# Patient Record
Sex: Male | Born: 1950
Health system: Southern US, Community
[De-identification: ages and names within clinical notes are randomized; demographics above are authoritative.]

## PROBLEM LIST (undated history)

## (undated) DIAGNOSIS — K219 Gastro-esophageal reflux disease without esophagitis: Secondary | ICD-10-CM

## (undated) DIAGNOSIS — G629 Polyneuropathy, unspecified: Secondary | ICD-10-CM

## (undated) DIAGNOSIS — N189 Chronic kidney disease, unspecified: Secondary | ICD-10-CM

## (undated) DIAGNOSIS — C801 Malignant (primary) neoplasm, unspecified: Secondary | ICD-10-CM

## (undated) DIAGNOSIS — M1990 Unspecified osteoarthritis, unspecified site: Secondary | ICD-10-CM

## (undated) DIAGNOSIS — J449 Chronic obstructive pulmonary disease, unspecified: Secondary | ICD-10-CM

## (undated) DIAGNOSIS — M869 Osteomyelitis, unspecified: Secondary | ICD-10-CM

## (undated) DIAGNOSIS — S83511A Sprain of anterior cruciate ligament of right knee, initial encounter: Secondary | ICD-10-CM

## (undated) DIAGNOSIS — D649 Anemia, unspecified: Secondary | ICD-10-CM

## (undated) DIAGNOSIS — E7800 Pure hypercholesterolemia, unspecified: Secondary | ICD-10-CM

## (undated) DIAGNOSIS — C649 Malignant neoplasm of unspecified kidney, except renal pelvis: Secondary | ICD-10-CM

## (undated) DIAGNOSIS — M21372 Foot drop, left foot: Secondary | ICD-10-CM

## (undated) DIAGNOSIS — F419 Anxiety disorder, unspecified: Secondary | ICD-10-CM

## (undated) DIAGNOSIS — Z7189 Other specified counseling: Secondary | ICD-10-CM

## (undated) DIAGNOSIS — D4709 Other mast cell neoplasms of uncertain behavior: Secondary | ICD-10-CM

## (undated) DIAGNOSIS — C3491 Malignant neoplasm of unspecified part of right bronchus or lung: Secondary | ICD-10-CM

## (undated) DIAGNOSIS — R29898 Other symptoms and signs involving the musculoskeletal system: Secondary | ICD-10-CM

## (undated) DIAGNOSIS — I10 Essential (primary) hypertension: Secondary | ICD-10-CM

## (undated) DIAGNOSIS — C61 Malignant neoplasm of prostate: Secondary | ICD-10-CM

## (undated) DIAGNOSIS — J329 Chronic sinusitis, unspecified: Secondary | ICD-10-CM

## (undated) DIAGNOSIS — Z8719 Personal history of other diseases of the digestive system: Secondary | ICD-10-CM

## (undated) HISTORY — DX: Malignant neoplasm of prostate: C61

## (undated) HISTORY — DX: Malignant neoplasm of unspecified kidney, except renal pelvis: C64.9

## (undated) HISTORY — PX: OTHER SURGICAL HISTORY: SHX169

## (undated) HISTORY — PX: BACK SURGERY: SHX140

## (undated) HISTORY — DX: Other mast cell neoplasms of uncertain behavior: D47.09

## (undated) HISTORY — PX: EYE SURGERY: SHX253

## (undated) HISTORY — DX: Other specified counseling: Z71.89

## (undated) HISTORY — PX: PROSTATECTOMY: SHX69

## (undated) HISTORY — PX: VASECTOMY: SHX75

## (undated) HISTORY — PX: FRACTURE SURGERY: SHX138

## (undated) HISTORY — PX: UMBILICAL HERNIA REPAIR: SHX196

## (undated) HISTORY — PX: LEG SURGERY: SHX1003

## (undated) HISTORY — PX: NEPHRECTOMY RADICAL: SUR878

## (undated) HISTORY — DX: Malignant neoplasm of unspecified part of right bronchus or lung: C34.91

## (undated) HISTORY — PX: TYMPANOSTOMY TUBE PLACEMENT: SHX32

---

## 1993-07-30 HISTORY — PX: LUMBAR LAMINECTOMY: SHX95

## 1995-07-31 HISTORY — PX: OTHER SURGICAL HISTORY: SHX169

## 1998-01-05 ENCOUNTER — Encounter: Admission: RE | Admit: 1998-01-05 | Discharge: 1998-04-05 | Payer: Self-pay | Admitting: Internal Medicine

## 1998-04-20 ENCOUNTER — Encounter: Admission: RE | Admit: 1998-04-20 | Discharge: 1998-07-19 | Payer: Self-pay | Admitting: Internal Medicine

## 1998-08-02 ENCOUNTER — Encounter: Admission: RE | Admit: 1998-08-02 | Discharge: 1998-08-12 | Payer: Self-pay | Admitting: Internal Medicine

## 1998-08-12 ENCOUNTER — Encounter: Admission: RE | Admit: 1998-08-12 | Discharge: 1998-11-03 | Payer: Self-pay | Admitting: Internal Medicine

## 1998-11-22 ENCOUNTER — Encounter: Admission: RE | Admit: 1998-11-22 | Discharge: 1999-02-20 | Payer: Self-pay | Admitting: Internal Medicine

## 1999-03-01 ENCOUNTER — Encounter: Admission: RE | Admit: 1999-03-01 | Discharge: 1999-05-30 | Payer: Self-pay | Admitting: Internal Medicine

## 1999-06-07 ENCOUNTER — Encounter: Admission: RE | Admit: 1999-06-07 | Discharge: 1999-09-05 | Payer: Self-pay | Admitting: Internal Medicine

## 1999-06-25 ENCOUNTER — Emergency Department (HOSPITAL_COMMUNITY): Admission: EM | Admit: 1999-06-25 | Discharge: 1999-06-25 | Payer: Self-pay

## 1999-09-19 ENCOUNTER — Encounter: Admission: RE | Admit: 1999-09-19 | Discharge: 1999-12-18 | Payer: Self-pay | Admitting: Internal Medicine

## 2000-01-02 ENCOUNTER — Encounter: Admission: RE | Admit: 2000-01-02 | Discharge: 2000-04-01 | Payer: Self-pay | Admitting: Internal Medicine

## 2000-04-03 ENCOUNTER — Encounter: Admission: RE | Admit: 2000-04-03 | Discharge: 2000-07-02 | Payer: Self-pay | Admitting: Internal Medicine

## 2000-07-03 ENCOUNTER — Encounter: Admission: RE | Admit: 2000-07-03 | Discharge: 2000-08-07 | Payer: Self-pay | Admitting: Internal Medicine

## 2000-07-17 ENCOUNTER — Encounter (HOSPITAL_BASED_OUTPATIENT_CLINIC_OR_DEPARTMENT_OTHER): Payer: Self-pay | Admitting: Internal Medicine

## 2000-08-07 ENCOUNTER — Encounter: Admission: RE | Admit: 2000-08-07 | Discharge: 2000-11-04 | Payer: Self-pay | Admitting: *Deleted

## 2000-10-01 ENCOUNTER — Encounter (INDEPENDENT_AMBULATORY_CARE_PROVIDER_SITE_OTHER): Payer: Self-pay

## 2000-10-01 ENCOUNTER — Ambulatory Visit (HOSPITAL_COMMUNITY): Admission: RE | Admit: 2000-10-01 | Discharge: 2000-10-01 | Payer: Self-pay | Admitting: Gastroenterology

## 2000-11-27 ENCOUNTER — Encounter: Admission: RE | Admit: 2000-11-27 | Discharge: 2001-01-06 | Payer: Self-pay | Admitting: Internal Medicine

## 2000-12-31 ENCOUNTER — Encounter (HOSPITAL_BASED_OUTPATIENT_CLINIC_OR_DEPARTMENT_OTHER): Payer: Self-pay | Admitting: Internal Medicine

## 2001-01-07 ENCOUNTER — Ambulatory Visit (HOSPITAL_COMMUNITY): Admission: RE | Admit: 2001-01-07 | Discharge: 2001-01-07 | Payer: Self-pay | Admitting: Orthopedic Surgery

## 2001-01-07 ENCOUNTER — Encounter: Payer: Self-pay | Admitting: Orthopedic Surgery

## 2001-01-17 ENCOUNTER — Ambulatory Visit (HOSPITAL_COMMUNITY): Admission: RE | Admit: 2001-01-17 | Discharge: 2001-01-17 | Payer: Self-pay | Admitting: Orthopedic Surgery

## 2001-01-28 ENCOUNTER — Encounter: Payer: Self-pay | Admitting: Orthopedic Surgery

## 2001-01-29 ENCOUNTER — Encounter (INDEPENDENT_AMBULATORY_CARE_PROVIDER_SITE_OTHER): Payer: Self-pay | Admitting: Specialist

## 2001-01-29 ENCOUNTER — Observation Stay (HOSPITAL_COMMUNITY): Admission: RE | Admit: 2001-01-29 | Discharge: 2001-01-30 | Payer: Self-pay | Admitting: Orthopedic Surgery

## 2004-07-30 DIAGNOSIS — Z8719 Personal history of other diseases of the digestive system: Secondary | ICD-10-CM

## 2004-07-30 HISTORY — DX: Personal history of other diseases of the digestive system: Z87.19

## 2005-06-29 HISTORY — PX: APPENDECTOMY: SHX54

## 2005-07-01 ENCOUNTER — Inpatient Hospital Stay (HOSPITAL_COMMUNITY): Admission: EM | Admit: 2005-07-01 | Discharge: 2005-07-11 | Payer: Self-pay | Admitting: Emergency Medicine

## 2005-07-01 ENCOUNTER — Encounter (INDEPENDENT_AMBULATORY_CARE_PROVIDER_SITE_OTHER): Payer: Self-pay | Admitting: *Deleted

## 2006-06-10 ENCOUNTER — Ambulatory Visit (HOSPITAL_COMMUNITY): Admission: RE | Admit: 2006-06-10 | Discharge: 2006-06-10 | Payer: Self-pay | Admitting: Orthopedic Surgery

## 2006-07-10 ENCOUNTER — Encounter: Admission: RE | Admit: 2006-07-10 | Discharge: 2006-07-10 | Payer: Self-pay | Admitting: Orthopedic Surgery

## 2006-08-01 ENCOUNTER — Encounter: Admission: RE | Admit: 2006-08-01 | Discharge: 2006-08-01 | Payer: Self-pay | Admitting: Orthopedic Surgery

## 2006-11-28 LAB — HM COLONOSCOPY: HM Colonoscopy: NORMAL

## 2006-12-16 ENCOUNTER — Ambulatory Visit (HOSPITAL_COMMUNITY): Admission: RE | Admit: 2006-12-16 | Discharge: 2006-12-16 | Payer: Self-pay | Admitting: Gastroenterology

## 2007-06-30 ENCOUNTER — Encounter: Admission: RE | Admit: 2007-06-30 | Discharge: 2007-06-30 | Payer: Self-pay | Admitting: Orthopedic Surgery

## 2007-07-16 ENCOUNTER — Encounter: Admission: RE | Admit: 2007-07-16 | Discharge: 2007-07-16 | Payer: Self-pay | Admitting: Neurosurgery

## 2007-10-30 ENCOUNTER — Encounter: Admission: RE | Admit: 2007-10-30 | Discharge: 2007-10-30 | Payer: Self-pay | Admitting: Orthopedic Surgery

## 2008-04-19 ENCOUNTER — Encounter: Admission: RE | Admit: 2008-04-19 | Discharge: 2008-04-19 | Payer: Self-pay | Admitting: Orthopedic Surgery

## 2008-05-03 ENCOUNTER — Encounter: Admission: RE | Admit: 2008-05-03 | Discharge: 2008-05-03 | Payer: Self-pay | Admitting: Orthopedic Surgery

## 2008-07-27 ENCOUNTER — Encounter: Admission: RE | Admit: 2008-07-27 | Discharge: 2008-07-27 | Payer: Self-pay | Admitting: Orthopedic Surgery

## 2010-07-30 HISTORY — PX: MENISCUS REPAIR: SHX5179

## 2010-12-12 NOTE — Op Note (Signed)
NAMENYKEEM, CITRO                 ACCOUNT NO.:  [id number]   MEDICAL RECORD NO.:  [id number]          PATIENT TYPE:  AMB   LOCATION:  ENDO                         FACILITY:  [facility]   PHYSICIAN:  Petra Kuba, M.D.    DATE OF BIRTH:  [birth year]   DATE OF PROCEDURE:  12/16/2006  DATE OF DISCHARGE:                               OPERATIVE REPORT   PROCEDURE:  Colonoscopy   INDICATIONS:  Screening.  Consent was signed after risks, benefits,  methods, options thoroughly discussed multiple times in the past and  with my nurse recently.   MEDICINES USED:  Fentanyl 100 mcg, Versed 10 mg.   PROCEDURE:  Rectal inspection is pertinent for external hemorrhoids,  small.  Digital exam was negative.  The video pediatric colonoscope was  inserted and easily advanced around the colon to the cecum.  This did  not require any abdominal pressure or any positioning changes.  No  abnormality was seen on insertion.  Cecum was identified by the  appendiceal orifice and the ileocecal valve.  Scope was slowly  withdrawn.  The prep was adequate.  There was some liquid stool that  required washing and suctioning.  On slow withdrawal through the colon  no abnormalities were seen.  He did have some difficulty holding air, so  complete insufflation of the sigmoid was difficult but no abnormalities  were seen.  Once back in the rectum, anorectal pull-through and  retroflexion was normal except for some small hemorrhoids.  Scope was  straightened and readvanced a short ways up the left side of the colon.  Air was suctioned and scope removed.  The patient tolerated the  procedure well.  There was no obvious immediate complication.   ENDOSCOPIC DIAGNOSES:  1. Small internal and external hemorrhoids.  2. Otherwise within normal limits to the cecum.   PLAN:  Happy to see back p.r.n.  Repeat colon screening 5-10 years.  Will need to review his pathology from his first colon but I do not  think they were  adenomatous, but I do not have them available at the  time of this dictation to help [place] decide when he needs to be rescreened.           ______________________________  Petra Kuba, M.D.     MEM/MEDQ  D:  12/16/2006  T:  12/16/2006  Job:  [id number]

## 2010-12-15 NOTE — Procedures (Signed)
[facility]  Patient:    Taylor Elliott, Taylor Elliott                        MRN: [id number] Proc. Date: 10/01/00 Adm. Date:  [id number] Attending:  Nelda Marseille CC:         Lilyan Punt. Sydnee Levans, M.D.   Procedure Report  PROCEDURE:  Colonoscopy with biopsy.  INDICATIONS FOR PROCEDURE:  A patient with diarrhea, some bright red blood.  Consent was signed after risks, benefits, methods, and options were thoroughly discussed in the office.  MEDICINES USED:  Demerol 100, Versed 10.  DESCRIPTION OF PROCEDURE:  Rectal inspection is pertinent for very small external hemorrhoids. Digital exam was negative. The video colonoscope was inserted, easily advanced around the colon to the cecum. This did not require any abdominal pressure or any position changes. The cecum was identified by the appendiceal orifice and the ileocecal valve. The prep was adequate. No obvious abnormality was seen on insertion. The scope was inserted a short ways into the terminal ileum which was normal. Photo documentation was obtained. Scattered random biopsies of both the TI and the colon on withdrawal were obtained and put in separate containers. On slow withdrawal through the colon, no abnormalities were seen as we withdrew back to the distal sigmoid except for a tiny distal sigmoid probably hyperplastic appearing polyp and small rectal probably hyperplastic appearing polyp both of which were cold biopsied and put in a separate container. No other abnormalities were seen. There was some spasm on the left side, minimal liquid stool that required washing and suctioning but no other problems. Back in the rectum, we retroflexed pertinent for some small internal hemorrhoids. The scope was straightened and with suction the scope removed. The patient tolerated the procedure well and there was no obvious or immediate complication.  ENDOSCOPIC DIAGNOSIS: 1. Tiny to small internal/external  hemorrhoids. 2. Two rectal and distal sigmoid tiny questionable polyps status post cold    biopsy. 3. Otherwise within normal limits to the terminal ileum status post random    biopsies throughout.  PLAN:  Await pathology. Follow-up p.r.n. or in six weeks to recheck symptoms and decide any further workup plans like possibly an upper GI small bowel follow-through. Also await pathology on the polyps to determine future colonic screening. DD:  10/01/00 TD:  10/01/00 Job: [id number]

## 2010-12-15 NOTE — Op Note (Signed)
[facility]  Patient:    Taylor Elliott, Taylor Elliott                          MRN: [id number] Proc. Date: 01/29/01 Attending:  Nadara Mustard, M.D.                           Operative Report  PREOPERATIVE DIAGNOSES:  Osteomyelitis, left fifth metatarsal head with chronic Wagner grade 3 ulceration.  PROCEDURE:  Left fifth ray amputation.  SURGEON:  Dr. Lajoyce Corners.  ANESTHESIA:  General LMA.  ESTIMATED BLOOD LOSS:  Minimal.  ANTIBIOTICS:  1 gm of Kefzol.  TOURNIQUET TIME:  Esmarch at the ankle for approximately 20 minutes.  DISPOSITION:  To PACU in stable condition. Toe sent to pathology for identification.  INDICATIONS FOR PROCEDURE:  The patient is a 60 year old gentleman with a peripheral neuropathy secondary to spinal surgery as a child with a spinal angioma excision in 1958. The patient has had multiple foot procedures due to neuromuscular imbalance and has had a chronic ulceration over the left foot metatarsal head with infection of the bone. The patient has failed conservative care including p.o. antibiotics and wound debridement. Studies are positive for osteomyelitis in the left fifth metatarsal head. The patient presents at this time for left fifth ray amputation. The risks and benefits were discussed including infection, neurovascular injury, persistent infection, need for additional surgery, nonhealing of the wound. The patient states he understands and wishes to proceed at this time.  DESCRIPTION OF PROCEDURE:  The patient was brought to outpatient OR and underwent a general LMA anesthetic. After adequate levels of anesthesia obtained, the patients left lower extremity was prepped using duraprep and draped as a sterile field. A stockinette covered all exposed skin. A tourniquet was used with an Esmarch and the  Esmarch was wrapped around the ankle after elevation of the leg and the Esmarch was used for the tourniquet control. A racquet incision was made  laterally over the fifth metatarsal to include the Wagner grade 3 ulcer. The bone and ulcer and soft tissue were resected in one segment. The fifth metatarsal osteotomy was cut just distal to the insertion of the peroneal brevis and this was beveled plantarly to minimize risk of ulceration. The wound was irrigated with normal saline, there was no purulence. The tissue surrounding the bone was healthy. The Esmarch was released, hemostasis was obtained. The wound was closed using a vertical mattress in a far near, near far stitch. The wound was covered with Adaptic, orthopedic sponges, sterile Webril and a loosely wrapped Coban. The patient was extubated and taken to PACU in stable condition. Antibiotics and 1 gm of Kefzol preoperatively. Tourniquet time approximately 20 minutes with the Esmarch at the ankle. Discharged to PACU in stable condition and plan for 23 hour observation with IV antibiotics and then discharge to home, nonweightbearing, and follow-up in the office in one week. DD:  01/29/01 TD:  01/29/01 Job: [id number]

## 2010-12-15 NOTE — Op Note (Signed)
Taylor Elliott, Taylor Elliott                 ACCOUNT NO.:  [id number]   MEDICAL RECORD NO.:  [id number]          PATIENT TYPE:  INP   LOCATION:  0101                         FACILITY:  [facility]   PHYSICIAN:  Lebron Conners, M.D.   DATE OF BIRTH:  1950-11-25   DATE OF PROCEDURE:  07/01/2005  DATE OF DISCHARGE:                                 OPERATIVE REPORT   PREOPERATIVE DIAGNOSIS:  Acute appendicitis.   POSTOPERATIVE DIAGNOSIS:  Acute appendicitis with perforation.   OPERATION:  Laparoscopic appendectomy.   SURGEON:  Dr. Lebron Conners.   ANESTHESIA:  General and local.   SPECIMEN:  Appendix.   BLOOD LOSS:  Minimal.   COMPLICATIONS:  None.   CONDITION:  To PACU good.   DESCRIPTION OF PROCEDURE:  After the patient was monitored and anesthetized  and had a Foley catheter and routine preparation and draping of the abdomen,  I made a short vertical incision just below the umbilicus through a spot  which I thoroughly anesthetized with local anesthetic. I incised the fascia  in the midline for about 2 cm and then bluntly entered the peritoneal  cavity. I secured a Hassan cannula with a #0 Vicryl pursestring suture in  the fascia and inflated the abdomen with carbon dioxide. When I put in the  camera I could see evidence of inflammation in the right lower quadrant. I  then put in a 5 mm right upper quadrant port and a 11 mm lower midline port  under direct view assuring no visceral injury. I put these in through  anesthetized sites. I then retracted the colon medially and cleared adherent  omentum away from it and found inflamed appendix adherent to the lateral  pelvic wall. As I mobilized it, I was able to grasp it with a ratchet  grasper and elevate it and I could see that it was gangrenous and perforated  well away from the base. The inflammation was very well localized in that  area there being no detectable free fluid in the right gutter or pelvis. I  then dissected the mesentery,  dividing the fat with the cautery and I saw  the appendiceal artery and ligated it with three clips and cut between the  two closer to the appendix. I then stapled across the base of the appendix  where it appeared to be healthy utilizing the endoscopic cutting stapler and  it made a nice amputation of the appendix. I then irrigated the area,  thoroughly inspected the closure and was satisfied with it. I removed the  irrigant and was satisfied with hemostasis. I placed the appendix in a  plastic pouch and removed it  through the umbilical incision and tied the pursestring suture. I then  removed the right upper quadrant port under direct view and noted no  bleeding from the abdominal wall. I allowed the carbon dioxide to escape and  removed the lower midline port. I closed all skin incisions with  intracuticular 4-0 Vicryl and Steri-Strips.      Lebron Conners, M.D.  Electronically Signed     WB/MEDQ  D:  07/01/2005  T:  07/02/2005  Job:  [id number]

## 2010-12-15 NOTE — Discharge Summary (Signed)
Taylor Elliott, BUFANO                 ACCOUNT NO.:  [id number]   MEDICAL RECORD NO.:  [id number]          PATIENT TYPE:  INP   LOCATION:  1504                         FACILITY:  [facility]   PHYSICIAN:  Lebron Conners, M.D.   DATE OF BIRTH:  [birth year]   DATE OF ADMISSION:  07/01/2005  DATE OF DISCHARGE:  07/11/2005                                 DISCHARGE SUMMARY   HISTORY:  This is a 60 year old white male who presented at the emergency  department with abdominal pain which had first been central and then moved  to the right lower quadrant. He thought it was for about 2 days. He had also  had fever, nausea and vomiting and one episode of diarrhea.   PAST HISTORY:  Remarkable for an idiopathic neuropathy. 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  He is not diabetic. He has no other serious chronic problems. He smokes a  pack a day.   PHYSICAL EXAM:  Temperature was 101, pulse 117, respirations 25. Blood  pressure was normal. He was very tender in the right lower quadrant and was  slightly distended and belly was quiet. Except for a left fifth ray  amputation, the remainder of his exam was unremarkable. White count was  16,200, hemoglobin 14.9.   HOSPITAL COURSE:  The patient was felt to have acute appendicitis. I took  him to the operating room and found that he did have acute appendicitis with  perforation and localized rather severe inflammation but I was able to  perform a laparoscopic appendectomy. Postoperatively, he had slow return of  gastrointestinal function. White count was up for a short while and then  became normal. He had no obvious infectious complications. He did have  however vomiting which was quite remarkable and abdominal x-ray showing  ileus versus obstruction. His obstruction was never complete and he had some  associated diarrhea. His distension gradually resolved. A CT scan showed  partial obstruction in  picture. He was treated with some antibiotics and  those were stopped and his diarrhea improved. Clostridium difficile toxin  was negative. When the patient was tolerating a general diet without  vomiting and his abdomen was soft and bowels were under control, I sent home  with a prescription for Vicodin and arrangements to see me in 2 or 3 weeks  or as necessary.   DIAGNOSIS:  1.  Acute appendicitis with perforation and localized abscess.  2.  Postoperative small intestinal obstruction, improved.  3.  Postoperative diarrhea, improved.  4.  Idiopathic peripheral neuropathy.  5.  Tobacco abuse.   OPERATION:  Laparoscopic appendectomy.   DISCHARGE CONDITION:  Stable and improving.      Lebron Conners, M.D.  Electronically Signed     WB/MEDQ  D:  07/17/2005  T:  07/19/2005  Job:  [id number]

## 2011-04-17 ENCOUNTER — Ambulatory Visit
Admission: RE | Admit: 2011-04-17 | Discharge: 2011-04-17 | Disposition: A | Discharge status: Home / Self Care | Payer: Commercial Managed Care - PPO | Source: Ambulatory Visit | Attending: Orthopedic Surgery | Admitting: Orthopedic Surgery

## 2011-04-17 ENCOUNTER — Other Ambulatory Visit (HOSPITAL_COMMUNITY): Payer: Self-pay | Admitting: Orthopedic Surgery

## 2011-04-17 DIAGNOSIS — M25561 Pain in right knee: Secondary | ICD-10-CM

## 2011-04-18 ENCOUNTER — Other Ambulatory Visit: Payer: Self-pay | Admitting: Orthopedic Surgery

## 2011-04-18 DIAGNOSIS — M25561 Pain in right knee: Secondary | ICD-10-CM

## 2011-04-23 ENCOUNTER — Encounter (HOSPITAL_COMMUNITY)
Admission: RE | Admit: 2011-04-23 | Discharge: 2011-04-23 | Disposition: A | Discharge status: Home / Self Care | Payer: No Typology Code available for payment source | Source: Ambulatory Visit | Attending: Orthopedic Surgery | Admitting: Orthopedic Surgery

## 2011-04-23 ENCOUNTER — Other Ambulatory Visit (HOSPITAL_COMMUNITY): Payer: Self-pay | Admitting: Orthopedic Surgery

## 2011-04-23 DIAGNOSIS — S83209A Unspecified tear of unspecified meniscus, current injury, unspecified knee, initial encounter: Secondary | ICD-10-CM

## 2011-04-23 LAB — COMPREHENSIVE METABOLIC PANEL
ALT: 30 U/L (ref 0–53)
AST: 19 U/L (ref 0–37)
Albumin: 3.5 g/dL (ref 3.5–5.2)
Alkaline Phosphatase: 90 U/L (ref 39–117)
BUN: 16 mg/dL (ref 6–23)
CO2: 26 mEq/L (ref 19–32)
Calcium: 9.7 mg/dL (ref 8.4–10.5)
Chloride: 105 mEq/L (ref 96–112)
Creatinine, Ser: 0.81 mg/dL (ref 0.50–1.35)
GFR calc Af Amer: >60 mL/min (ref >60)
GFR calc non Af Amer: >60 mL/min (ref >60)
Glucose, Bld: 112 mg/dL — ABNORMAL HIGH (ref 70–99)
Potassium: 5.6 mEq/L — ABNORMAL HIGH (ref 3.5–5.1)
Sodium: 142 mEq/L (ref 135–145)
Total Bilirubin: 0.3 mg/dL (ref 0.3–1.2)
Total Protein: 6.4 g/dL (ref 6.0–8.3)

## 2011-04-23 LAB — PROTIME-INR
INR: 0.95 (ref 0.00–1.49)
Prothrombin Time: 12.9 seconds (ref 11.6–15.2)

## 2011-04-23 LAB — CBC
HCT: 41.7 % (ref 39.0–52.0)
Hemoglobin: 14.4 g/dL (ref 13.0–17.0)
MCH: 32.7 pg (ref 26.0–34.0)
MCHC: 34.5 g/dL (ref 30.0–36.0)
MCV: 94.6 fL (ref 78.0–100.0)
Platelets: 209 10*3/uL (ref 150–400)
RBC: 4.41 MIL/uL (ref 4.22–5.81)
RDW: 14.1 % (ref 11.5–15.5)
WBC: 8.1 10*3/uL (ref 4.0–10.5)

## 2011-04-23 LAB — APTT: aPTT: 30 seconds (ref 24–37)

## 2011-04-23 LAB — SURGICAL PCR SCREEN
MRSA, PCR: NEGATIVE
Staphylococcus aureus: NEGATIVE

## 2011-04-25 ENCOUNTER — Ambulatory Visit (HOSPITAL_COMMUNITY)
Admission: RE | Admit: 2011-04-25 | Discharge: 2011-04-25 | Disposition: A | Discharge status: Home / Self Care | Payer: No Typology Code available for payment source | Source: Ambulatory Visit | Attending: Orthopedic Surgery | Admitting: Orthopedic Surgery

## 2011-04-25 DIAGNOSIS — F17200 Nicotine dependence, unspecified, uncomplicated: Secondary | ICD-10-CM | POA: Insufficient documentation

## 2011-04-25 DIAGNOSIS — M545 Low back pain, unspecified: Secondary | ICD-10-CM | POA: Insufficient documentation

## 2011-04-25 DIAGNOSIS — S83509A Sprain of unspecified cruciate ligament of unspecified knee, initial encounter: Secondary | ICD-10-CM | POA: Insufficient documentation

## 2011-04-25 DIAGNOSIS — Z01818 Encounter for other preprocedural examination: Secondary | ICD-10-CM | POA: Insufficient documentation

## 2011-04-25 DIAGNOSIS — S83289A Other tear of lateral meniscus, current injury, unspecified knee, initial encounter: Secondary | ICD-10-CM | POA: Insufficient documentation

## 2011-04-25 DIAGNOSIS — K219 Gastro-esophageal reflux disease without esophagitis: Secondary | ICD-10-CM | POA: Insufficient documentation

## 2011-04-25 DIAGNOSIS — Y998 Other external cause status: Secondary | ICD-10-CM | POA: Insufficient documentation

## 2011-04-25 DIAGNOSIS — I10 Essential (primary) hypertension: Secondary | ICD-10-CM | POA: Insufficient documentation

## 2011-04-25 DIAGNOSIS — Z01812 Encounter for preprocedural laboratory examination: Secondary | ICD-10-CM | POA: Insufficient documentation

## 2011-04-25 LAB — BASIC METABOLIC PANEL
BUN: 13 mg/dL (ref 6–23)
CO2: 30 mEq/L (ref 19–32)
Calcium: 10.3 mg/dL (ref 8.4–10.5)
Chloride: 100 mEq/L (ref 96–112)
Creatinine, Ser: 0.79 mg/dL (ref 0.50–1.35)
GFR calc Af Amer: >60 mL/min (ref >60)
GFR calc non Af Amer: >60 mL/min (ref >60)
Glucose, Bld: 125 mg/dL — ABNORMAL HIGH (ref 70–99)
Potassium: 5.7 mEq/L — ABNORMAL HIGH (ref 3.5–5.1)
Sodium: 140 mEq/L (ref 135–145)

## 2011-05-15 ENCOUNTER — Ambulatory Visit: Payer: No Typology Code available for payment source | Attending: Orthopedic Surgery | Admitting: Physical Therapy

## 2011-05-15 DIAGNOSIS — IMO0001 Reserved for inherently not codable concepts without codable children: Secondary | ICD-10-CM | POA: Insufficient documentation

## 2011-05-15 DIAGNOSIS — M25569 Pain in unspecified knee: Secondary | ICD-10-CM | POA: Insufficient documentation

## 2011-05-15 DIAGNOSIS — M25669 Stiffness of unspecified knee, not elsewhere classified: Secondary | ICD-10-CM | POA: Insufficient documentation

## 2011-05-15 DIAGNOSIS — R262 Difficulty in walking, not elsewhere classified: Secondary | ICD-10-CM | POA: Insufficient documentation
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
## 2011-05-24 ENCOUNTER — Ambulatory Visit: Payer: No Typology Code available for payment source | Admitting: Physical Therapy

## 2011-06-01 ENCOUNTER — Ambulatory Visit: Payer: No Typology Code available for payment source | Attending: Orthopedic Surgery | Admitting: Physical Therapy

## 2011-06-01 DIAGNOSIS — M25569 Pain in unspecified knee: Secondary | ICD-10-CM | POA: Insufficient documentation

## 2011-06-01 DIAGNOSIS — IMO0001 Reserved for inherently not codable concepts without codable children: Secondary | ICD-10-CM | POA: Insufficient documentation

## 2011-06-01 DIAGNOSIS — R262 Difficulty in walking, not elsewhere classified: Secondary | ICD-10-CM | POA: Insufficient documentation

## 2011-06-01 DIAGNOSIS — M25669 Stiffness of unspecified knee, not elsewhere classified: Secondary | ICD-10-CM | POA: Insufficient documentation

## 2011-10-17 HISTORY — PX: NM MYOCAR PERF EJECTION FRACTION: HXRAD630

## 2012-07-24 DIAGNOSIS — M109 Gout, unspecified: Secondary | ICD-10-CM | POA: Insufficient documentation

## 2013-01-08 ENCOUNTER — Other Ambulatory Visit: Payer: Self-pay | Admitting: *Deleted

## 2013-01-08 MED ORDER — HYDROCHLOROTHIAZIDE 25 MG PO TABS: 25.0000 mg | ORAL_TABLET | Freq: Every day | ORAL | Status: DC

## 2013-06-12 DIAGNOSIS — G609 Hereditary and idiopathic neuropathy, unspecified: Secondary | ICD-10-CM | POA: Insufficient documentation

## 2013-06-12 DIAGNOSIS — I10 Essential (primary) hypertension: Secondary | ICD-10-CM | POA: Insufficient documentation

## 2013-06-12 DIAGNOSIS — N529 Male erectile dysfunction, unspecified: Secondary | ICD-10-CM | POA: Insufficient documentation

## 2013-06-12 DIAGNOSIS — E7800 Pure hypercholesterolemia, unspecified: Secondary | ICD-10-CM | POA: Insufficient documentation

## 2013-07-14 ENCOUNTER — Other Ambulatory Visit (HOSPITAL_COMMUNITY): Payer: Self-pay | Admitting: Urology

## 2013-07-14 DIAGNOSIS — C61 Malignant neoplasm of prostate: Secondary | ICD-10-CM

## 2013-07-15 ENCOUNTER — Other Ambulatory Visit: Payer: Self-pay | Admitting: Urology

## 2013-08-17 ENCOUNTER — Encounter (INDEPENDENT_AMBULATORY_CARE_PROVIDER_SITE_OTHER): Payer: Self-pay

## 2013-08-17 ENCOUNTER — Encounter (HOSPITAL_COMMUNITY): Payer: Self-pay

## 2013-08-17 ENCOUNTER — Encounter (HOSPITAL_COMMUNITY)
Admission: RE | Admit: 2013-08-17 | Discharge: 2013-08-17 | Disposition: A | Discharge status: Home / Self Care | Payer: 59 | Source: Ambulatory Visit | Attending: Urology | Admitting: Urology

## 2013-08-17 ENCOUNTER — Encounter (HOSPITAL_COMMUNITY): Payer: Self-pay | Admitting: Pharmacy Technician

## 2013-08-17 HISTORY — DX: Polyneuropathy, unspecified: G62.9

## 2013-08-17 HISTORY — DX: Essential (primary) hypertension: I10

## 2013-08-17 HISTORY — DX: Unspecified osteoarthritis, unspecified site: M19.90

## 2013-08-17 HISTORY — DX: Malignant (primary) neoplasm, unspecified: C80.1

## 2013-08-17 HISTORY — DX: Other symptoms and signs involving the musculoskeletal system: R29.898

## 2013-08-17 HISTORY — DX: Pure hypercholesterolemia, unspecified: E78.00

## 2013-08-17 HISTORY — DX: Gastro-esophageal reflux disease without esophagitis: K21.9

## 2013-08-17 HISTORY — DX: Personal history of other diseases of the digestive system: Z87.19

## 2013-08-17 HISTORY — DX: Sprain of anterior cruciate ligament of right knee, initial encounter: S83.511A

## 2013-08-17 HISTORY — DX: Foot drop, left foot: M21.372

## 2013-08-17 LAB — BASIC METABOLIC PANEL
BUN: 11 mg/dL (ref 6–23)
CO2: 27 mEq/L (ref 19–32)
Calcium: 9.9 mg/dL (ref 8.4–10.5)
Chloride: 98 mEq/L (ref 96–112)
Creatinine, Ser: 0.9 mg/dL (ref 0.50–1.35)
GFR calc Af Amer: >90 mL/min (ref >90)
GFR calc non Af Amer: >90 mL/min (ref >90)
Glucose, Bld: 110 mg/dL — ABNORMAL HIGH (ref 70–99)
Potassium: 5.1 mEq/L (ref 3.7–5.3)
Sodium: 138 mEq/L (ref 137–147)

## 2013-08-17 LAB — CBC
HCT: 46 % (ref 39.0–52.0)
Hemoglobin: 15.8 g/dL (ref 13.0–17.0)
MCH: 31.9 pg (ref 26.0–34.0)
MCHC: 34.3 g/dL (ref 30.0–36.0)
MCV: 92.9 fL (ref 78.0–100.0)
Platelets: 224 10*3/uL (ref 150–400)
RBC: 4.95 MIL/uL (ref 4.22–5.81)
RDW: 13.2 % (ref 11.5–15.5)
WBC: 8.6 10*3/uL (ref 4.0–10.5)

## 2013-08-17 NOTE — Patient Instructions (Addendum)
[place]  08/17/2013   Your procedure is scheduled on: 08/27/13  Report to [facility] at 5:15 AM.  Call this number if you have problems the morning of surgery [phone]: [phone]   Remember: please follow bowel prep instructions   Do not eat food or drink liquids After Midnight.     Take these medicines the morning of surgery with A SIP OF WATER: crestor, allopurinol   Do not wear jewelry, make-up or nail polish.  Do not wear lotions, powders, or perfumes. You may wear deodorant.  Do not shave 48 hours prior to surgery. Men may shave face and neck.  Do not bring valuables to the hospital.  Contacts, dentures or bridgework may not be worn into surgery.  Leave suitcase in the car. After surgery it may be brought to your room.  For patients admitted to the hospital, checkout time is 11:00 AM the day of discharge.    Please read over the following fact sheets that you were given: blood fact sheet, incentive spirometry fact sheet Paulette Blanch, RN  pre op nurse call if needed [phone]    FAILURE TO [place]   Patient Signature: ___________________________________________

## 2013-08-17 NOTE — Progress Notes (Signed)
08/17/13 0852  OBSTRUCTIVE SLEEP APNEA  Have you ever been diagnosed with sleep apnea through a sleep study? No  Do you snore loudly (loud enough to be heard through closed doors)?  1  Do you often feel tired, fatigued, or sleepy during the daytime? 0  Has anyone observed you stop breathing during your sleep? 0  Do you have, or are you being treated for high blood pressure? 1  BMI more than 35 kg/m2? 0  Age over 62 years old? 1  Neck circumference greater than 40 cm/18 inches? 0  Gender: 1  Obstructive Sleep Apnea Score 4  Score 4 or greater  Results sent to PCP

## 2013-08-17 NOTE — Progress Notes (Signed)
LOV note Dr. Ellyn Hack 11/05/12 on chart, EKG 11/04/12 on chart, stress test 2013 on chart, LOV note 06/12/13 Dr. Coletta Memos on chart, Chest x-ray 06/16/13 on chart

## 2013-08-19 ENCOUNTER — Ambulatory Visit (HOSPITAL_COMMUNITY)
Admission: RE | Admit: 2013-08-19 | Discharge: 2013-08-19 | Disposition: A | Discharge status: Home / Self Care | Payer: 59 | Source: Ambulatory Visit | Attending: Urology | Admitting: Urology

## 2013-08-19 DIAGNOSIS — R972 Elevated prostate specific antigen [PSA]: Secondary | ICD-10-CM | POA: Insufficient documentation

## 2013-08-19 DIAGNOSIS — N402 Nodular prostate without lower urinary tract symptoms: Secondary | ICD-10-CM | POA: Insufficient documentation

## 2013-08-19 DIAGNOSIS — C61 Malignant neoplasm of prostate: Secondary | ICD-10-CM | POA: Insufficient documentation

## 2013-08-19 LAB — POCT I-STAT CREATININE: Creatinine, Ser: 1 mg/dL (ref 0.50–1.35)

## 2013-08-19 MED ORDER — GADOBENATE DIMEGLUMINE 529 MG/ML IV SOLN
19.0000 mL | Freq: Once | INTRAVENOUS | Status: AC | PRN
  Administered 2013-08-19: 19 mL via INTRAVENOUS

## 2013-08-24 ENCOUNTER — Other Ambulatory Visit (HOSPITAL_COMMUNITY): Payer: 59

## 2013-08-26 NOTE — H&P (Signed)
Chief Complaint Prostate Cancer    History of Present Illness Taylor Elliott is a 63 year old who was noted to have an elevated PSA of 4.12 and a right mid prostate nodule. He underwent a prostate needle biopsy on 07/02/13 which confirmed Gleason 3+4=7 adenocarcinoma of the prostate with 10 out of 12 biopsy cores positive for malignancy. He has no family history of prostate cancer. He is well informed about his treatment options through his prior discussions with Dr. Diona Fanti. He is most interested in proceeding with surgical treatment.  His past surgical history is significant for an appendectomy.  TNM stage: cT2a Nx Mx (R mid nodule) PSA: 4.12 Gleason score: 3+4=7 Prostate biopsy (07/02/13): 10/12 cores positive    Left: L lateral apex (60%, 3+4=7), L lateral mid (70%, 3+4=7), L mid (40%, 3+3=6), L lateral base (40%, 3+3=6)    Right: R apex (< 5%, 3+3=6), R lateral apex (50%, 3+4=7), R mid (70%, 3+4=7), R lateral mid (80%, 3+3=6), R base (50%, 3+4=7), R lateral base (70%, 3+4=7)  Prostate volume: 15 cc   Nomogram OC disease: 74% EPE: 29% SVI: 1% LNI: 2.2% PFS (surgery): 90% at 5 years, 86% at 10 years  Urinary function: He has moderate voiding symptoms including frequency, intermittency, and urgency. IPSS is 8. Erectile function: He has very mild erectile dysfunction which has not required treatment. SHIM score is 21. He does have issues related to a low libido which has been somewhat long-standing.   Past Medical History Problems  1. History of Anxiety (300.00) 2. History of esophageal reflux (V12.79) 3. History of gout (V12.29) 4. History of hypertension (V12.59)  Surgical History Problems  1. History of Appendectomy 2. History of Back Surgery 3. Encounter for contraceptive planning (V25.09) 4. History of Foot Surgery 5. History of Knee Arthroscopy With Medial Meniscus Repair 6. History of Leg Repair 7. History of Surgery Of Male Genitalia Vasectomy  Current Meds 1.  Allopurinol 100 MG Oral Tablet;  Therapy: (Recorded:21Nov2014) to Recorded 2. Colcrys 0.6 MG Oral Tablet;  Therapy: (Recorded:21Nov2014) to Recorded 3. Crestor 10 MG Oral Tablet;  Therapy: (Recorded:21Nov2014) to Recorded 4. Imodium CAPS;  Therapy: (Recorded:21Nov2014) to Recorded 5. Levofloxacin 500 MG Oral Tablet; take one tab by mouth the day before procedure, take  one tab by mouth the day of procedure, take one tab by mouth the after the procedure;  Therapy: [id number] to (Last Rx:21Nov2014)  Requested for: 21Nov2014 Ordered 6. Lisinopril 20 MG Oral Tablet;  Therapy: (Recorded:21Nov2014) to Recorded 7. L-Methylfolate-B6-B12 TABS;  Therapy: (Recorded:21Nov2014) to Recorded 8. Lovaza 1 GM Oral Capsule;  Therapy: (Recorded:21Nov2014) to Recorded 9. Multi-Vitamin TABS;  Therapy: (Recorded:20May2009) to Recorded 10. Tums CHEW;   Therapy: (Recorded:21Nov2014) to Recorded 11. Vicodin TABS; prn;   Therapy: (Recorded:21Nov2014) to Recorded  Allergies Medication  1. No Known Drug Allergies Non-Medication  2. Bee sting  Family History Problems  1. Family history of Death In The Family Mother : Mother   died age 96-lung cancer 2. Family history of Family Health Status Number Of Children   1 boy and 1 girl 3. Family history of Nephrolithiasis : Father  Social History Problems    Alcohol Use   3 beers/day   Current every day smoker (305.1)   smoke 1 ppd   Marital History - Currently Married   Occupation:   self employed-sales  Review of Systems Constitutional, skin, eye, otolaryngeal, hematologic/lymphatic, cardiovascular, pulmonary, endocrine, musculoskeletal, gastrointestinal, neurological and psychiatric system(s) were reviewed and pertinent findings if present are noted.  Vitals  Weight: 190 lb  BMI Calculated: 28.89 BSA Calculated: 2   Physical Exam Constitutional: Well nourished and well developed . No acute distress.  ENT:. The ears and nose are  normal in appearance.  Neck: The appearance of the neck is normal and no neck mass is present.  Pulmonary: No respiratory distress, normal respiratory rhythm and effort and clear bilateral breath sounds.  Cardiovascular: Heart rate and rhythm are normal . No peripheral edema.  Abdomen: right lower quadrant incision site(s) well healed. The abdomen is soft and nontender. No masses are palpated. No CVA tenderness. No hernias are palpable. No hepatosplenomegaly noted.      Assessment Assessed  1. Adenocarcinoma of prostate (185)   Discussion/Summary 1. Prostate cancer:    He has elected to proceed with surgical therapy and will undergo a non nerve sparing robotic-assisted laparoscopic radical prostatectomy and bilateral pelvic lymphadenectomy.

## 2013-08-27 ENCOUNTER — Inpatient Hospital Stay (HOSPITAL_COMMUNITY): Payer: 59 | Admitting: Anesthesiology

## 2013-08-27 ENCOUNTER — Encounter (HOSPITAL_COMMUNITY): Payer: 59 | Admitting: Anesthesiology

## 2013-08-27 ENCOUNTER — Inpatient Hospital Stay (HOSPITAL_COMMUNITY)
Admission: RE | Admit: 2013-08-27 | Discharge: 2013-08-28 | DRG: 708 | Disposition: A | Discharge status: Home / Self Care | Payer: 59 | Source: Ambulatory Visit | Attending: Urology | Admitting: Urology

## 2013-08-27 ENCOUNTER — Encounter (HOSPITAL_COMMUNITY): Payer: Self-pay | Admitting: *Deleted

## 2013-08-27 ENCOUNTER — Encounter (HOSPITAL_COMMUNITY)
Admission: RE | Disposition: A | Discharge status: Home / Self Care | Payer: Self-pay | Source: Ambulatory Visit | Attending: Urology

## 2013-08-27 DIAGNOSIS — R29898 Other symptoms and signs involving the musculoskeletal system: Secondary | ICD-10-CM | POA: Diagnosis not present

## 2013-08-27 DIAGNOSIS — I10 Essential (primary) hypertension: Secondary | ICD-10-CM | POA: Diagnosis present

## 2013-08-27 DIAGNOSIS — F17200 Nicotine dependence, unspecified, uncomplicated: Secondary | ICD-10-CM | POA: Diagnosis present

## 2013-08-27 DIAGNOSIS — K219 Gastro-esophageal reflux disease without esophagitis: Secondary | ICD-10-CM | POA: Diagnosis present

## 2013-08-27 DIAGNOSIS — Z9089 Acquired absence of other organs: Secondary | ICD-10-CM

## 2013-08-27 DIAGNOSIS — R262 Difficulty in walking, not elsewhere classified: Secondary | ICD-10-CM | POA: Diagnosis not present

## 2013-08-27 DIAGNOSIS — Z801 Family history of malignant neoplasm of trachea, bronchus and lung: Secondary | ICD-10-CM

## 2013-08-27 DIAGNOSIS — Z01812 Encounter for preprocedural laboratory examination: Secondary | ICD-10-CM

## 2013-08-27 DIAGNOSIS — G5780 Other specified mononeuropathies of unspecified lower limb: Secondary | ICD-10-CM | POA: Diagnosis not present

## 2013-08-27 DIAGNOSIS — M109 Gout, unspecified: Secondary | ICD-10-CM | POA: Diagnosis present

## 2013-08-27 DIAGNOSIS — R6882 Decreased libido: Secondary | ICD-10-CM | POA: Diagnosis present

## 2013-08-27 DIAGNOSIS — N529 Male erectile dysfunction, unspecified: Secondary | ICD-10-CM | POA: Diagnosis present

## 2013-08-27 DIAGNOSIS — F411 Generalized anxiety disorder: Secondary | ICD-10-CM | POA: Diagnosis present

## 2013-08-27 DIAGNOSIS — C61 Malignant neoplasm of prostate: Principal | ICD-10-CM | POA: Diagnosis present

## 2013-08-27 HISTORY — PX: ROBOT ASSISTED LAPAROSCOPIC RADICAL PROSTATECTOMY: SHX5141

## 2013-08-27 HISTORY — PX: LYMPHADENECTOMY: SHX5960

## 2013-08-27 LAB — TYPE AND SCREEN
ABO/RH(D): A POS
Antibody Screen: NEGATIVE

## 2013-08-27 LAB — HEMOGLOBIN AND HEMATOCRIT, BLOOD
HCT: 41.1 % (ref 39.0–52.0)
Hemoglobin: 13.8 g/dL (ref 13.0–17.0)

## 2013-08-27 LAB — ABO/RH: ABO/RH(D): A POS

## 2013-08-27 SURGERY — ROBOTIC ASSISTED LAPAROSCOPIC RADICAL PROSTATECTOMY LEVEL 2
Anesthesia: General

## 2013-08-27 MED ORDER — ALBUTEROL SULFATE HFA 108 (90 BASE) MCG/ACT IN AERS
INHALATION_SPRAY | RESPIRATORY_TRACT | Status: DC | PRN
  Administered 2013-08-27: 2 via RESPIRATORY_TRACT
  Administered 2013-08-27: 3 via RESPIRATORY_TRACT

## 2013-08-27 MED ORDER — KCL IN DEXTROSE-NACL 20-5-0.45 MEQ/L-%-% IV SOLN
INTRAVENOUS | Status: DC
Start: 2013-08-27 — End: 2013-08-28
  Administered 2013-08-27 – 2013-08-28 (×3): via INTRAVENOUS
  Filled 2013-08-27 (×5): qty 1000

## 2013-08-27 MED ORDER — HYDROMORPHONE HCL PF 1 MG/ML IJ SOLN
INTRAMUSCULAR | Status: AC
  Filled 2013-08-27: qty 1

## 2013-08-27 MED ORDER — HYDROMORPHONE HCL PF 1 MG/ML IJ SOLN
INTRAMUSCULAR | Status: DC | PRN
  Administered 2013-08-27 (×2): 1 mg via INTRAVENOUS

## 2013-08-27 MED ORDER — HYDROCODONE-ACETAMINOPHEN 5-325 MG PO TABS: 1.0000 | ORAL_TABLET | Freq: Four times a day (QID) | ORAL | Status: DC | PRN

## 2013-08-27 MED ORDER — GLYCOPYRROLATE 0.2 MG/ML IJ SOLN
INTRAMUSCULAR | Status: AC
  Filled 2013-08-27: qty 1

## 2013-08-27 MED ORDER — SUCCINYLCHOLINE CHLORIDE 20 MG/ML IJ SOLN
INTRAMUSCULAR | Status: DC | PRN
  Administered 2013-08-27: 100 mg via INTRAVENOUS

## 2013-08-27 MED ORDER — SUFENTANIL CITRATE 50 MCG/ML IV SOLN
INTRAVENOUS | Status: AC
  Filled 2013-08-27: qty 1

## 2013-08-27 MED ORDER — DEXAMETHASONE SODIUM PHOSPHATE 10 MG/ML IJ SOLN
INTRAMUSCULAR | Status: AC
  Filled 2013-08-27: qty 1

## 2013-08-27 MED ORDER — SUFENTANIL CITRATE 50 MCG/ML IV SOLN
INTRAVENOUS | Status: DC | PRN
  Administered 2013-08-27: 5 ug via INTRAVENOUS
  Administered 2013-08-27: 15 ug via INTRAVENOUS
  Administered 2013-08-27: 5 ug via INTRAVENOUS
  Administered 2013-08-27: 10 ug via INTRAVENOUS
  Administered 2013-08-27 (×2): 5 ug via INTRAVENOUS
  Administered 2013-08-27 (×2): 10 ug via INTRAVENOUS
  Administered 2013-08-27: 5 ug via INTRAVENOUS
  Administered 2013-08-27: 10 ug via INTRAVENOUS

## 2013-08-27 MED ORDER — ONDANSETRON HCL 4 MG/2ML IJ SOLN
INTRAMUSCULAR | Status: AC
  Filled 2013-08-27: qty 2

## 2013-08-27 MED ORDER — ONDANSETRON HCL 4 MG/2ML IJ SOLN
4.0000 mg | INTRAMUSCULAR | Status: DC | PRN
  Administered 2013-08-27 – 2013-08-28 (×2): 4 mg via INTRAVENOUS
  Filled 2013-08-27: qty 2

## 2013-08-27 MED ORDER — MIDAZOLAM HCL 2 MG/2ML IJ SOLN
INTRAMUSCULAR | Status: AC
  Filled 2013-08-27: qty 2

## 2013-08-27 MED ORDER — LACTATED RINGERS IV SOLN
INTRAVENOUS | Status: DC | PRN
  Administered 2013-08-27: 08:00:00

## 2013-08-27 MED ORDER — GLYCOPYRROLATE 0.2 MG/ML IJ SOLN
INTRAMUSCULAR | Status: DC | PRN
  Administered 2013-08-27: 0.6 mg via INTRAVENOUS
  Administered 2013-08-27: 0.2 mg via INTRAVENOUS

## 2013-08-27 MED ORDER — ROCURONIUM BROMIDE 100 MG/10ML IV SOLN
INTRAVENOUS | Status: DC | PRN
  Administered 2013-08-27: 10 mg via INTRAVENOUS
  Administered 2013-08-27: 60 mg via INTRAVENOUS
  Administered 2013-08-27: 10 mg via INTRAVENOUS

## 2013-08-27 MED ORDER — HYDROMORPHONE HCL PF 2 MG/ML IJ SOLN
INTRAMUSCULAR | Status: AC
  Filled 2013-08-27: qty 1

## 2013-08-27 MED ORDER — DOCUSATE SODIUM 100 MG PO CAPS
100.0000 mg | ORAL_CAPSULE | Freq: Two times a day (BID) | ORAL | Status: DC
  Administered 2013-08-27 – 2013-08-28 (×3): 100 mg via ORAL
  Filled 2013-08-27 (×4): qty 1

## 2013-08-27 MED ORDER — DIPHENHYDRAMINE HCL 12.5 MG/5ML PO ELIX: 12.5000 mg | ORAL_SOLUTION | Freq: Four times a day (QID) | ORAL | Status: DC | PRN

## 2013-08-27 MED ORDER — CEFAZOLIN SODIUM-DEXTROSE 2-3 GM-% IV SOLR
2.0000 g | INTRAVENOUS | Status: AC
  Administered 2013-08-27: 2 g via INTRAVENOUS

## 2013-08-27 MED ORDER — ACETAMINOPHEN 325 MG PO TABS
650.0000 mg | ORAL_TABLET | ORAL | Status: DC | PRN
  Administered 2013-08-27: 650 mg via ORAL

## 2013-08-27 MED ORDER — LIDOCAINE HCL (CARDIAC) 20 MG/ML IV SOLN
INTRAVENOUS | Status: AC
  Filled 2013-08-27: qty 5

## 2013-08-27 MED ORDER — OXYCODONE-ACETAMINOPHEN 5-325 MG PO TABS: 1.0000 | ORAL_TABLET | Freq: Four times a day (QID) | ORAL | Status: DC | PRN

## 2013-08-27 MED ORDER — SODIUM CHLORIDE 0.9 % IJ SOLN
INTRAMUSCULAR | Status: AC
  Filled 2013-08-27: qty 10

## 2013-08-27 MED ORDER — NEOSTIGMINE METHYLSULFATE 1 MG/ML IJ SOLN
INTRAMUSCULAR | Status: AC
  Filled 2013-08-27: qty 10

## 2013-08-27 MED ORDER — PROPOFOL 10 MG/ML IV BOLUS
INTRAVENOUS | Status: DC | PRN
  Administered 2013-08-27: 200 mg via INTRAVENOUS
  Administered 2013-08-27: 40 mg via INTRAVENOUS
  Administered 2013-08-27 (×2): 30 mg via INTRAVENOUS

## 2013-08-27 MED ORDER — MIDAZOLAM HCL 5 MG/5ML IJ SOLN
INTRAMUSCULAR | Status: DC | PRN
  Administered 2013-08-27: 1 mg via INTRAVENOUS
  Administered 2013-08-27: 2 mg via INTRAVENOUS

## 2013-08-27 MED ORDER — MORPHINE SULFATE 2 MG/ML IJ SOLN
2.0000 mg | INTRAMUSCULAR | Status: DC | PRN
  Administered 2013-08-27 – 2013-08-28 (×6): 2 mg via INTRAVENOUS
  Filled 2013-08-27 (×6): qty 1

## 2013-08-27 MED ORDER — PROPOFOL 10 MG/ML IV BOLUS
INTRAVENOUS | Status: AC
  Filled 2013-08-27: qty 20

## 2013-08-27 MED ORDER — BUPIVACAINE-EPINEPHRINE PF 0.25-1:200000 % IJ SOLN
INTRAMUSCULAR | Status: AC
  Filled 2013-08-27: qty 30

## 2013-08-27 MED ORDER — GLYCOPYRROLATE 0.2 MG/ML IJ SOLN
INTRAMUSCULAR | Status: AC
Start: 2013-08-27 — End: 2013-08-27
  Filled 2013-08-27: qty 2

## 2013-08-27 MED ORDER — CEFAZOLIN SODIUM 1-5 GM-% IV SOLN
1.0000 g | Freq: Three times a day (TID) | INTRAVENOUS | Status: AC
  Administered 2013-08-27 – 2013-08-28 (×2): 1 g via INTRAVENOUS
  Filled 2013-08-27 (×3): qty 50

## 2013-08-27 MED ORDER — LABETALOL HCL 5 MG/ML IV SOLN
INTRAVENOUS | Status: DC | PRN
  Administered 2013-08-27: 5 mg via INTRAVENOUS

## 2013-08-27 MED ORDER — BUPIVACAINE-EPINEPHRINE 0.25% -1:200000 IJ SOLN
INTRAMUSCULAR | Status: DC | PRN
  Administered 2013-08-27: 30 mL

## 2013-08-27 MED ORDER — ROCURONIUM BROMIDE 100 MG/10ML IV SOLN
INTRAVENOUS | Status: AC
  Filled 2013-08-27: qty 1

## 2013-08-27 MED ORDER — DIPHENHYDRAMINE HCL 50 MG/ML IJ SOLN: 12.5000 mg | Freq: Four times a day (QID) | INTRAMUSCULAR | Status: DC | PRN

## 2013-08-27 MED ORDER — HYDROMORPHONE HCL PF 1 MG/ML IJ SOLN
0.2500 mg | INTRAMUSCULAR | Status: DC | PRN
  Administered 2013-08-27 (×4): 0.5 mg via INTRAVENOUS

## 2013-08-27 MED ORDER — STERILE WATER FOR IRRIGATION IR SOLN
Status: DC | PRN
  Administered 2013-08-27: 3000 mL

## 2013-08-27 MED ORDER — PROMETHAZINE HCL 25 MG/ML IJ SOLN: 6.2500 mg | INTRAMUSCULAR | Status: DC | PRN

## 2013-08-27 MED ORDER — LACTATED RINGERS IV SOLN
INTRAVENOUS | Status: DC | PRN
  Administered 2013-08-27 (×3): via INTRAVENOUS

## 2013-08-27 MED ORDER — ALLOPURINOL 100 MG PO TABS
200.0000 mg | ORAL_TABLET | Freq: Every morning | ORAL | Status: DC
  Administered 2013-08-28: 200 mg via ORAL
  Filled 2013-08-27: qty 2

## 2013-08-27 MED ORDER — SODIUM CHLORIDE 0.9 % IR SOLN
Status: DC | PRN
  Administered 2013-08-27: 1000 mL

## 2013-08-27 MED ORDER — SUCCINYLCHOLINE CHLORIDE 20 MG/ML IJ SOLN
INTRAMUSCULAR | Status: AC
  Filled 2013-08-27: qty 1

## 2013-08-27 MED ORDER — ALBUTEROL SULFATE HFA 108 (90 BASE) MCG/ACT IN AERS
INHALATION_SPRAY | RESPIRATORY_TRACT | Status: AC
  Filled 2013-08-27: qty 6.7

## 2013-08-27 MED ORDER — PHENYLEPHRINE 40 MCG/ML (10ML) SYRINGE FOR IV PUSH (FOR BLOOD PRESSURE SUPPORT)
PREFILLED_SYRINGE | INTRAVENOUS | Status: AC
  Filled 2013-08-27: qty 10

## 2013-08-27 MED ORDER — KETOROLAC TROMETHAMINE 15 MG/ML IJ SOLN
INTRAMUSCULAR | Status: AC
  Filled 2013-08-27: qty 1

## 2013-08-27 MED ORDER — LIDOCAINE HCL (CARDIAC) 20 MG/ML IV SOLN
INTRAVENOUS | Status: DC | PRN
  Administered 2013-08-27: 50 mg via INTRAVENOUS

## 2013-08-27 MED ORDER — KETOROLAC TROMETHAMINE 15 MG/ML IJ SOLN
15.0000 mg | Freq: Four times a day (QID) | INTRAMUSCULAR | Status: DC
Start: 2013-08-27 — End: 2013-08-28
  Administered 2013-08-27 – 2013-08-28 (×4): 15 mg via INTRAVENOUS
  Filled 2013-08-27 (×5): qty 1

## 2013-08-27 MED ORDER — NEOSTIGMINE METHYLSULFATE 1 MG/ML IJ SOLN
INTRAMUSCULAR | Status: DC | PRN
  Administered 2013-08-27: 4 mg via INTRAVENOUS

## 2013-08-27 MED ORDER — DEXAMETHASONE SODIUM PHOSPHATE 10 MG/ML IJ SOLN
INTRAMUSCULAR | Status: DC | PRN
  Administered 2013-08-27: 10 mg via INTRAVENOUS

## 2013-08-27 MED ORDER — LISINOPRIL 20 MG PO TABS
20.0000 mg | ORAL_TABLET | Freq: Every morning | ORAL | Status: DC
  Administered 2013-08-28: 20 mg via ORAL
  Filled 2013-08-27: qty 1

## 2013-08-27 MED ORDER — CEFAZOLIN SODIUM-DEXTROSE 2-3 GM-% IV SOLR
INTRAVENOUS | Status: AC
  Filled 2013-08-27: qty 50

## 2013-08-27 MED ORDER — CIPROFLOXACIN HCL 500 MG PO TABS: 500.0000 mg | ORAL_TABLET | Freq: Two times a day (BID) | ORAL | Status: DC

## 2013-08-27 MED ORDER — KCL IN DEXTROSE-NACL 20-5-0.45 MEQ/L-%-% IV SOLN
INTRAVENOUS | Status: AC
  Filled 2013-08-27: qty 1000

## 2013-08-27 MED ORDER — ONDANSETRON HCL 4 MG/2ML IJ SOLN
INTRAMUSCULAR | Status: DC | PRN
  Administered 2013-08-27: 4 mg via INTRAVENOUS

## 2013-08-27 MED ORDER — HEPARIN SODIUM (PORCINE) 1000 UNIT/ML IJ SOLN
INTRAMUSCULAR | Status: AC
  Filled 2013-08-27: qty 1

## 2013-08-27 MED ORDER — ACETAMINOPHEN 325 MG PO TABS
ORAL_TABLET | ORAL | Status: AC
  Filled 2013-08-27: qty 2

## 2013-08-27 MED ORDER — ATORVASTATIN CALCIUM 20 MG PO TABS
20.0000 mg | ORAL_TABLET | Freq: Every day | ORAL | Status: DC
  Filled 2013-08-27 (×2): qty 1

## 2013-08-27 MED ORDER — PHENYLEPHRINE HCL 10 MG/ML IJ SOLN
INTRAMUSCULAR | Status: DC | PRN
  Administered 2013-08-27 (×2): 80 ug via INTRAVENOUS
  Administered 2013-08-27: 40 ug via INTRAVENOUS
  Administered 2013-08-27: 80 ug via INTRAVENOUS

## 2013-08-27 MED ORDER — SODIUM CHLORIDE 0.9 % IV BOLUS (SEPSIS)
1000.0000 mL | Freq: Once | INTRAVENOUS | Status: AC
  Administered 2013-08-27: 1000 mL via INTRAVENOUS

## 2013-08-27 SURGICAL SUPPLY — 46 items
CABLE HIGH FREQUENCY MONO STRZ (ELECTRODE) ×4 IMPLANT
CANISTER SUCTION 2500CC (MISCELLANEOUS) ×4 IMPLANT
CATH FOLEY 2WAY SLVR 18FR 30CC (CATHETERS) ×4 IMPLANT
CATH ROBINSON RED A/P 16FR (CATHETERS) ×4 IMPLANT
CATH ROBINSON RED A/P 8FR (CATHETERS) ×4 IMPLANT
CATH TIEMANN FOLEY 18FR 5CC (CATHETERS) ×4 IMPLANT
CHLORAPREP W/TINT 26ML (MISCELLANEOUS) ×4 IMPLANT
CLIP LIGATING HEM O LOK PURPLE (MISCELLANEOUS) ×8 IMPLANT
CLOTH BEACON ORANGE TIMEOUT ST (SAFETY) ×4 IMPLANT
COVER SURGICAL LIGHT HANDLE (MISCELLANEOUS) ×4 IMPLANT
COVER TIP SHEARS 8 DVNC (MISCELLANEOUS) ×2 IMPLANT
COVER TIP SHEARS 8MM DA VINCI (MISCELLANEOUS) ×2
CUTTER ECHEON FLEX ENDO 45 340 (ENDOMECHANICALS) ×4 IMPLANT
DECANTER SPIKE VIAL GLASS SM (MISCELLANEOUS) IMPLANT
DERMABOND ADVANCED (GAUZE/BANDAGES/DRESSINGS) ×2
DERMABOND ADVANCED .7 DNX12 (GAUZE/BANDAGES/DRESSINGS) ×2 IMPLANT
DRAPE SURG IRRIG POUCH 19X23 (DRAPES) ×4 IMPLANT
DRSG TEGADERM 4X4.75 (GAUZE/BANDAGES/DRESSINGS) ×4 IMPLANT
DRSG TEGADERM 6X8 (GAUZE/BANDAGES/DRESSINGS) ×8 IMPLANT
ELECT REM PT RETURN 9FT ADLT (ELECTROSURGICAL) ×4
ELECTRODE REM PT RTRN 9FT ADLT (ELECTROSURGICAL) ×2 IMPLANT
GLOVE BIO SURGEON STRL SZ 6.5 (GLOVE) ×3 IMPLANT
GLOVE BIO SURGEONS STRL SZ 6.5 (GLOVE) ×1
GLOVE BIOGEL M STRL SZ7.5 (GLOVE) ×8 IMPLANT
GOWN STRL REUS W/TWL LRG LVL3 (GOWN DISPOSABLE) ×12 IMPLANT
GOWN STRL REUS W/TWL XL LVL3 (GOWN DISPOSABLE) ×8 IMPLANT
HOLDER FOLEY CATH W/STRAP (MISCELLANEOUS) ×4 IMPLANT
IV LACTATED RINGERS 1000ML (IV SOLUTION) IMPLANT
KIT ACCESSORY DA VINCI DISP (KITS) ×2
KIT ACCESSORY DVNC DISP (KITS) ×2 IMPLANT
MANIFOLD NEPTUNE II (INSTRUMENTS) ×4 IMPLANT
NDL SAFETY ECLIPSE 18X1.5 (NEEDLE) ×2 IMPLANT
NEEDLE HYPO 18GX1.5 SHARP (NEEDLE) ×2
PACK ROBOT UROLOGY CUSTOM (CUSTOM PROCEDURE TRAY) ×4 IMPLANT
RELOAD GREEN ECHELON 45 (STAPLE) ×4 IMPLANT
SET TUBE IRRIG SUCTION NO TIP (IRRIGATION / IRRIGATOR) ×4 IMPLANT
SOLUTION ELECTROLUBE (MISCELLANEOUS) ×4 IMPLANT
SUT ETHILON 3 0 PS 1 (SUTURE) ×4 IMPLANT
SUT MNCRL 3 0 RB1 (SUTURE) ×2 IMPLANT
SUT MNCRL AB 4-0 PS2 18 (SUTURE) ×8 IMPLANT
SUT MONOCRYL 3 0 RB1 (SUTURE) ×2
SUT VICRYL 0 UR6 27IN ABS (SUTURE) ×12 IMPLANT
SYR 27GX1/2 1ML LL SAFETY (SYRINGE) ×4 IMPLANT
TOWEL OR 17X26 10 PK STRL BLUE (TOWEL DISPOSABLE) ×4 IMPLANT
TOWEL OR NON WOVEN STRL DISP B (DISPOSABLE) ×8 IMPLANT
WATER STERILE IRR 1500ML POUR (IV SOLUTION) IMPLANT

## 2013-08-27 NOTE — Transfer of Care (Signed)
Immediate Anesthesia Transfer of Care Note  Patient: Taylor Elliott  Procedure(s) Performed: Procedure(s): ROBOTIC ASSISTED LAPAROSCOPIC RADICAL PROSTATECTOMY LEVEL 2 (N/A) LYMPHADENECTOMY (Bilateral)  Patient Location: PACU  Anesthesia Type:General  Level of Consciousness: awake, alert , oriented and patient cooperative  Airway & Oxygen Therapy: Patient Spontanous Breathing and Patient connected to face mask oxygen  Post-op Assessment: Report given to PACU RN, Post -op Vital signs reviewed and stable and Patient moving all extremities X 4  Post vital signs: stable  Complications: No apparent anesthesia complications

## 2013-08-27 NOTE — Anesthesia Postprocedure Evaluation (Signed)
Anesthesia Post Note  Patient: Taylor Elliott  Procedure(s) Performed: Procedure(s) (LRB): ROBOTIC ASSISTED LAPAROSCOPIC RADICAL PROSTATECTOMY LEVEL 2 (N/A) LYMPHADENECTOMY (Bilateral)  Anesthesia type: General  Patient location: PACU  Post pain: Pain level controlled  Post assessment: Post-op Vital signs reviewed  Last Vitals: BP 137/75  Pulse 80  Temp(Src) 36.8 C (Oral)  Resp 12  SpO2 99%  Post vital signs: Reviewed  Level of consciousness: sedated  Complications: No apparent anesthesia complications

## 2013-08-27 NOTE — Op Note (Signed)
Preoperative diagnosis: Clinically localized adenocarcinoma of the prostate (clinical stage T2c N0 Mx)  Postoperative diagnosis: Clinically localized adenocarcinoma of the prostate (clinical stage T2c N0 Mx)  Procedure:  1. Robotic assisted laparoscopic radical prostatectomy (non nerve sparing) 2. Bilateral robotic assisted laparoscopic pelvic lymphadenectomy  Surgeon: Pryor Curia. M.D.  Assistant(s): Leta Baptist, PA-C  Anesthesia: General  Complications: None  EBL: 200 mL  IVF:  1500 mL crystalloid  Specimens: 1. Prostate and seminal vesicles 2. Right pelvic lymph nodes 3. Left pelvic lymph nodes  Disposition of specimens: Pathology  Drains: 1. 20 Fr coude catheter 2. # 19 Blake pelvic drain  Indication: Taylor Elliott is a 63 y.o. year old patient with clinically localized prostate cancer.  After a thorough review of the management options for treatment of prostate cancer, he elected to proceed with surgical therapy and the above procedure(s).  We have discussed the potential benefits and risks of the procedure, side effects of the proposed treatment, the likelihood of the patient achieving the goals of the procedure, and any potential problems that might occur during the procedure or recuperation. Informed consent has been obtained.  Description of procedure:  The patient was taken to the operating room and a general anesthetic was administered. He was given preoperative antibiotics, placed in the dorsal lithotomy position, and prepped and draped in the usual sterile fashion. Next a preoperative timeout was performed. A urethral catheter was placed into the bladder and a site was selected near the umbilicus for placement of the camera port. This was placed using a standard open Hassan technique which allowed entry into the peritoneal cavity under direct vision and without difficulty. A 12 mm port was placed and a pneumoperitoneum established. The camera was then  used to inspect the abdomen and there was no evidence of any intra-abdominal injuries or other abnormalities. The remaining abdominal ports were then placed. 8 mm robotic ports were placed in the right lower quadrant, left lower quadrant, and far left lateral abdominal wall. A 5 mm port was placed in the right upper quadrant and a 12 mm port was placed in the right lateral abdominal wall for laparoscopic assistance. All ports were placed under direct vision without difficulty. The surgical cart was then docked.    Utilizing the cautery scissors, the bladder was reflected posteriorly allowing entry into the space of Retzius and identification of the endopelvic fascia and prostate. The periprostatic fat was then removed from the prostate allowing full exposure of the endopelvic fascia. The endopelvic fascia was then incised from the apex back to the base of the prostate bilaterally and the underlying levator muscle fibers were swept laterally off the prostate thereby isolating the dorsal venous complex. The dorsal vein was then stapled and divided with a 45 mm Flex Echelon stapler. Attention then turned to the bladder neck which was divided anteriorly thereby allowing entry into the bladder and exposure of the urethral catheter. The catheter balloon was deflated and the catheter was brought into the operative field and used to retract the prostate anteriorly. The posterior bladder neck was then examined and was divided allowing further dissection between the bladder and prostate posteriorly until the vasa deferentia and seminal vessels were identified. The vasa deferentia were isolated, divided, and lifted anteriorly. The seminal vesicles were dissected down to their tips with care to control the seminal vascular arterial blood supply. These structures were then lifted anteriorly and the space between Denonvillier's fascia and the anterior rectum was developed with a  combination of sharp and blunt dissection. This  isolated the vascular pedicles of the prostate.  A wide non nerve sparing dissection was performed with Weck clips used to ligate the vascular pedicles of the prostate bilaterally. The vascular pedicles of the prostate were then divided.  The urethra was then sharply transected allowing the prostate specimen to be disarticulated. The pelvis was copiously irrigated and hemostasis was ensured. There was no evidence for rectal injury.  Attention then turned to the right pelvic sidewall. The fibrofatty tissue between the external iliac vein, confluence of the iliac vessels, hypogastric artery, and Cooper's ligament was dissected free from the pelvic sidewall with care to preserve the obturator nerve. Weck clips were used for lymphostasis and hemostasis. An identical procedure was performed on the contralateral side and the lymphatic packets were removed for permanent pathologic analysis.  Attention then turned to the urethral anastomosis. A 2-0 Vicryl slip knot was placed between Denonvillier's fascia, the posterior bladder neck, and the posterior urethra to reapproximate these structures. A double-armed 3-0 Monocryl suture was then used to perform a 360 running tension-free anastomosis between the bladder neck and urethra. A new urethral catheter was then placed into the bladder and irrigated. There were no blood clots within the bladder and the anastomosis appeared to be watertight. A #19 Blake drain was then brought through the left lateral 8 mm port site and positioned appropriately within the pelvis. It was secured to the skin with a nylon suture. The surgical cart was then undocked. The right lateral 12 mm port site was closed at the fascial level with a 0 Vicryl suture placed laparoscopically. All remaining ports were then removed under direct vision. The prostate specimen was removed intact within the Endopouch retrieval bag via the periumbilical camera port site. This fascial opening was closed with  two running 0 Vicryl sutures. 0.25% Marcaine was then injected into all port sites and all incisions were reapproximated at the skin level with 4-0 monocryl subcuticular sutures. Sterile dressings were applied. The patient appeared to tolerate the procedure well and without complications. The patient was able to be extubated and transferred to the recovery unit in satisfactory condition.  Pryor Curia MD

## 2013-08-27 NOTE — Progress Notes (Signed)
Patient ID: Taylor Elliott, male   DOB: 29-Dec-1950, 63 y.o.   MRN: [id number]  I evaluated [name] this evening.  He does have weakness in adduction of his right lower extremity.   This is consistent with a right obturator neuropathy.  I discussed this finding with [name].  He will ambulate with a walker this evening if he feels unsteady on his feet.  He will work on strengthening adduction of his right leg.  He did not suffer any obvious obturator nerve injury during his surgery and I told him I have expectations that he will recover dysfunction although he understands that it may take some time.  He will be further evaluated in the morning.  He will begin ambulating.  Otherwise, he appears to be doing quite well and recovering appropriately.

## 2013-08-27 NOTE — Progress Notes (Signed)
Moving self within limits of stretcher for comfort while waiting for bed assignment. Hx chronic back pain. PO tylenol given as ordered pain scale 6. Tolerating po without n/v.

## 2013-08-27 NOTE — Discharge Instructions (Signed)
1. Activity:  You are encouraged to ambulate frequently (about every hour during waking hours) to help prevent blood clots from forming in your legs or lungs.  However, you should not engage in any heavy lifting (> 10-15 lbs), strenuous activity, or straining. 2. Diet: You should continue a clear liquid diet until passing gas from below.  Once this occurs, you may advance your diet to a soft diet that would be easy to digest (i.e soups, scrambled eggs, mashed potatoes, etc.) for 24 hours just as you would if getting over a bad stomach flu.  If tolerating this diet well for 24 hours, you may then begin eating regular food.  It will be normal to have some amount of bloating, nausea, and abdominal discomfort intermittently. 3. Prescriptions:  You will be provided a prescription for pain medication to take as needed.  If your pain is not severe enough to require the prescription pain medication, you may take extra strength Tylenol instead.  You should also take an over the counter stool softener (Colace 100 mg twice daily) to avoid straining with bowel movements as the pain medication may constipate you. Finally, you will also be provided a prescription for an antibiotic to begin the day prior to your return visit in the office for catheter removal. 4. Catheter care: You will be taught how to take care of the catheter by the nursing staff prior to discharge from the hospital.  You may use both a leg bag and the larger bedside bag but it is recommended to at least use the bigger bedside bag at nighttime as the leg bag is small and will fill up overnight and also does not drain as well when lying flat. You may periodically feel a strong urge to void with the catheter in place.  This is a bladder spasm and most often can occur when having a bowel movement or when you are moving around. It is typically self-limited and usually will stop after a few minutes.  You may use some Vaseline or Neosporin around the tip of the  catheter to reduce friction at the tip of the penis. 5. Incisions: You may remove your dressing bandages the 2nd day after surgery.  You most likely will have a few small staples in each of the incisions and once the bandages are removed, the incisions may stay open to air.  You may start showering (not soaking or bathing in water) 48 hours after surgery and the incisions simply need to be patted dry after the shower.  No additional care is needed. 6. What to call us about: You should call the office [phone]) if you develop fever > 101, persistent vomiting, or the catheter stops draining. Also, feel free to call with any other questions you may have and remember the handout that was provided to you as a reference preoperatively which answers many of the common questions that arise after surgery.  You may resume aspirin, vitamins, and supplements 7 days after surgery.

## 2013-08-27 NOTE — Anesthesia Preprocedure Evaluation (Signed)
Anesthesia Evaluation  Patient identified by MRN, date of birth, ID band Patient awake    Reviewed: Allergy & Precautions, H&P , NPO status , Patient's Chart, lab work & pertinent test results  Airway Mallampati: II TM Distance: >3 FB Neck ROM: Full    Dental no notable dental hx.    Pulmonary Current Smoker,  breath sounds clear to auscultation  Pulmonary exam normal       Cardiovascular Exercise Tolerance: Good hypertension, Pt. on medications negative cardio ROS  Rhythm:Regular Rate:Normal     Neuro/Psych negative neurological ROS  negative psych ROS   GI/Hepatic Neg liver ROS, GERD-  Medicated,  Endo/Other  negative endocrine ROS  Renal/GU negative Renal ROS  negative genitourinary   Musculoskeletal negative musculoskeletal ROS (+)   Abdominal   Peds negative pediatric ROS (+)  Hematology negative hematology ROS (+)   Anesthesia Other Findings   Reproductive/Obstetrics negative OB ROS                           Anesthesia Physical Anesthesia Plan  ASA: II  Anesthesia Plan: General   Post-op Pain Management:    Induction: Intravenous  Airway Management Planned: Oral ETT  Additional Equipment:   Intra-op Plan:   Post-operative Plan: Extubation in OR  Informed Consent: I have reviewed the patients History and Physical, chart, labs and discussed the procedure including the risks, benefits and alternatives for the proposed anesthesia with the patient or authorized representative who has indicated his/her understanding and acceptance.   Dental advisory given  Plan Discussed with: CRNA  Anesthesia Plan Comments:         Anesthesia Quick Evaluation

## 2013-08-27 NOTE — Progress Notes (Signed)
Patient ID: Taylor Elliott, male   DOB: Nov 19, 1950, 63 y.o.   MRN: [id number] Post-op note  Subjective: The patient is doing well.  No complaints.  Objective: Vital signs in last 24 hours: Temp:  [97.4 F (36.3 C)-98.2 F (36.8 C)] 97.4 F (36.3 C) (01/29 1423) Pulse Rate:  [80-107] 90 (01/29 1423) Resp:  [10-20] 20 (01/29 1423) BP: (114-161)/(53-92) 135/81 mmHg (01/29 1423) SpO2:  [94 %-100 %] 94 % (01/29 1423)  Intake/Output from previous day:   Intake/Output this shift: Total I/O In: 4000 [I.V.:3000; IV Piggyback:1000] Out: 700 [Urine:100; Drains:50; Blood:550]  Physical Exam:  General: Alert and oriented. Abdomen: Soft, Nondistended. Incisions: Clean and dry. Urine: pink  Lab Results:  Recent Labs  08/27/13 1050  HGB 13.8  HCT 41.1    Assessment/Plan: POD#0   1) Continue to monitor  2) DVT prophy, clears, IS, amb, pain control    LOS: 0 days   Marcie Bal. 08/27/2013, 2:44 PM

## 2013-08-28 ENCOUNTER — Encounter (HOSPITAL_COMMUNITY): Payer: Self-pay | Admitting: Urology

## 2013-08-28 LAB — HEMOGLOBIN AND HEMATOCRIT, BLOOD
HCT: 33.9 % — ABNORMAL LOW (ref 39.0–52.0)
Hemoglobin: 11.6 g/dL — ABNORMAL LOW (ref 13.0–17.0)

## 2013-08-28 MED ORDER — HYDROCODONE-ACETAMINOPHEN 5-325 MG PO TABS
1.0000 | ORAL_TABLET | Freq: Four times a day (QID) | ORAL | Status: DC | PRN
  Administered 2013-08-28: 2 via ORAL
  Filled 2013-08-28: qty 2

## 2013-08-28 MED ORDER — BISACODYL 10 MG RE SUPP
10.0000 mg | Freq: Once | RECTAL | Status: AC
  Administered 2013-08-28: 10 mg via RECTAL
  Filled 2013-08-28: qty 1

## 2013-08-28 NOTE — Progress Notes (Signed)
Patient ID: Taylor Elliott, male   DOB: 1951-02-24, 63 y.o.   MRN: [id number]  1 Day Post-Op Subjective: The patient is doing well.  No nausea or vomiting. Pain is adequately controlled. His right lower extremity obturator neuropathy is definitely improving but he still is lacking full ability to adduct right leg. Has been ambulating with walker over night.  Objective: Vital signs in last 24 hours: Temp:  [97.4 F (36.3 C)-98.3 F (36.8 C)] 97.5 F (36.4 C) (01/30 0612) Pulse Rate:  [80-117] 84 (01/30 0612) Resp:  [10-20] 18 (01/30 0612) BP: (113-148)/(50-92) 131/71 mmHg (01/30 0612) SpO2:  [94 %-100 %] 100 % (01/30 0612) Weight:  [88.451 kg (195 lb)] 88.451 kg (195 lb) (01/29 1445)  Intake/Output from previous day: 01/29 0701 - 01/30 0700 In: 6757.5 [P.O.:320; I.V.:5387.5; IV SJGGEZMOQ:9476] Out: 2485 [LYYTK:3546; Drains:305; Blood:550] Intake/Output this shift:    Physical Exam:  General: Alert and oriented. CV: RRR Lungs: Clear bilaterally. GI: Soft, Nondistended. Incisions: Dressings intact. Urine: Clear Extremities: Nontender, no erythema, no edema.  Lab Results:  Recent Labs  08/27/13 1050 08/28/13 0430  HGB 13.8 11.6*  HCT 41.1 33.9*      Assessment/Plan: POD# 1 s/p robotic prostatectomy.  1) SL IVF 2) Ambulate, Incentive spirometry 3) Transition to oral pain medication 4) Dulcolax suppository 5) D/C pelvic drain 6) Will have physical therapy evaluate and make recommendations for ambulation.  Considering that he has baseline weakness in his left leg, he is having difficulty ambulating without assistance right now.  I expect his obturator neuropathy to continue to improve considering rapid improvement already. 7) Discharge today will be pending PT evaluation and recommendations   Pryor Curia. MD   LOS: 1 day   Areatha Kalata,LES 08/28/2013, 7:23 AM

## 2013-08-28 NOTE — Discharge Summary (Signed)
  Date of admission: 08/27/2013  Date of discharge: 08/28/2013  Admission diagnosis: Prostate Cancer  Discharge diagnosis: Prostate Cancer  History and Physical: For full details, please see admission history and physical. Briefly, Taylor Elliott is a 63 y.o. gentleman with localized prostate cancer.  After discussing management/treatment options, he elected to proceed with surgical treatment.  Hospital Course: Taylor Elliott was taken to the operating room on 08/27/2013 and underwent a robotic assisted laparoscopic radical prostatectomy. He tolerated this procedure well and without complications. Postoperatively, he was able to be transferred to a regular hospital room following recovery from anesthesia.  He was able to begin ambulating the night of surgery although was noted to have weak adduction of his right lower extremity.  This improved but remained week in the morning after surgery.  He underwent a physical therapy consultation and was recommended to perform rehab exercises and it was felt that he would continue to improve considering his rapid improvement even in the hospital.  He remained hemodynamically stable overnight.  He had excellent urine output with appropriately minimal output from his pelvic drain and his pelvic drain was removed on POD #1.  He was transitioned to oral pain medication, tolerated a clear liquid diet, and had met all discharge criteria and was able to be discharged home later on POD#1.  Laboratory values:  Recent Labs  08/27/13 1050 08/28/13 0430  HGB 13.8 11.6*  HCT 41.1 33.9*    Disposition: Home  Discharge instruction: He was instructed to be ambulatory but to refrain from heavy lifting, strenuous activity, or driving. He was instructed on urethral catheter care.  Discharge medications:     Medication List    STOP taking these medications       calcium carbonate 500 MG chewable tablet  Commonly known as:  TUMS - dosed in mg elemental calcium     l-methylfolate-B6-B12 3-35-2 MG Tabs  Commonly known as:  METANX     multivitamin with minerals Tabs tablet      TAKE these medications       allopurinol 100 MG tablet  Commonly known as:  ZYLOPRIM  Take 200 mg by mouth every morning.     ciprofloxacin 500 MG tablet  Commonly known as:  CIPRO  Take 1 tablet (500 mg total) by mouth 2 (two) times daily. Start day prior to office visit for foley removal     lisinopril 20 MG tablet  Commonly known as:  PRINIVIL,ZESTRIL  Take 20 mg by mouth every morning.     omega-3 acid ethyl esters 1 G capsule  Commonly known as:  LOVAZA  Take 2 g by mouth daily.     oxyCODONE-acetaminophen 5-325 MG per tablet  Commonly known as:  ROXICET  Take 1-2 tablets by mouth every 6 (six) hours as needed for moderate pain or severe pain.     rosuvastatin 10 MG tablet  Commonly known as:  CRESTOR  Take 10 mg by mouth every morning.        Followup: He will followup in 1 week for catheter removal and to discuss his surgical pathology results.

## 2013-08-28 NOTE — Evaluation (Signed)
Physical Therapy One Time Evaluation Patient Details Name: CAELAN BRANDEN MRN: [id number] DOB: 1951-03-30 Today's Date: 08/28/2013 Time: 2025-4270 PT Time Calculation (min): 15 min  PT Assessment / Plan / Recommendation History of Present Illness  Pt is a 63 year old male s/p Robotic assisted laparoscopic radical prostatectomy (non nerve sparing). Per urologist note: "He does have weakness in adduction of his right lower extremity.   This is consistent with a right obturator neuropathy.  I discussed this finding with Mr. Hoe.  He will ambulate with a walker this evening if he feels unsteady on his feet.  He will work on strengthening adduction of his right leg.  He did not suffer any obvious obturator nerve injury during his surgery and I told him I have expectations that he will recover dysfunction although he understands that it may take some time."  Clinical Impression  Patient evaluated by Physical Therapy with no further acute PT needs identified. All education has been completed and the patient has no further questions.  Pt very familiar with orthopaedics, states he sees Dr. Sharol Given and explained his orthopaedic hx.  Pt reports difficulty with hip adduction and does present with decreased strength so pt educated on 3 exercises with progression for increasing hip adduction strength (pillow squeezes, active supine adduction, and sidelying adduction).  Pt also educated in safe use of RW and encouraged to wear his L AFO until R LE functioning better (states he has L foot brace however does not wear anymore) for increasing his safety and decreasing risk of falls.  Pt appreciative of PT consult and feels he can manage without further PT at this time. PT is signing off. Thank you for this referral.     PT Assessment  Patent does not need any further PT services    Follow Up Recommendations  No PT follow up    Does the patient have the potential to tolerate intense rehabilitation      Barriers to  Discharge        Equipment Recommendations  None recommended by PT (pt states he is able to obtain RW)    Recommendations for Other Services     Frequency      Precautions / Restrictions Precautions Precautions: Fall   Pertinent Vitals/Pain Reports chronic back pain, states ambulation and changing positions eases his pain      Mobility  Bed Mobility Overal bed mobility: Modified Independent Transfers Overall transfer level: Modified independent Ambulation/Gait Ambulation/Gait assistance: Supervision Ambulation Distance (Feet): 200 Feet Assistive device: Rolling walker (2 wheeled) Gait Pattern/deviations: Trunk flexed General Gait Details: verbal cues for use of RW including distance, pt had been using SW in room however likes RW much better and states able to obtain RW upon d/c, occasional usnteadiness with turns however pt able to self correct    Exercises     PT Diagnosis:    PT Problem List:   PT Treatment Interventions:       PT Goals(Current goals can be found in the care plan section) Acute Rehab PT Goals PT Goal Formulation: No goals set, d/c therapy  Visit Information  Last PT Received On: 08/28/13 Assistance Needed: +1 History of Present Illness: Pt is a 63 year old male s/p Robotic assisted laparoscopic radical prostatectomy (non nerve sparing). Per urologist note: "He does have weakness in adduction of his right lower extremity.   This is consistent with a right obturator neuropathy.  I discussed this finding with Mr. Bianchini.  He will ambulate with  a walker this evening if he feels unsteady on his feet.  He will work on strengthening adduction of his right leg.  He did not suffer any obvious obturator nerve injury during his surgery and I told him I have expectations that he will recover dysfunction although he understands that it may take some time."       Prior [place] expects to be discharged to:: Private residence Living  Arrangements: Spouse/significant other Home Equipment: None Prior Function Level of Independence: Independent Communication Communication: No difficulties    Cognition  Cognition Arousal/Alertness: Awake/alert Behavior During Therapy: WFL for tasks assessed/performed Overall Cognitive Status: Within Functional Limits for tasks assessed    Extremity/Trunk Assessment Lower Extremity Assessment Lower Extremity Assessment: RLE deficits/detail;LLE deficits/detail RLE Deficits / Details: 3/5 hip adductor strength, states able to move against gravity today however yesterday unable to perform movement in bed LLE Deficits / Details: hx of L LE weakness and no sensation however able to feel 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

## 2013-09-06 DIAGNOSIS — C61 Malignant neoplasm of prostate: Secondary | ICD-10-CM | POA: Insufficient documentation

## 2014-10-28 ENCOUNTER — Other Ambulatory Visit (HOSPITAL_COMMUNITY): Payer: Self-pay | Admitting: Orthopedic Surgery

## 2014-10-28 DIAGNOSIS — M545 Low back pain: Secondary | ICD-10-CM

## 2014-11-03 ENCOUNTER — Ambulatory Visit (HOSPITAL_COMMUNITY)
Admission: RE | Admit: 2014-11-03 | Discharge: 2014-11-03 | Disposition: A | Discharge status: Home / Self Care | Payer: 59 | Source: Ambulatory Visit | Attending: Orthopedic Surgery | Admitting: Orthopedic Surgery

## 2014-11-03 DIAGNOSIS — N2889 Other specified disorders of kidney and ureter: Secondary | ICD-10-CM | POA: Insufficient documentation

## 2014-11-03 DIAGNOSIS — M1288 Other specific arthropathies, not elsewhere classified, other specified site: Secondary | ICD-10-CM | POA: Insufficient documentation

## 2014-11-03 DIAGNOSIS — M4806 Spinal stenosis, lumbar region: Secondary | ICD-10-CM | POA: Diagnosis not present

## 2014-11-03 DIAGNOSIS — M545 Low back pain: Secondary | ICD-10-CM | POA: Diagnosis present

## 2014-11-03 DIAGNOSIS — M5441 Lumbago with sciatica, right side: Secondary | ICD-10-CM | POA: Diagnosis not present

## 2014-11-09 ENCOUNTER — Institutional Professional Consult (permissible substitution): Payer: Self-pay | Admitting: Neurology

## 2014-11-11 ENCOUNTER — Other Ambulatory Visit: Payer: Self-pay | Admitting: Urology

## 2014-11-12 NOTE — Patient Instructions (Addendum)
Taylor Elliott  11/12/2014   Your procedure is scheduled on:    11/18/2014    Report to [facility] Main  Entrance and follow signs to               [place] at      [place] [facility].  Call this number if you have problems the morning of surgery [phone]   Remember:  Do not eat food or drink liquids :After Midnight.     Take these medicines the morning of surgery with A SIP OF WATER:  Allopurinol, amlodipine ( Norvasc), Zantac if needed, Vesicare                                You may not have any metal on your body including hair pins and              piercings  Do not wear jewelry, make-up, lotions, powders or perfumes.                       Men may shave face and neck.   Do not bring valuables to the hospital. [place].  Contacts, dentures or bridgework may not be worn into surgery.  Leave suitcase in the car. After surgery it may be brought to your room.       Special Instructions: coughing and deep breathing exercises, leg exercises               Please read over the following fact sheets you were given: _____________________________________________________________________             [facility] - Preparing for Surgery Before surgery, you can play an important role.  Because skin is not sterile, your skin needs to be as free of germs as possible.  You can reduce the number of germs on your skin by washing with CHG (chlorahexidine gluconate) soap before surgery.  CHG is an antiseptic cleaner which kills germs and bonds with the skin to continue killing germs even after washing. Please DO NOT use if you have an allergy to CHG or antibacterial soaps.  If your skin becomes reddened/irritated stop using the CHG and inform your nurse when you arrive at Short Stay. Do not shave (including legs and underarms) for at least 48 hours prior to the first CHG shower.  You may shave your face/neck. Please follow  these instructions carefully:  1.  Shower with CHG Soap the night before surgery and the  morning of Surgery.  2.  If you choose to wash your hair, wash your hair first as usual with your  normal  shampoo.  3.  After you shampoo, rinse your hair and body thoroughly to remove the  shampoo.                           4.  Use CHG as you would any other liquid soap.  You can apply chg directly  to the skin and wash                       Gently with a scrungie or clean washcloth.  5.  Apply the CHG Soap  to your body ONLY FROM THE NECK DOWN.   Do not use on face/ open                           Wound or open sores. Avoid contact with eyes, ears mouth and genitals (private parts).                       Wash face,  Genitals (private parts) with your normal soap.             6.  Wash thoroughly, paying special attention to the area where your surgery  will be performed.  7.  Thoroughly rinse your body with warm water from the neck down.  8.  DO NOT shower/wash with your normal soap after using and rinsing off  the CHG Soap.                9.  Pat yourself dry with a clean towel.            10.  Wear clean pajamas.            11.  Place clean sheets on your bed the night of your first shower and do not  sleep with pets. Day of Surgery : Do not apply any lotions/deodorants the morning of surgery.  Please wear clean clothes to the hospital/surgery center.  FAILURE TO FOLLOW THESE INSTRUCTIONS MAY RESULT IN THE CANCELLATION OF YOUR SURGERY PATIENT SIGNATURE_________________________________  NURSE SIGNATURE__________________________________  ________________________________________________________________________  WHAT IS A BLOOD TRANSFUSION? Blood Transfusion Information  A transfusion is the replacement of blood or some of its parts. Blood is made up of multiple cells which provide different functions.  Red blood cells carry oxygen and are used for blood loss replacement.  White blood cells fight  against infection.  Platelets control bleeding.  Plasma helps clot blood.  Other blood products are available for specialized needs, such as hemophilia or other clotting disorders. BEFORE THE TRANSFUSION  Who gives blood for transfusions?   Healthy volunteers who are fully evaluated to make sure their blood is safe. This is blood bank blood. Transfusion therapy is the safest it has ever been in the practice of medicine. Before blood is taken from a donor, a complete history is taken to make sure that person has no history of diseases nor engages in risky social behavior (examples are intravenous drug use or sexual activity with multiple partners). The donor's travel history is screened to minimize risk of transmitting infections, such as malaria. The donated blood is tested for signs of infectious diseases, such as HIV and hepatitis. The blood is then tested to be sure it is compatible with you in order to minimize the chance of a transfusion reaction. If you or a relative donates blood, this is often done in anticipation of surgery and is not appropriate for emergency situations. It takes many days to process the donated blood. RISKS AND COMPLICATIONS Although transfusion therapy is very safe and saves many lives, the main dangers of transfusion include:  1. Getting an infectious disease. 2. Developing a transfusion reaction. This is an allergic reaction to something in the blood you were given. Every precaution is taken to prevent this. The decision to have a blood transfusion has been considered carefully by your caregiver before blood is given. Blood is not given unless the benefits outweigh the risks. AFTER THE TRANSFUSION  Right after receiving a blood transfusion, you will  usually feel much better and more energetic. This is especially true if your red blood cells have gotten low (anemic). The transfusion raises the level of the red blood cells which carry oxygen, and this usually causes an  energy increase.  The nurse administering the transfusion will monitor you carefully for complications. HOME CARE INSTRUCTIONS  No special instructions are needed after a transfusion. You may find your energy is better. Speak with your caregiver about any limitations on activity for underlying diseases you may have. SEEK MEDICAL CARE IF:   Your condition is not improving after your transfusion.  You develop redness or irritation at the intravenous (IV) site. SEEK IMMEDIATE MEDICAL CARE IF:  Any of the following symptoms occur over the next 12 hours:  Shaking chills.  You have a temperature by mouth above 102 F (38.9 C), not controlled by medicine.  Chest, back, or muscle pain.  People around you feel you are not acting correctly or are confused.  Shortness of breath or difficulty breathing.  Dizziness and fainting.  You get a rash or develop hives.  You have a decrease in urine output.  Your urine turns a dark color or changes to pink, red, or brown. Any of the following symptoms occur over the next 10 days:  You have a temperature by mouth above 102 F (38.9 C), not controlled by medicine.  Shortness of breath.  Weakness after normal activity.  The white part of the eye turns yellow (jaundice).  You have a decrease in the amount of urine or are urinating less often.  Your urine turns a dark color or changes to pink, red, or brown. Document Released: 07/13/2000 Document Revised: 10/08/2011 Document Reviewed: 03/01/2008 Exit[employer] Patient Information 2014 [place].  _______________________________________________________________________  Incentive Spirometer  An incentive spirometer is a tool that can help keep your lungs clear and active. This tool measures how well you are filling your lungs with each breath. Taking long deep breaths may help reverse or decrease the chance of developing breathing (pulmonary) problems (especially infection) following:  A  long period of time when you are unable to move or be active. BEFORE THE PROCEDURE   If the spirometer includes an indicator to show your best effort, your nurse or respiratory therapist will set it to a desired goal.  If possible, sit up straight or lean slightly forward. Try not to slouch.  Hold the incentive spirometer in an upright position. INSTRUCTIONS FOR USE  3. Sit on the edge of your bed if possible, or sit up as far as you can in bed or on a chair. 4. Hold the incentive spirometer in an upright position. 5. Breathe out normally. 6. Place the mouthpiece in your mouth and seal your lips tightly around it. 7. Breathe in slowly and as deeply as possible, raising the piston or the ball toward the top of the column. 8. Hold your breath for 3-5 seconds or for as long as possible. Allow the piston or ball to fall to the bottom of the column. 9. Remove the mouthpiece from your mouth and breathe out normally. 10. Rest for a few seconds and repeat Steps 1 through 7 at least 10 times every 1-2 hours when you are awake. Take your time and take a few normal breaths between deep breaths. 11. The spirometer may include an indicator to show your best effort. Use the indicator as a goal to work toward during each repetition. 12. After each set of 10 deep breaths, practice coughing  to be sure your lungs are clear. If you have an incision (the cut made at the time of surgery), support your incision when coughing by placing a pillow or rolled up towels firmly against it. Once you are able to get out of bed, walk around indoors and cough well. You may stop using the incentive spirometer when instructed by your caregiver.  RISKS AND COMPLICATIONS  Take your time so you do not get dizzy or light-headed.  If you are in pain, you may need to take or ask for pain medication before doing incentive spirometry. It is harder to take a deep breath if you are having pain. AFTER USE  Rest and breathe slowly and  easily.  It can be helpful to keep track of a log of your progress. Your caregiver can provide you with a simple table to help with this. If you are using the spirometer at home, follow these instructions: [place] IF:   You are having difficultly using the spirometer.  You have trouble using the spirometer as often as instructed.  Your pain medication is not giving enough relief while using the spirometer.  You develop fever of 100.5 F (38.1 C) or higher. SEEK IMMEDIATE MEDICAL CARE IF:   You cough up bloody sputum that had not been present before.  You develop fever of 102 F (38.9 C) or greater.  You develop worsening pain at or near the incision site. MAKE SURE YOU:   Understand these instructions.  Will watch your condition.  Will get help right away if you are not doing well or get worse. Document Released: 11/26/2006 Document Revised: 10/08/2011 Document Reviewed: 01/27/2007 [facility] Patient Information 2014 [place], [place].   ________________________________________________________________________

## 2014-11-15 ENCOUNTER — Encounter (HOSPITAL_COMMUNITY): Payer: Self-pay

## 2014-11-15 ENCOUNTER — Encounter (HOSPITAL_COMMUNITY)
Admission: RE | Admit: 2014-11-15 | Discharge: 2014-11-15 | Disposition: A | Discharge status: Home / Self Care | Payer: 59 | Source: Ambulatory Visit | Attending: Urology | Admitting: Urology

## 2014-11-15 LAB — BASIC METABOLIC PANEL
Anion gap: 8 (ref 5–15)
BUN: 13 mg/dL (ref 6–23)
CO2: 27 mmol/L (ref 19–32)
Calcium: 9.7 mg/dL (ref 8.4–10.5)
Chloride: 103 mmol/L (ref 96–112)
Creatinine, Ser: 1.05 mg/dL (ref 0.50–1.35)
GFR calc Af Amer: 86 mL/min — ABNORMAL LOW (ref >90)
GFR calc non Af Amer: 74 mL/min — ABNORMAL LOW (ref >90)
Glucose, Bld: 113 mg/dL — ABNORMAL HIGH (ref 70–99)
Potassium: 4.7 mmol/L (ref 3.5–5.1)
Sodium: 138 mmol/L (ref 135–145)

## 2014-11-15 LAB — CBC
HCT: 45.9 % (ref 39.0–52.0)
Hemoglobin: 15.4 g/dL (ref 13.0–17.0)
MCH: 31.8 pg (ref 26.0–34.0)
MCHC: 33.6 g/dL (ref 30.0–36.0)
MCV: 94.8 fL (ref 78.0–100.0)
Platelets: 250 10*3/uL (ref 150–400)
RBC: 4.84 MIL/uL (ref 4.22–5.81)
RDW: 13.3 % (ref 11.5–15.5)
WBC: 9.2 10*3/uL (ref 4.0–10.5)

## 2014-11-15 NOTE — Progress Notes (Signed)
Called Selita Bradsher ( surgery Scheduler ) for  Dr Alinda Money and left message regarding" Patient would like to know if they can have their 4 allergy shots they receive monthly prior to surgery.  Instructed Noemi Chapel to call patient at [phone] regarding these instructions

## 2014-11-15 NOTE — Progress Notes (Signed)
Stress Test dated 10/17/11 on chart  2V CXR 2014 on chart  EKG 2010 on chart  Spirometry Report 06/12/13 on chart  LOV- 06/12/2013 with Dr Bernerd Limbo on chart

## 2014-11-16 NOTE — Progress Notes (Signed)
Final EKG done 11/15/14 in [facility].

## 2014-11-16 NOTE — Progress Notes (Signed)
Stress Test on chart done 10/17/11  2V CXR done 05/2013 on chart  EKG 2010 on chart  06/12/2013 LOV with Dr Coletta Memos on chart

## 2014-11-17 NOTE — Anesthesia Preprocedure Evaluation (Addendum)
Anesthesia Evaluation  Patient identified by MRN, date of birth, ID band Patient awake    Reviewed: Allergy & Precautions, NPO status , Patient's Chart, lab work & pertinent test results  History of Anesthesia Complications Negative for: history of anesthetic complications  Airway Mallampati: III  TM Distance: >3 FB Neck ROM: Full    Dental no notable dental hx. (+) Dental Advisory Given, Caps   Pulmonary Current Smoker,  breath sounds clear to auscultation  Pulmonary exam normal       Cardiovascular hypertension, Pt. on medications Rhythm:Regular Rate:Normal     Neuro/Psych negative neurological ROS  negative psych ROS   GI/Hepatic Neg liver ROS, GERD-  Medicated and Controlled,  Endo/Other  negative endocrine ROS  Renal/GU negative Renal ROS  negative genitourinary   Musculoskeletal negative musculoskeletal ROS (+)   Abdominal   Peds negative pediatric ROS (+)  Hematology negative hematology ROS (+)   Anesthesia Other Findings L foot drop, hx of spinal tumor as a child and resultant L sided weakness in lower ext and foot drop after surgery. Also reports R sided intermittent weakness and numbness from L2-3 compression. As well as back discomfort  Reproductive/Obstetrics negative OB ROS                            Anesthesia Physical Anesthesia Plan  ASA: II  Anesthesia Plan: General   Post-op Pain Management:    Induction: Intravenous  Airway Management Planned: Oral ETT  Additional Equipment:   Intra-op Plan:   Post-operative Plan: Extubation in OR  Informed Consent: I have reviewed the patients History and Physical, chart, labs and discussed the procedure including the risks, benefits and alternatives for the proposed anesthesia with the patient or authorized representative who has indicated his/her understanding and acceptance.   Dental advisory given  Plan Discussed  with: CRNA  Anesthesia Plan Comments:         Anesthesia Quick Evaluation

## 2014-11-17 NOTE — H&P (Signed)
History of Present Illness Taylor Elliott is a 64 year old with prostate cancer s/p a NNS RAL radical prostatectomy and BPLND on 08/27/13. His PSA has been undetectable since surgery.    Diagnosis: pT3a N0 Mx, Gleason 3+4=7 adenocarcinoma with negative surgical margins  Pretreatment PSA: 4.12  Pretreatment SHIM: 21    Interval history:    Taylor Elliott follows up today for a new complaint of a right renal mass. This was noted incidentally on a lumbar spine MRI that was performed due to persistent neurologic deficit in his right lower extremity. He has denied any hematuria, abdominal pain symptoms, and has no family history or personal history of kidney cancer. He follows up today after undergoing dedicated renal imaging with a CT scan of the abdomen with and without IV contrast.     Past Medical History Problems  1. History of Anxiety (F41.9) 2. History of esophageal reflux (Z87.19) 3. History of gout (Z87.39) 4. History of hypertension (Z86.79)  Surgical History Problems  1. History of Appendectomy 2. History of Back Surgery 3. History of Foot Surgery 4. History of Knee Arthroscopy With Medial Meniscus Repair 5. History of Laparoscopy With Bilateral Total Pelvic Lymphadenectomy 6. History of Leg Repair 7. History of Prostatect Retropubic Radical W/ Nerve Sparing Laparoscopic 8. History of Surgery Of Male Genitalia Vasectomy  Current Meds 1. Allopurinol 100 MG Oral Tablet; TAKE 1 TABLET TWICE DAILY;  Therapy: (Recorded:25Mar2015) to Recorded 2. Baby Aspirin 81 MG CHEW;  Therapy: (Recorded:24Feb2016) to Recorded 3. Colcrys 0.6 MG Oral Tablet; AS NEEDED;  Therapy: (Recorded:25Mar2015) to Recorded 4. Crestor 10 MG Oral Tablet;  Therapy: (Recorded:21Nov2014) to Recorded 5. Ibuprofen TABS; AS NEEDED;  Therapy: (Recorded:25Mar2015) to Recorded 6. Lisinopril 20 MG Oral Tablet;  Therapy: (Recorded:21Nov2014) to Recorded 7. Multi-Vitamin TABS;  Therapy: (Recorded:20May2009) to  Recorded 8. Tums CHEW;  Therapy: (Recorded:21Nov2014) to Recorded 9. VESIcare 10 MG Oral Tablet; Take 1 tablet daily;  Therapy: [id number] to (Evaluate:03Mar2017)  Requested for: [id number]; Last  Rx:08Mar2016 Ordered  Allergies Medication  1. No Known Drug Allergies Non-Medication  2. Bee sting  Family History Problems  1. Family history of Death In The Family Mother : Mother   died age 83-lung cancer 2. Family history of Nephrolithiasis : Father  Social History Problems  1. Alcohol Use   3 beers/day 2. Current every day smoker (F17.200)   smoke 1 ppd 3. Marital History - Currently Married 4. Occupation:   self employed-sales  Review of Systems  Genitourinary: no hematuria.  Constitutional: no night sweats and no recent weight loss.  Cardiovascular: no leg swelling.    Vitals Vital Signs [Data Includes: Last 1 Day]  Recorded: 13Apr2016 02:52PM  Blood Pressure: 136 / 84 Heart Rate: 94  Physical Exam Constitutional: Well nourished and well developed . No acute distress.  ENT:. The ears and nose are normal in appearance.  Neck: The appearance of the neck is normal and no neck mass is present.  Pulmonary: No respiratory distress, normal respiratory rhythm and effort and clear bilateral breath sounds.  Cardiovascular: Heart rate and rhythm are normal . No peripheral edema.  Abdomen: Incision site(s) well healed. The abdomen is soft and nontender. No masses are palpated. No CVA tenderness. No hernias are palpable. No hepatosplenomegaly noted.  Lymphatics: The supraclavicular, femoral and inguinal nodes are not enlarged or tender.  Skin: Normal skin turgor, no visible rash and no visible skin lesions.  Neuro/Psych:. Mood and affect are appropriate.    Results/Data Urine [Data Includes: Last 1 Day]  [id number]  COLOR STRAW   APPEARANCE CLEAR   SPECIFIC GRAVITY 1.010   pH 5.0   GLUCOSE NEG mg/dL  BILIRUBIN NEG   KETONE NEG mg/dL  BLOOD NEG   PROTEIN NEG mg/dL   UROBILINOGEN 0.2 mg/dL  NITRITE NEG   LEUKOCYTE ESTERASE NEG   Selected Results  CT-ABD/PELVIS W/W/O CONTRAST 12Apr2016 12:00AM Taylor Elliott   Test Name Result Flag Reference  CT-ABD/PELVIS W/W/O CONTRAST (Report)    ** RADIOLOGY REPORT BY [facility] **   CLINICAL DATA: Renal lesion seen on MR lumbar spine, initial encounter.  EXAM: CT ABDOMEN AND PELVIS WITHOUT AND WITH CONTRAST  TECHNIQUE: Multidetector CT imaging of the abdomen and pelvis was performed following the standard protocol before and following the bolus administration of intravenous contrast.  CONTRAST: 125 cc Isovue 300.  COMPARISON: MR lumbar spine 11/03/2014.  FINDINGS: Lower chest: Lung bases show 3 mm nodules in the right middle and right lower lobes (series 301, images 1 and 3), nonspecific. Mild dependent atelectasis bilaterally. Heart size normal. No pericardial or pleural effusion. 6 mm short axis lymph node at the diaphragmatic hiatus is not considered enlarged by CT size criteria.  Hepatobiliary: An intermediate density lesion in the right hepatic lobe is seen along the gallbladder fossa, measuring 1.8 cm and 42 Hounsfield units on precontrast imaging, 49 Hounsfield units on portal venous phase imaging and 67 Hounsfield units on nephrographic phase imaging. Millimetric enhancing foci in the hepatic dome are too small to definitively characterize but may represent flash fill hemangiomas. Liver and gallbladder are otherwise unremarkable. No biliary ductal dilatation.  Pancreas: Negative.  Spleen: Negative.  Adrenals/Urinary Tract: Adrenal glands are unremarkable. A heterogeneous enhancing mass in the interpolar right kidney measures 5.6 x 6.6 cm and extends slightly into the renal pelvis. No definitive renal vein invasion. 4 mm low-attenuation lesion in the upper pole left kidney is too small to characterize. No urinary stones. Visualized portions of the ureters are  decompressed. Linear high density is seen along the inferior wall of the bladder (series 4, image 153 and series 300, image 82 and series 302, image 90). Bladder is otherwise grossly unremarkable.  Stomach/Bowel: Stomach, small bowel and colon are unremarkable. Appendix appears to be surgically absent.  Vascular/Lymphatic: Atherosclerotic calcification of the arterial vasculature without abdominal aortic aneurysm. Low-attenuation lesions along the external iliac stations bilaterally measure up to 1.7 cm in short axis and 21 Hounsfield units on the left (series 4, image 143). No definite enhancement. Otherwise, no pathologically enlarged lymph nodes.  Reproductive: Prostatectomy.  Other: No free fluid. Small periumbilical hernia contains fat. Small bilateral inguinal hernias contain fat. Mesenteries and peritoneum are otherwise unremarkable.  Musculoskeletal: No worrisome lytic or sclerotic lesions. Metallic densities are seen in the spinal canal at the T12-L1 level.  IMPRESSION: 1. Heterogeneous enhancing right renal mass is most consistent with renal cell carcinoma. No definitive evidence of renal vein invasion or metastatic spread. 2. High density material along the inferior bladder wall may be related to prostatectomy. Difficult to definitively exclude transitional cell carcinoma, which can calcify. Please correlate clinically. 3. Tiny right middle and right lower lobe nodules, nonspecific. Attention on followup exams is warranted. 4. Intermediate density lesion in the right hepatic lobe shows sluggish enhancement and is indeterminate. While a nontypical hemangioma can have this appearance, a malignant lesion cannot be definitively excluded. If further evaluation is desired, MR abdomen without and with contrast is recommended. 5. Low-attenuation lesions along the external iliac stations bilaterally are nonspecific and  may be postoperative seromas. Attention on followup  exams is warranted. 6. Small periumbilical and small bilateral inguinal hernias contain fat.   Electronically Signed  By: Lorin Picket M.D.  On: 11/09/2014 09:50   CMP-CT [id number] 02:35PM Taylor Elliott  SPECIMEN TYPE: BLOOD   Test Name Result Flag Reference  GLUCOSE 99 mg/dL  70-99  SODIUM 137 mEq/L  135-145  POTASSIUM 4.5 mEq/L  3.5-5.3  CHLORIDE 103 mEq/L  96-112  CO2 25 mEq/L  19-32  CALCIUM 9.2 mg/dL  8.4-10.5  TOTAL PROTEIN 6.5 g/dL  6.0-8.3  ALBUMIN 4.2 g/dL  3.5-5.2  AST/SGOT 17 U/L  0-37  ALT/SGPT 23 U/L  0-53  ALKALINE PHOSPHATASE 92 U/L  39-117  BILIRUBIN, TOTAL 0.5 mg/dL  0.2-1.2   BUN & CREATININE 11Apr2016 02:35PM Taylor Elliott  SPECIMEN TYPE: BLOOD   Test Name Result Flag Reference  CREATININE 1.00 mg/dL  0.50-1.50  BUN 12 mg/dL  6-30  Est GFR, African American >89 mL/min    Est GFR, NonAfrican American 80 mL/min    PERFORMED AT:        [facility]                      [address]                      [place], [place] [address]   THE ESTIMATED GFR IS A CALCULATION VALID FOR ADULTS (>=54 YEARS OLD) THAT USES THE CKD-EPI ALGORITHM TO ADJUST FOR AGE AND SEX. IT IS   NOT TO BE USED FOR CHILDREN, PREGNANT WOMEN, HOSPITALIZED PATIENTS,    PATIENTS ON DIALYSIS, OR WITH RAPIDLY CHANGING KIDNEY FUNCTION. ACCORDING TO THE NKDEP, EGFR >89 IS NORMAL, 60-89 SHOWS MILD IMPAIRMENT, 30-59 SHOWS MODERATE IMPAIRMENT, 15-29 SHOWS SEVERE IMPAIRMENT AND <15 IS ESRD.    I have independently reviewed a CT scan of the abdomen and pelvis. He does have a large 6.6 cm centrally located right renal mass that is enhancing and highly suspicious for renal cell carcinoma. No regional lymphadenopathy, contralateral renal masses, adrenal masses, renal vein involvement, or other evidence of obvious metastatic disease is noted.    He does have a small 1.8 cm intermediate density lesion of the liver which is nonspecific and may represent an atypical hemangioma although  metastasis cannot be absolutely ruled out.    He has 2 subcentimeter pulmonary nodules.  Assessment Assessed  1. Renal mass (N28.89) 2. Pulmonary mass (R91.8)  Plan Health Maintenance  1. UA With REFLEX; [Do Not Release]; Status:Complete;   Done: [id number] 02:37PM Pulmonary mass, Renal mass  2. CT-CHEST W/O CONTRAST; Status:In Progress - Specimen/Data Collected;   Done:  13Apr2016 03:52PM Renal mass  3. Start: Zolpidem Tartrate 5 MG Oral Tablet (Ambien); TAKE 1 TABLET AT BEDTIME AS  NEEDED FOR SLEEP 4. Follow-up Office  Follow-up -will call to schedule surgery  Status: Hold For - Date of  Service  Requested for: 13Apr2016  Discussion/Summary 1. Enhancing right renal mass concerning for renal malignancy: He will complete his staging evaluation with a CT scan of the chest today. Considering the small pulmonary nodules noted on the lower cuts of the lung on his abdominal scan and considering his smoking history, I have recommended a full CT scan of the chest rather than a chest x-ray. This will serve as a baseline study going forward and he will need repeat pulmonary imaging in the future for surveillance considering the small pulmonary nodules noted in the setting  of a primary malignancy.   The patient was provided information regarding their renal mass including the relative risk of benign versus malignant pathology and the natural history of renal cell carcinoma and other possible malignancies of the kidney. The role of renal biopsy, laboratory testing, and imaging studies to further characterize renal masses and/or the presence of metastatic disease were explained. We discussed the role of active surveillance, surgical therapy with both radical nephrectomy and nephron-sparing surgery, and ablative therapy in the treatment of renal masses. In addition, we discussed our goals of providing an accurate diagnosis and oncologic control while maintaining optimal renal function as appropriate based  on the size, location, and complexity of their renal mass as well as their co-morbidities.    We have discussed the risks of treatment in detail including but not limited to bleeding, infection, heart attack, stroke, death, venothromoboembolism, cancer recurrence, injury/damage to surrounding organs and structures, urine leak, the possibility of open surgical conversion for patients undergoing minimally invasive surgery, the risk of developing chronic kidney disease and its associated implications, and the potential risk of end stage renal disease possibly necessitating dialysis.     I have discussed that his mass is centrally located and there is a small possibility it could represent a urothelial cancer although I certainly favor renal cell carcinoma based on its appearance. I have recommended that he proceed with cystoscopy and right retrograde pyelography to absolutely rule out a urothelial tumor followed by probable right laparoscopic radical nephrectomy. He understands that if this tumor is by chance a urothelial tumor that he would require a full nephroureterectomy. All questions were answered to his stated satisfaction and he will plan to proceed with surgical treatment next week. He did ask me if he should proceed with further neurosurgical evaluation and is scheduled considering the findings on his lumbar MR. I did recommend that he proceed with his consultation with Dr. Ellene Route understanding that he still should plan to proceed with his laparoscopic radical nephrectomy next week for treatment of his renal cell carcinoma.    2. Indeterminate hepatic lesion: This will require further evaluation and I will consider MR imaging following his upcoming renal surgery.    3. Prostate cancer: He is scheduled for ongoing PSA surveillance in August.    4. Incontinence: This is improved with Vesicare 10 mg.    5. Erectile dysfunction: We have previously discussed having him troubleshoot concerns  about his vacuum erection device with a device representative. We will certainly reevaluate this in the future.    Cc: Dr. Bernerd Limbo  Dr. Meridee Score  Dr. Kristeen Miss   A total of 65 minutes were spent in the overall care of the patient today with 55 minutes in direct face to face consultation.    Verified Results CT-CHEST W/O CONTRAST1 14Apr2016 12:00AM1 [employer]   Test Name Result Flag Reference  CT-CHEST W/O CONTRAST1 (Report)1    ** RADIOLOGY REPORT BY [facility] **   CLINICAL DATA: Right renal mass concerning for renal cell carcinoma with small pulmonary nodules noted on CT of the abdomen. Evaluate for metastatic disease. Prostate cancer with prostatectomy in 2015.  EXAM: CT CHEST WITHOUT CONTRAST  TECHNIQUE: Multidetector CT imaging of the chest was performed following the standard protocol without IV contrast..  COMPARISON: 11/08/2014 abdominal pelvic CT. Chest radiograph of 04/23/2011.  FINDINGS: Mediastinum/Nodes: Aortic and branch vessel atherosclerosis. Heart size upper normal, without pericardial effusion. No supraclavicular adenopathy. No mediastinal or definite hilar adenopathy, given limitations of  unenhanced CT.  Lungs/Pleura: No pleural fluid. Mild centrilobular emphysema.  Tiny pulmonary nodules, on the order of 1-2 mm. Examples in the right upper lobe on images 15 and 19, right middle lobe on image 29, right lower lobe on images 30 and 33, left upper lobe on image 15. Primarily subpleural in position.  Upper abdomen: No interval change since dedicated CT of 2 days prior.  Musculoskeletal: No acute osseous abnormality.  IMPRESSION: 1. Scattered pulmonary nodules, on the order of 2 mm. Primarily subpleural in distribution. Technically indeterminate. Felt unlikely to represent metastatic disease. Favored to represent subpleural lymph nodes. Consider followup with chest CT at approximately 6 months. 2. Mild centrilobular  emphysema.   Electronically Signed  By: Abigail Miyamoto M.D.  On: 11/11/2014 09:02     1. Amended By: Taylor Elliott; Nov 11 2014 9:37 AM EST  Signatures Electronically signed by : Taylor Elliott, M.D.; Nov 11 2014  9:37AM EST

## 2014-11-18 ENCOUNTER — Inpatient Hospital Stay (HOSPITAL_COMMUNITY): Payer: 59

## 2014-11-18 ENCOUNTER — Inpatient Hospital Stay (HOSPITAL_COMMUNITY): Payer: 59 | Admitting: Anesthesiology

## 2014-11-18 ENCOUNTER — Inpatient Hospital Stay (HOSPITAL_COMMUNITY)
Admission: RE | Admit: 2014-11-18 | Discharge: 2014-11-20 | DRG: 658 | Disposition: A | Discharge status: Home / Self Care | Payer: 59 | Source: Ambulatory Visit | Attending: Urology | Admitting: Urology

## 2014-11-18 ENCOUNTER — Encounter (HOSPITAL_COMMUNITY)
Admission: RE | Disposition: A | Discharge status: Home / Self Care | Payer: Self-pay | Source: Ambulatory Visit | Attending: Urology

## 2014-11-18 ENCOUNTER — Encounter (HOSPITAL_COMMUNITY): Payer: Self-pay | Admitting: *Deleted

## 2014-11-18 DIAGNOSIS — Z9049 Acquired absence of other specified parts of digestive tract: Secondary | ICD-10-CM | POA: Diagnosis present

## 2014-11-18 DIAGNOSIS — C641 Malignant neoplasm of right kidney, except renal pelvis: Secondary | ICD-10-CM | POA: Diagnosis present

## 2014-11-18 DIAGNOSIS — F17210 Nicotine dependence, cigarettes, uncomplicated: Secondary | ICD-10-CM | POA: Diagnosis present

## 2014-11-18 DIAGNOSIS — I10 Essential (primary) hypertension: Secondary | ICD-10-CM | POA: Diagnosis present

## 2014-11-18 DIAGNOSIS — Z8546 Personal history of malignant neoplasm of prostate: Secondary | ICD-10-CM

## 2014-11-18 DIAGNOSIS — Z7982 Long term (current) use of aspirin: Secondary | ICD-10-CM | POA: Diagnosis not present

## 2014-11-18 DIAGNOSIS — Z01812 Encounter for preprocedural laboratory examination: Secondary | ICD-10-CM | POA: Diagnosis not present

## 2014-11-18 DIAGNOSIS — N2889 Other specified disorders of kidney and ureter: Secondary | ICD-10-CM | POA: Diagnosis present

## 2014-11-18 DIAGNOSIS — K219 Gastro-esophageal reflux disease without esophagitis: Secondary | ICD-10-CM | POA: Diagnosis present

## 2014-11-18 DIAGNOSIS — Z801 Family history of malignant neoplasm of trachea, bronchus and lung: Secondary | ICD-10-CM

## 2014-11-18 DIAGNOSIS — K429 Umbilical hernia without obstruction or gangrene: Secondary | ICD-10-CM | POA: Diagnosis present

## 2014-11-18 DIAGNOSIS — D49519 Neoplasm of unspecified behavior of unspecified kidney: Secondary | ICD-10-CM | POA: Diagnosis present

## 2014-11-18 DIAGNOSIS — Z79899 Other long term (current) drug therapy: Secondary | ICD-10-CM | POA: Diagnosis not present

## 2014-11-18 HISTORY — PX: LAPAROSCOPIC NEPHRECTOMY: SHX1930

## 2014-11-18 HISTORY — PX: CYSTOSCOPY W/ RETROGRADES: SHX1426

## 2014-11-18 LAB — TYPE AND SCREEN
ABO/RH(D): A POS
Antibody Screen: NEGATIVE

## 2014-11-18 LAB — BASIC METABOLIC PANEL WITH GFR
Anion gap: 7 (ref 5–15)
BUN: 12 mg/dL (ref 6–23)
CO2: 27 mmol/L (ref 19–32)
Calcium: 8.6 mg/dL (ref 8.4–10.5)
Chloride: 103 mmol/L (ref 96–112)
Creatinine, Ser: 1.06 mg/dL (ref 0.50–1.35)
GFR calc Af Amer: 85 mL/min — ABNORMAL LOW
GFR calc non Af Amer: 73 mL/min — ABNORMAL LOW
Glucose, Bld: 170 mg/dL — ABNORMAL HIGH (ref 70–99)
Potassium: 4.6 mmol/L (ref 3.5–5.1)
Sodium: 137 mmol/L (ref 135–145)

## 2014-11-18 LAB — HEMOGLOBIN AND HEMATOCRIT, BLOOD
HCT: 45.2 % (ref 39.0–52.0)
Hemoglobin: 15.2 g/dL (ref 13.0–17.0)

## 2014-11-18 SURGERY — NEPHRECTOMY, RADICAL, LAPAROSCOPIC, ADULT
Anesthesia: General | Laterality: Right

## 2014-11-18 MED ORDER — SODIUM CHLORIDE 0.9 % IJ SOLN
INTRAMUSCULAR | Status: DC | PRN
  Administered 2014-11-18: 20 mL

## 2014-11-18 MED ORDER — AMLODIPINE BESYLATE 10 MG PO TABS
10.0000 mg | ORAL_TABLET | Freq: Every morning | ORAL | Status: DC
  Administered 2014-11-19 – 2014-11-20 (×2): 10 mg via ORAL
  Filled 2014-11-18 (×2): qty 1

## 2014-11-18 MED ORDER — NEOSTIGMINE METHYLSULFATE 10 MG/10ML IV SOLN
INTRAVENOUS | Status: DC | PRN
  Administered 2014-11-18: 4 mg via INTRAVENOUS

## 2014-11-18 MED ORDER — MIDAZOLAM HCL 5 MG/5ML IJ SOLN
INTRAMUSCULAR | Status: DC | PRN
  Administered 2014-11-18: 2 mg via INTRAVENOUS

## 2014-11-18 MED ORDER — PROPOFOL 10 MG/ML IV BOLUS
INTRAVENOUS | Status: AC
  Filled 2014-11-18: qty 20

## 2014-11-18 MED ORDER — ONDANSETRON HCL 4 MG/2ML IJ SOLN
4.0000 mg | INTRAMUSCULAR | Status: DC | PRN
Start: 2014-11-18 — End: 2014-11-20

## 2014-11-18 MED ORDER — LACTATED RINGERS IR SOLN
Status: DC | PRN
  Administered 2014-11-18: 1000 mL

## 2014-11-18 MED ORDER — ACETAMINOPHEN 10 MG/ML IV SOLN
1000.0000 mg | Freq: Four times a day (QID) | INTRAVENOUS | Status: DC
  Administered 2014-11-18 – 2014-11-19 (×3): 1000 mg via INTRAVENOUS
  Filled 2014-11-18 (×6): qty 100

## 2014-11-18 MED ORDER — MIDAZOLAM HCL 2 MG/2ML IJ SOLN
INTRAMUSCULAR | Status: AC
  Filled 2014-11-18: qty 2

## 2014-11-18 MED ORDER — ROSUVASTATIN CALCIUM 10 MG PO TABS
10.0000 mg | ORAL_TABLET | Freq: Every morning | ORAL | Status: DC
  Administered 2014-11-19 – 2014-11-20 (×2): 10 mg via ORAL
  Filled 2014-11-18 (×2): qty 1

## 2014-11-18 MED ORDER — CISATRACURIUM BESYLATE 20 MG/10ML IV SOLN
INTRAVENOUS | Status: AC
  Filled 2014-11-18: qty 10

## 2014-11-18 MED ORDER — DIPHENHYDRAMINE HCL 50 MG/ML IJ SOLN: 12.5000 mg | Freq: Four times a day (QID) | INTRAMUSCULAR | Status: DC | PRN

## 2014-11-18 MED ORDER — SUCCINYLCHOLINE CHLORIDE 20 MG/ML IJ SOLN
INTRAMUSCULAR | Status: DC | PRN
  Administered 2014-11-18: 100 mg via INTRAVENOUS

## 2014-11-18 MED ORDER — NEOSTIGMINE METHYLSULFATE 10 MG/10ML IV SOLN
INTRAVENOUS | Status: AC
  Filled 2014-11-18: qty 1

## 2014-11-18 MED ORDER — HYDROMORPHONE HCL 1 MG/ML IJ SOLN
0.5000 mg | INTRAMUSCULAR | Status: DC | PRN
  Administered 2014-11-18 – 2014-11-19 (×7): 1 mg via INTRAVENOUS
  Filled 2014-11-18 (×7): qty 1

## 2014-11-18 MED ORDER — EPHEDRINE SULFATE 50 MG/ML IJ SOLN
INTRAMUSCULAR | Status: DC | PRN
  Administered 2014-11-18: 15 mg via INTRAVENOUS

## 2014-11-18 MED ORDER — HYDROMORPHONE HCL 1 MG/ML IJ SOLN
INTRAMUSCULAR | Status: AC
  Filled 2014-11-18: qty 1

## 2014-11-18 MED ORDER — CEFAZOLIN SODIUM 1-5 GM-% IV SOLN
1.0000 g | Freq: Three times a day (TID) | INTRAVENOUS | Status: AC
  Administered 2014-11-18 (×2): 1 g via INTRAVENOUS
  Filled 2014-11-18 (×2): qty 50

## 2014-11-18 MED ORDER — FENTANYL CITRATE (PF) 250 MCG/5ML IJ SOLN
INTRAMUSCULAR | Status: AC
  Filled 2014-11-18: qty 5

## 2014-11-18 MED ORDER — BUPIVACAINE LIPOSOME 1.3 % IJ SUSP
INTRAMUSCULAR | Status: DC | PRN
  Administered 2014-11-18: 40 mL

## 2014-11-18 MED ORDER — CEFAZOLIN SODIUM-DEXTROSE 2-3 GM-% IV SOLR
INTRAVENOUS | Status: AC
  Filled 2014-11-18: qty 50

## 2014-11-18 MED ORDER — BUPIVACAINE LIPOSOME 1.3 % IJ SUSP
20.0000 mL | Freq: Once | INTRAMUSCULAR | Status: DC
  Filled 2014-11-18: qty 20

## 2014-11-18 MED ORDER — LACTATED RINGERS IV SOLN
INTRAVENOUS | Status: DC | PRN
  Administered 2014-11-18 (×3): via INTRAVENOUS

## 2014-11-18 MED ORDER — FENTANYL CITRATE (PF) 250 MCG/5ML IJ SOLN
INTRAMUSCULAR | Status: DC | PRN
  Administered 2014-11-18: 100 ug via INTRAVENOUS
  Administered 2014-11-18 (×3): 50 ug via INTRAVENOUS

## 2014-11-18 MED ORDER — GLYCOPYRROLATE 0.2 MG/ML IJ SOLN
INTRAMUSCULAR | Status: DC | PRN
  Administered 2014-11-18: 0.6 mg via INTRAVENOUS

## 2014-11-18 MED ORDER — DEXAMETHASONE SODIUM PHOSPHATE 10 MG/ML IJ SOLN
INTRAMUSCULAR | Status: AC
  Filled 2014-11-18: qty 1

## 2014-11-18 MED ORDER — LACTATED RINGERS IV SOLN: INTRAVENOUS | Status: DC

## 2014-11-18 MED ORDER — ALLOPURINOL 100 MG PO TABS
200.0000 mg | ORAL_TABLET | Freq: Every morning | ORAL | Status: DC
  Administered 2014-11-19 – 2014-11-20 (×2): 200 mg via ORAL
  Filled 2014-11-18 (×2): qty 2

## 2014-11-18 MED ORDER — GLYCOPYRROLATE 0.2 MG/ML IJ SOLN
INTRAMUSCULAR | Status: AC
  Filled 2014-11-18: qty 3

## 2014-11-18 MED ORDER — CISATRACURIUM BESYLATE (PF) 10 MG/5ML IV SOLN
INTRAVENOUS | Status: DC | PRN
  Administered 2014-11-18: 4 mg via INTRAVENOUS
  Administered 2014-11-18: 6 mg via INTRAVENOUS
  Administered 2014-11-18 (×2): 4 mg via INTRAVENOUS

## 2014-11-18 MED ORDER — DOCUSATE SODIUM 100 MG PO CAPS
100.0000 mg | ORAL_CAPSULE | Freq: Two times a day (BID) | ORAL | Status: DC
  Administered 2014-11-18 – 2014-11-20 (×4): 100 mg via ORAL
  Filled 2014-11-18 (×4): qty 1

## 2014-11-18 MED ORDER — EPHEDRINE SULFATE 50 MG/ML IJ SOLN
INTRAMUSCULAR | Status: AC
  Filled 2014-11-18: qty 1

## 2014-11-18 MED ORDER — DEXTROSE-NACL 5-0.45 % IV SOLN
INTRAVENOUS | Status: DC
  Administered 2014-11-18 – 2014-11-19 (×3): via INTRAVENOUS

## 2014-11-18 MED ORDER — DOCUSATE SODIUM 100 MG PO CAPS: 100.0000 mg | ORAL_CAPSULE | Freq: Two times a day (BID) | ORAL | Status: DC

## 2014-11-18 MED ORDER — FAMOTIDINE 20 MG PO TABS
20.0000 mg | ORAL_TABLET | Freq: Every day | ORAL | Status: DC
  Administered 2014-11-19: 20 mg via ORAL
  Filled 2014-11-18 (×2): qty 1

## 2014-11-18 MED ORDER — PROPOFOL 10 MG/ML IV BOLUS
INTRAVENOUS | Status: DC | PRN
  Administered 2014-11-18: 200 mg via INTRAVENOUS

## 2014-11-18 MED ORDER — DEXAMETHASONE SODIUM PHOSPHATE 10 MG/ML IJ SOLN
INTRAMUSCULAR | Status: DC | PRN
  Administered 2014-11-18: 10 mg via INTRAVENOUS

## 2014-11-18 MED ORDER — ONDANSETRON HCL 4 MG/2ML IJ SOLN: 4.0000 mg | Freq: Once | INTRAMUSCULAR | Status: DC | PRN

## 2014-11-18 MED ORDER — HYDROMORPHONE HCL 1 MG/ML IJ SOLN
0.2500 mg | INTRAMUSCULAR | Status: DC | PRN
  Administered 2014-11-18 (×4): 0.25 mg via INTRAVENOUS

## 2014-11-18 MED ORDER — HYDROMORPHONE HCL 2 MG/ML IJ SOLN
INTRAMUSCULAR | Status: AC
  Filled 2014-11-18: qty 1

## 2014-11-18 MED ORDER — DIPHENHYDRAMINE HCL 12.5 MG/5ML PO ELIX: 12.5000 mg | ORAL_SOLUTION | Freq: Four times a day (QID) | ORAL | Status: DC | PRN

## 2014-11-18 MED ORDER — SODIUM CHLORIDE 0.9 % IR SOLN
Status: DC | PRN
  Administered 2014-11-18: 3000 mL

## 2014-11-18 MED ORDER — SODIUM CHLORIDE 0.9 % IJ SOLN
INTRAMUSCULAR | Status: AC
  Filled 2014-11-18: qty 50

## 2014-11-18 MED ORDER — ONDANSETRON HCL 4 MG/2ML IJ SOLN
INTRAMUSCULAR | Status: AC
  Filled 2014-11-18: qty 2

## 2014-11-18 MED ORDER — HYDROMORPHONE HCL 1 MG/ML IJ SOLN
INTRAMUSCULAR | Status: DC | PRN
  Administered 2014-11-18 (×4): 0.5 mg via INTRAVENOUS

## 2014-11-18 MED ORDER — OXYCODONE-ACETAMINOPHEN 5-325 MG PO TABS: 1.0000 | ORAL_TABLET | ORAL | Status: DC | PRN

## 2014-11-18 MED ORDER — CEFAZOLIN SODIUM-DEXTROSE 2-3 GM-% IV SOLR
2.0000 g | INTRAVENOUS | Status: AC
  Administered 2014-11-18: 2 g via INTRAVENOUS

## 2014-11-18 MED ORDER — ONDANSETRON HCL 4 MG/2ML IJ SOLN
INTRAMUSCULAR | Status: DC | PRN
  Administered 2014-11-18: 4 mg via INTRAVENOUS

## 2014-11-18 MED ORDER — DARIFENACIN HYDROBROMIDE ER 15 MG PO TB24
15.0000 mg | ORAL_TABLET | Freq: Every day | ORAL | Status: DC
Start: 2014-11-19 — End: 2014-11-19
  Filled 2014-11-18: qty 1

## 2014-11-18 MED ORDER — IOHEXOL 300 MG/ML  SOLN
INTRAMUSCULAR | Status: DC | PRN
  Administered 2014-11-18: 10 mL via URETHRAL

## 2014-11-18 SURGICAL SUPPLY — 61 items
BAG URO CATCHER STRL LF (DRAPE) ×3 IMPLANT
BAG ZIPLOCK 12X15 (MISCELLANEOUS) IMPLANT
BLADE EXTENDED COATED 6.5IN (ELECTRODE) IMPLANT
BLADE SURG SZ10 CARB STEEL (BLADE) ×3 IMPLANT
CATH INTERMIT  6FR 70CM (CATHETERS) ×3 IMPLANT
CHLORAPREP W/TINT 26ML (MISCELLANEOUS) ×3 IMPLANT
CLIP LIGATING HEM O LOK PURPLE (MISCELLANEOUS) ×3 IMPLANT
CLIP LIGATING HEMO LOK XL GOLD (MISCELLANEOUS) ×3 IMPLANT
CLIP LIGATING HEMO O LOK GREEN (MISCELLANEOUS) ×3 IMPLANT
COVER SURGICAL LIGHT HANDLE (MISCELLANEOUS) ×3 IMPLANT
CUTTER FLEX LINEAR 45M (STAPLE) ×3 IMPLANT
DRAIN CHANNEL 10F 3/8 F FF (DRAIN) IMPLANT
DRAPE INCISE IOBAN 66X45 STRL (DRAPES) ×3 IMPLANT
DRAPE LAPAROSCOPIC ABDOMINAL (DRAPES) ×3 IMPLANT
DRAPE WARM FLUID 44X44 (DRAPE) ×3 IMPLANT
DRSG TEGADERM 4X4.75 (GAUZE/BANDAGES/DRESSINGS) ×3 IMPLANT
ELECT REM PT RETURN 9FT ADLT (ELECTROSURGICAL) ×3
ELECTRODE REM PT RTRN 9FT ADLT (ELECTROSURGICAL) ×1 IMPLANT
EVACUATOR SILICONE 100CC (DRAIN) IMPLANT
GLOVE BIO SURGEON STRL SZ 6.5 (GLOVE) ×2 IMPLANT
GLOVE BIO SURGEONS STRL SZ 6.5 (GLOVE) ×1
GLOVE BIOGEL M STRL SZ7.5 (GLOVE) ×36 IMPLANT
GOWN STRL REUS W/TWL LRG LVL3 (GOWN DISPOSABLE) ×24 IMPLANT
GUIDEWIRE ANG ZIPWIRE 038X150 (WIRE) IMPLANT
GUIDEWIRE STR DUAL SENSOR (WIRE) ×3 IMPLANT
HEMOSTAT SURGICEL 4X8 (HEMOSTASIS) IMPLANT
KIT BASIN OR (CUSTOM PROCEDURE TRAY) ×3 IMPLANT
LIQUID BAND (GAUZE/BANDAGES/DRESSINGS) ×3 IMPLANT
MANIFOLD NEPTUNE II (INSTRUMENTS) ×3 IMPLANT
PACK CYSTO (CUSTOM PROCEDURE TRAY) ×3 IMPLANT
PENCIL BUTTON HOLSTER BLD 10FT (ELECTRODE) ×3 IMPLANT
POSITIONER SURGICAL ARM (MISCELLANEOUS) ×3 IMPLANT
POUCH ENDO CATCH II 15MM (MISCELLANEOUS) ×3 IMPLANT
RELOAD 45 VASCULAR/THIN (ENDOMECHANICALS) ×3 IMPLANT
RETRACTOR LAPSCP 12X46 CVD (ENDOMECHANICALS) IMPLANT
RTRCTR LAPSCP 12X46 CVD (ENDOMECHANICALS)
SCISSORS LAP 5X35 DISP (ENDOMECHANICALS) IMPLANT
SET IRRIG TUBING LAPAROSCOPIC (IRRIGATION / IRRIGATOR) ×3 IMPLANT
SHEARS HARMONIC ACE PLUS 36CM (ENDOMECHANICALS) ×3 IMPLANT
SPONGE LAP 18X18 X RAY DECT (DISPOSABLE) IMPLANT
SPONGE SURGIFOAM ABS GEL 100 (HEMOSTASIS) IMPLANT
SURGIFLO W/THROMBIN 8M KIT (HEMOSTASIS) IMPLANT
SUT ETHILON 3 0 PS 1 (SUTURE) IMPLANT
SUT MNCRL AB 4-0 PS2 18 (SUTURE) ×6 IMPLANT
SUT NOVA 0 T19/GS 22DT (SUTURE) ×3 IMPLANT
SUT NOVA NAB GS-21 1 T12 (SUTURE) ×6 IMPLANT
SUT PDS AB 1 CTX 36 (SUTURE) ×3 IMPLANT
SUT VIC AB 2-0 SH 27 (SUTURE)
SUT VIC AB 2-0 SH 27X BRD (SUTURE) IMPLANT
SUT VICRYL 0 UR6 27IN ABS (SUTURE) ×3 IMPLANT
TOWEL OR 17X26 10 PK STRL BLUE (TOWEL DISPOSABLE) ×6 IMPLANT
TRAY FOLEY W/METER SILVER 14FR (SET/KITS/TRAYS/PACK) ×3 IMPLANT
TRAY LAPAROSCOPIC (CUSTOM PROCEDURE TRAY) ×3 IMPLANT
TROCAR BLADELESS OPT 5 100 (ENDOMECHANICALS) ×3 IMPLANT
TROCAR BLADELESS OPT 5 75 (ENDOMECHANICALS) ×3 IMPLANT
TROCAR XCEL 12X100 BLDLESS (ENDOMECHANICALS) ×3 IMPLANT
TROCAR XCEL BLUNT TIP 100MML (ENDOMECHANICALS) ×3 IMPLANT
TUBING CONNECTING 10 (TUBING) ×2 IMPLANT
TUBING CONNECTING 10' (TUBING) ×1
TUBING INSUFFLATION 10FT LAP (TUBING) ×3 IMPLANT
YANKAUER SUCT BULB TIP 10FT TU (MISCELLANEOUS) ×3 IMPLANT

## 2014-11-18 NOTE — Anesthesia Postprocedure Evaluation (Signed)
  Anesthesia Post-op Note  Patient: Taylor Elliott  Procedure(s) Performed: Procedure(s) (LRB): LAPAROSCOPIC RADICAL NEPHRECTOMY (Right) CYSTOSCOPY WITH RETROGRADE PYELOGRAM (Right)  Patient Location: PACU  Anesthesia Type: General  Level of Consciousness: awake and alert   Airway and Oxygen Therapy: Patient Spontanous Breathing  Post-op Pain: mild  Post-op Assessment: Post-op Vital signs reviewed, Patient's Cardiovascular Status Stable, Respiratory Function Stable, Patent Airway and No signs of Nausea or vomiting  Last Vitals:  Filed Vitals:   11/18/14 1146  BP: 136/60  Pulse: 83  Temp: 36.4 C  Resp: 16    Post-op Vital Signs: stable   Complications: No apparent anesthesia complications]

## 2014-11-18 NOTE — Anesthesia Procedure Notes (Signed)
Procedure Name: Intubation Date/Time: 11/18/2014 7:30 AM Performed by: Dione Booze Pre-anesthesia Checklist: Patient identified, Emergency Drugs available, Patient being monitored and Suction available Patient Re-evaluated:Patient Re-evaluated prior to inductionOxygen Delivery Method: Circle system utilized Preoxygenation: Pre-oxygenation with 100% oxygen Intubation Type: IV induction Laryngoscope Size: Mac and 4 Grade View: Grade II Tube type: Oral Tube size: 7.5 mm Number of attempts: 1 Airway Equipment and Method: Stylet Placement Confirmation: ETT inserted through vocal cords under direct vision,  positive ETCO2 and breath sounds checked- equal and bilateral Secured at: 22 cm Tube secured with: Tape Dental Injury: Teeth and Oropharynx as per pre-operative assessment  Comments: Small cut to lt upper lip.

## 2014-11-18 NOTE — Op Note (Signed)
Preoperative diagnosis: Right renal mass, umbilical hernia  Postoperative diagnosis: Right renal mass, umbilical hernia  Procedure: 1.  Cystoscopy 2. Right retrograde pyelography with interpretation 3. Right laparoscopic radical nephrectomy 4. Repair of umbilical hernia  Surgeon: Pryor Curia. M.D.  Assistant(s): None  Resident: Dr. Amaryllis Dyke  Anesthesia: General  Complications: None  EBL: 100 mL  IVF:  2000 mL crystalloid  Specimens: 1. Right kidney  Disposition of specimens: Pathology  Indication: Taylor Elliott is a 64 y.o. patient with a right renal tumor suspicious for malignancy.  After a thorough review of the management options for their renal mass, they elected to proceed with surgical treatment and the above procedure.  We have discussed the potential benefits and risks of the procedure, side effects of the proposed treatment, the likelihood of the patient achieving the goals of the procedure, and any potential problems that might occur during the procedure or recuperation. Informed consent has been obtained.  Description of procedure:  The patient was taken to the operating room and a general anesthetic was administered. The patient was given preoperative antibiotics, placed in the dorsal lithotomy position and prepped and draped a preoperative time out was performed.  Due to the central location of the renal tumor, it was decided to perform retrograde pyelography to ensure there was no concern about a urothelial malignancy.  Cystourethrosocpy was performed.  The urethra was unremarkable.  The prostate was surgically absent.  The bladder demonstrated the ureteral orifices to be in their expected anatomic location and effluxing clear urine.  No bladder tumors, stones or other abnormal mucosal abnormalities were noted. A 6 Fr ureteral catheter was then used to inject omnipaque contrast into the right ureter.  This demonstrated a normal caliber ureter without  filling defects.  The renal collecting system was splayed due to extrinsic mass effect but no intracollecting system filling defects were noted. A Foley catheter was then placed and the patient was repositioned and placed in the right modified flank position, and prepped and draped in the usual sterile fashion. Next another preoperative timeout was performed.  A site was selected near the umbilicus for placement of the camera port. This was placed using a standard open Hassan technique which allowed entry into the peritoneal cavity under direct vision and without difficulty. A 12 mm Hassan cannula was placed and a pneumoperitoneum established. The camera was then used to inspect the abdomen and there was no evidence of any intra-abdominal injuries or other abnormalities. The remaining abdominal ports were then placed. A 12 mm port was placed in the right lower quadrant and a 5 mm port was placed in the right upper quadrant.  All ports were placed under direct vision without difficulty.  Utilizing the harmonic scalpel, the white line of Toldt was incised allowing the colon to be mobilized medially and the plane between the mesocolon and the anterior layer of Gerota's fascia to be developed and the kidney exposed.  The ureter and gonadal vein were identified inferiorly and the ureter was lifted anteriorly off the psoas muscle.  Dissection proceeded superiorly along the gonadal vein until the renal vein was identified.  The renal hilum was then carefully isolated with a combination of blunt and sharp dissectiong allowing the renal arterial and venous structures to be separated and isolated.   The renal artery was isolated and ligated with multiple Weck clips and subsequently divided.  The renal vein was then isolated and also ligated and divided with a 45 mm Flex  ETS stapler.  Gerota's fascia was intentionally entered superiorly and the space between the adrenal gland and the kidney was developed allowing the  adrenal gland to be spared.  The hepatorenal ligaments were divided with the harmonic scalpel.  The lateral and posterior attachements to the kidney were then divided.  The ureter was ligated with Weck clips and divided allowing the specimen to be freed from all surrounding structures.  The kidney specimen was then placed into a 15 mm Endocatch II retrieval bag.  The renal hilum, liver, adrenal bed and gonadal vein areas were each inspected and hemostasis was ensured with the pneomperitoneal pressures lowered.  The 12 mm lower quadrant port was then closed with a 0-vicryl suture placed laparoscopically to close the fascia of this incision. All remaining ports were removed under direct vision.  The kidney specimen was removed intact within the retrieval bag via the camera port site after this incision was extended inferiorly.  The patient was noted to have an umbilical hernia and the fascial opening was carried around the umbilicus and the hernia was opened in continuity with the fascial opening from the surgical incision. The entire fascial opening was then closed with two #1 Novofil sutures resulting in repair of the umbilical hernia defect.  All incisions were injected with local anesthetic and reapproximated at the skin with 4-0 monocryl sutures.  Dermabond was applied to the skin. The patient tolerated the procedure well and without complications and was transferred to the recovery unit in satisfactory condition.   Pryor Curia MD

## 2014-11-18 NOTE — Discharge Instructions (Signed)
1.  Activity:  You are encouraged to ambulate frequently (about every hour during waking hours) to help prevent blood clots from forming in your legs or lungs.  However, you should not engage in any heavy lifting (> 10-15 lbs), strenuous activity, or straining. °2. Diet: You should advance your diet as instructed by your physician.  It will be normal to have some bloating, nausea, and abdominal discomfort intermittently. °3. Prescriptions:  You will be provided a prescription for pain medication to take as needed.  If your pain is not severe enough to require the prescription pain medication, you may take extra strength Tylenol instead which will have less side effects.  You should also take a prescribed stool softener to avoid straining with bowel movements as the prescription pain medication may constipate you. °4. Incisions: You may remove your dressing bandages 48 hours after surgery if not removed in the hospital.  You will either have some small staples or special tissue glue at each of the incision sites. Once the bandages are removed (if present), the incisions may stay open to air.  You may start showering (but not soaking or bathing in water) the 2nd day after surgery and the incisions simply need to be patted dry after the shower.  No additional care is needed. °5. What to call us about: You should call the office ([phone]) if you develop fever > 101 or develop persistent vomiting. °

## 2014-11-18 NOTE — Progress Notes (Signed)
Patient ID: Taylor Elliott, male   DOB: 1950/08/21, 64 y.o.   MRN: [id number]  Post-op note  Subjective: The patient is doing well.  No complaints.  Objective: Vital signs in last 24 hours: Temp:  [97.4 F (36.3 C)-98.5 F (36.9 C)] 97.5 F (36.4 C) (04/21 1146) Pulse Rate:  [76-89] 83 (04/21 1146) Resp:  [11-18] 16 (04/21 1146) BP: (113-150)/(55-74) 136/60 mmHg (04/21 1146) SpO2:  [91 %-100 %] 97 % (04/21 1146) Weight:  [87.544 kg (193 lb)] 87.544 kg (193 lb) (04/21 0524)  Intake/Output from previous day:   Intake/Output this shift: Total I/O In: 2600 [I.V.:2500; IV Piggyback:100] Out: 325 [Urine:225; Blood:100]  Physical Exam:  General: Alert and oriented. Abdomen: Soft, Nondistended. Incisions: Clean and dry.  Lab Results:  Recent Labs  11/18/14 1055  HGB 15.2  HCT 45.2    Assessment/Plan: POD#0   1) Continue to monitor   Pryor Curia. MD   LOS: 0 days   Randy Castrejon,LES 11/18/2014, 6:35 PM

## 2014-11-18 NOTE — Transfer of Care (Signed)
Immediate Anesthesia Transfer of Care Note  Patient: Taylor Elliott  Procedure(s) Performed: Procedure(s): LAPAROSCOPIC RADICAL NEPHRECTOMY (Right) CYSTOSCOPY WITH RETROGRADE PYELOGRAM (Right)  Patient Location: PACU  Anesthesia Type:General  Level of Consciousness: awake, alert , oriented and patient cooperative  Airway & Oxygen Therapy: Patient Spontanous Breathing and Patient connected to face mask oxygen  Post-op Assessment: Report given to RN and Post -op Vital signs reviewed and stable  Post vital signs: Reviewed and stable  Last Vitals:  Filed Vitals:   11/18/14 0510  BP: 128/56  Pulse: 89  Temp: 36.9 C  Resp: 18    Complications: No apparent anesthesia complications

## 2014-11-18 NOTE — Interval H&P Note (Signed)
History and Physical Interval Note:  11/18/2014 7:12 AM  Taylor Elliott  has presented today for surgery, with the diagnosis of RIGHT RENAL MASS  The various methods of treatment have been discussed with the patient and family. After consideration of risks, benefits and other options for treatment, the patient has consented to  Procedure(s): LAPAROSCOPIC RADICAL NEPHRECTOMY (Right) CYSTOSCOPY WITH RETROGRADE PYELOGRAM (Right) as a surgical intervention .  The patient's history has been reviewed, patient examined, no change in status, stable for surgery.  I have reviewed the patient's chart and labs.  Questions were answered to the patient's satisfaction.     [name],LES

## 2014-11-19 ENCOUNTER — Encounter (HOSPITAL_COMMUNITY): Payer: Self-pay | Admitting: Urology

## 2014-11-19 LAB — BASIC METABOLIC PANEL
Anion gap: 5 (ref 5–15)
BUN: 11 mg/dL (ref 6–23)
CO2: 28 mmol/L (ref 19–32)
Calcium: 8.7 mg/dL (ref 8.4–10.5)
Chloride: 102 mmol/L (ref 96–112)
Creatinine, Ser: 1.26 mg/dL (ref 0.50–1.35)
GFR calc Af Amer: 69 mL/min — ABNORMAL LOW (ref >90)
GFR calc non Af Amer: 59 mL/min — ABNORMAL LOW (ref >90)
Glucose, Bld: 166 mg/dL — ABNORMAL HIGH (ref 70–99)
Potassium: 4.6 mmol/L (ref 3.5–5.1)
Sodium: 135 mmol/L (ref 135–145)

## 2014-11-19 LAB — HEMOGLOBIN AND HEMATOCRIT, BLOOD
HCT: 38.9 % — ABNORMAL LOW (ref 39.0–52.0)
Hemoglobin: 13 g/dL (ref 13.0–17.0)

## 2014-11-19 MED ORDER — BISACODYL 10 MG RE SUPP
10.0000 mg | Freq: Once | RECTAL | Status: AC
  Administered 2014-11-19: 10 mg via RECTAL
  Filled 2014-11-19: qty 1

## 2014-11-19 MED ORDER — OXYCODONE-ACETAMINOPHEN 5-325 MG PO TABS
1.0000 | ORAL_TABLET | ORAL | Status: DC | PRN
  Administered 2014-11-19 – 2014-11-20 (×5): 2 via ORAL
  Filled 2014-11-19 (×5): qty 2

## 2014-11-19 MED ORDER — HYDROCODONE-ACETAMINOPHEN 5-325 MG PO TABS
1.0000 | ORAL_TABLET | Freq: Four times a day (QID) | ORAL | Status: DC | PRN
  Administered 2014-11-19: 2 via ORAL
  Filled 2014-11-19: qty 2

## 2014-11-19 NOTE — Progress Notes (Signed)
Patient ID: Taylor Elliott, male   DOB: [birth year], 64 y.o.   MRN: [id number]   Pt doing well today.  Path: pT1b Fuhrman grade III clear cell RCC with negative margins  I reviewed results with him and his wife.   SL IVF Plan for discharge tomorrow am.

## 2014-11-19 NOTE — Progress Notes (Signed)
Patient ID: RAYON MCCHRISTIAN, male   DOB: 1951-04-11, 64 y.o.   MRN: [id number]  1 Day Post-Op Subjective: No complaints.  Denies nausea or vomiting.  Tolerating regular diet.  Ambulating well.  Pain controlled. Voiding well.  Objective: Vital signs in last 24 hours: Temp:  [97.6 F (36.4 C)-97.9 F (36.6 C)] 97.9 F (36.6 C) (04/22 1436) Pulse Rate:  [74-88] 78 (04/22 1436) Resp:  [18-20] 18 (04/22 1436) BP: (116-150)/(65-80) 127/73 mmHg (04/22 1436) SpO2:  [93 %-99 %] 94 % (04/22 1436)  Intake/Output from previous day: 04/21 0701 - 04/22 0700 In: 5642.5 [P.O.:240; I.V.:5052.5; IV Piggyback:350] Out: 1499 [Urine:3525; Blood:100] Intake/Output this shift: Total I/O In: 240 [P.O.:240] Out: 625 [Urine:625]  Physical Exam:  General: Alert and oriented CV: RRR Lungs: Clear Abdomen: Soft, ND, Positive BS Incisions: C/D/I Ext: NT, No erythema  Lab Results:  Recent Labs  11/18/14 1055 11/19/14 0552  HGB 15.2 13.0  HCT 45.2 38.9*   BMET  Recent Labs  11/18/14 1055 11/19/14 0552  NA 137 135  K 4.6 4.6  CL 103 102  CO2 27 28  GLUCOSE 170* 166*  BUN 12 11  CREATININE 1.06 1.26  CALCIUM 8.6 8.7     Studies/Results:  Assessment/Plan: POD #1 s/p right laparoscopic radical nephrectomy  - Ambulate, IS - Regular diet - DVT prophylaxis - Po pain medication - Medlock    LOS: 1 day   Kameran Lallier C 11/19/2014, 4:36 PM

## 2014-11-19 NOTE — Progress Notes (Signed)
Patient ID: Taylor Elliott, male   DOB: [birth year], 64 y.o.   MRN: [id number]  1 Day Post-Op Subjective: No complaints.  Denies nausea or vomiting.  Tolerating clears.  Ambulating well.  Pain controlled.  Objective: Vital signs in last 24 hours: Temp:  [97.4 F (36.3 C)-97.8 F (36.6 C)] 97.6 F (36.4 C) (04/22 0615) Pulse Rate:  [74-88] 77 (04/22 0615) Resp:  [11-20] 18 (04/22 0615) BP: (113-150)/(55-80) 116/65 mmHg (04/22 0615) SpO2:  [91 %-100 %] 94 % (04/22 0615)  Intake/Output from previous day: 04/21 0701 - 04/22 0700 In: 5642.5 [P.O.:240; I.V.:5052.5; IV Piggyback:350] Out: 3625 [Urine:3525; Blood:100] Intake/Output this shift:    Physical Exam:  General: Alert and oriented CV: RRR Lungs: Clear Abdomen: Soft, ND, Positive BS Incisions: C/D/I Ext: NT, No erythema  Lab Results:  Recent Labs  11/18/14 1055 11/19/14 0552  HGB 15.2 13.0  HCT 45.2 38.9*   BMET  Recent Labs  11/18/14 1055 11/19/14 0552  NA 137 135  K 4.6 4.6  CL 103 102  CO2 27 28  GLUCOSE 170* 166*  BUN 12 11  CREATININE 1.06 1.26  CALCIUM 8.6 8.7     Studies/Results:  Assessment/Plan: POD #1 s/p right laparoscopic radical nephrectomy  - Ambulate, IS - D/C catheter - Advance diet - DVT prophylaxis - Po pain medication - Decrease IVF   LOS: 1 day   Taylor Elliott,LES 11/19/2014, 7:49 AM

## 2014-11-20 LAB — BASIC METABOLIC PANEL
Anion gap: 6 (ref 5–15)
BUN: 14 mg/dL (ref 6–23)
CO2: 30 mmol/L (ref 19–32)
Calcium: 9.2 mg/dL (ref 8.4–10.5)
Chloride: 104 mmol/L (ref 96–112)
Creatinine, Ser: 1.45 mg/dL — ABNORMAL HIGH (ref 0.50–1.35)
GFR calc Af Amer: 58 mL/min — ABNORMAL LOW (ref >90)
GFR calc non Af Amer: 50 mL/min — ABNORMAL LOW (ref >90)
Glucose, Bld: 107 mg/dL — ABNORMAL HIGH (ref 70–99)
Potassium: 5 mmol/L (ref 3.5–5.1)
Sodium: 140 mmol/L (ref 135–145)

## 2014-11-20 MED ORDER — CEPHALEXIN 500 MG PO CAPS: 500.0000 mg | ORAL_CAPSULE | Freq: Four times a day (QID) | ORAL | Status: DC

## 2014-11-20 MED ORDER — HYDROCODONE-ACETAMINOPHEN 5-325 MG PO TABS
1.0000 | ORAL_TABLET | ORAL | Status: DC | PRN
  Administered 2014-11-20: 2 via ORAL
  Filled 2014-11-20: qty 2

## 2014-11-20 MED ORDER — HYDROCODONE-ACETAMINOPHEN 5-325 MG PO TABS: 1.0000 | ORAL_TABLET | ORAL | Status: DC | PRN

## 2014-11-20 NOTE — Discharge Summary (Signed)
Physician Discharge Summary   Patient ID: Taylor Elliott [id number] 64 y.o. [birth year]  Admit date: 11/18/2014  Discharge date and time: 11/20/2014  Admitting Physician: Raynelle Bring, MD   Discharge Physician: Dr. Franchot Gallo  Admission Diagnoses: RIGHT RENAL MASS  Discharge Diagnoses: Right renal cell carcinoma  Admission Condition: good  Discharged Condition: good  Indication for Admission: Right laparoscopic nephrectomy  Hospital Course: The patient underwent laparoscopic right radical nephrectomy on 11/18/2014. They tolerated the procedure well and were transferred to the floor after routine recovery in PACU. Their diet was slowly advanced until they were able to tolerate regular food. They were able to ambulate and void after foley removal without difficulty. Pain was controlled with PO pain pills. They were suitable for discharge on POD#2. He had some redness around his midline extraction incision so he was sent home with Keflex for 10 days.  Consults: None  Discharge Exam: normal WOB. Incisions C/D/I with mild redness around midline incision. Abdomen soft, NT/ND. Extremities WWP, no edema   Disposition: 01-Home or Self Care  Patient Instructions:    Medication List    STOP taking these medications        aspirin EC 81 MG tablet     ciprofloxacin 500 MG tablet  Commonly known as:  CIPRO     multivitamin with minerals Tabs tablet     naproxen sodium 220 MG tablet  Commonly known as:  ANAPROX      TAKE these medications        allopurinol 100 MG tablet  Commonly known as:  ZYLOPRIM  Take 200 mg by mouth every morning.     amLODipine 10 MG tablet  Commonly known as:  NORVASC  Take 10 mg by mouth every morning.     calcium carbonate 500 MG chewable tablet  Commonly known as:  TUMS - dosed in mg elemental calcium  Chew 1 tablet by mouth daily. As needed     cephALEXin 500 MG capsule  Commonly known as:  KEFLEX  Take 1 capsule (500 mg total) by  mouth 4 (four) times daily.     colchicine 0.6 MG tablet  Take 0.6 mg by mouth daily. As needed     docusate sodium 100 MG capsule  Commonly known as:  COLACE  Take 1 capsule (100 mg total) by mouth 2 (two) times daily.     EPIPEN IJ  Inject as directed.     HYDROcodone-acetaminophen 5-325 MG per tablet  Commonly known as:  NORCO/VICODIN  Take 1 tablet by mouth every 6 (six) hours as needed for moderate pain.     HYDROcodone-acetaminophen 5-325 MG per tablet  Commonly known as:  NORCO  Take 1-2 tablets by mouth every 4 (four) hours as needed for moderate pain.     lisinopril 20 MG tablet  Commonly known as:  PRINIVIL,ZESTRIL  Take 20 mg by mouth every morning.     ranitidine 150 MG tablet  Commonly known as:  ZANTAC  Take 150 mg by mouth daily. As needed     rosuvastatin 10 MG tablet  Commonly known as:  CRESTOR  Take 10 mg by mouth every morning.     solifenacin 10 MG tablet  Commonly known as:  VESICARE  Take 10 mg by mouth daily.       Activity: no heavy lifting for 6 weeks Diet: regular diet Wound Care: keep wound clean and dry  Follow-up with Dr. Alinda Money as scheduled. Call if redness worsens.   Signed:  Wynetta Emery, DAVID C 11/20/2014 8:54 AM

## 2014-12-14 ENCOUNTER — Other Ambulatory Visit (HOSPITAL_COMMUNITY): Payer: Self-pay | Admitting: Urology

## 2014-12-14 DIAGNOSIS — K6389 Other specified diseases of intestine: Secondary | ICD-10-CM

## 2014-12-22 ENCOUNTER — Ambulatory Visit (HOSPITAL_COMMUNITY)
Admission: RE | Admit: 2014-12-22 | Discharge: 2014-12-22 | Disposition: A | Discharge status: Home / Self Care | Payer: 59 | Source: Ambulatory Visit | Attending: Urology | Admitting: Urology

## 2014-12-22 DIAGNOSIS — Z905 Acquired absence of kidney: Secondary | ICD-10-CM | POA: Insufficient documentation

## 2014-12-22 DIAGNOSIS — C61 Malignant neoplasm of prostate: Secondary | ICD-10-CM | POA: Diagnosis present

## 2014-12-22 DIAGNOSIS — K760 Fatty (change of) liver, not elsewhere classified: Secondary | ICD-10-CM | POA: Diagnosis not present

## 2014-12-22 DIAGNOSIS — K7689 Other specified diseases of liver: Secondary | ICD-10-CM | POA: Insufficient documentation

## 2014-12-22 DIAGNOSIS — K6389 Other specified diseases of intestine: Secondary | ICD-10-CM

## 2014-12-22 DIAGNOSIS — C649 Malignant neoplasm of unspecified kidney, except renal pelvis: Secondary | ICD-10-CM | POA: Diagnosis present

## 2014-12-22 MED ORDER — GADOBENATE DIMEGLUMINE 529 MG/ML IV SOLN
20.0000 mL | Freq: Once | INTRAVENOUS | Status: AC | PRN
  Administered 2014-12-22: 17 mL via INTRAVENOUS

## 2015-01-20 ENCOUNTER — Other Ambulatory Visit (HOSPITAL_COMMUNITY): Payer: Self-pay | Admitting: Neurological Surgery

## 2015-01-20 DIAGNOSIS — R2689 Other abnormalities of gait and mobility: Secondary | ICD-10-CM

## 2015-01-27 ENCOUNTER — Ambulatory Visit (HOSPITAL_COMMUNITY): Payer: 59

## 2015-02-01 ENCOUNTER — Encounter: Payer: Self-pay | Admitting: *Deleted

## 2015-02-01 ENCOUNTER — Encounter: Payer: Self-pay | Admitting: Cardiology

## 2015-02-02 ENCOUNTER — Encounter: Payer: Self-pay | Admitting: Cardiology

## 2015-02-07 ENCOUNTER — Ambulatory Visit (HOSPITAL_COMMUNITY)
Admission: RE | Admit: 2015-02-07 | Discharge: 2015-02-07 | Disposition: A | Discharge status: Home / Self Care | Payer: 59 | Source: Ambulatory Visit | Attending: Neurological Surgery | Admitting: Neurological Surgery

## 2015-02-07 DIAGNOSIS — C61 Malignant neoplasm of prostate: Secondary | ICD-10-CM | POA: Diagnosis not present

## 2015-02-07 DIAGNOSIS — R2689 Other abnormalities of gait and mobility: Secondary | ICD-10-CM | POA: Insufficient documentation

## 2015-02-07 DIAGNOSIS — C649 Malignant neoplasm of unspecified kidney, except renal pelvis: Secondary | ICD-10-CM | POA: Diagnosis not present

## 2015-02-07 LAB — POCT I-STAT CREATININE: Creatinine, Ser: 1.4 mg/dL — ABNORMAL HIGH (ref 0.61–1.24)

## 2015-02-07 MED ORDER — GADOBENATE DIMEGLUMINE 529 MG/ML IV SOLN
20.0000 mL | Freq: Once | INTRAVENOUS | Status: AC | PRN
  Administered 2015-02-07: 17 mL via INTRAVENOUS

## 2015-03-22 ENCOUNTER — Other Ambulatory Visit (HOSPITAL_COMMUNITY): Payer: Self-pay | Admitting: Urology

## 2015-03-22 ENCOUNTER — Ambulatory Visit (HOSPITAL_COMMUNITY)
Admission: RE | Admit: 2015-03-22 | Discharge: 2015-03-22 | Disposition: A | Discharge status: Home / Self Care | Payer: 59 | Source: Ambulatory Visit | Attending: Urology | Admitting: Urology

## 2015-03-22 DIAGNOSIS — J439 Emphysema, unspecified: Secondary | ICD-10-CM | POA: Insufficient documentation

## 2015-03-22 DIAGNOSIS — I10 Essential (primary) hypertension: Secondary | ICD-10-CM | POA: Insufficient documentation

## 2015-03-22 DIAGNOSIS — R918 Other nonspecific abnormal finding of lung field: Secondary | ICD-10-CM | POA: Insufficient documentation

## 2015-03-22 DIAGNOSIS — C641 Malignant neoplasm of right kidney, except renal pelvis: Secondary | ICD-10-CM

## 2015-03-22 DIAGNOSIS — Z8546 Personal history of malignant neoplasm of prostate: Secondary | ICD-10-CM | POA: Diagnosis not present

## 2015-04-25 ENCOUNTER — Telehealth: Payer: Self-pay | Admitting: Hematology & Oncology

## 2015-04-25 NOTE — Telephone Encounter (Signed)
I called and spoke with patient and gave New patient apt with date and time along with instructions to bring insurance card and medications

## 2015-05-12 ENCOUNTER — Other Ambulatory Visit: Payer: 59

## 2015-05-12 ENCOUNTER — Ambulatory Visit: Payer: 59

## 2015-05-12 ENCOUNTER — Ambulatory Visit: Payer: 59 | Admitting: Hematology & Oncology

## 2015-05-31 ENCOUNTER — Ambulatory Visit: Payer: 59

## 2015-05-31 ENCOUNTER — Other Ambulatory Visit: Payer: 59

## 2015-05-31 ENCOUNTER — Ambulatory Visit (HOSPITAL_BASED_OUTPATIENT_CLINIC_OR_DEPARTMENT_OTHER): Payer: 59 | Admitting: Hematology & Oncology

## 2015-05-31 ENCOUNTER — Encounter: Payer: Self-pay | Admitting: Hematology & Oncology

## 2015-05-31 VITALS — BP 168/77 | HR 94 | Temp 97.9°F | Resp 18 | Ht 68.0 in | Wt 197.0 lb

## 2015-05-31 DIAGNOSIS — C9629 Other malignant mast cell neoplasm: Secondary | ICD-10-CM

## 2015-05-31 DIAGNOSIS — C962 Malignant mast cell neoplasm, unspecified: Secondary | ICD-10-CM

## 2015-05-31 DIAGNOSIS — D4709 Other mast cell neoplasms of uncertain behavior: Secondary | ICD-10-CM

## 2015-05-31 HISTORY — DX: Other mast cell neoplasms of uncertain behavior: D47.09

## 2015-05-31 LAB — IRON AND TIBC CHCC
%SAT: 25 % (ref 20–55)
Iron: 88 ug/dL (ref 42–163)
TIBC: 349 ug/dL (ref 202–409)
UIBC: 261 ug/dL (ref 117–376)

## 2015-05-31 LAB — CBC WITH DIFFERENTIAL (CANCER CENTER ONLY)
BASO#: 0 10*3/uL (ref 0.0–0.2)
BASO%: 0.5 % (ref 0.0–2.0)
EOS%: 1.4 % (ref 0.0–7.0)
Eosinophils Absolute: 0.1 10*3/uL (ref 0.0–0.5)
HCT: 39.7 % (ref 38.7–49.9)
HGB: 13.3 g/dL (ref 13.0–17.1)
LYMPH#: 0.7 10*3/uL — ABNORMAL LOW (ref 0.9–3.3)
LYMPH%: 12.8 % — ABNORMAL LOW (ref 14.0–48.0)
MCH: 30.6 pg (ref 28.0–33.4)
MCHC: 33.5 g/dL (ref 32.0–35.9)
MCV: 92 fL (ref 82–98)
MONO#: 0.5 10*3/uL (ref 0.1–0.9)
MONO%: 8.8 % (ref 0.0–13.0)
NEUT#: 4.3 10*3/uL (ref 1.5–6.5)
NEUT%: 76.5 % (ref 40.0–80.0)
Platelets: 189 10*3/uL (ref 145–400)
RBC: 4.34 10*6/uL (ref 4.20–5.70)
RDW: 13.1 % (ref 11.1–15.7)
WBC: 5.6 10*3/uL (ref 4.0–10.0)

## 2015-05-31 LAB — COMPREHENSIVE METABOLIC PANEL (CC13)
ALT: 27 U/L (ref 0–55)
AST: 19 U/L (ref 5–34)
Albumin: 4.2 g/dL (ref 3.5–5.0)
Alkaline Phosphatase: 116 U/L (ref 40–150)
Anion Gap: 9 mEq/L (ref 3–11)
BUN: 18.2 mg/dL (ref 7.0–26.0)
CO2: 26 mEq/L (ref 22–29)
Calcium: 10.2 mg/dL (ref 8.4–10.4)
Chloride: 104 mEq/L (ref 98–109)
Creatinine: 1.4 mg/dL — ABNORMAL HIGH (ref 0.7–1.3)
EGFR: 53 mL/min/{1.73_m2} — ABNORMAL LOW (ref >90)
Glucose: 111 mg/dl (ref 70–140)
Potassium: 4.8 mEq/L (ref 3.5–5.1)
Sodium: 139 mEq/L (ref 136–145)
Total Bilirubin: 0.4 mg/dL (ref 0.20–1.20)
Total Protein: 7.4 g/dL (ref 6.4–8.3)

## 2015-05-31 LAB — FERRITIN CHCC: Ferritin: 77 ng/ml (ref 22–316)

## 2015-05-31 LAB — CHCC SATELLITE - SMEAR
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
## 2015-06-02 LAB — LACTATE DEHYDROGENASE: LDH: 130 U/L (ref 94–250)

## 2015-06-02 LAB — RETICULOCYTES (CHCC)
ABS Retic: 52.9 10*3/uL (ref 19.0–186.0)
RBC.: 4.41 MIL/uL (ref 4.22–5.81)
Retic Ct Pct: 1.2 % (ref 0.4–2.3)

## 2015-06-02 LAB — TRYPTASE: Tryptase: 34.5 ug/L — ABNORMAL HIGH (ref <11)

## 2015-06-03 LAB — ERYTHROPOIETIN: Erythropoietin: 11.6 m[IU]/mL (ref 2.6–18.5)

## 2015-06-10 ENCOUNTER — Other Ambulatory Visit (HOSPITAL_COMMUNITY): Payer: Self-pay | Admitting: Hematology & Oncology

## 2015-06-13 ENCOUNTER — Other Ambulatory Visit: Payer: Self-pay | Admitting: Hematology & Oncology

## 2015-06-13 DIAGNOSIS — D4709 Other mast cell neoplasms of uncertain behavior: Secondary | ICD-10-CM

## 2015-06-14 ENCOUNTER — Ambulatory Visit (HOSPITAL_COMMUNITY)
Admission: RE | Admit: 2015-06-14 | Discharge: 2015-06-14 | Disposition: A | Discharge status: Home / Self Care | Payer: 59 | Source: Ambulatory Visit | Attending: Hematology & Oncology | Admitting: Hematology & Oncology

## 2015-06-14 ENCOUNTER — Encounter (HOSPITAL_COMMUNITY): Payer: Self-pay

## 2015-06-14 VITALS — BP 108/63 | HR 74 | Temp 97.5°F | Resp 18 | Ht 68.0 in | Wt 190.0 lb

## 2015-06-14 DIAGNOSIS — D7581 Myelofibrosis: Secondary | ICD-10-CM | POA: Insufficient documentation

## 2015-06-14 DIAGNOSIS — C962 Malignant mast cell tumor: Secondary | ICD-10-CM

## 2015-06-14 DIAGNOSIS — Q822 Mastocytosis: Secondary | ICD-10-CM | POA: Diagnosis present

## 2015-06-14 DIAGNOSIS — D4709 Other mast cell neoplasms of uncertain behavior: Secondary | ICD-10-CM

## 2015-06-14 LAB — CBC WITH DIFFERENTIAL/PLATELET
Basophils Absolute: 0 10*3/uL (ref 0.0–0.1)
Basophils Relative: 1 %
Eosinophils Absolute: 0.2 10*3/uL (ref 0.0–0.7)
Eosinophils Relative: 3 %
HCT: 37.8 % — ABNORMAL LOW (ref 39.0–52.0)
Hemoglobin: 12.7 g/dL — ABNORMAL LOW (ref 13.0–17.0)
Lymphocytes Relative: 16 %
Lymphs Abs: 0.7 10*3/uL (ref 0.7–4.0)
MCH: 30.8 pg (ref 26.0–34.0)
MCHC: 33.6 g/dL (ref 30.0–36.0)
MCV: 91.7 fL (ref 78.0–100.0)
Monocytes Absolute: 0.4 10*3/uL (ref 0.1–1.0)
Monocytes Relative: 9 %
Neutro Abs: 3.2 10*3/uL (ref 1.7–7.7)
Neutrophils Relative %: 71 %
Platelets: 201 10*3/uL (ref 150–400)
RBC: 4.12 MIL/uL — ABNORMAL LOW (ref 4.22–5.81)
RDW: 13.7 % (ref 11.5–15.5)
WBC: 4.4 10*3/uL (ref 4.0–10.5)

## 2015-06-14 LAB — BONE MARROW EXAM

## 2015-06-14 MED ORDER — MIDAZOLAM HCL 5 MG/5ML IJ SOLN
INTRAMUSCULAR | Status: AC | PRN
  Administered 2015-06-14: 5 mg via INTRAVENOUS

## 2015-06-14 MED ORDER — MIDAZOLAM HCL 10 MG/2ML IJ SOLN: 10.0000 mg | Freq: Once | INTRAMUSCULAR | Status: DC

## 2015-06-14 MED ORDER — MIDAZOLAM HCL 5 MG/ML IJ SOLN
INTRAMUSCULAR | Status: AC
  Filled 2015-06-14: qty 2

## 2015-06-14 MED ORDER — SODIUM CHLORIDE 0.9 % IV SOLN
Freq: Once | INTRAVENOUS | Status: AC
  Administered 2015-06-14: 08:00:00 via INTRAVENOUS

## 2015-06-14 MED ORDER — MEPERIDINE HCL 50 MG/ML IJ SOLN
50.0000 mg | Freq: Once | INTRAMUSCULAR | Status: DC
  Filled 2015-06-14: qty 1

## 2015-06-14 MED ORDER — MEPERIDINE HCL 25 MG/ML IJ SOLN
INTRAMUSCULAR | Status: AC | PRN
  Administered 2015-06-14 (×2): 25 mg via INTRAVENOUS

## 2015-06-14 NOTE — Discharge Instructions (Signed)
Bone Marrow Aspiration and Bone Marrow Biopsy, Care After Refer to this sheet in the next few weeks. These instructions provide you with information about caring for yourself after your procedure. Your health care provider may also give you more specific instructions. Your treatment has been planned according to current medical practices, but problems sometimes occur. Call your health care provider if you have any problems or questions after your procedure. WHAT TO EXPECT AFTER THE PROCEDURE After your procedure, it is common to have:  Soreness or tenderness around the puncture site.  Bruising. HOME CARE INSTRUCTIONS  Take medicines only as directed by your health care provider.  Follow your health care provider's instructions about:  Puncture site care.  Bandage (dressing) changes and removal.  Bathe and shower as directed by your health care provider.  Check your puncture site every day for signs of infection. Watch for:  Redness, swelling, or pain.  Fluid, blood, or pus.  Return to your normal activities as directed by your health care provider.  Keep all follow-up visits as directed by your health care provider. This is important. SEEK MEDICAL CARE IF:  You have a fever.  You have uncontrollable bleeding.  You have redness, swelling, or pain at the site of your puncture.  You have fluid, blood, or pus coming from your puncture site.   This information is not intended to replace advice given to you by your health care provider. Make sure you discuss any questions you have with your health care provider.   Document Released: 02/02/2005 Document Revised: 11/30/2014 Document Reviewed: 07/07/2014 Elsevier Interactive Patient Education 2016 Else[employer]. Moderate Conscious Sedation, Adult, Care After Refer to this sheet in the next few weeks. These instructions provide you with information on caring for yourself after your procedure. Your health care provider may also give  you more specific instructions. Your treatment has been planned according to current medical practices, but problems sometimes occur. Call your health care provider if you have any problems or questions after your procedure. WHAT TO EXPECT AFTER THE PROCEDURE  After your procedure:  You may feel sleepy, clumsy, and have poor balance for several hours.  Vomiting may occur if you eat too soon after the procedure. HOME CARE INSTRUCTIONS  Do not participate in any activities where you could become injured for at least 24 hours. Do not:  Drive.  Swim.  Ride a bicycle.  Operate heavy machinery.  Cook.  Use power tools.  Climb ladders.  Work from a high place.  Do not make important decisions or sign legal documents until you are improved.  If you vomit, drink water, juice, or soup when you can drink without vomiting. Make sure you have little or no nausea before eating solid foods.  Only take over-the-counter or prescription medicines for pain, discomfort, or fever as directed by your health care provider.  Make sure you and your family fully understand everything about the medicines given to you, including what side effects may occur.  You should not drink alcohol, take sleeping pills, or take medicines that cause drowsiness for at least 24 hours.  If you smoke, do not smoke without supervision.  If you are feeling better, you may resume normal activities 24 hours after you were sedated.  Keep all appointments with your health care provider. SEEK MEDICAL CARE IF:  Your skin is pale or bluish in color.  You continue to feel nauseous or vomit.  Your pain is getting worse and is not helped by medicine.  You have bleeding or swelling.  You are still sleepy or feeling clumsy after 24 hours. SEEK IMMEDIATE MEDICAL CARE IF:  You develop a rash.  You have difficulty breathing.  You develop any type of allergic problem.  You have a fever. MAKE SURE YOU:  Understand  these instructions.  Will watch your condition.  Will get help right away if you are not doing well or get worse.   This information is not intended to replace advice given to you by your health care provider. Make sure you discuss any questions you have with your health care provider.   Document Released: 05/06/2013 Document Revised: 08/06/2014 Document Reviewed: 05/06/2013 Elsevier Interactive Patient Education [employer].

## 2015-06-14 NOTE — Procedures (Signed)
Mr. Metzgar was brought to the short stay unit at [facility] for a bone marrow biopsy and aspirate. This was done to try to help in the evaluation for possible mastocytosis.  He had an IV placed peripherally without difficulty.  We did the appropriate timeout procedure at 7:40 AM area did  His Mallimpati score is 1. His ASA class is 1.  He was then placed onto his right side. He received a total of 5 mg of Versed and 50 mg of Demerol for IV sedation.  The left posterior iliac crest region was prepped date in sterile fashion. 5 mL of 1% lidocaine was admitted under the skin down to the periosteum.  We used a scalpel to make an incision into the skin.  With a dedicated bone marrow aspirate needle, we obtained to bone marrow aspirates.  With the biopsy needle, we got an excellent bone marrow biopsy core.  He tolerated the procedure well. There were no complications.  We cleaned and dressed the procedure site sterilely.  I got his wife and talk to her after the procedure.  Lum Keas

## 2015-06-15 DIAGNOSIS — C649 Malignant neoplasm of unspecified kidney, except renal pelvis: Secondary | ICD-10-CM | POA: Insufficient documentation

## 2015-06-15 DIAGNOSIS — Z9889 Other specified postprocedural states: Secondary | ICD-10-CM | POA: Insufficient documentation

## 2015-06-22 LAB — CHROMOSOME ANALYSIS, BONE MARROW

## 2015-06-22 LAB — TISSUE HYBRIDIZATION (BONE MARROW)-NCBH

## 2015-06-27 DIAGNOSIS — N289 Disorder of kidney and ureter, unspecified: Secondary | ICD-10-CM | POA: Insufficient documentation

## 2015-06-27 NOTE — Addendum Note (Signed)
Addended by: Burney Gauze R on: 06/27/2015 01:00 PM   Modules accepted: Orders

## 2015-07-01 ENCOUNTER — Encounter (HOSPITAL_COMMUNITY): Payer: Self-pay

## 2015-07-05 ENCOUNTER — Ambulatory Visit (HOSPITAL_BASED_OUTPATIENT_CLINIC_OR_DEPARTMENT_OTHER)
Admission: RE | Admit: 2015-07-05 | Discharge: 2015-07-05 | Disposition: A | Discharge status: Home / Self Care | Payer: 59 | Source: Ambulatory Visit | Attending: Hematology & Oncology | Admitting: Hematology & Oncology

## 2015-07-05 DIAGNOSIS — M8580 Other specified disorders of bone density and structure, unspecified site: Secondary | ICD-10-CM | POA: Diagnosis not present

## 2015-07-05 DIAGNOSIS — D4709 Other mast cell neoplasms of uncertain behavior: Secondary | ICD-10-CM

## 2015-07-05 DIAGNOSIS — C962 Malignant mast cell neoplasm, unspecified: Secondary | ICD-10-CM

## 2015-07-05 DIAGNOSIS — Q822 Mastocytosis: Secondary | ICD-10-CM | POA: Insufficient documentation

## 2015-07-05 DIAGNOSIS — Z87891 Personal history of nicotine dependence: Secondary | ICD-10-CM | POA: Diagnosis not present

## 2015-07-05 DIAGNOSIS — C9629 Other malignant mast cell neoplasm: Secondary | ICD-10-CM

## 2015-07-06 ENCOUNTER — Other Ambulatory Visit: Payer: Self-pay | Admitting: Neurological Surgery

## 2015-07-07 ENCOUNTER — Encounter: Payer: Self-pay | Admitting: *Deleted

## 2015-07-12 ENCOUNTER — Ambulatory Visit (HOSPITAL_BASED_OUTPATIENT_CLINIC_OR_DEPARTMENT_OTHER): Payer: 59 | Admitting: Family

## 2015-07-12 ENCOUNTER — Encounter: Payer: Self-pay | Admitting: Family

## 2015-07-12 ENCOUNTER — Other Ambulatory Visit (HOSPITAL_BASED_OUTPATIENT_CLINIC_OR_DEPARTMENT_OTHER): Payer: 59

## 2015-07-12 VITALS — BP 160/78 | HR 90 | Temp 98.2°F | Resp 18 | Ht 68.0 in | Wt 199.0 lb

## 2015-07-12 DIAGNOSIS — Q822 Mastocytosis: Secondary | ICD-10-CM | POA: Diagnosis not present

## 2015-07-12 DIAGNOSIS — D4709 Other mast cell neoplasms of uncertain behavior: Secondary | ICD-10-CM

## 2015-07-12 DIAGNOSIS — C962 Malignant mast cell neoplasm, unspecified: Secondary | ICD-10-CM

## 2015-07-12 DIAGNOSIS — C9629 Other malignant mast cell neoplasm: Secondary | ICD-10-CM

## 2015-07-12 LAB — COMPREHENSIVE METABOLIC PANEL
ALT: 24 U/L (ref 0–55)
AST: 22 U/L (ref 5–34)
Albumin: 4 g/dL (ref 3.5–5.0)
Alkaline Phosphatase: 115 U/L (ref 40–150)
Anion Gap: 10 mEq/L (ref 3–11)
BUN: 15.7 mg/dL (ref 7.0–26.0)
CO2: 24 mEq/L (ref 22–29)
Calcium: 10.1 mg/dL (ref 8.4–10.4)
Chloride: 101 mEq/L (ref 98–109)
Creatinine: 1.6 mg/dL — ABNORMAL HIGH (ref 0.7–1.3)
EGFR: 45 mL/min/{1.73_m2} — ABNORMAL LOW (ref >90)
Glucose: 114 mg/dl (ref 70–140)
Potassium: 4.5 mEq/L (ref 3.5–5.1)
Sodium: 135 mEq/L — ABNORMAL LOW (ref 136–145)
Total Bilirubin: 0.46 mg/dL (ref 0.20–1.20)
Total Protein: 7.6 g/dL (ref 6.4–8.3)

## 2015-07-12 LAB — CBC WITH DIFFERENTIAL (CANCER CENTER ONLY)
BASO#: 0 10*3/uL (ref 0.0–0.2)
BASO%: 0.8 % (ref 0.0–2.0)
EOS%: 3.5 % (ref 0.0–7.0)
Eosinophils Absolute: 0.2 10*3/uL (ref 0.0–0.5)
HCT: 38.6 % — ABNORMAL LOW (ref 38.7–49.9)
HGB: 12.9 g/dL — ABNORMAL LOW (ref 13.0–17.1)
LYMPH#: 0.8 10*3/uL — ABNORMAL LOW (ref 0.9–3.3)
LYMPH%: 15 % (ref 14.0–48.0)
MCH: 30.5 pg (ref 28.0–33.4)
MCHC: 33.4 g/dL (ref 32.0–35.9)
MCV: 91 fL (ref 82–98)
MONO#: 0.6 10*3/uL (ref 0.1–0.9)
MONO%: 10.7 % (ref 0.0–13.0)
NEUT#: 3.6 10*3/uL (ref 1.5–6.5)
NEUT%: 70 % (ref 40.0–80.0)
Platelets: 211 10*3/uL (ref 145–400)
RBC: 4.23 10*6/uL (ref 4.20–5.70)
RDW: 14 % (ref 11.1–15.7)
WBC: 5.1 10*3/uL (ref 4.0–10.0)

## 2015-07-12 LAB — LACTATE DEHYDROGENASE: LDH: 147 U/L (ref 125–245)

## 2015-07-12 NOTE — Progress Notes (Signed)
Hematology and Oncology Follow Up Visit  Taylor Elliott [id number] 1950-08-03 64 y.o. 07/12/2015   Principle Diagnosis:  Systemic mastocytosis  History of prostate cancer stage III ([id number]) History of renal cell carcinoma - right Kidney removed in April 2016  Current Therapy:   Observation    Interim History:  Taylor Elliott is here today for follow-up. His bone marrow biopsy in November was positive for systemic mastocytosis.   He continues to get venom injections once a month with his allergist. He has done well with this an now only has some mild itching afterwards.   He has cut back on his Dexilant and only takes as needed. He is very careful about medications that may have an adverse effect on kidney function. His recent bone scan showed osteopenia in the left femur neck. He states that he has researched the different bone strengthening agents and prefers to hold off for now.  No fever, chills, n/v, cough, rash, dizziness, SOB, chest pain, palpitations, abdominal pain or constipation. He has had several episodes of diarrhea that resolved with Imodium.  No changes in bladder function. He has a healthy appetite and is staying well hydrated. His weight is up 9 lbs since his last visit.  He will be having back surgery on January 3rd with implants placed to help decompress areas of the L spine. Hopefully this will help his back pain and right leg numbness.  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 This was removed when he was a child.     Medications:    Medication List       This list is accurate as of: 07/12/15 10:26 AM.  Always use your most recent med list.               acetaminophen 500 MG tablet  Commonly known as:  TYLENOL  Take 500 mg by mouth every 6 (six) hours as needed.     allopurinol 100 MG tablet  Commonly known as:  ZYLOPRIM  Take 200 mg by mouth every morning.     amLODipine 10 MG tablet  Commonly known as:  NORVASC  Take 10  mg by mouth every morning.     calcium carbonate 500 MG chewable tablet  Commonly known as:  TUMS - dosed in mg elemental calcium  Chew 1 tablet by mouth daily. As needed     colchicine 0.6 MG tablet  Take 0.6 mg by mouth daily. As needed     DEXILANT 60 MG capsule  Generic drug:  dexlansoprazole  Take 60 mg by mouth.     EPIPEN IJ  Inject as directed.     HYDROcodone-acetaminophen 5-325 MG tablet  Commonly known as:  NORCO  Take 1-2 tablets by mouth every 4 (four) hours as needed for moderate pain.     lisinopril 20 MG tablet  Commonly known as:  PRINIVIL,ZESTRIL  Take 20 mg by mouth every morning.     loperamide 2 MG capsule  Commonly known as:  IMODIUM  Take 2 mg by mouth as needed for diarrhea or loose stools.     rosuvastatin 10 MG tablet  Commonly known as:  CRESTOR  Take 10 mg by mouth every morning.     solifenacin 10 MG tablet  Commonly known as:  VESICARE  Take 10 mg by mouth daily.        Allergies:  Allergies  Allergen Reactions  . Bee Venom Anaphylaxis  Past Medical History, Surgical history, Social history, and Family History were reviewed and updated.  Review of Systems: All other 10 point review of systems is negative.   Physical Exam:  height is $RemoveB'5\' 8"'naYxDGDt$  (1.727 m) and weight is 199 lb (90.266 kg). His oral temperature is 98.2 F (36.8 C). His blood pressure is 160/78 and his pulse is 90. His respiration is 18.   Wt Readings from Last 3 Encounters:  07/12/15 199 lb (90.266 kg)  06/14/15 190 lb (86.183 kg)  05/31/15 197 lb (89.359 kg)    Ocular: Sclerae unicteric, pupils equal, round and reactive to light Ear-nose-throat: Oropharynx clear, dentition fair Lymphatic: No cervical supraclavicular or axillary adenopathy Lungs no rales or rhonchi, good excursion bilaterally Heart regular rate and rhythm, no murmur appreciated Abd soft, nontender, positive bowel sounds MSK no focal spinal tenderness, no joint edema Neuro: non-focal,  well-oriented, appropriate affect Breasts: Deferred  Lab Results  Component Value Date   WBC 5.1 07/12/2015   HGB 12.9* 07/12/2015   HCT 38.6* 07/12/2015   MCV 91 07/12/2015   PLT 211 07/12/2015   Lab Results  Component Value Date   FERRITIN 77 05/31/2015   IRON 88 05/31/2015   TIBC 349 05/31/2015   UIBC 261 05/31/2015   IRONPCTSAT 25 05/31/2015   Lab Results  Component Value Date   RETICCTPCT 1.2 05/31/2015   RBC 4.23 07/12/2015   RETICCTABS 52.9 05/31/2015   No results found for: KPAFRELGTCHN, LAMBDASER, KAPLAMBRATIO No results found for: IGGSERUM, IGA, IGMSERUM No results found for: Odetta Pink, SPEI   Chemistry      Component Value Date/Time   NA 139 05/31/2015 1040   NA 140 11/20/2014 0435   K 4.8 05/31/2015 1040   K 5.0 11/20/2014 0435   CL 104 11/20/2014 0435   CO2 26 05/31/2015 1040   CO2 30 11/20/2014 0435   BUN 18.2 05/31/2015 1040   BUN 14 11/20/2014 0435   CREATININE 1.4* 05/31/2015 1040   CREATININE 1.40* 02/07/2015 1733      Component Value Date/Time   CALCIUM 10.2 05/31/2015 1040   CALCIUM 9.2 11/20/2014 0435   ALKPHOS 116 05/31/2015 1040   ALKPHOS 90 04/23/2011 0904   AST 19 05/31/2015 1040   AST 19 04/23/2011 0904   ALT 27 05/31/2015 1040   ALT 30 04/23/2011 0904   BILITOT 0.40 05/31/2015 1040   BILITOT 0.3 04/23/2011 0904     Impression and Plan: Taylor Elliott is a 64 yo gentleman with mastocytosis. He is currently getting bee venom injections monthly with his allergist. He now only has some mild itching and takes benadryl if needed. He carries an epi pen with him at all times. He has had mild diarrhea at times that resolves with imodium. CBC and CMP today look good. No anemia or abnormal LFT's. Tryptase level is pending.  He prefers to hold off on a bone strengthening again for his osteopenia at this time.  We will continue to follow along with him and plan to see him back in 2  months. He will contact us with any questions or concerns. We can certainly see him sooner if need be.   Eliezer Bottom, NP 12/13/201610:26 AM

## 2015-07-15 ENCOUNTER — Other Ambulatory Visit (HOSPITAL_COMMUNITY): Payer: Self-pay | Admitting: Otolaryngology

## 2015-07-15 DIAGNOSIS — J320 Chronic maxillary sinusitis: Secondary | ICD-10-CM

## 2015-07-21 ENCOUNTER — Ambulatory Visit (HOSPITAL_COMMUNITY)
Admission: RE | Admit: 2015-07-21 | Discharge: 2015-07-21 | Disposition: A | Discharge status: Home / Self Care | Payer: 59 | Source: Ambulatory Visit | Attending: Otolaryngology | Admitting: Otolaryngology

## 2015-07-21 DIAGNOSIS — J320 Chronic maxillary sinusitis: Secondary | ICD-10-CM | POA: Diagnosis not present

## 2015-07-21 DIAGNOSIS — J349 Unspecified disorder of nose and nasal sinuses: Secondary | ICD-10-CM | POA: Diagnosis not present

## 2015-07-22 ENCOUNTER — Encounter (HOSPITAL_COMMUNITY)
Admission: RE | Admit: 2015-07-22 | Discharge: 2015-07-22 | Disposition: A | Discharge status: Home / Self Care | Payer: 59 | Source: Ambulatory Visit | Attending: Neurological Surgery | Admitting: Neurological Surgery

## 2015-07-22 ENCOUNTER — Encounter (HOSPITAL_COMMUNITY): Payer: Self-pay

## 2015-07-22 DIAGNOSIS — M47816 Spondylosis without myelopathy or radiculopathy, lumbar region: Secondary | ICD-10-CM | POA: Diagnosis not present

## 2015-07-22 DIAGNOSIS — Z01812 Encounter for preprocedural laboratory examination: Secondary | ICD-10-CM | POA: Insufficient documentation

## 2015-07-22 HISTORY — DX: Chronic kidney disease, unspecified: N18.9

## 2015-07-22 HISTORY — DX: Chronic sinusitis, unspecified: J32.9

## 2015-07-22 LAB — BASIC METABOLIC PANEL
Anion gap: 7 (ref 5–15)
BUN: 12 mg/dL (ref 6–20)
CO2: 26 mmol/L (ref 22–32)
Calcium: 9.6 mg/dL (ref 8.9–10.3)
Chloride: 106 mmol/L (ref 101–111)
Creatinine, Ser: 1.44 mg/dL — ABNORMAL HIGH (ref 0.61–1.24)
GFR calc Af Amer: 58 mL/min — ABNORMAL LOW (ref >60)
GFR calc non Af Amer: 50 mL/min — ABNORMAL LOW (ref >60)
Glucose, Bld: 106 mg/dL — ABNORMAL HIGH (ref 65–99)
Potassium: 4.9 mmol/L (ref 3.5–5.1)
Sodium: 139 mmol/L (ref 135–145)

## 2015-07-22 LAB — CBC
HCT: 37.3 % — ABNORMAL LOW (ref 39.0–52.0)
Hemoglobin: 12 g/dL — ABNORMAL LOW (ref 13.0–17.0)
MCH: 29.9 pg (ref 26.0–34.0)
MCHC: 32.2 g/dL (ref 30.0–36.0)
MCV: 93 fL (ref 78.0–100.0)
Platelets: 175 10*3/uL (ref 150–400)
RBC: 4.01 MIL/uL — ABNORMAL LOW (ref 4.22–5.81)
RDW: 14.1 % (ref 11.5–15.5)
WBC: 4.9 10*3/uL (ref 4.0–10.5)

## 2015-07-22 LAB — SURGICAL PCR SCREEN
MRSA, PCR: NEGATIVE
Staphylococcus aureus: POSITIVE — AB

## 2015-07-22 NOTE — Progress Notes (Addendum)
Saw Dr. Ellyn Hack approx 5 yrs ago for a routine/baseline test - had chemical stress test which came out normal. Hasn't been back since. Denies any heart problems now. Had prostate & kidney cancers.  Prostate and right kidney removed. Had benign spinal tumor removed yrs ago, which left him with left sided weakness and foot drop. PCP is Dr. Coletta Memos  [place]  05/2015 Urologist is Dr. Alinda Money Oncologist is Dr.Ennever Ortho is Dr. Leilani Able Dr. Janace Hoard "alittle while ago"--Ct scan done to make sure he has no bone loss.  Started on antibiotics (he has to start them)

## 2015-07-22 NOTE — Pre-Procedure Instructions (Signed)
Taylor Elliott  07/22/2015      [facility] - [place], [place] - [address] [place] [address] Phone: [phone] Fax: [phone]    Your procedure is scheduled on Tuesday, January 3rd   Report to [facility] Admitting at [employer] this number if you have problems the morning of surgery:  [phone]   Remember:  Do not eat food or drink liquids after midnight Monday.   Take these medicines the morning of surgery with A SIP OF WATER : Norvasc, Dexilant, Pain Medication             STOP taking any herbal supplements, vitamins, and anti-inflammatories 4-5 days out   Do not wear jewelry - watches or rings  Do not wear lotions or colognes.  Do NOT wear deodorant the day of surgery.             Men may shave face and neck.   Do not bring valuables to the hospital.  [facility] is not responsible for any belongings or valuables.  Contacts, dentures or bridgework may not be worn into surgery.  Leave your suitcase in the car.  After surgery it may be brought to your room. For patients admitted to the hospital, discharge time will be determined by your treatment team.    Name and phone number of your driver:   [place]   KHVFMB   [phone]   Please read over the following fact sheets that you were given. Pain Booklet, Coughing and Deep Breathing, MRSA Information and Surgical Site Infection Prevention

## 2015-07-29 NOTE — Progress Notes (Signed)
Pt called to say that he had an allergic reaction to a medication and saw Dr. Precious Haws who put him on benadryl and zantac to help with the rash he has on his torso and arms.  He also received a shot of cortisone,  Instructed Pt to call Dr. Ellene Route to inform him.  Pt voiced understanding.  Meds added to medication reconcilliation list.

## 2015-07-30 ENCOUNTER — Encounter (HOSPITAL_BASED_OUTPATIENT_CLINIC_OR_DEPARTMENT_OTHER): Payer: Self-pay | Admitting: *Deleted

## 2015-07-30 ENCOUNTER — Emergency Department (HOSPITAL_BASED_OUTPATIENT_CLINIC_OR_DEPARTMENT_OTHER)
Admission: EM | Admit: 2015-07-30 | Discharge: 2015-07-30 | Disposition: A | Discharge status: Home / Self Care | Payer: 59 | Attending: Emergency Medicine | Admitting: Emergency Medicine

## 2015-07-30 DIAGNOSIS — N189 Chronic kidney disease, unspecified: Secondary | ICD-10-CM | POA: Insufficient documentation

## 2015-07-30 DIAGNOSIS — Z87891 Personal history of nicotine dependence: Secondary | ICD-10-CM | POA: Insufficient documentation

## 2015-07-30 DIAGNOSIS — Z85528 Personal history of other malignant neoplasm of kidney: Secondary | ICD-10-CM | POA: Diagnosis not present

## 2015-07-30 DIAGNOSIS — K219 Gastro-esophageal reflux disease without esophagitis: Secondary | ICD-10-CM | POA: Diagnosis not present

## 2015-07-30 DIAGNOSIS — Z8546 Personal history of malignant neoplasm of prostate: Secondary | ICD-10-CM | POA: Insufficient documentation

## 2015-07-30 DIAGNOSIS — R51 Headache: Secondary | ICD-10-CM | POA: Diagnosis present

## 2015-07-30 DIAGNOSIS — M1990 Unspecified osteoarthritis, unspecified site: Secondary | ICD-10-CM | POA: Diagnosis not present

## 2015-07-30 DIAGNOSIS — Z7982 Long term (current) use of aspirin: Secondary | ICD-10-CM | POA: Insufficient documentation

## 2015-07-30 DIAGNOSIS — Z8669 Personal history of other diseases of the nervous system and sense organs: Secondary | ICD-10-CM | POA: Diagnosis not present

## 2015-07-30 DIAGNOSIS — Z79899 Other long term (current) drug therapy: Secondary | ICD-10-CM | POA: Diagnosis not present

## 2015-07-30 DIAGNOSIS — Z87828 Personal history of other (healed) physical injury and trauma: Secondary | ICD-10-CM | POA: Insufficient documentation

## 2015-07-30 DIAGNOSIS — Z8709 Personal history of other diseases of the respiratory system: Secondary | ICD-10-CM | POA: Diagnosis not present

## 2015-07-30 DIAGNOSIS — L509 Urticaria, unspecified: Secondary | ICD-10-CM | POA: Diagnosis not present

## 2015-07-30 DIAGNOSIS — R197 Diarrhea, unspecified: Secondary | ICD-10-CM | POA: Insufficient documentation

## 2015-07-30 DIAGNOSIS — I129 Hypertensive chronic kidney disease with stage 1 through stage 4 chronic kidney disease, or unspecified chronic kidney disease: Secondary | ICD-10-CM | POA: Diagnosis not present

## 2015-07-30 DIAGNOSIS — R0602 Shortness of breath: Secondary | ICD-10-CM | POA: Insufficient documentation

## 2015-07-30 DIAGNOSIS — E7800 Pure hypercholesterolemia, unspecified: Secondary | ICD-10-CM | POA: Diagnosis not present

## 2015-07-30 MED ORDER — ACETAMINOPHEN 500 MG PO TABS
1000.0000 mg | ORAL_TABLET | Freq: Once | ORAL | Status: AC
  Administered 2015-07-30: 1000 mg via ORAL
  Filled 2015-07-30: qty 2

## 2015-07-30 MED ORDER — METHYLPREDNISOLONE SODIUM SUCC 125 MG IJ SOLR
125.0000 mg | Freq: Once | INTRAMUSCULAR | Status: AC
  Administered 2015-07-30: 125 mg via INTRAVENOUS
  Filled 2015-07-30: qty 2

## 2015-07-30 MED ORDER — FAMOTIDINE IN NACL 20-0.9 MG/50ML-% IV SOLN
20.0000 mg | Freq: Once | INTRAVENOUS | Status: AC
  Administered 2015-07-30: 20 mg via INTRAVENOUS
  Filled 2015-07-30: qty 50

## 2015-07-30 MED ORDER — PREDNISONE 50 MG PO TABS: ORAL_TABLET | ORAL | Status: DC

## 2015-07-30 MED ORDER — SODIUM CHLORIDE 0.9 % IV BOLUS (SEPSIS)
1000.0000 mL | Freq: Once | INTRAVENOUS | Status: AC
  Administered 2015-07-30: 1000 mL via INTRAVENOUS

## 2015-07-30 MED ORDER — DIPHENHYDRAMINE HCL 50 MG/ML IJ SOLN
25.0000 mg | Freq: Once | INTRAMUSCULAR | Status: AC
  Administered 2015-07-30: 25 mg via INTRAVENOUS
  Filled 2015-07-30: qty 1
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
## 2015-07-30 NOTE — ED Notes (Addendum)
Per pt report was seen by PCP on Friday for rash on entire trunk, was given steroid shot at office and started on zantac and benadryl. Additionally , has planned back surgery(1/3) and was started on Bactroban nasal, one week ago started on clindamycin for sinus infection. Today rash worsen, no sob,no fever or n/v, reports some diarrhea. Took no medications today. Only c/o of pain is dull headache and lower back pain. Cannot take ibuprofen due to having only one kidney.

## 2015-07-30 NOTE — ED Notes (Signed)
Allergic reaction to antibiotic.  Has not taken medication since Friday morning.  Pt went to PCP-has been taking benadryl and Zantac without relief.  Pt noted to have hives, redness, eyes swollen.  Denies respiratory distress.  Reports some SOB, speaking in complete sentences.

## 2015-07-30 NOTE — Discharge Instructions (Signed)
Continue taking your benadryl and zantac along with the prednisone.    Hives Hives are itchy, red, swollen areas of the skin. They can vary in size and location on your body. Hives can come and go for hours or several days (acute hives) or for several weeks (chronic hives). Hives do not spread from person to person (noncontagious). They may get worse with scratching, exercise, and emotional stress. CAUSES   Allergic reaction to food, additives, or drugs.  Infections, including the common cold.  Illness, such as vasculitis, lupus, or thyroid disease.  Exposure to sunlight, heat, or cold.  Exercise.  Stress.  Contact with chemicals. SYMPTOMS   Red or white swollen patches on the skin. The patches may change size, shape, and location quickly and repeatedly.  Itching.  Swelling of the hands, feet, and face. This may occur if hives develop deeper in the skin. DIAGNOSIS  Your caregiver can usually tell what is wrong by performing a physical exam. Skin or blood tests may also be done to determine the cause of your hives. In some cases, the cause cannot be determined. TREATMENT  Mild cases usually get better with medicines such as antihistamines. Severe cases may require an emergency epinephrine injection. If the cause of your hives is known, treatment includes avoiding that trigger.  HOME CARE INSTRUCTIONS   Avoid causes that trigger your hives.  Take antihistamines as directed by your caregiver to reduce the severity of your hives. Non-sedating or low-sedating antihistamines are usually recommended. Do not drive while taking an antihistamine.  Take any other medicines prescribed for itching as directed by your caregiver.  Wear loose-fitting clothing.  Keep all follow-up appointments as directed by your caregiver. SEEK MEDICAL CARE IF:   You have persistent or severe itching that is not relieved with medicine.  You have painful or swollen joints. SEEK IMMEDIATE MEDICAL CARE  IF:   You have a fever.  Your tongue or lips are swollen.  You have trouble breathing or swallowing.  You feel tightness in the throat or chest.  You have abdominal pain. These problems may be the first sign of a life-threatening allergic reaction. Call your local emergency services (911 in [place] MAKE SURE YOU:   Understand these instructions.  Will watch your condition.  Will get help right away if you are not doing well or get worse.   This information is not intended to replace advice given to you by your health care provider. Make sure you discuss any questions you have with your health care provider.   Document Released: 07/16/2005 Document Revised: 07/21/2013 Document Reviewed: 10/09/2011 Elsevier Interactive Patient Education [employer].

## 2015-08-01 MED ORDER — CEFAZOLIN SODIUM-DEXTROSE 2-3 GM-% IV SOLR: 2.0000 g | INTRAVENOUS | Status: DC

## 2015-08-01 NOTE — Progress Notes (Signed)
Pt called in and stated he has been having problems with a clindamycin allergic reaction. Was seen in the ED yesterday and has had various treatments including IV meds and home meds. (see ED note).   He has been unable to reach anyone in Dr Clarice Pole office and was concerned about tomorrows surgery since is ED visit. He is also concerned about using the CHG on his sensitive skin. I told him to hold off on that and we will assess him tomorrow and scrub him as able. He states he will just "continue on keeping on" and plans to come in tomorrow as scheduled.

## 2015-08-02 ENCOUNTER — Other Ambulatory Visit (HOSPITAL_COMMUNITY): Payer: Self-pay | Admitting: Neurological Surgery

## 2015-08-02 ENCOUNTER — Other Ambulatory Visit: Payer: Self-pay | Admitting: Neurological Surgery

## 2015-08-02 ENCOUNTER — Ambulatory Visit (HOSPITAL_COMMUNITY): Admission: RE | Admit: 2015-08-02 | Payer: 59 | Source: Ambulatory Visit | Admitting: Neurological Surgery

## 2015-08-02 ENCOUNTER — Encounter (HOSPITAL_COMMUNITY): Admission: RE | Payer: 59 | Source: Ambulatory Visit

## 2015-08-02 DIAGNOSIS — Z01812 Encounter for preprocedural laboratory examination: Secondary | ICD-10-CM | POA: Diagnosis not present

## 2015-08-02 DIAGNOSIS — M48061 Spinal stenosis, lumbar region without neurogenic claudication: Secondary | ICD-10-CM

## 2015-08-02 DIAGNOSIS — M47816 Spondylosis without myelopathy or radiculopathy, lumbar region: Secondary | ICD-10-CM | POA: Diagnosis not present

## 2015-08-02 SURGERY — LUMBAR LAMINECTOMY WITH COFLEX 2 LEVEL
Anesthesia: General | Site: Back

## 2015-08-02 NOTE — H&P (Signed)
CHIEF COMPLAINT:                                          L2-L3 stenosis.  HISTORY OF PRESENT ILLNESS:                     [name] is a 65 year old, right-handed individual who has been having problems with his back and particularly his right lower extremity since about early or mid-2015.  An MRI was performed at that time and it was noted that he had a significant kidney mass.  He underwent surgery for that process and he was also, near the same time, being treated for prostate cancer.  He has completed that therapy but he notes that he still is having problems in his right hip and right lower extremity with numbness and some dysesthetic sensations in that right lower extremity.  He had prostate surgery in 07/2013 and he notes that overall his strength is there but he finds it hard to walk up stairs and he notes that he gets severe numbness in that right leg, particularly when standing for any length of time.  He has gotten some relief with the use of naproxen and Vicodin.  He is seen now with a recent MRI performed in April of this year to further evaluate his lower lumbar spine.  Taylor Elliott tells me that as an infant he had surgery on his spine for an apparent cystic mass in his spine.  This was done in [place]. He has not had any problems referable to that surgery but he did note that some years ago he had a diskectomy performed by Dr. Juanetta Elliott at the level of L4-L5.  IMAGING STUDIES:                                          To further his workup today in the office I obtained a lateral flexion/extension film and a singular AP view.  The radiographs demonstrate that the alignment of his spine is good in the coronal and sagittal planes.  There is evidence of a significant laminectomy from T12 down to L1 with some surgical clips in the lateral aspect of his spinal canal from that surgery.  This is the surgery that was done as a youth.  There is no malalignment of his vertebrae at L2-3 or any other  level and no significant shift is noted between flexion and extension.  The alignment in the coronal plane is quite normal.     PAST MEDICAL HISTORY:                                Past medical history is notable for some hypertension, renal cell carcinoma in the right kidney and the prostate cancer.  Prior Operations:   [facility] Medications and Allergies:  He notes no allergies to any medications.  Current medications include aspirin, multivitamin, Crestor, lisinopril, Norvasc, allopurinol, Vesicare and he gets some bee sting allergy shots.  NEUROLOGICAL EXAMINATION:                       On physical examination, I note that he stands straight and erect.  He has complete foot drop  on that left side.  He has good strength proximally in the iliopsoas and the gluteus but he does note that he has had some chronic problem with balancing onto the left leg.  He has good strength on the right side but he notes that recently he has been having some problems balancing on that right leg.  His tibialis anterior strength and gastroc strength on the right side is intact.  His reflexes are absent symmetrically in the patellae and the Achilles both.  They are 1+ in both biceps and 1+ in both triceps and 1+ in the brachioradialis.  Sensation is diminished at the ankles to vibration but intact at the knees.    IMPRESSION:                                                   The patient has evidence of significant spondylosis with facet hypertrophy at the level of L2-L3 contributing to a moderately severe central canal stenosis.  He has residual from former surgery done years ago as an infant in his upper lumbar spine.  He has good alignment in both the coronal and sagittal planes with flexion and extension and his lower lumbar spine appears to be amply patent below the level of L2-L3.  He has had conservative management with epidural steroid injections and physical therapy with little overall improvement and perhaps worsening  symptoms. We discussed surgical decompression with laminotomies and coflex and he is now admitted for that procdure.

## 2015-08-11 ENCOUNTER — Ambulatory Visit (HOSPITAL_COMMUNITY)
Admission: RE | Admit: 2015-08-11 | Discharge: 2015-08-11 | Disposition: A | Discharge status: Home / Self Care | Payer: 59 | Source: Ambulatory Visit | Attending: Neurological Surgery | Admitting: Neurological Surgery

## 2015-08-11 DIAGNOSIS — Z85528 Personal history of other malignant neoplasm of kidney: Secondary | ICD-10-CM | POA: Diagnosis not present

## 2015-08-11 DIAGNOSIS — M48061 Spinal stenosis, lumbar region without neurogenic claudication: Secondary | ICD-10-CM

## 2015-08-11 DIAGNOSIS — M5124 Other intervertebral disc displacement, thoracic region: Secondary | ICD-10-CM | POA: Diagnosis not present

## 2015-08-11 DIAGNOSIS — M4806 Spinal stenosis, lumbar region: Secondary | ICD-10-CM | POA: Diagnosis not present

## 2015-08-11 DIAGNOSIS — M4316 Spondylolisthesis, lumbar region: Secondary | ICD-10-CM | POA: Insufficient documentation

## 2015-08-11 DIAGNOSIS — Z905 Acquired absence of kidney: Secondary | ICD-10-CM | POA: Insufficient documentation

## 2015-08-11 DIAGNOSIS — M5137 Other intervertebral disc degeneration, lumbosacral region: Secondary | ICD-10-CM | POA: Diagnosis not present

## 2015-08-11 MED ORDER — LIDOCAINE HCL (PF) 1 % IJ SOLN
INTRAMUSCULAR | Status: AC
  Administered 2015-08-11: 5 mL via INTRAMUSCULAR
  Filled 2015-08-11: qty 5

## 2015-08-11 MED ORDER — ONDANSETRON HCL 4 MG/2ML IJ SOLN: 4.0000 mg | Freq: Four times a day (QID) | INTRAMUSCULAR | Status: DC | PRN

## 2015-08-11 MED ORDER — HYDROCODONE-ACETAMINOPHEN 5-325 MG PO TABS
ORAL_TABLET | ORAL | Status: AC
  Filled 2015-08-11: qty 2

## 2015-08-11 MED ORDER — DEXAMETHASONE 4 MG PO TABS
4.0000 mg | ORAL_TABLET | Freq: Once | ORAL | Status: AC
  Administered 2015-08-11: 4 mg via ORAL
  Filled 2015-08-11: qty 1

## 2015-08-11 MED ORDER — HYDROCODONE-ACETAMINOPHEN 5-325 MG PO TABS
1.0000 | ORAL_TABLET | ORAL | Status: DC | PRN
Start: 2015-08-11 — End: 2015-08-12
  Administered 2015-08-11: 2 via ORAL

## 2015-08-11 MED ORDER — IOHEXOL 180 MG/ML  SOLN
20.0000 mL | Freq: Once | INTRAMUSCULAR | Status: AC | PRN
  Administered 2015-08-11: 10 mL via INTRATHECAL

## 2015-08-11 MED ORDER — DIAZEPAM 5 MG PO TABS
10.0000 mg | ORAL_TABLET | Freq: Once | ORAL | Status: AC
  Administered 2015-08-11: 10 mg via ORAL

## 2015-08-11 MED ORDER — DIAZEPAM 5 MG PO TABS
ORAL_TABLET | ORAL | Status: AC
  Filled 2015-08-11: qty 2

## 2015-08-11 NOTE — Procedures (Addendum)
Mr. Colbert Curenton a 65 year old individual is had progressive back pain and bilateral lower extremity pain and weakness when he walks any distance. He had an MRI performed last year in April and this demonstrated a significant stenosis at the levels of L2-3 and L3-4 and he was advised regarding surgical decompression with a Coflex device. On review of his films was noted however that he had previous surgery as a child for a cystic structure in spine. A CT scan Appeared that his spinous processes down at the levels concern were violated and because of this it was advised that we should hold off on surgery and consider further imaging studies as there is concern that he may have arachnoiditis or some other mass in the region of the lumbar spine contributing to the stenosis. A myelogram was suggested and he is now undergoing this procedure.   Pre op Dx: Lumbar stenosis with neurogenic claudication and radiculopathy Post op Dx: Lumbar stenosis with neurogenic claudication and radiculopathy Procedure: Lumbar myelogram Surgeon: Kailan Carmen Puncture level: L4-5 and L1 to Fluid color: Clear colorless Injection: Iohexol 180 10 mL Findings: Severe stenosis with what appears to be a complete myelographic block at the levels of L2-3 and L3-4 further evaluation with CT scanning

## 2015-08-11 NOTE — Discharge Instructions (Signed)

## 2015-08-12 ENCOUNTER — Other Ambulatory Visit: Payer: Self-pay | Admitting: Neurological Surgery

## 2015-08-15 ENCOUNTER — Telehealth: Payer: Self-pay | Admitting: Hematology & Oncology

## 2015-08-15 NOTE — Telephone Encounter (Signed)
Pt called to cancel 1/24. Will cb to reschedule at a later time.

## 2015-08-18 MED ORDER — CEFAZOLIN SODIUM-DEXTROSE 2-3 GM-% IV SOLR
2.0000 g | INTRAVENOUS | Status: AC
  Administered 2015-08-19: 2 g via INTRAVENOUS
  Filled 2015-08-18: qty 50

## 2015-08-18 NOTE — Progress Notes (Signed)
Taylor Elliott voiced concern regarding using the surgical scrub, "I had a bad reaction to Clindamcin since I was seen at [facility] and I just don't want to break out in hives.  I suggested that patient try  The CHG on a small area of body, arm, abdomen and see how he reacts, if any reaction do not use CHG tonight or in am, use Dial.

## 2015-08-19 ENCOUNTER — Ambulatory Visit (HOSPITAL_COMMUNITY): Payer: 59 | Admitting: Certified Registered Nurse Anesthetist

## 2015-08-19 ENCOUNTER — Ambulatory Visit (HOSPITAL_COMMUNITY): Payer: 59

## 2015-08-19 ENCOUNTER — Encounter (HOSPITAL_COMMUNITY)
Admission: AD | Disposition: A | Discharge status: Home / Self Care | Payer: Self-pay | Source: Ambulatory Visit | Attending: Neurological Surgery

## 2015-08-19 ENCOUNTER — Encounter (HOSPITAL_COMMUNITY): Payer: Self-pay | Admitting: General Practice

## 2015-08-19 ENCOUNTER — Inpatient Hospital Stay (HOSPITAL_COMMUNITY)
Admission: AD | Admit: 2015-08-19 | Discharge: 2015-08-20 | DRG: 520 | Disposition: A | Discharge status: Home / Self Care | Payer: 59 | Source: Ambulatory Visit | Attending: Neurological Surgery | Admitting: Neurological Surgery

## 2015-08-19 DIAGNOSIS — G629 Polyneuropathy, unspecified: Secondary | ICD-10-CM | POA: Diagnosis present

## 2015-08-19 DIAGNOSIS — Z419 Encounter for procedure for purposes other than remedying health state, unspecified: Secondary | ICD-10-CM

## 2015-08-19 DIAGNOSIS — Z79899 Other long term (current) drug therapy: Secondary | ICD-10-CM | POA: Diagnosis not present

## 2015-08-19 DIAGNOSIS — M5416 Radiculopathy, lumbar region: Secondary | ICD-10-CM | POA: Diagnosis present

## 2015-08-19 DIAGNOSIS — Z8546 Personal history of malignant neoplasm of prostate: Secondary | ICD-10-CM

## 2015-08-19 DIAGNOSIS — M545 Low back pain: Secondary | ICD-10-CM | POA: Diagnosis present

## 2015-08-19 DIAGNOSIS — Z7982 Long term (current) use of aspirin: Secondary | ICD-10-CM

## 2015-08-19 DIAGNOSIS — K219 Gastro-esophageal reflux disease without esophagitis: Secondary | ICD-10-CM | POA: Diagnosis not present

## 2015-08-19 DIAGNOSIS — M1990 Unspecified osteoarthritis, unspecified site: Secondary | ICD-10-CM | POA: Diagnosis not present

## 2015-08-19 DIAGNOSIS — M4806 Spinal stenosis, lumbar region: Secondary | ICD-10-CM | POA: Diagnosis not present

## 2015-08-19 DIAGNOSIS — I10 Essential (primary) hypertension: Secondary | ICD-10-CM | POA: Diagnosis present

## 2015-08-19 DIAGNOSIS — M48062 Spinal stenosis, lumbar region with neurogenic claudication: Secondary | ICD-10-CM | POA: Diagnosis present

## 2015-08-19 DIAGNOSIS — Z85528 Personal history of other malignant neoplasm of kidney: Secondary | ICD-10-CM | POA: Diagnosis not present

## 2015-08-19 DIAGNOSIS — N189 Chronic kidney disease, unspecified: Secondary | ICD-10-CM | POA: Diagnosis not present

## 2015-08-19 HISTORY — PX: LUMBAR LAMINECTOMY/DECOMPRESSION MICRODISCECTOMY: SHX5026

## 2015-08-19 LAB — BASIC METABOLIC PANEL
Anion gap: 10 (ref 5–15)
BUN: 21 mg/dL — ABNORMAL HIGH (ref 6–20)
CO2: 22 mmol/L (ref 22–32)
Calcium: 9.6 mg/dL (ref 8.9–10.3)
Chloride: 108 mmol/L (ref 101–111)
Creatinine, Ser: 1.4 mg/dL — ABNORMAL HIGH (ref 0.61–1.24)
GFR calc Af Amer: >60 mL/min (ref >60)
GFR calc non Af Amer: 52 mL/min — ABNORMAL LOW (ref >60)
Glucose, Bld: 103 mg/dL — ABNORMAL HIGH (ref 65–99)
Potassium: 4.7 mmol/L (ref 3.5–5.1)
Sodium: 140 mmol/L (ref 135–145)

## 2015-08-19 LAB — CBC
HCT: 38.7 % — ABNORMAL LOW (ref 39.0–52.0)
Hemoglobin: 13 g/dL (ref 13.0–17.0)
MCH: 31.3 pg (ref 26.0–34.0)
MCHC: 33.6 g/dL (ref 30.0–36.0)
MCV: 93 fL (ref 78.0–100.0)
Platelets: 170 10*3/uL (ref 150–400)
RBC: 4.16 MIL/uL — ABNORMAL LOW (ref 4.22–5.81)
RDW: 14.3 % (ref 11.5–15.5)
WBC: 4.3 10*3/uL (ref 4.0–10.5)

## 2015-08-19 SURGERY — LUMBAR LAMINECTOMY/DECOMPRESSION MICRODISCECTOMY 2 LEVELS
Anesthesia: General | Site: Back

## 2015-08-19 MED ORDER — HEMOSTATIC AGENTS (NO CHARGE) OPTIME
TOPICAL | Status: DC | PRN
  Administered 2015-08-19: 1 via TOPICAL

## 2015-08-19 MED ORDER — HYDROMORPHONE HCL 1 MG/ML IJ SOLN
INTRAMUSCULAR | Status: AC
  Filled 2015-08-19: qty 1

## 2015-08-19 MED ORDER — MUPIROCIN 2 % EX OINT
TOPICAL_OINTMENT | CUTANEOUS | Status: AC
  Filled 2015-08-19: qty 22

## 2015-08-19 MED ORDER — HYDROCODONE-ACETAMINOPHEN 5-325 MG PO TABS: 1.0000 | ORAL_TABLET | ORAL | Status: DC | PRN

## 2015-08-19 MED ORDER — MUPIROCIN 2 % EX OINT: 1.0000 "application " | TOPICAL_OINTMENT | Freq: Once | CUTANEOUS | Status: DC

## 2015-08-19 MED ORDER — PHENYLEPHRINE HCL 10 MG/ML IJ SOLN
INTRAMUSCULAR | Status: DC | PRN
  Administered 2015-08-19 (×4): 80 ug via INTRAVENOUS

## 2015-08-19 MED ORDER — SUCCINYLCHOLINE CHLORIDE 20 MG/ML IJ SOLN
INTRAMUSCULAR | Status: AC
  Filled 2015-08-19: qty 1

## 2015-08-19 MED ORDER — ROSUVASTATIN CALCIUM 20 MG PO TABS
10.0000 mg | ORAL_TABLET | Freq: Every morning | ORAL | Status: DC
  Administered 2015-08-19 – 2015-08-20 (×2): 10 mg via ORAL
  Filled 2015-08-19 (×2): qty 1

## 2015-08-19 MED ORDER — ONDANSETRON HCL 4 MG/2ML IJ SOLN: 4.0000 mg | INTRAMUSCULAR | Status: DC | PRN

## 2015-08-19 MED ORDER — FAMOTIDINE 20 MG PO TABS
20.0000 mg | ORAL_TABLET | Freq: Two times a day (BID) | ORAL | Status: DC
  Administered 2015-08-19 – 2015-08-20 (×2): 20 mg via ORAL
  Filled 2015-08-19 (×2): qty 1

## 2015-08-19 MED ORDER — LISINOPRIL 20 MG PO TABS
20.0000 mg | ORAL_TABLET | Freq: Every morning | ORAL | Status: DC
  Administered 2015-08-20: 20 mg via ORAL
  Filled 2015-08-19: qty 1

## 2015-08-19 MED ORDER — ONDANSETRON HCL 4 MG/2ML IJ SOLN
INTRAMUSCULAR | Status: AC
  Filled 2015-08-19: qty 2

## 2015-08-19 MED ORDER — LACTATED RINGERS IV SOLN
INTRAVENOUS | Status: DC
  Administered 2015-08-19 (×3): via INTRAVENOUS

## 2015-08-19 MED ORDER — SODIUM CHLORIDE 0.9 % IV SOLN: 250.0000 mL | INTRAVENOUS | Status: DC

## 2015-08-19 MED ORDER — METHOCARBAMOL 1000 MG/10ML IJ SOLN
500.0000 mg | Freq: Four times a day (QID) | INTRAMUSCULAR | Status: DC | PRN
  Filled 2015-08-19: qty 5

## 2015-08-19 MED ORDER — LIDOCAINE HCL (CARDIAC) 20 MG/ML IV SOLN
INTRAVENOUS | Status: DC | PRN
  Administered 2015-08-19: 50 mg via INTRAVENOUS

## 2015-08-19 MED ORDER — HYDROMORPHONE HCL 1 MG/ML IJ SOLN: 0.5000 mg | INTRAMUSCULAR | Status: DC | PRN

## 2015-08-19 MED ORDER — DEXAMETHASONE 4 MG PO TABS
2.0000 mg | ORAL_TABLET | Freq: Two times a day (BID) | ORAL | Status: DC
  Administered 2015-08-19 – 2015-08-20 (×2): 2 mg via ORAL
  Filled 2015-08-19 (×2): qty 1

## 2015-08-19 MED ORDER — ACETAMINOPHEN 650 MG RE SUPP: 650.0000 mg | RECTAL | Status: DC | PRN

## 2015-08-19 MED ORDER — PHENYLEPHRINE 40 MCG/ML (10ML) SYRINGE FOR IV PUSH (FOR BLOOD PRESSURE SUPPORT)
PREFILLED_SYRINGE | INTRAVENOUS | Status: AC
  Filled 2015-08-19: qty 10

## 2015-08-19 MED ORDER — ALLOPURINOL 100 MG PO TABS
200.0000 mg | ORAL_TABLET | Freq: Every morning | ORAL | Status: DC
  Administered 2015-08-20: 200 mg via ORAL
  Filled 2015-08-19: qty 2

## 2015-08-19 MED ORDER — METHOCARBAMOL 500 MG PO TABS
500.0000 mg | ORAL_TABLET | Freq: Four times a day (QID) | ORAL | Status: DC | PRN
  Administered 2015-08-19 – 2015-08-20 (×2): 500 mg via ORAL
  Filled 2015-08-19 (×2): qty 1

## 2015-08-19 MED ORDER — SODIUM CHLORIDE 0.9 % IR SOLN
Status: DC | PRN
  Administered 2015-08-19: 14:00:00

## 2015-08-19 MED ORDER — VANCOMYCIN HCL 1000 MG IV SOLR
INTRAVENOUS | Status: AC
  Filled 2015-08-19: qty 1000

## 2015-08-19 MED ORDER — VANCOMYCIN HCL 1000 MG IV SOLR
INTRAVENOUS | Status: DC | PRN
  Administered 2015-08-19: 1000 mg via TOPICAL

## 2015-08-19 MED ORDER — BUPIVACAINE HCL (PF) 0.5 % IJ SOLN
INTRAMUSCULAR | Status: DC | PRN
  Administered 2015-08-19: 20 mL
  Administered 2015-08-19: 5 mL

## 2015-08-19 MED ORDER — NEOSTIGMINE METHYLSULFATE 10 MG/10ML IV SOLN
INTRAVENOUS | Status: DC | PRN
  Administered 2015-08-19: 4 mg via INTRAVENOUS

## 2015-08-19 MED ORDER — ROCURONIUM BROMIDE 100 MG/10ML IV SOLN
INTRAVENOUS | Status: DC | PRN
  Administered 2015-08-19 (×2): 10 mg via INTRAVENOUS
  Administered 2015-08-19: 40 mg via INTRAVENOUS

## 2015-08-19 MED ORDER — ACETAMINOPHEN 325 MG PO TABS: 650.0000 mg | ORAL_TABLET | ORAL | Status: DC | PRN

## 2015-08-19 MED ORDER — ONDANSETRON HCL 4 MG/2ML IJ SOLN
INTRAMUSCULAR | Status: DC | PRN
  Administered 2015-08-19: 4 mg via INTRAVENOUS

## 2015-08-19 MED ORDER — THROMBIN 5000 UNITS EX SOLR
CUTANEOUS | Status: DC | PRN
  Administered 2015-08-19 (×2): 5000 [IU] via TOPICAL

## 2015-08-19 MED ORDER — EPHEDRINE SULFATE 50 MG/ML IJ SOLN
INTRAMUSCULAR | Status: AC
  Filled 2015-08-19: qty 1

## 2015-08-19 MED ORDER — LIDOCAINE-EPINEPHRINE 1 %-1:100000 IJ SOLN
INTRAMUSCULAR | Status: DC | PRN
  Administered 2015-08-19: 5 mL

## 2015-08-19 MED ORDER — DEXAMETHASONE SODIUM PHOSPHATE 10 MG/ML IJ SOLN
INTRAMUSCULAR | Status: DC | PRN
  Administered 2015-08-19: 10 mg via INTRAVENOUS

## 2015-08-19 MED ORDER — PANTOPRAZOLE SODIUM 40 MG PO TBEC
40.0000 mg | DELAYED_RELEASE_TABLET | Freq: Every day | ORAL | Status: DC
  Administered 2015-08-20: 40 mg via ORAL
  Filled 2015-08-19 (×2): qty 1

## 2015-08-19 MED ORDER — ROCURONIUM BROMIDE 50 MG/5ML IV SOLN
INTRAVENOUS | Status: AC
  Filled 2015-08-19: qty 1

## 2015-08-19 MED ORDER — PHENOL 1.4 % MT LIQD: 1.0000 | OROMUCOSAL | Status: DC | PRN

## 2015-08-19 MED ORDER — PROPOFOL 10 MG/ML IV BOLUS
INTRAVENOUS | Status: AC
  Filled 2015-08-19: qty 20

## 2015-08-19 MED ORDER — GLYCOPYRROLATE 0.2 MG/ML IJ SOLN
INTRAMUSCULAR | Status: DC | PRN
  Administered 2015-08-19: 0.6 mg via INTRAVENOUS

## 2015-08-19 MED ORDER — KETOROLAC TROMETHAMINE 15 MG/ML IJ SOLN
15.0000 mg | Freq: Four times a day (QID) | INTRAMUSCULAR | Status: DC
  Administered 2015-08-19 (×2): 15 mg via INTRAVENOUS
  Filled 2015-08-19 (×3): qty 1

## 2015-08-19 MED ORDER — EPHEDRINE SULFATE 50 MG/ML IJ SOLN
INTRAMUSCULAR | Status: DC | PRN
  Administered 2015-08-19 (×2): 10 mg via INTRAVENOUS

## 2015-08-19 MED ORDER — MIDAZOLAM HCL 5 MG/5ML IJ SOLN
INTRAMUSCULAR | Status: DC | PRN
  Administered 2015-08-19: 2 mg via INTRAVENOUS

## 2015-08-19 MED ORDER — ALUM & MAG HYDROXIDE-SIMETH 200-200-20 MG/5ML PO SUSP: 30.0000 mL | Freq: Four times a day (QID) | ORAL | Status: DC | PRN

## 2015-08-19 MED ORDER — HYDROCODONE-ACETAMINOPHEN 7.5-325 MG PO TABS: 1.0000 | ORAL_TABLET | Freq: Once | ORAL | Status: DC | PRN

## 2015-08-19 MED ORDER — FENTANYL CITRATE (PF) 250 MCG/5ML IJ SOLN
INTRAMUSCULAR | Status: AC
  Filled 2015-08-19: qty 5

## 2015-08-19 MED ORDER — MIDAZOLAM HCL 2 MG/2ML IJ SOLN
INTRAMUSCULAR | Status: AC
  Filled 2015-08-19: qty 2

## 2015-08-19 MED ORDER — FENTANYL CITRATE (PF) 100 MCG/2ML IJ SOLN
INTRAMUSCULAR | Status: DC | PRN
  Administered 2015-08-19: 100 ug via INTRAVENOUS
  Administered 2015-08-19: 50 ug via INTRAVENOUS

## 2015-08-19 MED ORDER — OXYCODONE-ACETAMINOPHEN 5-325 MG PO TABS
1.0000 | ORAL_TABLET | ORAL | Status: DC | PRN
  Administered 2015-08-19 – 2015-08-20 (×4): 2 via ORAL
  Filled 2015-08-19 (×4): qty 2

## 2015-08-19 MED ORDER — LOPERAMIDE HCL 2 MG PO CAPS
2.0000 mg | ORAL_CAPSULE | ORAL | Status: DC | PRN
  Filled 2015-08-19: qty 1

## 2015-08-19 MED ORDER — KETOROLAC TROMETHAMINE 15 MG/ML IJ SOLN
INTRAMUSCULAR | Status: AC
  Filled 2015-08-19: qty 1

## 2015-08-19 MED ORDER — MENTHOL 3 MG MT LOZG: 1.0000 | LOZENGE | OROMUCOSAL | Status: DC | PRN

## 2015-08-19 MED ORDER — PROPOFOL 10 MG/ML IV BOLUS
INTRAVENOUS | Status: DC | PRN
  Administered 2015-08-19: 200 mg via INTRAVENOUS

## 2015-08-19 MED ORDER — LIDOCAINE HCL (CARDIAC) 20 MG/ML IV SOLN
INTRAVENOUS | Status: AC
  Filled 2015-08-19: qty 5

## 2015-08-19 MED ORDER — HYDROMORPHONE HCL 1 MG/ML IJ SOLN
0.2500 mg | INTRAMUSCULAR | Status: DC | PRN
  Administered 2015-08-19 (×4): 0.5 mg via INTRAVENOUS

## 2015-08-19 MED ORDER — DARIFENACIN HYDROBROMIDE ER 7.5 MG PO TB24
7.5000 mg | ORAL_TABLET | Freq: Every day | ORAL | Status: DC
  Administered 2015-08-19 – 2015-08-20 (×2): 7.5 mg via ORAL
  Filled 2015-08-19 (×2): qty 1

## 2015-08-19 MED ORDER — 0.9 % SODIUM CHLORIDE (POUR BTL) OPTIME
TOPICAL | Status: DC | PRN
  Administered 2015-08-19: 1000 mL

## 2015-08-19 MED ORDER — SODIUM CHLORIDE 0.9 % IJ SOLN: 3.0000 mL | INTRAMUSCULAR | Status: DC | PRN

## 2015-08-19 MED ORDER — AMLODIPINE BESYLATE 10 MG PO TABS
10.0000 mg | ORAL_TABLET | Freq: Every morning | ORAL | Status: DC
  Administered 2015-08-20: 10 mg via ORAL
  Filled 2015-08-19: qty 1

## 2015-08-19 MED ORDER — PROMETHAZINE HCL 25 MG/ML IJ SOLN: 6.2500 mg | INTRAMUSCULAR | Status: DC | PRN

## 2015-08-19 MED ORDER — SODIUM CHLORIDE 0.9 % IJ SOLN: 3.0000 mL | Freq: Two times a day (BID) | INTRAMUSCULAR | Status: DC

## 2015-08-19 SURGICAL SUPPLY — 51 items
BAG DECANTER FOR FLEXI CONT (MISCELLANEOUS) ×3 IMPLANT
BLADE CLIPPER SURG (BLADE) IMPLANT
BUR ACORN 6.0 (BURR) IMPLANT
BUR ACORN 6.0MM (BURR)
BUR MATCHSTICK NEURO 3.0 LAGG (BURR) ×3 IMPLANT
CANISTER SUCT 3000ML PPV (MISCELLANEOUS) ×3 IMPLANT
DECANTER SPIKE VIAL GLASS SM (MISCELLANEOUS) ×3 IMPLANT
DERMABOND ADVANCED (GAUZE/BANDAGES/DRESSINGS) ×2
DERMABOND ADVANCED .7 DNX12 (GAUZE/BANDAGES/DRESSINGS) ×1 IMPLANT
DRAPE LAPAROTOMY 100X72X124 (DRAPES) ×3 IMPLANT
DRAPE MICROSCOPE LEICA (MISCELLANEOUS) IMPLANT
DRAPE POUCH INSTRU U-SHP 10X18 (DRAPES) ×3 IMPLANT
DRAPE PROXIMA HALF (DRAPES) IMPLANT
DRSG OPSITE POSTOP 4X6 (GAUZE/BANDAGES/DRESSINGS) ×6 IMPLANT
DURAPREP 26ML APPLICATOR (WOUND CARE) ×3 IMPLANT
ELECT REM PT RETURN 9FT ADLT (ELECTROSURGICAL) ×3
ELECTRODE REM PT RTRN 9FT ADLT (ELECTROSURGICAL) ×1 IMPLANT
GAUZE SPONGE 4X4 12PLY STRL (GAUZE/BANDAGES/DRESSINGS) ×3 IMPLANT
GAUZE SPONGE 4X4 16PLY XRAY LF (GAUZE/BANDAGES/DRESSINGS) IMPLANT
GLOVE BIO SURGEON STRL SZ 6.5 (GLOVE) ×2 IMPLANT
GLOVE BIO SURGEON STRL SZ8 (GLOVE) ×3 IMPLANT
GLOVE BIO SURGEONS STRL SZ 6.5 (GLOVE) ×1
GLOVE BIOGEL PI IND STRL 8.5 (GLOVE) ×1 IMPLANT
GLOVE BIOGEL PI INDICATOR 8.5 (GLOVE) ×2
GLOVE ECLIPSE 8.5 STRL (GLOVE) ×3 IMPLANT
GLOVE EXAM NITRILE LRG STRL (GLOVE) IMPLANT
GLOVE EXAM NITRILE MD LF STRL (GLOVE) IMPLANT
GLOVE EXAM NITRILE XL STR (GLOVE) IMPLANT
GLOVE EXAM NITRILE XS STR PU (GLOVE) IMPLANT
GOWN STRL REUS W/ TWL LRG LVL3 (GOWN DISPOSABLE) IMPLANT
GOWN STRL REUS W/ TWL XL LVL3 (GOWN DISPOSABLE) ×1 IMPLANT
GOWN STRL REUS W/TWL 2XL LVL3 (GOWN DISPOSABLE) ×3 IMPLANT
GOWN STRL REUS W/TWL LRG LVL3 (GOWN DISPOSABLE)
GOWN STRL REUS W/TWL XL LVL3 (GOWN DISPOSABLE) ×2
KIT BASIN OR (CUSTOM PROCEDURE TRAY) ×3 IMPLANT
KIT ROOM TURNOVER OR (KITS) ×3 IMPLANT
NEEDLE HYPO 22GX1.5 SAFETY (NEEDLE) ×3 IMPLANT
NEEDLE SPNL 20GX3.5 QUINCKE YW (NEEDLE) IMPLANT
NS IRRIG 1000ML POUR BTL (IV SOLUTION) ×3 IMPLANT
PACK LAMINECTOMY NEURO (CUSTOM PROCEDURE TRAY) ×3 IMPLANT
PAD ARMBOARD 7.5X6 YLW CONV (MISCELLANEOUS) ×9 IMPLANT
PATTIES SURGICAL .5 X1 (DISPOSABLE) ×3 IMPLANT
RUBBERBAND STERILE (MISCELLANEOUS) IMPLANT
SPONGE SURGIFOAM ABS GEL SZ50 (HEMOSTASIS) ×3 IMPLANT
SUT VIC AB 1 CT1 18XBRD ANBCTR (SUTURE) ×1 IMPLANT
SUT VIC AB 1 CT1 8-18 (SUTURE) ×2
SUT VIC AB 2-0 CP2 18 (SUTURE) ×3 IMPLANT
SUT VIC AB 3-0 SH 8-18 (SUTURE) ×3 IMPLANT
TOWEL OR 17X24 6PK STRL BLUE (TOWEL DISPOSABLE) ×3 IMPLANT
TOWEL OR 17X26 10 PK STRL BLUE (TOWEL DISPOSABLE) ×3 IMPLANT
WATER STERILE IRR 1000ML POUR (IV SOLUTION) ×3 IMPLANT

## 2015-08-19 NOTE — H&P (Signed)
CHIEF COMPLAINT: L2-L3 stenosis.  HISTORY OF PRESENT ILLNESS: Taylor Elliott is a 65 year old, right-handed individual who has been having problems with his back and particularly his right lower extremity since about early or mid-2015. An MRI was performed at that time and it was noted that he had a significant kidney mass. He underwent surgery for that process and he was also, near the same time, being treated for prostate cancer. He has completed that therapy but he notes that he still is having problems in his right hip and right lower extremity with numbness and some dysesthetic sensations in that right lower extremity. He had prostate surgery in 07/2013 and he notes that overall his strength is there but he finds it hard to walk up stairs and he notes that he gets severe numbness in that right leg, particularly when standing for any length of time. He has gotten some relief with the use of naproxen and Vicodin. He is seen now with a recent MRI performed in April of this year to further evaluate his lower lumbar spine. [name] tells me that as an infant he had surgery on his spine for an apparent cystic mass in his spine. This was done in [place]. He has not had any problems referable to that surgery but he did note that some years ago he had a diskectomy performed by Dr. Juanetta Snow at the level of L4-L5.  IMAGING STUDIES: To further his workup today in the office I obtained a lateral flexion/extension film and a singular AP view. The radiographs demonstrate that the alignment of his spine is good in the coronal and sagittal planes. There is evidence of a significant laminectomy from T12 down to L1 with some surgical clips in the lateral aspect of his spinal canal from that surgery. This is the surgery that was done as a youth. There is no malalignment of his vertebrae at L2-3 or any other level and no significant shift is noted between flexion and extension. The alignment in the coronal plane is quite  normal.  PAST MEDICAL HISTORY: Past medical history is notable for some hypertension, renal cell carcinoma in the right kidney and the prostate cancer.  Prior Operations:  Medications and Allergies: He notes no allergies to any medications. Current medications include aspirin, multivitamin, Crestor, lisinopril, Norvasc, allopurinol, Vesicare and he gets some bee sting allergy shots. NEUROLOGICAL EXAMINATION: On physical examination, I note that he stands straight and erect. He has complete foot drop on that left side. He has good strength proximally in the iliopsoas and the gluteus but he does note that he has had some chronic problem with balancing onto the left leg. He has good strength on the right side but he notes that recently he has been having some problems balancing on that right leg. His tibialis anterior strength and gastroc strength on the right side is intact. His reflexes are absent symmetrically in the patellae and the Achilles both. They are 1+ in both biceps and 1+ in both triceps and 1+ in the brachioradialis. Sensation is diminished at the ankles to vibration but intact at the knees.  IMPRESSION: The patient has evidence of significant spondylosis with facet hypertrophy at the level of L2-L3 contributing to a moderately severe central canal stenosis. He has residual from former surgery done years ago as an infant in his upper lumbar spine. He has good alignment in both the coronal and sagittal planes with flexion and extension and his lower lumbar spine appears to be amply patent  below the level of L2-L3. He has had conservative management with epidural steroid injections and physical therapy with little overall improvement and perhaps worsening symptoms. We discussed surgical decompression with laminotomies . He is not a candidate for coflex because his spinous processes have been removed. A laminectomy and decompression is being performed.

## 2015-08-19 NOTE — Transfer of Care (Signed)
Immediate Anesthesia Transfer of Care Note  Patient: Taylor Elliott  Procedure(s) Performed: Procedure(s) with comments: Lumbar One-Two/Two-Three Laminectomy (N/A) - Lumbar One-Two/Two-Three Laminectomy  Patient Location: PACU  Anesthesia Type:General  Level of Consciousness: awake  Airway & Oxygen Therapy: Patient Spontanous Breathing and Patient connected to nasal cannula oxygen  Post-op Assessment: Report given to RN, Post -op Vital signs reviewed and stable and Patient moving all extremities X 4  Post vital signs: stable  Last Vitals:  Filed Vitals:   08/19/15 1116 08/19/15 1621  BP: 145/68 146/78  Pulse: 85 109  Temp: 36.8 C 36.5 C  Resp: 20 34    Complications: No apparent anesthesia complications

## 2015-08-19 NOTE — Anesthesia Preprocedure Evaluation (Addendum)
Anesthesia Evaluation  Patient identified by MRN, date of birth, ID band Patient awake    Reviewed: Allergy & Precautions, NPO status , Patient's Chart, lab work & pertinent test results  Airway Mallampati: III  TM Distance: >3 FB Neck ROM: Full    Dental  (+) Dental Advisory Given   Pulmonary former smoker,    breath sounds clear to auscultation       Cardiovascular hypertension, Pt. on medications  Rhythm:Regular Rate:Normal     Neuro/Psych negative neurological ROS     GI/Hepatic Neg liver ROS, GERD  ,  Endo/Other  negative endocrine ROS  Renal/GU CRFRenal disease (s/p nephrectomy 2/2 RCC)     Musculoskeletal  (+) Arthritis ,   Abdominal   Peds  Hematology negative hematology ROS (+)   Anesthesia Other Findings   Reproductive/Obstetrics                            Lab Results  Component Value Date   WBC 4.3 08/19/2015   HGB 13.0 08/19/2015   HCT 38.7* 08/19/2015   MCV 93.0 08/19/2015   PLT 170 08/19/2015   Lab Results  Component Value Date   CREATININE 1.44* 07/22/2015   BUN 12 07/22/2015   NA 139 07/22/2015   K 4.9 07/22/2015   CL 106 07/22/2015   CO2 26 07/22/2015    Anesthesia Physical Anesthesia Plan  ASA: II  Anesthesia Plan: General   Post-op Pain Management:    Induction: Intravenous  Airway Management Planned: Oral ETT  Additional Equipment:   Intra-op Plan:   Post-operative Plan: Extubation in OR  Informed Consent: I have reviewed the patients History and Physical, chart, labs and discussed the procedure including the risks, benefits and alternatives for the proposed anesthesia with the patient or authorized representative who has indicated his/her understanding and acceptance.   Dental advisory given  Plan Discussed with: CRNA  Anesthesia Plan Comments:         Anesthesia Quick Evaluation

## 2015-08-19 NOTE — Op Note (Signed)
Date of surgery: 08/19/2015 Preoperative diagnosis: Lumbar stenosis L1 to L2-3, with neurogenic claudication Postoperative diagnosis: Lumbar stenosis L1-2 L2-3 with neurogenic claudication Procedure: Laminectomy L1-2 and L2-3 with decompression of central canal and lateral recesses including the L2 and L3 nerve roots Surgeon: Kristeen Miss First assistant: Sherley Bounds M.D. Anesthesia: Gen. endotracheal Indications: Mr. Albeiro Trompeter is a 65 year old individual who had child hood neurosurgery on his spine to remove some cysts. He's had some permanent lower extremity weakness from that and bladder incontinence issues however it was noted recently that he's had increasing pain in his back and his legs with worsening weakness in his legs or recent MRI demonstrated an suggested significant stenosis at what is labeled as L1-2 and L2-3 ORIF the transitional vertebrae is counted L2-3 and L3-4. In any event myelography confirmed the presence of the stenosis is advised regarding the need for surgical decompression.  Procedure: The patient was brought to the operating room supine on a stretcher area after the smooth induction of general endotracheal anesthesia he was carefully turned prone the bony prominences appropriately padded and protected. The back was cleansed with alcohol and DuraPrep and draped in a sterile fashion. A midline incision was created and carried down to the lumbar dorsal fascia which was opened on use out of the midline. Localizing radiographs identified the appropriate interspaces that is 3 from below identifying but would be considered L3-4 normally but because of the transitional anatomy was considered L2-3. The dissection was carried down from this area up to expose L1-2 also. An additional localizing radiograph was obtained the third radiograph was obtained after the bulk of laminectomy had been performed to assure that adequately long decompression had been performed. With this laminectomy was  undertaken removing a substantial overgrown facet and spinous process complex spinous process however was foreshortened as there've been previous surgery here care was taken to remove the laminar arch carefully and decompress the central canal then by widening the laminectomy the L ligament that was thickened and redundant and partially adherent to the dura was carefully elevated and removed. The laminectomy was widened and the decompression was taken out to the lateral recesses to decompress the path of the L3 nerve root inferiorly and the L2 nerve root superiorly the central laminectomy was then continued up beyond L1 to an area further previous surgery where there was no stenosis. Once the laminectomy was completed on one side the opposite side was widened. In the in the pads of the L1-L2 to L3 nerve roots cage be sounded easily out into the foramen. Common dural tube was amply decompressed. No spinal fluid leaks had occurred. Blood loss for this procedure was estimated at 75 mL once hemostasis was well established the lumbar dorsal fascia was closed with #1 Vicryls interrupted fashion 2 over was used in subcutaneous tissues. A gram of vancomycin powder was placed into the fascia also and 30 Vicryls used to close the subcutaneous take her skin. Patient was returned to recovery room in stable condition.

## 2015-08-19 NOTE — Anesthesia Procedure Notes (Signed)
Procedure Name: Intubation Date/Time: 08/19/2015 2:17 PM Performed by: Rejeana Brock L Pre-anesthesia Checklist: Patient identified, Timeout performed, Emergency Drugs available, Suction available and Patient being monitored Patient Re-evaluated:Patient Re-evaluated prior to inductionOxygen Delivery Method: Circle system utilized Preoxygenation: Pre-oxygenation with 100% oxygen Intubation Type: IV induction Ventilation: Mask ventilation without difficulty and Oral airway inserted - appropriate to patient size Laryngoscope Size: Mac and 4 Grade View: Grade II Tube type: Oral Tube size: 7.5 mm Number of attempts: 1 Airway Equipment and Method: Stylet Placement Confirmation: ETT inserted through vocal cords under direct vision,  breath sounds checked- equal and bilateral and positive ETCO2 Secured at: 23 cm Tube secured with: Tape Dental Injury: Teeth and Oropharynx as per pre-operative assessment

## 2015-08-20 MED ORDER — DIAZEPAM 5 MG PO TABS: 5.0000 mg | ORAL_TABLET | Freq: Four times a day (QID) | ORAL | Status: DC | PRN

## 2015-08-20 MED ORDER — CEPHALEXIN 500 MG PO CAPS: 500.0000 mg | ORAL_CAPSULE | Freq: Four times a day (QID) | ORAL | Status: DC

## 2015-08-20 MED ORDER — OXYCODONE-ACETAMINOPHEN 5-325 MG PO TABS: 1.0000 | ORAL_TABLET | ORAL | Status: DC | PRN

## 2015-08-20 MED ORDER — DEXAMETHASONE 1 MG PO TABS: ORAL_TABLET | ORAL | Status: DC

## 2015-08-20 NOTE — Evaluation (Signed)
Physical Therapy Evaluation Patient Details Name: Taylor Elliott MRN: [id number] DOB: June 28, 1951 Today's Date: 08/20/2015   History of Present Illness  patient admitted for elective Laminectomy L1-2 and L2-3 with decompression of central canal and lateral recesses including the L2 and L3 nerve roots.  PMH significant for recent removal of kidney mass, prostate cancer and lumbar surgery as child.  Per patient has had Left foot drop since child and recently has numbness/parathesias in RLE.  Patient also reports ulcers on bilateral feet due to parathesias.  Clinical Impression  Patient overall did well with mobility.  Patient is modified independent for all mobility.  Educated patient on back precautions and progression as well as limitations.  No further PT needs identified.  May benefit from [place] in future to return to active lifestyle.      Follow Up Recommendations No PT follow up    Equipment Recommendations  Kasandra Knudsen (family to pick up on way home from drug store)    Recommendations for Other Services       Precautions / Restrictions Precautions Precautions: Back Precaution Booklet Issued: Yes (comment)      Mobility  Bed Mobility Overal bed mobility: Independent                Transfers Overall transfer level: Independent Equipment used: Straight cane                Ambulation/Gait Ambulation/Gait assistance: Modified independent (Device/Increase time) Ambulation Distance (Feet): 100 Feet Assistive device: Straight cane Gait Pattern/deviations: Step-through pattern;Steppage;Antalgic Gait velocity: decreased   General Gait Details: pt did not bring AFO and thus steppage gait on left.  Patient did stagger slightly left and right, but I believe this is due to left foot drop versus balance.  Stairs Stairs: Yes Stairs assistance: Modified independent (Device/Increase time) Stair Management: One rail Left;Forwards;With cane Number of Stairs: 10 General stair  comments: wife present for stair training  Wheelchair Mobility    Modified Rankin (Stroke Patients Only)       Balance Overall balance assessment: No apparent balance deficits (not formally assessed)                                           Pertinent Vitals/Pain Pain Assessment: 0-10 Pain Score: 5  ("I have a high pain tolerance") Pain Location: back, hip Pain Descriptors / Indicators: Constant;Cramping Pain Intervention(s): Limited activity within patient's tolerance;Monitored during session    [place] expects to be discharged to:: Private residence Living Arrangements: Spouse/significant other Available Help at Discharge: Family;Available PRN/intermittently   Home Access: Stairs to enter   Entrance Stairs-Number of Steps: 3   Home Equipment: None      Prior Function Level of Independence: Independent         Comments: has had Left foot drop since child; developed numbness/parathesias in right leg prior to surgery.      Hand Dominance        Extremity/Trunk Assessment   Upper Extremity Assessment: Overall WFL for tasks assessed           Lower Extremity Assessment: RLE deficits/detail;LLE deficits/detail RLE Deficits / Details: no active ankle flexion; knee and hip at least 3/5 LLE Deficits / Details: no active ankle flex, limited knee extension, hip intact.  Cervical / Trunk Assessment: Normal  Communication   Communication: No difficulties  Cognition Arousal/Alertness: Awake/alert Behavior During Therapy: [facility]  for tasks assessed/performed Overall Cognitive Status: Within Functional Limits for tasks assessed                      General Comments      Exercises General Exercises - Lower Extremity Long Arc Quad: AROM;Both;10 reps;Seated      Assessment/Plan    PT Assessment [place] does not need any further PT services  PT Diagnosis Difficulty walking;Acute pain   PT Problem List    PT  Treatment Interventions     PT Goals (Current goals can be found in the Care Plan section) Acute Rehab PT Goals Patient Stated Goal: go home PT Goal Formulation: All assessment and education complete, DC therapy    Frequency     Barriers to discharge        Co-evaluation               End of Session   Activity Tolerance: Patient tolerated treatment well;No increased pain Patient left: in chair;with family/visitor present Nurse Communication: Mobility status         Time: 0906 [phone]) PT Time Calculation (min) (ACUTE ONLY): 13 min   Charges:   PT Evaluation $PT Eval Moderate Complexity: 1 Procedure     PT G CodesShanna Cisco 08/20/2015, 10:00 AM 08/20/2015 [place], PT [phone]

## 2015-08-20 NOTE — Discharge Summary (Signed)
Physician Discharge Summary  Patient ID: Taylor Elliott MRN: [id number] DOB/AGE: 1951-06-26 65 y.o.  Admit date: 08/19/2015 Discharge date: 08/20/2015  Admission Diagnoses: Lumbar stenosis L1 to L2-3 with neurogenic claudication and lumbar radiculopathy. History of spinal tumor resected as a child. Bilateral foot drops with chronic neuropathy.  Discharge Diagnoses: Lumbar stenosis L1 2, L2-3 with neurogenic claudication and lumbar radiculopathy. History of spinal tumor resected as a child. Bilateral foot drops with chronic neuropathy. Active Problems:   Lumbar stenosis with neurogenic claudication   Discharged Condition: good  Hospital Course: Patient was admitted to undergo surgical decompression at L1-2 and L2-3. He tolerated surgery well. Consults: None  Significant Diagnostic Studies: None  Treatments: surgery: Laminectomy L1-2 and L2-3 with decompression of central canal and lateral recesses  Discharge Exam: Blood pressure 140/67, pulse 68, temperature 98.3 F (36.8 C), temperature source Oral, resp. rate 18, height '5\' 7"'$  (1.702 m), weight 86.183 kg (190 lb), SpO2 100 %. Incision has demonstrated some bleedthrough. Dressing changes have been applied. His motor function remained stable postoperatively good proximal function with bilateral foot drops.  Disposition: 01-Home or Self Care  Discharge Instructions    Call MD for:  redness, tenderness, or signs of infection (pain, swelling, redness, odor or green/yellow discharge around incision site)    Complete by:  As directed      Call MD for:  severe uncontrolled pain    Complete by:  As directed      Call MD for:  temperature >100.4    Complete by:  As directed      Diet - low sodium heart healthy    Complete by:  As directed      Discharge instructions    Complete by:  As directed   Okay to shower. Do not apply salves or appointments to incision. No heavy lifting with the upper extremities greater than 15 pounds. May resume  driving when not requiring pain medication and patient feels comfortable with doing so.     Increase activity slowly    Complete by:  As directed             Medication List    TAKE these medications        acetaminophen 500 MG tablet  Commonly known as:  TYLENOL  Take 500 mg by mouth every 6 (six) hours as needed for mild pain.     allopurinol 100 MG tablet  Commonly known as:  ZYLOPRIM  Take 200 mg by mouth every morning.     amLODipine 10 MG tablet  Commonly known as:  NORVASC  Take 10 mg by mouth every morning.     aspirin EC 81 MG tablet  Take 81 mg by mouth daily.     calcium carbonate 500 MG chewable tablet  Commonly known as:  TUMS - dosed in mg elemental calcium  Chew 1 tablet by mouth daily. As needed     cephALEXin 500 MG capsule  Commonly known as:  KEFLEX  Take 1 capsule (500 mg total) by mouth 4 (four) times daily.     cholecalciferol 1000 units tablet  Commonly known as:  VITAMIN D  Take 2,000 Units by mouth daily.     dexamethasone 1 MG tablet  Commonly known as:  DECADRON  2 tablets twice daily for 2 days, one tablet twice daily for 2 days, one tablet daily for 2 days.     DEXILANT 60 MG capsule  Generic drug:  dexlansoprazole  Take 60 mg by  mouth daily as needed. For acid reflux     diazepam 5 MG tablet  Commonly known as:  VALIUM  Take 1 tablet (5 mg total) by mouth every 6 (six) hours as needed for muscle spasms.     diphenhydrAMINE 50 MG tablet  Commonly known as:  BENADRYL  Take 50 mg by mouth at bedtime as needed for sleep.     EPIPEN IJ  Inject as directed.     HYDROcodone-acetaminophen 5-325 MG tablet  Commonly known as:  NORCO  Take 1-2 tablets by mouth every 4 (four) hours as needed for moderate pain.     lisinopril 20 MG tablet  Commonly known as:  PRINIVIL,ZESTRIL  Take 20 mg by mouth every morning.     loperamide 2 MG capsule  Commonly known as:  IMODIUM  Take 2 mg by mouth as needed for diarrhea or loose stools.      multivitamin tablet  Take 1 tablet by mouth daily.     oxyCODONE-acetaminophen 5-325 MG tablet  Commonly known as:  PERCOCET/ROXICET  Take 1-2 tablets by mouth every 4 (four) hours as needed for moderate pain.     predniSONE 50 MG tablet  Commonly known as:  DELTASONE  Take 1 tablet once a day for 5 days     ranitidine 150 MG capsule  Commonly known as:  ZANTAC  Take 150 mg by mouth 2 (two) times daily as needed for heartburn.     rosuvastatin 10 MG tablet  Commonly known as:  CRESTOR  Take 10 mg by mouth every morning.     solifenacin 10 MG tablet  Commonly known as:  VESICARE  Take 10 mg by mouth daily.         SignedEarleen Newport 08/20/2015, 9:40 AM

## 2015-08-20 NOTE — Progress Notes (Signed)
Patient ID: Taylor Elliott, male   DOB: 1950/11/24, 65 y.o.   MRN: [id number] Vital signs are stable There is a moderate amount of bleedthrough on the dressing last night which required changing This morning he's had soaking of 1 sponge but it otherwise appears contained Incision was redressed after being cleansed with Betadine swab stick I instructed the wife on the dressing changes and use of a Betadine swab stick Explained that the sutures are underneath the skin and that there is glue on the surface I anticipate that this drainage. The next day or 2 I will send him home on Keflex Also explained Decadron taper Discharge this morning

## 2015-08-22 ENCOUNTER — Encounter (HOSPITAL_COMMUNITY): Payer: Self-pay | Admitting: Neurological Surgery

## 2015-08-22 MED FILL — VESIcare 10 MG TABS: 10 | 90 days supply | Qty: 90 | Fill #3

## 2015-08-23 ENCOUNTER — Ambulatory Visit: Payer: 59 | Admitting: Hematology & Oncology

## 2015-08-23 ENCOUNTER — Other Ambulatory Visit: Payer: 59

## 2015-08-23 NOTE — Anesthesia Postprocedure Evaluation (Signed)
Anesthesia Post Note  Patient: Taylor Elliott  Procedure(s) Performed: Procedure(s) (LRB): Lumbar One-Two/Two-Three Laminectomy (N/A)  Patient location during evaluation: PACU Anesthesia Type: General Level of consciousness: awake and alert Pain management: pain level controlled Vital Signs Assessment: post-procedure vital signs reviewed and stable Respiratory status: spontaneous breathing Cardiovascular status: blood pressure returned to baseline Anesthetic complications: no    Last Vitals:  Filed Vitals:   08/20/15 0438 08/20/15 0828  BP: 111/53 140/67  Pulse: 75 68  Temp: 36.4 C 36.8 C  Resp: 18 18    Last Pain:  Filed Vitals:   08/20/15 0847  PainSc: 3                  Tiajuana Amass

## 2015-08-26 NOTE — Progress Notes (Signed)
Utilization review completed.  

## 2015-08-31 DIAGNOSIS — T85618A Breakdown (mechanical) of other specified internal prosthetic devices, implants and grafts, initial encounter: Secondary | ICD-10-CM | POA: Diagnosis not present

## 2015-08-31 DIAGNOSIS — J320 Chronic maxillary sinusitis: Secondary | ICD-10-CM | POA: Diagnosis not present

## 2015-08-31 DIAGNOSIS — H9072 Mixed conductive and sensorineural hearing loss, unilateral, left ear, with unrestricted hearing on the contralateral side: Secondary | ICD-10-CM | POA: Diagnosis not present

## 2015-09-01 DIAGNOSIS — L97411 Non-pressure chronic ulcer of right heel and midfoot limited to breakdown of skin: Secondary | ICD-10-CM | POA: Diagnosis not present

## 2015-09-01 DIAGNOSIS — B351 Tinea unguium: Secondary | ICD-10-CM | POA: Diagnosis not present

## 2015-09-02 ENCOUNTER — Other Ambulatory Visit (HOSPITAL_COMMUNITY): Payer: Self-pay | Admitting: Urology

## 2015-09-02 DIAGNOSIS — C641 Malignant neoplasm of right kidney, except renal pelvis: Secondary | ICD-10-CM

## 2015-09-09 DIAGNOSIS — T63451D Toxic effect of venom of hornets, accidental (unintentional), subsequent encounter: Secondary | ICD-10-CM | POA: Diagnosis not present

## 2015-09-09 DIAGNOSIS — T63461D Toxic effect of venom of wasps, accidental (unintentional), subsequent encounter: Secondary | ICD-10-CM | POA: Diagnosis not present

## 2015-09-12 MED FILL — OXYCODONE/APAP 5-325: 5-325 | 15 days supply | Qty: 60 | Fill #0

## 2015-09-14 DIAGNOSIS — J324 Chronic pansinusitis: Secondary | ICD-10-CM | POA: Diagnosis not present

## 2015-09-14 DIAGNOSIS — H6983 Other specified disorders of Eustachian tube, bilateral: Secondary | ICD-10-CM | POA: Insufficient documentation

## 2015-09-15 ENCOUNTER — Other Ambulatory Visit: Payer: Self-pay | Admitting: Otolaryngology

## 2015-09-22 ENCOUNTER — Other Ambulatory Visit (HOSPITAL_COMMUNITY): Payer: Self-pay | Admitting: Otolaryngology

## 2015-09-22 DIAGNOSIS — J320 Chronic maxillary sinusitis: Secondary | ICD-10-CM

## 2015-09-26 ENCOUNTER — Ambulatory Visit (HOSPITAL_COMMUNITY)
Admission: RE | Admit: 2015-09-26 | Discharge: 2015-09-26 | Disposition: A | Discharge status: Home / Self Care | Payer: 59 | Source: Ambulatory Visit | Attending: Otolaryngology | Admitting: Otolaryngology

## 2015-09-26 ENCOUNTER — Encounter (HOSPITAL_BASED_OUTPATIENT_CLINIC_OR_DEPARTMENT_OTHER): Payer: Self-pay | Admitting: *Deleted

## 2015-09-26 DIAGNOSIS — J320 Chronic maxillary sinusitis: Secondary | ICD-10-CM | POA: Diagnosis not present

## 2015-09-26 DIAGNOSIS — J329 Chronic sinusitis, unspecified: Secondary | ICD-10-CM | POA: Diagnosis not present

## 2015-09-27 ENCOUNTER — Other Ambulatory Visit (HOSPITAL_COMMUNITY): Payer: Self-pay | Admitting: Otolaryngology

## 2015-09-28 ENCOUNTER — Ambulatory Visit (HOSPITAL_COMMUNITY)
Admission: RE | Admit: 2015-09-28 | Discharge: 2015-09-28 | Disposition: A | Discharge status: Home / Self Care | Payer: 59 | Source: Ambulatory Visit | Attending: Urology | Admitting: Urology

## 2015-09-28 DIAGNOSIS — K769 Liver disease, unspecified: Secondary | ICD-10-CM | POA: Diagnosis not present

## 2015-09-28 DIAGNOSIS — Z905 Acquired absence of kidney: Secondary | ICD-10-CM | POA: Insufficient documentation

## 2015-09-28 DIAGNOSIS — R160 Hepatomegaly, not elsewhere classified: Secondary | ICD-10-CM | POA: Diagnosis not present

## 2015-09-28 DIAGNOSIS — Z9889 Other specified postprocedural states: Secondary | ICD-10-CM | POA: Insufficient documentation

## 2015-09-28 DIAGNOSIS — C641 Malignant neoplasm of right kidney, except renal pelvis: Secondary | ICD-10-CM

## 2015-09-28 DIAGNOSIS — K429 Umbilical hernia without obstruction or gangrene: Secondary | ICD-10-CM | POA: Insufficient documentation

## 2015-09-28 DIAGNOSIS — K760 Fatty (change of) liver, not elsewhere classified: Secondary | ICD-10-CM | POA: Insufficient documentation

## 2015-09-28 DIAGNOSIS — R918 Other nonspecific abnormal finding of lung field: Secondary | ICD-10-CM | POA: Diagnosis not present

## 2015-09-28 MED ORDER — GADOBENATE DIMEGLUMINE 529 MG/ML IV SOLN
20.0000 mL | Freq: Once | INTRAVENOUS | Status: AC | PRN
  Administered 2015-09-28: 19 mL via INTRAVENOUS

## 2015-09-30 ENCOUNTER — Ambulatory Visit (HOSPITAL_BASED_OUTPATIENT_CLINIC_OR_DEPARTMENT_OTHER): Payer: 59 | Admitting: Anesthesiology

## 2015-09-30 ENCOUNTER — Encounter (HOSPITAL_BASED_OUTPATIENT_CLINIC_OR_DEPARTMENT_OTHER)
Admission: RE | Disposition: A | Discharge status: Home / Self Care | Payer: Self-pay | Source: Ambulatory Visit | Attending: Otolaryngology

## 2015-09-30 ENCOUNTER — Ambulatory Visit (HOSPITAL_BASED_OUTPATIENT_CLINIC_OR_DEPARTMENT_OTHER)
Admission: RE | Admit: 2015-09-30 | Discharge: 2015-09-30 | Disposition: A | Discharge status: Home / Self Care | Payer: 59 | Source: Ambulatory Visit | Attending: Otolaryngology | Admitting: Otolaryngology

## 2015-09-30 ENCOUNTER — Encounter (HOSPITAL_BASED_OUTPATIENT_CLINIC_OR_DEPARTMENT_OTHER): Payer: Self-pay | Admitting: Anesthesiology

## 2015-09-30 DIAGNOSIS — H6982 Other specified disorders of Eustachian tube, left ear: Secondary | ICD-10-CM | POA: Diagnosis not present

## 2015-09-30 DIAGNOSIS — J320 Chronic maxillary sinusitis: Secondary | ICD-10-CM | POA: Diagnosis not present

## 2015-09-30 DIAGNOSIS — N189 Chronic kidney disease, unspecified: Secondary | ICD-10-CM | POA: Diagnosis not present

## 2015-09-30 DIAGNOSIS — Z7982 Long term (current) use of aspirin: Secondary | ICD-10-CM | POA: Diagnosis not present

## 2015-09-30 DIAGNOSIS — M1990 Unspecified osteoarthritis, unspecified site: Secondary | ICD-10-CM | POA: Insufficient documentation

## 2015-09-30 DIAGNOSIS — H6690 Otitis media, unspecified, unspecified ear: Secondary | ICD-10-CM | POA: Diagnosis not present

## 2015-09-30 DIAGNOSIS — I129 Hypertensive chronic kidney disease with stage 1 through stage 4 chronic kidney disease, or unspecified chronic kidney disease: Secondary | ICD-10-CM | POA: Insufficient documentation

## 2015-09-30 DIAGNOSIS — K219 Gastro-esophageal reflux disease without esophagitis: Secondary | ICD-10-CM | POA: Diagnosis not present

## 2015-09-30 DIAGNOSIS — J329 Chronic sinusitis, unspecified: Secondary | ICD-10-CM | POA: Insufficient documentation

## 2015-09-30 DIAGNOSIS — J322 Chronic ethmoidal sinusitis: Secondary | ICD-10-CM | POA: Diagnosis not present

## 2015-09-30 DIAGNOSIS — Z85528 Personal history of other malignant neoplasm of kidney: Secondary | ICD-10-CM | POA: Diagnosis not present

## 2015-09-30 DIAGNOSIS — Z79899 Other long term (current) drug therapy: Secondary | ICD-10-CM | POA: Insufficient documentation

## 2015-09-30 DIAGNOSIS — Z87891 Personal history of nicotine dependence: Secondary | ICD-10-CM | POA: Diagnosis not present

## 2015-09-30 DIAGNOSIS — E7800 Pure hypercholesterolemia, unspecified: Secondary | ICD-10-CM | POA: Insufficient documentation

## 2015-09-30 DIAGNOSIS — H6980 Other specified disorders of Eustachian tube, unspecified ear: Secondary | ICD-10-CM | POA: Diagnosis not present

## 2015-09-30 HISTORY — PX: SINUS ENDO WITH FUSION: SHX5329

## 2015-09-30 HISTORY — PX: MYRINGOTOMY WITH TUBE PLACEMENT: SHX5663

## 2015-09-30 SURGERY — MYRINGOTOMY WITH TUBE PLACEMENT
Anesthesia: General | Site: Nose | Laterality: Left

## 2015-09-30 MED ORDER — CEPHALEXIN 500 MG PO CAPS
500.0000 mg | ORAL_CAPSULE | Freq: Three times a day (TID) | ORAL | Status: DC
Start: 2015-09-30 — End: 2015-09-30

## 2015-09-30 MED ORDER — PHENYLEPHRINE HCL 10 MG/ML IJ SOLN
INTRAMUSCULAR | Status: DC | PRN
  Administered 2015-09-30: 80 ug via INTRAVENOUS
  Administered 2015-09-30: 120 ug via INTRAVENOUS

## 2015-09-30 MED ORDER — MUPIROCIN 2 % EX OINT
TOPICAL_OINTMENT | CUTANEOUS | Status: AC
  Filled 2015-09-30: qty 22

## 2015-09-30 MED ORDER — PROPOFOL 10 MG/ML IV BOLUS
INTRAVENOUS | Status: DC | PRN
  Administered 2015-09-30: 200 mg via INTRAVENOUS

## 2015-09-30 MED ORDER — DEXAMETHASONE SODIUM PHOSPHATE 10 MG/ML IJ SOLN
INTRAMUSCULAR | Status: AC
  Filled 2015-09-30: qty 1

## 2015-09-30 MED ORDER — MEPERIDINE HCL 25 MG/ML IJ SOLN: 6.2500 mg | INTRAMUSCULAR | Status: DC | PRN

## 2015-09-30 MED ORDER — SUGAMMADEX SODIUM 200 MG/2ML IV SOLN
INTRAVENOUS | Status: DC | PRN
  Administered 2015-09-30: 200 mg via INTRAVENOUS

## 2015-09-30 MED ORDER — HYDROMORPHONE HCL 1 MG/ML IJ SOLN
INTRAMUSCULAR | Status: AC
  Filled 2015-09-30: qty 1

## 2015-09-30 MED ORDER — FENTANYL CITRATE (PF) 100 MCG/2ML IJ SOLN
INTRAMUSCULAR | Status: AC
  Filled 2015-09-30: qty 2

## 2015-09-30 MED ORDER — LACTATED RINGERS IV SOLN
INTRAVENOUS | Status: DC
  Administered 2015-09-30 (×3): via INTRAVENOUS

## 2015-09-30 MED ORDER — FENTANYL CITRATE (PF) 100 MCG/2ML IJ SOLN
50.0000 ug | INTRAMUSCULAR | Status: DC | PRN
  Administered 2015-09-30 (×2): 50 ug via INTRAVENOUS
  Administered 2015-09-30: 100 ug via INTRAVENOUS

## 2015-09-30 MED ORDER — OXYCODONE-ACETAMINOPHEN 5-325 MG PO TABS
1.0000 | ORAL_TABLET | Freq: Once | ORAL | Status: AC | PRN
  Administered 2015-09-30: 1 via ORAL

## 2015-09-30 MED ORDER — ROCURONIUM BROMIDE 50 MG/5ML IV SOLN
INTRAVENOUS | Status: AC
  Filled 2015-09-30: qty 1

## 2015-09-30 MED ORDER — PROPOFOL 10 MG/ML IV BOLUS
INTRAVENOUS | Status: AC
  Filled 2015-09-30: qty 20

## 2015-09-30 MED ORDER — ONDANSETRON HCL 4 MG/2ML IJ SOLN: 4.0000 mg | Freq: Once | INTRAMUSCULAR | Status: DC | PRN

## 2015-09-30 MED ORDER — CIPROFLOXACIN-DEXAMETHASONE 0.3-0.1 % OT SUSP
OTIC | Status: DC | PRN
  Administered 2015-09-30: 4 [drp] via OTIC

## 2015-09-30 MED ORDER — ONDANSETRON HCL 4 MG/2ML IJ SOLN
INTRAMUSCULAR | Status: AC
  Filled 2015-09-30: qty 2

## 2015-09-30 MED ORDER — SUCCINYLCHOLINE CHLORIDE 20 MG/ML IJ SOLN
INTRAMUSCULAR | Status: AC
  Filled 2015-09-30: qty 1

## 2015-09-30 MED ORDER — GLYCOPYRROLATE 0.2 MG/ML IJ SOLN: 0.2000 mg | Freq: Once | INTRAMUSCULAR | Status: DC | PRN

## 2015-09-30 MED ORDER — PROPOFOL 10 MG/ML IV BOLUS
INTRAVENOUS | Status: AC
Start: 2015-09-30 — End: 2015-09-30
  Filled 2015-09-30: qty 40

## 2015-09-30 MED ORDER — SUGAMMADEX SODIUM 200 MG/2ML IV SOLN
INTRAVENOUS | Status: AC
  Filled 2015-09-30: qty 2

## 2015-09-30 MED ORDER — LIDOCAINE-EPINEPHRINE 1 %-1:100000 IJ SOLN
INTRAMUSCULAR | Status: DC | PRN
  Administered 2015-09-30: 3 mL

## 2015-09-30 MED ORDER — EPHEDRINE SULFATE 50 MG/ML IJ SOLN
INTRAMUSCULAR | Status: DC | PRN
  Administered 2015-09-30: 15 mg via INTRAVENOUS
  Administered 2015-09-30: 10 mg via INTRAVENOUS

## 2015-09-30 MED ORDER — LIDOCAINE HCL (CARDIAC) 20 MG/ML IV SOLN
INTRAVENOUS | Status: AC
  Filled 2015-09-30: qty 5

## 2015-09-30 MED ORDER — OXYCODONE-ACETAMINOPHEN 5-325 MG PO TABS
ORAL_TABLET | ORAL | Status: AC
  Filled 2015-09-30: qty 1

## 2015-09-30 MED ORDER — OXYMETAZOLINE HCL 0.05 % NA SOLN
NASAL | Status: AC
  Filled 2015-09-30: qty 15

## 2015-09-30 MED ORDER — CIPROFLOXACIN-DEXAMETHASONE 0.3-0.1 % OT SUSP
OTIC | Status: AC
  Filled 2015-09-30: qty 7.5

## 2015-09-30 MED ORDER — MUPIROCIN 2 % EX OINT
TOPICAL_OINTMENT | CUTANEOUS | Status: DC | PRN
  Administered 2015-09-30: 1 via TOPICAL

## 2015-09-30 MED ORDER — BACITRACIN ZINC 500 UNIT/GM EX OINT
TOPICAL_OINTMENT | CUTANEOUS | Status: AC
  Filled 2015-09-30: qty 28.35

## 2015-09-30 MED ORDER — MIDAZOLAM HCL 2 MG/2ML IJ SOLN
INTRAMUSCULAR | Status: AC
  Filled 2015-09-30: qty 2

## 2015-09-30 MED ORDER — SODIUM CHLORIDE 0.9 % IV SOLN
INTRAVENOUS | Status: DC | PRN
  Administered 2015-09-30: 100 mL

## 2015-09-30 MED ORDER — LIDOCAINE HCL (CARDIAC) 20 MG/ML IV SOLN
INTRAVENOUS | Status: DC | PRN
  Administered 2015-09-30: 100 mg via INTRAVENOUS

## 2015-09-30 MED ORDER — ONDANSETRON HCL 4 MG/2ML IJ SOLN
INTRAMUSCULAR | Status: DC | PRN
  Administered 2015-09-30: 4 mg via INTRAVENOUS

## 2015-09-30 MED ORDER — PHENYLEPHRINE 40 MCG/ML (10ML) SYRINGE FOR IV PUSH (FOR BLOOD PRESSURE SUPPORT)
PREFILLED_SYRINGE | INTRAVENOUS | Status: AC
  Filled 2015-09-30: qty 20

## 2015-09-30 MED ORDER — HYDROMORPHONE HCL 1 MG/ML IJ SOLN
0.2500 mg | INTRAMUSCULAR | Status: DC | PRN
  Administered 2015-09-30 (×4): 0.5 mg via INTRAVENOUS

## 2015-09-30 MED ORDER — HYDROMORPHONE HCL 1 MG/ML IJ SOLN
0.5000 mg | INTRAMUSCULAR | Status: AC | PRN
  Administered 2015-09-30 (×2): 0.5 mg via INTRAVENOUS

## 2015-09-30 MED ORDER — MIDAZOLAM HCL 2 MG/2ML IJ SOLN: 1.0000 mg | INTRAMUSCULAR | Status: DC | PRN

## 2015-09-30 MED ORDER — OXYMETAZOLINE HCL 0.05 % NA SOLN
NASAL | Status: DC | PRN
  Administered 2015-09-30: 1 via TOPICAL

## 2015-09-30 MED ORDER — SCOPOLAMINE 1 MG/3DAYS TD PT72: 1.0000 | MEDICATED_PATCH | Freq: Once | TRANSDERMAL | Status: DC | PRN

## 2015-09-30 MED ORDER — ROCURONIUM BROMIDE 100 MG/10ML IV SOLN
INTRAVENOUS | Status: DC | PRN
  Administered 2015-09-30: 50 mg via INTRAVENOUS

## 2015-09-30 MED ORDER — LACTATED RINGERS IV SOLN
INTRAVENOUS | Status: DC
  Administered 2015-09-30: 14:00:00 via INTRAVENOUS

## 2015-09-30 MED ORDER — DEXAMETHASONE SODIUM PHOSPHATE 4 MG/ML IJ SOLN
INTRAMUSCULAR | Status: DC | PRN
  Administered 2015-09-30: 10 mg via INTRAVENOUS

## 2015-09-30 MED ORDER — LIDOCAINE-EPINEPHRINE 1 %-1:100000 IJ SOLN
INTRAMUSCULAR | Status: AC
  Filled 2015-09-30: qty 1

## 2015-09-30 MED FILL — CEPHALEXIN 500 MG CAPSULE: 500 | 5 days supply | Qty: 15 | Fill #0

## 2015-09-30 SURGICAL SUPPLY — 55 items
BLADE RAD40 ROTATE 4M 4 5PK (BLADE) IMPLANT
BLADE RAD60 ROTATE M4 4 5PK (BLADE) IMPLANT
BLADE ROTATE RAD 12 4 M4 (BLADE) IMPLANT
BLADE ROTATE RAD 40 4 M4 (BLADE) IMPLANT
BLADE ROTATE TRICUT 4X13 M4 (BLADE) ×3 IMPLANT
BLADE TRICUT ROTATE M4 4 5PK (BLADE) IMPLANT
BUR HS RAD FRONTAL 3 (BURR) IMPLANT
CANISTER SUC SOCK COL 7IN (MISCELLANEOUS) ×6 IMPLANT
CANISTER SUCT 1200ML W/VALVE (MISCELLANEOUS) ×3 IMPLANT
COAGULATOR SUCT SWTCH 10FR 6 (ELECTROSURGICAL) IMPLANT
COTTONBALL LRG STERILE PKG (GAUZE/BANDAGES/DRESSINGS) IMPLANT
DECANTER SPIKE VIAL GLASS SM (MISCELLANEOUS) ×3 IMPLANT
DRAPE SURG 17X23 STRL (DRAPES) IMPLANT
DROPPER MEDICINE STER 1.5ML LF (MISCELLANEOUS) IMPLANT
DRSG NASOPORE 8CM (GAUZE/BANDAGES/DRESSINGS) ×3 IMPLANT
DRSG TELFA 3X8 NADH (GAUZE/BANDAGES/DRESSINGS) IMPLANT
ELECT COATED BLADE 2.86 ST (ELECTRODE) IMPLANT
ELECT REM PT RETURN 9FT ADLT (ELECTROSURGICAL) ×3
ELECTRODE REM PT RTRN 9FT ADLT (ELECTROSURGICAL) ×2 IMPLANT
GLOVE BIOGEL PI IND STRL 7.5 (GLOVE) ×2 IMPLANT
GLOVE BIOGEL PI INDICATOR 7.5 (GLOVE) ×1
GLOVE EXAM NITRILE MD LF STRL (GLOVE) ×3 IMPLANT
GLOVE SS BIOGEL STRL SZ 7.5 (GLOVE) ×2 IMPLANT
GLOVE SUPERSENSE BIOGEL SZ 7.5 (GLOVE) ×1
GLOVE SURG SS PI 7.0 STRL IVOR (GLOVE) ×6 IMPLANT
GOWN STRL REUS W/ TWL LRG LVL3 (GOWN DISPOSABLE) ×4 IMPLANT
GOWN STRL REUS W/ TWL XL LVL3 (GOWN DISPOSABLE) ×2 IMPLANT
GOWN STRL REUS W/TWL LRG LVL3 (GOWN DISPOSABLE) ×2
GOWN STRL REUS W/TWL XL LVL3 (GOWN DISPOSABLE) ×1
IV NS 1000ML (IV SOLUTION)
IV NS 1000ML BAXH (IV SOLUTION) IMPLANT
IV NS 500ML (IV SOLUTION) ×1
IV NS 500ML BAXH (IV SOLUTION) ×2 IMPLANT
NEEDLE PRECISIONGLIDE 27X1.5 (NEEDLE) ×3 IMPLANT
NEEDLE SPNL 25GX3.5 QUINCKE BL (NEEDLE) IMPLANT
NS IRRIG 1000ML POUR BTL (IV SOLUTION) IMPLANT
PACK BASIN DAY SURGERY FS (CUSTOM PROCEDURE TRAY) ×3 IMPLANT
PACK ENT DAY SURGERY (CUSTOM PROCEDURE TRAY) ×3 IMPLANT
PACKING NASAL EPISTAXIS 4X2.4 (MISCELLANEOUS) ×3 IMPLANT
PATTIES SURGICAL .5 X3 (DISPOSABLE) ×3 IMPLANT
PENCIL FOOT CONTROL (ELECTRODE) IMPLANT
SOLUTION ANTI FOG 6CC (MISCELLANEOUS) ×3 IMPLANT
SPONGE GAUZE 2X2 8PLY STRL LF (GAUZE/BANDAGES/DRESSINGS) ×3 IMPLANT
SPONGE SURGIFOAM ABS GEL 12-7 (HEMOSTASIS) IMPLANT
SUT CHROMIC 3 0 PS 2 (SUTURE) IMPLANT
SUT ETHILON 3 0 PS 1 (SUTURE) IMPLANT
TOWEL OR 17X24 6PK STRL BLUE (TOWEL DISPOSABLE) ×6 IMPLANT
TRACKER ENT INSTRUMENT (MISCELLANEOUS) ×3 IMPLANT
TRACKER ENT PATIENT (MISCELLANEOUS) ×3 IMPLANT
TRAY DSU PREP LF (CUSTOM PROCEDURE TRAY) ×3 IMPLANT
TUBE CONNECTING 20X1/4 (TUBING) ×3 IMPLANT
TUBE EAR SHEEHY BUTTON 1.27 (OTOLOGIC RELATED) ×3 IMPLANT
TUBE EAR T MOD 1.32X4.8 BL (OTOLOGIC RELATED) IMPLANT
TUBING STRAIGHTSHOT EPS 5PK (TUBING) ×3 IMPLANT
YANKAUER SUCT BULB TIP NO VENT (SUCTIONS) ×3 IMPLANT

## 2015-09-30 NOTE — Anesthesia Procedure Notes (Signed)
Procedure Name: Intubation Date/Time: 09/30/2015 10:51 AM Performed by: Wanita Chamberlain Pre-anesthesia Checklist: Patient identified, Timeout performed, Emergency Drugs available, Suction available and Patient being monitored Patient Re-evaluated:Patient Re-evaluated prior to inductionOxygen Delivery Method: Circle system utilized Preoxygenation: Pre-oxygenation with 100% oxygen Intubation Type: IV induction Ventilation: Mask ventilation without difficulty Laryngoscope Size: Mac and 3 Grade View: Grade II Tube type: Oral Rae Tube size: 8.0 mm Number of attempts: 1 Airway Equipment and Method: Stylet Placement Confirmation: positive ETCO2,  ETT inserted through vocal cords under direct vision and breath sounds checked- equal and bilateral Secured at: 21 cm Tube secured with: Tape Dental Injury: Teeth and Oropharynx as per pre-operative assessment

## 2015-09-30 NOTE — Anesthesia Postprocedure Evaluation (Signed)
Anesthesia Post Note  Patient: Taylor Elliott  Procedure(s) Performed: Procedure(s) (LRB): MYRINGOTOMY WITH TUBE PLACEMENT LEFT (Left) ENDOSCOPIC SINUS SURGERY WITH FUSION  (Bilateral)  Patient location during evaluation: PACU Anesthesia Type: General Level of consciousness: awake and alert Pain management: pain level controlled Vital Signs Assessment: post-procedure vital signs reviewed and stable Respiratory status: spontaneous breathing, nonlabored ventilation, respiratory function stable and patient connected to nasal cannula oxygen Cardiovascular status: blood pressure returned to baseline and stable Postop Assessment: no signs of nausea or vomiting Anesthetic complications: no    Last Vitals:  Filed Vitals:   09/30/15 1230 09/30/15 1245  BP: 132/76 125/91  Pulse: 83 85  Temp:    Resp: 17 18    Last Pain:  Filed Vitals:   09/30/15 1247  PainSc: 5                  Emilio Baylock DAVID

## 2015-09-30 NOTE — Op Note (Signed)
Preop/postop diagnosis: Chronic sinusitis and eustachian tube dysfunction Procedure: Bilateral maxillary antrostomy, bilateral anterior ethmoidectomy, fusion computer guidance, and left tympanostomy tube Anesthesia: Gen. Estimated blood loss: Less than 25 mL Indications: 65 year old with a chronic history of sinusitis that's been refractory to medical therapy. He's also had persistent eustachian tube dysfunction and a tube that's occluded with middle ear effusion. Informed risks and benefits of the procedure and options were discussed all questions are answered and consent was obtained. Operation: Patient was taken to the operating room placed in the supine position after general endotracheal tube anesthesia was placed in the right gaze position. The left ear was examined and cerumen removed. The tympanostomy tube was removed from the anterior inferior quadrant. There was thick mucoid effusion suctioned. A Sheehy tube was placed without difficulty. Ciprodex was instilled. There was no evidence of cholesteatoma. The patient was then prepped and draped in the usual sterile manner. The oxymetazoline pledgets were placed into the nose bilaterally. The fusion computer guidance system was positioned calibrated with excellent accuracy. The nose with the inferior and middle turbinates was injected with 1% lidocaine with 1 100,000 epinephrine. The left side was begun using the microdebrider with fusion guidance the uncinate process was removed. The antrostomy was opened on the left side with extremely thick mucus suctioned out of the sinus. It required the largest suctioned to remove any came out in string-like fashion. The lining looked clean with no lesions or masses. The anterior ethmoid was then opened with the microdebrider moving the uncinate process up to its attachment and then opening the bulla and anterior ethmoid. There was some thickened tissue but no masses or lesions. Right side is repeated in a similar  fashion but the antrostomy was opened uncinate removed an anterior ethmoid opened all of with has slight thickened tissue. The maxillary sinus did not have any material within it. The pledgets were placed in the ethmoids bilaterally and then the nasal pore was placed into the nose bilaterally. The nasopharynx suctioned out of all blood and debris. The patient was then awakened brought to recovery room in stable condition counts correct.

## 2015-09-30 NOTE — Anesthesia Preprocedure Evaluation (Addendum)
Anesthesia Evaluation  Patient identified by MRN, date of birth, ID band Patient awake    Reviewed: Allergy & Precautions, NPO status , Patient's Chart, lab work & pertinent test results  Airway Mallampati: I  TM Distance: >3 FB Neck ROM: Full    Dental  (+) Teeth Intact, Dental Advisory Given, Chipped,    Pulmonary former smoker,    Pulmonary exam normal        Cardiovascular hypertension, Pt. on medications Normal cardiovascular exam Rhythm:Regular Rate:Normal     Neuro/Psych Left foot drop    GI/Hepatic GERD  Medicated and Controlled,  Endo/Other    Renal/GU Renal diseaseS/P nephrectomy for renal cell CA Cr 1.4     Musculoskeletal  (+) Arthritis , Osteoarthritis,    Abdominal   Peds  Hematology   Anesthesia Other Findings   Reproductive/Obstetrics                          Anesthesia Physical Anesthesia Plan  ASA: II  Anesthesia Plan: General   Post-op Pain Management:    Induction: Intravenous  Airway Management Planned: Oral ETT  Additional Equipment:   Intra-op Plan:   Post-operative Plan: Extubation in OR  Informed Consent: I have reviewed the patients History and Physical, chart, labs and discussed the procedure including the risks, benefits and alternatives for the proposed anesthesia with the patient or authorized representative who has indicated his/her understanding and acceptance.     Plan Discussed with: CRNA and Surgeon  Anesthesia Plan Comments:         Anesthesia Quick Evaluation
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
## 2015-09-30 NOTE — Transfer of Care (Signed)
Immediate Anesthesia Transfer of Care Note  Patient: Taylor Elliott  Procedure(s) Performed: Procedure(s): MYRINGOTOMY WITH TUBE PLACEMENT LEFT (Left) ENDOSCOPIC SINUS SURGERY WITH FUSION  (Bilateral)  Patient Location: PACU  Anesthesia Type:General  Level of Consciousness: awake, alert , oriented and patient cooperative  Airway & Oxygen Therapy: Patient Spontanous Breathing and Patient connected to face mask oxygen  Post-op Assessment: Report given to RN and Post -op Vital signs reviewed and stable  Post vital signs: Reviewed and stable  Last Vitals:  Filed Vitals:   09/30/15 0906  BP: 126/62  Pulse: 83  Temp: 36.7 C  Resp: 18    Complications: No apparent anesthesia complications

## 2015-09-30 NOTE — Discharge Instructions (Signed)

## 2015-10-03 ENCOUNTER — Encounter (HOSPITAL_BASED_OUTPATIENT_CLINIC_OR_DEPARTMENT_OTHER): Payer: Self-pay | Admitting: Otolaryngology

## 2015-10-05 DIAGNOSIS — C641 Malignant neoplasm of right kidney, except renal pelvis: Secondary | ICD-10-CM | POA: Diagnosis not present

## 2015-10-05 DIAGNOSIS — N5201 Erectile dysfunction due to arterial insufficiency: Secondary | ICD-10-CM | POA: Diagnosis not present

## 2015-10-05 DIAGNOSIS — N3946 Mixed incontinence: Secondary | ICD-10-CM | POA: Diagnosis not present

## 2015-10-05 DIAGNOSIS — C61 Malignant neoplasm of prostate: Secondary | ICD-10-CM | POA: Diagnosis not present

## 2015-10-05 DIAGNOSIS — Z0000 Encounter for general adult medical examination without abnormal findings: Secondary | ICD-10-CM | POA: Diagnosis not present

## 2015-10-05 DIAGNOSIS — R160 Hepatomegaly, not elsewhere classified: Secondary | ICD-10-CM | POA: Diagnosis not present

## 2015-10-05 MED FILL — ROSUVASTATIN CALCIUM 10 MG: 10 | 90 days supply | Qty: 90 | Fill #3

## 2015-10-05 MED FILL — AMLODIPINE BESYLATE 10 MG T: 10 | 90 days supply | Qty: 90 | Fill #0 | Status: TO

## 2015-10-05 MED FILL — LISINOPRIL 20 MG TABLET: 20 | 90 days supply | Qty: 90 | Fill #3

## 2015-10-12 DIAGNOSIS — Z6830 Body mass index (BMI) 30.0-30.9, adult: Secondary | ICD-10-CM | POA: Diagnosis not present

## 2015-10-12 DIAGNOSIS — M4806 Spinal stenosis, lumbar region: Secondary | ICD-10-CM | POA: Diagnosis not present

## 2015-10-12 DIAGNOSIS — I10 Essential (primary) hypertension: Secondary | ICD-10-CM | POA: Diagnosis not present

## 2015-10-13 DIAGNOSIS — L97411 Non-pressure chronic ulcer of right heel and midfoot limited to breakdown of skin: Secondary | ICD-10-CM | POA: Diagnosis not present

## 2015-10-13 DIAGNOSIS — T63461D Toxic effect of venom of wasps, accidental (unintentional), subsequent encounter: Secondary | ICD-10-CM | POA: Diagnosis not present

## 2015-10-13 DIAGNOSIS — B351 Tinea unguium: Secondary | ICD-10-CM | POA: Diagnosis not present

## 2015-10-13 DIAGNOSIS — T63441D Toxic effect of venom of bees, accidental (unintentional), subsequent encounter: Secondary | ICD-10-CM | POA: Diagnosis not present

## 2015-10-13 DIAGNOSIS — T63451D Toxic effect of venom of hornets, accidental (unintentional), subsequent encounter: Secondary | ICD-10-CM | POA: Diagnosis not present

## 2015-10-31 MED FILL — DEXILANT DR 60 MG CAPSULE: 60 | 90 days supply | Qty: 90 | Fill #1

## 2015-11-10 ENCOUNTER — Telehealth: Payer: Self-pay | Admitting: Family Medicine

## 2015-11-10 NOTE — Telephone Encounter (Signed)
Caller name: Elie Leppo  Relation to pt: spouse  Call back [phone]   Reason for call:  Taylor Elliott, Taylor Elliott [phone] patient of yours would like to refer her spouse to Dr. Etter Sjogren to establish care as a new patient please advise

## 2015-11-10 NOTE — Telephone Encounter (Signed)
Patient will schedule appointment when she comes into the office for her follow up appointment on 11/15/15

## 2015-11-10 NOTE — Telephone Encounter (Signed)
I told her that was fine

## 2015-11-18 ENCOUNTER — Encounter: Payer: Self-pay | Admitting: Hematology & Oncology

## 2015-11-21 MED FILL — VESIcare 10 MG TABS: 10 | 90 days supply | Qty: 90 | Fill #0

## 2015-11-24 ENCOUNTER — Ambulatory Visit: Payer: 59 | Attending: Neurological Surgery | Admitting: Physical Therapy

## 2015-11-24 ENCOUNTER — Encounter: Payer: Self-pay | Admitting: Physical Therapy

## 2015-11-24 DIAGNOSIS — R262 Difficulty in walking, not elsewhere classified: Secondary | ICD-10-CM | POA: Insufficient documentation

## 2015-11-24 DIAGNOSIS — B351 Tinea unguium: Secondary | ICD-10-CM | POA: Diagnosis not present

## 2015-11-24 DIAGNOSIS — L97411 Non-pressure chronic ulcer of right heel and midfoot limited to breakdown of skin: Secondary | ICD-10-CM | POA: Diagnosis not present
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
## 2015-11-29 ENCOUNTER — Encounter: Payer: Self-pay | Admitting: Physical Therapy

## 2015-11-29 ENCOUNTER — Ambulatory Visit: Payer: 59 | Attending: Neurological Surgery | Admitting: Physical Therapy

## 2015-11-29 DIAGNOSIS — R262 Difficulty in walking, not elsewhere classified: Secondary | ICD-10-CM | POA: Diagnosis not present
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
## 2015-11-30 ENCOUNTER — Ambulatory Visit: Payer: 59 | Admitting: Physical Therapy

## 2015-11-30 ENCOUNTER — Encounter: Payer: Self-pay | Admitting: Physical Therapy

## 2015-11-30 DIAGNOSIS — R262 Difficulty in walking, not elsewhere classified: Secondary | ICD-10-CM | POA: Diagnosis not present

## 2015-11-30 DIAGNOSIS — T63461D Toxic effect of venom of wasps, accidental (unintentional), subsequent encounter: Secondary | ICD-10-CM | POA: Diagnosis not present

## 2015-11-30 DIAGNOSIS — T63441D Toxic effect of venom of bees, accidental (unintentional), subsequent encounter: Secondary | ICD-10-CM | POA: Diagnosis not present

## 2015-11-30 DIAGNOSIS — T63451D Toxic effect of venom of hornets, accidental (unintentional), subsequent encounter: Secondary | ICD-10-CM | POA: Diagnosis not present

## 2015-11-30 MED FILL — EPINEPHRINE 0.3 MG AUTO-INJ: 0.3 | 31 days supply | Qty: 4 | Fill #0
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
## 2015-12-01 ENCOUNTER — Ambulatory Visit (INDEPENDENT_AMBULATORY_CARE_PROVIDER_SITE_OTHER): Payer: 59 | Admitting: Family Medicine

## 2015-12-01 ENCOUNTER — Encounter: Payer: Self-pay | Admitting: Family Medicine

## 2015-12-01 VITALS — BP 110/68 | HR 95 | Temp 98.8°F | Ht 68.0 in | Wt 201.0 lb

## 2015-12-01 DIAGNOSIS — I10 Essential (primary) hypertension: Secondary | ICD-10-CM | POA: Diagnosis not present

## 2015-12-01 DIAGNOSIS — E785 Hyperlipidemia, unspecified: Secondary | ICD-10-CM

## 2015-12-01 DIAGNOSIS — F411 Generalized anxiety disorder: Secondary | ICD-10-CM | POA: Diagnosis not present

## 2015-12-01 MED ORDER — SERTRALINE HCL 50 MG PO TABS: 50.0000 mg | ORAL_TABLET | Freq: Every day | ORAL | Status: DC

## 2015-12-01 MED FILL — SERTRALINE HCL 50 MG TABLET: 50 | 30 days supply | Qty: 30 | Fill #0

## 2015-12-01 NOTE — Progress Notes (Signed)
Pre visit review using our clinic review tool, if applicable. No additional management support is needed unless otherwise documented below in the visit note. 

## 2015-12-01 NOTE — Patient Instructions (Signed)
Generalized Anxiety Disorder Generalized anxiety disorder (GAD) is a mental disorder. It interferes with life functions, including relationships, work, and school. GAD is different from normal anxiety, which everyone experiences at some point in their lives in response to specific life events and activities. Normal anxiety actually helps us prepare for and get through these life events and activities. Normal anxiety goes away after the event or activity is over.  GAD causes anxiety that is not necessarily related to specific events or activities. It also causes excess anxiety in proportion to specific events or activities. The anxiety associated with GAD is also difficult to control. GAD can vary from mild to severe. People with severe GAD can have intense waves of anxiety with physical symptoms (panic attacks).  SYMPTOMS The anxiety and worry associated with GAD are difficult to control. This anxiety and worry are related to many life events and activities and also occur more days than not for 6 months or longer. People with GAD also have three or more of the following symptoms (one or more in children):  Restlessness.   Fatigue.  Difficulty concentrating.   Irritability.  Muscle tension.  Difficulty sleeping or unsatisfying sleep. DIAGNOSIS GAD is diagnosed through an assessment by your health care provider. Your health care provider will ask you questions aboutyour mood,physical symptoms, and events in your life. Your health care provider may ask you about your medical history and use of alcohol or drugs, including prescription medicines. Your health care provider may also do a physical exam and blood tests. Certain medical conditions and the use of certain substances can cause symptoms similar to those associated with GAD. Your health care provider may refer you to a mental health specialist for further evaluation. TREATMENT The following therapies are usually used to treat GAD:    Medication. Antidepressant medication usually is prescribed for long-term daily control. Antianxiety medicines may be added in severe cases, especially when panic attacks occur.   Talk therapy (psychotherapy). Certain types of talk therapy can be helpful in treating GAD by providing support, education, and guidance. A form of talk therapy called cognitive behavioral therapy can teach you healthy ways to think about and react to daily life events and activities.  Stress managementtechniques. These include yoga, meditation, and exercise and can be very helpful when they are practiced regularly. A mental health specialist can help determine which treatment is best for you. Some people see improvement with one therapy. However, other people require a combination of therapies.   This information is not intended to replace advice given to you by your health care provider. Make sure you discuss any questions you have with your health care provider.   Document Released: 11/10/2012 Document Revised: 08/06/2014 Document Reviewed: 11/10/2012 Elsevier Interactive Patient Education 2016 [employer] Inc[employer]  
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
## 2015-12-04 MED ORDER — LISINOPRIL 20 MG PO TABS: 20.0000 mg | ORAL_TABLET | Freq: Every morning | ORAL | Status: DC

## 2015-12-04 MED ORDER — AMLODIPINE BESYLATE 10 MG PO TABS: 10.0000 mg | ORAL_TABLET | Freq: Every morning | ORAL | Status: DC

## 2015-12-04 MED ORDER — ROSUVASTATIN CALCIUM 10 MG PO TABS: 10.0000 mg | ORAL_TABLET | Freq: Every morning | ORAL | Status: DC

## 2015-12-04 NOTE — Assessment & Plan Note (Signed)
Stable con't meds 

## 2015-12-06 ENCOUNTER — Ambulatory Visit: Payer: 59 | Admitting: Physical Therapy

## 2015-12-06 DIAGNOSIS — R262 Difficulty in walking, not elsewhere classified: Secondary | ICD-10-CM
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
## 2015-12-07 DIAGNOSIS — H47293 Other optic atrophy, bilateral: Secondary | ICD-10-CM | POA: Diagnosis not present

## 2015-12-07 DIAGNOSIS — H26492 Other secondary cataract, left eye: Secondary | ICD-10-CM | POA: Diagnosis not present

## 2015-12-07 DIAGNOSIS — H2511 Age-related nuclear cataract, right eye: Secondary | ICD-10-CM | POA: Diagnosis not present

## 2015-12-07 DIAGNOSIS — Z961 Presence of intraocular lens: Secondary | ICD-10-CM | POA: Diagnosis not present

## 2015-12-07 DIAGNOSIS — H31093 Other chorioretinal scars, bilateral: Secondary | ICD-10-CM | POA: Diagnosis not present

## 2015-12-08 ENCOUNTER — Encounter: Payer: Self-pay | Admitting: Physical Therapy

## 2015-12-08 ENCOUNTER — Ambulatory Visit: Payer: 59 | Admitting: Physical Therapy

## 2015-12-08 DIAGNOSIS — R262 Difficulty in walking, not elsewhere classified: Secondary | ICD-10-CM | POA: Diagnosis not present
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
## 2015-12-13 ENCOUNTER — Telehealth: Payer: Self-pay | Admitting: *Deleted

## 2015-12-13 ENCOUNTER — Encounter: Payer: Self-pay | Admitting: Physical Therapy

## 2015-12-13 ENCOUNTER — Ambulatory Visit: Payer: 59 | Admitting: Physical Therapy

## 2015-12-13 DIAGNOSIS — R262 Difficulty in walking, not elsewhere classified: Secondary | ICD-10-CM | POA: Diagnosis not present
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
## 2015-12-13 NOTE — Telephone Encounter (Signed)
Medical records received via mail from Dr. Coletta Memos. Forwarded to Dr Cydney Ok. JG//CMA

## 2015-12-15 ENCOUNTER — Ambulatory Visit: Payer: 59 | Admitting: Physical Therapy

## 2015-12-15 ENCOUNTER — Encounter: Payer: Self-pay | Admitting: Physical Therapy

## 2015-12-15 DIAGNOSIS — R262 Difficulty in walking, not elsewhere classified: Secondary | ICD-10-CM | POA: Diagnosis not present
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
## 2015-12-20 ENCOUNTER — Encounter: Payer: Self-pay | Admitting: Physical Therapy

## 2015-12-20 ENCOUNTER — Ambulatory Visit: Payer: 59 | Admitting: Physical Therapy

## 2015-12-20 DIAGNOSIS — R262 Difficulty in walking, not elsewhere classified: Secondary | ICD-10-CM | POA: Diagnosis not present
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
## 2015-12-22 ENCOUNTER — Ambulatory Visit: Payer: 59 | Admitting: Physical Therapy

## 2015-12-22 ENCOUNTER — Encounter: Payer: Self-pay | Admitting: Physical Therapy

## 2015-12-22 DIAGNOSIS — R262 Difficulty in walking, not elsewhere classified: Secondary | ICD-10-CM
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
## 2015-12-29 ENCOUNTER — Encounter: Payer: Self-pay | Admitting: Physical Therapy

## 2015-12-29 ENCOUNTER — Ambulatory Visit: Payer: 59 | Attending: Neurological Surgery | Admitting: Physical Therapy

## 2015-12-29 DIAGNOSIS — L97411 Non-pressure chronic ulcer of right heel and midfoot limited to breakdown of skin: Secondary | ICD-10-CM | POA: Diagnosis not present

## 2015-12-29 DIAGNOSIS — R262 Difficulty in walking, not elsewhere classified: Secondary | ICD-10-CM | POA: Diagnosis not present

## 2015-12-29 DIAGNOSIS — B351 Tinea unguium: Secondary | ICD-10-CM | POA: Diagnosis not present

## 2015-12-29 DIAGNOSIS — L851 Acquired keratosis [keratoderma] palmaris et plantaris: Secondary | ICD-10-CM | POA: Diagnosis not present
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
## 2016-01-03 ENCOUNTER — Ambulatory Visit (INDEPENDENT_AMBULATORY_CARE_PROVIDER_SITE_OTHER): Payer: 59 | Admitting: Family Medicine

## 2016-01-03 ENCOUNTER — Ambulatory Visit: Payer: 59 | Admitting: Physical Therapy

## 2016-01-03 ENCOUNTER — Encounter: Payer: Self-pay | Admitting: Family Medicine

## 2016-01-03 VITALS — BP 130/68 | HR 90 | Temp 98.4°F | Ht 68.0 in | Wt 205.8 lb

## 2016-01-03 DIAGNOSIS — F329 Major depressive disorder, single episode, unspecified: Secondary | ICD-10-CM

## 2016-01-03 DIAGNOSIS — F32A Depression, unspecified: Secondary | ICD-10-CM

## 2016-01-03 MED ORDER — SERTRALINE HCL 100 MG PO TABS: 100.0000 mg | ORAL_TABLET | Freq: Every day | ORAL | Status: DC

## 2016-01-03 MED FILL — HYDROCODON-APAP 5-325: 5-325 | 20 days supply | Qty: 60 | Fill #0

## 2016-01-03 MED FILL — SERTRALINE HCL 100 MG TAB: 100 | 30 days supply | Qty: 30 | Fill #0
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
## 2016-01-03 NOTE — Progress Notes (Signed)
Pre visit review using our clinic review tool, if applicable. No additional management support is needed unless otherwise documented below in the visit note. 

## 2016-01-03 NOTE — Patient Instructions (Signed)
Major Depressive Disorder Major depressive disorder is a mental illness. It also may be called clinical depression or unipolar depression. Major depressive disorder usually causes feelings of sadness, hopelessness, or helplessness. Some people with this disorder do not feel particularly sad but lose interest in doing things they used to enjoy (anhedonia). Major depressive disorder also can cause physical symptoms. It can interfere with work, school, relationships, and other normal everyday activities. The disorder varies in severity but is longer lasting and more serious than the sadness we all feel from time to time in our lives. Major depressive disorder often is triggered by stressful life events or major life changes. Examples of these triggers include divorce, loss of your job or home, a move, and the death of a family member or close friend. Sometimes this disorder occurs for no obvious reason at all. People who have family members with major depressive disorder or bipolar disorder are at higher risk for developing this disorder, with or without life stressors. Major depressive disorder can occur at any age. It may occur just once in your life (single episode major depressive disorder). It may occur multiple times (recurrent major depressive disorder). SYMPTOMS People with major depressive disorder have either anhedonia or depressed mood on nearly a daily basis for at least 2 weeks or longer. Symptoms of depressed mood include:  Feelings of sadness (blue or down in the dumps) or emptiness.  Feelings of hopelessness or helplessness.  Tearfulness or episodes of crying (may be observed by others).  Irritability (children and adolescents). In addition to depressed mood or anhedonia or both, people with this disorder have at least four of the following symptoms:  Difficulty sleeping or sleeping too much.   Significant change (increase or decrease) in appetite or weight.   Lack of energy or  motivation.  Feelings of guilt and worthlessness.   Difficulty concentrating, remembering, or making decisions.  Unusually slow movement (psychomotor retardation) or restlessness (as observed by others).   Recurrent wishes for death, recurrent thoughts of self-harm (suicide), or a suicide attempt. People with major depressive disorder commonly have persistent negative thoughts about themselves, other people, and the world. People with severe major depressive disorder may experiencedistorted beliefs or perceptions about the world (psychotic delusions). They also may see or hear things that are not real (psychotic hallucinations). DIAGNOSIS Major depressive disorder is diagnosed through an assessment by your health care provider. Your health care provider will ask aboutaspects of your daily life, such as mood,sleep, and appetite, to see if you have the diagnostic symptoms of major depressive disorder. Your health care provider may ask about your medical history and use of alcohol or drugs, including prescription medicines. Your health care provider also may do a physical exam and blood work. This is because certain medical conditions and the use of certain substances can cause major depressive disorder-like symptoms (secondary depression). Your health care provider also may refer you to a mental health specialist for further evaluation and treatment. TREATMENT It is important to recognize the symptoms of major depressive disorder and seek treatment. The following treatments can be prescribed for this disorder:   Medicine. Antidepressant medicines usually are prescribed. Antidepressant medicines are thought to correct chemical imbalances in the brain that are commonly associated with major depressive disorder. Other types of medicine may be added if the symptoms do not respond to antidepressant medicines alone or if psychotic delusions or hallucinations occur.  Talk therapy. Talk therapy can be  helpful in treating major depressive disorder by providing   support, education, and guidance. Certain types of talk therapy also can help with negative thinking (cognitive behavioral therapy) and with relationship issues that trigger this disorder (interpersonal therapy). A mental health specialist can help determine which treatment is best for you. Most people with major depressive disorder do well with a combination of medicine and talk therapy. Treatments involving electrical stimulation of the brain can be used in situations with extremely severe symptoms or when medicine and talk therapy do not work over time. These treatments include electroconvulsive therapy, transcranial magnetic stimulation, and vagal nerve stimulation.   This information is not intended to replace advice given to you by your health care provider. Make sure you discuss any questions you have with your health care provider.   Document Released: 11/10/2012 Document Revised: 08/06/2014 Document Reviewed: 11/10/2012 Elsevier Interactive Patient Education 2016 [employer] Inc[employer]  

## 2016-01-05 ENCOUNTER — Encounter: Payer: Self-pay | Admitting: Physical Therapy

## 2016-01-05 ENCOUNTER — Ambulatory Visit: Payer: 59 | Admitting: Physical Therapy

## 2016-01-05 DIAGNOSIS — R262 Difficulty in walking, not elsewhere classified: Secondary | ICD-10-CM | POA: Diagnosis not present
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
## 2016-01-10 ENCOUNTER — Encounter: Payer: Self-pay | Admitting: Physical Therapy

## 2016-01-10 ENCOUNTER — Ambulatory Visit: Payer: 59 | Admitting: Physical Therapy

## 2016-01-10 DIAGNOSIS — R262 Difficulty in walking, not elsewhere classified: Secondary | ICD-10-CM | POA: Diagnosis not present
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
## 2016-01-12 ENCOUNTER — Encounter: Payer: Self-pay | Admitting: Physical Therapy

## 2016-01-12 ENCOUNTER — Ambulatory Visit: Payer: 59 | Admitting: Physical Therapy

## 2016-01-12 DIAGNOSIS — R262 Difficulty in walking, not elsewhere classified: Secondary | ICD-10-CM | POA: Diagnosis not present
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
## 2016-01-13 DIAGNOSIS — T63461D Toxic effect of venom of wasps, accidental (unintentional), subsequent encounter: Secondary | ICD-10-CM | POA: Diagnosis not present

## 2016-01-13 DIAGNOSIS — T63451D Toxic effect of venom of hornets, accidental (unintentional), subsequent encounter: Secondary | ICD-10-CM | POA: Diagnosis not present

## 2016-01-17 ENCOUNTER — Other Ambulatory Visit: Payer: Self-pay | Admitting: Family Medicine

## 2016-01-17 DIAGNOSIS — I10 Essential (primary) hypertension: Secondary | ICD-10-CM

## 2016-01-17 MED ORDER — LISINOPRIL 20 MG PO TABS: 20.0000 mg | ORAL_TABLET | Freq: Every day | ORAL | Status: DC

## 2016-01-17 MED FILL — AMLODIPINE BESYLATE 10 MG T: 10 | 30 days supply | Qty: 30 | Fill #0

## 2016-01-17 MED FILL — ALLOPURINOL 100 MG TABLET: 100 | 90 days supply | Qty: 360 | Fill #0 | Status: TO

## 2016-01-17 MED FILL — LISINOPRIL 20 MG TABLET: 20 | 90 days supply | Qty: 90 | Fill #0 | Status: TO

## 2016-02-02 MED FILL — SERTRALINE HCL 100 MG TAB: 100 | 30 days supply | Qty: 30 | Fill #1

## 2016-02-13 ENCOUNTER — Ambulatory Visit (INDEPENDENT_AMBULATORY_CARE_PROVIDER_SITE_OTHER): Payer: 59 | Admitting: Family Medicine

## 2016-02-13 VITALS — BP 137/67 | HR 93

## 2016-02-13 DIAGNOSIS — S01502A Unspecified open wound of oral cavity, initial encounter: Secondary | ICD-10-CM

## 2016-02-13 NOTE — Patient Instructions (Signed)
You have a small wound on the roof of your mouth but I do not think it is anything more serious than that.  We used some silver nitrate to stop the bleeding. Try to stick to soft foods for the next 1-2 days, and if it bleeds again try applying pressure firmly for 5 minutes or so.  If you have any other problems with this please let me know!

## 2016-02-13 NOTE — Progress Notes (Signed)
Pre visit review using our clinic review tool, if applicable. No additional management support is needed unless otherwise documented below in the visit note. 
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
## 2016-02-14 DIAGNOSIS — T63451D Toxic effect of venom of hornets, accidental (unintentional), subsequent encounter: Secondary | ICD-10-CM | POA: Diagnosis not present

## 2016-02-14 DIAGNOSIS — T63441D Toxic effect of venom of bees, accidental (unintentional), subsequent encounter: Secondary | ICD-10-CM | POA: Diagnosis not present

## 2016-02-14 DIAGNOSIS — Z91030 Bee allergy status: Secondary | ICD-10-CM | POA: Diagnosis not present

## 2016-02-14 DIAGNOSIS — T63461D Toxic effect of venom of wasps, accidental (unintentional), subsequent encounter: Secondary | ICD-10-CM | POA: Diagnosis not present

## 2016-02-14 DIAGNOSIS — Q822 Mastocytosis: Secondary | ICD-10-CM | POA: Diagnosis not present

## 2016-02-16 ENCOUNTER — Ambulatory Visit (INDEPENDENT_AMBULATORY_CARE_PROVIDER_SITE_OTHER): Payer: 59 | Admitting: Family Medicine

## 2016-02-16 ENCOUNTER — Encounter: Payer: Self-pay | Admitting: Family Medicine

## 2016-02-16 VITALS — BP 122/60 | HR 102 | Temp 98.7°F | Ht 68.0 in | Wt 205.2 lb

## 2016-02-16 DIAGNOSIS — R7989 Other specified abnormal findings of blood chemistry: Secondary | ICD-10-CM

## 2016-02-16 DIAGNOSIS — E785 Hyperlipidemia, unspecified: Secondary | ICD-10-CM

## 2016-02-16 DIAGNOSIS — M10072 Idiopathic gout, left ankle and foot: Secondary | ICD-10-CM | POA: Diagnosis not present

## 2016-02-16 DIAGNOSIS — B351 Tinea unguium: Secondary | ICD-10-CM | POA: Diagnosis not present

## 2016-02-16 DIAGNOSIS — K219 Gastro-esophageal reflux disease without esophagitis: Secondary | ICD-10-CM

## 2016-02-16 DIAGNOSIS — I10 Essential (primary) hypertension: Secondary | ICD-10-CM

## 2016-02-16 DIAGNOSIS — R011 Cardiac murmur, unspecified: Secondary | ICD-10-CM

## 2016-02-16 DIAGNOSIS — L851 Acquired keratosis [keratoderma] palmaris et plantaris: Secondary | ICD-10-CM | POA: Diagnosis not present

## 2016-02-16 DIAGNOSIS — L97411 Non-pressure chronic ulcer of right heel and midfoot limited to breakdown of skin: Secondary | ICD-10-CM | POA: Diagnosis not present

## 2016-02-16 DIAGNOSIS — E7800 Pure hypercholesterolemia, unspecified: Secondary | ICD-10-CM

## 2016-02-16 MED ORDER — PANTOPRAZOLE SODIUM 40 MG PO TBEC: 40.0000 mg | DELAYED_RELEASE_TABLET | Freq: Every day | ORAL | Status: DC

## 2016-02-16 MED FILL — PANTOPRAZOLE SOD DR 40 MG T: 40 | 90 days supply | Qty: 90 | Fill #0

## 2016-02-16 NOTE — Progress Notes (Signed)
Pre visit review using our clinic review tool, if applicable. No additional management support is needed unless otherwise documented below in the visit note. 

## 2016-02-16 NOTE — Assessment & Plan Note (Signed)
Stable con't

## 2016-02-16 NOTE — Patient Instructions (Signed)
Heart Murmur A heart murmur is an extra sound heard by your health care provider when listening to your heart with a device called a stethoscope. The sound comes from turbulence when blood flows through the heart and may be a "hum" or "whoosh" sound heard when the heart beats. There are two types of heart murmurs:  Innocent murmurs. Most people with this type of heart murmur do not have a heart problem. Many children have innocent heart murmurs. Your health care provider may suggest some basic testing to know whether your murmur is an innocent murmur. If an innocent heart murmur is found, there is no need for further tests or treatment and no need to restrict activities or stop playing sports.  Abnormal murmurs. These types of murmurs can occur in children and adults. In children, abnormal heart murmurs are typically caused from heart defects that are present at birth (congenital). In adults, abnormal murmurs are usually from heart valve problems caused by disease, infection, or aging. CAUSES  Normally, these valves open to let blood flow through or out of your heart and then shut to keep it from flowing backward. If they do not work properly, you could have:  Regurgitation--When blood leaks back through the valve in the wrong direction.  Mitral valve prolapse--When the mitral valve of the heart has a loose flap and does not close tightly.  Stenosis--When the valve does not open enough and blocks blood flow. SIGNS AND SYMPTOMS  Innocent murmurs do not cause symptoms, and many people with abnormal murmurs may or may not have symptoms. If symptoms do develop, they may include:  Shortness of breath.  Blue coloring of the skin, especially on the fingertips.  Chest pain.  Palpitations, or feeling a fluttering or skipped heartbeat.  Fainting.  Persistent cough.  Getting tired much faster than expected. DIAGNOSIS  A heart murmur might be heard during a sports physical or during any type of  examination. When a murmur is heard, it may suggest a possible problem. When this happens, your health care provider may ask you to see a heart specialist (cardiologist). You may also be asked to have one or more heart tests. In these cases, testing may vary depending on what your health care provider heard. Tests for a heart murmur may include:  Electrocardiogram.  Echocardiogram.  MRI. For children and adults who have an abnormal heart murmur and want to play sports, it is important to complete testing, review test results, and receive recommendations from your health care provider. If heart disease is present, it may not be safe to play. TREATMENT  Innocent murmurs require no treatment or activity restriction. If an abnormal murmur represents a problem with the heart, treatment will depend on the exact nature of the problem. In these cases, medicine or surgery may be needed to treat the problem. HOME CARE INSTRUCTIONS If you want to participate in sports or other types of strenuous physical activity, it is important to discuss this first with your health care provider. If the murmur represents a problem with the heart and you choose to participate in sports, there is a small chance that a serious problem (including sudden death) could result.  SEEK MEDICAL CARE IF:   You feel that your symptoms are slowly worsening.  You develop any new symptoms that cause concern.  You feel that you are having side effects from any medicines prescribed. SEEK IMMEDIATE MEDICAL CARE IF:   You develop chest pain.  You have shortness of breath.  You notice that your heart beats irregularly often enough to cause you to worry.  You have fainting spells.  Your symptoms suddenly get worse.   This information is not intended to replace advice given to you by your health care provider. Make sure you discuss any questions you have with your health care provider.   Document Released: 08/23/2004 Document  Revised: 08/06/2014 Document Reviewed: 03/23/2013 Elsevier Interactive Patient Education [employer].
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
## 2016-02-16 NOTE — Assessment & Plan Note (Signed)
Stopped crestor a few months ago Check labs

## 2016-02-17 LAB — LIPID PANEL
Cholesterol: 216 mg/dL — ABNORMAL HIGH (ref 0–200)
HDL: 47.9 mg/dL (ref >39.00)
NonHDL: 167.77
Total CHOL/HDL Ratio: 5
Triglycerides: 343 mg/dL — ABNORMAL HIGH (ref 0.0–149.0)
VLDL: 68.6 mg/dL — ABNORMAL HIGH (ref 0.0–40.0)

## 2016-02-17 LAB — COMPREHENSIVE METABOLIC PANEL
ALT: 24 U/L (ref 0–53)
AST: 28 U/L (ref 0–37)
Albumin: 4.2 g/dL (ref 3.5–5.2)
Alkaline Phosphatase: 93 U/L (ref 39–117)
BUN: 21 mg/dL (ref 6–23)
CO2: 26 mEq/L (ref 19–32)
Calcium: 9.5 mg/dL (ref 8.4–10.5)
Chloride: 102 mEq/L (ref 96–112)
Creatinine, Ser: 1.55 mg/dL — ABNORMAL HIGH (ref 0.40–1.50)
GFR: 48.25 mL/min — ABNORMAL LOW (ref >60.00)
Glucose, Bld: 92 mg/dL (ref 70–99)
Potassium: 5.2 mEq/L — ABNORMAL HIGH (ref 3.5–5.1)
Sodium: 134 mEq/L — ABNORMAL LOW (ref 135–145)
Total Bilirubin: 0.3 mg/dL (ref 0.2–1.2)
Total Protein: 6.9 g/dL (ref 6.0–8.3)

## 2016-02-17 LAB — URIC ACID: Uric Acid, Serum: 6.7 mg/dL (ref 4.0–7.8)

## 2016-02-17 LAB — LDL CHOLESTEROL, DIRECT: Direct LDL: 152 mg/dL

## 2016-02-22 ENCOUNTER — Ambulatory Visit (HOSPITAL_BASED_OUTPATIENT_CLINIC_OR_DEPARTMENT_OTHER)
Admission: RE | Admit: 2016-02-22 | Discharge: 2016-02-22 | Disposition: A | Discharge status: Home / Self Care | Payer: 59 | Source: Ambulatory Visit | Attending: Family Medicine | Admitting: Family Medicine

## 2016-02-22 DIAGNOSIS — R011 Cardiac murmur, unspecified: Secondary | ICD-10-CM | POA: Diagnosis not present

## 2016-02-22 DIAGNOSIS — I119 Hypertensive heart disease without heart failure: Secondary | ICD-10-CM | POA: Insufficient documentation

## 2016-02-22 DIAGNOSIS — Z87891 Personal history of nicotine dependence: Secondary | ICD-10-CM | POA: Insufficient documentation

## 2016-02-22 NOTE — Progress Notes (Signed)
  Echocardiogram 2D Echocardiogram has been performed.  Jennette Dubin 02/22/2016, 10:07 AM

## 2016-03-02 MED FILL — SERTRALINE HCL 100 MG TAB: 100 | 30 days supply | Qty: 30 | Fill #2 | Status: TO

## 2016-03-07 ENCOUNTER — Ambulatory Visit (INDEPENDENT_AMBULATORY_CARE_PROVIDER_SITE_OTHER): Payer: 59 | Admitting: Family Medicine

## 2016-03-07 ENCOUNTER — Encounter: Payer: Self-pay | Admitting: Family Medicine

## 2016-03-07 VITALS — BP 118/63 | HR 86 | Temp 97.9°F | Ht 68.0 in | Wt 203.4 lb

## 2016-03-07 DIAGNOSIS — S01502S Unspecified open wound of oral cavity, sequela: Secondary | ICD-10-CM | POA: Diagnosis not present

## 2016-03-07 DIAGNOSIS — I10 Essential (primary) hypertension: Secondary | ICD-10-CM | POA: Diagnosis not present

## 2016-03-07 MED ORDER — AMLODIPINE BESYLATE 10 MG PO TABS: 10.0000 mg | ORAL_TABLET | Freq: Every morning | ORAL | 3 refills | Status: DC

## 2016-03-07 MED FILL — AMLODIPINE BESYLATE 10 MG T: 10 | 90 days supply | Qty: 90 | Fill #0 | Status: TO

## 2016-03-07 NOTE — Patient Instructions (Signed)
We will refer you to have a biopsy of the lesion on the roof of your mouth. Let me know if you do not hear about this appointment soon-  I also refilled your norvasc today

## 2016-03-07 NOTE — Progress Notes (Signed)
Pre visit review using our clinic review tool, if applicable. No additional management support is needed unless otherwise documented below in the visit note. 
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
## 2016-03-12 MED FILL — VESIcare 10 MG TABS: 10 | 90 days supply | Qty: 90 | Fill #1 | Status: TO

## 2016-03-13 DIAGNOSIS — E875 Hyperkalemia: Secondary | ICD-10-CM

## 2016-03-15 ENCOUNTER — Telehealth: Payer: 59 | Admitting: Physician Assistant

## 2016-03-15 DIAGNOSIS — R197 Diarrhea, unspecified: Secondary | ICD-10-CM | POA: Diagnosis not present

## 2016-03-15 NOTE — Progress Notes (Signed)
We are sorry that you are not feeling well.  Here is how we plan to help!  Based on what you have shared with me it looks like you have Acute Infectious Diarrhea.  Most cases of acute diarrhea are due to infections with virus and bacteria and are self-limited conditions lasting less than 14 days.  For your symptoms you may take Imodium 2 mg tablets that are over the counter at your local pharmacy. Take two tablet now and then one after each loose stool up to 6 a day.  Antibiotics are not needed for most people with diarrhea.  If you note continued mucous or pus in the stool, please schedule an appointment with your primary care provider, Dr. Carollee Herter. You are welcome to come see me if she is not available  as I work in the same office. At that point we would need and exam and stool testing to make sure there was no inflammatory cause of mucous in the stool.  HOME CARE  We recommend changing your diet to help with your symptoms for the next few days.  Drink plenty of fluids that contain water salt and sugar. Sports drinks such as Gatorade may help.   You may try broths, soups, bananas, applesauce, soft breads, mashed potatoes or crackers.   You are considered infectious for as long as the diarrhea continues. Hand washing or use of alcohol based hand sanitizers is recommend.  It is best to stay out of work or school until your symptoms stop.   GET HELP RIGHT AWAY  If you have dark yellow colored urine or do not pass urine frequently you should drink more fluids.    If your symptoms worsen   If you feel like you are going to pass out (faint)  You have a new problem  MAKE SURE YOU   Understand these instructions.  Will watch your condition.  Will get help right away if you are not doing well or get worse.  Your e-visit answers were reviewed by a board certified advanced clinical practitioner to complete your personal care plan.  Depending on the condition, your plan could have  included both over the counter or prescription medications.  If there is a problem please reply  once you have received a response from your provider.  Your safety is important to [place].  If you have drug allergies check your prescription carefully.    You can use MyChart to ask questions about today's visit, request a non-urgent call back, or ask for a work or school excuse for 24 hours related to this e-Visit. If it has been greater than 24 hours you will need to follow up with your provider, or enter a new e-Visit to address those concerns.   You will get an e-mail in the next two days asking about your experience.  I hope that your e-visit has been valuable and will speed your recovery. Thank you for using e-visits.

## 2016-03-19 ENCOUNTER — Telehealth: Payer: Self-pay | Admitting: Family Medicine

## 2016-03-19 ENCOUNTER — Encounter: Payer: Self-pay | Admitting: Family Medicine

## 2016-03-19 NOTE — Telephone Encounter (Signed)
Relation to MV:[facility] Call back number:[phone]:  Reason for call:  Patient states [facility] is network. Please advise

## 2016-03-19 NOTE — Telephone Encounter (Signed)
Referral was sent to [facility] 03/13/16. Did the pt say they are in network or out of network?

## 2016-03-19 NOTE — Telephone Encounter (Signed)
[facility] is not in-network, pt is going to [facility], will let me know if he needs anything further from [place]

## 2016-03-19 NOTE — Telephone Encounter (Signed)
error:[id number] ° °

## 2016-03-28 ENCOUNTER — Ambulatory Visit (HOSPITAL_BASED_OUTPATIENT_CLINIC_OR_DEPARTMENT_OTHER)
Admission: RE | Admit: 2016-03-28 | Discharge: 2016-03-28 | Disposition: A | Discharge status: Home / Self Care | Payer: 59 | Source: Ambulatory Visit | Attending: Medical | Admitting: Medical

## 2016-03-28 ENCOUNTER — Telehealth: Payer: Self-pay | Admitting: Medical

## 2016-03-28 ENCOUNTER — Ambulatory Visit (INDEPENDENT_AMBULATORY_CARE_PROVIDER_SITE_OTHER): Payer: 59 | Admitting: Medical

## 2016-03-28 ENCOUNTER — Encounter: Payer: Self-pay | Admitting: Medical

## 2016-03-28 VITALS — BP 133/76 | HR 77 | Temp 98.0°F | Wt 203.4 lb

## 2016-03-28 DIAGNOSIS — D649 Anemia, unspecified: Secondary | ICD-10-CM

## 2016-03-28 DIAGNOSIS — R197 Diarrhea, unspecified: Secondary | ICD-10-CM | POA: Insufficient documentation

## 2016-03-28 LAB — COMPREHENSIVE METABOLIC PANEL
ALT: 20 U/L (ref 0–53)
AST: 18 U/L (ref 0–37)
Albumin: 4.2 g/dL (ref 3.5–5.2)
Alkaline Phosphatase: 93 U/L (ref 39–117)
BUN: 16 mg/dL (ref 6–23)
CO2: 28 mEq/L (ref 19–32)
Calcium: 9.1 mg/dL (ref 8.4–10.5)
Chloride: 102 mEq/L (ref 96–112)
Creatinine, Ser: 1.36 mg/dL (ref 0.40–1.50)
GFR: 56.09 mL/min — ABNORMAL LOW (ref >60.00)
Glucose, Bld: 117 mg/dL — ABNORMAL HIGH (ref 70–99)
Potassium: 5.8 mEq/L — ABNORMAL HIGH (ref 3.5–5.1)
Sodium: 135 mEq/L (ref 135–145)
Total Bilirubin: 0.3 mg/dL (ref 0.2–1.2)
Total Protein: 7.1 g/dL (ref 6.0–8.3)

## 2016-03-28 LAB — CBC WITH DIFFERENTIAL/PLATELET
Basophils Absolute: 0 10*3/uL (ref 0.0–0.1)
Basophils Relative: 0.4 % (ref 0.0–3.0)
Eosinophils Absolute: 0.1 10*3/uL (ref 0.0–0.7)
Eosinophils Relative: 1.9 % (ref 0.0–5.0)
HCT: 36.3 % — ABNORMAL LOW (ref 39.0–52.0)
Hemoglobin: 12.1 g/dL — ABNORMAL LOW (ref 13.0–17.0)
Lymphocytes Relative: 8.2 % — ABNORMAL LOW (ref 12.0–46.0)
Lymphs Abs: 0.5 10*3/uL — ABNORMAL LOW (ref 0.7–4.0)
MCHC: 33.3 g/dL (ref 30.0–36.0)
MCV: 88.7 fl (ref 78.0–100.0)
Monocytes Absolute: 0.6 10*3/uL (ref 0.1–1.0)
Monocytes Relative: 8.9 % (ref 3.0–12.0)
Neutro Abs: 5 10*3/uL (ref 1.4–7.7)
Neutrophils Relative %: 80.6 % — ABNORMAL HIGH (ref 43.0–77.0)
Platelets: 234 10*3/uL (ref 150.0–400.0)
RBC: 4.1 Mil/uL — ABNORMAL LOW (ref 4.22–5.81)
RDW: 16.3 % — ABNORMAL HIGH (ref 11.5–15.5)
WBC: 6.2 10*3/uL (ref 4.0–10.5)

## 2016-03-28 MED ORDER — HYOSCYAMINE SULFATE ER 0.375 MG PO TB12: 0.3750 mg | ORAL_TABLET | Freq: Two times a day (BID) | ORAL | 0 refills | Status: DC

## 2016-03-28 MED ORDER — SODIUM POLYSTYRENE SULFONATE 15 GM/60ML PO SUSP: ORAL | 0 refills | Status: DC

## 2016-03-28 MED FILL — OSCIMIN SR 0.375 MG TABLET: 0.375 | 30 days supply | Qty: 60 | Fill #0

## 2016-03-28 NOTE — Telephone Encounter (Signed)
Spoke with pharmacist and she did not see any renal precaution on kayexalate. I wrote rx for med. Will advise use for 3 days. Then follow up Tuesday(out of town on [facility] repeat cmp to check k level. Asked pt to call our pharmacy tomorrow 11 am or so to see status of med. During interim asked pt to avoid any high k foods. If drinking any sports med stop.
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
## 2016-03-28 NOTE — Progress Notes (Signed)
Pre visit review using our clinic review tool, if applicable. No additional management support is needed unless otherwise documented below in the visit note. 

## 2016-03-28 NOTE — Patient Instructions (Addendum)
For your diarrha I want you to rest, hydrate, and  follow bland diet guidlines. You may find levbid more convenient for your loose stools. If does not work as well then revert back to immodium.(dc levbid if using imodium).  There is some chance that your have a bacterial infection so I do want you to get stool panel kit and turn that in as soon as possible. Turning stool panel kit earlier will provide [place] with quicker result of studies and more informed decision if antibiotics are needed.  Please get labs today and abd xray.  Work up being done to determine if infectious cause of illness vs ibs. We will follow you closely to see if exhibiting signs of early diverticulitis. If not improving and studies are coming back negative may get ct abd/pelvis and consider referral to GI.  Follow up 7 days or as needed. Please update me on how you are by Friday. Pending study results might call you in brief short course of antibiotics if not improved.

## 2016-03-29 ENCOUNTER — Other Ambulatory Visit: Payer: 59

## 2016-03-29 DIAGNOSIS — R197 Diarrhea, unspecified: Secondary | ICD-10-CM | POA: Diagnosis not present

## 2016-03-29 MED FILL — KIONEX 15 GM/60 ML SUS: 15 | 3 days supply | Qty: 540 | Fill #0

## 2016-03-30 LAB — OVA AND PARASITE EXAMINATION: OP: NONE SEEN

## 2016-03-30 LAB — CLOSTRIDIUM DIFFICILE BY PCR: Toxigenic C. Difficile by PCR: NOT DETECTED

## 2016-03-31 ENCOUNTER — Telehealth: Payer: Self-pay | Admitting: Medical

## 2016-03-31 NOTE — Telephone Encounter (Signed)
Pt stool studies are so far negative. How is he feeling. Does he have same symptoms? Need to know. If not improved will go ahead and make GI referral but that could take some time. So if not better I could refer but offer appointment with myself or his pcp if available sometime next week. He might need further imagine studies depending on his clinical presentation/how he is on exam. Remind also need to repeat his potassium level. I called him last week about k level.

## 2016-03-31 NOTE — Telephone Encounter (Signed)
Opened to review 

## 2016-04-01 ENCOUNTER — Encounter: Payer: Self-pay | Admitting: Medical

## 2016-04-02 LAB — STOOL CULTURE

## 2016-04-03 MED FILL — SERTRALINE HCL 100 MG TAB: 100 | 30 days supply | Qty: 30 | Fill #0

## 2016-04-03 NOTE — Telephone Encounter (Signed)
Called to follow up with patient. Pt states he has not had any issues in the past 2 days.  States he continues to take imodium.  He took two imodium yesterday and one today.  Denies nausea, vomiting or abdominal cramping.  Pt states he is not able to follow up this week or next week, but is willing to follow up around the 18th if need be.    Please advise.

## 2016-04-03 NOTE — Telephone Encounter (Signed)
Taylor Elliott.  Pt was supposed to start medicine to bring his potassium down. I sent that to his pharmacy. Will you call him directly and remind him. He was supposed to check k level/cmp today. Not sure he did. He can just come by lab and get that done. Does not need appointment with me.

## 2016-04-04 MED FILL — HYDROCODON-APAP 5-325: 5-325 | 2 days supply | Qty: 12 | Fill #0

## 2016-04-04 NOTE — Telephone Encounter (Signed)
Pt states he has the prescription but has not been taking medication. States he's due to have oral surgery in about an hour and needs to recover from that and then he's going out of town next week and so he will not be able to come back into the office period until the week of the 18th.  It was explained to patient that he's very important for him to have potassium rechecked.  He stated understanding and asked that we put the order in, but would not be able to come in until another week or so.

## 2016-04-11 DIAGNOSIS — K136 Irritative hyperplasia of oral mucosa: Secondary | ICD-10-CM | POA: Diagnosis not present

## 2016-04-17 DIAGNOSIS — T63461D Toxic effect of venom of wasps, accidental (unintentional), subsequent encounter: Secondary | ICD-10-CM | POA: Diagnosis not present

## 2016-04-17 DIAGNOSIS — T63451D Toxic effect of venom of hornets, accidental (unintentional), subsequent encounter: Secondary | ICD-10-CM | POA: Diagnosis not present

## 2016-04-19 ENCOUNTER — Encounter: Payer: Self-pay | Admitting: Family Medicine

## 2016-04-19 ENCOUNTER — Other Ambulatory Visit: Payer: Self-pay | Admitting: Family Medicine

## 2016-04-19 DIAGNOSIS — L851 Acquired keratosis [keratoderma] palmaris et plantaris: Secondary | ICD-10-CM | POA: Diagnosis not present

## 2016-04-19 DIAGNOSIS — E785 Hyperlipidemia, unspecified: Secondary | ICD-10-CM

## 2016-04-19 DIAGNOSIS — L97411 Non-pressure chronic ulcer of right heel and midfoot limited to breakdown of skin: Secondary | ICD-10-CM | POA: Diagnosis not present

## 2016-04-19 DIAGNOSIS — B351 Tinea unguium: Secondary | ICD-10-CM | POA: Diagnosis not present

## 2016-04-19 MED ORDER — ROSUVASTATIN CALCIUM 10 MG PO TABS: 10.0000 mg | ORAL_TABLET | Freq: Every day | ORAL | 3 refills | Status: DC

## 2016-04-19 MED FILL — LISINOPRIL 20 MG TABLET: 20 | 90 days supply | Qty: 90 | Fill #0

## 2016-04-19 MED FILL — ROSUVASTATIN CALCIUM 10 MG: 10 | 90 days supply | Qty: 90 | Fill #0

## 2016-04-19 MED FILL — HYDROCODON-APAP 5-325: 5-325 | 20 days supply | Qty: 60 | Fill #0

## 2016-04-19 NOTE — Telephone Encounter (Signed)
Crestor 10 mg is not on the medication list. Please advise    KP

## 2016-04-24 DIAGNOSIS — T63451D Toxic effect of venom of hornets, accidental (unintentional), subsequent encounter: Secondary | ICD-10-CM | POA: Diagnosis not present

## 2016-04-24 DIAGNOSIS — T63461D Toxic effect of venom of wasps, accidental (unintentional), subsequent encounter: Secondary | ICD-10-CM | POA: Diagnosis not present

## 2016-04-27 NOTE — Telephone Encounter (Signed)
I got old my chart message that was not delivered to me late due to being placed in wrong pool. But it did remind me that pt needs repeat cmp. He had high k level and on review looks like he never repeated the k level . Will you call him and ask him to get that rechecked. The order is still active.

## 2016-04-30 NOTE — Telephone Encounter (Signed)
Called patient and left a message for call back.  Also sent patient a message on mychart making him aware that provider would like for patient to return to clinic for repeat k level.

## 2016-05-01 DIAGNOSIS — T63461D Toxic effect of venom of wasps, accidental (unintentional), subsequent encounter: Secondary | ICD-10-CM | POA: Diagnosis not present

## 2016-05-01 DIAGNOSIS — T63451D Toxic effect of venom of hornets, accidental (unintentional), subsequent encounter: Secondary | ICD-10-CM | POA: Diagnosis not present

## 2016-05-02 DIAGNOSIS — C61 Malignant neoplasm of prostate: Secondary | ICD-10-CM | POA: Diagnosis not present

## 2016-05-02 DIAGNOSIS — Z85528 Personal history of other malignant neoplasm of kidney: Secondary | ICD-10-CM | POA: Diagnosis not present

## 2016-05-03 NOTE — Telephone Encounter (Signed)
Taylor Elliott, Please see MyChart message from patient.

## 2016-05-04 ENCOUNTER — Other Ambulatory Visit: Payer: Self-pay | Admitting: Family Medicine

## 2016-05-04 DIAGNOSIS — F32A Depression, unspecified: Secondary | ICD-10-CM

## 2016-05-04 DIAGNOSIS — F329 Major depressive disorder, single episode, unspecified: Secondary | ICD-10-CM

## 2016-05-04 MED FILL — SERTRALINE HCL 100 MG TAB: 100 | 30 days supply | Qty: 30 | Fill #0

## 2016-05-08 DIAGNOSIS — T63461D Toxic effect of venom of wasps, accidental (unintentional), subsequent encounter: Secondary | ICD-10-CM | POA: Diagnosis not present

## 2016-05-08 DIAGNOSIS — T63451D Toxic effect of venom of hornets, accidental (unintentional), subsequent encounter: Secondary | ICD-10-CM | POA: Diagnosis not present

## 2016-05-16 DIAGNOSIS — A09 Infectious gastroenteritis and colitis, unspecified: Secondary | ICD-10-CM | POA: Diagnosis not present

## 2016-06-04 MED FILL — VESIcare 10 MG TABS: 10 | 90 days supply | Qty: 90 | Fill #0

## 2016-06-04 MED FILL — SERTRALINE HCL 100 MG TAB: 100 | 30 days supply | Qty: 30 | Fill #1

## 2016-06-04 MED FILL — AMLODIPINE BESYLATE 10 MG T: 10 | 90 days supply | Qty: 90 | Fill #0

## 2016-06-19 MED FILL — GAVILYTE-N SOLUTION: 420 | 1 days supply | Qty: 4000 | Fill #0

## 2016-06-26 ENCOUNTER — Encounter: Payer: Self-pay | Admitting: Family Medicine

## 2016-06-26 DIAGNOSIS — D126 Benign neoplasm of colon, unspecified: Secondary | ICD-10-CM | POA: Diagnosis not present

## 2016-06-26 DIAGNOSIS — R197 Diarrhea, unspecified: Secondary | ICD-10-CM | POA: Diagnosis not present

## 2016-06-26 DIAGNOSIS — D123 Benign neoplasm of transverse colon: Secondary | ICD-10-CM | POA: Diagnosis not present

## 2016-06-26 LAB — HM COLONOSCOPY

## 2016-06-26 MED FILL — OSCIMIN SR 0.375 MG TABLET: 0.375 | 90 days supply | Qty: 180 | Fill #0

## 2016-06-27 DIAGNOSIS — T63461D Toxic effect of venom of wasps, accidental (unintentional), subsequent encounter: Secondary | ICD-10-CM | POA: Diagnosis not present

## 2016-06-27 DIAGNOSIS — T63451D Toxic effect of venom of hornets, accidental (unintentional), subsequent encounter: Secondary | ICD-10-CM | POA: Diagnosis not present

## 2016-06-28 ENCOUNTER — Ambulatory Visit (INDEPENDENT_AMBULATORY_CARE_PROVIDER_SITE_OTHER): Payer: Self-pay | Admitting: Family

## 2016-06-28 ENCOUNTER — Ambulatory Visit (INDEPENDENT_AMBULATORY_CARE_PROVIDER_SITE_OTHER): Payer: Self-pay | Admitting: Orthopedic Surgery

## 2016-06-29 ENCOUNTER — Other Ambulatory Visit: Payer: Self-pay | Admitting: Family Medicine

## 2016-06-29 DIAGNOSIS — F32A Depression, unspecified: Secondary | ICD-10-CM

## 2016-06-29 DIAGNOSIS — F329 Major depressive disorder, single episode, unspecified: Secondary | ICD-10-CM

## 2016-06-29 DIAGNOSIS — E785 Hyperlipidemia, unspecified: Secondary | ICD-10-CM

## 2016-06-29 DIAGNOSIS — I10 Essential (primary) hypertension: Secondary | ICD-10-CM

## 2016-06-29 MED ORDER — AMLODIPINE BESYLATE 10 MG PO TABS: 10.0000 mg | ORAL_TABLET | Freq: Every morning | ORAL | 3 refills | Status: DC

## 2016-06-29 MED ORDER — SERTRALINE HCL 100 MG PO TABS: 100.0000 mg | ORAL_TABLET | Freq: Every day | ORAL | 11 refills | Status: DC

## 2016-06-29 MED ORDER — LISINOPRIL 20 MG PO TABS: 20.0000 mg | ORAL_TABLET | Freq: Every day | ORAL | 3 refills | Status: DC

## 2016-06-29 MED ORDER — ROSUVASTATIN CALCIUM 10 MG PO TABS: 10.0000 mg | ORAL_TABLET | Freq: Every day | ORAL | 3 refills | Status: DC

## 2016-06-29 MED FILL — SERTRALINE HCL 100 MG TAB: 100 | 30 days supply | Qty: 30 | Fill #2

## 2016-06-29 MED FILL — ROSUVASTATIN CALCIUM 10 MG: 10 | 90 days supply | Qty: 90 | Fill #1

## 2016-06-29 MED FILL — LISINOPRIL 20 MG TABLET: 20 | 90 days supply | Qty: 90 | Fill #0

## 2016-06-29 NOTE — Telephone Encounter (Signed)
rx has been sent to pharmacy  Pc
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
## 2016-07-02 MED FILL — AMLODIPINE BESYLATE 10 MG T: 10 | 90 days supply | Qty: 90 | Fill #1

## 2016-07-02 MED FILL — VESIcare 10 MG TABS: 10 | 90 days supply | Qty: 90 | Fill #1

## 2016-07-04 ENCOUNTER — Telehealth (INDEPENDENT_AMBULATORY_CARE_PROVIDER_SITE_OTHER): Payer: Self-pay | Admitting: Radiology

## 2016-07-04 ENCOUNTER — Other Ambulatory Visit (INDEPENDENT_AMBULATORY_CARE_PROVIDER_SITE_OTHER): Payer: Self-pay

## 2016-07-04 MED ORDER — ALLOPURINOL 100 MG PO TABS: 200.0000 mg | ORAL_TABLET | Freq: Every morning | ORAL | 3 refills | Status: DC

## 2016-07-04 MED FILL — ALLOPURINOL 100 MG TABLET: 100 | 30 days supply | Qty: 60 | Fill #0

## 2016-07-04 NOTE — Telephone Encounter (Signed)
[facility] called needing rx for  Allopurinol 100 mg sent in.

## 2016-07-04 NOTE — Telephone Encounter (Signed)
o per Dr. Sharol Given for refill and this was sent to pharm.

## 2016-07-19 MED FILL — COLESTIPOL HCL 1 GM TABLET: 1 | 30 days supply | Qty: 60 | Fill #0

## 2016-07-25 DIAGNOSIS — T63451D Toxic effect of venom of hornets, accidental (unintentional), subsequent encounter: Secondary | ICD-10-CM | POA: Diagnosis not present

## 2016-07-25 DIAGNOSIS — T63441D Toxic effect of venom of bees, accidental (unintentional), subsequent encounter: Secondary | ICD-10-CM | POA: Diagnosis not present

## 2016-07-25 DIAGNOSIS — T63461D Toxic effect of venom of wasps, accidental (unintentional), subsequent encounter: Secondary | ICD-10-CM | POA: Diagnosis not present

## 2016-07-31 MED FILL — SERTRALINE HCL 100 MG TAB: 100 | 30 days supply | Qty: 30 | Fill #3

## 2016-07-31 MED FILL — ALLOPURINOL 100 MG TABLET: 100 | 30 days supply | Qty: 60 | Fill #1

## 2016-08-16 ENCOUNTER — Ambulatory Visit: Payer: 59 | Admitting: Family Medicine

## 2016-08-17 MED FILL — COLESTIPOL HCL 1 GM TABLET: 1 | 30 days supply | Qty: 60 | Fill #1

## 2016-08-23 ENCOUNTER — Ambulatory Visit (INDEPENDENT_AMBULATORY_CARE_PROVIDER_SITE_OTHER): Payer: 59 | Admitting: Family Medicine

## 2016-08-23 ENCOUNTER — Encounter: Payer: Self-pay | Admitting: Family Medicine

## 2016-08-23 VITALS — BP 122/60 | HR 83 | Temp 97.7°F | Resp 16 | Ht 69.0 in | Wt 191.2 lb

## 2016-08-23 DIAGNOSIS — F329 Major depressive disorder, single episode, unspecified: Secondary | ICD-10-CM | POA: Diagnosis not present

## 2016-08-23 DIAGNOSIS — F32A Depression, unspecified: Secondary | ICD-10-CM

## 2016-08-23 DIAGNOSIS — Z23 Encounter for immunization: Secondary | ICD-10-CM | POA: Diagnosis not present

## 2016-08-23 DIAGNOSIS — E785 Hyperlipidemia, unspecified: Secondary | ICD-10-CM

## 2016-08-23 LAB — COMPREHENSIVE METABOLIC PANEL
ALT: 18 U/L (ref 0–53)
AST: 16 U/L (ref 0–37)
Albumin: 4 g/dL (ref 3.5–5.2)
Alkaline Phosphatase: 106 U/L (ref 39–117)
BUN: 14 mg/dL (ref 6–23)
CO2: 26 mEq/L (ref 19–32)
Calcium: 9.4 mg/dL (ref 8.4–10.5)
Chloride: 109 mEq/L (ref 96–112)
Creatinine, Ser: 1.07 mg/dL (ref 0.40–1.50)
GFR: 73.87 mL/min (ref >60.00)
Glucose, Bld: 117 mg/dL — ABNORMAL HIGH (ref 70–99)
Potassium: 4.3 mEq/L (ref 3.5–5.1)
Sodium: 137 mEq/L (ref 135–145)
Total Bilirubin: 0.3 mg/dL (ref 0.2–1.2)
Total Protein: 7 g/dL (ref 6.0–8.3)

## 2016-08-23 LAB — LIPID PANEL
Cholesterol: 149 mg/dL (ref 0–200)
HDL: 50.3 mg/dL (ref >39.00)
LDL Cholesterol: 83 mg/dL (ref 0–99)
NonHDL: 98.27
Total CHOL/HDL Ratio: 3
Triglycerides: 78 mg/dL (ref 0.0–149.0)
VLDL: 15.6 mg/dL (ref 0.0–40.0)

## 2016-08-23 MED ORDER — SERTRALINE HCL 100 MG PO TABS: 100.0000 mg | ORAL_TABLET | Freq: Every day | ORAL | 3 refills | Status: DC
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
## 2016-08-23 NOTE — Patient Instructions (Signed)

## 2016-08-23 NOTE — Progress Notes (Signed)
Pre visit review using our clinic review tool, if applicable. No additional management support is needed unless otherwise documented below in the visit note. 

## 2016-08-25 DIAGNOSIS — F32A Depression, unspecified: Secondary | ICD-10-CM | POA: Insufficient documentation

## 2016-08-25 DIAGNOSIS — F329 Major depressive disorder, single episode, unspecified: Secondary | ICD-10-CM | POA: Insufficient documentation

## 2016-08-25 NOTE — Assessment & Plan Note (Signed)
Stable with zoloft con't meds

## 2016-08-28 MED FILL — SERTRALINE HCL 100 MG TAB: 100 | 90 days supply | Qty: 90 | Fill #0

## 2016-09-03 DIAGNOSIS — T63451D Toxic effect of venom of hornets, accidental (unintentional), subsequent encounter: Secondary | ICD-10-CM | POA: Diagnosis not present

## 2016-09-03 DIAGNOSIS — T63461D Toxic effect of venom of wasps, accidental (unintentional), subsequent encounter: Secondary | ICD-10-CM | POA: Diagnosis not present

## 2016-09-06 ENCOUNTER — Encounter (INDEPENDENT_AMBULATORY_CARE_PROVIDER_SITE_OTHER): Payer: Self-pay | Admitting: Orthopedic Surgery

## 2016-09-06 ENCOUNTER — Ambulatory Visit (INDEPENDENT_AMBULATORY_CARE_PROVIDER_SITE_OTHER): Payer: 59 | Admitting: Orthopedic Surgery

## 2016-09-06 VITALS — Ht 69.0 in | Wt 191.0 lb

## 2016-09-06 DIAGNOSIS — L98491 Non-pressure chronic ulcer of skin of other sites limited to breakdown of skin: Secondary | ICD-10-CM | POA: Insufficient documentation

## 2016-09-06 DIAGNOSIS — B351 Tinea unguium: Secondary | ICD-10-CM | POA: Diagnosis not present

## 2016-09-06 DIAGNOSIS — M21372 Foot drop, left foot: Secondary | ICD-10-CM | POA: Diagnosis not present

## 2016-09-06 MED ORDER — ALLOPURINOL 100 MG PO TABS: 200.0000 mg | ORAL_TABLET | Freq: Every morning | ORAL | 3 refills | Status: DC

## 2016-09-06 MED ORDER — HYDROCODONE-ACETAMINOPHEN 5-325 MG PO TABS: 1.0000 | ORAL_TABLET | Freq: Four times a day (QID) | ORAL | 0 refills | Status: DC | PRN

## 2016-09-06 MED FILL — HYDROCODON-APAP 5-325: 5-325 | 7 days supply | Qty: 30 | Fill #0

## 2016-09-06 MED FILL — ALLOPURINOL 100 MG TABLET: 100 | 90 days supply | Qty: 180 | Fill #0
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
## 2016-09-17 ENCOUNTER — Encounter (INDEPENDENT_AMBULATORY_CARE_PROVIDER_SITE_OTHER): Payer: Self-pay | Admitting: Orthopedic Surgery

## 2016-09-17 ENCOUNTER — Ambulatory Visit (INDEPENDENT_AMBULATORY_CARE_PROVIDER_SITE_OTHER): Payer: 59 | Admitting: Orthopedic Surgery

## 2016-09-17 VITALS — Ht 69.0 in | Wt 191.0 lb

## 2016-09-17 DIAGNOSIS — L98491 Non-pressure chronic ulcer of skin of other sites limited to breakdown of skin: Secondary | ICD-10-CM
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
## 2016-09-25 ENCOUNTER — Encounter: Payer: Self-pay | Admitting: Family Medicine

## 2016-09-26 ENCOUNTER — Encounter: Payer: Self-pay | Admitting: Family Medicine

## 2016-10-08 MED FILL — LISINOPRIL 20 MG TABLET: 20 | 90 days supply | Qty: 90 | Fill #1

## 2016-10-08 MED FILL — COLESTIPOL HCL 1 GM TABLET: 1 | 30 days supply | Qty: 60 | Fill #2

## 2016-10-08 MED FILL — AMLODIPINE BESYLATE 10 MG T: 10 | 90 days supply | Qty: 90 | Fill #2

## 2016-10-08 MED FILL — ROSUVASTATIN CALCIUM 10 MG: 10 | 90 days supply | Qty: 90 | Fill #2

## 2016-10-08 MED FILL — VESIcare 10 MG TABS: 10 | 30 days supply | Qty: 30 | Fill #0

## 2016-10-22 DIAGNOSIS — H2511 Age-related nuclear cataract, right eye: Secondary | ICD-10-CM | POA: Diagnosis not present

## 2016-10-22 DIAGNOSIS — H40013 Open angle with borderline findings, low risk, bilateral: Secondary | ICD-10-CM | POA: Diagnosis not present

## 2016-10-22 DIAGNOSIS — H25011 Cortical age-related cataract, right eye: Secondary | ICD-10-CM | POA: Diagnosis not present

## 2016-10-22 DIAGNOSIS — Z961 Presence of intraocular lens: Secondary | ICD-10-CM | POA: Diagnosis not present

## 2016-10-22 MED FILL — BRIMONIDINE 0.2% EYE DROP: 0.2 | 50 days supply | Qty: 5 | Fill #0

## 2016-10-22 MED FILL — CYCLOPENTOLATE 1% EYE DROPS: 1 | 12 days supply | Qty: 2 | Fill #0

## 2016-10-22 MED FILL — KETOROLAC 0.5% OPHTH SOLN: 0.5 | 15 days supply | Qty: 3 | Fill #0

## 2016-10-22 MED FILL — OFLOXACIN 0.3% EYE DROPS: 0.3 | 12 days supply | Qty: 5 | Fill #0

## 2016-10-22 MED FILL — PREDNISOLONE AC 1% EYE DROP: 1 | 25 days supply | Qty: 5 | Fill #0

## 2016-10-29 DIAGNOSIS — T63451D Toxic effect of venom of hornets, accidental (unintentional), subsequent encounter: Secondary | ICD-10-CM | POA: Diagnosis not present

## 2016-10-29 DIAGNOSIS — T63461D Toxic effect of venom of wasps, accidental (unintentional), subsequent encounter: Secondary | ICD-10-CM | POA: Diagnosis not present

## 2016-10-30 DIAGNOSIS — H21561 Pupillary abnormality, right eye: Secondary | ICD-10-CM | POA: Diagnosis not present

## 2016-10-30 DIAGNOSIS — H5703 Miosis: Secondary | ICD-10-CM | POA: Diagnosis not present

## 2016-10-30 DIAGNOSIS — H25811 Combined forms of age-related cataract, right eye: Secondary | ICD-10-CM | POA: Diagnosis not present

## 2016-10-30 DIAGNOSIS — H2511 Age-related nuclear cataract, right eye: Secondary | ICD-10-CM | POA: Diagnosis not present

## 2016-11-01 ENCOUNTER — Ambulatory Visit (INDEPENDENT_AMBULATORY_CARE_PROVIDER_SITE_OTHER): Payer: 59 | Admitting: Orthopedic Surgery

## 2016-11-01 ENCOUNTER — Encounter (INDEPENDENT_AMBULATORY_CARE_PROVIDER_SITE_OTHER): Payer: Self-pay | Admitting: Orthopedic Surgery

## 2016-11-01 VITALS — Ht 69.0 in | Wt 191.0 lb

## 2016-11-01 DIAGNOSIS — B351 Tinea unguium: Secondary | ICD-10-CM

## 2016-11-01 DIAGNOSIS — L98491 Non-pressure chronic ulcer of skin of other sites limited to breakdown of skin: Secondary | ICD-10-CM
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
## 2016-11-05 DIAGNOSIS — T63461D Toxic effect of venom of wasps, accidental (unintentional), subsequent encounter: Secondary | ICD-10-CM | POA: Diagnosis not present

## 2016-11-05 DIAGNOSIS — T63451D Toxic effect of venom of hornets, accidental (unintentional), subsequent encounter: Secondary | ICD-10-CM | POA: Diagnosis not present

## 2016-11-12 DIAGNOSIS — H0014 Chalazion left upper eyelid: Secondary | ICD-10-CM | POA: Diagnosis not present

## 2016-11-12 DIAGNOSIS — H01004 Unspecified blepharitis left upper eyelid: Secondary | ICD-10-CM | POA: Diagnosis not present

## 2016-11-12 MED FILL — VESIcare 10 MG TABS: 10 | 30 days supply | Qty: 30 | Fill #1

## 2016-11-12 MED FILL — NEO/POLY/DEXAMET EYE OINT: 3.5-10000-0 | 30 days supply | Qty: 4 | Fill #0

## 2016-11-15 ENCOUNTER — Ambulatory Visit (INDEPENDENT_AMBULATORY_CARE_PROVIDER_SITE_OTHER): Payer: 59 | Admitting: Orthopedic Surgery

## 2016-11-15 ENCOUNTER — Encounter (INDEPENDENT_AMBULATORY_CARE_PROVIDER_SITE_OTHER): Payer: Self-pay | Admitting: Orthopedic Surgery

## 2016-11-15 DIAGNOSIS — L84 Corns and callosities: Secondary | ICD-10-CM

## 2016-11-15 DIAGNOSIS — L98491 Non-pressure chronic ulcer of skin of other sites limited to breakdown of skin: Secondary | ICD-10-CM

## 2016-11-15 MED ORDER — HYDROCODONE-ACETAMINOPHEN 5-325 MG PO TABS: 1.0000 | ORAL_TABLET | Freq: Four times a day (QID) | ORAL | 0 refills | Status: DC | PRN

## 2016-11-15 MED FILL — HYDROCODON-APAP 5-325: 5-325 | 8 days supply | Qty: 30 | Fill #0
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
## 2016-11-20 ENCOUNTER — Encounter: Payer: Self-pay | Admitting: Family Medicine

## 2016-11-20 ENCOUNTER — Ambulatory Visit (INDEPENDENT_AMBULATORY_CARE_PROVIDER_SITE_OTHER): Payer: 59 | Admitting: Family Medicine

## 2016-11-20 VITALS — BP 100/50 | HR 92 | Temp 98.6°F | Resp 16 | Ht 69.0 in | Wt 202.2 lb

## 2016-11-20 DIAGNOSIS — F329 Major depressive disorder, single episode, unspecified: Secondary | ICD-10-CM | POA: Diagnosis not present

## 2016-11-20 DIAGNOSIS — Z23 Encounter for immunization: Secondary | ICD-10-CM

## 2016-11-20 DIAGNOSIS — F32A Depression, unspecified: Secondary | ICD-10-CM

## 2016-11-20 MED ORDER — SERTRALINE HCL 100 MG PO TABS: 150.0000 mg | ORAL_TABLET | Freq: Every day | ORAL | 1 refills | Status: DC

## 2016-11-20 MED FILL — SERTRALINE HCL 100 MG TAB: 100 | 90 days supply | Qty: 135 | Fill #0

## 2016-11-20 NOTE — Progress Notes (Signed)
Pre visit review using our clinic review tool, if applicable. No additional management support is needed unless otherwise documented below in the visit note. 
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
## 2016-11-20 NOTE — Patient Instructions (Addendum)
We are increasing your Zoloft to '150mg'$ .  You can take 1 and 1/2 tablet of what you have left.   Let us know in 2 weeks if this is working or not.       Living With Depression Everyone experiences occasional disappointment, sadness, and loss in their lives. When you are feeling down, blue, or sad for at least 2 weeks in a row, it may mean that you have depression. Depression can affect your thoughts and feelings, relationships, daily activities, and physical health. It is caused by changes in the way your brain functions. If you receive a diagnosis of depression, your health care provider will tell you which type of depression you have and what treatment options are available to you. If you are living with depression, there are ways to help you recover from it and also ways to prevent it from coming back. How to cope with lifestyle changes Coping with stress  Stress is your body's reaction to life changes and events, both good and bad. Stressful situations may include:  Getting married.  The death of a spouse.  Losing a job.  Retiring.  Having a baby. Stress can last just a few hours or it can be ongoing. Stress can play a major role in depression, so it is important to learn both how to cope with stress and how to think about it differently. Talk with your health care provider or a counselor if you would like to learn more about stress reduction. He or she may suggest some stress reduction techniques, such as:  Music therapy. This can include creating music or listening to music. Choose music that you enjoy and that inspires you.  Mindfulness-based meditation. This kind of meditation can be done while sitting or walking. It involves being aware of your normal breaths, rather than trying to control your breathing.  Centering prayer. This is a kind of meditation that involves focusing on a spiritual word or phrase. Choose a word, phrase, or sacred image that is meaningful to you and that  brings you peace.  Deep breathing. To do this, expand your stomach and inhale slowly through your nose. Hold your breath for 3-5 seconds, then exhale slowly, allowing your stomach muscles to relax.  Muscle relaxation. This involves intentionally tensing muscles then relaxing them. Choose a stress reduction technique that fits your lifestyle and personality. Stress reduction techniques take time and practice to develop. Set aside 5-15 minutes a day to do them. Therapists can offer training in these techniques. The training may be covered by some insurance plans. Other things you can do to manage stress include:  Keeping a stress diary. This can help you learn what triggers your stress and ways to control your response.  Understanding what your limits are and saying no to requests or events that lead to a schedule that is too full.  Thinking about how you respond to certain situations. You may not be able to control everything, but you can control how you react.  Adding humor to your life by watching funny films or TV shows.  Making time for activities that help you relax and not feeling guilty about spending your time this way. Medicines  Your health care provider may suggest certain medicines if he or she feels that they will help improve your condition. Avoid using alcohol and other substances that may prevent your medicines from working properly (may interact). It is also important to:  Talk with your pharmacist or health care provider about  all the medicines that you take, their possible side effects, and what medicines are safe to take together.  Make it your goal to take part in all treatment decisions (shared decision-making). This includes giving input on the side effects of medicines. It is best if shared decision-making with your health care provider is part of your total treatment plan. If your health care provider prescribes a medicine, you may not notice the full benefits of it for  4-8 weeks. Most people who are treated for depression need to be on medicine for at least 6-12 months after they feel better. If you are taking medicines as part of your treatment, do not stop taking medicines without first talking to your health care provider. You may need to have the medicine slowly decreased (tapered) over time to decrease the risk of harmful side effects. Relationships  Your health care provider may suggest family therapy along with individual therapy and drug therapy. While there may not be family problems that are causing you to feel depressed, it is still important to make sure your family learns as much as they can about your mental health. Having your family's support can help make your treatment successful. How to recognize changes in your condition Everyone has a different response to treatment for depression. Recovery from major depression happens when you have not had signs of major depression for two months. This may mean that you will start to:  Have more interest in doing activities.  Feel less hopeless than you did 2 months ago.  Have more energy.  Overeat less often, or have better or improving appetite.  Have better concentration. Your health care provider will work with you to decide the next steps in your recovery. It is also important to recognize when your condition is getting worse. Watch for these signs:  Having fatigue or low energy.  Eating too much or too little.  Sleeping too much or too little.  Feeling restless, agitated, or hopeless.  Having trouble concentrating or making decisions.  Having unexplained physical complaints.  Feeling irritable, angry, or aggressive. Get help as soon as you or your family members notice these symptoms coming back. How to get support and help from others How to talk with friends and family members about your condition  Talking to friends and family members about your condition can provide you with one way  to get support and guidance. Reach out to trusted friends or family members, explain your symptoms to them, and let them know that you are working with a health care provider to treat your depression. Financial resources  Not all insurance plans cover mental health care, so it is important to check with your insurance carrier. If paying for co-pays or counseling services is a problem, search for a local or county mental health care center. They may be able to offer public mental health care services at low or no cost when you are not able to see a private health care provider. If you are taking medicine for depression, you may be able to get the generic form, which may be less expensive. Some makers of prescription medicines also offer help to patients who cannot afford the medicines they need. Follow these instructions at home:  Get the right amount and quality of sleep.  Cut down on using caffeine, tobacco, alcohol, and other potentially harmful substances.  Try to exercise, such as walking or lifting small weights.  Take over-the-counter and prescription medicines only as told by your health  care provider.  Eat a healthy diet that includes plenty of vegetables, fruits, whole grains, low-fat dairy products, and lean protein. Do not eat a lot of foods that are high in solid fats, added sugars, or salt.  Keep all follow-up visits as told by your health care provider. This is important. Contact a health care provider if:  You stop taking your antidepressant medicines, and you have any of these symptoms:  Nausea.  Headache.  Feeling lightheaded.  Chills and body aches.  Not being able to sleep (insomnia).  You or your friends and family think your depression is getting worse. Get help right away if:  You have thoughts of hurting yourself or others. If you ever feel like you may hurt yourself or others, or have thoughts about taking your own life, get help right away. You can go to  your nearest emergency department or call:  Your local emergency services (911 in the [place]  A suicide crisis helpline, such as the [place] at 575-001-1560. This is open 24-hours a day. Summary  If you are living with depression, there are ways to help you recover from it and also ways to prevent it from coming back.  Work with your health care team to create a management plan that includes counseling, stress management techniques, and healthy lifestyle habits. This information is not intended to replace advice given to you by your health care provider. Make sure you discuss any questions you have with your health care provider. Document Released: 06/18/2016 Document Revised: 06/18/2016 Document Reviewed: 06/18/2016 Elsevier Interactive Patient Education  2017 [employer].

## 2016-11-20 NOTE — Assessment & Plan Note (Signed)
Inc zoloft to 150 mg If no improvement in 2 weeks will consider changing meds rto 3 month

## 2016-11-21 DIAGNOSIS — C61 Malignant neoplasm of prostate: Secondary | ICD-10-CM | POA: Diagnosis not present

## 2016-11-23 DIAGNOSIS — Z85528 Personal history of other malignant neoplasm of kidney: Secondary | ICD-10-CM | POA: Diagnosis not present

## 2016-11-23 DIAGNOSIS — C61 Malignant neoplasm of prostate: Secondary | ICD-10-CM | POA: Diagnosis not present

## 2016-11-23 DIAGNOSIS — C641 Malignant neoplasm of right kidney, except renal pelvis: Secondary | ICD-10-CM | POA: Diagnosis not present

## 2016-11-23 DIAGNOSIS — R911 Solitary pulmonary nodule: Secondary | ICD-10-CM | POA: Diagnosis not present

## 2016-11-28 DIAGNOSIS — C61 Malignant neoplasm of prostate: Secondary | ICD-10-CM | POA: Diagnosis not present

## 2016-11-28 DIAGNOSIS — Z85528 Personal history of other malignant neoplasm of kidney: Secondary | ICD-10-CM | POA: Diagnosis not present

## 2016-12-03 MED FILL — COLESTIPOL HCL 1 GM TABLET: 1 | 30 days supply | Qty: 60 | Fill #3

## 2016-12-05 ENCOUNTER — Ambulatory Visit (INDEPENDENT_AMBULATORY_CARE_PROVIDER_SITE_OTHER): Payer: 59 | Admitting: Medical

## 2016-12-05 VITALS — BP 125/62 | HR 78 | Temp 98.1°F | Resp 16 | Ht 69.0 in | Wt 197.6 lb

## 2016-12-05 DIAGNOSIS — L089 Local infection of the skin and subcutaneous tissue, unspecified: Secondary | ICD-10-CM

## 2016-12-05 DIAGNOSIS — S90425A Blister (nonthermal), left lesser toe(s), initial encounter: Secondary | ICD-10-CM | POA: Diagnosis not present

## 2016-12-05 NOTE — Patient Instructions (Addendum)
Based on your history of bone infection years ago, amputated toe in the past and current exam, I do think it is best to see your prior  orthopedist in event you need procedure done.   I apologize that I needed to send you.  Will send your pcp copy of this note and make her aware of your condition.  Note offered pt injection rocephin im and rx doxycycline as well as xray of foot to assess bone of great toe. Pt declined all stating he will just see Dr. Sharol Given and [place] orthopedist.  He was appeared frustrated at first but I did explained why I thought it was best to have ortho due procedure rather than myself. He seemed to express understanding and made comment he almost called Dr. Haroldine Laws orthopedist first before he came in to see me.

## 2016-12-05 NOTE — Progress Notes (Signed)
Pre visit review using our clinic review tool, if applicable. No additional management support is needed unless otherwise documented below in the visit note. 
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
## 2016-12-06 ENCOUNTER — Ambulatory Visit (INDEPENDENT_AMBULATORY_CARE_PROVIDER_SITE_OTHER): Payer: 59 | Admitting: Orthopedic Surgery

## 2016-12-06 ENCOUNTER — Encounter (INDEPENDENT_AMBULATORY_CARE_PROVIDER_SITE_OTHER): Payer: Self-pay | Admitting: Orthopedic Surgery

## 2016-12-06 VITALS — Ht 69.0 in | Wt 197.0 lb

## 2016-12-06 DIAGNOSIS — L97521 Non-pressure chronic ulcer of other part of left foot limited to breakdown of skin: Secondary | ICD-10-CM | POA: Insufficient documentation

## 2016-12-06 DIAGNOSIS — M21372 Foot drop, left foot: Secondary | ICD-10-CM

## 2016-12-06 MED ORDER — MUPIROCIN 2 % EX OINT: 1.0000 "application " | TOPICAL_OINTMENT | Freq: Two times a day (BID) | CUTANEOUS | 3 refills | Status: DC

## 2016-12-06 MED FILL — MUPIROCIN 2% OINTMENT: 2 | 10 days supply | Qty: 22 | Fill #0
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
## 2016-12-13 DIAGNOSIS — T63461D Toxic effect of venom of wasps, accidental (unintentional), subsequent encounter: Secondary | ICD-10-CM | POA: Diagnosis not present

## 2016-12-13 DIAGNOSIS — T63451D Toxic effect of venom of hornets, accidental (unintentional), subsequent encounter: Secondary | ICD-10-CM | POA: Diagnosis not present

## 2016-12-13 MED FILL — EPINEPHRINE 0.3 MG AUTO-INJ: 0.3 | 30 days supply | Qty: 2 | Fill #0

## 2016-12-16 MED FILL — ALLOPURINOL 100 MG TABLET: 100 | 90 days supply | Qty: 180 | Fill #1

## 2016-12-17 MED FILL — VESIcare 10 MG TABS: 10 | 30 days supply | Qty: 30 | Fill #2

## 2016-12-31 ENCOUNTER — Other Ambulatory Visit: Payer: Self-pay | Admitting: Family Medicine

## 2016-12-31 DIAGNOSIS — I10 Essential (primary) hypertension: Secondary | ICD-10-CM

## 2016-12-31 MED FILL — ROSUVASTATIN CALCIUM 10 MG: 10 | 90 days supply | Qty: 90 | Fill #3

## 2016-12-31 MED FILL — LISINOPRIL 20 MG TAB: 20 | 90 days supply | Qty: 90 | Fill #2

## 2017-01-02 MED FILL — AMLODIPINE BESYLATE 10 MG T: 10 | 90 days supply | Qty: 90 | Fill #0

## 2017-01-03 ENCOUNTER — Encounter (INDEPENDENT_AMBULATORY_CARE_PROVIDER_SITE_OTHER): Payer: Self-pay | Admitting: Orthopedic Surgery

## 2017-01-03 ENCOUNTER — Ambulatory Visit (INDEPENDENT_AMBULATORY_CARE_PROVIDER_SITE_OTHER): Payer: 59 | Admitting: Orthopedic Surgery

## 2017-01-03 VITALS — Ht 69.0 in | Wt 197.0 lb

## 2017-01-03 DIAGNOSIS — L97521 Non-pressure chronic ulcer of other part of left foot limited to breakdown of skin: Secondary | ICD-10-CM

## 2017-01-03 DIAGNOSIS — B351 Tinea unguium: Secondary | ICD-10-CM

## 2017-01-03 MED ORDER — HYDROCODONE-ACETAMINOPHEN 5-325 MG PO TABS: 1.0000 | ORAL_TABLET | Freq: Four times a day (QID) | ORAL | 0 refills | Status: DC | PRN

## 2017-01-03 MED FILL — HYDROCODON-APAP 5-325: 5-325 | 7 days supply | Qty: 30 | Fill #0
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
## 2017-01-07 DIAGNOSIS — H35033 Hypertensive retinopathy, bilateral: Secondary | ICD-10-CM | POA: Diagnosis not present

## 2017-01-07 DIAGNOSIS — H40013 Open angle with borderline findings, low risk, bilateral: Secondary | ICD-10-CM | POA: Diagnosis not present

## 2017-01-07 DIAGNOSIS — H01009 Unspecified blepharitis unspecified eye, unspecified eyelid: Secondary | ICD-10-CM | POA: Diagnosis not present

## 2017-01-07 DIAGNOSIS — H40051 Ocular hypertension, right eye: Secondary | ICD-10-CM | POA: Diagnosis not present

## 2017-01-17 DIAGNOSIS — T63451D Toxic effect of venom of hornets, accidental (unintentional), subsequent encounter: Secondary | ICD-10-CM | POA: Diagnosis not present

## 2017-01-17 DIAGNOSIS — T63461D Toxic effect of venom of wasps, accidental (unintentional), subsequent encounter: Secondary | ICD-10-CM | POA: Diagnosis not present

## 2017-01-24 ENCOUNTER — Encounter (INDEPENDENT_AMBULATORY_CARE_PROVIDER_SITE_OTHER): Payer: Self-pay | Admitting: Orthopedic Surgery

## 2017-01-24 ENCOUNTER — Ambulatory Visit (INDEPENDENT_AMBULATORY_CARE_PROVIDER_SITE_OTHER): Payer: 59 | Admitting: Orthopedic Surgery

## 2017-01-24 VITALS — Ht 69.0 in | Wt 197.0 lb

## 2017-01-24 DIAGNOSIS — L97521 Non-pressure chronic ulcer of other part of left foot limited to breakdown of skin: Secondary | ICD-10-CM

## 2017-01-24 DIAGNOSIS — L98491 Non-pressure chronic ulcer of skin of other sites limited to breakdown of skin: Secondary | ICD-10-CM | POA: Diagnosis not present

## 2017-01-24 DIAGNOSIS — M21372 Foot drop, left foot: Secondary | ICD-10-CM
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
## 2017-01-31 MED FILL — COLESTIPOL HCL 1 GM TABLET: 1 | 30 days supply | Qty: 60 | Fill #0

## 2017-02-04 MED FILL — VESIcare 10 MG TABS: 10 | 30 days supply | Qty: 30 | Fill #3

## 2017-02-07 ENCOUNTER — Ambulatory Visit (INDEPENDENT_AMBULATORY_CARE_PROVIDER_SITE_OTHER): Payer: 59 | Admitting: Orthopedic Surgery

## 2017-02-14 DIAGNOSIS — Z91030 Bee allergy status: Secondary | ICD-10-CM | POA: Diagnosis not present

## 2017-02-14 DIAGNOSIS — T63441D Toxic effect of venom of bees, accidental (unintentional), subsequent encounter: Secondary | ICD-10-CM | POA: Diagnosis not present

## 2017-02-14 DIAGNOSIS — T63451D Toxic effect of venom of hornets, accidental (unintentional), subsequent encounter: Secondary | ICD-10-CM | POA: Diagnosis not present

## 2017-02-14 DIAGNOSIS — Q822 Mastocytosis: Secondary | ICD-10-CM | POA: Diagnosis not present

## 2017-02-14 DIAGNOSIS — T63461D Toxic effect of venom of wasps, accidental (unintentional), subsequent encounter: Secondary | ICD-10-CM | POA: Diagnosis not present

## 2017-02-19 ENCOUNTER — Encounter: Payer: Self-pay | Admitting: Family Medicine

## 2017-02-21 ENCOUNTER — Ambulatory Visit (INDEPENDENT_AMBULATORY_CARE_PROVIDER_SITE_OTHER): Payer: 59 | Admitting: Orthopedic Surgery

## 2017-02-21 ENCOUNTER — Ambulatory Visit (INDEPENDENT_AMBULATORY_CARE_PROVIDER_SITE_OTHER): Payer: 59

## 2017-02-21 ENCOUNTER — Encounter (INDEPENDENT_AMBULATORY_CARE_PROVIDER_SITE_OTHER): Payer: Self-pay | Admitting: Orthopedic Surgery

## 2017-02-21 VITALS — Ht 69.0 in | Wt 197.0 lb

## 2017-02-21 DIAGNOSIS — L97521 Non-pressure chronic ulcer of other part of left foot limited to breakdown of skin: Secondary | ICD-10-CM

## 2017-02-21 DIAGNOSIS — M25562 Pain in left knee: Secondary | ICD-10-CM

## 2017-02-21 DIAGNOSIS — M7042 Prepatellar bursitis, left knee: Secondary | ICD-10-CM | POA: Diagnosis not present

## 2017-02-21 DIAGNOSIS — G8929 Other chronic pain: Secondary | ICD-10-CM

## 2017-02-21 MED ORDER — METHYLPREDNISOLONE ACETATE 40 MG/ML IJ SUSP
40.0000 mg | INTRAMUSCULAR | Status: AC | PRN
  Administered 2017-02-21: 40 mg via INTRA_ARTICULAR

## 2017-02-21 MED ORDER — LIDOCAINE HCL 1 % IJ SOLN
5.0000 mL | INTRAMUSCULAR | Status: AC | PRN
  Administered 2017-02-21: 5 mL

## 2017-02-21 MED ORDER — HYDROCODONE-ACETAMINOPHEN 5-325 MG PO TABS: 1.0000 | ORAL_TABLET | Freq: Four times a day (QID) | ORAL | 0 refills | Status: DC | PRN

## 2017-02-21 MED FILL — HYDROCODON-APAP 5-325: 5-325 | 5 days supply | Qty: 20 | Fill #0
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
## 2017-02-26 ENCOUNTER — Ambulatory Visit (INDEPENDENT_AMBULATORY_CARE_PROVIDER_SITE_OTHER): Payer: 59 | Admitting: Family Medicine

## 2017-02-26 ENCOUNTER — Encounter: Payer: Self-pay | Admitting: Family Medicine

## 2017-02-26 VITALS — BP 120/68 | HR 83 | Temp 98.6°F | Ht 68.0 in | Wt 202.0 lb

## 2017-02-26 DIAGNOSIS — I10 Essential (primary) hypertension: Secondary | ICD-10-CM | POA: Diagnosis not present

## 2017-02-26 DIAGNOSIS — F418 Other specified anxiety disorders: Secondary | ICD-10-CM | POA: Diagnosis not present

## 2017-02-26 DIAGNOSIS — E7800 Pure hypercholesterolemia, unspecified: Secondary | ICD-10-CM | POA: Diagnosis not present

## 2017-02-26 MED ORDER — SERTRALINE HCL 100 MG PO TABS: ORAL_TABLET | ORAL | 3 refills | Status: DC

## 2017-02-26 MED FILL — SERTRALINE HCL 100 MG TAB: 100 | 90 days supply | Qty: 180 | Fill #0 | Status: TO

## 2017-02-26 NOTE — Assessment & Plan Note (Signed)
Well controlled, no changes to meds. Encouraged heart healthy diet such as the DASH diet and exercise as tolerated.  °

## 2017-02-26 NOTE — Assessment & Plan Note (Signed)
Tolerating statin, encouraged heart healthy diet, avoid trans fats, minimize simple carbs and saturated fats. Increase exercise as tolerated 

## 2017-02-26 NOTE — Patient Instructions (Signed)
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
## 2017-03-05 MED FILL — VESIcare 10 MG TABS: 10 | 30 days supply | Qty: 30 | Fill #4

## 2017-03-20 MED FILL — ALLOPURINOL 100 MG TABS: 100 | 90 days supply | Qty: 180 | Fill #2

## 2017-03-20 MED FILL — COLESTIPOL HCL 1 GM TABLET: 1 | 30 days supply | Qty: 60 | Fill #1

## 2017-03-27 DIAGNOSIS — T63461D Toxic effect of venom of wasps, accidental (unintentional), subsequent encounter: Secondary | ICD-10-CM | POA: Diagnosis not present

## 2017-03-27 DIAGNOSIS — T63441D Toxic effect of venom of bees, accidental (unintentional), subsequent encounter: Secondary | ICD-10-CM | POA: Diagnosis not present

## 2017-03-27 DIAGNOSIS — T63451D Toxic effect of venom of hornets, accidental (unintentional), subsequent encounter: Secondary | ICD-10-CM | POA: Diagnosis not present

## 2017-03-29 ENCOUNTER — Telehealth: Payer: Self-pay

## 2017-03-29 NOTE — Telephone Encounter (Signed)
Pt scheduled  

## 2017-04-02 ENCOUNTER — Ambulatory Visit (INDEPENDENT_AMBULATORY_CARE_PROVIDER_SITE_OTHER): Payer: 59

## 2017-04-02 DIAGNOSIS — Z23 Encounter for immunization: Secondary | ICD-10-CM

## 2017-04-02 NOTE — Progress Notes (Signed)
Pre visit review using our clinic tool,if applicable. No additional management support is needed unless otherwise documented below in the visit note.   Patient in for 2nd dose of Shingrix vaccination per order from Dr. Roma Schanz.  Given 0.23ml IM left deltoid. No complaints voiced. Patient tolerated well.

## 2017-04-04 ENCOUNTER — Ambulatory Visit (INDEPENDENT_AMBULATORY_CARE_PROVIDER_SITE_OTHER): Payer: 59 | Admitting: Orthopedic Surgery

## 2017-04-04 ENCOUNTER — Encounter (INDEPENDENT_AMBULATORY_CARE_PROVIDER_SITE_OTHER): Payer: Self-pay | Admitting: Orthopedic Surgery

## 2017-04-04 DIAGNOSIS — L98491 Non-pressure chronic ulcer of skin of other sites limited to breakdown of skin: Secondary | ICD-10-CM | POA: Diagnosis not present

## 2017-04-04 DIAGNOSIS — M21372 Foot drop, left foot: Secondary | ICD-10-CM | POA: Diagnosis not present

## 2017-04-04 DIAGNOSIS — L97521 Non-pressure chronic ulcer of other part of left foot limited to breakdown of skin: Secondary | ICD-10-CM

## 2017-04-04 MED ORDER — HYDROCODONE-ACETAMINOPHEN 5-325 MG PO TABS: 1.0000 | ORAL_TABLET | Freq: Four times a day (QID) | ORAL | 0 refills | Status: DC | PRN
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
## 2017-04-09 MED FILL — HYDROCODON-APAP 5-325: 5-325 | 5 days supply | Qty: 20 | Fill #0

## 2017-04-20 MED FILL — LISINOPRIL 20 MG TAB: 20 | 90 days supply | Qty: 90 | Fill #3

## 2017-04-22 MED FILL — ROSUVASTATIN CALCIUM 10 MG: 10 | 90 days supply | Qty: 90 | Fill #0

## 2017-04-22 MED FILL — AMLODIPINE BESYLATE 10 MG T: 10 | 90 days supply | Qty: 90 | Fill #1

## 2017-04-22 MED FILL — VESIcare 10 MG TABS: 10 | 30 days supply | Qty: 30 | Fill #5

## 2017-04-25 DIAGNOSIS — T63451D Toxic effect of venom of hornets, accidental (unintentional), subsequent encounter: Secondary | ICD-10-CM | POA: Diagnosis not present

## 2017-04-25 DIAGNOSIS — T63441D Toxic effect of venom of bees, accidental (unintentional), subsequent encounter: Secondary | ICD-10-CM | POA: Diagnosis not present

## 2017-04-25 DIAGNOSIS — T63461D Toxic effect of venom of wasps, accidental (unintentional), subsequent encounter: Secondary | ICD-10-CM | POA: Diagnosis not present

## 2017-05-02 ENCOUNTER — Ambulatory Visit (INDEPENDENT_AMBULATORY_CARE_PROVIDER_SITE_OTHER): Payer: 59 | Admitting: Orthopedic Surgery

## 2017-05-02 ENCOUNTER — Encounter (INDEPENDENT_AMBULATORY_CARE_PROVIDER_SITE_OTHER): Payer: Self-pay | Admitting: Orthopedic Surgery

## 2017-05-02 DIAGNOSIS — M21372 Foot drop, left foot: Secondary | ICD-10-CM | POA: Diagnosis not present

## 2017-05-02 DIAGNOSIS — L97521 Non-pressure chronic ulcer of other part of left foot limited to breakdown of skin: Secondary | ICD-10-CM | POA: Diagnosis not present
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
## 2017-05-20 ENCOUNTER — Encounter (INDEPENDENT_AMBULATORY_CARE_PROVIDER_SITE_OTHER): Payer: Self-pay | Admitting: Orthopedic Surgery

## 2017-05-20 ENCOUNTER — Ambulatory Visit (INDEPENDENT_AMBULATORY_CARE_PROVIDER_SITE_OTHER): Payer: 59 | Admitting: Orthopedic Surgery

## 2017-05-20 DIAGNOSIS — M21372 Foot drop, left foot: Secondary | ICD-10-CM | POA: Diagnosis not present

## 2017-05-20 DIAGNOSIS — L97521 Non-pressure chronic ulcer of other part of left foot limited to breakdown of skin: Secondary | ICD-10-CM | POA: Diagnosis not present
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
## 2017-05-22 DIAGNOSIS — T63441D Toxic effect of venom of bees, accidental (unintentional), subsequent encounter: Secondary | ICD-10-CM | POA: Diagnosis not present

## 2017-05-22 DIAGNOSIS — T63451D Toxic effect of venom of hornets, accidental (unintentional), subsequent encounter: Secondary | ICD-10-CM | POA: Diagnosis not present

## 2017-05-22 DIAGNOSIS — T63461D Toxic effect of venom of wasps, accidental (unintentional), subsequent encounter: Secondary | ICD-10-CM | POA: Diagnosis not present

## 2017-05-23 DIAGNOSIS — H40013 Open angle with borderline findings, low risk, bilateral: Secondary | ICD-10-CM | POA: Diagnosis not present

## 2017-05-23 DIAGNOSIS — H26493 Other secondary cataract, bilateral: Secondary | ICD-10-CM | POA: Diagnosis not present

## 2017-05-23 DIAGNOSIS — Z961 Presence of intraocular lens: Secondary | ICD-10-CM | POA: Diagnosis not present

## 2017-05-23 DIAGNOSIS — H35033 Hypertensive retinopathy, bilateral: Secondary | ICD-10-CM | POA: Diagnosis not present

## 2017-05-24 MED FILL — SERTRALINE HCL 100 MG TAB: 100 | 90 days supply | Qty: 180 | Fill #0

## 2017-05-24 MED FILL — VESIcare 10 MG TABS: 10 | 30 days supply | Qty: 30 | Fill #0

## 2017-06-12 ENCOUNTER — Ambulatory Visit (INDEPENDENT_AMBULATORY_CARE_PROVIDER_SITE_OTHER): Payer: 59

## 2017-06-12 DIAGNOSIS — Z23 Encounter for immunization: Secondary | ICD-10-CM | POA: Diagnosis not present

## 2017-06-13 ENCOUNTER — Ambulatory Visit (INDEPENDENT_AMBULATORY_CARE_PROVIDER_SITE_OTHER): Payer: 59 | Admitting: Orthopedic Surgery

## 2017-06-13 ENCOUNTER — Encounter (INDEPENDENT_AMBULATORY_CARE_PROVIDER_SITE_OTHER): Payer: Self-pay | Admitting: Orthopedic Surgery

## 2017-06-13 VITALS — Ht 68.0 in | Wt 202.0 lb

## 2017-06-13 DIAGNOSIS — L97521 Non-pressure chronic ulcer of other part of left foot limited to breakdown of skin: Secondary | ICD-10-CM
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
## 2017-06-19 ENCOUNTER — Encounter: Payer: Self-pay | Admitting: Family Medicine

## 2017-06-19 NOTE — Progress Notes (Signed)
[facility] Ophthalmology Eye exam See scanned chart summary

## 2017-06-25 DIAGNOSIS — T63461D Toxic effect of venom of wasps, accidental (unintentional), subsequent encounter: Secondary | ICD-10-CM | POA: Diagnosis not present

## 2017-06-25 DIAGNOSIS — Z8546 Personal history of malignant neoplasm of prostate: Secondary | ICD-10-CM | POA: Diagnosis not present

## 2017-06-25 DIAGNOSIS — T63451D Toxic effect of venom of hornets, accidental (unintentional), subsequent encounter: Secondary | ICD-10-CM | POA: Diagnosis not present

## 2017-07-01 MED FILL — ALLOPURINOL 100 MG TABS: 100 | 90 days supply | Qty: 180 | Fill #3

## 2017-07-01 MED FILL — VESIcare 10 MG TABS: 10 | 30 days supply | Qty: 30 | Fill #1

## 2017-07-02 DIAGNOSIS — Z8546 Personal history of malignant neoplasm of prostate: Secondary | ICD-10-CM | POA: Diagnosis not present

## 2017-07-02 DIAGNOSIS — Z85528 Personal history of other malignant neoplasm of kidney: Secondary | ICD-10-CM | POA: Diagnosis not present

## 2017-07-02 DIAGNOSIS — N3946 Mixed incontinence: Secondary | ICD-10-CM | POA: Diagnosis not present

## 2017-07-05 ENCOUNTER — Encounter (INDEPENDENT_AMBULATORY_CARE_PROVIDER_SITE_OTHER): Payer: Self-pay | Admitting: Family

## 2017-07-05 ENCOUNTER — Ambulatory Visit (INDEPENDENT_AMBULATORY_CARE_PROVIDER_SITE_OTHER): Payer: 59 | Admitting: Family

## 2017-07-05 DIAGNOSIS — L98491 Non-pressure chronic ulcer of skin of other sites limited to breakdown of skin: Secondary | ICD-10-CM | POA: Diagnosis not present

## 2017-07-05 DIAGNOSIS — L97521 Non-pressure chronic ulcer of other part of left foot limited to breakdown of skin: Secondary | ICD-10-CM | POA: Diagnosis not present
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
## 2017-07-16 ENCOUNTER — Other Ambulatory Visit: Payer: Self-pay | Admitting: Family Medicine

## 2017-07-16 DIAGNOSIS — E785 Hyperlipidemia, unspecified: Secondary | ICD-10-CM

## 2017-07-16 DIAGNOSIS — I10 Essential (primary) hypertension: Secondary | ICD-10-CM

## 2017-07-16 MED FILL — AMLODIPINE BESYLATE 10 MG T: 10 | 90 days supply | Qty: 90 | Fill #2

## 2017-07-19 MED FILL — LISINOPRIL 20 MG TABLET: 20 | 90 days supply | Qty: 90 | Fill #0

## 2017-07-19 MED FILL — ROSUVASTATIN CALCIUM 10 MG: 10 | 90 days supply | Qty: 90 | Fill #0

## 2017-08-01 ENCOUNTER — Ambulatory Visit (INDEPENDENT_AMBULATORY_CARE_PROVIDER_SITE_OTHER): Payer: 59 | Admitting: Orthopedic Surgery

## 2017-08-01 ENCOUNTER — Encounter (INDEPENDENT_AMBULATORY_CARE_PROVIDER_SITE_OTHER): Payer: Self-pay | Admitting: Orthopedic Surgery

## 2017-08-01 DIAGNOSIS — L97524 Non-pressure chronic ulcer of other part of left foot with necrosis of bone: Secondary | ICD-10-CM | POA: Diagnosis not present

## 2017-08-01 DIAGNOSIS — M21372 Foot drop, left foot: Secondary | ICD-10-CM

## 2017-08-01 MED ORDER — HYDROCODONE-ACETAMINOPHEN 5-325 MG PO TABS: 1.0000 | ORAL_TABLET | Freq: Four times a day (QID) | ORAL | 0 refills | Status: DC | PRN
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
## 2017-08-05 ENCOUNTER — Ambulatory Visit (INDEPENDENT_AMBULATORY_CARE_PROVIDER_SITE_OTHER): Payer: 59 | Admitting: Orthopedic Surgery

## 2017-08-05 ENCOUNTER — Encounter (INDEPENDENT_AMBULATORY_CARE_PROVIDER_SITE_OTHER): Payer: Self-pay | Admitting: Orthopedic Surgery

## 2017-08-05 DIAGNOSIS — L97524 Non-pressure chronic ulcer of other part of left foot with necrosis of bone: Secondary | ICD-10-CM | POA: Diagnosis not present

## 2017-08-05 DIAGNOSIS — M21372 Foot drop, left foot: Secondary | ICD-10-CM | POA: Diagnosis not present
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
## 2017-08-06 MED FILL — VESIcare 10 MG TABS: 10 | 90 days supply | Qty: 90 | Fill #0

## 2017-08-07 DIAGNOSIS — T63461D Toxic effect of venom of wasps, accidental (unintentional), subsequent encounter: Secondary | ICD-10-CM | POA: Diagnosis not present

## 2017-08-07 DIAGNOSIS — T63451D Toxic effect of venom of hornets, accidental (unintentional), subsequent encounter: Secondary | ICD-10-CM | POA: Diagnosis not present

## 2017-08-12 ENCOUNTER — Encounter (INDEPENDENT_AMBULATORY_CARE_PROVIDER_SITE_OTHER): Payer: Self-pay | Admitting: Orthopedic Surgery

## 2017-08-12 ENCOUNTER — Ambulatory Visit (INDEPENDENT_AMBULATORY_CARE_PROVIDER_SITE_OTHER): Payer: 59 | Admitting: Orthopedic Surgery

## 2017-08-12 VITALS — Ht 68.0 in | Wt 202.0 lb

## 2017-08-12 DIAGNOSIS — L97524 Non-pressure chronic ulcer of other part of left foot with necrosis of bone: Secondary | ICD-10-CM
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
## 2017-08-21 MED FILL — MUPIROCIN 2% OINTMENT: 2 | 10 days supply | Qty: 22 | Fill #1

## 2017-08-26 ENCOUNTER — Ambulatory Visit (INDEPENDENT_AMBULATORY_CARE_PROVIDER_SITE_OTHER): Payer: 59 | Admitting: Orthopedic Surgery

## 2017-08-26 ENCOUNTER — Encounter (INDEPENDENT_AMBULATORY_CARE_PROVIDER_SITE_OTHER): Payer: Self-pay | Admitting: Orthopedic Surgery

## 2017-08-26 DIAGNOSIS — L97524 Non-pressure chronic ulcer of other part of left foot with necrosis of bone: Secondary | ICD-10-CM | POA: Diagnosis not present

## 2017-08-26 DIAGNOSIS — L97511 Non-pressure chronic ulcer of other part of right foot limited to breakdown of skin: Secondary | ICD-10-CM

## 2017-08-26 DIAGNOSIS — M21372 Foot drop, left foot: Secondary | ICD-10-CM | POA: Diagnosis not present

## 2017-08-26 MED ORDER — DOXYCYCLINE HYCLATE 100 MG PO TABS: 100.0000 mg | ORAL_TABLET | Freq: Two times a day (BID) | ORAL | 0 refills | Status: DC

## 2017-08-26 MED FILL — DOXYCYCLINE HYCLATE 100 MG: 100 | 30 days supply | Qty: 60 | Fill #0
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
## 2017-08-28 MED FILL — SERTRALINE HCL 100 MG TAB: 100 | 90 days supply | Qty: 180 | Fill #1

## 2017-09-03 ENCOUNTER — Encounter: Payer: 59 | Admitting: Family Medicine

## 2017-09-12 ENCOUNTER — Encounter (HOSPITAL_COMMUNITY): Payer: Self-pay | Admitting: *Deleted

## 2017-09-12 ENCOUNTER — Encounter (INDEPENDENT_AMBULATORY_CARE_PROVIDER_SITE_OTHER): Payer: Self-pay | Admitting: Orthopedic Surgery

## 2017-09-12 ENCOUNTER — Ambulatory Visit (INDEPENDENT_AMBULATORY_CARE_PROVIDER_SITE_OTHER): Payer: 59 | Admitting: Orthopedic Surgery

## 2017-09-12 ENCOUNTER — Other Ambulatory Visit: Payer: Self-pay

## 2017-09-12 ENCOUNTER — Other Ambulatory Visit (INDEPENDENT_AMBULATORY_CARE_PROVIDER_SITE_OTHER): Payer: Self-pay | Admitting: Family

## 2017-09-12 VITALS — Ht 68.0 in | Wt 202.0 lb

## 2017-09-12 DIAGNOSIS — T63441D Toxic effect of venom of bees, accidental (unintentional), subsequent encounter: Secondary | ICD-10-CM | POA: Diagnosis not present

## 2017-09-12 DIAGNOSIS — M7042 Prepatellar bursitis, left knee: Secondary | ICD-10-CM | POA: Diagnosis not present

## 2017-09-12 DIAGNOSIS — L97524 Non-pressure chronic ulcer of other part of left foot with necrosis of bone: Secondary | ICD-10-CM

## 2017-09-12 DIAGNOSIS — M86272 Subacute osteomyelitis, left ankle and foot: Secondary | ICD-10-CM

## 2017-09-12 DIAGNOSIS — T63461D Toxic effect of venom of wasps, accidental (unintentional), subsequent encounter: Secondary | ICD-10-CM | POA: Diagnosis not present

## 2017-09-12 DIAGNOSIS — T63451D Toxic effect of venom of hornets, accidental (unintentional), subsequent encounter: Secondary | ICD-10-CM | POA: Diagnosis not present

## 2017-09-12 MED ORDER — LIDOCAINE HCL 1 % IJ SOLN
5.0000 mL | INTRAMUSCULAR | Status: AC | PRN
  Administered 2017-09-12: 5 mL

## 2017-09-12 MED ORDER — METHYLPREDNISOLONE ACETATE 40 MG/ML IJ SUSP
40.0000 mg | INTRAMUSCULAR | Status: AC | PRN
  Administered 2017-09-12: 40 mg via INTRA_ARTICULAR

## 2017-09-12 NOTE — Progress Notes (Signed)
Spoke with pt for pre-op call. Pt denies cardiac history, chest pain, sob or diabetes. 
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
## 2017-09-13 ENCOUNTER — Encounter (HOSPITAL_COMMUNITY): Payer: Self-pay | Admitting: *Deleted

## 2017-09-13 ENCOUNTER — Ambulatory Visit (HOSPITAL_COMMUNITY): Payer: 59 | Admitting: Anesthesiology

## 2017-09-13 ENCOUNTER — Ambulatory Visit (HOSPITAL_COMMUNITY)
Admission: RE | Admit: 2017-09-13 | Discharge: 2017-09-13 | Disposition: A | Discharge status: Home / Self Care | Payer: 59 | Source: Ambulatory Visit | Attending: Orthopedic Surgery | Admitting: Orthopedic Surgery

## 2017-09-13 ENCOUNTER — Encounter (HOSPITAL_COMMUNITY)
Admission: RE | Disposition: A | Discharge status: Home / Self Care | Payer: Self-pay | Source: Ambulatory Visit | Attending: Orthopedic Surgery

## 2017-09-13 DIAGNOSIS — Z89422 Acquired absence of other left toe(s): Secondary | ICD-10-CM | POA: Diagnosis not present

## 2017-09-13 DIAGNOSIS — Z85528 Personal history of other malignant neoplasm of kidney: Secondary | ICD-10-CM | POA: Diagnosis not present

## 2017-09-13 DIAGNOSIS — L97524 Non-pressure chronic ulcer of other part of left foot with necrosis of bone: Secondary | ICD-10-CM | POA: Insufficient documentation

## 2017-09-13 DIAGNOSIS — Z832 Family history of diseases of the blood and blood-forming organs and certain disorders involving the immune mechanism: Secondary | ICD-10-CM | POA: Insufficient documentation

## 2017-09-13 DIAGNOSIS — Z87891 Personal history of nicotine dependence: Secondary | ICD-10-CM | POA: Insufficient documentation

## 2017-09-13 DIAGNOSIS — M48062 Spinal stenosis, lumbar region with neurogenic claudication: Secondary | ICD-10-CM | POA: Diagnosis not present

## 2017-09-13 DIAGNOSIS — Z79899 Other long term (current) drug therapy: Secondary | ICD-10-CM | POA: Insufficient documentation

## 2017-09-13 DIAGNOSIS — K219 Gastro-esophageal reflux disease without esophagitis: Secondary | ICD-10-CM | POA: Diagnosis not present

## 2017-09-13 DIAGNOSIS — Z7982 Long term (current) use of aspirin: Secondary | ICD-10-CM | POA: Diagnosis not present

## 2017-09-13 DIAGNOSIS — Z905 Acquired absence of kidney: Secondary | ICD-10-CM | POA: Diagnosis not present

## 2017-09-13 DIAGNOSIS — M86272 Subacute osteomyelitis, left ankle and foot: Secondary | ICD-10-CM

## 2017-09-13 DIAGNOSIS — Z8546 Personal history of malignant neoplasm of prostate: Secondary | ICD-10-CM | POA: Insufficient documentation

## 2017-09-13 DIAGNOSIS — M869 Osteomyelitis, unspecified: Secondary | ICD-10-CM | POA: Diagnosis not present

## 2017-09-13 DIAGNOSIS — Z881 Allergy status to other antibiotic agents status: Secondary | ICD-10-CM | POA: Insufficient documentation

## 2017-09-13 DIAGNOSIS — Z91030 Bee allergy status: Secondary | ICD-10-CM | POA: Diagnosis not present

## 2017-09-13 DIAGNOSIS — E7800 Pure hypercholesterolemia, unspecified: Secondary | ICD-10-CM | POA: Insufficient documentation

## 2017-09-13 DIAGNOSIS — M1990 Unspecified osteoarthritis, unspecified site: Secondary | ICD-10-CM | POA: Insufficient documentation

## 2017-09-13 DIAGNOSIS — I10 Essential (primary) hypertension: Secondary | ICD-10-CM | POA: Diagnosis not present

## 2017-09-13 DIAGNOSIS — C61 Malignant neoplasm of prostate: Secondary | ICD-10-CM | POA: Diagnosis not present

## 2017-09-13 DIAGNOSIS — Z85520 Personal history of malignant carcinoid tumor of kidney: Secondary | ICD-10-CM | POA: Insufficient documentation

## 2017-09-13 DIAGNOSIS — F419 Anxiety disorder, unspecified: Secondary | ICD-10-CM | POA: Insufficient documentation

## 2017-09-13 HISTORY — DX: Anxiety disorder, unspecified: F41.9

## 2017-09-13 HISTORY — PX: AMPUTATION: SHX166

## 2017-09-13 LAB — BASIC METABOLIC PANEL WITH GFR
Anion gap: 10 (ref 5–15)
BUN: 26 mg/dL — ABNORMAL HIGH (ref 6–20)
CO2: 21 mmol/L — ABNORMAL LOW (ref 22–32)
Calcium: 9.2 mg/dL (ref 8.9–10.3)
Chloride: 107 mmol/L (ref 101–111)
Creatinine, Ser: 1.22 mg/dL (ref 0.61–1.24)
GFR calc Af Amer: >60 mL/min
GFR calc non Af Amer: >60 mL/min
Glucose, Bld: 119 mg/dL — ABNORMAL HIGH (ref 65–99)
Potassium: 4.9 mmol/L (ref 3.5–5.1)
Sodium: 138 mmol/L (ref 135–145)

## 2017-09-13 LAB — CBC
HCT: 39.9 % (ref 39.0–52.0)
Hemoglobin: 13 g/dL (ref 13.0–17.0)
MCH: 29.4 pg (ref 26.0–34.0)
MCHC: 32.6 g/dL (ref 30.0–36.0)
MCV: 90.3 fL (ref 78.0–100.0)
Platelets: 216 10*3/uL (ref 150–400)
RBC: 4.42 MIL/uL (ref 4.22–5.81)
RDW: 14.5 % (ref 11.5–15.5)
WBC: 7 10*3/uL (ref 4.0–10.5)

## 2017-09-13 SURGERY — AMPUTATION, FOOT, RAY
Anesthesia: General | Laterality: Left

## 2017-09-13 MED ORDER — LACTATED RINGERS IV SOLN: INTRAVENOUS | Status: DC

## 2017-09-13 MED ORDER — BUPIVACAINE HCL (PF) 0.25 % IJ SOLN
INTRAMUSCULAR | Status: AC
Start: 2017-09-13 — End: 2017-09-13
  Filled 2017-09-13: qty 30

## 2017-09-13 MED ORDER — FENTANYL CITRATE (PF) 100 MCG/2ML IJ SOLN
25.0000 ug | INTRAMUSCULAR | Status: DC | PRN
  Administered 2017-09-13 (×2): 50 ug via INTRAVENOUS

## 2017-09-13 MED ORDER — CHLORHEXIDINE GLUCONATE 4 % EX LIQD: 60.0000 mL | Freq: Once | CUTANEOUS | Status: DC

## 2017-09-13 MED ORDER — MIDAZOLAM HCL 5 MG/5ML IJ SOLN
INTRAMUSCULAR | Status: DC | PRN
  Administered 2017-09-13: 2 mg via INTRAVENOUS

## 2017-09-13 MED ORDER — BUPIVACAINE HCL 0.25 % IJ SOLN
INTRAMUSCULAR | Status: DC | PRN
Start: 2017-09-13 — End: 2017-09-13
  Administered 2017-09-13: 20 mL

## 2017-09-13 MED ORDER — PROPOFOL 10 MG/ML IV BOLUS
INTRAVENOUS | Status: AC
  Filled 2017-09-13: qty 20

## 2017-09-13 MED ORDER — MIDAZOLAM HCL 2 MG/2ML IJ SOLN
INTRAMUSCULAR | Status: AC
  Filled 2017-09-13: qty 2

## 2017-09-13 MED ORDER — ONDANSETRON HCL 4 MG/2ML IJ SOLN
INTRAMUSCULAR | Status: DC | PRN
  Administered 2017-09-13: 4 mg via INTRAVENOUS

## 2017-09-13 MED ORDER — LACTATED RINGERS IV SOLN
INTRAVENOUS | Status: DC
  Administered 2017-09-13: 50 mL/h via INTRAVENOUS

## 2017-09-13 MED ORDER — FENTANYL CITRATE (PF) 100 MCG/2ML IJ SOLN
INTRAMUSCULAR | Status: DC | PRN
  Administered 2017-09-13: 50 ug via INTRAVENOUS

## 2017-09-13 MED ORDER — LIDOCAINE 2% (20 MG/ML) 5 ML SYRINGE
INTRAMUSCULAR | Status: AC
  Filled 2017-09-13: qty 5

## 2017-09-13 MED ORDER — OXYCODONE-ACETAMINOPHEN 5-325 MG PO TABS
1.0000 | ORAL_TABLET | ORAL | Status: AC | PRN
  Administered 2017-09-13: 1 via ORAL

## 2017-09-13 MED ORDER — OXYCODONE-ACETAMINOPHEN 5-325 MG PO TABS: 1.0000 | ORAL_TABLET | ORAL | 0 refills | Status: DC | PRN

## 2017-09-13 MED ORDER — ONDANSETRON HCL 4 MG/2ML IJ SOLN
INTRAMUSCULAR | Status: AC
  Filled 2017-09-13: qty 2

## 2017-09-13 MED ORDER — 0.9 % SODIUM CHLORIDE (POUR BTL) OPTIME
TOPICAL | Status: DC | PRN
  Administered 2017-09-13: 1000 mL

## 2017-09-13 MED ORDER — PHENYLEPHRINE HCL 10 MG/ML IJ SOLN
INTRAMUSCULAR | Status: DC | PRN
  Administered 2017-09-13: 120 ug via INTRAVENOUS

## 2017-09-13 MED ORDER — LIDOCAINE 2% (20 MG/ML) 5 ML SYRINGE
INTRAMUSCULAR | Status: DC | PRN
  Administered 2017-09-13: 100 mg via INTRAVENOUS

## 2017-09-13 MED ORDER — FENTANYL CITRATE (PF) 100 MCG/2ML IJ SOLN
INTRAMUSCULAR | Status: AC
  Administered 2017-09-13: 50 ug via INTRAVENOUS
  Filled 2017-09-13: qty 2

## 2017-09-13 MED ORDER — PHENYLEPHRINE 40 MCG/ML (10ML) SYRINGE FOR IV PUSH (FOR BLOOD PRESSURE SUPPORT)
PREFILLED_SYRINGE | INTRAVENOUS | Status: AC
  Filled 2017-09-13: qty 10

## 2017-09-13 MED ORDER — MEPERIDINE HCL 50 MG/ML IJ SOLN: 6.2500 mg | INTRAMUSCULAR | Status: DC | PRN

## 2017-09-13 MED ORDER — CEFAZOLIN SODIUM-DEXTROSE 2-4 GM/100ML-% IV SOLN
2.0000 g | INTRAVENOUS | Status: AC
  Administered 2017-09-13: 2 g via INTRAVENOUS

## 2017-09-13 MED ORDER — CEFAZOLIN SODIUM-DEXTROSE 2-4 GM/100ML-% IV SOLN
INTRAVENOUS | Status: AC
  Filled 2017-09-13: qty 100

## 2017-09-13 MED ORDER — OXYCODONE-ACETAMINOPHEN 5-325 MG PO TABS
ORAL_TABLET | ORAL | Status: AC
  Administered 2017-09-13: 1 via ORAL
  Filled 2017-09-13: qty 1

## 2017-09-13 MED ORDER — PROPOFOL 10 MG/ML IV BOLUS
INTRAVENOUS | Status: DC | PRN
  Administered 2017-09-13: 200 mg via INTRAVENOUS

## 2017-09-13 MED ORDER — METOCLOPRAMIDE HCL 5 MG/ML IJ SOLN: 10.0000 mg | Freq: Once | INTRAMUSCULAR | Status: DC | PRN

## 2017-09-13 MED ORDER — FENTANYL CITRATE (PF) 250 MCG/5ML IJ SOLN
INTRAMUSCULAR | Status: AC
  Filled 2017-09-13: qty 5

## 2017-09-13 SURGICAL SUPPLY — 32 items
BLADE SAW SGTL MED 73X18.5 STR (BLADE) IMPLANT
BLADE SURG 21 STRL SS (BLADE) ×3 IMPLANT
BNDG COHESIVE 4X5 TAN STRL (GAUZE/BANDAGES/DRESSINGS) ×3 IMPLANT
BNDG GAUZE ELAST 4 BULKY (GAUZE/BANDAGES/DRESSINGS) ×6 IMPLANT
COVER SURGICAL LIGHT HANDLE (MISCELLANEOUS) ×6 IMPLANT
DRAPE U-SHAPE 47X51 STRL (DRAPES) ×6 IMPLANT
DRSG ADAPTIC 3X8 NADH LF (GAUZE/BANDAGES/DRESSINGS) ×3 IMPLANT
DRSG PAD ABDOMINAL 8X10 ST (GAUZE/BANDAGES/DRESSINGS) ×6 IMPLANT
DURAPREP 26ML APPLICATOR (WOUND CARE) ×3 IMPLANT
ELECT REM PT RETURN 9FT ADLT (ELECTROSURGICAL) ×3
ELECTRODE REM PT RTRN 9FT ADLT (ELECTROSURGICAL) ×1 IMPLANT
GAUZE SPONGE 4X4 12PLY STRL (GAUZE/BANDAGES/DRESSINGS) ×3 IMPLANT
GLOVE BIOGEL PI IND STRL 9 (GLOVE) ×1 IMPLANT
GLOVE BIOGEL PI INDICATOR 9 (GLOVE) ×2
GLOVE INDICATOR 7.0 STRL GRN (GLOVE) ×9 IMPLANT
GLOVE SURG ORTHO 9.0 STRL STRW (GLOVE) ×3 IMPLANT
GLOVE SURG SS PI 7.0 STRL IVOR (GLOVE) ×9 IMPLANT
GOWN STRL REUS W/ TWL XL LVL3 (GOWN DISPOSABLE) ×2 IMPLANT
GOWN STRL REUS W/TWL XL LVL3 (GOWN DISPOSABLE) ×4
KIT BASIN OR (CUSTOM PROCEDURE TRAY) ×3 IMPLANT
KIT ROOM TURNOVER OR (KITS) ×3 IMPLANT
NS IRRIG 1000ML POUR BTL (IV SOLUTION) ×3 IMPLANT
PACK ORTHO EXTREMITY (CUSTOM PROCEDURE TRAY) ×3 IMPLANT
PAD ABD 8X10 STRL (GAUZE/BANDAGES/DRESSINGS) ×6 IMPLANT
PAD ARMBOARD 7.5X6 YLW CONV (MISCELLANEOUS) ×6 IMPLANT
STOCKINETTE IMPERVIOUS LG (DRAPES) IMPLANT
SUT ETHILON 2 0 PSLX (SUTURE) ×3 IMPLANT
SYR CONTROL 10ML LL (SYRINGE) ×3 IMPLANT
TOWEL OR 17X26 10 PK STRL BLUE (TOWEL DISPOSABLE) ×3 IMPLANT
TUBE CONNECTING 12'X1/4 (SUCTIONS) ×1
TUBE CONNECTING 12X1/4 (SUCTIONS) ×2 IMPLANT
YANKAUER SUCT BULB TIP NO VENT (SUCTIONS) IMPLANT

## 2017-09-13 NOTE — Anesthesia Preprocedure Evaluation (Signed)
Anesthesia Evaluation  Patient identified by MRN, date of birth, ID band Patient awake    Reviewed: Allergy & Precautions, NPO status , Patient's Chart, lab work & pertinent test results  Airway Mallampati: I  TM Distance: >3 FB Neck ROM: Full    Dental  (+) Teeth Intact, Dental Advisory Given, Chipped,    Pulmonary former smoker,    Pulmonary exam normal        Cardiovascular hypertension, Pt. on medications Normal cardiovascular exam Rhythm:Regular Rate:Normal     Neuro/Psych Left foot drop    GI/Hepatic GERD  Medicated and Controlled,  Endo/Other    Renal/GU Renal diseaseS/P nephrectomy for renal cell CA Cr 1.4     Musculoskeletal  (+) Arthritis , Osteoarthritis,    Abdominal   Peds  Hematology   Anesthesia Other Findings   Reproductive/Obstetrics                             Anesthesia Physical  Anesthesia Plan  ASA: II  Anesthesia Plan: General   Post-op Pain Management:    Induction: Intravenous  PONV Risk Score and Plan: 1 and Ondansetron  Airway Management Planned: LMA  Additional Equipment:   Intra-op Plan:   Post-operative Plan: Extubation in OR  Informed Consent: I have reviewed the patients History and Physical, chart, labs and discussed the procedure including the risks, benefits and alternatives for the proposed anesthesia with the patient or authorized representative who has indicated his/her understanding and acceptance.     Plan Discussed with: CRNA and Surgeon  Anesthesia Plan Comments:         Anesthesia Quick Evaluation

## 2017-09-13 NOTE — Progress Notes (Signed)
Orthopedic Tech Progress Note Patient Details:  Taylor Elliott 04-17-1951 [id number]  Ortho Devices Type of Ortho Device: CAM walker Ortho Device/Splint Interventions: Application   Post Interventions Instructions Provided: Care of device   [name] 09/13/2017, 10:42 AM

## 2017-09-13 NOTE — Transfer of Care (Signed)
Immediate Anesthesia Transfer of Care Note  Patient: Taylor Elliott  Procedure(s) Performed: LEFT FOOT 4TH RAY AMPUTATION (Left )  Patient Location: PACU  Anesthesia Type:General  Level of Consciousness: awake, alert  and oriented  Airway & Oxygen Therapy: Patient Spontanous Breathing and Patient connected to face mask oxygen  Post-op Assessment: Report given to RN and Post -op Vital signs reviewed and stable  Post vital signs: Reviewed and stable  Last Vitals:  Vitals:   09/13/17 0815 09/13/17 0940  BP: (!) 134/58 93/63  Pulse: 74 73  Resp: 20 10  Temp: 36.8 C (!) (P) 36.3 C  SpO2: 99% 99%    Last Pain:  Vitals:   09/13/17 0847  TempSrc:   PainSc: 4       Patients Stated Pain Goal: 3 (44/36/01 6580)  Complications: No apparent anesthesia complications

## 2017-09-13 NOTE — Anesthesia Postprocedure Evaluation (Signed)
Anesthesia Post Note  Patient: Taylor Elliott  Procedure(s) Performed: LEFT FOOT 4TH RAY AMPUTATION (Left )     Patient location during evaluation: PACU Anesthesia Type: General Level of consciousness: awake and alert Pain management: pain level controlled Vital Signs Assessment: post-procedure vital signs reviewed and stable Respiratory status: spontaneous breathing, nonlabored ventilation, respiratory function stable and patient connected to nasal cannula oxygen Cardiovascular status: blood pressure returned to baseline and stable Postop Assessment: no apparent nausea or vomiting Anesthetic complications: no    Last Vitals:  Vitals:   09/13/17 1040 09/13/17 1100  BP: 135/72   Pulse:    Resp: 18   Temp: (!) 36.3 C (!) 36.3 C  SpO2: 95%     Last Pain:  Vitals:   09/13/17 1040  TempSrc:   PainSc: 2                  Montez Hageman

## 2017-09-13 NOTE — Op Note (Signed)
09/13/2017  9:39 AM  PATIENT:  Taylor Elliott    PRE-OPERATIVE DIAGNOSIS:  Osteomyelitis 4th Metatarsal Head Left Foot  POST-OPERATIVE DIAGNOSIS:  Same  PROCEDURE:  LEFT FOOT 4TH RAY AMPUTATION Local tissue rearrangement for wound closure second by 3 cm.  SURGEON:  Newt Minion, MD  PHYSICIAN ASSISTANT:None ANESTHESIA:   General  PREOPERATIVE INDICATIONS:  Taylor Elliott is a  67 y.o. male with a diagnosis of Osteomyelitis 4th Metatarsal Head Left Foot who failed conservative measures and elected for surgical management.    The risks benefits and alternatives were discussed with the patient preoperatively including but not limited to the risks of infection, bleeding, nerve injury, cardiopulmonary complications, the need for revision surgery, among others, and the patient was willing to proceed.  OPERATIVE IMPLANTS: None  OPERATIVE FINDINGS: No abscess at the level of amputation  OPERATIVE PROCEDURE: Patient was brought the operating room underwent a general anesthetic.  After adequate levels of anesthesia were obtained patient's left lower extremity was prepped using DuraPrep draped in the sterile field a timeout was called.  A rotational flap incision was made over the lateral aspect of his foot to incorporate the ulcer and the infected bone and one block of tissue.  The metatarsal toe and ulcer were resected in one block of tissue.  Electrocautery was used for hemostasis.  The wound was irrigated with normal saline there is no signs of infection at the resection margins.  A local rotation flap was performed to close the wound 3 x 7 cm with  rotating the dorsal flap.  This was closed using 2-0 nylon.  A sterile compressive dressing was applied patient was extubated taken the PACU in stable condition.   DISCHARGE PLANNING:  Antibiotic duration: Perioperatively  Weightbearing: Nonweightbearing on the left  Pain medication: Percocet  Dressing care/ Wound VAC: Leave dressing in place  until follow-up in 1 week  Ambulatory devices: Crutches.  Discharge to:  home  Follow-up: In the office 1 week post operative.
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
## 2017-09-13 NOTE — Anesthesia Procedure Notes (Signed)
Procedure Name: LMA Insertion Date/Time: 09/13/2017 9:19 AM Performed by: Babs Bertin, CRNA Pre-anesthesia Checklist: Patient identified, Emergency Drugs available, Suction available and Patient being monitored Patient Re-evaluated:Patient Re-evaluated prior to induction Oxygen Delivery Method: Circle System Utilized Preoxygenation: Pre-oxygenation with 100% oxygen Induction Type: IV induction Ventilation: Mask ventilation without difficulty LMA: LMA inserted LMA Size: 4.0 Number of attempts: 1 Airway Equipment and Method: Bite block Placement Confirmation: positive ETCO2 Tube secured with: Tape Dental Injury: Teeth and Oropharynx as per pre-operative assessment

## 2017-09-14 ENCOUNTER — Encounter (HOSPITAL_COMMUNITY): Payer: Self-pay | Admitting: Orthopedic Surgery

## 2017-09-19 ENCOUNTER — Ambulatory Visit (INDEPENDENT_AMBULATORY_CARE_PROVIDER_SITE_OTHER): Payer: 59 | Admitting: Orthopedic Surgery

## 2017-09-19 ENCOUNTER — Encounter (INDEPENDENT_AMBULATORY_CARE_PROVIDER_SITE_OTHER): Payer: Self-pay | Admitting: Orthopedic Surgery

## 2017-09-19 VITALS — Ht 68.0 in | Wt 202.0 lb

## 2017-09-19 DIAGNOSIS — Z89422 Acquired absence of other left toe(s): Secondary | ICD-10-CM
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
## 2017-09-26 ENCOUNTER — Ambulatory Visit (INDEPENDENT_AMBULATORY_CARE_PROVIDER_SITE_OTHER): Payer: 59 | Admitting: Orthopedic Surgery

## 2017-09-30 ENCOUNTER — Ambulatory Visit (INDEPENDENT_AMBULATORY_CARE_PROVIDER_SITE_OTHER): Payer: 59 | Admitting: Orthopedic Surgery

## 2017-09-30 ENCOUNTER — Encounter (INDEPENDENT_AMBULATORY_CARE_PROVIDER_SITE_OTHER): Payer: Self-pay | Admitting: Orthopedic Surgery

## 2017-09-30 VITALS — Ht 68.0 in | Wt 202.0 lb

## 2017-09-30 DIAGNOSIS — Z89422 Acquired absence of other left toe(s): Secondary | ICD-10-CM

## 2017-09-30 MED ORDER — ALLOPURINOL 100 MG PO TABS: 100.0000 mg | ORAL_TABLET | Freq: Two times a day (BID) | ORAL | 3 refills | Status: DC

## 2017-09-30 MED FILL — ALLOPURINOL 100 MG TABS: 100 | 90 days supply | Qty: 180 | Fill #0

## 2017-09-30 NOTE — Addendum Note (Signed)
Addended by: Meridee Score on: 09/30/2017 09:18 AM   Modules accepted: Orders
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
## 2017-10-04 ENCOUNTER — Encounter (INDEPENDENT_AMBULATORY_CARE_PROVIDER_SITE_OTHER): Payer: Self-pay | Admitting: Orthopedic Surgery

## 2017-10-04 ENCOUNTER — Ambulatory Visit (INDEPENDENT_AMBULATORY_CARE_PROVIDER_SITE_OTHER): Payer: 59 | Admitting: Orthopedic Surgery

## 2017-10-04 VITALS — Ht 68.0 in | Wt 202.0 lb

## 2017-10-04 DIAGNOSIS — Z89422 Acquired absence of other left toe(s): Secondary | ICD-10-CM
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
## 2017-10-14 ENCOUNTER — Encounter (INDEPENDENT_AMBULATORY_CARE_PROVIDER_SITE_OTHER): Payer: Self-pay | Admitting: Orthopedic Surgery

## 2017-10-14 ENCOUNTER — Ambulatory Visit (INDEPENDENT_AMBULATORY_CARE_PROVIDER_SITE_OTHER): Payer: 59 | Admitting: Orthopedic Surgery

## 2017-10-14 VITALS — Ht 68.0 in | Wt 202.0 lb

## 2017-10-14 DIAGNOSIS — T63441D Toxic effect of venom of bees, accidental (unintentional), subsequent encounter: Secondary | ICD-10-CM | POA: Diagnosis not present

## 2017-10-14 DIAGNOSIS — Z89422 Acquired absence of other left toe(s): Secondary | ICD-10-CM

## 2017-10-14 DIAGNOSIS — T63451D Toxic effect of venom of hornets, accidental (unintentional), subsequent encounter: Secondary | ICD-10-CM | POA: Diagnosis not present

## 2017-10-14 DIAGNOSIS — T63461D Toxic effect of venom of wasps, accidental (unintentional), subsequent encounter: Secondary | ICD-10-CM | POA: Diagnosis not present
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
## 2017-10-23 ENCOUNTER — Telehealth (INDEPENDENT_AMBULATORY_CARE_PROVIDER_SITE_OTHER): Payer: Self-pay

## 2017-10-23 NOTE — Telephone Encounter (Signed)
Paper work received will have Dr. Sharol Given to sign tomorrow.

## 2017-10-23 NOTE — Telephone Encounter (Signed)
Patient called stating that [facility] in [facility] needs to have signed authorization papers for his brace and orthotic.  Cb# is [phone].  Please advise.  Thank you.

## 2017-10-28 ENCOUNTER — Telehealth (INDEPENDENT_AMBULATORY_CARE_PROVIDER_SITE_OTHER): Payer: Self-pay | Admitting: Orthopedic Surgery

## 2017-10-28 NOTE — Telephone Encounter (Signed)
Last 2 ov notes faxed to [facility] along with CMN [phone]

## 2017-10-30 ENCOUNTER — Ambulatory Visit (INDEPENDENT_AMBULATORY_CARE_PROVIDER_SITE_OTHER): Payer: 59 | Admitting: Orthopedic Surgery

## 2017-10-31 ENCOUNTER — Encounter: Payer: Self-pay | Admitting: Family Medicine

## 2017-10-31 ENCOUNTER — Ambulatory Visit (INDEPENDENT_AMBULATORY_CARE_PROVIDER_SITE_OTHER): Payer: 59 | Admitting: Orthopedic Surgery

## 2017-10-31 ENCOUNTER — Ambulatory Visit (INDEPENDENT_AMBULATORY_CARE_PROVIDER_SITE_OTHER): Payer: 59 | Admitting: Family Medicine

## 2017-10-31 ENCOUNTER — Encounter (INDEPENDENT_AMBULATORY_CARE_PROVIDER_SITE_OTHER): Payer: Self-pay | Admitting: Orthopedic Surgery

## 2017-10-31 VITALS — Ht 68.0 in | Wt 202.0 lb

## 2017-10-31 VITALS — BP 136/60 | HR 97 | Resp 16 | Ht 68.0 in | Wt 214.2 lb

## 2017-10-31 DIAGNOSIS — L97511 Non-pressure chronic ulcer of other part of right foot limited to breakdown of skin: Secondary | ICD-10-CM

## 2017-10-31 DIAGNOSIS — I10 Essential (primary) hypertension: Secondary | ICD-10-CM | POA: Diagnosis not present

## 2017-10-31 DIAGNOSIS — M1A0720 Idiopathic chronic gout, left ankle and foot, without tophus (tophi): Secondary | ICD-10-CM | POA: Diagnosis not present

## 2017-10-31 DIAGNOSIS — C61 Malignant neoplasm of prostate: Secondary | ICD-10-CM | POA: Diagnosis not present

## 2017-10-31 DIAGNOSIS — Z89422 Acquired absence of other left toe(s): Secondary | ICD-10-CM

## 2017-10-31 DIAGNOSIS — Z0000 Encounter for general adult medical examination without abnormal findings: Secondary | ICD-10-CM | POA: Diagnosis not present

## 2017-10-31 DIAGNOSIS — E785 Hyperlipidemia, unspecified: Secondary | ICD-10-CM

## 2017-10-31 DIAGNOSIS — Z1159 Encounter for screening for other viral diseases: Secondary | ICD-10-CM | POA: Diagnosis not present

## 2017-10-31 NOTE — Assessment & Plan Note (Signed)
Per u rology 

## 2017-10-31 NOTE — Patient Instructions (Signed)
Preventive Care 66 Years and Older, Male Preventive care refers to lifestyle choices and visits with your health care provider that can promote health and wellness. What does preventive care include?  A yearly physical exam. This is also called an annual well check.  Dental exams once or twice a year.  Routine eye exams. Ask your health care provider how often you should have your eyes checked.  Personal lifestyle choices, including: ? Daily care of your teeth and gums. ? Regular physical activity. ? Eating a healthy diet. ? Avoiding tobacco and drug use. ? Limiting alcohol use. ? Practicing safe sex. ? Taking low doses of aspirin every day. ? Taking vitamin and mineral supplements as recommended by your health care provider. What happens during an annual well check? The services and screenings done by your health care provider during your annual well check will depend on your age, overall health, lifestyle risk factors, and family history of disease. Counseling Your health care provider may ask you questions about your:  Alcohol use.  Tobacco use.  Drug use.  Emotional well-being.  Home and relationship well-being.  Sexual activity.  Eating habits.  History of falls.  Memory and ability to understand (cognition).  Work and work environment.  Screening You may have the following tests or measurements:  Height, weight, and BMI.  Blood pressure.  Lipid and cholesterol levels. These may be checked every 5 years, or more frequently if you are over 50 years old.  Skin check.  Lung cancer screening. You may have this screening every year starting at age 55 if you have a 30-pack-year history of smoking and currently smoke or have quit within the past 15 years.  Fecal occult blood test (FOBT) of the stool. You may have this test every year starting at age 50.  Flexible sigmoidoscopy or colonoscopy. You may have a sigmoidoscopy every 5 years or a colonoscopy every 10  years starting at age 50.  Prostate cancer screening. Recommendations will vary depending on your family history and other risks.  Hepatitis C blood test.  Hepatitis B blood test.  Sexually transmitted disease (STD) testing.  Diabetes screening. This is done by checking your blood sugar (glucose) after you have not eaten for a while (fasting). You may have this done every 1-3 years.  Abdominal aortic aneurysm (AAA) screening. You may need this if you are a current or former smoker.  Osteoporosis. You may be screened starting at age 70 if you are at high risk.  Talk with your health care provider about your test results, treatment options, and if necessary, the need for more tests. Vaccines Your health care provider may recommend certain vaccines, such as:  Influenza vaccine. This is recommended every year.  Tetanus, diphtheria, and acellular pertussis (Tdap, Td) vaccine. You may need a Td booster every 10 years.  Varicella vaccine. You may need this if you have not been vaccinated.  Zoster vaccine. You may need this after age 60.  Measles, mumps, and rubella (MMR) vaccine. You may need at least one dose of MMR if you were born in 1957 or later. You may also need a second dose.  Pneumococcal 13-valent conjugate (PCV13) vaccine. One dose is recommended after age 65.  Pneumococcal polysaccharide (PPSV23) vaccine. One dose is recommended after age 65.  Meningococcal vaccine. You may need this if you have certain conditions.  Hepatitis A vaccine. You may need this if you have certain conditions or if you travel or work in places where you   may be exposed to hepatitis A.  Hepatitis B vaccine. You may need this if you have certain conditions or if you travel or work in places where you may be exposed to hepatitis B.  Haemophilus influenzae type b (Hib) vaccine. You may need this if you have certain risk factors.  Talk to your health care provider about which screenings and vaccines  you need and how often you need them. This information is not intended to replace advice given to you by your health care provider. Make sure you discuss any questions you have with your health care provider. Document Released: 08/12/2015 Document Revised: 04/04/2016 Document Reviewed: 05/17/2015 Elsevier Interactive Patient Education  2018 Elsevier Inc.  
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
## 2017-10-31 NOTE — Assessment & Plan Note (Signed)
ghm utd Check labs See AVS 
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
## 2017-10-31 NOTE — Assessment & Plan Note (Signed)
Well controlled, no changes to meds. Encouraged heart healthy diet such as the DASH diet and exercise as tolerated.  °

## 2017-11-01 LAB — COMPREHENSIVE METABOLIC PANEL
ALT: 15 U/L (ref 0–53)
AST: 14 U/L (ref 0–37)
Albumin: 3.9 g/dL (ref 3.5–5.2)
Alkaline Phosphatase: 120 U/L — ABNORMAL HIGH (ref 39–117)
BUN: 19 mg/dL (ref 6–23)
CO2: 24 mEq/L (ref 19–32)
Calcium: 9 mg/dL (ref 8.4–10.5)
Chloride: 105 mEq/L (ref 96–112)
Creatinine, Ser: 1.15 mg/dL (ref 0.40–1.50)
GFR: 67.72 mL/min (ref >60.00)
Glucose, Bld: 86 mg/dL (ref 70–99)
Potassium: 4.5 mEq/L (ref 3.5–5.1)
Sodium: 137 mEq/L (ref 135–145)
Total Bilirubin: 0.2 mg/dL (ref 0.2–1.2)
Total Protein: 6.9 g/dL (ref 6.0–8.3)

## 2017-11-01 LAB — HEPATITIS C ANTIBODY
Hepatitis C Ab: NONREACTIVE
SIGNAL TO CUT-OFF: 0.01 (ref <1.00)

## 2017-11-01 LAB — CBC WITH DIFFERENTIAL/PLATELET
Basophils Absolute: 0.1 10*3/uL (ref 0.0–0.1)
Basophils Relative: 1.2 % (ref 0.0–3.0)
Eosinophils Absolute: 0.2 10*3/uL (ref 0.0–0.7)
Eosinophils Relative: 3 % (ref 0.0–5.0)
HCT: 38.6 % — ABNORMAL LOW (ref 39.0–52.0)
Hemoglobin: 13 g/dL (ref 13.0–17.0)
Lymphocytes Relative: 11.9 % — ABNORMAL LOW (ref 12.0–46.0)
Lymphs Abs: 0.7 10*3/uL (ref 0.7–4.0)
MCHC: 33.6 g/dL (ref 30.0–36.0)
MCV: 88.6 fl (ref 78.0–100.0)
Monocytes Absolute: 0.5 10*3/uL (ref 0.1–1.0)
Monocytes Relative: 8.1 % (ref 3.0–12.0)
Neutro Abs: 4.6 10*3/uL (ref 1.4–7.7)
Neutrophils Relative %: 75.8 % (ref 43.0–77.0)
Platelets: 250 10*3/uL (ref 150.0–400.0)
RBC: 4.36 Mil/uL (ref 4.22–5.81)
RDW: 16 % — ABNORMAL HIGH (ref 11.5–15.5)
WBC: 6.1 10*3/uL (ref 4.0–10.5)

## 2017-11-01 LAB — LIPID PANEL
Cholesterol: 143 mg/dL (ref 0–200)
HDL: 35.3 mg/dL — ABNORMAL LOW (ref >39.00)
LDL Cholesterol: 72 mg/dL (ref 0–99)
NonHDL: 107.84
Total CHOL/HDL Ratio: 4
Triglycerides: 177 mg/dL — ABNORMAL HIGH (ref 0.0–149.0)
VLDL: 35.4 mg/dL (ref 0.0–40.0)

## 2017-11-01 LAB — TSH: TSH: 3.09 u[IU]/mL (ref 0.35–4.50)

## 2017-11-01 LAB — URIC ACID: Uric Acid, Serum: 5.1 mg/dL (ref 4.0–7.8)

## 2017-11-04 ENCOUNTER — Other Ambulatory Visit: Payer: Self-pay | Admitting: Family Medicine

## 2017-11-04 DIAGNOSIS — I10 Essential (primary) hypertension: Secondary | ICD-10-CM

## 2017-11-04 MED FILL — LISINOPRIL 20 MG TABLET: 20 | 90 days supply | Qty: 90 | Fill #1

## 2017-11-04 MED FILL — ROSUVASTATIN CALCIUM 10 MG: 10 | 90 days supply | Qty: 90 | Fill #1

## 2017-11-04 MED FILL — VESIcare 10 MG TABS: 10 | 90 days supply | Qty: 90 | Fill #1

## 2017-11-05 MED FILL — AMLODIPINE BESYLATE 10 MG T: 10 | 90 days supply | Qty: 90 | Fill #0

## 2017-11-07 DIAGNOSIS — M21371 Foot drop, right foot: Secondary | ICD-10-CM | POA: Diagnosis not present

## 2017-11-07 DIAGNOSIS — S98212D Complete traumatic amputation of two or more left lesser toes, subsequent encounter: Secondary | ICD-10-CM | POA: Diagnosis not present

## 2017-11-07 DIAGNOSIS — M21372 Foot drop, left foot: Secondary | ICD-10-CM | POA: Diagnosis not present

## 2017-11-13 DIAGNOSIS — T63451D Toxic effect of venom of hornets, accidental (unintentional), subsequent encounter: Secondary | ICD-10-CM | POA: Diagnosis not present

## 2017-11-13 DIAGNOSIS — T63461D Toxic effect of venom of wasps, accidental (unintentional), subsequent encounter: Secondary | ICD-10-CM | POA: Diagnosis not present

## 2017-11-14 ENCOUNTER — Encounter: Payer: Self-pay | Admitting: Family Medicine

## 2017-11-14 NOTE — Telephone Encounter (Signed)
If you look at the 4/4 visit it says preventative visit

## 2017-11-19 ENCOUNTER — Ambulatory Visit (INDEPENDENT_AMBULATORY_CARE_PROVIDER_SITE_OTHER): Payer: 59 | Admitting: Orthopedic Surgery

## 2017-11-19 ENCOUNTER — Encounter (INDEPENDENT_AMBULATORY_CARE_PROVIDER_SITE_OTHER): Payer: Self-pay | Admitting: Orthopedic Surgery

## 2017-11-19 VITALS — Ht 68.0 in | Wt 214.0 lb

## 2017-11-19 DIAGNOSIS — L97521 Non-pressure chronic ulcer of other part of left foot limited to breakdown of skin: Secondary | ICD-10-CM

## 2017-11-19 DIAGNOSIS — Z89422 Acquired absence of other left toe(s): Secondary | ICD-10-CM
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
## 2017-11-21 ENCOUNTER — Ambulatory Visit (INDEPENDENT_AMBULATORY_CARE_PROVIDER_SITE_OTHER): Payer: 59 | Admitting: Orthopedic Surgery

## 2017-11-26 ENCOUNTER — Encounter (INDEPENDENT_AMBULATORY_CARE_PROVIDER_SITE_OTHER): Payer: Self-pay | Admitting: Orthopedic Surgery

## 2017-11-26 ENCOUNTER — Ambulatory Visit (INDEPENDENT_AMBULATORY_CARE_PROVIDER_SITE_OTHER): Payer: 59 | Admitting: Orthopedic Surgery

## 2017-11-26 VITALS — Ht 68.0 in | Wt 214.0 lb

## 2017-11-26 DIAGNOSIS — Z89422 Acquired absence of other left toe(s): Secondary | ICD-10-CM

## 2017-11-26 DIAGNOSIS — B351 Tinea unguium: Secondary | ICD-10-CM

## 2017-11-26 DIAGNOSIS — L97521 Non-pressure chronic ulcer of other part of left foot limited to breakdown of skin: Secondary | ICD-10-CM

## 2017-11-26 DIAGNOSIS — L97511 Non-pressure chronic ulcer of other part of right foot limited to breakdown of skin: Secondary | ICD-10-CM
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
## 2017-12-02 MED FILL — SERTRALINE HCL 100 MG TAB: 100 | 90 days supply | Qty: 180 | Fill #2

## 2017-12-12 ENCOUNTER — Ambulatory Visit (INDEPENDENT_AMBULATORY_CARE_PROVIDER_SITE_OTHER): Payer: 59 | Admitting: Orthopedic Surgery

## 2017-12-12 ENCOUNTER — Encounter (INDEPENDENT_AMBULATORY_CARE_PROVIDER_SITE_OTHER): Payer: Self-pay | Admitting: Orthopedic Surgery

## 2017-12-12 VITALS — Ht 68.0 in | Wt 214.0 lb

## 2017-12-12 DIAGNOSIS — L97511 Non-pressure chronic ulcer of other part of right foot limited to breakdown of skin: Secondary | ICD-10-CM | POA: Diagnosis not present

## 2017-12-12 DIAGNOSIS — Z89422 Acquired absence of other left toe(s): Secondary | ICD-10-CM
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
## 2017-12-19 DIAGNOSIS — T63441D Toxic effect of venom of bees, accidental (unintentional), subsequent encounter: Secondary | ICD-10-CM | POA: Diagnosis not present

## 2017-12-19 DIAGNOSIS — T63451D Toxic effect of venom of hornets, accidental (unintentional), subsequent encounter: Secondary | ICD-10-CM | POA: Diagnosis not present

## 2017-12-19 DIAGNOSIS — T63461D Toxic effect of venom of wasps, accidental (unintentional), subsequent encounter: Secondary | ICD-10-CM | POA: Diagnosis not present

## 2017-12-30 ENCOUNTER — Encounter (INDEPENDENT_AMBULATORY_CARE_PROVIDER_SITE_OTHER): Payer: Self-pay | Admitting: Orthopedic Surgery

## 2017-12-30 ENCOUNTER — Ambulatory Visit (INDEPENDENT_AMBULATORY_CARE_PROVIDER_SITE_OTHER): Payer: 59 | Admitting: Orthopedic Surgery

## 2017-12-30 DIAGNOSIS — M86272 Subacute osteomyelitis, left ankle and foot: Secondary | ICD-10-CM

## 2017-12-30 MED ORDER — HYDROCODONE-ACETAMINOPHEN 5-325 MG PO TABS: 1.0000 | ORAL_TABLET | ORAL | 0 refills | Status: DC | PRN

## 2017-12-30 MED FILL — HYDROCODON-APAP 5-325: 5-325 | 5 days supply | Qty: 30 | Fill #0
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
## 2018-01-01 ENCOUNTER — Encounter (HOSPITAL_COMMUNITY): Payer: Self-pay | Admitting: *Deleted

## 2018-01-01 ENCOUNTER — Other Ambulatory Visit: Payer: Self-pay

## 2018-01-01 MED FILL — ALLOPURINOL 100 MG TABLET: 100 | 90 days supply | Qty: 180 | Fill #1

## 2018-01-01 NOTE — Progress Notes (Signed)
Pt denies SOB, chest pain, and being under the care of a cardiologist. Pt denies having a cardiac cath. Pt stated that a chest x ray was done at [facility]; records requested. Pt made aware to stop taking vitamins, fish oil and herbal medications. Do not take any NSAIDs ie: Ibuprofen, Advil, Naproxen (Aleve), Motrin, BC and Goody Powder. Pt verbalized understanding of all pre-op instructions.

## 2018-01-02 ENCOUNTER — Ambulatory Visit (INDEPENDENT_AMBULATORY_CARE_PROVIDER_SITE_OTHER): Payer: 59 | Admitting: Orthopedic Surgery

## 2018-01-02 ENCOUNTER — Other Ambulatory Visit (INDEPENDENT_AMBULATORY_CARE_PROVIDER_SITE_OTHER): Payer: Self-pay | Admitting: Orthopedic Surgery

## 2018-01-02 DIAGNOSIS — M86272 Subacute osteomyelitis, left ankle and foot: Secondary | ICD-10-CM

## 2018-01-02 DIAGNOSIS — M6702 Short Achilles tendon (acquired), left ankle: Secondary | ICD-10-CM | POA: Diagnosis not present

## 2018-01-02 DIAGNOSIS — H5703 Miosis: Secondary | ICD-10-CM | POA: Diagnosis not present

## 2018-01-02 DIAGNOSIS — H40013 Open angle with borderline findings, low risk, bilateral: Secondary | ICD-10-CM | POA: Diagnosis not present

## 2018-01-02 DIAGNOSIS — H01009 Unspecified blepharitis unspecified eye, unspecified eyelid: Secondary | ICD-10-CM | POA: Diagnosis not present

## 2018-01-02 NOTE — Anesthesia Preprocedure Evaluation (Addendum)
Anesthesia Evaluation  Patient identified by MRN, date of birth, ID band Patient awake    Reviewed: Allergy & Precautions, NPO status , Patient's Chart, lab work & pertinent test results  Airway Mallampati: I  TM Distance: >3 FB Neck ROM: Full    Dental  (+) Teeth Intact, Dental Advisory Given, Chipped,    Pulmonary Current Smoker,    Pulmonary exam normal        Cardiovascular Exercise Tolerance: Good hypertension, Pt. on medications Normal cardiovascular exam Rhythm:Regular Rate:Normal  ECHO 02/02/16 - Left ventricle: The cavity size was normal. Wall thickness was   increased in a pattern of mild LVH. Systolic function was normal.   The estimated ejection fraction was in the range of 55% to 60%.   Wall motion was normal; there were no regional wall motion   abnormalities.    Neuro/Psych Left foot drop    GI/Hepatic GERD  Medicated and Controlled,  Endo/Other    Renal/GU Renal diseaseS/P nephrectomy for renal cell CA Cr 1.4     Musculoskeletal  (+) Arthritis , Osteoarthritis,    Abdominal   Peds  Hematology   Anesthesia Other Findings   Reproductive/Obstetrics                            Lab Results  Component Value Date   WBC 6.1 10/31/2017   HGB 13.0 10/31/2017   HCT 38.6 (L) 10/31/2017   MCV 88.6 10/31/2017   PLT 250.0 10/31/2017    Anesthesia Physical Anesthesia Plan  ASA: III  Anesthesia Plan: General and Regional   Post-op Pain Management: GA combined w/ Regional for post-op pain   Induction: Intravenous  PONV Risk Score and Plan: Treatment may vary due to age or medical condition  Airway Management Planned: LMA  Additional Equipment:   Intra-op Plan:   Post-operative Plan: Extubation in OR  Informed Consent: I have reviewed the patients History and Physical, chart, labs and discussed the procedure including the risks, benefits and alternatives for the proposed  anesthesia with the patient or authorized representative who has indicated his/her understanding and acceptance.     Plan Discussed with: CRNA, Anesthesiologist and Surgeon  Anesthesia Plan Comments:        Anesthesia Quick Evaluation

## 2018-01-03 ENCOUNTER — Encounter (HOSPITAL_COMMUNITY)
Admission: AD | Disposition: A | Discharge status: Home / Self Care | Payer: Self-pay | Source: Ambulatory Visit | Attending: Orthopedic Surgery

## 2018-01-03 ENCOUNTER — Inpatient Hospital Stay (HOSPITAL_COMMUNITY)
Admission: AD | Admit: 2018-01-03 | Discharge: 2018-01-04 | DRG: 476 | Disposition: A | Discharge status: Home / Self Care | Payer: 59 | Source: Ambulatory Visit | Attending: Orthopedic Surgery | Admitting: Orthopedic Surgery

## 2018-01-03 ENCOUNTER — Ambulatory Visit (HOSPITAL_COMMUNITY): Payer: 59 | Admitting: Anesthesiology

## 2018-01-03 DIAGNOSIS — Z7982 Long term (current) use of aspirin: Secondary | ICD-10-CM

## 2018-01-03 DIAGNOSIS — Z9079 Acquired absence of other genital organ(s): Secondary | ICD-10-CM | POA: Diagnosis not present

## 2018-01-03 DIAGNOSIS — G629 Polyneuropathy, unspecified: Secondary | ICD-10-CM | POA: Diagnosis present

## 2018-01-03 DIAGNOSIS — Z905 Acquired absence of kidney: Secondary | ICD-10-CM | POA: Diagnosis not present

## 2018-01-03 DIAGNOSIS — Z8546 Personal history of malignant neoplasm of prostate: Secondary | ICD-10-CM | POA: Diagnosis not present

## 2018-01-03 DIAGNOSIS — M21372 Foot drop, left foot: Secondary | ICD-10-CM | POA: Diagnosis present

## 2018-01-03 DIAGNOSIS — Z8249 Family history of ischemic heart disease and other diseases of the circulatory system: Secondary | ICD-10-CM | POA: Diagnosis not present

## 2018-01-03 DIAGNOSIS — M86272 Subacute osteomyelitis, left ankle and foot: Principal | ICD-10-CM | POA: Diagnosis present

## 2018-01-03 DIAGNOSIS — Z91030 Bee allergy status: Secondary | ICD-10-CM

## 2018-01-03 DIAGNOSIS — M6702 Short Achilles tendon (acquired), left ankle: Secondary | ICD-10-CM | POA: Diagnosis not present

## 2018-01-03 DIAGNOSIS — F419 Anxiety disorder, unspecified: Secondary | ICD-10-CM | POA: Diagnosis present

## 2018-01-03 DIAGNOSIS — Z881 Allergy status to other antibiotic agents status: Secondary | ICD-10-CM

## 2018-01-03 DIAGNOSIS — Z85528 Personal history of other malignant neoplasm of kidney: Secondary | ICD-10-CM | POA: Diagnosis not present

## 2018-01-03 DIAGNOSIS — K219 Gastro-esophageal reflux disease without esophagitis: Secondary | ICD-10-CM | POA: Diagnosis present

## 2018-01-03 DIAGNOSIS — Z79899 Other long term (current) drug therapy: Secondary | ICD-10-CM | POA: Diagnosis not present

## 2018-01-03 DIAGNOSIS — M869 Osteomyelitis, unspecified: Secondary | ICD-10-CM | POA: Diagnosis not present

## 2018-01-03 DIAGNOSIS — Z89422 Acquired absence of other left toe(s): Secondary | ICD-10-CM | POA: Diagnosis not present

## 2018-01-03 DIAGNOSIS — F17210 Nicotine dependence, cigarettes, uncomplicated: Secondary | ICD-10-CM | POA: Diagnosis present

## 2018-01-03 DIAGNOSIS — Z89432 Acquired absence of left foot: Secondary | ICD-10-CM

## 2018-01-03 DIAGNOSIS — G8918 Other acute postprocedural pain: Secondary | ICD-10-CM | POA: Diagnosis not present

## 2018-01-03 DIAGNOSIS — Z981 Arthrodesis status: Secondary | ICD-10-CM

## 2018-01-03 DIAGNOSIS — Z801 Family history of malignant neoplasm of trachea, bronchus and lung: Secondary | ICD-10-CM

## 2018-01-03 DIAGNOSIS — L97529 Non-pressure chronic ulcer of other part of left foot with unspecified severity: Secondary | ICD-10-CM | POA: Diagnosis not present

## 2018-01-03 HISTORY — PX: AMPUTATION: SHX166

## 2018-01-03 HISTORY — DX: Osteomyelitis, unspecified: M86.9

## 2018-01-03 LAB — BASIC METABOLIC PANEL
Anion gap: 8 (ref 5–15)
BUN: 21 mg/dL — ABNORMAL HIGH (ref 6–20)
CO2: 22 mmol/L (ref 22–32)
Calcium: 9.3 mg/dL (ref 8.9–10.3)
Chloride: 108 mmol/L (ref 101–111)
Creatinine, Ser: 1.19 mg/dL (ref 0.61–1.24)
GFR calc Af Amer: >60 mL/min (ref >60)
GFR calc non Af Amer: >60 mL/min (ref >60)
Glucose, Bld: 114 mg/dL — ABNORMAL HIGH (ref 65–99)
Potassium: 4.6 mmol/L (ref 3.5–5.1)
Sodium: 138 mmol/L (ref 135–145)

## 2018-01-03 LAB — CBC
HCT: 40.7 % (ref 39.0–52.0)
Hemoglobin: 12.9 g/dL — ABNORMAL LOW (ref 13.0–17.0)
MCH: 28.8 pg (ref 26.0–34.0)
MCHC: 31.7 g/dL (ref 30.0–36.0)
MCV: 90.8 fL (ref 78.0–100.0)
Platelets: 217 10*3/uL (ref 150–400)
RBC: 4.48 MIL/uL (ref 4.22–5.81)
RDW: 15.9 % — ABNORMAL HIGH (ref 11.5–15.5)
WBC: 5.7 10*3/uL (ref 4.0–10.5)

## 2018-01-03 SURGERY — AMPUTATION, FOOT, PARTIAL
Anesthesia: Regional | Laterality: Left

## 2018-01-03 MED ORDER — ONDANSETRON HCL 4 MG/2ML IJ SOLN: 4.0000 mg | Freq: Four times a day (QID) | INTRAMUSCULAR | Status: DC | PRN

## 2018-01-03 MED ORDER — ONDANSETRON HCL 4 MG PO TABS: 4.0000 mg | ORAL_TABLET | Freq: Four times a day (QID) | ORAL | Status: DC | PRN

## 2018-01-03 MED ORDER — GABAPENTIN 300 MG PO CAPS
ORAL_CAPSULE | ORAL | Status: AC
  Filled 2018-01-03: qty 1

## 2018-01-03 MED ORDER — ONDANSETRON HCL 4 MG/2ML IJ SOLN
INTRAMUSCULAR | Status: DC | PRN
  Administered 2018-01-03: 4 mg via INTRAVENOUS

## 2018-01-03 MED ORDER — NICOTINE 14 MG/24HR TD PT24
14.0000 mg | MEDICATED_PATCH | Freq: Every day | TRANSDERMAL | Status: DC
  Administered 2018-01-03 – 2018-01-04 (×2): 14 mg via TRANSDERMAL
  Filled 2018-01-03 (×2): qty 1

## 2018-01-03 MED ORDER — METHOCARBAMOL 1000 MG/10ML IJ SOLN
500.0000 mg | Freq: Four times a day (QID) | INTRAMUSCULAR | Status: DC | PRN
  Filled 2018-01-03: qty 5

## 2018-01-03 MED ORDER — MIDAZOLAM HCL 2 MG/2ML IJ SOLN
INTRAMUSCULAR | Status: AC
  Administered 2018-01-03: 1 mg via INTRAVENOUS
  Filled 2018-01-03: qty 2

## 2018-01-03 MED ORDER — HYDROCODONE-ACETAMINOPHEN 7.5-325 MG PO TABS
ORAL_TABLET | ORAL | Status: AC
  Filled 2018-01-03: qty 1

## 2018-01-03 MED ORDER — LACTATED RINGERS IV SOLN
INTRAVENOUS | Status: DC | PRN
  Administered 2018-01-03: 07:00:00 via INTRAVENOUS

## 2018-01-03 MED ORDER — POLYETHYLENE GLYCOL 3350 17 G PO PACK: 17.0000 g | PACK | Freq: Every day | ORAL | Status: DC | PRN

## 2018-01-03 MED ORDER — MEPERIDINE HCL 50 MG/ML IJ SOLN: 6.2500 mg | INTRAMUSCULAR | Status: DC | PRN

## 2018-01-03 MED ORDER — SERTRALINE HCL 100 MG PO TABS
200.0000 mg | ORAL_TABLET | Freq: Every day | ORAL | Status: DC
  Administered 2018-01-04: 200 mg via ORAL
  Filled 2018-01-03: qty 2

## 2018-01-03 MED ORDER — ASPIRIN EC 325 MG PO TBEC
325.0000 mg | DELAYED_RELEASE_TABLET | Freq: Every day | ORAL | Status: DC
  Administered 2018-01-04: 325 mg via ORAL
  Filled 2018-01-03: qty 1

## 2018-01-03 MED ORDER — FENTANYL CITRATE (PF) 100 MCG/2ML IJ SOLN
50.0000 ug | Freq: Once | INTRAMUSCULAR | Status: AC
  Administered 2018-01-03: 50 ug via INTRAVENOUS

## 2018-01-03 MED ORDER — HYDROMORPHONE HCL 2 MG/ML IJ SOLN
0.5000 mg | INTRAMUSCULAR | Status: DC | PRN
  Administered 2018-01-03: 1 mg via INTRAVENOUS
  Filled 2018-01-03: qty 1

## 2018-01-03 MED ORDER — HYDROCODONE-ACETAMINOPHEN 7.5-325 MG PO TABS
1.0000 | ORAL_TABLET | Freq: Once | ORAL | Status: AC | PRN
  Administered 2018-01-03: 1 via ORAL

## 2018-01-03 MED ORDER — DOCUSATE SODIUM 100 MG PO CAPS
100.0000 mg | ORAL_CAPSULE | Freq: Two times a day (BID) | ORAL | Status: DC
  Filled 2018-01-03 (×2): qty 1

## 2018-01-03 MED ORDER — LISINOPRIL 20 MG PO TABS
20.0000 mg | ORAL_TABLET | Freq: Every day | ORAL | Status: DC
  Administered 2018-01-04: 20 mg via ORAL
  Filled 2018-01-03: qty 1

## 2018-01-03 MED ORDER — SODIUM CHLORIDE 0.9 % IV SOLN
INTRAVENOUS | Status: DC
  Administered 2018-01-03: 12:00:00 via INTRAVENOUS

## 2018-01-03 MED ORDER — CEFAZOLIN SODIUM-DEXTROSE 1-4 GM/50ML-% IV SOLN
1.0000 g | Freq: Four times a day (QID) | INTRAVENOUS | Status: AC
  Administered 2018-01-03 – 2018-01-04 (×3): 1 g via INTRAVENOUS
  Filled 2018-01-03 (×3): qty 50

## 2018-01-03 MED ORDER — ACETAMINOPHEN 325 MG PO TABS: 325.0000 mg | ORAL_TABLET | Freq: Four times a day (QID) | ORAL | Status: DC | PRN

## 2018-01-03 MED ORDER — HYDROCODONE-ACETAMINOPHEN 5-325 MG PO TABS
ORAL_TABLET | ORAL | Status: DC
Start: 2018-01-03 — End: 2018-01-03
  Filled 2018-01-03: qty 1

## 2018-01-03 MED ORDER — PROMETHAZINE HCL 25 MG/ML IJ SOLN: 6.2500 mg | INTRAMUSCULAR | Status: DC | PRN

## 2018-01-03 MED ORDER — ONDANSETRON HCL 4 MG/2ML IJ SOLN
INTRAMUSCULAR | Status: AC
  Filled 2018-01-03: qty 2

## 2018-01-03 MED ORDER — CLONIDINE HCL 0.2 MG PO TABS
ORAL_TABLET | ORAL | Status: AC
  Filled 2018-01-03: qty 1

## 2018-01-03 MED ORDER — ASPIRIN EC 81 MG PO TBEC: 81.0000 mg | DELAYED_RELEASE_TABLET | Freq: Every day | ORAL | Status: DC

## 2018-01-03 MED ORDER — CEFAZOLIN SODIUM-DEXTROSE 2-4 GM/100ML-% IV SOLN
2.0000 g | INTRAVENOUS | Status: AC
  Administered 2018-01-03: 2 g via INTRAVENOUS
  Filled 2018-01-03: qty 100

## 2018-01-03 MED ORDER — ACETAMINOPHEN 10 MG/ML IV SOLN: 1000.0000 mg | Freq: Once | INTRAVENOUS | Status: DC | PRN

## 2018-01-03 MED ORDER — PHENYLEPHRINE HCL 10 MG/ML IJ SOLN
INTRAMUSCULAR | Status: DC | PRN
  Administered 2018-01-03: 120 ug via INTRAVENOUS
  Administered 2018-01-03: 160 ug via INTRAVENOUS
  Administered 2018-01-03: 120 ug via INTRAVENOUS

## 2018-01-03 MED ORDER — LIDOCAINE HCL (CARDIAC) PF 100 MG/5ML IV SOSY
PREFILLED_SYRINGE | INTRAVENOUS | Status: DC | PRN
  Administered 2018-01-03: 100 mg via INTRAVENOUS

## 2018-01-03 MED ORDER — MIDAZOLAM HCL 2 MG/2ML IJ SOLN
1.0000 mg | Freq: Once | INTRAMUSCULAR | Status: AC
  Administered 2018-01-03: 1 mg via INTRAVENOUS

## 2018-01-03 MED ORDER — OXYCODONE HCL 5 MG PO TABS
10.0000 mg | ORAL_TABLET | ORAL | Status: DC | PRN
  Administered 2018-01-04 (×2): 15 mg via ORAL
  Filled 2018-01-03 (×2): qty 3
  Filled 2018-01-03: qty 2

## 2018-01-03 MED ORDER — LACTATED RINGERS IV SOLN
INTRAVENOUS | Status: DC
  Administered 2018-01-03: 50 mL/h via INTRAVENOUS

## 2018-01-03 MED ORDER — PROPOFOL 10 MG/ML IV BOLUS
INTRAVENOUS | Status: DC | PRN
  Administered 2018-01-03: 150 mg via INTRAVENOUS

## 2018-01-03 MED ORDER — 0.9 % SODIUM CHLORIDE (POUR BTL) OPTIME
TOPICAL | Status: DC | PRN
  Administered 2018-01-03: 1000 mL

## 2018-01-03 MED ORDER — FENTANYL CITRATE (PF) 250 MCG/5ML IJ SOLN
INTRAMUSCULAR | Status: AC
  Filled 2018-01-03: qty 5

## 2018-01-03 MED ORDER — BISACODYL 10 MG RE SUPP: 10.0000 mg | Freq: Every day | RECTAL | Status: DC | PRN

## 2018-01-03 MED ORDER — CLONIDINE HCL 0.2 MG PO TABS
0.2000 mg | ORAL_TABLET | Freq: Once | ORAL | Status: AC
  Administered 2018-01-03: 0.2 mg via ORAL

## 2018-01-03 MED ORDER — MAGNESIUM CITRATE PO SOLN: 1.0000 | Freq: Once | ORAL | Status: DC | PRN

## 2018-01-03 MED ORDER — FENTANYL CITRATE (PF) 100 MCG/2ML IJ SOLN
INTRAMUSCULAR | Status: DC | PRN
  Administered 2018-01-03: 50 ug via INTRAVENOUS

## 2018-01-03 MED ORDER — ACETAMINOPHEN 325 MG PO TABS: 650.0000 mg | ORAL_TABLET | Freq: Once | ORAL | Status: DC

## 2018-01-03 MED ORDER — DARIFENACIN HYDROBROMIDE ER 7.5 MG PO TB24
7.5000 mg | ORAL_TABLET | Freq: Every day | ORAL | Status: DC
  Administered 2018-01-03 – 2018-01-04 (×2): 7.5 mg via ORAL
  Filled 2018-01-03 (×2): qty 1

## 2018-01-03 MED ORDER — ALLOPURINOL 100 MG PO TABS
200.0000 mg | ORAL_TABLET | Freq: Every morning | ORAL | Status: DC
  Administered 2018-01-04: 200 mg via ORAL
  Filled 2018-01-03: qty 2

## 2018-01-03 MED ORDER — OXYCODONE HCL 5 MG PO TABS
5.0000 mg | ORAL_TABLET | ORAL | Status: DC | PRN
  Administered 2018-01-03 (×3): 10 mg via ORAL
  Filled 2018-01-03 (×2): qty 2

## 2018-01-03 MED ORDER — GABAPENTIN 300 MG PO CAPS
300.0000 mg | ORAL_CAPSULE | Freq: Once | ORAL | Status: AC
  Administered 2018-01-03: 300 mg via ORAL

## 2018-01-03 MED ORDER — EPHEDRINE SULFATE 50 MG/ML IJ SOLN
INTRAMUSCULAR | Status: DC | PRN
  Administered 2018-01-03 (×2): 10 mg via INTRAVENOUS

## 2018-01-03 MED ORDER — ACETAMINOPHEN 325 MG PO TABS
ORAL_TABLET | ORAL | Status: AC
  Administered 2018-01-03: 650 mg
  Filled 2018-01-03: qty 2

## 2018-01-03 MED ORDER — FENTANYL CITRATE (PF) 100 MCG/2ML IJ SOLN
INTRAMUSCULAR | Status: AC
  Administered 2018-01-03: 50 ug via INTRAVENOUS
  Filled 2018-01-03: qty 2

## 2018-01-03 MED ORDER — METOCLOPRAMIDE HCL 5 MG PO TABS: 5.0000 mg | ORAL_TABLET | Freq: Three times a day (TID) | ORAL | Status: DC | PRN

## 2018-01-03 MED ORDER — METOCLOPRAMIDE HCL 5 MG/ML IJ SOLN: 5.0000 mg | Freq: Three times a day (TID) | INTRAMUSCULAR | Status: DC | PRN

## 2018-01-03 MED ORDER — METHOCARBAMOL 500 MG PO TABS
500.0000 mg | ORAL_TABLET | Freq: Four times a day (QID) | ORAL | Status: DC | PRN
  Administered 2018-01-03 – 2018-01-04 (×4): 500 mg via ORAL
  Filled 2018-01-03 (×4): qty 1

## 2018-01-03 MED ORDER — HYDROMORPHONE HCL 2 MG/ML IJ SOLN: 0.3000 mg | INTRAMUSCULAR | Status: DC | PRN

## 2018-01-03 MED ORDER — AMLODIPINE BESYLATE 10 MG PO TABS
10.0000 mg | ORAL_TABLET | Freq: Every morning | ORAL | Status: DC
  Administered 2018-01-04: 10 mg via ORAL
  Filled 2018-01-03: qty 1

## 2018-01-03 MED ORDER — HYDROMORPHONE HCL 2 MG/ML IJ SOLN: 0.5000 mg | INTRAMUSCULAR | Status: DC | PRN

## 2018-01-03 MED ORDER — CHLORHEXIDINE GLUCONATE 4 % EX LIQD: 60.0000 mL | Freq: Once | CUTANEOUS | Status: DC

## 2018-01-03 SURGICAL SUPPLY — 37 items
BENZOIN TINCTURE PRP APPL 2/3 (GAUZE/BANDAGES/DRESSINGS) ×3 IMPLANT
BLADE SAW SGTL HD 18.5X60.5X1. (BLADE) ×3 IMPLANT
BLADE SURG 21 STRL SS (BLADE) ×3 IMPLANT
BNDG COHESIVE 4X5 TAN STRL (GAUZE/BANDAGES/DRESSINGS) IMPLANT
BNDG GAUZE ELAST 4 BULKY (GAUZE/BANDAGES/DRESSINGS) IMPLANT
COVER SURGICAL LIGHT HANDLE (MISCELLANEOUS) ×3 IMPLANT
DRAPE INCISE IOBAN 66X45 STRL (DRAPES) ×3 IMPLANT
DRAPE U-SHAPE 47X51 STRL (DRAPES) ×3 IMPLANT
DRESSING PREVENA PLUS CUSTOM (GAUZE/BANDAGES/DRESSINGS) ×1 IMPLANT
DRSG ADAPTIC 3X8 NADH LF (GAUZE/BANDAGES/DRESSINGS) ×3 IMPLANT
DRSG PAD ABDOMINAL 8X10 ST (GAUZE/BANDAGES/DRESSINGS) IMPLANT
DRSG PREVENA PLUS CUSTOM (GAUZE/BANDAGES/DRESSINGS) ×3
DURAPREP 26ML APPLICATOR (WOUND CARE) ×3 IMPLANT
ELECT REM PT RETURN 9FT ADLT (ELECTROSURGICAL) ×3
ELECTRODE REM PT RTRN 9FT ADLT (ELECTROSURGICAL) ×1 IMPLANT
GAUZE SPONGE 4X4 12PLY STRL (GAUZE/BANDAGES/DRESSINGS) ×3 IMPLANT
GLOVE BIOGEL PI IND STRL 9 (GLOVE) ×1 IMPLANT
GLOVE BIOGEL PI INDICATOR 9 (GLOVE) ×2
GLOVE SURG ORTHO 9.0 STRL STRW (GLOVE) ×3 IMPLANT
GOWN STRL REUS W/ TWL XL LVL3 (GOWN DISPOSABLE) ×2 IMPLANT
GOWN STRL REUS W/TWL XL LVL3 (GOWN DISPOSABLE) ×4
KIT BASIN OR (CUSTOM PROCEDURE TRAY) ×3 IMPLANT
KIT DRSG PREVENA PLUS 7DAY 125 (MISCELLANEOUS) ×3 IMPLANT
KIT PREVENA INCISION MGT 13 (CANNISTER) ×3 IMPLANT
KIT TURNOVER KIT B (KITS) ×3 IMPLANT
NS IRRIG 1000ML POUR BTL (IV SOLUTION) ×3 IMPLANT
PACK ORTHO EXTREMITY (CUSTOM PROCEDURE TRAY) ×3 IMPLANT
PAD ARMBOARD 7.5X6 YLW CONV (MISCELLANEOUS) IMPLANT
SPONGE LAP 18X18 X RAY DECT (DISPOSABLE) IMPLANT
SUT ETHILON 2 0 PSLX (SUTURE) ×6 IMPLANT
SUT VIC AB 2-0 CTB1 (SUTURE) IMPLANT
TOWEL OR 17X24 6PK STRL BLUE (TOWEL DISPOSABLE) IMPLANT
TOWEL OR 17X26 10 PK STRL BLUE (TOWEL DISPOSABLE) ×3 IMPLANT
TUBE CONNECTING 12'X1/4 (SUCTIONS) ×1
TUBE CONNECTING 12X1/4 (SUCTIONS) ×2 IMPLANT
WATER STERILE IRR 1000ML POUR (IV SOLUTION) IMPLANT
YANKAUER SUCT BULB TIP NO VENT (SUCTIONS) ×3 IMPLANT

## 2018-01-03 NOTE — Evaluation (Signed)
Physical Therapy Evaluation Patient Details Name: Taylor Elliott MRN: [id number] DOB: Jun 12, 1951 Today's Date: 01/03/2018   History of Present Illness  Pt is a 67 y/o male s/p L transmet amputation. PMH including but not limited to cancer, CKD, HTN.  Clinical Impression  Pt presented supine in bed with HOB elevated, awake and willing to participate in therapy session. Pt's spouse present throughout session as well. Prior to admission, pt reported that he was ambulating with bilateral axillary crutches and independent with ADLs. Pt lives in a single level house with two steps to enter. Pt currently able to perform bed mobility with modified independence, transfers with min guard and ambulated within room with RW and min guard for safety. PT will continue to follow acutely to progress mobility as tolerated and to ensure a safe d/c home. Plan for stair training at next session if appropriate.    Follow Up Recommendations No PT follow up;Supervision for mobility/OOB    Equipment Recommendations  Rolling walker with 5" wheels    Recommendations for Other Services       Precautions / Restrictions Precautions Precautions: Fall Precaution Comments: wound VAC Restrictions Weight Bearing Restrictions: Yes LLE Weight Bearing: Non weight bearing      Mobility  Bed Mobility Overal bed mobility: Modified Independent                Transfers Overall transfer level: Needs assistance Equipment used: Rolling walker (2 wheeled) Transfers: Sit to/from Stand Sit to Stand: Min guard         General transfer comment: cueing for safety, min guard; pt performed x1 from EOB and x1 from toilet  Ambulation/Gait Ambulation/Gait assistance: Min guard Ambulation Distance (Feet): 20 Feet(20' x2) Assistive device: Rolling walker (2 wheeled) Gait Pattern/deviations: (hop-to on R LE) Gait velocity: decreased Gait velocity interpretation: <1.31 ft/sec, indicative of household ambulator General  Gait Details: mild instability but no overt LOB or need for physical assistance, min guard and cueing for safety  Stairs            Wheelchair Mobility    Modified Rankin (Stroke Patients Only)       Balance Overall balance assessment: Needs assistance Sitting-balance support: No upper extremity supported Sitting balance-Leahy Scale: Good     Standing balance support: During functional activity;Bilateral upper extremity supported Standing balance-Leahy Scale: Poor                               Pertinent Vitals/Pain Pain Assessment: Faces Faces Pain Scale: Hurts little more Pain Location: L LE Pain Descriptors / Indicators: Guarding Pain Intervention(s): Monitored during session;Repositioned    Home Living Family/patient expects to be discharged to:: Private residence Living Arrangements: Spouse/significant other Available Help at Discharge: Family;Available PRN/intermittently Type of Home: House Home Access: Stairs to enter Entrance Stairs-Rails: Right;Left;Can reach both Entrance Stairs-Number of Steps: 2 Home Layout: One level Home Equipment: Crutches(knee scooter)      Prior Function Level of Independence: Independent with assistive device(s)         Comments: pt was ambulating with bilateral axillary crutches     Hand Dominance        Extremity/Trunk Assessment   Upper Extremity Assessment Upper Extremity Assessment: Overall WFL for tasks assessed    Lower Extremity Assessment Lower Extremity Assessment: Overall WFL for tasks assessed;LLE deficits/detail LLE Deficits / Details: wound VAC on foot; pt able to maintain NWB independently throughout  Communication   Communication: No difficulties  Cognition Arousal/Alertness: Awake/alert Behavior During Therapy: WFL for tasks assessed/performed Overall Cognitive Status: Within Functional Limits for tasks assessed                                         General Comments      Exercises     Assessment/Plan    PT Assessment Patient needs continued PT services  PT Problem List Decreased balance;Decreased mobility;Decreased coordination;Decreased safety awareness;Decreased knowledge of use of DME;Decreased knowledge of precautions;Pain       PT Treatment Interventions DME instruction;Gait training;Stair training;Functional mobility training;Therapeutic activities;Therapeutic exercise;Balance training;Neuromuscular re-education;Patient/family education    PT Goals (Current goals can be found in the Care Plan section)  Acute Rehab PT Goals Patient Stated Goal: return home PT Goal Formulation: With patient/family Time For Goal Achievement: 01/17/18 Potential to Achieve Goals: Good    Frequency Min 5X/week   Barriers to discharge        Co-evaluation               AM-PAC PT "6 Clicks" Daily Activity  Outcome Measure Difficulty turning over in bed (including adjusting bedclothes, sheets and blankets)?: None Difficulty moving from lying on back to sitting on the side of the bed? : None Difficulty sitting down on and standing up from a chair with arms (e.g., wheelchair, bedside commode, etc,.)?: Unable Help needed moving to and from a bed to chair (including a wheelchair)?: A Little Help needed walking in hospital room?: A Little Help needed climbing 3-5 steps with a railing? : A Little 6 Click Score: 18    End of Session Equipment Utilized During Treatment: Gait belt Activity Tolerance: Patient tolerated treatment well Patient left: in bed;with call bell/phone within reach;with family/visitor present Nurse Communication: Mobility status;Patient requests pain meds PT Visit Diagnosis: Other abnormalities of gait and mobility (R26.89)    Time: 5643-3295 PT Time Calculation (min) (ACUTE ONLY): 44 min   Charges:   PT Evaluation $PT Eval Moderate Complexity: 1 Mod PT Treatments $Gait Training: 8-22 mins $Therapeutic  Activity: 8-22 mins   PT G Codes:        [place], PT, DPT [place] 01/03/2018, 5:25 PM

## 2018-01-03 NOTE — Anesthesia Procedure Notes (Signed)
Procedure Name: LMA Insertion Date/Time: 01/03/2018 8:47 AM Performed by: Carney Living, CRNA Pre-anesthesia Checklist: Patient identified, Emergency Drugs available, Suction available, Patient being monitored and Timeout performed Patient Re-evaluated:Patient Re-evaluated prior to induction Oxygen Delivery Method: Circle system utilized Preoxygenation: Pre-oxygenation with 100% oxygen Induction Type: IV induction LMA: LMA inserted LMA Size: 4.0 Number of attempts: 1 Placement Confirmation: positive ETCO2,  breath sounds checked- equal and bilateral and CO2 detector Tube secured with: Tape Dental Injury: Teeth and Oropharynx as per pre-operative assessment

## 2018-01-03 NOTE — Anesthesia Postprocedure Evaluation (Signed)
Anesthesia Post Note  Patient: Taylor Elliott  Procedure(s) Performed: LEFT TRANSMETATARSAL AMPUTATION AND ACHILLES LENGTHENING (Left )     Patient location during evaluation: PACU Anesthesia Type: Regional and General Level of consciousness: awake and alert Pain management: pain level controlled Vital Signs Assessment: post-procedure vital signs reviewed and stable Respiratory status: spontaneous breathing, nonlabored ventilation, respiratory function stable and patient connected to nasal cannula oxygen Cardiovascular status: blood pressure returned to baseline and stable Postop Assessment: no apparent nausea or vomiting Anesthetic complications: no    Last Vitals:  Vitals:   01/03/18 0955 01/03/18 1010  BP: (!) 96/57 (!) 98/56  Pulse: 69 66  Resp: 14 20  Temp:    SpO2: 96% 95%    Last Pain:  Vitals:   01/03/18 0955  TempSrc:   PainSc: 0-No pain                 Barnet Glasgow

## 2018-01-03 NOTE — Transfer of Care (Signed)
Immediate Anesthesia Transfer of Care Note  Patient: Taylor Elliott  Procedure(s) Performed: LEFT TRANSMETATARSAL AMPUTATION AND ACHILLES LENGTHENING (Left )  Patient Location: PACU  Anesthesia Type:General and GA combined with regional for post-op pain  Level of Consciousness: awake, alert , oriented and patient cooperative  Airway & Oxygen Therapy: Patient Spontanous Breathing and Patient connected to nasal cannula oxygen  Post-op Assessment: Report given to RN, Post -op Vital signs reviewed and stable and Patient moving all extremities X 4  Post vital signs: Reviewed and stable  Last Vitals:  Vitals Value Taken Time  BP 102/61 01/03/2018  9:22 AM  Temp    Pulse 86 01/03/2018  9:23 AM  Resp 15 01/03/2018  9:23 AM  SpO2 98 % 01/03/2018  9:23 AM  Vitals shown include unvalidated device data.  Last Pain:  Vitals:   01/03/18 0731  TempSrc:   PainSc: 5       Patients Stated Pain Goal: 3 (03/54/65 6812)  Complications: No apparent anesthesia complications

## 2018-01-03 NOTE — Anesthesia Procedure Notes (Signed)
Anesthesia Regional Block: Popliteal block   Pre-Anesthetic Checklist: ,, timeout performed, Correct Patient, Correct Site, Correct Laterality, Correct Procedure, Correct Position, site marked, Risks and benefits discussed, pre-op evaluation,  At surgeon's request and post-op pain management  Laterality: Left  Prep: Maximum Sterile Barrier Precautions used, chloraprep       Needles:  Injection technique: Single-shot  Needle Type: Echogenic Needle     Needle Length: 9cm  Needle Gauge: 21     Additional Needles:   Procedures:, nerve stimulator,,, ultrasound used (permanent image in chart),,,,   Nerve Stimulator or Paresthesia:  Response: Peroneal,  Response: Tibial,   Additional Responses:   Narrative:  Start time: 01/03/2018 8:05 AM End time: 01/03/2018 8:12 AM Injection made incrementally with aspirations every 5 mL. Anesthesiologist: Barnet Glasgow, MD

## 2018-01-03 NOTE — Op Note (Signed)
     Date of Surgery: 01/03/2018  INDICATIONS: Taylor Elliott is a 67 y.o.-year-old male who presents with osteomyelitis of the first metatarsal head left foot with Achilles contracture.  He is failed prolonged conservative wound care.Marland Kitchen  PREOPERATIVE DIAGNOSIS: Osteomyelitis ulceration left forefoot with heel cord contracture  POSTOPERATIVE DIAGNOSIS: Same.  PROCEDURE: Transmetatarsal amputation Application of Prevena wound VAC Achilles tendon lengthening  SURGEON: Sharol Given, M.D.  ANESTHESIA:  general  IV FLUIDS AND URINE: See anesthesia.  ESTIMATED BLOOD LOSS: Minimal mL.  COMPLICATIONS: None.  DESCRIPTION OF PROCEDURE: The patient was brought to the operating room and underwent a general anesthetic. After adequate levels of anesthesia were obtained patient's lower extremity was prepped using DuraPrep draped into a sterile field. A timeout was called.  Patient underwent Z-lengthening of the Achilles tendon 3 percutaneous incisions were made to posterior medially and one posterior laterally and the Achilles tendon was lengthened with dorsiflexion 20 degrees short of neutral to dorsiflexion 20 degrees past neutral.  A fishmouth incision was made just proximal to the ulcerative nonviable tissue. This was carried sharply down to bone. A oscillating saw was used to perform a transmetatarsal amputation with a gentle cascade of the metatarsals and beveled plantarly. Electrocautery was used for hemostasis. The wound was irrigated with normal saline. The incision was closed using 2-0 nylon. A Prevena wound VAC was applied. This had a good suction fit. Patient was taken to the PACU in stable condition.    DISCHARGE PLANNING:  Antibiotic duration: 24 hours  Weightbearing: Nonweightbearing on the left   Pain medication: ordered  Dressing care/ Wound VAC: Continue wound VAC for 1 week  Discharge to: Discharge to home in 24 hours.  Follow-up: In the office 1 week post operative.  Taylor Score,  MD [place] 9:15 AM
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
## 2018-01-03 NOTE — Progress Notes (Signed)
Orthopedic Tech Progress Note Patient Details:  Taylor Elliott May 14, 1951 [id number]  Ortho Devices Type of Ortho Device: CAM walker Ortho Device/Splint Interventions: Application   Post Interventions Patient Tolerated: Well Instructions Provided: Care of device   [name] 01/03/2018, 6:34 PM

## 2018-01-04 ENCOUNTER — Encounter (HOSPITAL_COMMUNITY): Payer: Self-pay | Admitting: Orthopedic Surgery

## 2018-01-04 MED ORDER — HYDROCODONE-ACETAMINOPHEN 5-325 MG PO TABS: 1.0000 | ORAL_TABLET | ORAL | 0 refills | Status: DC | PRN

## 2018-01-04 NOTE — Plan of Care (Signed)
?  Problem: Elimination: ?Goal: Will not experience complications related to bowel motility ?Outcome: Progressing ?  ?Problem: Pain Managment: ?Goal: General experience of comfort will improve ?Outcome: Progressing ?  ?Problem: Safety: ?Goal: Ability to remain free from injury will improve ?Outcome: Progressing ?  ?

## 2018-01-04 NOTE — Discharge Summary (Signed)
Discharge Diagnoses:  Active Problems:   Acquired contracture of Achilles tendon, left   History of transmetatarsal amputation of left foot (Taylor Elliott)   Surgeries: Procedure(s): LEFT TRANSMETATARSAL AMPUTATION AND ACHILLES LENGTHENING on 01/03/2018    Consultants:   Discharged Condition: Improved  Hospital Course: Taylor Elliott is an 67 y.o. male who was admitted 01/03/2018 with a chief complaint of osteomyelitis left foot, with a final diagnosis of Osteomyelitis Left 1st Metatarsal Head and Achilles Contracture.  Patient was brought to the operating room on 01/03/2018 and underwent Procedure(s): LEFT TRANSMETATARSAL AMPUTATION AND ACHILLES LENGTHENING.    Patient was given perioperative antibiotics:  Anti-infectives (From admission, onward)   Start     Dose/Rate Route Frequency Ordered Stop   01/03/18 1430  ceFAZolin (ANCEF) IVPB 1 g/50 mL premix     1 g 100 mL/hr over 30 Minutes Intravenous Every 6 hours 01/03/18 1158 01/04/18 0623   01/03/18 0715  ceFAZolin (ANCEF) IVPB 2g/100 mL premix     2 g 200 mL/hr over 30 Minutes Intravenous On call to O.R. 01/03/18 7902 01/03/18 4097    .  Patient was given sequential compression devices, early ambulation, and aspirin for DVT prophylaxis.  Recent vital signs:  Patient Vitals for the past 24 hrs:  BP Temp Temp src Pulse Resp SpO2  01/04/18 0530 115/63 98 F (36.7 C) Oral 79 - 97 %  01/04/18 0012 116/63 97.9 F (36.6 C) Oral 80 - 98 %  01/03/18 2027 109/69 97.9 F (36.6 C) Oral 74 - 97 %  01/03/18 1125 106/63 - - 65 18 96 %  01/03/18 1105 115/65 - - 62 15 98 %  01/03/18 1100 - - - 72 (!) 22 95 %  01/03/18 1040 (!) 95/55 - - 61 (!) 8 95 %  01/03/18 1010 (!) 98/56 - - 66 20 95 %  01/03/18 0955 (!) 96/57 - - 69 14 96 %  01/03/18 0940 (!) 102/55 - - 76 17 98 %  01/03/18 0925 102/61 97.8 F (36.6 C) - 89 15 96 %  .  Recent laboratory studies: No results found.  Discharge Medications:   Allergies as of 01/04/2018      Reactions   Bee  Venom Anaphylaxis   Clindamycin/lincomycin Hives      Medication List    TAKE these medications   acetaminophen 500 MG tablet Commonly known as:  TYLENOL Take 1,000 mg by mouth daily as needed for mild pain or headache.   allopurinol 100 MG tablet Commonly known as:  ZYLOPRIM Take 2 tablets (200 mg total) by mouth every morning.   amLODipine 10 MG tablet Commonly known as:  NORVASC TAKE 1 TABLET BY MOUTH EVERY MORNING   aspirin EC 81 MG tablet Take 81 mg by mouth daily.   calcium carbonate 500 MG chewable tablet Commonly known as:  TUMS - dosed in mg elemental calcium Chew 2 tablets by mouth daily as needed for indigestion or heartburn.   EPINEPHrine 0.3 mg/0.3 mL Soaj injection Commonly known as:  EPI-PEN Inject 0.3 mg into the muscle once.   HYDROcodone-acetaminophen 5-325 MG tablet Commonly known as:  NORCO/VICODIN Take 1-2 tablets by mouth every 4 (four) hours as needed for moderate pain. What changed:  how much to take   lisinopril 20 MG tablet Commonly known as:  PRINIVIL,ZESTRIL Take 1 tablet (20 mg total) by mouth daily.   loperamide 2 MG capsule Commonly known as:  IMODIUM Take 2 mg by mouth as needed for diarrhea or loose  stools.   OVER THE COUNTER MEDICATION Take 1-2 tablets by mouth See admin instructions. Burn X Supplement - Take 2 tablets by mouth in the morning and take 1 tablet by mouth in the afternoon   ranitidine 150 MG capsule Commonly known as:  ZANTAC Take 150 mg by mouth 2 (two) times daily as needed for heartburn.   rosuvastatin 10 MG tablet Commonly known as:  CRESTOR Take 1 tablet (10 mg total) by mouth daily.   sertraline 100 MG tablet Commonly known as:  ZOLOFT 2 po qd What changed:    how much to take  how to take this  when to take this  additional instructions   solifenacin 10 MG tablet Commonly known as:  VESICARE Take 10 mg by mouth daily.            Discharge Care Instructions  (From admission, onward)         Start     Ordered   01/04/18 0000  Non weight bearing    Question Answer Comment  Laterality left   Extremity Lower      01/04/18 0822      Diagnostic Studies: No results found.  Patient benefited maximally from their hospital stay and there were no complications.     Disposition:  Discharge Instructions    Elevate operative extremity   Complete by:  As directed    Negative Pressure Wound Therapy - Incisional   Complete by:  As directed    Non weight bearing   Complete by:  As directed    Laterality:  left   Extremity:  Lower     Follow-up Information    Newt Minion, MD Follow up in 1 week(s).   Specialty:  Orthopedic Surgery Contact information: [place] [address] [phone]            Signed: Newt Minion 01/04/2018, 8:23 AM

## 2018-01-04 NOTE — Progress Notes (Signed)
Pt stated they could not wait for the RW and would just get it through [place]. Case manager notified, Pt discharged home accompanied by wife.

## 2018-01-04 NOTE — Progress Notes (Signed)
Physical Therapy Treatment Patient Details Name: Taylor Elliott MRN: [id number] DOB: 1951-01-30 Today's Date: 01/04/2018    History of Present Illness Pt is a 67 y/o male s/p L transmet amputation. PMH including but not limited to cancer, CKD, HTN.    PT Comments    Pt participated in stair training this session with wife present. Pt required max A to ascend/descend one step with one rail and one crutch. Pt then performed in seated position with good success. PT discussed with pt and pt's spouse that this would be the safest option at this time. Both expressed understanding and agreeable. PT will continue to follow acutely to progress mobility as tolerated.    Follow Up Recommendations  No PT follow up;Supervision for mobility/OOB     Equipment Recommendations  Rolling walker with 5" wheels    Recommendations for Other Services       Precautions / Restrictions Precautions Precautions: Fall Precaution Comments: wound VAC Restrictions Weight Bearing Restrictions: Yes LLE Weight Bearing: Non weight bearing    Mobility  Bed Mobility Overal bed mobility: Modified Independent                Transfers Overall transfer level: Needs assistance Equipment used: Rolling walker (2 wheeled);Crutches Transfers: Sit to/from [facility] Sit to Stand: Min assist;Min guard;Mod assist Stand pivot transfers: Min assist       General transfer comment: increased time and effort, cueing for safety; pt performed multiple times throughout session from EOB and from recliner chair. Pt requiring increased physical assistance when attempting to use bilateral axillary crutches, less assistance needed with use of RW  Ambulation/Gait Ambulation/Gait assistance: Min guard Ambulation Distance (Feet): 10 Feet(10' x2) Assistive device: Rolling walker (2 wheeled);Crutches Gait Pattern/deviations: (hop-to on L LE) Gait velocity: decreased Gait velocity interpretation: <1.31 ft/sec,  indicative of household ambulator General Gait Details: mild instability with either AD but no overt LOB or need for physical assistance, min guard for safety   Stairs Stairs: Yes Stairs assistance: Max assist;Min guard Stair Management: One rail Right;With crutches;Step to pattern;Forwards;Seated/boosting Number of Stairs: 2 General stair comments: pt able to ascend one step with R handrail and one axillary crutch on L with MAX A; pt then performed stairs in seated position, requiring min A to get into and out of seated position   Wheelchair Mobility    Modified Rankin (Stroke Patients Only)       Balance Overall balance assessment: Needs assistance Sitting-balance support: No upper extremity supported Sitting balance-Leahy Scale: Good     Standing balance support: During functional activity;Bilateral upper extremity supported Standing balance-Leahy Scale: Poor                              Cognition Arousal/Alertness: Awake/alert Behavior During Therapy: WFL for tasks assessed/performed Overall Cognitive Status: Within Functional Limits for tasks assessed                                        Exercises      General Comments        Pertinent Vitals/Pain Pain Assessment: Faces Faces Pain Scale: Hurts a little bit Pain Location: L LE Pain Descriptors / Indicators: Guarding Pain Intervention(s): Monitored during session;Repositioned    Home Living  Prior Function            PT Goals (current goals can now be found in the care plan section) Acute Rehab PT Goals PT Goal Formulation: With patient/family Time For Goal Achievement: 01/17/18 Potential to Achieve Goals: Good Progress towards PT goals: Progressing toward goals    Frequency    Min 5X/week      PT Plan Current plan remains appropriate    Co-evaluation              AM-PAC PT "6 Clicks" Daily Activity  Outcome Measure   Difficulty turning over in bed (including adjusting bedclothes, sheets and blankets)?: None Difficulty moving from lying on back to sitting on the side of the bed? : None Difficulty sitting down on and standing up from a chair with arms (e.g., wheelchair, bedside commode, etc,.)?: Unable Help needed moving to and from a bed to chair (including a wheelchair)?: A Little Help needed walking in hospital room?: A Little Help needed climbing 3-5 steps with a railing? : A Lot 6 Click Score: 17    End of Session Equipment Utilized During Treatment: Gait belt Activity Tolerance: Patient limited by fatigue Patient left: in bed;with call bell/phone within reach;with family/visitor present Nurse Communication: Mobility status PT Visit Diagnosis: Other abnormalities of gait and mobility (R26.89)     Time: 1287-8676 PT Time Calculation (min) (ACUTE ONLY): 33 min  Charges:  $Gait Training: 8-22 mins $Therapeutic Activity: 8-22 mins                    G Codes:      [place], [place], [place] 01/04/2018, 10:36 AM

## 2018-01-04 NOTE — Progress Notes (Signed)
Provided discharge education/instructions, all questions and concerns addressed, Pt to discharge home with belongings accompanied by wife.

## 2018-01-06 ENCOUNTER — Ambulatory Visit (INDEPENDENT_AMBULATORY_CARE_PROVIDER_SITE_OTHER): Payer: 59 | Admitting: Orthopedic Surgery

## 2018-01-06 MED FILL — HYDROCODON-APAP 5-325: 5-325 | 3 days supply | Qty: 30 | Fill #0

## 2018-01-08 ENCOUNTER — Ambulatory Visit (INDEPENDENT_AMBULATORY_CARE_PROVIDER_SITE_OTHER): Payer: 59 | Admitting: Orthopedic Surgery

## 2018-01-08 ENCOUNTER — Encounter: Payer: Self-pay | Admitting: Family Medicine

## 2018-01-08 ENCOUNTER — Other Ambulatory Visit: Payer: Self-pay | Admitting: *Deleted

## 2018-01-08 ENCOUNTER — Encounter (INDEPENDENT_AMBULATORY_CARE_PROVIDER_SITE_OTHER): Payer: Self-pay | Admitting: Family

## 2018-01-08 ENCOUNTER — Ambulatory Visit (INDEPENDENT_AMBULATORY_CARE_PROVIDER_SITE_OTHER): Payer: 59 | Admitting: Family

## 2018-01-08 VITALS — Ht 68.0 in | Wt 214.0 lb

## 2018-01-08 DIAGNOSIS — Z89432 Acquired absence of left foot: Secondary | ICD-10-CM

## 2018-01-08 NOTE — Patient Outreach (Addendum)
[facility]) Care Management  01/08/2018  Taylor Elliott 03-27-1951 [id number]   Subjective: Telephone call to patient's home / mobile number, spoke with patient, and HIPAA verified.  Discussed [facility] Care Management [facility] Transition of care follow up, patient voiced understanding, and is in agreement to follow up.    Patient states he is doing pretty good, has a follow up appointment with surgeon today, is looking forward to full recovery, and getting back to his normal routine.    States he is also looking forward to getting the wound vac off, it is getting in his way,  and is in agreement to continue to follow MD treatment plan.  Patient states he is able to manage self care and has assistance as needed with activities of daily living / home management.  Patient voices understanding of medical diagnosis, surgery, and treatment plan.  States he is accessing the following [facility] benefits: outpatient pharmacy, hospital indemnity (not chosen benefit), and does not need family medical leave act [employer]) at this time, and wife who is [employer] Employee is able to work from home as needed.  Patient states he does not have any education material, transition of care, care coordination, disease management, disease monitoring, transportation, community resource, or pharmacy needs at this time.  States he is very appreciative of the follow up and is in agreement to receive [place] Management information.     Objective: Per KPN (Knowledge Performance Now, point of care tool) and chart review, patient hospitalized 01/03/18 - 01/04/18 fpr Acquired contracture of Achilles tendon, left, Osteomyelitis ulceration left forefoot, status post Transmetatarsal amputation, Application of Prevena wound VAC, and Achilles tendon lengthening.     Patient also has a history of hypertension, prostate cancer, kidney cancer, Chronic kidney disease, Hypercholesteremia, Mastocytosis, Neuropathy, gout, and history of transmetatarsal  amputation of left foot.        Assessment: Received [facility] Transition of care referral on 01/07/18.   Transition of care follow up completed, no care management needs, and will proceed with case closure.       Plan: RNCM will send patient successful outreach letter, [facility] pamphlet, and magnet. RNCM will complete case closure due to follow up completed / no care management needs.         Damere Brandenburg H. Annia Friendly, BSN, [place] Management [facility] Telephonic CM Phone: [phone] Fax: [phone]
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
## 2018-01-13 ENCOUNTER — Ambulatory Visit (INDEPENDENT_AMBULATORY_CARE_PROVIDER_SITE_OTHER): Payer: 59 | Admitting: Orthopedic Surgery

## 2018-01-22 DIAGNOSIS — Z85528 Personal history of other malignant neoplasm of kidney: Secondary | ICD-10-CM | POA: Diagnosis not present

## 2018-01-23 ENCOUNTER — Encounter (INDEPENDENT_AMBULATORY_CARE_PROVIDER_SITE_OTHER): Payer: Self-pay | Admitting: Orthopedic Surgery

## 2018-01-23 ENCOUNTER — Ambulatory Visit (INDEPENDENT_AMBULATORY_CARE_PROVIDER_SITE_OTHER): Payer: 59 | Admitting: Orthopedic Surgery

## 2018-01-23 VITALS — Ht 68.0 in | Wt 214.0 lb

## 2018-01-23 DIAGNOSIS — Z89432 Acquired absence of left foot: Secondary | ICD-10-CM
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
## 2018-01-24 DIAGNOSIS — K439 Ventral hernia without obstruction or gangrene: Secondary | ICD-10-CM | POA: Diagnosis not present

## 2018-01-24 DIAGNOSIS — T63461D Toxic effect of venom of wasps, accidental (unintentional), subsequent encounter: Secondary | ICD-10-CM | POA: Diagnosis not present

## 2018-01-24 DIAGNOSIS — T63451D Toxic effect of venom of hornets, accidental (unintentional), subsequent encounter: Secondary | ICD-10-CM | POA: Diagnosis not present

## 2018-01-24 DIAGNOSIS — R911 Solitary pulmonary nodule: Secondary | ICD-10-CM | POA: Diagnosis not present

## 2018-01-24 DIAGNOSIS — Z85828 Personal history of other malignant neoplasm of skin: Secondary | ICD-10-CM | POA: Diagnosis not present

## 2018-01-24 DIAGNOSIS — Z8546 Personal history of malignant neoplasm of prostate: Secondary | ICD-10-CM | POA: Diagnosis not present

## 2018-01-29 DIAGNOSIS — D381 Neoplasm of uncertain behavior of trachea, bronchus and lung: Secondary | ICD-10-CM | POA: Diagnosis not present

## 2018-01-29 DIAGNOSIS — Z85528 Personal history of other malignant neoplasm of kidney: Secondary | ICD-10-CM | POA: Diagnosis not present

## 2018-01-29 DIAGNOSIS — Z8546 Personal history of malignant neoplasm of prostate: Secondary | ICD-10-CM | POA: Diagnosis not present

## 2018-02-06 ENCOUNTER — Other Ambulatory Visit: Payer: Self-pay | Admitting: Family Medicine

## 2018-02-06 DIAGNOSIS — E785 Hyperlipidemia, unspecified: Secondary | ICD-10-CM

## 2018-02-06 DIAGNOSIS — I10 Essential (primary) hypertension: Secondary | ICD-10-CM

## 2018-02-06 MED FILL — AMLODIPINE BESYLATE 10 MG T: 10 | 90 days supply | Qty: 90 | Fill #1

## 2018-02-06 MED FILL — LISINOPRIL 20 MG TABLET: 20 | 90 days supply | Qty: 90 | Fill #0

## 2018-02-06 MED FILL — ROSUVASTATIN CALCIUM 10 MG: 10 | 90 days supply | Qty: 90 | Fill #0

## 2018-02-11 DIAGNOSIS — T63441D Toxic effect of venom of bees, accidental (unintentional), subsequent encounter: Secondary | ICD-10-CM | POA: Diagnosis not present

## 2018-02-11 DIAGNOSIS — D4702 Systemic mastocytosis: Secondary | ICD-10-CM | POA: Diagnosis not present

## 2018-02-11 DIAGNOSIS — Z91030 Bee allergy status: Secondary | ICD-10-CM | POA: Diagnosis not present

## 2018-02-17 ENCOUNTER — Ambulatory Visit (INDEPENDENT_AMBULATORY_CARE_PROVIDER_SITE_OTHER): Payer: 59 | Admitting: Orthopedic Surgery

## 2018-02-17 ENCOUNTER — Encounter (INDEPENDENT_AMBULATORY_CARE_PROVIDER_SITE_OTHER): Payer: Self-pay | Admitting: Orthopedic Surgery

## 2018-02-17 VITALS — Ht 68.0 in | Wt 214.0 lb

## 2018-02-17 DIAGNOSIS — Z89432 Acquired absence of left foot: Secondary | ICD-10-CM
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
## 2018-02-26 DIAGNOSIS — T63451D Toxic effect of venom of hornets, accidental (unintentional), subsequent encounter: Secondary | ICD-10-CM | POA: Diagnosis not present

## 2018-02-26 DIAGNOSIS — T63461D Toxic effect of venom of wasps, accidental (unintentional), subsequent encounter: Secondary | ICD-10-CM | POA: Diagnosis not present

## 2018-03-11 ENCOUNTER — Other Ambulatory Visit: Payer: Self-pay | Admitting: Family Medicine

## 2018-03-11 DIAGNOSIS — F418 Other specified anxiety disorders: Secondary | ICD-10-CM

## 2018-03-11 MED FILL — SERTRALINE HCL 100 MG TAB: 100 | 90 days supply | Qty: 180 | Fill #0

## 2018-03-13 MED FILL — SOLIFENACIN SUCCINATE 10 MG: 10 | 90 days supply | Qty: 90 | Fill #2

## 2018-03-20 ENCOUNTER — Encounter (INDEPENDENT_AMBULATORY_CARE_PROVIDER_SITE_OTHER): Payer: Self-pay | Admitting: Orthopedic Surgery

## 2018-03-20 ENCOUNTER — Ambulatory Visit (INDEPENDENT_AMBULATORY_CARE_PROVIDER_SITE_OTHER): Payer: 59 | Admitting: Orthopedic Surgery

## 2018-03-20 VITALS — Ht 68.0 in | Wt 214.0 lb

## 2018-03-20 DIAGNOSIS — B351 Tinea unguium: Secondary | ICD-10-CM

## 2018-03-20 DIAGNOSIS — L98491 Non-pressure chronic ulcer of skin of other sites limited to breakdown of skin: Secondary | ICD-10-CM

## 2018-03-20 DIAGNOSIS — Z89432 Acquired absence of left foot: Secondary | ICD-10-CM
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
## 2018-04-08 DIAGNOSIS — T63451D Toxic effect of venom of hornets, accidental (unintentional), subsequent encounter: Secondary | ICD-10-CM | POA: Diagnosis not present

## 2018-04-08 DIAGNOSIS — T63441D Toxic effect of venom of bees, accidental (unintentional), subsequent encounter: Secondary | ICD-10-CM | POA: Diagnosis not present

## 2018-04-08 DIAGNOSIS — T63461D Toxic effect of venom of wasps, accidental (unintentional), subsequent encounter: Secondary | ICD-10-CM | POA: Diagnosis not present

## 2018-04-11 MED FILL — ALLOPURINOL 100 MG TABLET: 100 | 90 days supply | Qty: 180 | Fill #2

## 2018-04-17 ENCOUNTER — Ambulatory Visit (INDEPENDENT_AMBULATORY_CARE_PROVIDER_SITE_OTHER): Payer: 59 | Admitting: Orthopedic Surgery

## 2018-04-17 ENCOUNTER — Encounter (INDEPENDENT_AMBULATORY_CARE_PROVIDER_SITE_OTHER): Payer: Self-pay | Admitting: Orthopedic Surgery

## 2018-04-17 VITALS — Ht 68.0 in | Wt 214.0 lb

## 2018-04-17 DIAGNOSIS — L98491 Non-pressure chronic ulcer of skin of other sites limited to breakdown of skin: Secondary | ICD-10-CM

## 2018-04-17 DIAGNOSIS — Z89432 Acquired absence of left foot: Secondary | ICD-10-CM
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
## 2018-04-21 ENCOUNTER — Encounter (INDEPENDENT_AMBULATORY_CARE_PROVIDER_SITE_OTHER): Payer: Self-pay | Admitting: Orthopedic Surgery

## 2018-05-01 ENCOUNTER — Ambulatory Visit: Payer: 59 | Admitting: Family Medicine

## 2018-05-05 ENCOUNTER — Ambulatory Visit: Payer: 59 | Admitting: Family Medicine

## 2018-05-06 ENCOUNTER — Encounter: Payer: Self-pay | Admitting: Family Medicine

## 2018-05-06 ENCOUNTER — Ambulatory Visit: Payer: 59 | Admitting: Family Medicine

## 2018-05-06 VITALS — BP 110/50 | HR 85 | Temp 98.2°F | Resp 16 | Ht 68.0 in | Wt 211.4 lb

## 2018-05-06 DIAGNOSIS — M25511 Pain in right shoulder: Secondary | ICD-10-CM

## 2018-05-06 DIAGNOSIS — I10 Essential (primary) hypertension: Secondary | ICD-10-CM | POA: Insufficient documentation

## 2018-05-06 DIAGNOSIS — F32A Depression, unspecified: Secondary | ICD-10-CM

## 2018-05-06 DIAGNOSIS — C61 Malignant neoplasm of prostate: Secondary | ICD-10-CM | POA: Diagnosis not present

## 2018-05-06 DIAGNOSIS — Z23 Encounter for immunization: Secondary | ICD-10-CM | POA: Diagnosis not present

## 2018-05-06 DIAGNOSIS — F419 Anxiety disorder, unspecified: Secondary | ICD-10-CM

## 2018-05-06 DIAGNOSIS — M109 Gout, unspecified: Secondary | ICD-10-CM | POA: Diagnosis not present

## 2018-05-06 DIAGNOSIS — F329 Major depressive disorder, single episode, unspecified: Secondary | ICD-10-CM | POA: Diagnosis not present

## 2018-05-06 DIAGNOSIS — E785 Hyperlipidemia, unspecified: Secondary | ICD-10-CM | POA: Diagnosis not present

## 2018-05-06 DIAGNOSIS — Z0000 Encounter for general adult medical examination without abnormal findings: Secondary | ICD-10-CM

## 2018-05-06 MED ORDER — METHOCARBAMOL 500 MG PO TABS: ORAL_TABLET | ORAL | 0 refills | Status: DC

## 2018-05-06 MED ORDER — PAROXETINE HCL 20 MG PO TABS: 20.0000 mg | ORAL_TABLET | Freq: Every day | ORAL | 2 refills | Status: DC

## 2018-05-06 MED FILL — METHOCARBAMOL 500 MG TABLET: 500 | 10 days supply | Qty: 60 | Fill #0

## 2018-05-06 MED FILL — PARoxetine HCL 20 MG TABS: 20 | 30 days supply | Qty: 30 | Fill #0

## 2018-05-06 NOTE — Patient Instructions (Signed)
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
## 2018-05-06 NOTE — Assessment & Plan Note (Signed)
Not controlled  D/c zoloft Start paxil

## 2018-05-06 NOTE — Assessment & Plan Note (Signed)
Well controlled, no changes to meds. Encouraged heart healthy diet such as the DASH diet and exercise as tolerated.  °

## 2018-05-06 NOTE — Assessment & Plan Note (Signed)
Per u rology 

## 2018-05-06 NOTE — Assessment & Plan Note (Signed)
Tolerating statin, encouraged heart healthy diet, avoid trans fats, minimize simple carbs and saturated fats. Increase exercise as tolerated 

## 2018-05-07 LAB — CBC WITH DIFFERENTIAL/PLATELET
Basophils Absolute: 0.1 10*3/uL (ref 0.0–0.1)
Basophils Relative: 1.2 % (ref 0.0–3.0)
Eosinophils Absolute: 0.2 10*3/uL (ref 0.0–0.7)
Eosinophils Relative: 3.7 % (ref 0.0–5.0)
HCT: 40.9 % (ref 39.0–52.0)
Hemoglobin: 13.9 g/dL (ref 13.0–17.0)
Lymphocytes Relative: 10.7 % — ABNORMAL LOW (ref 12.0–46.0)
Lymphs Abs: 0.7 10*3/uL (ref 0.7–4.0)
MCHC: 33.9 g/dL (ref 30.0–36.0)
MCV: 92.4 fl (ref 78.0–100.0)
Monocytes Absolute: 0.5 10*3/uL (ref 0.1–1.0)
Monocytes Relative: 7.6 % (ref 3.0–12.0)
Neutro Abs: 5 10*3/uL (ref 1.4–7.7)
Neutrophils Relative %: 76.8 % (ref 43.0–77.0)
Platelets: 211 10*3/uL (ref 150.0–400.0)
RBC: 4.42 Mil/uL (ref 4.22–5.81)
RDW: 15.9 % — ABNORMAL HIGH (ref 11.5–15.5)
WBC: 6.5 10*3/uL (ref 4.0–10.5)

## 2018-05-07 LAB — COMPREHENSIVE METABOLIC PANEL
ALT: 14 U/L (ref 0–53)
AST: 11 U/L (ref 0–37)
Albumin: 4.1 g/dL (ref 3.5–5.2)
Alkaline Phosphatase: 96 U/L (ref 39–117)
BUN: 29 mg/dL — ABNORMAL HIGH (ref 6–23)
CO2: 26 mEq/L (ref 19–32)
Calcium: 9.2 mg/dL (ref 8.4–10.5)
Chloride: 105 mEq/L (ref 96–112)
Creatinine, Ser: 1.26 mg/dL (ref 0.40–1.50)
GFR: 60.85 mL/min (ref >60.00)
Glucose, Bld: 117 mg/dL — ABNORMAL HIGH (ref 70–99)
Potassium: 4.6 mEq/L (ref 3.5–5.1)
Sodium: 137 mEq/L (ref 135–145)
Total Bilirubin: 0.3 mg/dL (ref 0.2–1.2)
Total Protein: 6.6 g/dL (ref 6.0–8.3)

## 2018-05-07 LAB — LIPID PANEL
Cholesterol: 159 mg/dL (ref 0–200)
HDL: 34.9 mg/dL — ABNORMAL LOW (ref >39.00)
NonHDL: 124.49
Total CHOL/HDL Ratio: 5
Triglycerides: 275 mg/dL — ABNORMAL HIGH (ref 0.0–149.0)
VLDL: 55 mg/dL — ABNORMAL HIGH (ref 0.0–40.0)

## 2018-05-07 LAB — URIC ACID: Uric Acid, Serum: 5.7 mg/dL (ref 4.0–7.8)

## 2018-05-07 LAB — TSH: TSH: 1.49 u[IU]/mL (ref 0.35–4.50)

## 2018-05-07 LAB — LDL CHOLESTEROL, DIRECT: Direct LDL: 101 mg/dL

## 2018-05-15 ENCOUNTER — Encounter (INDEPENDENT_AMBULATORY_CARE_PROVIDER_SITE_OTHER): Payer: Self-pay | Admitting: Orthopedic Surgery

## 2018-05-15 ENCOUNTER — Other Ambulatory Visit: Payer: Self-pay

## 2018-05-15 ENCOUNTER — Ambulatory Visit (INDEPENDENT_AMBULATORY_CARE_PROVIDER_SITE_OTHER): Payer: 59

## 2018-05-15 ENCOUNTER — Ambulatory Visit (INDEPENDENT_AMBULATORY_CARE_PROVIDER_SITE_OTHER): Payer: 59 | Admitting: Orthopedic Surgery

## 2018-05-15 VITALS — Ht 68.0 in | Wt 211.4 lb

## 2018-05-15 DIAGNOSIS — L98491 Non-pressure chronic ulcer of skin of other sites limited to breakdown of skin: Secondary | ICD-10-CM | POA: Diagnosis not present

## 2018-05-15 DIAGNOSIS — M25511 Pain in right shoulder: Secondary | ICD-10-CM

## 2018-05-15 DIAGNOSIS — E785 Hyperlipidemia, unspecified: Secondary | ICD-10-CM

## 2018-05-15 DIAGNOSIS — Z89432 Acquired absence of left foot: Secondary | ICD-10-CM

## 2018-05-15 DIAGNOSIS — M7541 Impingement syndrome of right shoulder: Secondary | ICD-10-CM

## 2018-05-15 MED ORDER — LIDOCAINE HCL 1 % IJ SOLN
5.0000 mL | INTRAMUSCULAR | Status: AC | PRN
  Administered 2018-05-15: 5 mL

## 2018-05-15 MED ORDER — METHYLPREDNISOLONE ACETATE 40 MG/ML IJ SUSP
40.0000 mg | INTRAMUSCULAR | Status: AC | PRN
  Administered 2018-05-15: 40 mg via INTRA_ARTICULAR

## 2018-05-15 MED ORDER — FENOFIBRATE 160 MG PO TABS: 160.0000 mg | ORAL_TABLET | Freq: Every day | ORAL | 2 refills | Status: DC

## 2018-05-15 MED FILL — FENOFIBRATE 160 MG TABLET: 160 | 30 days supply | Qty: 30 | Fill #0
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
## 2018-05-19 DIAGNOSIS — T63451D Toxic effect of venom of hornets, accidental (unintentional), subsequent encounter: Secondary | ICD-10-CM | POA: Diagnosis not present

## 2018-05-29 MED FILL — ROSUVASTATIN CALCIUM 10 MG: 10 | 90 days supply | Qty: 90 | Fill #1

## 2018-05-29 MED FILL — AMLODIPINE BESYLATE 10 MG T: 10 | 90 days supply | Qty: 90 | Fill #2

## 2018-05-29 MED FILL — LISINOPRIL 20 MG TABLET: 20 | 90 days supply | Qty: 90 | Fill #1

## 2018-05-30 MED FILL — PARoxetine HCL 20 MG TABS: 20 | 30 days supply | Qty: 30 | Fill #1

## 2018-06-12 ENCOUNTER — Ambulatory Visit (INDEPENDENT_AMBULATORY_CARE_PROVIDER_SITE_OTHER): Payer: 59 | Admitting: Orthopedic Surgery

## 2018-06-12 ENCOUNTER — Encounter (INDEPENDENT_AMBULATORY_CARE_PROVIDER_SITE_OTHER): Payer: Self-pay | Admitting: Orthopedic Surgery

## 2018-06-12 VITALS — Ht 68.0 in | Wt 211.4 lb

## 2018-06-12 DIAGNOSIS — M7541 Impingement syndrome of right shoulder: Secondary | ICD-10-CM | POA: Diagnosis not present

## 2018-06-12 DIAGNOSIS — L98491 Non-pressure chronic ulcer of skin of other sites limited to breakdown of skin: Secondary | ICD-10-CM

## 2018-06-12 DIAGNOSIS — M7501 Adhesive capsulitis of right shoulder: Secondary | ICD-10-CM | POA: Diagnosis not present

## 2018-06-12 MED ORDER — METHYLPREDNISOLONE ACETATE 40 MG/ML IJ SUSP
40.0000 mg | INTRAMUSCULAR | Status: AC | PRN
  Administered 2018-06-12: 40 mg via INTRA_ARTICULAR

## 2018-06-12 MED ORDER — LIDOCAINE HCL 1 % IJ SOLN
5.0000 mL | INTRAMUSCULAR | Status: AC | PRN
  Administered 2018-06-12: 5 mL

## 2018-06-12 MED ORDER — HYDROCODONE-ACETAMINOPHEN 5-325 MG PO TABS: 1.0000 | ORAL_TABLET | Freq: Four times a day (QID) | ORAL | 0 refills | Status: DC | PRN

## 2018-06-12 MED FILL — HYDROCODON-APAP 5-325: 5-325 | 7 days supply | Qty: 28 | Fill #0
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
## 2018-06-18 MED FILL — SOLIFENACIN SUCCINATE 10 MG: 10 | 90 days supply | Qty: 90 | Fill #3

## 2018-06-18 MED FILL — FENOFIBRATE 160 MG TABLET: 160 | 30 days supply | Qty: 30 | Fill #1

## 2018-06-19 DIAGNOSIS — H26493 Other secondary cataract, bilateral: Secondary | ICD-10-CM | POA: Diagnosis not present

## 2018-06-19 DIAGNOSIS — T63441D Toxic effect of venom of bees, accidental (unintentional), subsequent encounter: Secondary | ICD-10-CM | POA: Diagnosis not present

## 2018-06-19 DIAGNOSIS — H40013 Open angle with borderline findings, low risk, bilateral: Secondary | ICD-10-CM | POA: Diagnosis not present

## 2018-06-19 DIAGNOSIS — T63461D Toxic effect of venom of wasps, accidental (unintentional), subsequent encounter: Secondary | ICD-10-CM | POA: Diagnosis not present

## 2018-06-19 DIAGNOSIS — H35033 Hypertensive retinopathy, bilateral: Secondary | ICD-10-CM | POA: Diagnosis not present

## 2018-06-19 DIAGNOSIS — T63451D Toxic effect of venom of hornets, accidental (unintentional), subsequent encounter: Secondary | ICD-10-CM | POA: Diagnosis not present

## 2018-06-19 DIAGNOSIS — H35372 Puckering of macula, left eye: Secondary | ICD-10-CM | POA: Diagnosis not present

## 2018-07-01 ENCOUNTER — Encounter: Payer: Self-pay | Admitting: Family Medicine

## 2018-07-01 NOTE — Telephone Encounter (Signed)
All of the meds in the same category of paxil has dizziness as a possible side effect--- although rare Can try fetzima  40 mg #30  1 po qd ----  F/u 1 month Sounds like he may need ov Allopurinol normally does not cause dizziness

## 2018-07-04 ENCOUNTER — Other Ambulatory Visit: Payer: Self-pay

## 2018-07-04 ENCOUNTER — Encounter: Payer: Self-pay | Admitting: Medical

## 2018-07-04 ENCOUNTER — Encounter (HOSPITAL_BASED_OUTPATIENT_CLINIC_OR_DEPARTMENT_OTHER): Payer: Self-pay

## 2018-07-04 ENCOUNTER — Telehealth: Payer: Self-pay

## 2018-07-04 ENCOUNTER — Telehealth: Payer: Self-pay | Admitting: Emergency Medicine

## 2018-07-04 ENCOUNTER — Ambulatory Visit: Payer: 59 | Admitting: Medical

## 2018-07-04 ENCOUNTER — Emergency Department (HOSPITAL_BASED_OUTPATIENT_CLINIC_OR_DEPARTMENT_OTHER): Payer: 59

## 2018-07-04 ENCOUNTER — Inpatient Hospital Stay (HOSPITAL_BASED_OUTPATIENT_CLINIC_OR_DEPARTMENT_OTHER)
Admission: EM | Admit: 2018-07-04 | Discharge: 2018-07-16 | DRG: 643 | Disposition: A | Discharge status: Home Health | Payer: 59 | Attending: Internal Medicine | Admitting: Internal Medicine

## 2018-07-04 ENCOUNTER — Ambulatory Visit (HOSPITAL_BASED_OUTPATIENT_CLINIC_OR_DEPARTMENT_OTHER)
Admission: RE | Admit: 2018-07-04 | Discharge: 2018-07-04 | Disposition: A | Discharge status: Home / Self Care | Payer: 59 | Source: Ambulatory Visit | Attending: Medical | Admitting: Medical

## 2018-07-04 VITALS — BP 132/80 | HR 80 | Temp 97.7°F | Resp 16 | Ht 68.0 in

## 2018-07-04 DIAGNOSIS — H518 Other specified disorders of binocular movement: Secondary | ICD-10-CM | POA: Diagnosis present

## 2018-07-04 DIAGNOSIS — R197 Diarrhea, unspecified: Secondary | ICD-10-CM | POA: Insufficient documentation

## 2018-07-04 DIAGNOSIS — I129 Hypertensive chronic kidney disease with stage 1 through stage 4 chronic kidney disease, or unspecified chronic kidney disease: Secondary | ICD-10-CM | POA: Diagnosis present

## 2018-07-04 DIAGNOSIS — Z452 Encounter for adjustment and management of vascular access device: Secondary | ICD-10-CM | POA: Diagnosis not present

## 2018-07-04 DIAGNOSIS — Z905 Acquired absence of kidney: Secondary | ICD-10-CM

## 2018-07-04 DIAGNOSIS — F419 Anxiety disorder, unspecified: Secondary | ICD-10-CM | POA: Diagnosis not present

## 2018-07-04 DIAGNOSIS — R9431 Abnormal electrocardiogram [ECG] [EKG]: Secondary | ICD-10-CM | POA: Diagnosis not present

## 2018-07-04 DIAGNOSIS — R269 Unspecified abnormalities of gait and mobility: Secondary | ICD-10-CM | POA: Diagnosis present

## 2018-07-04 DIAGNOSIS — G9340 Encephalopathy, unspecified: Secondary | ICD-10-CM | POA: Diagnosis not present

## 2018-07-04 DIAGNOSIS — G930 Cerebral cysts: Secondary | ICD-10-CM | POA: Diagnosis present

## 2018-07-04 DIAGNOSIS — R05 Cough: Secondary | ICD-10-CM | POA: Insufficient documentation

## 2018-07-04 DIAGNOSIS — Z8719 Personal history of other diseases of the digestive system: Secondary | ICD-10-CM

## 2018-07-04 DIAGNOSIS — Z7982 Long term (current) use of aspirin: Secondary | ICD-10-CM

## 2018-07-04 DIAGNOSIS — L97511 Non-pressure chronic ulcer of other part of right foot limited to breakdown of skin: Secondary | ICD-10-CM | POA: Diagnosis present

## 2018-07-04 DIAGNOSIS — Z85528 Personal history of other malignant neoplasm of kidney: Secondary | ICD-10-CM

## 2018-07-04 DIAGNOSIS — R059 Cough, unspecified: Secondary | ICD-10-CM

## 2018-07-04 DIAGNOSIS — F064 Anxiety disorder due to known physiological condition: Secondary | ICD-10-CM | POA: Diagnosis present

## 2018-07-04 DIAGNOSIS — G92 Toxic encephalopathy: Secondary | ICD-10-CM | POA: Diagnosis present

## 2018-07-04 DIAGNOSIS — K219 Gastro-esophageal reflux disease without esophagitis: Secondary | ICD-10-CM | POA: Diagnosis present

## 2018-07-04 DIAGNOSIS — E669 Obesity, unspecified: Secondary | ICD-10-CM | POA: Diagnosis present

## 2018-07-04 DIAGNOSIS — R4182 Altered mental status, unspecified: Secondary | ICD-10-CM | POA: Diagnosis not present

## 2018-07-04 DIAGNOSIS — R569 Unspecified convulsions: Secondary | ICD-10-CM | POA: Diagnosis not present

## 2018-07-04 DIAGNOSIS — I10 Essential (primary) hypertension: Secondary | ICD-10-CM | POA: Diagnosis not present

## 2018-07-04 DIAGNOSIS — Z79899 Other long term (current) drug therapy: Secondary | ICD-10-CM

## 2018-07-04 DIAGNOSIS — L97519 Non-pressure chronic ulcer of other part of right foot with unspecified severity: Secondary | ICD-10-CM | POA: Diagnosis present

## 2018-07-04 DIAGNOSIS — R253 Fasciculation: Secondary | ICD-10-CM | POA: Diagnosis present

## 2018-07-04 DIAGNOSIS — Z881 Allergy status to other antibiotic agents status: Secondary | ICD-10-CM

## 2018-07-04 DIAGNOSIS — Z91030 Bee allergy status: Secondary | ICD-10-CM

## 2018-07-04 DIAGNOSIS — E871 Hypo-osmolality and hyponatremia: Secondary | ICD-10-CM | POA: Diagnosis not present

## 2018-07-04 DIAGNOSIS — E861 Hypovolemia: Secondary | ICD-10-CM | POA: Diagnosis present

## 2018-07-04 DIAGNOSIS — N181 Chronic kidney disease, stage 1: Secondary | ICD-10-CM | POA: Diagnosis present

## 2018-07-04 DIAGNOSIS — R4789 Other speech disturbances: Secondary | ICD-10-CM | POA: Diagnosis not present

## 2018-07-04 DIAGNOSIS — F329 Major depressive disorder, single episode, unspecified: Secondary | ICD-10-CM | POA: Diagnosis not present

## 2018-07-04 DIAGNOSIS — Z8249 Family history of ischemic heart disease and other diseases of the circulatory system: Secondary | ICD-10-CM

## 2018-07-04 DIAGNOSIS — G40209 Localization-related (focal) (partial) symptomatic epilepsy and epileptic syndromes with complex partial seizures, not intractable, without status epilepticus: Secondary | ICD-10-CM | POA: Diagnosis present

## 2018-07-04 DIAGNOSIS — Z89432 Acquired absence of left foot: Secondary | ICD-10-CM

## 2018-07-04 DIAGNOSIS — G629 Polyneuropathy, unspecified: Secondary | ICD-10-CM | POA: Diagnosis present

## 2018-07-04 DIAGNOSIS — E222 Syndrome of inappropriate secretion of antidiuretic hormone: Secondary | ICD-10-CM | POA: Diagnosis present

## 2018-07-04 DIAGNOSIS — T380X5A Adverse effect of glucocorticoids and synthetic analogues, initial encounter: Secondary | ICD-10-CM | POA: Diagnosis present

## 2018-07-04 DIAGNOSIS — R110 Nausea: Secondary | ICD-10-CM

## 2018-07-04 DIAGNOSIS — R739 Hyperglycemia, unspecified: Secondary | ICD-10-CM | POA: Diagnosis present

## 2018-07-04 DIAGNOSIS — N39498 Other specified urinary incontinence: Secondary | ICD-10-CM | POA: Diagnosis present

## 2018-07-04 DIAGNOSIS — Z716 Tobacco abuse counseling: Secondary | ICD-10-CM | POA: Diagnosis not present

## 2018-07-04 DIAGNOSIS — R195 Other fecal abnormalities: Secondary | ICD-10-CM | POA: Diagnosis not present

## 2018-07-04 DIAGNOSIS — C61 Malignant neoplasm of prostate: Secondary | ICD-10-CM | POA: Diagnosis present

## 2018-07-04 DIAGNOSIS — Z8546 Personal history of malignant neoplasm of prostate: Secondary | ICD-10-CM

## 2018-07-04 DIAGNOSIS — R918 Other nonspecific abnormal finding of lung field: Secondary | ICD-10-CM | POA: Diagnosis present

## 2018-07-04 DIAGNOSIS — F17210 Nicotine dependence, cigarettes, uncomplicated: Secondary | ICD-10-CM | POA: Diagnosis present

## 2018-07-04 DIAGNOSIS — R4781 Slurred speech: Secondary | ICD-10-CM | POA: Diagnosis not present

## 2018-07-04 DIAGNOSIS — R42 Dizziness and giddiness: Secondary | ICD-10-CM | POA: Diagnosis not present

## 2018-07-04 DIAGNOSIS — M7541 Impingement syndrome of right shoulder: Secondary | ICD-10-CM | POA: Diagnosis not present

## 2018-07-04 DIAGNOSIS — Z8379 Family history of other diseases of the digestive system: Secondary | ICD-10-CM

## 2018-07-04 DIAGNOSIS — Z720 Tobacco use: Secondary | ICD-10-CM | POA: Diagnosis not present

## 2018-07-04 DIAGNOSIS — R4701 Aphasia: Secondary | ICD-10-CM | POA: Diagnosis present

## 2018-07-04 DIAGNOSIS — Z0000 Encounter for general adult medical examination without abnormal findings: Secondary | ICD-10-CM | POA: Diagnosis not present

## 2018-07-04 DIAGNOSIS — R911 Solitary pulmonary nodule: Secondary | ICD-10-CM | POA: Diagnosis not present

## 2018-07-04 DIAGNOSIS — Z6830 Body mass index (BMI) 30.0-30.9, adult: Secondary | ICD-10-CM

## 2018-07-04 DIAGNOSIS — Z7951 Long term (current) use of inhaled steroids: Secondary | ICD-10-CM

## 2018-07-04 DIAGNOSIS — E785 Hyperlipidemia, unspecified: Secondary | ICD-10-CM | POA: Diagnosis present

## 2018-07-04 DIAGNOSIS — D49519 Neoplasm of unspecified behavior of unspecified kidney: Secondary | ICD-10-CM | POA: Diagnosis not present

## 2018-07-04 DIAGNOSIS — R0989 Other specified symptoms and signs involving the circulatory and respiratory systems: Secondary | ICD-10-CM | POA: Diagnosis not present

## 2018-07-04 DIAGNOSIS — Z801 Family history of malignant neoplasm of trachea, bronchus and lung: Secondary | ICD-10-CM

## 2018-07-04 DIAGNOSIS — G9389 Other specified disorders of brain: Secondary | ICD-10-CM | POA: Diagnosis not present

## 2018-07-04 DIAGNOSIS — Z9079 Acquired absence of other genital organ(s): Secondary | ICD-10-CM

## 2018-07-04 DIAGNOSIS — R471 Dysarthria and anarthria: Secondary | ICD-10-CM | POA: Diagnosis present

## 2018-07-04 DIAGNOSIS — G9341 Metabolic encephalopathy: Secondary | ICD-10-CM | POA: Diagnosis not present

## 2018-07-04 DIAGNOSIS — F411 Generalized anxiety disorder: Secondary | ICD-10-CM | POA: Diagnosis not present

## 2018-07-04 DIAGNOSIS — E7800 Pure hypercholesterolemia, unspecified: Secondary | ICD-10-CM | POA: Diagnosis present

## 2018-07-04 LAB — PROTIME-INR
INR: 0.92
Prothrombin Time: 12.3 seconds (ref 11.4–15.2)

## 2018-07-04 LAB — CBC WITH DIFFERENTIAL/PLATELET
Abs Immature Granulocytes: 0.02 10*3/uL (ref 0.00–0.07)
Basophils Absolute: 0 10*3/uL (ref 0.0–0.1)
Basophils Absolute: 0 10*3/uL (ref 0.0–0.1)
Basophils Relative: 0.5 % (ref 0.0–3.0)
Basophils Relative: 0 %
Eosinophils Absolute: 0 10*3/uL (ref 0.0–0.7)
Eosinophils Absolute: 0 10*3/uL (ref 0.0–0.5)
Eosinophils Relative: 0.8 % (ref 0.0–5.0)
Eosinophils Relative: 1 %
HCT: 38.1 % — ABNORMAL LOW (ref 39.0–52.0)
HCT: 36.1 % — ABNORMAL LOW (ref 39.0–52.0)
Hemoglobin: 13.5 g/dL (ref 13.0–17.0)
Hemoglobin: 12.9 g/dL — ABNORMAL LOW (ref 13.0–17.0)
Immature Granulocytes: 0 %
Lymphocytes Relative: 8.7 % — ABNORMAL LOW (ref 12.0–46.0)
Lymphocytes Relative: 8 %
Lymphs Abs: 0.5 10*3/uL — ABNORMAL LOW (ref 0.7–4.0)
Lymphs Abs: 0.4 10*3/uL — ABNORMAL LOW (ref 0.7–4.0)
MCH: 30.3 pg (ref 26.0–34.0)
MCHC: 35.5 g/dL (ref 30.0–36.0)
MCHC: 35.7 g/dL (ref 30.0–36.0)
MCV: 87.6 fl (ref 78.0–100.0)
MCV: 84.7 fL (ref 80.0–100.0)
Monocytes Absolute: 0.4 10*3/uL (ref 0.1–1.0)
Monocytes Absolute: 0.5 10*3/uL (ref 0.1–1.0)
Monocytes Relative: 7.9 % (ref 3.0–12.0)
Monocytes Relative: 10 %
Neutro Abs: 4.4 10*3/uL (ref 1.4–7.7)
Neutro Abs: 4.2 10*3/uL (ref 1.7–7.7)
Neutrophils Relative %: 82.1 % — ABNORMAL HIGH (ref 43.0–77.0)
Neutrophils Relative %: 81 %
Platelets: 180 10*3/uL (ref 150.0–400.0)
Platelets: 170 10*3/uL (ref 150–400)
RBC: 4.35 Mil/uL (ref 4.22–5.81)
RBC: 4.26 MIL/uL (ref 4.22–5.81)
RDW: 14.6 % (ref 11.5–15.5)
RDW: 13.1 % (ref 11.5–15.5)
WBC: 5.3 10*3/uL (ref 4.0–10.5)
WBC: 5.2 10*3/uL (ref 4.0–10.5)
nRBC: 0 % (ref 0.0–0.2)

## 2018-07-04 LAB — COMPREHENSIVE METABOLIC PANEL
ALT: 27 U/L (ref 0–53)
AST: 22 U/L (ref 0–37)
Albumin: 4.1 g/dL (ref 3.5–5.2)
Alkaline Phosphatase: 108 U/L (ref 39–117)
BUN: 8 mg/dL (ref 6–23)
CO2: 25 mEq/L (ref 19–32)
Calcium: 8.9 mg/dL (ref 8.4–10.5)
Chloride: 81 mEq/L — ABNORMAL LOW (ref 96–112)
Creatinine, Ser: 0.73 mg/dL (ref 0.40–1.50)
GFR: 114.18 mL/min (ref >60.00)
Glucose, Bld: 98 mg/dL (ref 70–99)
Potassium: 4.4 mEq/L (ref 3.5–5.1)
Sodium: 113 mEq/L — CL (ref 135–145)
Total Bilirubin: 0.5 mg/dL (ref 0.2–1.2)
Total Protein: 6.4 g/dL (ref 6.0–8.3)

## 2018-07-04 LAB — URINALYSIS, ROUTINE W REFLEX MICROSCOPIC
Glucose, UA: NEGATIVE mg/dL
Ketones, ur: 15 mg/dL — AB
Nitrite: NEGATIVE
Protein, ur: 100 mg/dL — AB
Specific Gravity, Urine: 1.02 (ref 1.005–1.030)
pH: 7 (ref 5.0–8.0)

## 2018-07-04 LAB — BASIC METABOLIC PANEL
Anion gap: 8 (ref 5–15)
BUN: 9 mg/dL (ref 8–23)
CO2: 21 mmol/L — ABNORMAL LOW (ref 22–32)
Calcium: 8.6 mg/dL — ABNORMAL LOW (ref 8.9–10.3)
Chloride: 83 mmol/L — ABNORMAL LOW (ref 98–111)
Creatinine, Ser: 0.73 mg/dL (ref 0.61–1.24)
GFR calc Af Amer: >60 mL/min (ref >60)
GFR calc non Af Amer: >60 mL/min (ref >60)
Glucose, Bld: 108 mg/dL — ABNORMAL HIGH (ref 70–99)
Potassium: 4.1 mmol/L (ref 3.5–5.1)
Sodium: 112 mmol/L — CL (ref 135–145)

## 2018-07-04 LAB — OSMOLALITY, URINE: Osmolality, Ur: 712 mOsm/kg (ref 300–900)

## 2018-07-04 LAB — URINALYSIS, MICROSCOPIC (REFLEX)

## 2018-07-04 LAB — PROTEIN, CSF: Total  Protein, CSF: 126 mg/dL — ABNORMAL HIGH (ref 15–45)

## 2018-07-04 LAB — CBG MONITORING, ED: Glucose-Capillary: 96 mg/dL (ref 70–99)

## 2018-07-04 LAB — MAGNESIUM: Magnesium: 1.7 mg/dL (ref 1.7–2.4)

## 2018-07-04 LAB — OSMOLALITY: Osmolality: 239 mOsm/kg — CL (ref 275–295)

## 2018-07-04 LAB — AMYLASE: Amylase: 49 U/L (ref 27–131)

## 2018-07-04 LAB — APTT: aPTT: 39 seconds — ABNORMAL HIGH (ref 24–36)

## 2018-07-04 LAB — GLUCOSE, CSF: Glucose, CSF: 55 mg/dL (ref 40–70)

## 2018-07-04 LAB — LIPASE: Lipase: 26 U/L (ref 11.0–59.0)

## 2018-07-04 MED ORDER — FLUTICASONE PROPIONATE 50 MCG/ACT NA SUSP: 2.0000 | Freq: Every day | NASAL | 1 refills | Status: DC

## 2018-07-04 MED ORDER — LIDOCAINE-EPINEPHRINE (PF) 2 %-1:200000 IJ SOLN
INTRAMUSCULAR | Status: AC
  Filled 2018-07-04: qty 10

## 2018-07-04 MED ORDER — CEPHALEXIN 500 MG PO CAPS: 500.0000 mg | ORAL_CAPSULE | Freq: Two times a day (BID) | ORAL | 0 refills | Status: DC

## 2018-07-04 MED ORDER — BENZONATATE 100 MG PO CAPS: 100.0000 mg | ORAL_CAPSULE | Freq: Three times a day (TID) | ORAL | 0 refills | Status: DC | PRN

## 2018-07-04 MED ORDER — LIDOCAINE-EPINEPHRINE 2 %-1:100000 IJ SOLN
20.0000 mL | Freq: Once | INTRAMUSCULAR | Status: DC
  Filled 2018-07-04: qty 20

## 2018-07-04 MED ORDER — ONDANSETRON 4 MG PO TBDP: 4.0000 mg | ORAL_TABLET | Freq: Three times a day (TID) | ORAL | 0 refills | Status: DC | PRN

## 2018-07-04 MED FILL — BENZONATATE 100 MG CAP: 100 | 10 days supply | Qty: 30 | Fill #0

## 2018-07-04 MED FILL — FLUTICASONE PROP 50 MCG SPR: 50 | 30 days supply | Qty: 16 | Fill #0

## 2018-07-04 NOTE — Telephone Encounter (Signed)
Thanks for contacting patient. Agree with Melissa.
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
## 2018-07-04 NOTE — Telephone Encounter (Signed)
Noted  

## 2018-07-04 NOTE — Telephone Encounter (Signed)
"  CRITICAL VALUE STICKER  CRITICAL VALUE:Sodium 113  RECEIVER (on-site recipient of call):Dontavis Tschantz  DATE & TIME NOTIFIED: 07-04-18 1443  MESSENGER (representative from lab):Saa  MD NOTIFIED: Saguier  TIME OF NOTIFICATION:Melissa  RESPONSE: See previous message from [facility]

## 2018-07-04 NOTE — ED Triage Notes (Addendum)
Pt sent from upstairs for sodium of 113, had multiple episodes of n/v/d last week that has since subsided, denies any complaints at this time other than generally not feeling well, but states he is feeling better than last week

## 2018-07-04 NOTE — ED Notes (Signed)
ED Provider at bedside. 

## 2018-07-04 NOTE — Telephone Encounter (Signed)
Pt was advised to go to ED per [facility]  for evaluation because sodium level is very low(113). Pt states he will do his best to get to ED.

## 2018-07-04 NOTE — Telephone Encounter (Signed)
Disregard this note. 

## 2018-07-04 NOTE — Telephone Encounter (Signed)
Patient called back wanting to know again how did his labs come back so fast, I explained to him that we have carriers come in through out the day to pick lab work up and it is then processed. Blood does not wait to be done at the end of the day.  He also stated that he felt fine, and what was the reason he should go to the ER. Patient was advised that he needed an IV and that hsi kidney functions could go possibly drop if this is not handled. He agreed and stated he was on his way to be seen.

## 2018-07-04 NOTE — Patient Instructions (Addendum)
For your recent described moderate to severe diarrhea at times, I do want you to collect stool sample today if possible as you reported some loose stools last night and almost had a loose stool while waiting to be seen today.  This would help [place] rule out any potential infectious causes including C. Difficile.   Also want you to get metabolic panel and CBC today.  For recent nausea over the last week decided to include amylase and lipase as well.  We will see if he has any electrolyte abnormalities that  might be associated with slightly off balance- lightheadedness.  If you do have recurrent watery loose stools/diarrhea then could restart Imodium.  But not recommend using that unless needed.  For recent nasal congestion, I prescribed Flonase. For recent cough, I prescribed benzonatate.  By exam today I do not see any overt sinus infection and on  auscultation of the lungs I do not hear any obvious sounds of pneumonia.  However will get chest x-ray today to make sure no areas seen.  You requested/ indicated you thought you needed antibiotic.  This might be the case but I do want to get results of abdomen x-ray, see how you do in regards to your loose stools, and follow chest x-ray results first.  If you start to have obvious sinus pressure/infectious symptoms or chest congestion then you could start Keflex antibiotic.  But I am providing this as a printed prescription to use only if needed.  Note also if pneumonia seen on x-ray then you might need another antibiotic.  We will be contacting you by my chart on study results or calling you by phone if needed.  Follow-up in 7 days or as needed.
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
## 2018-07-05 ENCOUNTER — Encounter (HOSPITAL_COMMUNITY): Payer: Self-pay

## 2018-07-05 DIAGNOSIS — D49519 Neoplasm of unspecified behavior of unspecified kidney: Secondary | ICD-10-CM

## 2018-07-05 DIAGNOSIS — Z716 Tobacco abuse counseling: Secondary | ICD-10-CM

## 2018-07-05 DIAGNOSIS — R911 Solitary pulmonary nodule: Secondary | ICD-10-CM

## 2018-07-05 DIAGNOSIS — I10 Essential (primary) hypertension: Secondary | ICD-10-CM

## 2018-07-05 DIAGNOSIS — Z720 Tobacco use: Secondary | ICD-10-CM

## 2018-07-05 DIAGNOSIS — E871 Hypo-osmolality and hyponatremia: Secondary | ICD-10-CM

## 2018-07-05 LAB — CBC WITH DIFFERENTIAL/PLATELET
Abs Immature Granulocytes: 0.02 10*3/uL (ref 0.00–0.07)
Basophils Absolute: 0 10*3/uL (ref 0.0–0.1)
Basophils Relative: 1 %
Eosinophils Absolute: 0.1 10*3/uL (ref 0.0–0.5)
Eosinophils Relative: 1 %
HCT: 36.1 % — ABNORMAL LOW (ref 39.0–52.0)
Hemoglobin: 12.5 g/dL — ABNORMAL LOW (ref 13.0–17.0)
Immature Granulocytes: 1 %
Lymphocytes Relative: 11 %
Lymphs Abs: 0.4 10*3/uL — ABNORMAL LOW (ref 0.7–4.0)
MCH: 29.5 pg (ref 26.0–34.0)
MCHC: 34.6 g/dL (ref 30.0–36.0)
MCV: 85.1 fL (ref 80.0–100.0)
Monocytes Absolute: 0.5 10*3/uL (ref 0.1–1.0)
Monocytes Relative: 11 %
Neutro Abs: 3.1 10*3/uL (ref 1.7–7.7)
Neutrophils Relative %: 75 %
Platelets: 168 10*3/uL (ref 150–400)
RBC: 4.24 MIL/uL (ref 4.22–5.81)
RDW: 12.9 % (ref 11.5–15.5)
WBC: 4.1 10*3/uL (ref 4.0–10.5)
nRBC: 0 % (ref 0.0–0.2)

## 2018-07-05 LAB — BASIC METABOLIC PANEL
Anion gap: 11 (ref 5–15)
Anion gap: 10 (ref 5–15)
Anion gap: 12 (ref 5–15)
Anion gap: 12 (ref 5–15)
Anion gap: 9 (ref 5–15)
Anion gap: 10 (ref 5–15)
BUN: 8 mg/dL (ref 8–23)
BUN: 8 mg/dL (ref 8–23)
BUN: 7 mg/dL — ABNORMAL LOW (ref 8–23)
BUN: 8 mg/dL (ref 8–23)
BUN: 15 mg/dL (ref 8–23)
BUN: 15 mg/dL (ref 8–23)
CO2: 23 mmol/L (ref 22–32)
CO2: 23 mmol/L (ref 22–32)
CO2: 23 mmol/L (ref 22–32)
CO2: 21 mmol/L — ABNORMAL LOW (ref 22–32)
CO2: 23 mmol/L (ref 22–32)
CO2: 23 mmol/L (ref 22–32)
Calcium: 8.6 mg/dL — ABNORMAL LOW (ref 8.9–10.3)
Calcium: 8.3 mg/dL — ABNORMAL LOW (ref 8.9–10.3)
Calcium: 8.4 mg/dL — ABNORMAL LOW (ref 8.9–10.3)
Calcium: 8.4 mg/dL — ABNORMAL LOW (ref 8.9–10.3)
Calcium: 8.6 mg/dL — ABNORMAL LOW (ref 8.9–10.3)
Calcium: 8.4 mg/dL — ABNORMAL LOW (ref 8.9–10.3)
Chloride: 82 mmol/L — ABNORMAL LOW (ref 98–111)
Chloride: 86 mmol/L — ABNORMAL LOW (ref 98–111)
Chloride: 84 mmol/L — ABNORMAL LOW (ref 98–111)
Chloride: 82 mmol/L — ABNORMAL LOW (ref 98–111)
Chloride: 86 mmol/L — ABNORMAL LOW (ref 98–111)
Chloride: 82 mmol/L — ABNORMAL LOW (ref 98–111)
Creatinine, Ser: 0.84 mg/dL (ref 0.61–1.24)
Creatinine, Ser: 0.79 mg/dL (ref 0.61–1.24)
Creatinine, Ser: 0.87 mg/dL (ref 0.61–1.24)
Creatinine, Ser: 0.69 mg/dL (ref 0.61–1.24)
Creatinine, Ser: 1 mg/dL (ref 0.61–1.24)
Creatinine, Ser: 0.88 mg/dL (ref 0.61–1.24)
GFR calc Af Amer: >60 mL/min (ref >60)
GFR calc Af Amer: >60 mL/min (ref >60)
GFR calc Af Amer: >60 mL/min (ref >60)
GFR calc Af Amer: >60 mL/min (ref >60)
GFR calc Af Amer: >60 mL/min (ref >60)
GFR calc Af Amer: >60 mL/min (ref >60)
GFR calc non Af Amer: >60 mL/min (ref >60)
GFR calc non Af Amer: >60 mL/min (ref >60)
GFR calc non Af Amer: >60 mL/min (ref >60)
GFR calc non Af Amer: >60 mL/min (ref >60)
GFR calc non Af Amer: >60 mL/min (ref >60)
GFR calc non Af Amer: >60 mL/min (ref >60)
Glucose, Bld: 112 mg/dL — ABNORMAL HIGH (ref 70–99)
Glucose, Bld: 132 mg/dL — ABNORMAL HIGH (ref 70–99)
Glucose, Bld: 162 mg/dL — ABNORMAL HIGH (ref 70–99)
Glucose, Bld: 79 mg/dL (ref 70–99)
Glucose, Bld: 112 mg/dL — ABNORMAL HIGH (ref 70–99)
Glucose, Bld: 138 mg/dL — ABNORMAL HIGH (ref 70–99)
Potassium: 3.8 mmol/L (ref 3.5–5.1)
Potassium: 4 mmol/L (ref 3.5–5.1)
Potassium: 3.7 mmol/L (ref 3.5–5.1)
Potassium: 3.7 mmol/L (ref 3.5–5.1)
Potassium: 4.1 mmol/L (ref 3.5–5.1)
Potassium: 4.2 mmol/L (ref 3.5–5.1)
Sodium: 116 mmol/L — CL (ref 135–145)
Sodium: 119 mmol/L — CL (ref 135–145)
Sodium: 119 mmol/L — CL (ref 135–145)
Sodium: 115 mmol/L — CL (ref 135–145)
Sodium: 118 mmol/L — CL (ref 135–145)
Sodium: 115 mmol/L — CL (ref 135–145)

## 2018-07-05 LAB — CORTISOL: Cortisol, Plasma: 6.2 ug/dL

## 2018-07-05 LAB — CSF CELL COUNT WITH DIFFERENTIAL
RBC Count, CSF: 1 /mm3 — ABNORMAL HIGH
Tube #: 1
WBC, CSF: 1 /mm3 (ref 0–5)

## 2018-07-05 LAB — NA AND K (SODIUM & POTASSIUM), RAND UR
Potassium Urine: 30 mmol/L
Sodium, Ur: 57 mmol/L

## 2018-07-05 LAB — TSH: TSH: 1.674 u[IU]/mL (ref 0.350–4.500)

## 2018-07-05 LAB — MRSA PCR SCREENING: MRSA by PCR: NEGATIVE

## 2018-07-05 LAB — HIV ANTIBODY (ROUTINE TESTING W REFLEX): HIV Screen 4th Generation wRfx: NONREACTIVE

## 2018-07-05 MED ORDER — OXYCODONE HCL 5 MG PO TABS
5.0000 mg | ORAL_TABLET | ORAL | Status: DC | PRN
  Administered 2018-07-05 – 2018-07-06 (×6): 5 mg via ORAL
  Filled 2018-07-05 (×6): qty 1

## 2018-07-05 MED ORDER — NICOTINE 21 MG/24HR TD PT24
21.0000 mg | MEDICATED_PATCH | Freq: Every day | TRANSDERMAL | Status: DC
  Administered 2018-07-05 – 2018-07-16 (×12): 21 mg via TRANSDERMAL
  Filled 2018-07-05 (×12): qty 1

## 2018-07-05 MED ORDER — ALLOPURINOL 100 MG PO TABS
200.0000 mg | ORAL_TABLET | Freq: Every morning | ORAL | Status: DC
  Administered 2018-07-05 – 2018-07-08 (×4): 200 mg via ORAL
  Administered 2018-07-09: 100 mg via ORAL
  Administered 2018-07-10 – 2018-07-16 (×7): 200 mg via ORAL
  Filled 2018-07-05 (×12): qty 2

## 2018-07-05 MED ORDER — LISINOPRIL 20 MG PO TABS
20.0000 mg | ORAL_TABLET | Freq: Every day | ORAL | Status: DC
  Administered 2018-07-05 – 2018-07-16 (×11): 20 mg via ORAL
  Filled 2018-07-05 (×12): qty 1

## 2018-07-05 MED ORDER — DARIFENACIN HYDROBROMIDE ER 15 MG PO TB24: 15.0000 mg | ORAL_TABLET | Freq: Every day | ORAL | Status: DC

## 2018-07-05 MED ORDER — ASPIRIN EC 81 MG PO TBEC
81.0000 mg | DELAYED_RELEASE_TABLET | Freq: Every day | ORAL | Status: DC
  Administered 2018-07-05 – 2018-07-16 (×12): 81 mg via ORAL
  Filled 2018-07-05 (×12): qty 1

## 2018-07-05 MED ORDER — ONDANSETRON HCL 4 MG/2ML IJ SOLN: 4.0000 mg | Freq: Four times a day (QID) | INTRAMUSCULAR | Status: DC | PRN

## 2018-07-05 MED ORDER — ACETAMINOPHEN 325 MG PO TABS
650.0000 mg | ORAL_TABLET | ORAL | Status: DC | PRN
  Administered 2018-07-10 – 2018-07-11 (×3): 650 mg via ORAL
  Filled 2018-07-05 (×4): qty 2

## 2018-07-05 MED ORDER — ROSUVASTATIN CALCIUM 5 MG PO TABS
10.0000 mg | ORAL_TABLET | Freq: Every day | ORAL | Status: DC
  Administered 2018-07-05 – 2018-07-15 (×10): 10 mg via ORAL
  Filled 2018-07-05 (×11): qty 2

## 2018-07-05 MED ORDER — ROSUVASTATIN CALCIUM 10 MG PO TABS
10.0000 mg | ORAL_TABLET | Freq: Every day | ORAL | Status: DC
  Filled 2018-07-05 (×2): qty 1

## 2018-07-05 MED ORDER — SODIUM CHLORIDE 0.9 % IV SOLN
INTRAVENOUS | Status: DC
  Administered 2018-07-05: 22:00:00 via INTRAVENOUS

## 2018-07-05 MED ORDER — FENOFIBRATE 160 MG PO TABS
160.0000 mg | ORAL_TABLET | Freq: Every day | ORAL | Status: DC
  Administered 2018-07-05 – 2018-07-16 (×12): 160 mg via ORAL
  Filled 2018-07-05 (×12): qty 1

## 2018-07-05 MED ORDER — AMLODIPINE BESYLATE 10 MG PO TABS
10.0000 mg | ORAL_TABLET | Freq: Every morning | ORAL | Status: DC
  Administered 2018-07-05 – 2018-07-16 (×10): 10 mg via ORAL
  Filled 2018-07-05 (×8): qty 1
  Filled 2018-07-05: qty 2
  Filled 2018-07-05: qty 1

## 2018-07-05 MED ORDER — ENOXAPARIN SODIUM 40 MG/0.4ML ~~LOC~~ SOLN
40.0000 mg | SUBCUTANEOUS | Status: DC
  Administered 2018-07-05 – 2018-07-16 (×12): 40 mg via SUBCUTANEOUS
  Filled 2018-07-05 (×12): qty 0.4

## 2018-07-05 MED ORDER — SODIUM CHLORIDE 0.9 % IV BOLUS
1000.0000 mL | Freq: Once | INTRAVENOUS | Status: AC
  Administered 2018-07-05: 1000 mL via INTRAVENOUS

## 2018-07-05 NOTE — Progress Notes (Signed)
eLink Physician-Brief Progress Note Patient Name: Taylor Elliott DOB: February 22, 1951 MRN: [id number]   Date of Service  07/05/2018  HPI/Events of Note  Hyponatremia of unclear etiology. Pt recently started on Paxcil which rarely causes hyponatremia but he also has a history of renal and prostate cancer, and recently had viral type symptoms including diarrhea.  eICU Interventions  CT head result and images reviewed. Meningitis panel orders expanded.        Kerry Kass Ogan 07/05/2018, 12:07 AM

## 2018-07-05 NOTE — Progress Notes (Signed)
CRITICAL VALUE ALERT  Critical Value:  Sodium 119  Date & Time Notied:  07/05/2018  0555  Provider Notified: Warren Lacy

## 2018-07-05 NOTE — Progress Notes (Signed)
Called elink, informed nurse Gretchin pt potassium level 115, pt oriented to person place and time but forgetful.
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
## 2018-07-05 NOTE — Progress Notes (Signed)
eLink Physician-Brief Progress Note Patient Name: [name] DOB: [birth year] MRN: [id number]   Date of Service  07/05/2018  HPI/Events of Note  Na+ = 119 --> 115 --> 118 --> 115. Recent Hx of N/V/D and poor PO intake.   eICU Interventions  Will order: 1. 0.0 NaCl to run IV at 40 mL/hour.  2. Continue to trend Na+.      Intervention Category Major Interventions: Electrolyte abnormality - evaluation and management  Sommer,Steven Eugene 07/05/2018, 9:50 PM

## 2018-07-05 NOTE — Progress Notes (Signed)
   Subjective: This is a 67 y.o., male admitted on 07/04/2018 for hyponatremia history of nausea vomiting diarrhea and poor p.o. Intake.  Currently the patient states that he is feeling much better otherwise completely asymptomatic.  Feels at his baseline.  Does feel more hungry and willing to attempt p.o. intake.  Currently sitting up in bed eating a [employer] bagel.  History is reviewed, labs reviewed.  Objective: BP 137/74   Pulse 69   Temp (!) 97.5 F (36.4 C) (Oral)   Resp 13   Ht 5\' 8"  (1.727 m)   Wt 91.7 kg   SpO2 99%   BMI 30.74 kg/m    Intake/Output Summary (Last 24 hours) at 07/05/2018 0830 Last data filed at 07/05/2018 0500 Gross per 24 hour  Intake 1249.52 ml  Output 350 ml  Net 899.52 ml      Physical Examination: General appearance: 67 y.o., male, NAD, conversant  Eyes: anicteric sclerae, moist conjunctivae HENT: NCAT; oropharynx, MMM Neck: Trachea midline Lungs: CTAB, no crackles, no wheeze CV: RRR, S1, S2, no MRGs  Abdomen: Soft, non-tender; non-distended, BS present  Extremities: No peripheral edema, radial and DP pulses present bilaterally, left forefoot resection    Recent Labs  Lab 07/04/18 1646 07/05/18 0036 07/05/18 0510  NA 112* 116* 119*  K 4.1 3.8 4.0  CL 83* 82* 86*  CO2 21* 23 23  BUN 9 8 8   CREATININE 0.73 0.84 0.79  GLUCOSE 108* 112* 132*   Recent Labs  Lab 07/04/18 1134 07/04/18 1646 07/05/18 0030  WBC 5.3 5.2 4.1  HGB 13.5 12.9* 12.5*  HCT 38.1* 36.1* 36.1*  PLT 180.0 170 168   Recent Labs  Lab 07/04/18 1646  APTT 39*  INR 0.92   Chest Imaging:   CT chest from March 2019: Bilateral peripheral subpleural 2 to 3 mm lung nodules  Assessment:  Asymptomatic severe hyponatremia history of nausea vomiting and diarrhea, poor p.o. intake. History of renal cell carcinoma CAT scan in March with peripheral small 2 to 3 mm pulmonary nodules Current smoker, since college, greater than 30-pack-year history  Plan:  Encouraged  p.o. intake. No need for additional IV saline. We will continue to follow BMPs. Patient hemodynamically stable. And improving. Will recommend transfer from the intensive care unit to the hospitalist service.  I have talked with Dr. Thereasa Solo who has agreed to accept the patient for 07/06/2018.  High risk lung nodules due to history of renal cell carcinoma as well as smoking history.  Has scheduled outpatient CT chest imaging follow-up with his urologist in January 2019.  Patient and wife was counseled on the fact that if the nodules were to increase in size would recommend follow-up with me in the pulmonary office.  Patient was counseled on smoking cessation greater than 10 minutes.  Patient to [facility] hospitalist service 07/06/2018.  Garner Nash, DO [place] [facility] Critical Care 07/05/2018 8:30 AM  Personal pager: [phone] If unanswered, please page CCM On-call: [phone]

## 2018-07-06 ENCOUNTER — Inpatient Hospital Stay (HOSPITAL_COMMUNITY): Payer: 59

## 2018-07-06 ENCOUNTER — Encounter (HOSPITAL_COMMUNITY): Payer: Self-pay

## 2018-07-06 ENCOUNTER — Telehealth: Payer: Self-pay | Admitting: Medical

## 2018-07-06 DIAGNOSIS — R4781 Slurred speech: Secondary | ICD-10-CM

## 2018-07-06 DIAGNOSIS — Z905 Acquired absence of kidney: Secondary | ICD-10-CM

## 2018-07-06 DIAGNOSIS — R569 Unspecified convulsions: Secondary | ICD-10-CM

## 2018-07-06 DIAGNOSIS — G9341 Metabolic encephalopathy: Secondary | ICD-10-CM

## 2018-07-06 DIAGNOSIS — E785 Hyperlipidemia, unspecified: Secondary | ICD-10-CM

## 2018-07-06 LAB — BASIC METABOLIC PANEL
Anion gap: 10 (ref 5–15)
Anion gap: 10 (ref 5–15)
Anion gap: 10 (ref 5–15)
Anion gap: 12 (ref 5–15)
Anion gap: 4 — ABNORMAL LOW (ref 5–15)
Anion gap: 4 — ABNORMAL LOW (ref 5–15)
Anion gap: 5 (ref 5–15)
Anion gap: 6 (ref 5–15)
Anion gap: 7 (ref 5–15)
Anion gap: 7 (ref 5–15)
BUN: 11 mg/dL (ref 8–23)
BUN: 11 mg/dL (ref 8–23)
BUN: 6 mg/dL — ABNORMAL LOW (ref 8–23)
BUN: 6 mg/dL — ABNORMAL LOW (ref 8–23)
BUN: 6 mg/dL — ABNORMAL LOW (ref 8–23)
BUN: 6 mg/dL — ABNORMAL LOW (ref 8–23)
BUN: 7 mg/dL — ABNORMAL LOW (ref 8–23)
BUN: 7 mg/dL — ABNORMAL LOW (ref 8–23)
BUN: 8 mg/dL (ref 8–23)
BUN: 8 mg/dL (ref 8–23)
CO2: 20 mmol/L — ABNORMAL LOW (ref 22–32)
CO2: 21 mmol/L — ABNORMAL LOW (ref 22–32)
CO2: 21 mmol/L — ABNORMAL LOW (ref 22–32)
CO2: 21 mmol/L — ABNORMAL LOW (ref 22–32)
CO2: 22 mmol/L (ref 22–32)
CO2: 22 mmol/L (ref 22–32)
CO2: 22 mmol/L (ref 22–32)
CO2: 22 mmol/L (ref 22–32)
CO2: 23 mmol/L (ref 22–32)
CO2: 23 mmol/L (ref 22–32)
Calcium: 8.1 mg/dL — ABNORMAL LOW (ref 8.9–10.3)
Calcium: 8.1 mg/dL — ABNORMAL LOW (ref 8.9–10.3)
Calcium: 8.1 mg/dL — ABNORMAL LOW (ref 8.9–10.3)
Calcium: 8.2 mg/dL — ABNORMAL LOW (ref 8.9–10.3)
Calcium: 8.2 mg/dL — ABNORMAL LOW (ref 8.9–10.3)
Calcium: 8.3 mg/dL — ABNORMAL LOW (ref 8.9–10.3)
Calcium: 8.4 mg/dL — ABNORMAL LOW (ref 8.9–10.3)
Calcium: 8.4 mg/dL — ABNORMAL LOW (ref 8.9–10.3)
Calcium: 8.5 mg/dL — ABNORMAL LOW (ref 8.9–10.3)
Calcium: 8.8 mg/dL — ABNORMAL LOW (ref 8.9–10.3)
Chloride: 84 mmol/L — ABNORMAL LOW (ref 98–111)
Chloride: 84 mmol/L — ABNORMAL LOW (ref 98–111)
Chloride: 86 mmol/L — ABNORMAL LOW (ref 98–111)
Chloride: 87 mmol/L — ABNORMAL LOW (ref 98–111)
Chloride: 89 mmol/L — ABNORMAL LOW (ref 98–111)
Chloride: 90 mmol/L — ABNORMAL LOW (ref 98–111)
Chloride: 90 mmol/L — ABNORMAL LOW (ref 98–111)
Chloride: 92 mmol/L — ABNORMAL LOW (ref 98–111)
Chloride: 92 mmol/L — ABNORMAL LOW (ref 98–111)
Chloride: 93 mmol/L — ABNORMAL LOW (ref 98–111)
Creatinine, Ser: 0.56 mg/dL — ABNORMAL LOW (ref 0.61–1.24)
Creatinine, Ser: 0.62 mg/dL (ref 0.61–1.24)
Creatinine, Ser: 0.63 mg/dL (ref 0.61–1.24)
Creatinine, Ser: 0.65 mg/dL (ref 0.61–1.24)
Creatinine, Ser: 0.66 mg/dL (ref 0.61–1.24)
Creatinine, Ser: 0.68 mg/dL (ref 0.61–1.24)
Creatinine, Ser: 0.68 mg/dL (ref 0.61–1.24)
Creatinine, Ser: 0.78 mg/dL (ref 0.61–1.24)
Creatinine, Ser: 0.78 mg/dL (ref 0.61–1.24)
Creatinine, Ser: 0.79 mg/dL (ref 0.61–1.24)
GFR calc Af Amer: >60 mL/min (ref >60)
GFR calc Af Amer: >60 mL/min (ref >60)
GFR calc Af Amer: >60 mL/min (ref >60)
GFR calc Af Amer: >60 mL/min (ref >60)
GFR calc Af Amer: >60 mL/min (ref >60)
GFR calc Af Amer: >60 mL/min (ref >60)
GFR calc Af Amer: >60 mL/min (ref >60)
GFR calc Af Amer: >60 mL/min (ref >60)
GFR calc Af Amer: >60 mL/min (ref >60)
GFR calc Af Amer: >60 mL/min (ref >60)
GFR calc non Af Amer: >60 mL/min (ref >60)
GFR calc non Af Amer: >60 mL/min (ref >60)
GFR calc non Af Amer: >60 mL/min (ref >60)
GFR calc non Af Amer: >60 mL/min (ref >60)
GFR calc non Af Amer: >60 mL/min (ref >60)
GFR calc non Af Amer: >60 mL/min (ref >60)
GFR calc non Af Amer: >60 mL/min (ref >60)
GFR calc non Af Amer: >60 mL/min (ref >60)
GFR calc non Af Amer: >60 mL/min (ref >60)
GFR calc non Af Amer: >60 mL/min (ref >60)
Glucose, Bld: 114 mg/dL — ABNORMAL HIGH (ref 70–99)
Glucose, Bld: 115 mg/dL — ABNORMAL HIGH (ref 70–99)
Glucose, Bld: 117 mg/dL — ABNORMAL HIGH (ref 70–99)
Glucose, Bld: 119 mg/dL — ABNORMAL HIGH (ref 70–99)
Glucose, Bld: 125 mg/dL — ABNORMAL HIGH (ref 70–99)
Glucose, Bld: 127 mg/dL — ABNORMAL HIGH (ref 70–99)
Glucose, Bld: 163 mg/dL — ABNORMAL HIGH (ref 70–99)
Glucose, Bld: 179 mg/dL — ABNORMAL HIGH (ref 70–99)
Glucose, Bld: 93 mg/dL (ref 70–99)
Glucose, Bld: 99 mg/dL (ref 70–99)
Potassium: 3.5 mmol/L (ref 3.5–5.1)
Potassium: 4 mmol/L (ref 3.5–5.1)
Potassium: 4 mmol/L (ref 3.5–5.1)
Potassium: 4 mmol/L (ref 3.5–5.1)
Potassium: 4 mmol/L (ref 3.5–5.1)
Potassium: 4 mmol/L (ref 3.5–5.1)
Potassium: 4.1 mmol/L (ref 3.5–5.1)
Potassium: 4.1 mmol/L (ref 3.5–5.1)
Potassium: 4.1 mmol/L (ref 3.5–5.1)
Potassium: 4.7 mmol/L (ref 3.5–5.1)
Sodium: 114 mmol/L — CL (ref 135–145)
Sodium: 115 mmol/L — CL (ref 135–145)
Sodium: 115 mmol/L — CL (ref 135–145)
Sodium: 116 mmol/L — CL (ref 135–145)
Sodium: 116 mmol/L — CL (ref 135–145)
Sodium: 117 mmol/L — CL (ref 135–145)
Sodium: 117 mmol/L — CL (ref 135–145)
Sodium: 122 mmol/L — ABNORMAL LOW (ref 135–145)
Sodium: 123 mmol/L — ABNORMAL LOW (ref 135–145)
Sodium: 124 mmol/L — ABNORMAL LOW (ref 135–145)

## 2018-07-06 LAB — CBC WITH DIFFERENTIAL/PLATELET
Abs Immature Granulocytes: 0.02 10*3/uL (ref 0.00–0.07)
Basophils Absolute: 0 10*3/uL (ref 0.0–0.1)
Basophils Relative: 1 %
Eosinophils Absolute: 0.1 10*3/uL (ref 0.0–0.5)
Eosinophils Relative: 2 %
HCT: 35.6 % — ABNORMAL LOW (ref 39.0–52.0)
Hemoglobin: 12.3 g/dL — ABNORMAL LOW (ref 13.0–17.0)
Immature Granulocytes: 1 %
Lymphocytes Relative: 8 %
Lymphs Abs: 0.4 10*3/uL — ABNORMAL LOW (ref 0.7–4.0)
MCH: 29.4 pg (ref 26.0–34.0)
MCHC: 34.6 g/dL (ref 30.0–36.0)
MCV: 85.2 fL (ref 80.0–100.0)
Monocytes Absolute: 0.4 10*3/uL (ref 0.1–1.0)
Monocytes Relative: 11 %
Neutro Abs: 3.2 10*3/uL (ref 1.7–7.7)
Neutrophils Relative %: 77 %
Platelets: 171 10*3/uL (ref 150–400)
RBC: 4.18 MIL/uL — ABNORMAL LOW (ref 4.22–5.81)
RDW: 13.1 % (ref 11.5–15.5)
WBC: 4.2 10*3/uL (ref 4.0–10.5)
nRBC: 0 % (ref 0.0–0.2)

## 2018-07-06 LAB — GLUCOSE, CAPILLARY: Glucose-Capillary: 121 mg/dL — ABNORMAL HIGH (ref 70–99)

## 2018-07-06 LAB — CORTISOL-AM, BLOOD: Cortisol - AM: 8.9 ug/dL (ref 6.7–22.6)

## 2018-07-06 MED ORDER — LORAZEPAM 2 MG/ML IJ SOLN
INTRAMUSCULAR | Status: AC
  Filled 2018-07-06: qty 1

## 2018-07-06 MED ORDER — GADOBUTROL 1 MMOL/ML IV SOLN
9.0000 mL | Freq: Once | INTRAVENOUS | Status: AC | PRN
  Administered 2018-07-06: 9 mL via INTRAVENOUS

## 2018-07-06 MED ORDER — SODIUM CHLORIDE 3 % IV SOLN
INTRAVENOUS | Status: DC
  Filled 2018-07-06 (×4): qty 500

## 2018-07-06 MED ORDER — SODIUM CHLORIDE 3 % IV SOLN
INTRAVENOUS | Status: DC
  Filled 2018-07-06: qty 500

## 2018-07-06 MED ORDER — LORAZEPAM 2 MG/ML IJ SOLN
INTRAMUSCULAR | Status: AC
  Administered 2018-07-06: 1 mg
  Filled 2018-07-06: qty 1

## 2018-07-06 MED ORDER — SODIUM CHLORIDE 3 % IV SOLN
INTRAVENOUS | Status: DC
  Administered 2018-07-07: 40 mL/h via INTRAVENOUS
  Filled 2018-07-06 (×2): qty 500

## 2018-07-06 MED ORDER — LEVETIRACETAM IN NACL 1000 MG/100ML IV SOLN
1000.0000 mg | Freq: Once | INTRAVENOUS | Status: AC
  Administered 2018-07-06: 1000 mg via INTRAVENOUS
  Filled 2018-07-06: qty 100

## 2018-07-06 MED ORDER — LEVETIRACETAM IN NACL 500 MG/100ML IV SOLN
500.0000 mg | Freq: Two times a day (BID) | INTRAVENOUS | Status: DC
  Administered 2018-07-06 – 2018-07-07 (×3): 500 mg via INTRAVENOUS
  Filled 2018-07-06 (×4): qty 100

## 2018-07-06 MED ORDER — HYDROCORTISONE 5 MG PO TABS
5.0000 mg | ORAL_TABLET | Freq: Every day | ORAL | Status: DC
  Administered 2018-07-06 – 2018-07-09 (×4): 5 mg via ORAL
  Filled 2018-07-06 (×4): qty 1

## 2018-07-06 MED ORDER — SODIUM CHLORIDE 3 % IV SOLN
INTRAVENOUS | Status: DC
  Administered 2018-07-06: 30 mL/h via INTRAVENOUS
  Filled 2018-07-06: qty 500

## 2018-07-06 NOTE — Progress Notes (Signed)
eLink Physician-Brief Progress Note Patient Name: Taylor Elliott DOB: 03/25/1951 MRN: [id number]   Date of Service  07/06/2018  HPI/Events of Note  Bedside nurse calls to report a new onset of garbled speech and confusion. Neuro exam from eICU is non focal. He has had Oxycodone X 2 today which is more than he takes at home. Wife is sure that this is a change for him.   eICU Interventions  Will order:  1. Head CT Scan without contrast STAT.  2. I have spoke to the Neurologist on call who will see the patient in consultation.         Sommer,Steven Cornelia Copa 07/06/2018, 2:43 AM

## 2018-07-06 NOTE — Progress Notes (Signed)
eLink Physician-Brief Progress Note Patient Name: Taylor Elliott DOB: May 09, 1951 MRN: [id number]   Date of Service  07/06/2018  HPI/Events of Note  Bedside nurse describes seizure like activity. Patient transiently bagged.   eICU Interventions  Will order: 1. Keppra loading and maintenance dose. 2. Will ask ground team to assess patient at bedside.         Ragena Fiola Cornelia Copa 07/06/2018, 5:40 AM

## 2018-07-06 NOTE — Progress Notes (Signed)
NAME:  Taylor Elliott, MRN:  [id number], DOB:  May 16, 1951, LOS: 2 ADMISSION DATE:  07/04/2018, CONSULTATION DATE:  07/05/2018 REFERRING MD:  CHIEF COMPLAINT:  Hyponatremia  Brief History / History of Present Illness   Mr. Prajapati is a 67 YO gentleman who has been transferred to ICU from [place] ED for higher level management of hyponatremia.  Patient states that he was in his USOH until about a week ago. He stated that he was in [place] for thanksgiving and that night when he got back home he developed acute onset vomiting and diarrhea. He states that he had multiple bouts of vomiting and diarrhea that lasted for about 2 days. He took imodium and his symptoms resolved. During the week he developed congestion and he obtained over the counter decongestants with some improvement. He denies fever or chills. He however states that he had had no appetite and therefore has not been eating. He states that he has been drinking gatorade and some water. He endorses occasional cough productive of clear sputum. He denies hemoptysis. He denies chest pain. He denies any sick contacts. He states that last night diarrhea recurred without vomiting. He had 2 bouts then took imodium. He thus decided to contact his PCP. He was seen by his PCP and had CXR and abdominal Xray that were reported as normal. He had lab works done that revealed hyponatremia with sodium of 113. He was advised to present emergently to the ER. He denies any seizures or dizziness. He states that he has had gait disturbances and imbalance for several years due to his peripheral neuropathy. He states that his imbalance has been progressively worsening over the past three months.   At the OSH ED he was found to have a sodium of 112., unremarkable CBC. He had a CT head done that showed no acute intracranial abnormality An LP was performed that showed elevated protein with normal cell count.    Past Medical History  -Right renal carcinoma status post  nephrectomy -Essential hypertension -Hyperlipidemia -Prostate cancer status post prostatectomy now with urinary incontinence -peripheral neuropathy of bilateral lower extremities -Status post Left trans metatarsal amputation  Significant Hospital Events   ICU admission 07/05/2018 12/8 - Seizure like episode, CT head negative, stat MRI/EEG - pending   Consults:  Critical care  Procedures:  Peripheral IVs  12/8 CT Head - Negative  12/8 MRI Brain - pending  12/8 EEG - pending   Significant Diagnostic Tests:  Serum sodium 112  Micro Data:  NGTD   Antimicrobials:  None   Subjective:  Overnight had concern for strokelike symptoms with difficulty speech.  Also had seizure-like activity greatest.  Was 30 seconds.  Was seen by neurology last night.  Additionally patient was placed on hypertonic saline as his sodium continued to fall.  Currently 116 this morning.  He was also given loading dose of Keppra.  Patient is currently going down for MRI of the brain which is pending.  As well as EEG which is pending.  Updated and discussed overnight events with wife at bedside.  Objective   Blood pressure 121/62, pulse 79, temperature 98 F (36.7 C), temperature source Oral, resp. rate 16, height 5\' 8"  (1.727 m), weight 94.9 kg, SpO2 100 %.        Intake/Output Summary (Last 24 hours) at 07/06/2018 0819 Last data filed at 07/06/2018 0500 Gross per 24 hour  Intake 1576.11 ml  Output 825 ml  Net 751.11 ml   [employer]  07/04/18 1610 07/04/18 2346 07/06/18 0355  Weight: 97.5 kg 91.7 kg 94.9 kg    Examination: General appearance: 67 y.o., male, NAD, conversant  Eyes: anicteric sclerae, PERRLA, tracking appropriately, intact extraocular motion, no nystagmus HENT: NCAT; oropharynx, MMM Neck: Trachea midline; FROM, supple, lymphadenopathy, no JVD Lungs: CTAB, no crackles, no wheeze, with normal respiratory effort and no intercostal retractions CV: RRR, S1, S2, no MRGs  Abdomen:  Soft, non-tender; non-distended, BS present  Extremities: No peripheral edema, radial and DP pulses present bilaterally, left forefoot resection Skin: Normal temperature, turgor and texture; no rash Psych: Appropriate affect Neuro: Alert and oriented to person and place, no focal deficit, normal 5 out of 5 upper extremity and lower extremity strength.  Normal bilateral hand grip strength    Assessment & Plan:   Hyponatremia, with seizure/strokelike symptoms, possible CVA (MRI pending)  Initially presented with asymptomatic hyponatremia.  Was given a liter of saline with response up from under 112 to 119.  Urine sodium 57. Elevated urine Osm suggest component of SIADH. Serum cortisol was slightly decreased below AM limits. Overall seems mixed issues.  07/06/2018 with seizure/strokelike symptoms.  Stat CT of the head was negative MRI brain stat, pending results EEG pending Neurology has seen and consulted.  We appreciate their recommendations. Patient was given Keppra. Started on hypertonic saline currently at 30 cc an hour. Will given oral hydrocortisone  Continue frequent neuro checks Every 2 hour BMP Stat repeat this AM   Hypertension -current home blood pressure regimen lisinopril amlodipine  Depression/Anxiety -Hold SSRI as possible etiologies of hyponatremia  Hyperlipidemia -Continue PTA statin  Gait disturbance Imbalance Peripheral neuropathy -We will likely need PT OT evaluation prior to discharge  Tobacco abuse Again on smoking cessation. Given nicotine patch   Best practice:  Diet: Regular diet Pain/Anxiety/Delirium protocol (if indicated): Not indicated VAP protocol (if indicated):Not indicated  DVT prophylaxis: Enoxaparin GI prophylaxis: Not indicated  Glucose control: Maintain euglycemia Mobility: PT and OT to evaluate and treat Code Status: Discussed code status with the patient and he is full code Family Communication: Discussed current clinical status  with patient and his wife at the bedside  Disposition: Admit to ICU  Labs   CBC: Recent Labs  Lab 07/04/18 1134 07/04/18 1646 07/05/18 0030 07/06/18 0431  WBC 5.3 5.2 4.1 4.2  NEUTROABS 4.4 4.2 3.1 3.2  HGB 13.5 12.9* 12.5* 12.3*  HCT 38.1* 36.1* 36.1* 35.6*  MCV 87.6 84.7 85.1 85.2  PLT 180.0 170 168 176    Basic Metabolic Panel: Recent Labs  Lab 07/04/18 1646  07/05/18 1158 07/05/18 1628 07/05/18 2003 07/06/18 0047 07/06/18 0431  NA 112*   < > 115* 118* 115* 116* 116*  K 4.1   < > 3.7 4.1 4.2 4.0 4.1  CL 83*   < > 82* 86* 82* 84* 84*  CO2 21*   < > 21* 23 23 22  20*  GLUCOSE 108*   < > 79 112* 138* 127* 114*  BUN 9   < > 8 15 15 11 11   CREATININE 0.73   < > 0.69 1.00 0.88 0.79 0.78  CALCIUM 8.6*   < > 8.4* 8.6* 8.4* 8.5* 8.8*  MG 1.7  --   --   --   --   --   --    < > = values in this interval not displayed.   GFR: Estimated Creatinine Clearance: 101.5 mL/min (by C-G formula based on SCr of 0.78 mg/dL). Recent Labs  Lab 07/04/18  1134 07/04/18 1646 07/05/18 0030 07/06/18 0431  WBC 5.3 5.2 4.1 4.2    Liver Function Tests: Recent Labs  Lab 07/04/18 1134  AST 22  ALT 27  ALKPHOS 108  BILITOT 0.5  PROT 6.4  ALBUMIN 4.1   Recent Labs  Lab 07/04/18 1134  LIPASE 26.0  AMYLASE 49   No results for input(s): AMMONIA in the last 168 hours.  ABG No results found for: PHART, PCO2ART, PO2ART, HCO3, TCO2, ACIDBASEDEF, O2SAT   Coagulation Profile: Recent Labs  Lab 07/04/18 1646  INR 0.92    Cardiac Enzymes: No results for input(s): CKTOTAL, CKMB, CKMBINDEX, TROPONINI in the last 168 hours.  HbA1C: No results found for: HGBA1C  CBG: Recent Labs  Lab 07/04/18 2051 07/06/18 0235  GLUCAP 96 121*    This patient is critically ill with multiple organ system failure; which, requires frequent high complexity decision making, assessment, support, evaluation, and titration of therapies. This was completed through the application of advanced  monitoring technologies and extensive interpretation of multiple databases. During this encounter critical care time was devoted to patient care services described in this note for 42 minutes.   Garner Nash, DO [facility] 07/06/2018 8:20 AM  Personal pager: [phone] If unanswered, please page CCM On-call: [phone]

## 2018-07-06 NOTE — Procedures (Signed)
Central Venous Catheter Insertion Procedure Note HUZAIFA VINEY [id number] 18-Nov-1950  Procedure: Insertion of Central Venous Catheter Indications: Assessment of intravascular volume, Drug and/or fluid administration and Frequent blood sampling  Procedure Details Consent: Risks of procedure as well as the alternatives and risks of each were explained to the (patient/caregiver).  Consent for procedure obtained. Time Out: Verified patient identification, verified procedure, site/side was marked, verified correct patient position, special equipment/implants available, medications/allergies/relevent history reviewed, required imaging and test results available.  Performed  Maximum sterile technique was used including antiseptics, cap, gloves, gown, hand hygiene, mask and sheet. Skin prep: Chlorhexidine; local anesthetic administered A antimicrobial bonded/coated triple lumen catheter was placed in the right external jugular vein using the Seldinger technique.  Evaluation Blood flow good Complications: No apparent complications Patient did tolerate procedure well. Chest X-ray ordered to verify placement.  CXR: pending.  Hayden Pedro, AGACNP-BC [facility]  Pgr: [phone]  PCCM Pgr: [phone]

## 2018-07-06 NOTE — Progress Notes (Signed)
Pt wife approached nurse station with urgency stating "he is having a seizure". This rn charge Koppel and pt wife immediately to bedside to witness facial jerking and twitching  and a compromised airway with progression to facial cyanosis. Simultaneously placed supine, 100% bagging initiated, elink notified and RT paged. Approximate duration of 2 minutes with pt immediately being alert and oriented x4 speaking following commands and asking "what happened". Pt placed on 2 liters [place]. Will continue to monitor pt and await CCM to bedside.

## 2018-07-06 NOTE — Progress Notes (Signed)
Brief same-day note  Patient seen and examined Awake alert oriented x3. Mild dysarthria. Mildly reduced attention concentration. Pupils equal round reactive light, extra ocular movements intact, visual fields full, mild left lower facial weakness. Symmetric strength all over with no drift Intact sensation  MRI brain completed.  No acute abnormality.  Left frontal gyriform abnormality- arachnoid cyst/congenital anomaly.  Impression Provoked seizure in the setting of hyponatremia in a patient who has a structural lesion in the brain, which otherwise would have been unremarkable but probably contributed to large seizure threshold from hyponatremia.  Recommendations Correction of hyponatremia-no more than 10 points per day. Currently receiving 3% sodium chloride at 30 cc an hour.  I would raise that to 70 cc an hour. If the sodium is not uptrending by 6 PM, I would recommend placing a central line and using 23.4% sodium chloride. Appreciate PCCM assistance. EEG being done at this time, results pending. Patient will continue to be on Keppra because of the seizure and structural abnormality in the brain MRI. I discussed with him in detail the [place] state law providing driving unless 6 months seizure-free.  He was not very happy to hear the restrictions but his family and wife have verbalized complete understanding and said that he will comply with it.  I spoke with the family in detail.  Answer all the questions to the best of my ability. We will continue to follow with you.  -- Amie Portland, MD [facility] Pager: [phone] If 7pm to 7am, please call on call as listed on AMION.

## 2018-07-06 NOTE — Progress Notes (Signed)
Ct scan complete and pt awaiting return to room. Pt t tearful, pt apologizing for current circumstances and events that have occurred. Encouragement, positive reinforcement and emotional support given to pt.

## 2018-07-06 NOTE — Plan of Care (Signed)
PCCM Plan of Care Note Evaluated [name] at bedside for reported seizure like activity  He is a 67 YO gentleman who was admitted on 12/7 for management of asymptomatic hyponatremia  Earlier this morning patient had an episode of difficulty with word finding and slurring of speech. Neurology was consulted and stat CT head was obtained that was unremarkable. Bedside nurse reports that patient had an episode where he had facial twitching with cyanosis requiring bag mask ventilation. He did not have generalized tonic clonic movements. No bowel or urine incontinence noted. No eye rolling or foaming from the mouth. No tongue biting. Episode spontaneously resolved after 15 secs. No post ictal period.  Since I have been at the bedside patient has had multiple brief episodes of facial twitching (approx 5 secs long). Longest episode that I witnessed was about 30secs with head jerking and left facial and upper extremity jerking. No post ictal period. Given 1mg  of ativan during this episode. Patient subsequently had left facial droop that appears new to me.  O/E Awake, oriented to time person and place, anxious Has left side facial droop No extremity weakness  Plan  -PRN ativan for seizure activity -Will start hypertonic saline 30cc/hour for 6 hours -Trend sodium Q4 -Will obtain stat brain MRI to evaluate for stroke -Called neurology and requested for EEG   Critical Care Time devoted to patient care services described in this note is  50 Minutes. This time reflects time of care of this signee Dr. Oswald Hillock. This critical care time does not reflect procedure time, or teaching time or supervisory time of PA/NP/Med student/Med Resident etc but could involve care discussion time.  Oswald Hillock, M.D. [facility]. Pager:  After hours pager: [phone].

## 2018-07-06 NOTE — Progress Notes (Signed)
Dr Aroor at bedside to re-eval pt. Verbal delay and deficit observed, pt and wife enure md of frequent verbal clarity since return from ct scan. Md explain will continue to re-eval to determine need for MRI.

## 2018-07-06 NOTE — Telephone Encounter (Signed)
I am reviewing pt recent hospitalization notes. I saw patient and then we advised ED evaluation. It would be best when he is discharged to be seen by his pcp if possible. Please make atempt for him to follow up with pcp.

## 2018-07-06 NOTE — Progress Notes (Signed)
Pt wife verbalized to this rn she noted three episodes of where pt was having a "seizure" due to his brief delayed responses and "squinting twitching eyes". Pt wife expressed initial alarm and comments she gave thought to call for help but during each episode, symptoms quickly resolved and pt would explain that he was "concentrating and in deep thought". Pt wife encouraged and instructed to notify nursing staff for any further events.
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
## 2018-07-06 NOTE — Progress Notes (Addendum)
Pt wife approached nurse station with urgency to inform this rn pt reports change in speech and "not feeling well". This rn and pt wife to bedside to find pt fully alert with speech deficit no facial droop and anxious. CBG 121 and vital signs stablePt denies acute pain nausea or dizziness. Elink notified and camera visual done by Dr Oletta Darter. Will transport pt on cardiac monitor for stat ct head. Pt commenting "I'm scared", emotional support given to pt.

## 2018-07-06 NOTE — Progress Notes (Signed)
EEG complete - results pending 

## 2018-07-06 NOTE — Progress Notes (Addendum)
EEG reviewed. Triphasics indicating diffuse encephlopathy. CSF with increased protein, likely reactive from seizure. No MRI evidence of stroke or temporal lobe enhancement to suggest HSV encephalitis. Await HSV PCR   Recs: C/W Keppra Correction of hyponatremia per PCCM. We will continue to follow with you.  -- Amie Portland, MD [facility] Pager: [phone] If 7pm to 7am, please call on call as listed on AMION.

## 2018-07-06 NOTE — Progress Notes (Signed)
ELECTROENCEPHALOGRAM REPORT Date of Study: 07/06/18 MRN: [id number]   Clinical History: 67 YO M with PMH significant for R RCC S/P nephrotomy, HTN who had been having couple episodes of voimiting and diarrhea and poor PO intake  before admission, on admission labs showed dehydration with NA 112, Pt had provoked seizure in the setting of hyponatremia. Pt was started on Keppra    Technical Summary:  A multichannel digital EEG recording measured by the international 10-20 system with electrodes applied with paste and impedances below 5000 ohms performed in our laboratory with EKG monitoring in an awake and asleep patient. The digital EEG was referentially recorded, reformatted, and digitally filtered in a variety of bipolar and referential montages for optimal display.  Description:  The patient is awake and asleep during the recording. During maximal wakefulness, there is a symmetric, medium voltage 10 Hz posterior dominant rhythm that attenuates with eye opening. The record is symmetric. During drowsiness and sleep, there is an increase in theta slowing of the background. Vertex waves and symmetric sleep spindles were seen. Throughout the recording, triphasic waves were noted , There were no electrographic seizures seen.  EKG lead was unremarkable.  Impression:  This awake and asleep EEG is abnormal due to triphasic waves,  There were no electrographic seizures seen.

## 2018-07-06 NOTE — Progress Notes (Signed)
eLink Physician-Brief Progress Note Patient Name: BENITO LEMMERMAN DOB: 03-05-1951 MRN: [id number]   Date of Service  07/06/2018  HPI/Events of Note  Na+ = 117 --> 122.   eICU Interventions  Will order: 1. Decrease 3% NaCl IV infusion from 75 mL/hour to 40 mL/hour.      Intervention Category Major Interventions: Electrolyte abnormality - evaluation and management  Sommer,Steven Eugene 07/06/2018, 9:09 PM

## 2018-07-06 NOTE — Progress Notes (Addendum)
CCM to bedside for eval to find pt alert and oriented and speaking with clarity. After md returned to nurse station, pt wife urgently verbalized for help. This rn immediately to bedside to witness 3 second seizure like activity with eyes closed and facial twitching/jerking. Pt alert with left side facial droop upon md arrival to bedside but soon developed recurrent approximate 2 minute seizure like activity with eyes closed head and eyes deviated to the left and facial twitching/jerking. Ativan 1mg  ivp given per md order

## 2018-07-07 ENCOUNTER — Telehealth (INDEPENDENT_AMBULATORY_CARE_PROVIDER_SITE_OTHER): Payer: Self-pay

## 2018-07-07 LAB — BASIC METABOLIC PANEL
Anion gap: 7 (ref 5–15)
Anion gap: 8 (ref 5–15)
Anion gap: 7 (ref 5–15)
Anion gap: 10 (ref 5–15)
Anion gap: 10 (ref 5–15)
BUN: 6 mg/dL — ABNORMAL LOW (ref 8–23)
BUN: 7 mg/dL — ABNORMAL LOW (ref 8–23)
BUN: 6 mg/dL — ABNORMAL LOW (ref 8–23)
BUN: 5 mg/dL — ABNORMAL LOW (ref 8–23)
BUN: 6 mg/dL — ABNORMAL LOW (ref 8–23)
CO2: 25 mmol/L (ref 22–32)
CO2: 23 mmol/L (ref 22–32)
CO2: 25 mmol/L (ref 22–32)
CO2: 21 mmol/L — ABNORMAL LOW (ref 22–32)
CO2: 22 mmol/L (ref 22–32)
Calcium: 8.3 mg/dL — ABNORMAL LOW (ref 8.9–10.3)
Calcium: 8.3 mg/dL — ABNORMAL LOW (ref 8.9–10.3)
Calcium: 8.4 mg/dL — ABNORMAL LOW (ref 8.9–10.3)
Calcium: 8.3 mg/dL — ABNORMAL LOW (ref 8.9–10.3)
Calcium: 8.6 mg/dL — ABNORMAL LOW (ref 8.9–10.3)
Chloride: 93 mmol/L — ABNORMAL LOW (ref 98–111)
Chloride: 95 mmol/L — ABNORMAL LOW (ref 98–111)
Chloride: 95 mmol/L — ABNORMAL LOW (ref 98–111)
Chloride: 95 mmol/L — ABNORMAL LOW (ref 98–111)
Chloride: 92 mmol/L — ABNORMAL LOW (ref 98–111)
Creatinine, Ser: 0.74 mg/dL (ref 0.61–1.24)
Creatinine, Ser: 0.7 mg/dL (ref 0.61–1.24)
Creatinine, Ser: 0.73 mg/dL (ref 0.61–1.24)
Creatinine, Ser: 0.84 mg/dL (ref 0.61–1.24)
Creatinine, Ser: 0.82 mg/dL (ref 0.61–1.24)
GFR calc Af Amer: >60 mL/min (ref >60)
GFR calc Af Amer: >60 mL/min (ref >60)
GFR calc Af Amer: >60 mL/min (ref >60)
GFR calc Af Amer: >60 mL/min (ref >60)
GFR calc Af Amer: >60 mL/min (ref >60)
GFR calc non Af Amer: >60 mL/min (ref >60)
GFR calc non Af Amer: >60 mL/min (ref >60)
GFR calc non Af Amer: >60 mL/min (ref >60)
GFR calc non Af Amer: >60 mL/min (ref >60)
GFR calc non Af Amer: >60 mL/min (ref >60)
Glucose, Bld: 155 mg/dL — ABNORMAL HIGH (ref 70–99)
Glucose, Bld: 125 mg/dL — ABNORMAL HIGH (ref 70–99)
Glucose, Bld: 104 mg/dL — ABNORMAL HIGH (ref 70–99)
Glucose, Bld: 169 mg/dL — ABNORMAL HIGH (ref 70–99)
Glucose, Bld: 116 mg/dL — ABNORMAL HIGH (ref 70–99)
Potassium: 3.5 mmol/L (ref 3.5–5.1)
Potassium: 3.7 mmol/L (ref 3.5–5.1)
Potassium: 4 mmol/L (ref 3.5–5.1)
Potassium: 3.8 mmol/L (ref 3.5–5.1)
Potassium: 4.1 mmol/L (ref 3.5–5.1)
Sodium: 125 mmol/L — ABNORMAL LOW (ref 135–145)
Sodium: 126 mmol/L — ABNORMAL LOW (ref 135–145)
Sodium: 127 mmol/L — ABNORMAL LOW (ref 135–145)
Sodium: 126 mmol/L — ABNORMAL LOW (ref 135–145)
Sodium: 124 mmol/L — ABNORMAL LOW (ref 135–145)

## 2018-07-07 LAB — CBC WITH DIFFERENTIAL/PLATELET
Abs Immature Granulocytes: 0.01 10*3/uL (ref 0.00–0.07)
Basophils Absolute: 0 10*3/uL (ref 0.0–0.1)
Basophils Relative: 1 %
Eosinophils Absolute: 0.1 10*3/uL (ref 0.0–0.5)
Eosinophils Relative: 2 %
HCT: 33.2 % — ABNORMAL LOW (ref 39.0–52.0)
Hemoglobin: 11 g/dL — ABNORMAL LOW (ref 13.0–17.0)
Immature Granulocytes: 0 %
Lymphocytes Relative: 9 %
Lymphs Abs: 0.3 10*3/uL — ABNORMAL LOW (ref 0.7–4.0)
MCH: 29.2 pg (ref 26.0–34.0)
MCHC: 33.1 g/dL (ref 30.0–36.0)
MCV: 88.1 fL (ref 80.0–100.0)
Monocytes Absolute: 0.4 10*3/uL (ref 0.1–1.0)
Monocytes Relative: 10 %
Neutro Abs: 2.8 10*3/uL (ref 1.7–7.7)
Neutrophils Relative %: 78 %
Platelets: 151 10*3/uL (ref 150–400)
RBC: 3.77 MIL/uL — ABNORMAL LOW (ref 4.22–5.81)
RDW: 13.2 % (ref 11.5–15.5)
WBC: 3.6 10*3/uL — ABNORMAL LOW (ref 4.0–10.5)
nRBC: 0 % (ref 0.0–0.2)

## 2018-07-07 LAB — SODIUM
Sodium: 124 mmol/L — ABNORMAL LOW (ref 135–145)
Sodium: 126 mmol/L — ABNORMAL LOW (ref 135–145)

## 2018-07-07 MED ORDER — DEXTROSE 5 % IV BOLUS
500.0000 mL | Freq: Once | INTRAVENOUS | Status: AC
  Administered 2018-07-07: 11:00:00 via INTRAVENOUS

## 2018-07-07 MED ORDER — SODIUM CHLORIDE 3 % IV SOLN
INTRAVENOUS | Status: AC
  Filled 2018-07-07: qty 500

## 2018-07-07 MED ORDER — SODIUM CHLORIDE 3 % IV SOLN
INTRAVENOUS | Status: DC
  Administered 2018-07-07: 30 mL/h via INTRAVENOUS
  Administered 2018-07-08: 20 mL/h via INTRAVENOUS
  Administered 2018-07-08 – 2018-07-09 (×2): 50 mL/h via INTRAVENOUS
  Administered 2018-07-10: 30 mL/h via INTRAVENOUS
  Filled 2018-07-07 (×8): qty 500

## 2018-07-07 NOTE — Progress Notes (Signed)
NAME:  Taylor Elliott, MRN:  [id number], DOB:  Sep 23, 1950, LOS: 3 ADMISSION DATE:  07/04/2018, CONSULTATION DATE:  07/05/2018 REFERRING MD:  CHIEF COMPLAINT:  Hyponatremia  Brief History / History of Present Illness   Taylor Elliott is a 67 YO gentleman who has been transferred to ICU from [place] ED for higher level management of hyponatremia.  Patient states that he was in his USOH until about a week ago. He stated that he was in [place] for thanksgiving and that night when he got back home he developed acute onset vomiting and diarrhea. He states that he had multiple bouts of vomiting and diarrhea that lasted for about 2 days. He took imodium and his symptoms resolved. During the week he developed congestion and he obtained over the counter decongestants with some improvement. He denies fever or chills. He however states that he had had no appetite and therefore has not been eating. He states that he has been drinking gatorade and some water. He endorses occasional cough productive of clear sputum. He denies hemoptysis. He denies chest pain. He denies any sick contacts. He states that last night diarrhea recurred without vomiting. He had 2 bouts then took imodium. He thus decided to contact his PCP. He was seen by his PCP and had CXR and abdominal Xray that were reported as normal. He had lab works done that revealed hyponatremia with sodium of 113. He was advised to present emergently to the ER. He denies any seizures or dizziness. He states that he has had gait disturbances and imbalance for several years due to his peripheral neuropathy. He states that his imbalance has been progressively worsening over the past three months.   At the OSH ED he was found to have a sodium of 112., unremarkable CBC. He had a CT head done that showed no acute intracranial abnormality An LP was performed that showed elevated protein with normal cell count.    Past Medical History  -Right renal carcinoma status post  nephrectomy -Essential hypertension -Hyperlipidemia -Prostate cancer status post prostatectomy now with urinary incontinence -peripheral neuropathy of bilateral lower extremities -Status post Left trans metatarsal amputation  Significant Hospital Events   ICU admission 07/05/2018 12/8 - Seizure like episode, CT head negative, stat MRI/EEG - pending   Consults:  Critical care  Procedures:  Peripheral IVs  12/8 CT Head - Negative  12/8 MRI Brain - pending  12/8 EEG - pending   Significant Diagnostic Tests:  Serum sodium 112  Micro Data:  NGTD   Antimicrobials:  None   Subjective:  No further seizure activity reported Note trend of his sodium to 127, his 3% saline was discontinued and he received a water bolus.   Objective   Blood pressure 139/70, pulse 90, temperature 98 F (36.7 C), temperature source Oral, resp. rate 17, height 5\' 8"  (1.727 m), weight 94.9 kg, SpO2 100 %.        Intake/Output Summary (Last 24 hours) at 07/07/2018 1429 Last data filed at 07/07/2018 1200 Gross per 24 hour  Intake 1692.98 ml  Output 2375 ml  Net -682.02 ml   Filed Weights   07/04/18 1610 07/04/18 2346 07/06/18 0355  Weight: 97.5 kg 91.7 kg 94.9 kg    Examination: General appearance: Elderly gentleman, no distress in bed Eyes: Anicteric, pupils equal, normal tracking HENT: Oropharynx moist, no lesions Neck: No JVD Lungs: Clear bilaterally, no wheeze, no crackles CV: Regular, no murmur Abdomen: Soft, nondistended, positive bowel sounds Extremities: No edema, history  left metatarsal amputation Skin: No rash Psych: Appropriate affect Neuro: Awake, alert, interacting appropriately, he may have some very subtle word finding difficulty.  He perceives that he is not thinking at baseline.  No other focal deficits noted    Assessment & Plan:   Hyponatremia, with seizure/strokelike symptoms, no evidence CVA by MRI 12/8.  No active seizure activity by EEG 12/8 Initially presented  with asymptomatic hyponatremia.  Was given a liter of saline with response up from under 112 to 119.  Urine sodium 57. Elevated urine Osm suggest component of SIADH. Serum cortisol was slightly decreased below AM limits. Overall seems mixed issues.   3% saline currently on hold, following frequent sodium for trend, change to every 6 hours if next sodium value stable Appreciate neurology assistance, currently on Keppra CSF studies are still pending (HSV, crypto antigen), but little evidence for meningitis Continue oral hydrocortisone for another day to ensure stable trend of sodium off 3% saline infusion  Hypertension Continue lisinopril, amlodipine  Depression/Anxiety SSRI has been held, favor not restarting given potential contribution to his hyponatremia  Hyperlipidemia Continue statin as ordered  Gait disturbance Imbalance Peripheral neuropathy Initiate PT/OT  Tobacco abuse Needs smoking cessation Nicotine patch in place   Best practice:  Diet: Regular diet Pain/Anxiety/Delirium protocol (if indicated): Not indicated VAP protocol (if indicated):Not indicated  DVT prophylaxis: Enoxaparin GI prophylaxis: Not indicated  Glucose control: Maintain euglycemia Mobility: PT and OT to evaluate and treat Code Status: Discussed code status with the patient and he is full code Family Communication: Discussed with the patient and his wife at bedside 12/9 Disposition: Admit to ICU  Labs   CBC: Recent Labs  Lab 07/04/18 1134 07/04/18 1646 07/05/18 0030 07/06/18 0431 07/07/18 0435  WBC 5.3 5.2 4.1 4.2 3.6*  NEUTROABS 4.4 4.2 3.1 3.2 2.8  HGB 13.5 12.9* 12.5* 12.3* 11.0*  HCT 38.1* 36.1* 36.1* 35.6* 33.2*  MCV 87.6 84.7 85.1 85.2 88.1  PLT 180.0 170 168 171 376    Basic Metabolic Panel: Recent Labs  Lab 07/04/18 1646  07/06/18 2300 07/07/18 0120 07/07/18 0310 07/07/18 0548 07/07/18 1120  NA 112*   < > 123* 125* 126* 127* 126*  K 4.1   < > 3.5 3.5 3.7 4.0 3.8  CL  83*   < > 92* 93* 95* 95* 95*  CO2 21*   < > 21* 25 23 25  21*  GLUCOSE 108*   < > 179* 155* 125* 104* 169*  BUN 9   < > 7* 6* 7* 6* 5*  CREATININE 0.73   < > 0.78 0.74 0.70 0.73 0.84  CALCIUM 8.6*   < > 8.4* 8.3* 8.3* 8.4* 8.3*  MG 1.7  --   --   --   --   --   --    < > = values in this interval not displayed.   GFR: Estimated Creatinine Clearance: 96.7 mL/min (by C-G formula based on SCr of 0.84 mg/dL). Recent Labs  Lab 07/04/18 1646 07/05/18 0030 07/06/18 0431 07/07/18 0435  WBC 5.2 4.1 4.2 3.6*    Liver Function Tests: Recent Labs  Lab 07/04/18 1134  AST 22  ALT 27  ALKPHOS 108  BILITOT 0.5  PROT 6.4  ALBUMIN 4.1   Recent Labs  Lab 07/04/18 1134  LIPASE 26.0  AMYLASE 49   No results for input(s): AMMONIA in the last 168 hours.  ABG No results found for: PHART, PCO2ART, PO2ART, HCO3, TCO2, ACIDBASEDEF, O2SAT   Coagulation Profile:  Recent Labs  Lab 07/04/18 1646  INR 0.92    Cardiac Enzymes: No results for input(s): CKTOTAL, CKMB, CKMBINDEX, TROPONINI in the last 168 hours.  HbA1C: No results found for: HGBA1C  CBG: Recent Labs  Lab 07/04/18 2051 07/06/18 0235  GLUCAP 96 121*    This patient is critically ill with multiple organ system failure; which, requires frequent high complexity decision making, assessment, support, evaluation, and titration of therapies. This was completed through the application of advanced monitoring technologies and extensive interpretation of multiple databases. During this encounter critical care time was devoted to patient care services described in this note for 35 minutes.   Baltazar Apo, MD, PhD 07/07/2018, 3:00 PM [facility] [phone] or if no answer [phone]

## 2018-07-07 NOTE — Telephone Encounter (Signed)
Pt is in the hospital ICU at [facility] and wants you to come and see him while he is there.

## 2018-07-07 NOTE — Progress Notes (Signed)
Louretta Parma and this rn aroused pt from sleep to reiterate and explain need for central line insertion. Pt currently fully alert and orientedx4 and states he recalls being informed earlier today of plan for insertion. Pt asking for wife, pt informed wife currently in waiting room aware procedure in progress and will return to bedside after insertion. Pt informed and prepared for each procedural event by [facility] during insertion. Reinforcements provided to pt. Sterile drape positioned to allow this rn to be visible to pt. Explained to pt some general discomfort may be expected, pt instructed to notify for any severe pain during insertion. Reassurance and continuous positive reinforcement given to pt during procedure. Pt tolerated procedure well.

## 2018-07-07 NOTE — Progress Notes (Signed)
eLink Physician-Brief Progress Note Patient Name: Taylor Elliott DOB: Jun 09, 1951 MRN: [id number]   Date of Service  07/07/2018  HPI/Events of Note  Na+ = 124 on 3% NaCl at 30 mL/hour.   eICU Interventions  Continue present management.      Intervention Category Major Interventions: Electrolyte abnormality - evaluation and management  Sommer,Steven Eugene 07/07/2018, 9:24 PM

## 2018-07-07 NOTE — Progress Notes (Signed)
Orthopedic Tech Progress Note Patient Details:  Taylor Elliott [birth year] [id number]  Patient ID: Taylor Elliott, male   DOB: 1951-04-22, 67 y.o.   MRN: [id number] I talked to RN. She says pt has sling.  Karolee Stamps 07/07/2018, 1:22 PM

## 2018-07-07 NOTE — Progress Notes (Signed)
   07/07/18 0900  Clinical Encounter Type  Visited With Patient and family together  Visit Type Initial  Spiritual Encounters  Spiritual Needs Prayer  Responded to [facility] consult for AD. Provided AD document and explained the process. Patient will fill out and let nurse know when he is ready to notarize. Patient was alert and e a a friend at bedside. Patient requested prayer and I prayed with him. Will follow-up as requested.

## 2018-07-07 NOTE — Progress Notes (Addendum)
eLink Physician-Brief Progress Note Patient Name: Taylor Elliott DOB: 31-Dec-1950 MRN: [id number]   Date of Service  07/07/2018  HPI/Events of Note  Na+ = 117 --> 122 --> 124 --> 123 --> 125 --> 126. Not called with Na+ since 122.   eICU Interventions  Will order: 1. Decrease 3% NaCl IV infusion to 10 mL/hour.  2. D5W 500 mL IV over 30 minutes now.      Intervention Category Major Interventions: Electrolyte abnormality - evaluation and management  Dianne Bady Eugene 07/07/2018, 6:40 AM

## 2018-07-07 NOTE — Progress Notes (Signed)
Ativan 1 mg solution wasted in sink with nurse Rod Holler.

## 2018-07-08 ENCOUNTER — Inpatient Hospital Stay (HOSPITAL_COMMUNITY): Payer: 59

## 2018-07-08 ENCOUNTER — Telehealth: Payer: Self-pay

## 2018-07-08 DIAGNOSIS — M7541 Impingement syndrome of right shoulder: Secondary | ICD-10-CM | POA: Diagnosis not present

## 2018-07-08 DIAGNOSIS — R4701 Aphasia: Secondary | ICD-10-CM

## 2018-07-08 DIAGNOSIS — L97511 Non-pressure chronic ulcer of other part of right foot limited to breakdown of skin: Secondary | ICD-10-CM

## 2018-07-08 LAB — CBC
HCT: 32.7 % — ABNORMAL LOW (ref 39.0–52.0)
Hemoglobin: 10.9 g/dL — ABNORMAL LOW (ref 13.0–17.0)
MCH: 29.4 pg (ref 26.0–34.0)
MCHC: 33.3 g/dL (ref 30.0–36.0)
MCV: 88.1 fL (ref 80.0–100.0)
Platelets: 167 10*3/uL (ref 150–400)
RBC: 3.71 MIL/uL — ABNORMAL LOW (ref 4.22–5.81)
RDW: 13.1 % (ref 11.5–15.5)
WBC: 4.2 10*3/uL (ref 4.0–10.5)
nRBC: 0 % (ref 0.0–0.2)

## 2018-07-08 LAB — SODIUM
Sodium: 127 mmol/L — ABNORMAL LOW (ref 135–145)
Sodium: 128 mmol/L — ABNORMAL LOW (ref 135–145)
Sodium: 126 mmol/L — ABNORMAL LOW (ref 135–145)
Sodium: 126 mmol/L — ABNORMAL LOW (ref 135–145)
Sodium: 127 mmol/L — ABNORMAL LOW (ref 135–145)
Sodium: 128 mmol/L — ABNORMAL LOW (ref 135–145)

## 2018-07-08 LAB — CSF CULTURE W GRAM STAIN
Culture: NO GROWTH
Gram Stain: NONE SEEN

## 2018-07-08 LAB — CSF CULTURE

## 2018-07-08 MED ORDER — DICLOFENAC SODIUM 1 % TD GEL
2.0000 g | Freq: Four times a day (QID) | TRANSDERMAL | Status: DC
  Administered 2018-07-08 – 2018-07-16 (×15): 2 g via TOPICAL
  Filled 2018-07-08: qty 100

## 2018-07-08 MED ORDER — LORAZEPAM 2 MG/ML IJ SOLN
1.0000 mg | Freq: Once | INTRAMUSCULAR | Status: AC
  Administered 2018-07-08: 1 mg via INTRAVENOUS
  Filled 2018-07-08: qty 1

## 2018-07-08 MED ORDER — LEVETIRACETAM IN NACL 1000 MG/100ML IV SOLN
1000.0000 mg | Freq: Two times a day (BID) | INTRAVENOUS | Status: DC
  Administered 2018-07-09 – 2018-07-14 (×11): 1000 mg via INTRAVENOUS
  Filled 2018-07-08 (×12): qty 100

## 2018-07-08 MED ORDER — LEVETIRACETAM IN NACL 500 MG/100ML IV SOLN
500.0000 mg | Freq: Once | INTRAVENOUS | Status: AC
  Administered 2018-07-08: 500 mg via INTRAVENOUS
  Filled 2018-07-08: qty 100

## 2018-07-08 MED ORDER — SODIUM CHLORIDE 0.9 % IV SOLN
250.0000 mg | INTRAVENOUS | Status: AC
  Administered 2018-07-08: 250 mg via INTRAVENOUS
  Filled 2018-07-08: qty 2.5

## 2018-07-08 MED ORDER — SODIUM CHLORIDE 0.9 % IV SOLN
750.0000 mg | Freq: Two times a day (BID) | INTRAVENOUS | Status: DC
  Administered 2018-07-08: 750 mg via INTRAVENOUS
  Filled 2018-07-08 (×2): qty 7.5

## 2018-07-08 NOTE — Progress Notes (Signed)
[facility] Progress Note Patient Name: Taylor Elliott DOB: 1950-08-25 MRN: [id number]   Date of Service  07/08/2018  HPI/Events of Note  Na+ = 124 --> 126 --> 127  eICU Interventions  Will decrease 3% NaCl IV infusion to 20 mL/hour.      Intervention Category Major Interventions: Electrolyte abnormality - evaluation and management  Sommer,Steven Eugene 07/08/2018, 5:20 AM

## 2018-07-08 NOTE — Progress Notes (Signed)
vEEG LTM running. No skin breakdown with hookup. Educated Marine scientist and wife on event button. Notified Neuro

## 2018-07-08 NOTE — Progress Notes (Signed)
NAME:  Taylor Elliott, MRN:  [id number], DOB:  [birth year], LOS: 4 ADMISSION DATE:  07/04/2018, CONSULTATION DATE:  07/05/2018 REFERRING MD:  CHIEF COMPLAINT:  Hyponatremia  Brief History / History of Present Illness   Mr. Hamman is a 67 YO gentleman who has been transferred to ICU from [place] ED for higher level management of hyponatremia.  Patient states that he was in his USOH until about a week ago. He stated that he was in [place] for thanksgiving and that night when he got back home he developed acute onset vomiting and diarrhea. He states that he had multiple bouts of vomiting and diarrhea that lasted for about 2 days. He took imodium and his symptoms resolved. During the week he developed congestion and he obtained over the counter decongestants with some improvement. He denies fever or chills. He however states that he had had no appetite and therefore has not been eating. He states that he has been drinking gatorade and some water. He endorses occasional cough productive of clear sputum. He denies hemoptysis. He denies chest pain. He denies any sick contacts. He states that last night diarrhea recurred without vomiting. He had 2 bouts then took imodium. He thus decided to contact his PCP. He was seen by his PCP and had CXR and abdominal Xray that were reported as normal. He had lab works done that revealed hyponatremia with sodium of 113. He was advised to present emergently to the ER. He denies any seizures or dizziness. He states that he has had gait disturbances and imbalance for several years due to his peripheral neuropathy. He states that his imbalance has been progressively worsening over the past three months.   At the OSH ED he was found to have a sodium of 112., unremarkable CBC. He had a CT head done that showed no acute intracranial abnormality An LP was performed that showed elevated protein with normal cell count.    Past Medical History  -Right renal carcinoma status post  nephrectomy -Essential hypertension -Hyperlipidemia -Prostate cancer status post prostatectomy now with urinary incontinence -peripheral neuropathy of bilateral lower extremities -Status post Left trans metatarsal amputation  Significant Hospital Events   ICU admission 07/05/2018 12/8 - Seizure like episode, CT head negative, stat MRI/EEG - pending   Consults:  Critical care  Procedures:  Peripheral IVs  12/8 CT Head - Negative  12/8 MRI Brain - pending  12/8 EEG - pending   Significant Diagnostic Tests:  Serum sodium 112  Micro Data:  NGTD   Antimicrobials:  None   Subjective:  He is continued to have some word finding difficulty intermittently Remains on 3% NaCl, current rate 30 cc/h    Objective   Blood pressure (!) 149/84, pulse 72, temperature (!) 97.5 F (36.4 C), temperature source Axillary, resp. rate 15, height 5\' 8"  (1.727 m), weight 94.7 kg, SpO2 95 %.        Intake/Output Summary (Last 24 hours) at 07/08/2018 1027 Last data filed at 07/08/2018 1000 Gross per 24 hour  Intake 1343.15 ml  Output 800 ml  Net 543.15 ml   Filed Weights   07/04/18 2346 07/06/18 0355 07/08/18 0500  Weight: 91.7 kg 94.9 kg 94.7 kg    Examination: General appearance: Elderly man, up to chair Eyes: pupils equal HENT: OP moist, no lesions Neck: No JVD Lungs: Bilaterally, no crackles, no wheeze CV: Irr, no murmur Abdomen: Soft, nondistended, positive bowel sounds Extremities: No edema, history left metatarsal amputation Skin: No rash Psych:  A bit frustrated with being ill but overall appropriate Neuro: Wake, alert, interacting appropriately.  I cannot detect any word finding difficulty today which is an improvement compared with 10/9 (he did have the problem overnight last night).    Assessment & Plan:   Hyponatremia, with seizure/strokelike symptoms, no evidence CVA by MRI 12/8.  No active seizure activity by EEG 12/8 Initially presented with asymptomatic  hyponatremia.  Was given a liter of saline with response up from under 112 to 119.  Urine sodium 57. Elevated urine Osm suggest component of SIADH. Serum cortisol was slightly decreased below AM limits. Overall seems mixed issues.   3% saline reinitiated 12/9 when serum sodium began to trend down.  Remains on 30 cc/h. Note word finding difficulty 12/9 and also some residual today.  Repeat EEG this morning reassuring without any evidence of focal seizure activity.  An MRI has been ordered. Continue Keppra dosing as per neurology recommendations.  Could increase this if the word finding difficulty persists. CSF HSV and crypto antigen are still pending but little evidence here for meningitis I would continue his oral hydrocortisone until we see a stable trend of sodium, optimally once he is off the 3% saline infusion  Hypertension On lisinopril, amlodipine  Depression/Anxiety Continue to hold his SSRI (Paxil).  If he needs antidepressant medication going forward would avoid this class of medications  Hyperlipidemia Statin as ordered  Gait disturbance Imbalance Peripheral neuropathy Initiate PT/OT  Right rotator cuff injury Appreciate Dr. Jess Barters evaluation in the hospital Voltaren gel was recommended and we will set up follow-up in the outpatient setting  Tobacco abuse Needs smoking cessation Nicotine patch in place   Best practice:  Diet: Regular diet Pain/Anxiety/Delirium protocol (if indicated): Not indicated VAP protocol (if indicated):Not indicated  DVT prophylaxis: Enoxaparin GI prophylaxis: Not indicated  Glucose control: Maintain euglycemia Mobility: PT and OT to evaluate and treat Code Status: Discussed code status with the patient and he is full code Family Communication: Issues discussed with the patient and his wife on 12/10 Disposition: ICU until frequent labs in 3% saline are no longer required  Labs   CBC: Recent Labs  Lab 07/04/18 1134 07/04/18 1646  07/05/18 0030 07/06/18 0431 07/07/18 0435 07/08/18 0501  WBC 5.3 5.2 4.1 4.2 3.6* 4.2  NEUTROABS 4.4 4.2 3.1 3.2 2.8  --   HGB 13.5 12.9* 12.5* 12.3* 11.0* 10.9*  HCT 38.1* 36.1* 36.1* 35.6* 33.2* 32.7*  MCV 87.6 84.7 85.1 85.2 88.1 88.1  PLT 180.0 170 168 171 151 202    Basic Metabolic Panel: Recent Labs  Lab 07/04/18 1646  07/07/18 0120 07/07/18 0310 07/07/18 0548 07/07/18 1120 07/07/18 1459 07/07/18 1842 07/07/18 2258 07/08/18 0231 07/08/18 0501  NA 112*   < > 125* 126* 127* 126* 124* 124* 126* 127* 128*  K 4.1   < > 3.5 3.7 4.0 3.8 4.1  --   --   --   --   CL 83*   < > 93* 95* 95* 95* 92*  --   --   --   --   CO2 21*   < > 25 23 25  21* 22  --   --   --   --   GLUCOSE 108*   < > 155* 125* 104* 169* 116*  --   --   --   --   BUN 9   < > 6* 7* 6* 5* 6*  --   --   --   --  CREATININE 0.73   < > 0.74 0.70 0.73 0.84 0.82  --   --   --   --   CALCIUM 8.6*   < > 8.3* 8.3* 8.4* 8.3* 8.6*  --   --   --   --   MG 1.7  --   --   --   --   --   --   --   --   --   --    < > = values in this interval not displayed.   GFR: Estimated Creatinine Clearance: 98.9 mL/min (by C-G formula based on SCr of 0.82 mg/dL). Recent Labs  Lab 07/05/18 0030 07/06/18 0431 07/07/18 0435 07/08/18 0501  WBC 4.1 4.2 3.6* 4.2    Liver Function Tests: Recent Labs  Lab 07/04/18 1134  AST 22  ALT 27  ALKPHOS 108  BILITOT 0.5  PROT 6.4  ALBUMIN 4.1   Recent Labs  Lab 07/04/18 1134  LIPASE 26.0  AMYLASE 49   No results for input(s): AMMONIA in the last 168 hours.  ABG No results found for: PHART, PCO2ART, PO2ART, HCO3, TCO2, ACIDBASEDEF, O2SAT   Coagulation Profile: Recent Labs  Lab 07/04/18 1646  INR 0.92    Cardiac Enzymes: No results for input(s): CKTOTAL, CKMB, CKMBINDEX, TROPONINI in the last 168 hours.  HbA1C: No results found for: HGBA1C  CBG: Recent Labs  Lab 07/04/18 2051 07/06/18 0235  GLUCAP 96 121*    This patient is critically ill with multiple organ  system failure; which, requires frequent high complexity decision making, assessment, support, evaluation, and titration of therapies. This was completed through the application of advanced monitoring technologies and extensive interpretation of multiple databases. During this encounter critical care time was devoted to patient care services described in this note for 32 minutes.   Baltazar Apo, MD, PhD 07/08/2018, 10:27 AM [facility] [phone] or if no answer [phone]

## 2018-07-08 NOTE — Progress Notes (Signed)
The patient had a recurrent episode at about 3 PM of stereotyped eye blinking and mutism but was able to follow commands. Wife saw LUE twitching slightly. RUE was extended. Head was turned to the left during the spell as well. Total duration about 6 minutes. No postictal confusion or drowsiness, with normal speech noted immediately after the spell. HR changed slightly from 78 to 104. O2 sats were normal.   LTM EEG has been ordered.   Electronically signed: Dr. Kerney Elbe

## 2018-07-08 NOTE — Progress Notes (Signed)
2057 Call/paged neuro to updated on pt expressive aphasia.  Discussed with Dr. Leonel Ramsay.  Plan is to continue to monitor and reassess in AM.  2106 Elink called to notify of Na of 124.  No new orders.  0245 Pt becoming increasingly frustrated d/t expressive aphasia.  Pt at time punching the bed and pulling at equipment.  Pt and wife feel sx are getting worse and request MD intervention.  0300 eLink called.  Discussed situation with Elzie Rings, RN.  Elzie Rings advised she would pass current status to Dr. Beatrix Shipper.    3559 Pt/wife advised they would like Neuro called.  0310 Neuro page/called.  Dr, Leonel Ramsay updated.  [place] Dr. Leonel Ramsay to bedside.  New orders received.  0335 Reviewed with pt/wife plan as discussed by Dr. Leonel Ramsay and they are in agreement.  Will continue to monitor closely
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
## 2018-07-08 NOTE — Progress Notes (Signed)
EEG completed; results pending.    

## 2018-07-08 NOTE — Progress Notes (Signed)
Subjective: Called because patient was having increasing problems with word finding difficulty.  His wife describes approximately 10 episodes today where he becomes very frustrated with trying to get his words out.  Currently, he is having some difficulty with getting his words out, but is able to communicate.  He is able to relay to me that this comes and goes.  Exam: Vitals:   07/08/18 0103 07/08/18 0200  BP: (!) 141/67   Pulse:  96  Resp: (!) 22 20  Temp:    SpO2:  98%   Gen: In bed, NAD Resp: non-labored breathing, no acute distress Abd: soft, nt  Neuro: MS: Awake, alert, mild to moderate expressive aphasia CN: Pupils equal round and reactive, extraocular movements intact, visual fields are full, face is symmetric Motor: He moves all extremities with good strength Sensory: Intact light touch  Pertinent Labs: Sodium 127  Impression: 67 year old male with transient episodes of expressive aphasia in the setting of recent seizure and left frontal potential focus.  In this setting with episodic symptoms, do think some concern has to be had for possible nonconvulsive seizure activity.  I would favor next dose of Keppra and some Ativan to try and calm things down.  My suspicion for stroke is relatively low, and his symptoms currently would not merit intra-arterial intervention and therefore I would not proceed with emergent imaging.  Recommendations: 1) EEG 2) if still symptomatic in the morning, MRI brain repeat 3) Keppra 500 mg x 1, increased to 750 twice daily, Ativan 1 mg x 1  Roland Rack, MD [facility] [phone]  If 7pm- 7am, please page neurology on call as listed in [place].

## 2018-07-08 NOTE — Procedures (Signed)
ELECTROENCEPHALOGRAM REPORT   Patient: Taylor Elliott       Room #: 6V78I EEG No. ID: [id number] Age: 67 y.o.        Sex: male Referring Physician: Byrum Report Date:  07/08/2018        Interpreting Physician: Alexis Goodell  History: Taylor Elliott is an 67 y.o. male with transient episodes of expressive aphasia  Medications:  Zyloprim, Norvasc, ASA, Fenofibrate, Cortef, Keppra, Prinivil, Crestor  Conditions of Recording:  This is a 21 channel routine scalp EEG performed with bipolar and monopolar montages arranged in accordance to the international 10/20 system of electrode placement. One channel was dedicated to EKG recording.  The patient is in the awake, drowsy and asleep states.  Description:  The waking background activity consists of a low voltage, symmetrical, fairly well organized, 8 Hz alpha activity, seen from the parieto-occipital and posterior temporal regions.  Low voltage fast activity, poorly organized, is seen anteriorly and is at times superimposed on more posterior regions.  A mixture of theta and alpha rhythms are seen from the central and temporal regions. The patient drowses with slowing to irregular, low voltage theta and beta activity.   The patient goes in to a light sleep with symmetrical sleep spindles, vertex central sharp transients and irregular slow activity.  No epileptiform activity is noted.   Hyperventilation and intermittent photic stimulation were not performed.   IMPRESSION: Normal electroencephalogram, awake and asleep. There are no focal lateralizing or epileptiform features.   Alexis Goodell, MD Neurology [phone] 07/08/2018, 10:00 AM

## 2018-07-08 NOTE — Progress Notes (Addendum)
NEUROLOGY PROGRESS NOTE  Subjective: Now back to normal and speaking well. No further events since the episode of increasing word finding difficulty last night. Keppra was increased to 750 mg BID overnight.    Exam: Vitals:   07/08/18 0800 07/08/18 0900  BP: (!) 113/101 (!) 149/84  Pulse: 85 72  Resp: (!) 27 15  Temp:    SpO2: 100% 95%    Physical Exam   HEENT-  Normocephalic, no lesions, without obvious abnormality.  Normal external eye and conjunctiva.   Extremities- Warm, dry and intact Musculoskeletal-no joint tenderness, deformity or swelling Skin-warm and dry, no hyperpigmentation, vitiligo, or suspicious lesions    Neuro:  Mental Status: Alert, oriented, thought content appropriate.  Speech fluent without evidence of aphasia.  Able to follow 3 step commands without difficulty. Cranial Nerves: II:  Visual fields grossly normal,  III,IV, VI: ptosis not present, extra-ocular motions intact bilaterally pupils equal, round, reactive to light and accommodation V,VII: smile symmetric, facial light touch sensation normal bilaterally VIII: hearing normal bilaterally IX,X: uvula rises midline XI: bilateral shoulder shrug XII: midline tongue extension Motor: Right : Upper extremity   5/5    Left:     Upper extremity   5/5  Lower extremity   5/5     Lower extremity   5/5 Tone and bulk:normal tone throughout; no atrophy noted Sensory: Pinprick and light touch intact throughout, bilaterally Deep Tendon Reflexes: 2+ and symmetric throughout     Medications:  Scheduled: . allopurinol  200 mg Oral q morning - 10a  . amLODipine  10 mg Oral q morning - 10a  . aspirin EC  81 mg Oral Daily  . enoxaparin (LOVENOX) injection  40 mg Subcutaneous Q24H  . fenofibrate  160 mg Oral Daily  . hydrocortisone  5 mg Oral Daily  . lisinopril  20 mg Oral Daily  . nicotine  21 mg Transdermal Daily  . rosuvastatin  10 mg Oral q1800   Continuous: . levETIRAcetam 750 mg (07/08/18 0815)  .  sodium chloride (hypertonic) 20 mL/hr (07/08/18 0815)   GGY:IRSWNIOEVOJJK, ondansetron (ZOFRAN) IV, oxyCODONE  Pertinent Labs/Diagnostics: Sodium 128  EEG: Normal electroencephalogram, awake and asleep. There are no focal lateralizing or epileptiform features.  Dg Chest Port 1 View  Result Date: 07/06/2018 CLINICAL DATA:  Central line placement EXAM: PORTABLE CHEST 1 VIEW COMPARISON:  07/04/2018 FINDINGS: Lungs are essentially clear.  No pleural effusion or pneumothorax. The heart is normal in size. Right IJ venous catheter terminates in the lower SVC. Thoracic aortic atherosclerosis. IMPRESSION: Right IJ venous catheter terminates in the lower SVC. Electronically Signed   By: Julian Hy M.D.   On: 07/06/2018 21:34    Etta Quill PA-C [facility] [phone]   Assessment: 67 year old male with multiple brief episodes of expressive aphasia. Also with an episode of frank seizure activity following the initial Neurology consult on 12/8, consisting of head version to the left with left gaze deviation and left upper extremity tonic-clonic jerking lasting approximately 2 minutes which resolved with Ativan. He was started on Keppra at that time. Overnight (12/9-10) he had a recurrent spell of word finding difficulty after having approximately 10 episodes on Monday where he becomes very frustrated with trying to get his words out.   1. Overall presentation most consistent with an initial provoked seizure in the setting of hyponatremia, followed by recurrent partial complex seizures.  2. MRI brain shows probable left parietal arachnoid cyst. Arachnoid cysts are almost always asymptomatic and the  finding on this scan is felt unlikely to be exerting mass effect sufficient to result in a structural cortical abnormality or reduced seizure threshold in the adjacent cerebral cortex.   3. Initial EEG on 12/8 revealed triphasic waves with no electrographic seizures. Repeat EEG from today  shows no epileptiform activity after Keppra was increased to 750 mg BID.  4. No longer symptomatic since Keppra was increased overnight. Hold off on repeat MRI as stroke is felt to be unlikely.    Recommendations: 1. Continue Keppra 2. Continue to correct hyponatremia gradually, no more than 10 points per day. 3. Per [facility] statutes, patients with seizures are not allowed to drive until  they have been seizure-free for six months. Use caution when using heavy equipment or power tools. Avoid working on ladders or at heights. Take showers instead of baths. Ensure the water temperature is not too high on the home water heater. Do not go swimming alone. When caring for infants or small children, sit down when holding, feeding, or changing them to minimize risk of injury to the child in the event you have a seizure. Also, Maintain good sleep hygiene. Avoid alcohol.   Electronically signed: Dr. Kerney Elbe 07/08/2018, 10:18 AM

## 2018-07-08 NOTE — Telephone Encounter (Signed)
Called patient to schedule hospital follow up with Dr. Carollee Herter. Left message for return call.

## 2018-07-09 DIAGNOSIS — R4182 Altered mental status, unspecified: Secondary | ICD-10-CM

## 2018-07-09 LAB — CBC
HCT: 32.5 % — ABNORMAL LOW (ref 39.0–52.0)
Hemoglobin: 10.6 g/dL — ABNORMAL LOW (ref 13.0–17.0)
MCH: 29 pg (ref 26.0–34.0)
MCHC: 32.6 g/dL (ref 30.0–36.0)
MCV: 89 fL (ref 80.0–100.0)
Platelets: 157 10*3/uL (ref 150–400)
RBC: 3.65 MIL/uL — ABNORMAL LOW (ref 4.22–5.81)
RDW: 12.9 % (ref 11.5–15.5)
WBC: 3.4 10*3/uL — ABNORMAL LOW (ref 4.0–10.5)
nRBC: 0 % (ref 0.0–0.2)

## 2018-07-09 LAB — COMPREHENSIVE METABOLIC PANEL
ALT: 23 U/L (ref 0–44)
AST: 17 U/L (ref 15–41)
Albumin: 2.8 g/dL — ABNORMAL LOW (ref 3.5–5.0)
Alkaline Phosphatase: 91 U/L (ref 38–126)
Anion gap: 7 (ref 5–15)
BUN: 6 mg/dL — ABNORMAL LOW (ref 8–23)
CO2: 25 mmol/L (ref 22–32)
Calcium: 8.5 mg/dL — ABNORMAL LOW (ref 8.9–10.3)
Chloride: 98 mmol/L (ref 98–111)
Creatinine, Ser: 0.82 mg/dL (ref 0.61–1.24)
GFR calc Af Amer: >60 mL/min (ref >60)
GFR calc non Af Amer: >60 mL/min (ref >60)
Glucose, Bld: 181 mg/dL — ABNORMAL HIGH (ref 70–99)
Potassium: 3.3 mmol/L — ABNORMAL LOW (ref 3.5–5.1)
Sodium: 130 mmol/L — ABNORMAL LOW (ref 135–145)
Total Bilirubin: 0.4 mg/dL (ref 0.3–1.2)
Total Protein: 5.2 g/dL — ABNORMAL LOW (ref 6.5–8.1)

## 2018-07-09 LAB — SODIUM
Sodium: 133 mmol/L — ABNORMAL LOW (ref 135–145)
Sodium: 134 mmol/L — ABNORMAL LOW (ref 135–145)
Sodium: 135 mmol/L (ref 135–145)
Sodium: 136 mmol/L (ref 135–145)

## 2018-07-09 LAB — GLUCOSE, CAPILLARY: Glucose-Capillary: 103 mg/dL — ABNORMAL HIGH (ref 70–99)

## 2018-07-09 MED ORDER — LORAZEPAM 2 MG/ML IJ SOLN
INTRAMUSCULAR | Status: AC
  Filled 2018-07-09: qty 1

## 2018-07-09 MED ORDER — CLONAZEPAM 0.5 MG PO TABS
0.5000 mg | ORAL_TABLET | Freq: Two times a day (BID) | ORAL | Status: DC
  Administered 2018-07-09 – 2018-07-10 (×3): 0.5 mg via ORAL
  Filled 2018-07-09 (×3): qty 1

## 2018-07-09 MED ORDER — POTASSIUM CHLORIDE CRYS ER 20 MEQ PO TBCR
20.0000 meq | EXTENDED_RELEASE_TABLET | ORAL | Status: AC
  Administered 2018-07-09: 20 meq via ORAL
  Filled 2018-07-09: qty 1

## 2018-07-09 MED ORDER — TRAZODONE HCL 100 MG PO TABS
100.0000 mg | ORAL_TABLET | Freq: Every day | ORAL | Status: DC
  Administered 2018-07-09 – 2018-07-12 (×4): 100 mg via ORAL
  Filled 2018-07-09 (×5): qty 1

## 2018-07-09 MED ORDER — SODIUM CHLORIDE 0.9 % IV SOLN
1000.0000 mg | Freq: Every day | INTRAVENOUS | Status: AC
  Administered 2018-07-09 – 2018-07-13 (×5): 1000 mg via INTRAVENOUS
  Filled 2018-07-09 (×6): qty 8

## 2018-07-09 MED ORDER — LORAZEPAM 2 MG/ML IJ SOLN
2.0000 mg | Freq: Once | INTRAMUSCULAR | Status: AC
  Administered 2018-07-09: 2 mg via INTRAVENOUS

## 2018-07-09 NOTE — Progress Notes (Signed)
Physical Therapy Evaluation Patient Details Name: Taylor Elliott MRN: [id number] DOB: Dec 14, 1950 Today's Date: 07/09/2018   History of Present Illness  Pt transferred from [place] [facility] for higher level mgt of hyponatremia on 12/07 after week bout of worsening vomiting and diarrhea. Pt also with seizure/stroke like symptoms with further workup being done at time of PT evaluation 07/09/2018. PMH includes: prostate and kidney CA, peripheral neuropathy of BLE, s/p L trans met amputation, HLD, HTN.     Clinical Impression  Pt admitted with above diagnosis. Pt currently with functional limitations due to the deficits listed below (see PT Problem List). PTA, pt independent living at home with wife. Today patient presets with word finding difficulties, stuttering speech, very detail oriented and specific about completing tasks on his own despite prolonged time to accomplish task (15 minutes to put on shoes). Pt emotional when standing expressing "it means so much for me to stand again". Pt appreciative of therapy session and wishes to walk next visit. With good progress, may be able to progress home, if not may need to consider post-acute rehab. Pt not at his baseline with mobility, standing and side stepping along bed today for majority of visit.  Pt will benefit from skilled PT to increase their independence and safety with mobility to allow discharge to the venue listed below.       Follow Up Recommendations Home health PT;Supervision/Assistance - 24 hour(pending large progress)    Equipment Recommendations  (TBD)    Recommendations for Other Services OT consult     Precautions / Restrictions Precautions Precautions: Fall Required Braces or Orthoses: (L AFO) Restrictions Weight Bearing Restrictions: No      Mobility  Bed Mobility Overal bed mobility: Modified Independent                Transfers Overall transfer level: Needs assistance Equipment used: Rolling walker (2  wheeled) Transfers: Sit to/from Stand Sit to Stand: Min assist         General transfer comment: Min A to stand x5 today. patient very particular about how he wants to sequence transfers. becoming emotional crying at how happy he is to stand. requests to stand for a prolonged time.   Ambulation/Gait             General Gait Details: deferred due to patients instability and cogntitive status. side stepped along bed which took 15 minutes due to patients deamnor and wish to do things his own way.   Stairs            Wheelchair Mobility    Modified Rankin (Stroke Patients Only)       Balance Overall balance assessment: Needs assistance   Sitting balance-Leahy Scale: Fair       Standing balance-Leahy Scale: Poor                               Pertinent Vitals/Pain Pain Assessment: No/denies pain    Home Living Family/patient expects to be discharged to:: Private residence Living Arrangements: Spouse/significant other Available Help at Discharge: Family;Available PRN/intermittently Type of Home: House Home Access: Stairs to enter Entrance Stairs-Rails: Right;Left;Can reach both Entrance Stairs-Number of Steps: 2 Home Layout: One level Home Equipment: Crutches      Prior Function Level of Independence: Independent         Comments: ambulating without AD     Hand Dominance        Extremity/Trunk Assessment  Upper Extremity Assessment Upper Extremity Assessment: Overall WFL for tasks assessed    Lower Extremity Assessment Lower Extremity Assessment: Overall WFL for tasks assessed(R drop foot noted)       Communication   Communication: (stuttering and word finding difficulties)  Cognition Arousal/Alertness: Awake/alert Behavior During Therapy: WFL for tasks assessed/performed                                   General Comments: emotional during visit.       General Comments General comments (skin integrity,  edema, etc.): Son present, helpful in encourging father.     Exercises     Assessment/Plan    PT Assessment Patient needs continued PT services  PT Problem List Decreased activity tolerance       PT Treatment Interventions DME instruction;Gait training;Stair training;Functional mobility training;Therapeutic activities;Therapeutic exercise    PT Goals (Current goals can be found in the Care Plan section)  Acute Rehab PT Goals Patient Stated Goal: walk again, figure out what is going on with his speech and cognition  PT Goal Formulation: With patient/family Time For Goal Achievement: 07/23/18 Potential to Achieve Goals: Fair    Frequency Min 3X/week   Barriers to discharge        Co-evaluation               AM-PAC PT "6 Clicks" Mobility  Outcome Measure Help needed turning from your back to your side while in a flat bed without using bedrails?: A Little Help needed moving from lying on your back to sitting on the side of a flat bed without using bedrails?: A Little Help needed moving to and from a bed to a chair (including a wheelchair)?: A Little Help needed standing up from a chair using your arms (e.g., wheelchair or bedside chair)?: A Little Help needed to walk in hospital room?: A Lot Help needed climbing 3-5 steps with a railing? : A Lot 6 Click Score: 16    End of Session Equipment Utilized During Treatment: Gait belt Activity Tolerance: Patient tolerated treatment well Patient left: in bed;with call bell/phone within reach;with family/visitor present;with bed alarm set Nurse Communication: Mobility status PT Visit Diagnosis: Unsteadiness on feet (R26.81)    Time: 5183-4373 PT Time Calculation (min) (ACUTE ONLY): 57 min   Charges:   PT Evaluation $PT Eval High Complexity: 1 High PT Treatments $Therapeutic Activity: 38-52 mins        Reinaldo Berber, PT, DPT Acute Rehabilitation Services Pager: [phone] Office: [phone]    Reinaldo Berber 07/09/2018, 6:57 PM

## 2018-07-09 NOTE — Progress Notes (Addendum)
Subjective: LTM is running. The patient has had several spells of impaired speech output overnight.   Objective: Current vital signs: BP (!) 156/91 (BP Location: Right Arm)   Pulse 93   Temp 97.9 F (36.6 C) (Oral)   Resp (!) 25   Ht 5\' 8"  (1.727 m)   Wt 91.4 kg   SpO2 100%   BMI 30.64 kg/m  Vital signs in last 24 hours: Temp:  [97.4 F (36.3 C)-97.9 F (36.6 C)] 97.9 F (36.6 C) (12/11 0700) Pulse Rate:  [68-103] 93 (12/11 0800) Resp:  [12-25] 25 (12/11 0800) BP: (107-179)/(50-148) 156/91 (12/11 0800) SpO2:  [89 %-100 %] 100 % (12/11 0800) Weight:  [91.4 kg] 91.4 kg (12/11 0500)  Intake/Output from previous day: 12/10 0701 - 12/11 0700 In: 1843.8 [P.O.:450; I.V.:975.5; IV Piggyback:418.3] Out: 1750 [Urine:1750] Intake/Output this shift: Total I/O In: 50 [I.V.:50] Out: -  Nutritional status:  Diet Order            Diet regular Room service appropriate? Yes; Fluid consistency: Thin; Fluid restriction: Other (see comments)  Diet effective now              Neurologic Exam: Ment: On entering the room, patient is observed to have eyes closed and appears to be sleeping. On attempting to arouse patient, he keeps eyes closed and starts to murmur incoherently in a stuttering fashion. With repeated verbal attempts to arouse the patient, he gradually begins to exhibit more normal speech output, but remains with florid stuttering quality in addition to agitation. Makes several oppositional and somewhat interpersonally inappropriate statements during the exam - at these times the stuttering is at its least prominent and speech is quite clear and fluent. The speech pattern waxes and wanes during the exam without any correlate on EEG except for muscle artifact.  CN: PERRL. Moves eyes conjugately in an erratic pattern initially, which appears non-physiological. No nystagmus noted. When agitated and making oppositional comments about his care, the patient's eye movements are observed to  be smooth and conjugate without abnormality. Facial twitching occurs initially, worsens with agitation and resolves when distracted.  Motor: Will follow motor commands even during the initial part of the exam when eyes were closed and he was apparently less responsive. 5/5 strength in upper extremities, with 5/5 hip flexion and knee extension to lower extremities.  Reflexes: Normoactive.  Cerebellar: No ataxia with FNF bilaterally. LUE stops in mid-air for several seconds during testing which appears to be non-physiological. The patient at times exhibits a florid upper extremity tremor that waxes and wanes and does not appear physiological, resolving when distracted.    Lab Results: Results for orders placed or performed during the hospital encounter of 07/04/18 (from the past 48 hour(s))  Basic metabolic panel     Status: Abnormal   Collection Time: 07/07/18 11:20 AM  Result Value Ref Range   Sodium 126 (L) 135 - 145 mmol/L   Potassium 3.8 3.5 - 5.1 mmol/L   Chloride 95 (L) 98 - 111 mmol/L   CO2 21 (L) 22 - 32 mmol/L   Glucose, Bld 169 (H) 70 - 99 mg/dL   BUN 5 (L) 8 - 23 mg/dL   Creatinine, Ser 0.84 0.61 - 1.24 mg/dL   Calcium 8.3 (L) 8.9 - 10.3 mg/dL   GFR calc non Af Amer >60 >60 mL/min   GFR calc Af Amer >60 >60 mL/min   Anion gap 10 5 - 15    Comment: Performed at [facility],  [address] [place], [place] [address]  Basic metabolic panel     Status: Abnormal   Collection Time: 07/07/18  2:59 PM  Result Value Ref Range   Sodium 124 (L) 135 - 145 mmol/L   Potassium 4.1 3.5 - 5.1 mmol/L   Chloride 92 (L) 98 - 111 mmol/L   CO2 22 22 - 32 mmol/L   Glucose, Bld 116 (H) 70 - 99 mg/dL   BUN 6 (L) 8 - 23 mg/dL   Creatinine, Ser 0.82 0.61 - 1.24 mg/dL   Calcium 8.6 (L) 8.9 - 10.3 mg/dL   GFR calc non Af Amer >60 >60 mL/min   GFR calc Af Amer >60 >60 mL/min   Anion gap 10 5 - 15    Comment: Performed at [facility] [address], [place], [place] [address]  Sodium      Status: Abnormal   Collection Time: 07/07/18  6:42 PM  Result Value Ref Range   Sodium 124 (L) 135 - 145 mmol/L    Comment: Performed at [facility] [address], [place], [place] [address]  Sodium     Status: Abnormal   Collection Time: 07/07/18 10:58 PM  Result Value Ref Range   Sodium 126 (L) 135 - 145 mmol/L    Comment: Performed at [facility], [place] [address], [place], [place] [address]  Sodium     Status: Abnormal   Collection Time: 07/08/18  2:31 AM  Result Value Ref Range   Sodium 127 (L) 135 - 145 mmol/L    Comment: Performed at [facility] [address], [place], [place] [address]  Sodium     Status: Abnormal   Collection Time: 07/08/18  5:01 AM  Result Value Ref Range   Sodium 128 (L) 135 - 145 mmol/L    Comment: Performed at [facility], [place] [address], [place], [place] [address]  CBC     Status: Abnormal   Collection Time: 07/08/18  5:01 AM  Result Value Ref Range   WBC 4.2 4.0 - 10.5 K/uL   RBC 3.71 (L) 4.22 - 5.81 MIL/uL   Hemoglobin 10.9 (L) 13.0 - 17.0 g/dL   HCT 32.7 (L) 39.0 - 52.0 %   MCV 88.1 80.0 - 100.0 fL   MCH 29.4 26.0 - 34.0 pg   MCHC 33.3 30.0 - 36.0 g/dL   RDW 13.1 11.5 - 15.5 %   Platelets 167 150 - 400 K/uL   nRBC 0.0 0.0 - 0.2 %    Comment: Performed at [facility], [place] [address], [place], [place] [address]  Sodium     Status: Abnormal   Collection Time: 07/08/18 11:00 AM  Result Value Ref Range   Sodium 126 (L) 135 - 145 mmol/L    Comment: Performed at [facility] [address] [place], [place] [address]  Sodium     Status: Abnormal   Collection Time: 07/08/18  3:00 PM  Result Value Ref Range   Sodium 126 (L) 135 - 145 mmol/L    Comment: Performed at [facility], [place] [address] [place], [place] [address]  Sodium     Status: Abnormal   Collection Time: 07/08/18  6:25 PM  Result Value Ref Range   Sodium 127 (L) 135 - 145 mmol/L    Comment: Performed at [facility],  [place] [address] [place], [place] [address]  Sodium  Status: Abnormal   Collection Time: 07/08/18 11:00 PM  Result Value Ref Range   Sodium 128 (L) 135 - 145 mmol/L    Comment: Performed at [facility], [place] [address] [place], [place] [address]  Comprehensive metabolic panel     Status: Abnormal   Collection Time: 07/09/18  3:19 AM  Result Value Ref Range   Sodium 130 (L) 135 - 145 mmol/L   Potassium 3.3 (L) 3.5 - 5.1 mmol/L   Chloride 98 98 - 111 mmol/L   CO2 25 22 - 32 mmol/L   Glucose, Bld 181 (H) 70 - 99 mg/dL   BUN 6 (L) 8 - 23 mg/dL   Creatinine, Ser 0.82 0.61 - 1.24 mg/dL   Calcium 8.5 (L) 8.9 - 10.3 mg/dL   Total Protein 5.2 (L) 6.5 - 8.1 g/dL   Albumin 2.8 (L) 3.5 - 5.0 g/dL   AST 17 15 - 41 U/L   ALT 23 0 - 44 U/L   Alkaline Phosphatase 91 38 - 126 U/L   Total Bilirubin 0.4 0.3 - 1.2 mg/dL   GFR calc non Af Amer >60 >60 mL/min   GFR calc Af Amer >60 >60 mL/min   Anion gap 7 5 - 15    Comment: Performed at [facility], [place] [address], [place], [place] [address]  CBC     Status: Abnormal   Collection Time: 07/09/18  3:19 AM  Result Value Ref Range   WBC 3.4 (L) 4.0 - 10.5 K/uL   RBC 3.65 (L) 4.22 - 5.81 MIL/uL   Hemoglobin 10.6 (L) 13.0 - 17.0 g/dL   HCT 32.5 (L) 39.0 - 52.0 %   MCV 89.0 80.0 - 100.0 fL   MCH 29.0 26.0 - 34.0 pg   MCHC 32.6 30.0 - 36.0 g/dL   RDW 12.9 11.5 - 15.5 %   Platelets 157 150 - 400 K/uL   nRBC 0.0 0.0 - 0.2 %    Comment: Performed at [facility], [place] [address] [place], [place] [address]  Sodium     Status: Abnormal   Collection Time: 07/09/18  7:09 AM  Result Value Ref Range   Sodium 133 (L) 135 - 145 mmol/L    Comment: Performed at [facility] [address], [place], [place] [address]    Recent Results (from the past 240 hour(s))  CSF culture     Status: None   Collection Time: 07/04/18  9:49 PM  Result Value Ref Range Status   Specimen Description CSF  Final   Special Requests NONE  Final   Gram  Stain NO WBC SEEN NO ORGANISMS SEEN   Final   Culture   Final    NO GROWTH 3 DAYS Performed at [facility], [place] [address], [place], [place] [address]    Report Status 07/08/2018 FINAL  Final  MRSA PCR Screening     Status: None   Collection Time: 07/04/18 11:54 PM  Result Value Ref Range Status   MRSA by PCR NEGATIVE NEGATIVE Final    Comment:        The GeneXpert MRSA Assay (FDA approved for NASAL specimens only), is one component of a comprehensive MRSA colonization surveillance program. It is not intended to diagnose MRSA infection nor to guide or monitor treatment for MRSA infections. Performed at [facility], [place] [address], [place], [place] [address]   Culture, blood (routine x 2)     Status:  None (Preliminary result)   Collection Time: 07/05/18 12:30 AM  Result Value Ref Range Status   Specimen Description BLOOD RIGHT ANTECUBITAL  Final   Special Requests   Final    BOTTLES DRAWN AEROBIC ONLY Blood Culture adequate volume   Culture   Final    NO GROWTH 3 DAYS Performed at [facility], [address] [place], [place] [address]    Report Status PENDING  Incomplete  Culture, blood (routine x 2)     Status: None (Preliminary result)   Collection Time: 07/05/18 12:36 AM  Result Value Ref Range Status   Specimen Description BLOOD LEFT ANTECUBITAL  Final   Special Requests   Final    BOTTLES DRAWN AEROBIC ONLY Blood Culture adequate volume   Culture   Final    NO GROWTH 3 DAYS Performed at [facility], [place] [address], [place], [place] [address]    Report Status PENDING  Incomplete    Lipid Panel No results for input(s): CHOL, TRIG, HDL, CHOLHDL, VLDL, LDLCALC in the last 72 hours.  Studies/Results: No results found.  Medications:  Scheduled: . allopurinol  200 mg Oral q morning - 10a  . amLODipine  10 mg Oral q morning - 10a  . aspirin EC  81 mg Oral Daily  . diclofenac sodium  2 g Topical QID  . enoxaparin (LOVENOX) injection   40 mg Subcutaneous Q24H  . fenofibrate  160 mg Oral Daily  . hydrocortisone  5 mg Oral Daily  . lisinopril  20 mg Oral Daily  . nicotine  21 mg Transdermal Daily  . potassium chloride  20 mEq Oral Q4H  . rosuvastatin  10 mg Oral q1800   Continuous: . levETIRAcetam 1,000 mg (07/09/18 0402)  . sodium chloride (hypertonic) 50 mL/hr at 07/09/18 0800    Assessment: 67 year old male with multiple brief episodes of expressive aphasia. Also with an episode of frank seizure activity following the initial Neurology consult on 12/8, consisting of head version to the left with left gaze deviation and left upper extremity tonic-clonic jerking lasting approximately 2 minutes which resolved with Ativan. He was started on Keppra at that time. Overnight on 12/9-10 he had a recurrent spell of word finding difficulty after having approximately 10 episodes on Monday where he becomes very frustrated with trying to get his words out.  Overnight 12/10-11 he had additional spells that were captured on LTM EEG.  1. Overall presentation most consistent with an initial provoked seizure in the setting of hyponatremia, followed by either recurrent partial complex seizures or, more likely, pseudoseizures.  2. MRI brain shows probable left parietal arachnoid cyst. Arachnoid cysts are almost always asymptomatic and the finding on this scan is felt unlikely to be exerting mass effect sufficient to result in a structural cortical abnormality or reduced seizure threshold in the adjacent cerebral cortex.   3. Initial EEG on 12/8 revealed triphasic waves with no electrographic seizures. Repeat EEG from yesterday showed no epileptiform activity after Keppra was increased to 750 mg BID.  4. Has had florid recurrence of what appears most likely to be pseudoseizure activity and psychogenic speech deficit with odd stuttering pattern admixed with frequent pejorative statements.    Recommendations: 1. Continue Keppra 2. Continue to  correct hyponatremia gradually, no more than 10 points per day. 3. LTM EEG report for this morning is pending.  4. May need a psychology evaluation if EEG shows no electrographic correlate for his current speech deficit.  5. Would start the patient on Buspar for anxiety. He is receiving IV Ativan x 1 now.  6. Per [facility] statutes, patients with seizures are not allowed to drive until  they have been seizure-free for six months. Use caution when using heavy equipment or power tools. Avoid working on ladders or at heights. Take showers instead of baths. Ensure the water temperature is not too high on the home water heater. Do not go swimming alone. When caring for infants or small children, sit down when holding, feeding, or changing them to minimize risk of injury to the child in the event you have a seizure. Also, Maintain good sleep hygiene. Avoid alcohol.  Addendum: -- LTM EEG report resulted: This EEG is within normal limits. No epileptiform discharges or EEG seizures were recorded. Multiple patient events were recorded with no EEG change. -- Following IV Ativan, the patient is somnolent but with full resolution of stuttering speech quality and agitation when he is aroused with gentle sternal rub.  -- CSF had shown an abnormal elevated protein of 126. WBC in CSF of 1 and glucose 55. To further evaluate for possible autoimmune encephalopathy, an NMDA antibody titer has been ordered for the remaining CSF sample and also will be run on serum.  -- Serum thyroglobulin and microsomal antibody levels have also been ordered.  -- Serum LGI-1 antibody to assess for possible anti-LGI-1 autoimmune encephalitis with hyponatremia (can cause SIADH along with the encephalitis) has been ordered as a [facility] sendout. Serum anti-VGKC antibody level also has been ordered as this also can result in an autoimmune encephalitis with hyponatremia.  -- CSF culture shows no growth x 3 days -- Discontinuing LTM EEG.   -- Trazodone 100 mg po qhs for sleep -- Nephrology consult to further assess underlying etiologies for the patient's hyponatremia, including SIADH  A total of 40 minutes was spent in the evaluation and management of the patient. >50% of time was spent discussing the patient's condition, EEG findings, MRI findings and current diagnostic and treatment plan with the patient and his wife.    LOS: 5 days   @Electronically  signed: Dr. Kerney Elbe 07/09/2018  9:27 AM

## 2018-07-09 NOTE — Progress Notes (Signed)
RN noting a steady increase in neuro symptoms and anxiety. Family noticed around 1100 that pt was speaking slower and was progressively having more suttering. Approx 1400 pt stuttering much worse and  having difficulty speaking. Family at [facility].  Dr Lamonte Sakai notified. Vitals WNL. Family requesting Neuro to come to [facility].  Sodium is 135 now.

## 2018-07-09 NOTE — Progress Notes (Signed)
Unable to add-on labs to CSF. Lab does NOT have a saved CSF Sample.

## 2018-07-09 NOTE — Progress Notes (Addendum)
NAME:  Taylor Elliott, MRN:  [id number], DOB:  [birth year], LOS: 5 ADMISSION DATE:  07/04/2018, CONSULTATION DATE:  07/05/2018 REFERRING MD:  CHIEF COMPLAINT:  Hyponatremia  Brief History / History of Present Illness   Taylor Elliott is a 67 YO gentleman who has been transferred [place] ICU [place] for higher level management of hyponatremia.  Patient states that he was in his USOH until about a week ago. He stated that he was in [place] for thanksgiving and that night when he got back home he developed acute onset vomiting and diarrhea. He states that he had multiple bouts of vomiting and diarrhea that lasted for about 2 days. He took imodium and his symptoms resolved. During the week he developed congestion and he obtained over the counter decongestants with some improvement. He denies fever or chills. He however states that he had had no appetite and therefore has not been eating. He states that he has been drinking gatorade and some water. He endorses occasional cough productive of clear sputum. He denies hemoptysis. He denies chest pain. He denies any sick contacts. He states that last night diarrhea recurred without vomiting. He had 2 bouts then took imodium. He thus decided to contact his PCP. He was seen by his PCP and had CXR and abdominal Xray that were reported as normal. He had lab works done that revealed hyponatremia with sodium of 113. He was advised to present emergently to the ER. He denies any seizures or dizziness. He states that he has had gait disturbances and imbalance for several years due to his peripheral neuropathy. He states that his imbalance has been progressively worsening over the past three months.   At the OSH ED he was found to have a sodium of 112., unremarkable CBC. He had a CT head done that showed no acute intracranial abnormality An LP was performed that showed elevated protein with normal cell count.    Past Medical History  -Right renal carcinoma status post  nephrectomy -Essential hypertension -Hyperlipidemia -Prostate cancer status post prostatectomy now with urinary incontinence -peripheral neuropathy of bilateral lower extremities -Status post Left trans metatarsal amputation  Significant Hospital Events   ICU admission 07/05/2018 12/8 - Seizure like episode, CT head negative, stat MRI/EEG - pending   Consults:  Critical care  Procedures:  Peripheral IVs  12/8 CT Head - Negative  12/8 MRI Brain - pending  12/8 EEG - pending   Significant Diagnostic Tests:  Serum sodium 112  Micro Data:  NGTD   Antimicrobials:  None   Subjective:  [name] has had some recurrent episodes of dysarthria and apparent expressive aphasia with eye blinking.  This is happened again this morning 12/11. Continuous EEG results reviewed, no evidence of active seizures during the events. Sodium up to 133  Objective   Blood pressure 111/66, pulse 74, temperature 97.9 F (36.6 C), temperature source Oral, resp. rate (!) 25, height 5\' 8"  (1.727 m), weight 91.4 kg, SpO2 96 %.        Intake/Output Summary (Last 24 hours) at 07/09/2018 1051 Last data filed at 07/09/2018 1000 Gross per 24 hour  Intake 1576.84 ml  Output 1200 ml  Net 376.84 ml   Filed Weights   07/06/18 0355 07/08/18 0500 07/09/18 0500  Weight: 94.9 kg 94.7 kg 91.4 kg    Examination: General appearance: Sleeping comfortably Eyes: Pupils equal HENT: Oropharynx moist, no oral lesions Neck: No JVD Lungs: Clear bilaterally CV: Irregular, no murmur Abdomen: Soft, nondistended,  positive bowel sounds Extremities: Left transmetatarsal amputation, no edema Skin: No rash Psych: Somnolent currently, interacts appropriately Neuro: Sleeping, no evidence of anxiety or word finding difficulty currently.  Moves all extremities without difficulty    Assessment & Plan:   Hyponatremia, with seizure/strokelike symptoms, no evidence CVA by MRI 12/8.  No active seizure activity by EEG  12/8 Initially presented with asymptomatic hyponatremia.  Was given a liter of saline with response up from under 112 to 119.  Urine sodium 57. Elevated urine Osm suggest component of SIADH. Serum cortisol was slightly decreased below AM limits. Overall seems mixed causation.   Sodium is up to 133.  Continue current rate of 3% saline, follow next sodium value.  We may be able to start weaning with goal to off by this afternoon. Greatly appreciate Dr. Yvetta Coder assistance with neurology.  The EEG is reassuring, no evidence of seizure activity at the times of his episodes.  Possibly some residual irritation, superimposed anxiety component, possibly even a functional component as well. Continue current Keppra dosing per neurology recommendations.  I increase this to 1000 mg every 12 hours on 12/10. None of his LP results have been revealing. Continue empiric oral hydrocortisone now until we see a stable trend of his sodium, optimally after he is off to 3% saline.  Hypertension Continue lisinopril, amlodipine  Depression/Anxiety Off his Paxil.  Would continue to hold this, avoid SSRI in the future given potential contribution to hyponatremia  Hyperlipidemia Statin as ordered  Gait disturbance Imbalance Peripheral neuropathy PT OT to assist with ambulation  Right rotator cuff injury Appreciate Dr. Jess Barters evaluation in the hospital Voltaren gel was recommended and we will set up follow-up in the outpatient setting  Tobacco abuse Smoking cessation, nicotine patch in place   Best practice:  Diet: Regular diet Pain/Anxiety/Delirium protocol (if indicated): Not indicated VAP protocol (if indicated):Not indicated  DVT prophylaxis: Enoxaparin GI prophylaxis: Not indicated  Glucose control: Maintain euglycemia Mobility: PT and OT to evaluate and treat Code Status: Discussed code status with the patient and he is full code Family Communication: Issues discussed with the patient and his  wife on 12/11 Disposition: ICU until frequent labs in 3% saline are no longer required  Labs   CBC: Recent Labs  Lab 07/04/18 1134 07/04/18 1646 07/05/18 0030 07/06/18 0431 07/07/18 0435 07/08/18 0501 07/09/18 0319  WBC 5.3 5.2 4.1 4.2 3.6* 4.2 3.4*  NEUTROABS 4.4 4.2 3.1 3.2 2.8  --   --   HGB 13.5 12.9* 12.5* 12.3* 11.0* 10.9* 10.6*  HCT 38.1* 36.1* 36.1* 35.6* 33.2* 32.7* 32.5*  MCV 87.6 84.7 85.1 85.2 88.1 88.1 89.0  PLT 180.0 170 168 171 151 167 161    Basic Metabolic Panel: Recent Labs  Lab 07/04/18 1646  07/07/18 0310 07/07/18 0548 07/07/18 1120 07/07/18 1459  07/08/18 1500 07/08/18 1825 07/08/18 2300 07/09/18 0319 07/09/18 0709  NA 112*   < > 126* 127* 126* 124*   < > 126* 127* 128* 130* 133*  K 4.1   < > 3.7 4.0 3.8 4.1  --   --   --   --  3.3*  --   CL 83*   < > 95* 95* 95* 92*  --   --   --   --  98  --   CO2 21*   < > 23 25 21* 22  --   --   --   --  25  --   GLUCOSE 108*   < >  125* 104* 169* 116*  --   --   --   --  181*  --   BUN 9   < > 7* 6* 5* 6*  --   --   --   --  6*  --   CREATININE 0.73   < > 0.70 0.73 0.84 0.82  --   --   --   --  0.82  --   CALCIUM 8.6*   < > 8.3* 8.4* 8.3* 8.6*  --   --   --   --  8.5*  --   MG 1.7  --   --   --   --   --   --   --   --   --   --   --    < > = values in this interval not displayed.   GFR: Estimated Creatinine Clearance: 97.3 mL/min (by C-G formula based on SCr of 0.82 mg/dL). Recent Labs  Lab 07/06/18 0431 07/07/18 0435 07/08/18 0501 07/09/18 0319  WBC 4.2 3.6* 4.2 3.4*    Liver Function Tests: Recent Labs  Lab 07/04/18 1134 07/09/18 0319  AST 22 17  ALT 27 23  ALKPHOS 108 91  BILITOT 0.5 0.4  PROT 6.4 5.2*  ALBUMIN 4.1 2.8*   Recent Labs  Lab 07/04/18 1134  LIPASE 26.0  AMYLASE 49   No results for input(s): AMMONIA in the last 168 hours.  ABG No results found for: PHART, PCO2ART, PO2ART, HCO3, TCO2, ACIDBASEDEF, O2SAT   Coagulation Profile: Recent Labs  Lab 07/04/18 1646  INR  0.92    Cardiac Enzymes: No results for input(s): CKTOTAL, CKMB, CKMBINDEX, TROPONINI in the last 168 hours.  HbA1C: No results found for: HGBA1C  CBG: Recent Labs  Lab 07/04/18 2051 07/06/18 0235  GLUCAP 96 121*    This patient is critically ill with multiple organ system failure; which, requires frequent high complexity decision making, assessment, support, evaluation, and titration of therapies. This was completed through the application of advanced monitoring technologies and extensive interpretation of multiple databases. During this encounter critical care time was devoted to patient care services described in this note for 33 minutes.   Baltazar Apo, MD, PhD 07/09/2018, 10:51 AM [facility] [phone] or if no answer [phone]

## 2018-07-09 NOTE — Progress Notes (Signed)
Maint completed

## 2018-07-09 NOTE — Progress Notes (Signed)
.  [facility] ADULT ICU REPLACEMENT PROTOCOL FOR AM LAB REPLACEMENT ONLY  The patient does apply for the [facility] Adult ICU Electrolyte Replacment Protocol based on the criteria listed below:   1. Is GFR >/= 40 ml/min? Yes.    Patient's GFR today is >60 2. Is urine output >/= 0.5 ml/kg/hr for the last 6 hours? Yes.   Patient's UOP is .8 ml/kg/hr 3. Is BUN < 60 mg/dL? Yes.    Patient's BUN today is 6 4. Abnormal electrolyte(s): K-3.3 5. Ordered repletion with: per protocol 6. If a panic level lab has been reported, has the CCM MD in charge been notified? Yes.  .   Physician:  Dr. Welton Flakes, Philis Nettle 07/09/2018 6:16 AM

## 2018-07-09 NOTE — Progress Notes (Signed)
PCCM Interval Note  Afternoon eval of patient shows that he is now having more persistent symptoms.  Persistent word finding difficulty, some twitching and clearly a different neurological exam compared with this morning when I saw him.  His sodium is now 135.  There is no evidence to support an infectious encephalitis but certainly he is at risk for either an autoimmune process or paraneoplastic process given his history of malignancy.  I discussed the case with Dr. Cheral Marker with neurology.  We both agree that an MRI brain with contrast is indicated here to look for any evidence of encephalitis.  Also Dr. Cheral Marker has recommended that we go ahead and initiate therapy with high-dose steroids, methylprednisolone 1000 mg daily for 5 days.  I will start this now.  Independent CC time 35 minutes.  Baltazar Apo, MD, PhD 07/09/2018, 6:23 PM [facility] [phone] or if no answer [phone]

## 2018-07-09 NOTE — Progress Notes (Signed)
The patient has had many recurrent episodes of stereotyped eye blinking and mutism but being able to follow commands. All episodes that this RN is aware of were captured on the continuous EEG.

## 2018-07-09 NOTE — Procedures (Signed)
  Video EEG Monitoring Report     Dates of recording: 07/08/2018 @ 16:10 to 07/09/2018 @ 07:30   Recording day: 1    Interpreting physician: Izora Ribas, DO       CPT: [phone]              History: 67 year old patient with episodes of inability to speak. Continuous VEEG requested to evaluate for seizures.  EEG Details: Routine Video EEG was performed using standard setting per the guidelines of American Clinical Neurophysiology Society (ACNS). A minimum of 21 electrodes were placed on scalp according to the International 10-20 or 10-10 system. Supplemental electrodes were placed as needed. Single EKG electrode was also used to detect cardiac arrhythmia. Recording was performed at a sampling rate of at least 256 Hz. Patient's behavior was continuously recorded on video simultaneously with EEG. A minimum of 18 channels were used for data display. Each epoch of study was reviewed manually daily and as needed using standard digital review software allowing for montage reformatting, gain and filter changes on a display system of sufficient resolution to prevent aliasing. Computerized quantitative EEG analysis (such as compressed spectral array analysis, dipole analysis, trending, automated spike & seizure detection) was used as indicated.  Description of EEG features: State of patient: Awake and asleep  Dominant activity: A posterior dominant rhythm of 8 Hz is observed, which is symmetric and reactive to eye closure.   Reactivity to stimulation: Present  Sleep: Drowsiness and sleep were manifested by diffuse irregular slow waves, vertex waves, sleep spindles, and K-complexes.   Nonepileptiform abnormalities: None  Periodic or rhythmic abnormalities: None  Epileptiform discharges: None  Push button events: Yes the event button was pushed multiple times throughout the recording for episodes of being unable to speak. There was no EEG change prior, during, or after to suggest epileptic  seizure.  Impression: This EEG is within normal limits. No epileptiform discharges or EEG seizures were recorded. Multiple patient events were recorded with no EEG change.
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
## 2018-07-10 ENCOUNTER — Inpatient Hospital Stay (HOSPITAL_COMMUNITY): Payer: 59

## 2018-07-10 ENCOUNTER — Ambulatory Visit (INDEPENDENT_AMBULATORY_CARE_PROVIDER_SITE_OTHER): Payer: 59 | Admitting: Orthopedic Surgery

## 2018-07-10 ENCOUNTER — Encounter (HOSPITAL_COMMUNITY): Payer: Self-pay | Admitting: Radiology

## 2018-07-10 DIAGNOSIS — G9340 Encephalopathy, unspecified: Secondary | ICD-10-CM

## 2018-07-10 LAB — SODIUM
Sodium: 137 mmol/L (ref 135–145)
Sodium: 136 mmol/L (ref 135–145)
Sodium: 138 mmol/L (ref 135–145)

## 2018-07-10 LAB — URINALYSIS, ROUTINE W REFLEX MICROSCOPIC
Bilirubin Urine: NEGATIVE
Glucose, UA: 50 mg/dL — AB
Hgb urine dipstick: NEGATIVE
Ketones, ur: NEGATIVE mg/dL
Leukocytes, UA: NEGATIVE
Nitrite: NEGATIVE
Protein, ur: NEGATIVE mg/dL
Specific Gravity, Urine: 1.017 (ref 1.005–1.030)
pH: 6 (ref 5.0–8.0)

## 2018-07-10 LAB — BASIC METABOLIC PANEL
Anion gap: 8 (ref 5–15)
Anion gap: 12 (ref 5–15)
BUN: 7 mg/dL — ABNORMAL LOW (ref 8–23)
BUN: 8 mg/dL (ref 8–23)
CO2: 22 mmol/L (ref 22–32)
CO2: 23 mmol/L (ref 22–32)
Calcium: 8.9 mg/dL (ref 8.9–10.3)
Calcium: 9.4 mg/dL (ref 8.9–10.3)
Chloride: 105 mmol/L (ref 98–111)
Chloride: 101 mmol/L (ref 98–111)
Creatinine, Ser: 0.75 mg/dL (ref 0.61–1.24)
Creatinine, Ser: 0.86 mg/dL (ref 0.61–1.24)
GFR calc Af Amer: >60 mL/min (ref >60)
GFR calc Af Amer: >60 mL/min (ref >60)
GFR calc non Af Amer: >60 mL/min (ref >60)
GFR calc non Af Amer: >60 mL/min (ref >60)
Glucose, Bld: 162 mg/dL — ABNORMAL HIGH (ref 70–99)
Glucose, Bld: 201 mg/dL — ABNORMAL HIGH (ref 70–99)
Potassium: 3.9 mmol/L (ref 3.5–5.1)
Potassium: 3.5 mmol/L (ref 3.5–5.1)
Sodium: 135 mmol/L (ref 135–145)
Sodium: 136 mmol/L (ref 135–145)

## 2018-07-10 LAB — CULTURE, BLOOD (ROUTINE X 2)
Culture: NO GROWTH
Culture: NO GROWTH
Special Requests: ADEQUATE
Special Requests: ADEQUATE

## 2018-07-10 LAB — THYROGLOBULIN ANTIBODY: Thyroglobulin Antibody: <1 IU/mL (ref 0.0–0.9)

## 2018-07-10 LAB — CRYPTOCOCCAL ANTIGEN, CSF: Crypto Ag: NEGATIVE

## 2018-07-10 LAB — THYROID PEROXIDASE ANTIBODY: Thyroperoxidase Ab SerPl-aCnc: 9 IU/mL (ref 0–34)

## 2018-07-10 MED ORDER — GADOBUTROL 1 MMOL/ML IV SOLN
10.0000 mL | Freq: Once | INTRAVENOUS | Status: AC | PRN
  Administered 2018-07-10: 10 mL via INTRAVENOUS

## 2018-07-10 MED ORDER — ALPRAZOLAM 0.5 MG PO TABS
0.5000 mg | ORAL_TABLET | Freq: Once | ORAL | Status: AC
  Administered 2018-07-10: 0.5 mg via ORAL
  Filled 2018-07-10: qty 1

## 2018-07-10 NOTE — Progress Notes (Signed)
eLink Physician-Brief Progress Note Patient Name: Taylor Elliott DOB: [birth year] MRN: [id number]   Date of Service  07/10/2018  HPI/Events of Note  Notified of family's request for monitoring Na closely as well as check for Lyme.  eICU Interventions  Given recent neuro changes will continue Na monitoring tonight.  Will defer workup for Lyme disease to primary MD or neuro service.     Intervention Category Major Interventions: Electrolyte abnormality - evaluation and management  Judd Lien 07/10/2018, 9:22 PM

## 2018-07-10 NOTE — Progress Notes (Signed)
[facility] ROUNDING NOTE   Subjective:   Resting comfortably with no complaints this morning wife states he was a little better last night.  Blood pressure blood pressure 141/80 pulse 98 temperature 97.5 O2 sats 100% room air.  IV Keppra 1 g every 12 hours  IV Solu-Medrol 1 g daily started 07/09/2018  Repeat MRI brain ordered.  MRI 07/06/2018 showed chronic small vessel ischemic changes in the pons no focal cerebellar insult cerebral hemispheres with atrophy focally prominent left parietal gyri.  No mass lesions hemorrhages hydrocephalus subdural collections noted mesial temporal lobes appear symmetric and in the normal limits.  Chest x-ray shows right IJ catheter in SVC  Sodium 137 potassium 3.9 chloride 105 CO2 22 BUN 7 creatinine 0.75 glucose 162 WBC 3.4 hemoglobin 10.6 platelets 157.     Objective:  Vital signs in last 24 hours:  Temp:  [97.5 F (36.4 C)-97.7 F (36.5 C)] 97.7 F (36.5 C) (12/12 0809) Pulse Rate:  [74-108] 90 (12/12 0500) Resp:  [13-28] 16 (12/12 0700) BP: (111-176)/(22-123) 127/68 (12/12 0700) SpO2:  [96 %-100 %] 98 % (12/12 0800)  Weight change:  Filed Weights   07/06/18 0355 07/08/18 0500 07/09/18 0500  Weight: 94.9 kg 94.7 kg 91.4 kg    Intake/Output: I/O last 3 completed shifts: In: 1865.2 [I.V.:1497.2; Other:10; IV Piggyback:358] Out: 950 [Urine:950]   Intake/Output this shift:  Total I/O In: 40 [I.V.:30; Other:10] Out: -   CVS- RRR no murmurs rubs gallops RS- CTA no wheezes rales ABD- BS present soft non-distended EXT-right trans-met to tarsal amputation   Basic Metabolic Panel: Recent Labs  Lab 07/04/18 1646  07/07/18 0548 07/07/18 1120 07/07/18 1459  07/09/18 0319  07/09/18 1024 07/09/18 1243 07/09/18 2000 07/10/18 0241 07/10/18 0700  NA 112*   < > 127* 126* 124*   < > 130*   < > 134* 135 136 135 137  K 4.1   < > 4.0 3.8 4.1  --  3.3*  --   --   --   --  3.9  --   CL 83*   < > 95* 95* 92*  --  98  --    --   --   --  105  --   CO2 21*   < > 25 21* 22  --  25  --   --   --   --  22  --   GLUCOSE 108*   < > 104* 169* 116*  --  181*  --   --   --   --  162*  --   BUN 9   < > 6* 5* 6*  --  6*  --   --   --   --  7*  --   CREATININE 0.73   < > 0.73 0.84 0.82  --  0.82  --   --   --   --  0.75  --   CALCIUM 8.6*   < > 8.4* 8.3* 8.6*  --  8.5*  --   --   --   --  8.9  --   MG 1.7  --   --   --   --   --   --   --   --   --   --   --   --    < > = values in this interval not displayed.    Liver Function Tests: Recent Labs  Lab 07/04/18 1134 07/09/18 0319  AST 22 17  ALT 27 23  ALKPHOS 108 91  BILITOT 0.5 0.4  PROT 6.4 5.2*  ALBUMIN 4.1 2.8*   Recent Labs  Lab 07/04/18 1134  LIPASE 26.0  AMYLASE 49   No results for input(s): AMMONIA in the last 168 hours.  CBC: Recent Labs  Lab 07/04/18 1134 07/04/18 1646 07/05/18 0030 07/06/18 0431 07/07/18 0435 07/08/18 0501 07/09/18 0319  WBC 5.3 5.2 4.1 4.2 3.6* 4.2 3.4*  NEUTROABS 4.4 4.2 3.1 3.2 2.8  --   --   HGB 13.5 12.9* 12.5* 12.3* 11.0* 10.9* 10.6*  HCT 38.1* 36.1* 36.1* 35.6* 33.2* 32.7* 32.5*  MCV 87.6 84.7 85.1 85.2 88.1 88.1 89.0  PLT 180.0 170 168 171 151 167 157    Cardiac Enzymes: No results for input(s): CKTOTAL, CKMB, CKMBINDEX, TROPONINI in the last 168 hours.  BNP: Invalid input(s): POCBNP  CBG: Recent Labs  Lab 07/04/18 2051 07/06/18 0235 07/09/18 2015  GLUCAP 96 121* 103*    Microbiology: Results for orders placed or performed during the hospital encounter of 07/04/18  CSF culture     Status: None   Collection Time: 07/04/18  9:49 PM  Result Value Ref Range Status   Specimen Description CSF  Final   Special Requests NONE  Final   Gram Stain NO WBC SEEN NO ORGANISMS SEEN   Final   Culture   Final    NO GROWTH 3 DAYS Performed at [facility], [place] [address] [place], [place] [address]    Report Status 07/08/2018 FINAL  Final  MRSA PCR Screening     Status: None   Collection Time:  07/04/18 11:54 PM  Result Value Ref Range Status   MRSA by PCR NEGATIVE NEGATIVE Final    Comment:        The GeneXpert MRSA Assay (FDA approved for NASAL specimens only), is one component of a comprehensive MRSA colonization surveillance program. It is not intended to diagnose MRSA infection nor to guide or monitor treatment for MRSA infections. Performed at [facility], [place] [address], [place], [place] [address]   Culture, blood (routine x 2)     Status: None (Preliminary result)   Collection Time: 07/05/18 12:30 AM  Result Value Ref Range Status   Specimen Description BLOOD RIGHT ANTECUBITAL  Final   Special Requests   Final    BOTTLES DRAWN AEROBIC ONLY Blood Culture adequate volume   Culture   Final    NO GROWTH 4 DAYS Performed at [facility], [place] [address], [place], [place] [address]    Report Status PENDING  Incomplete  Culture, blood (routine x 2)     Status: None (Preliminary result)   Collection Time: 07/05/18 12:36 AM  Result Value Ref Range Status   Specimen Description BLOOD LEFT ANTECUBITAL  Final   Special Requests   Final    BOTTLES DRAWN AEROBIC ONLY Blood Culture adequate volume   Culture   Final    NO GROWTH 4 DAYS Performed at [facility], [place] [address] [place], [place] [address]    Report Status PENDING  Incomplete    Coagulation Studies: No results for input(s): LABPROT, INR in the last 72 hours.  Urinalysis: No results for input(s): COLORURINE, LABSPEC, PHURINE, GLUCOSEU, HGBUR, BILIRUBINUR, KETONESUR, PROTEINUR, UROBILINOGEN, NITRITE, LEUKOCYTESUR in the last 72 hours.  Invalid input(s): APPERANCEUR    Imaging: No results found.   Medications:   . levETIRAcetam Stopped (07/10/18 0459)  . methylPREDNISolone (  SOLU-MEDROL) injection Stopped (07/09/18 2129)  . sodium chloride (hypertonic) 30 mL/hr at 07/10/18 0500   . allopurinol  200 mg Oral q morning - 10a  . amLODipine  10 mg Oral q morning - 10a  . aspirin EC   81 mg Oral Daily  . clonazePAM  0.5 mg Oral BID  . diclofenac sodium  2 g Topical QID  . enoxaparin (LOVENOX) injection  40 mg Subcutaneous Q24H  . fenofibrate  160 mg Oral Daily  . lisinopril  20 mg Oral Daily  . nicotine  21 mg Transdermal Daily  . rosuvastatin  10 mg Oral q1800  . traZODone  100 mg Oral QHS   acetaminophen, ondansetron (ZOFRAN) IV, oxyCODONE  Assessment/ Plan:   Hyponatremia.  This could have been related to hypo-volemic hyponatremia on admission.  There was response to IV saline.  Transition to 3% normal saline would make the diagnosis difficult.  Could be an element of SIADH in the setting of encephalopathy.  The etiology of the encephalopathy appears elusive.  Certainly electrolyte abnormalities could be responsible however with return of serum sodium I think this is unlikely.  Now receiving high-dose steroids.  3% saline has been reduced to 30 cc/h.  Once patient is eating and drinking is reasonable to stop this.  Encephalopathy.  Appreciate neurology help Dr. Cheral Marker.  Patient is receiving 1 g of methylprednisolone daily.  Etiological factors could involve vasculitis, infections that could be spirochetal, protozoal, viral, or bacteria, paraneoplastic syndrome, idiopathic.  Will send ANCA, ANA.   LOS: [place] '@TODAY''@9'$ :17 AM

## 2018-07-10 NOTE — Progress Notes (Signed)
eLink Physician-Brief Progress Note Patient Name: Taylor Elliott DOB: May 16, 1951 MRN: [id number]   Date of Service  07/10/2018  HPI/Events of Note  Hyponatremia on hypertonic saline.  Serum Na 136-->135  eICU Interventions  Discussed with the pharmacist to keep at 30cc/hr.  Serum Na changed from 136 to 135 on lower rate of hypertonic saline.  Continue to follow.      Intervention Category Major Interventions: Electrolyte abnormality - evaluation and management  Elsie Lincoln 07/10/2018, 3:45 AM

## 2018-07-10 NOTE — Progress Notes (Signed)
This is the fourth attempt since 12/12 at 3:30am to complete MRI exam. First attempt at 3:30am patient was asleep, second attempt at 8am RN could not come down, third attempt at 9:45am family stated sedation was needed RN called MD to obtain an order, and fourth attempt at 10:45am RN not available to come down.

## 2018-07-10 NOTE — Progress Notes (Signed)
Physical Therapy Treatment Patient Details Name: Taylor Elliott MRN: [id number] DOB: Sep 02, 1950 Today's Date: 07/10/2018    History of Present Illness Pt transferred [place] [facility] for higher level mgt of hyponatremia on 12/07 after week bout of worsening vomiting and diarrhea. Pt also with seizure/stroke like symptoms with further workup being done at time of PT evaluation 07/09/2018. PMH includes: prostate and kidney CA, peripheral neuropathy of BLE, s/p L trans met amputation, HLD, HTN.     PT Comments    Patient progressing well with therapy this afternoon, ambulating out into hallway with min guard and use of RW, at times min A to provide stability. Continues to remain highly emotional in tears during most of session, "I need to get stronger, I am so fortunate to be walking today". Tremors and stuttering continue but as of 1500 today appear to be less severe than prior PT visit. Wife present and helpful in supporting husband. MRI transport arrived, unable to go through bed level therex, patient eager to learn some as he reports he feels himself getting weaker.   HR 110-125 during visit SpO2 WNL on RA   Follow Up Recommendations  Home health PT;Supervision/Assistance - 24 hour(pending progress)     Equipment Recommendations  (TBD)    Recommendations for Other Services OT consult     Precautions / Restrictions Precautions Precautions: Fall Restrictions Weight Bearing Restrictions: No LLE Weight Bearing: Weight bearing as tolerated    Mobility  Bed Mobility Overal bed mobility: Modified Independent                Transfers Overall transfer level: Needs assistance Equipment used: Rolling walker (2 wheeled) Transfers: Sit to/from Stand Sit to Stand: Min guard         General transfer comment: Min guard to stand   Ambulation/Gait Ambulation/Gait assistance: Min guard;Min assist Gait Distance (Feet): 80 Feet Assistive device: Rolling walker (2  wheeled) Gait Pattern/deviations: Step-to pattern;Step-through pattern Gait velocity: decreased   General Gait Details: Patient ambulating with noticble tremer in all 4 extremities, however no overt LOB, grasping RW extremly tight cues for release pressure. walking with AFO. HRmax 120, SpO2 WNL on RA   Stairs             Wheelchair Mobility    Modified Rankin (Stroke Patients Only)       Balance Overall balance assessment: Needs assistance   Sitting balance-Leahy Scale: Fair       Standing balance-Leahy Scale: Poor                              Cognition Arousal/Alertness: Awake/alert Behavior During Therapy: WFL for tasks assessed/performed                                   General Comments: emotional during visit.       Exercises      General Comments        Pertinent Vitals/Pain Pain Assessment: No/denies pain    Home Living                      Prior Function            PT Goals (current goals can now be found in the care plan section) Acute Rehab PT Goals Patient Stated Goal: walk again, figure out what is going on with his  speech and cognition  PT Goal Formulation: With patient/family Time For Goal Achievement: 07/23/18 Potential to Achieve Goals: Fair Progress towards PT goals: Progressing toward goals    Frequency    Min 3X/week      PT Plan Current plan remains appropriate    Co-evaluation              AM-PAC PT "6 Clicks" Mobility   Outcome Measure  Help needed turning from your back to your side while in a flat bed without using bedrails?: A Little Help needed moving from lying on your back to sitting on the side of a flat bed without using bedrails?: A Little Help needed moving to and from a bed to a chair (including a wheelchair)?: A Little Help needed standing up from a chair using your arms (e.g., wheelchair or bedside chair)?: A Little Help needed to walk in hospital room?: A  Lot Help needed climbing 3-5 steps with a railing? : A Lot 6 Click Score: 16    End of Session Equipment Utilized During Treatment: Gait belt Activity Tolerance: Patient tolerated treatment well Patient left: in bed;with call bell/phone within reach;with family/visitor present;with bed alarm set Nurse Communication: Mobility status PT Visit Diagnosis: Unsteadiness on feet (R26.81)     Time: 6734-1937 PT Time Calculation (min) (ACUTE ONLY): 50 min  Charges:  $Gait Training: 23-37 mins $Therapeutic Activity: 8-22 mins                     Reinaldo Berber, PT, DPT Acute Rehabilitation Services Pager: [phone] Office: [place] 07/10/2018, 5:12 PM

## 2018-07-10 NOTE — Progress Notes (Signed)
Family concerned about increased expressive aphasia and has concerns about the next sodium lab to be drawn 12/13 am. Dr. Leonel Ramsay notified of increased expressive aphasia and requested sodium level. Verbal order from Dr. Leonel Ramsay placed for [facility] [place] and level drawn. Will continue to monitor closely.

## 2018-07-10 NOTE — Progress Notes (Signed)
Pt wife reports to this rn pt voiced an episode of "hallucinating". Pt states being in a wife and this rn were present but that he was in a different room and a different bed. Pt unable to clarify whether he felt vs visualized being in a different bed/room.

## 2018-07-10 NOTE — Progress Notes (Signed)
Pt transported to MRI 

## 2018-07-10 NOTE — Progress Notes (Signed)
Family concerned about sodium level  decreased from 138 at 1500 to 136 at 1930. Additionally family would like to check for lyme disease. [facility], RN at e link notified and Dr. Genevive Bi camera'd in the room to speak to patient and family. Per MD, defer lyme disease test until day shift and orders for sodium level at 0000 placed. Will continue to monitor closely.

## 2018-07-10 NOTE — Progress Notes (Addendum)
NEUROLOGY PROGRESS NOTE  Subjective: At present time patient is extremely upset that he does not know the medications he is on.  His wife clearly is able to repeat the medications he is on however he is not listening to her during this time.  He is more focused on the point that no one is repeating every medication he is taking on a regular basis.  He states that he was given clonazepam but did not know this.  His wife states that she did tell him.  Multiple times it was explained to him what clonazepam does and how it works.  At that point he stated "do not you think that I should have a say in what I am taking"- wife, nurse, including myself explained to the patient that if he wants to he can refuse the medication.  His wife takes detailed notes.  Although, she is trying very hard to explain things to him he is very oppositional and will not pay attention to her.  It is clearly noted that patient is waxing and waning with his speech, in a distractable fashion.  At times he can clearly state what he is feeling, what he wants, and his frustrations with medications.  He does not seem concerned about what tests have been taken, or even the results.  His wife clearly knows what tests have been obtained, how long each test will take to get back, and why each test has been taken.  Again she tries to explain this to him but he does not appear to be interested.  Wife states that there were multiple episodes of facial twitching and word finding difficulties overnight.  Exam: Vitals:   07/10/18 0800 07/10/18 0809  BP: (!) 141/80   Pulse:    Resp:    Temp:  97.7 F (36.5 C)  SpO2: 98%     Physical Exam  HEENT-  Normocephalic, no lesions, without obvious abnormality.  Normal external eye and conjunctiva.   Extremities- Warm, dry and intact Musculoskeletal-no joint tenderness, deformity or swelling Skin-warm and dry, no hyperpigmentation, vitiligo, or suspicious lesions   Neuro:  Mental Status: Alert,  oriented, thought content appropriate.  Speech fluent at times and waxing and waning.  He is apparently extremely angry that he was placed on clonazepam.  At times he will speak clearly however the more apparently angry and agitated he becomes, the more he will stutter his words or not speak with full words.  It should be noted that when he is more goal directed in behavior, looks at the examiner and talks specifically about a topic that he appears to want to get a specific answer to, the stuttering stops and his speech is completely fluent.  There is no evidence of a classic lesional receptive or expressive aphasia when stuttering, as there does not appear to be any word finding difficulty, with intact syntax and grammar. He is able to follow 3 step commands without difficulty. Cranial Nerves: II:  Visual fields grossly normal in the context of poor cooperation with exam  III,IV, VI: ptosis not present, extra-ocular motions intact bilaterally, PERRL V,VII: smile symmetric- I was in the room for approximately 15 minutes and did not notice any facial twitching.  Facial light touch sensation normal bilaterally VIII: hearing normal bilaterally IX,X: uvula midline XI: bilateral shoulder shrug XII: midline tongue extension Motor: Right : Upper extremity   5/5    Left:     Upper extremity   5/5  Lower extremity  5/5     Lower extremity   5/5 Tone and bulk:normal tone throughout; no atrophy noted Sensory: Pinprick and light touch intact throughout, bilaterally Deep Tendon Reflexes: 2+ and symmetric throughout- it was notable that a few times before the tendon was struck, he would jerk his leg. Plantars: Right: downgoing   Left: downgoing Cerebellar: Shows what appears most consistent with an exaggerated dysmetria which appears nonphysiological.  With heel-to-shin he does not show any abnormalities.     Medications:  Scheduled: . allopurinol  200 mg Oral q morning - 10a  . amLODipine  10 mg Oral q  morning - 10a  . aspirin EC  81 mg Oral Daily  . clonazePAM  0.5 mg Oral BID  . diclofenac sodium  2 g Topical QID  . enoxaparin (LOVENOX) injection  40 mg Subcutaneous Q24H  . fenofibrate  160 mg Oral Daily  . lisinopril  20 mg Oral Daily  . nicotine  21 mg Transdermal Daily  . rosuvastatin  10 mg Oral q1800  . traZODone  100 mg Oral QHS    Pertinent Labs/Diagnostics: Speech Clinical Impression  Pt's oropharyngeal swallow appeared to be grossly functional during PO trials until consumption of pills with water, after which he had a delayed cough on 2 out of 2 trials (taking multiple pills at once).   -Sodium is 137 -BUN 7 -Glucose 162 -Thyroglobulin antibody within normal limits -Thyroid peroxidase within normal limits   Etta Quill PA-C [facility] [phone]  EEG 12/11: This EEG is within normal limits. No epileptiform discharges or EEG seizures were recorded. Multiple patient events were recorded with no EEG change.   Impression - No significant change in impression from yesterday:  67 year old male with multiplebriefepisodes of expressive aphasia. Also with an episode of frank seizure activity following the initial Neurology consult on 12/8, consisting ofhead version to the left with left gaze deviation and left upper extremity tonic-clonic jerking lasting approximately 2 minutes which resolved with Ativan. He was started on Keppra at that time. Overnight on 12/9-10 he had a recurrent spell of word finding difficulty after havingapproximately [place] he becomes very frustrated with trying to get his words out.Overnight 12/10-11 he had additional spells that were captured with no electrographic correlate on LTM EEG.  1. Overall presentation most consistent with an initial provoked seizure in the setting of hyponatremia, followed by either recurrent partial complex seizures or, more likely, pseudoseizures.  2. MRI brain shows probable left  parietal arachnoid cyst. Arachnoid cysts are almost always asymptomatic and the finding on this scan is felt unlikely to be exerting mass effect sufficient to result in a structural cortical abnormality or reduced seizure threshold in the adjacent cerebral cortex.  3. Initial EEG on 12/8 revealed triphasic waves with no electrographic seizures. RepeatEEGfrom 12/10/2019showed no epileptiform activity after Kepprawasincreased to 750 mgBID. 4. Has had florid recurrence of what appears most likely to be pseudoseizure activity and psychogenic speech deficit with odd stuttering pattern admixed with frequent pejorative statements.   . Recommendations: 1.Continue Keppra 2. Continue to keep sodium within normal limits 3.May need a psychology evaluation as EEG showed no electrographic correlate for his recurrent speech deficits.  5.  Continue clonazepam for anxiety. He is receiving IV Ativan x 1 now.  6. Per [facility] statutes, patients with seizures are not allowed to drive until they have been seizure-free for six months. Use caution when using heavy equipment or power tools. Avoid working on ladders or at  heights. Take showers instead of baths. Ensure the water temperature is not too high on the home water heater. Do not go swimming alone. When caring for infants or small children, sit down when holding, feeding, or changing them to minimize risk of injury to the child in the event you have a seizure. Also, Maintain good sleep hygiene. Avoid alcohol. 7. Additional labs added to CSF sample sent from OSH to look for possible autoimmune encephalopathy. An NMDA antibody titer has been ordered for the remaining CSF sample and also will be run on serum. Serum thyroglobulin and microsomal antibody levels have also been ordered. Serum LGI-1 antibody to assess for possible anti-LGI-1 autoimmune encephalitis with hyponatremia (can cause SIADH along with the encephalitis) has been ordered as a [facility]  sendout. Serum anti-VGKC antibody level also has been ordered as this also can result in an autoimmune encephalitis with hyponatremia.   Of note:-- CSF culture shows no growth x 3 days 8- Nephrology consult to further assess underlying etiologies for the patient's hyponatremia, including SIADH  Electronically signed: Dr. Kerney Elbe 07/10/2018, 9:44 AM
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
## 2018-07-10 NOTE — Progress Notes (Addendum)
NAME:  Taylor Elliott, MRN:  [id number], DOB:  1951-06-30, LOS: 6 ADMISSION DATE:  07/04/2018, CONSULTATION DATE:  07/05/2018 REFERRING MD:  CHIEF COMPLAINT:  Hyponatremia  Brief History / History of Present Illness   Mr. Otterness is a 67 YO gentleman who has been transferred to ICU from [place] ED for higher level management of hyponatremia.  Patient states that he was in his USOH until about a week ago. He stated that he was in [place] for thanksgiving and that night when he got back home he developed acute onset vomiting and diarrhea. He states that he had multiple bouts of vomiting and diarrhea that lasted for about 2 days. He took imodium and his symptoms resolved. During the week he developed congestion and he obtained over the counter decongestants with some improvement. He denies fever or chills. He however states that he had had no appetite and therefore has not been eating. He states that he has been drinking gatorade and some water. He endorses occasional cough productive of clear sputum. He denies hemoptysis. He denies chest pain. He denies any sick contacts. He states that last night diarrhea recurred without vomiting. He had 2 bouts then took imodium. He thus decided to contact his PCP. He was seen by his PCP and had CXR and abdominal Xray that were reported as normal. He had lab works done that revealed hyponatremia with sodium of 113. He was advised to present emergently to the ER. He denies any seizures or dizziness. He states that he has had gait disturbances and imbalance for several years due to his peripheral neuropathy. He states that his imbalance has been progressively worsening over the past three months.   At the OSH ED he was found to have a sodium of 112., unremarkable CBC. He had a CT head done that showed no acute intracranial abnormality An LP was performed that showed elevated protein with normal cell count.    Past Medical History  -Right renal carcinoma status post  nephrectomy -Essential hypertension -Hyperlipidemia -Prostate cancer status post prostatectomy now with urinary incontinence -peripheral neuropathy of bilateral lower extremities -Status post Left trans metatarsal amputation  Significant Hospital Events   ICU admission 07/05/2018 12/8 - Seizure like episode, CT head negative, stat MRI/EEG - pending   Consults:  Critical care  Procedures:  Peripheral IVs  12/8 CT Head - Negative  12/8 MRI Brain - pending  12/8 EEG - pending   Significant Diagnostic Tests:  Serum sodium 112  Micro Data:  NGTD   Antimicrobials:  None   Subjective:  Sodium has continued to trend up on 3% saline, most recent value 137 Patient has continued to have waxing and waning episodic expressive aphasia, facial twitching and difficulty with word finding.   He has not had his MRI brain yet, I am told that he believes he will need some low-dose sedation in order to complete.  Objective   Blood pressure (!) 141/80, pulse 90, temperature 98.4 F (36.9 C), temperature source Oral, resp. rate 16, height 5\' 8"  (1.727 m), weight 91.4 kg, SpO2 98 %.        Intake/Output Summary (Last 24 hours) at 07/10/2018 1136 Last data filed at 07/10/2018 1108 Gross per 24 hour  Intake 1028.81 ml  Output 950 ml  Net 78.81 ml   Filed Weights   07/06/18 0355 07/08/18 0500 07/09/18 0500  Weight: 94.9 kg 94.7 kg 91.4 kg    Examination: General appearance: Awake, in no distress but clearly frustrated  Eyes: Pupils equal and react HENT: Oropharynx moist, no oral lesions Neck: No JVD, no stridor Lungs: Clear bilaterally, no wheezes, no crackles CV: Irregular, no murmurs Abdomen: Soft, nondistended with positive bowel sounds Extremities: No significant edema.  He does have Skin: No rash Psych: Pressured affect, clearly frustrated, repeats himself Neuro: Some facial twitching noted, difficulty with word finding, good strength everywhere, does redirect and follow  commands.    Assessment & Plan:   Hyponatremia with initial seizure/strokelike symptoms at presentation, no evidence CVA by MRI 12/8.  Initially presented with asymptomatic hyponatremia.  Etiology unclear, mixed picture but may been component of SIADH.  Again etiology of this uncertain but can happen in association with encephalitis.  Sodium now 137 Plan to stop 3% saline after the next sodium assuming it stable.  Follow for evidence of any dropping which would suggest SIADH.  Persistent, waxing/waning word finding difficulties and twitching, under evaluation by neurology. Findings and presentation certainly could have been related to hyponatremia.  The persistent symptoms are atypical and less easily explained.  Some question as to whether some of this represents pseudoseizure or psychogenic finding since no seizure activity seen during EEG monitoring during episodes. Keppra has been increased to 1000 mg every 12 hours Viral studies from his LP apparently were never done NMDA antibody was ordered on CSF but there was none remaining for the test.  Unclear as to whether getting this information would make a repeat LP or performing. Serum thyroglobulin, microsomal antibody levels, LG I-1 antibody, VG KC antibody are all ordered and pending to evaluate for possible autoimmune encephalitis.  In the meantime based on discussions with neurology empiric high-dose steroids have been ordered, currently day 2 Repeat MRI brain ordered and pending  Hypertension On amlodipine, lisinopril  Depression/Anxiety No plans to restart Paxil or SSRI given potential connection with hyponatremia  Hyperlipidemia Statin as ordered  Gait disturbance Imbalance Peripheral neuropathy PT/OT to assist with ambulation  Right rotator cuff injury Appreciate Dr. Jess Barters evaluation in the hospital Voltaren gel was recommended and we will set up follow-up in the outpatient setting  Tobacco abuse Nicotine patch, smoking  cessation   Best practice:  Diet: Regular diet Pain/Anxiety/Delirium protocol (if indicated): Not indicated VAP protocol (if indicated):Not indicated  DVT prophylaxis: Enoxaparin GI prophylaxis: Not indicated  Glucose control: Maintain euglycemia Mobility: PT and OT to evaluate and treat Code Status: Discussed code status with the patient and he is full code Family Communication: All issues and plans discussed with the patient and his wife 12/12  Labs   CBC: Recent Labs  Lab 07/04/18 1134 07/04/18 1646 07/05/18 0030 07/06/18 0431 07/07/18 0435 07/08/18 0501 07/09/18 0319  WBC 5.3 5.2 4.1 4.2 3.6* 4.2 3.4*  NEUTROABS 4.4 4.2 3.1 3.2 2.8  --   --   HGB 13.5 12.9* 12.5* 12.3* 11.0* 10.9* 10.6*  HCT 38.1* 36.1* 36.1* 35.6* 33.2* 32.7* 32.5*  MCV 87.6 84.7 85.1 85.2 88.1 88.1 89.0  PLT 180.0 170 168 171 151 167 664    Basic Metabolic Panel: Recent Labs  Lab 07/04/18 1646  07/07/18 0548 07/07/18 1120 07/07/18 1459  07/09/18 0319  07/09/18 1024 07/09/18 1243 07/09/18 2000 07/10/18 0241 07/10/18 0700  NA 112*   < > 127* 126* 124*   < > 130*   < > 134* 135 136 135 137  K 4.1   < > 4.0 3.8 4.1  --  3.3*  --   --   --   --  3.9  --  CL 83*   < > 95* 95* 92*  --  98  --   --   --   --  105  --   CO2 21*   < > 25 21* 22  --  25  --   --   --   --  22  --   GLUCOSE 108*   < > 104* 169* 116*  --  181*  --   --   --   --  162*  --   BUN 9   < > 6* 5* 6*  --  6*  --   --   --   --  7*  --   CREATININE 0.73   < > 0.73 0.84 0.82  --  0.82  --   --   --   --  0.75  --   CALCIUM 8.6*   < > 8.4* 8.3* 8.6*  --  8.5*  --   --   --   --  8.9  --   MG 1.7  --   --   --   --   --   --   --   --   --   --   --   --    < > = values in this interval not displayed.   GFR: Estimated Creatinine Clearance: 99.7 mL/min (by C-G formula based on SCr of 0.75 mg/dL). Recent Labs  Lab 07/06/18 0431 07/07/18 0435 07/08/18 0501 07/09/18 0319  WBC 4.2 3.6* 4.2 3.4*    Liver Function  Tests: Recent Labs  Lab 07/04/18 1134 07/09/18 0319  AST 22 17  ALT 27 23  ALKPHOS 108 91  BILITOT 0.5 0.4  PROT 6.4 5.2*  ALBUMIN 4.1 2.8*   Recent Labs  Lab 07/04/18 1134  LIPASE 26.0  AMYLASE 49   No results for input(s): AMMONIA in the last 168 hours.  ABG No results found for: PHART, PCO2ART, PO2ART, HCO3, TCO2, ACIDBASEDEF, O2SAT   Coagulation Profile: Recent Labs  Lab 07/04/18 1646  INR 0.92    Cardiac Enzymes: No results for input(s): CKTOTAL, CKMB, CKMBINDEX, TROPONINI in the last 168 hours.  HbA1C: No results found for: HGBA1C  CBG: Recent Labs  Lab 07/04/18 2051 07/06/18 0235 07/09/18 2015  GLUCAP 96 121* 103*    This patient is critically ill with multiple organ system failure; which, requires frequent high complexity decision making, assessment, support, evaluation, and titration of therapies. This was completed through the application of advanced monitoring technologies and extensive interpretation of multiple databases. During this encounter critical care time was devoted to patient care services described in this note for 32 minutes.   Baltazar Apo, MD, PhD 07/10/2018, 11:36 AM [facility] [phone] or if no answer [phone]

## 2018-07-10 NOTE — Progress Notes (Signed)
Physical Therapy Treatment Patient Details Name: Taylor Elliott MRN: [id number] DOB: [birth year] Today's Date: 07/10/2018    History of Present Illness Pt transferred from [place] for higher level mgt of hyponatremia on 12/07 after week bout of worsening vomiting and diarrhea. Pt also with seizure/stroke like symptoms with further workup being done at time of PT evaluation 07/09/2018. PMH includes: prostate and kidney CA, peripheral neuropathy of BLE, s/p L trans met amputation, HLD, HTN.     PT Comments    Re-visited patient this evening to go over therex. Patient performing all exercises with good technique, given slight sedation for MRI may benefit from reinforcement next session.   Follow Up Recommendations  Home health PT;Supervision/Assistance - 24 hour(pending progress)     Equipment Recommendations  (TBD)    Recommendations for Other Services OT consult     Precautions / Restrictions Precautions Precautions: Fall Restrictions Weight Bearing Restrictions: No LLE Weight Bearing: Weight bearing as tolerated    Mobility  Bed Mobility Overal bed mobility: Modified Independent                Transfers Overall transfer level: Needs assistance Equipment used: Rolling walker (2 wheeled) Transfers: Sit to/from Stand Sit to Stand: Min guard         General transfer comment: Min guard to stand   Ambulation/Gait Ambulation/Gait assistance: Min guard;Min assist Gait Distance (Feet): 80 Feet Assistive device: Rolling walker (2 wheeled) Gait Pattern/deviations: Step-to pattern;Step-through pattern Gait velocity: decreased   General Gait Details: Patient ambulating with noticble tremer in all 4 extremities, however no overt LOB, grasping RW extremly tight cues for release pressure. walking with AFO. HRmax 120, SpO2 WNL on RA   Stairs             Wheelchair Mobility    Modified Rankin (Stroke Patients Only)       Balance Overall balance  assessment: Needs assistance   Sitting balance-Leahy Scale: Fair       Standing balance-Leahy Scale: Poor                              Cognition Arousal/Alertness: Awake/alert Behavior During Therapy: WFL for tasks assessed/performed                                   General Comments: emotional during visit.       Exercises General Exercises - Lower Extremity Quad Sets: AROM;Both;10 reps Short Arc Quad: AROM;Both;10 reps Heel Slides: AROM;Both;10 reps Hip ABduction/ADduction: AROM;10 reps;Both Straight Leg Raises: AAROM;Both;10 reps    General Comments        Pertinent Vitals/Pain Pain Assessment: No/denies pain    Home Living                      Prior Function            PT Goals (current goals can now be found in the care plan section) Acute Rehab PT Goals Patient Stated Goal: walk again, figure out what is going on with his speech and cognition  PT Goal Formulation: With patient/family Time For Goal Achievement: 07/23/18 Potential to Achieve Goals: Fair Progress towards PT goals: Progressing toward goals    Frequency    Min 3X/week      PT Plan Current plan remains appropriate    Co-evaluation  AM-PAC PT "6 Clicks" Mobility   Outcome Measure  Help needed turning from your back to your side while in a flat bed without using bedrails?: A Little Help needed moving from lying on your back to sitting on the side of a flat bed without using bedrails?: A Little Help needed moving to and from a bed to a chair (including a wheelchair)?: A Little Help needed standing up from a chair using your arms (e.g., wheelchair or bedside chair)?: A Little Help needed to walk in hospital room?: A Lot Help needed climbing 3-5 steps with a railing? : A Lot 6 Click Score: 16    End of Session Equipment Utilized During Treatment: Gait belt Activity Tolerance: Patient tolerated treatment well Patient left: in  bed;with call bell/phone within reach;with family/visitor present;with bed alarm set Nurse Communication: Mobility status PT Visit Diagnosis: Unsteadiness on feet (R26.81)     Time: 8590-9311 PT Time Calculation (min) (ACUTE ONLY): 18 min  Charges:   $Therapeutic Exercise: 8-22 mins                     Reinaldo Berber, PT, DPT Acute Rehabilitation Services Pager: [phone] Office: [place] 07/10/2018, 6:11 PM

## 2018-07-11 LAB — BASIC METABOLIC PANEL
Anion gap: 11 (ref 5–15)
Anion gap: 10 (ref 5–15)
BUN: 11 mg/dL (ref 8–23)
BUN: 13 mg/dL (ref 8–23)
CO2: 25 mmol/L (ref 22–32)
CO2: 26 mmol/L (ref 22–32)
Calcium: 9.3 mg/dL (ref 8.9–10.3)
Calcium: 9.5 mg/dL (ref 8.9–10.3)
Chloride: 101 mmol/L (ref 98–111)
Chloride: 100 mmol/L (ref 98–111)
Creatinine, Ser: 0.98 mg/dL (ref 0.61–1.24)
Creatinine, Ser: 0.85 mg/dL (ref 0.61–1.24)
GFR calc Af Amer: >60 mL/min (ref >60)
GFR calc Af Amer: >60 mL/min (ref >60)
GFR calc non Af Amer: >60 mL/min (ref >60)
GFR calc non Af Amer: >60 mL/min (ref >60)
Glucose, Bld: 194 mg/dL — ABNORMAL HIGH (ref 70–99)
Glucose, Bld: 172 mg/dL — ABNORMAL HIGH (ref 70–99)
Potassium: 3.4 mmol/L — ABNORMAL LOW (ref 3.5–5.1)
Potassium: 3.4 mmol/L — ABNORMAL LOW (ref 3.5–5.1)
Sodium: 137 mmol/L (ref 135–145)
Sodium: 136 mmol/L (ref 135–145)

## 2018-07-11 LAB — VDRL, CSF: VDRL Quant, CSF: NONREACTIVE

## 2018-07-11 LAB — CBC
HCT: 32.4 % — ABNORMAL LOW (ref 39.0–52.0)
Hemoglobin: 10.5 g/dL — ABNORMAL LOW (ref 13.0–17.0)
MCH: 28.9 pg (ref 26.0–34.0)
MCHC: 32.4 g/dL (ref 30.0–36.0)
MCV: 89.3 fL (ref 80.0–100.0)
Platelets: 201 10*3/uL (ref 150–400)
RBC: 3.63 MIL/uL — ABNORMAL LOW (ref 4.22–5.81)
RDW: 13 % (ref 11.5–15.5)
WBC: 7.3 10*3/uL (ref 4.0–10.5)
nRBC: 0 % (ref 0.0–0.2)

## 2018-07-11 LAB — LUPUS ANTICOAGULANT PANEL
DRVVT: 31.4 s (ref 0.0–47.0)
PTT Lupus Anticoagulant: 40.4 s (ref 0.0–51.9)

## 2018-07-11 LAB — SODIUM: Sodium: 136 mmol/L (ref 135–145)

## 2018-07-11 LAB — ANTINUCLEAR ANTIBODIES, IFA: ANA Ab, IFA: NEGATIVE

## 2018-07-11 LAB — SODIUM, URINE, RANDOM: Sodium, Ur: 130 mmol/L

## 2018-07-11 LAB — THYROID PEROXIDASE ANTIBODY: Thyroperoxidase Ab SerPl-aCnc: 8 IU/mL (ref 0–34)

## 2018-07-11 LAB — THYROGLOBULIN ANTIBODY: Thyroglobulin Antibody: <1 IU/mL (ref 0.0–0.9)

## 2018-07-11 LAB — OSMOLALITY, URINE: Osmolality, Ur: 773 mOsm/kg (ref 300–900)

## 2018-07-11 MED ORDER — DOCUSATE SODIUM 100 MG PO CAPS
100.0000 mg | ORAL_CAPSULE | Freq: Every day | ORAL | Status: DC
  Administered 2018-07-11 – 2018-07-16 (×6): 100 mg via ORAL
  Filled 2018-07-11 (×7): qty 1

## 2018-07-11 MED ORDER — ALPRAZOLAM 0.5 MG PO TABS
0.5000 mg | ORAL_TABLET | Freq: Three times a day (TID) | ORAL | Status: DC | PRN
  Administered 2018-07-11 – 2018-07-16 (×11): 0.5 mg via ORAL
  Filled 2018-07-11 (×12): qty 1

## 2018-07-11 MED ORDER — POTASSIUM CHLORIDE CRYS ER 20 MEQ PO TBCR
20.0000 meq | EXTENDED_RELEASE_TABLET | ORAL | Status: AC
  Administered 2018-07-11 (×2): 20 meq via ORAL
  Filled 2018-07-11 (×2): qty 1

## 2018-07-11 NOTE — Progress Notes (Signed)
[facility] ADULT ICU REPLACEMENT PROTOCOL FOR AM LAB REPLACEMENT ONLY  The patient does apply for the [facility] Adult ICU Electrolyte Replacment Protocol based on the criteria listed below:   1. Is GFR >/= 40 ml/min? Yes.    Patient's GFR today is >60 2. Is urine output >/= 0.5 ml/kg/hr for the last 6 hours? Yes.   Patient's UOP is 1.2 ml/kg/hr 3. Is BUN < 60 mg/dL? Yes.    Patient's BUN today is 13 4. Abnormal electrolyte(s): K-3.4 5. Ordered repletion with: per protocol 6. If a panic level lab has been reported, has the CCM MD in charge been notified? Yes.  .   Physician:  Dr. Wilson Singer, [place]is [place] 07/11/2018 6:09 AM

## 2018-07-11 NOTE — Care Management Note (Addendum)
Case Management Note  Patient Details  Name: Taylor Elliott MRN: [id number] Date of Birth: 12/27/1950  Subjective/Objective:     Pt admitted hypokalemia                 Action/Plan:  PTA Independent from home with wife - lately needing to use walker for mobility assitance.  Pt has PCP and denied barriers with paying for medications.  Pt interested in [facility], CM provided medicare.gov HH and left it with pt and wife - CM will follow up at a later time.  CM also provided copy of [place] list on shadow chart   Expected Discharge Date:                  Expected Discharge Plan:  Home/Self Care(from home)  In-House Referral:     Discharge planning Services  CM Consult  Post Acute Care Choice:    Choice offered to:     DME Arranged:    DME Agency:     HH Arranged:    HH Agency:     Status of Service:     If discussed at [employer], dates discussed:    Additional Comments:  Taylor Labrador, RN 07/11/2018, 11:01 AM

## 2018-07-11 NOTE — Progress Notes (Signed)
NAME:  Taylor Elliott, MRN:  [id number], DOB:  April 12, 1951, LOS: 7 ADMISSION DATE:  07/04/2018, CONSULTATION DATE:  07/05/2018 REFERRING MD:  CHIEF COMPLAINT:  Hyponatremia  Brief History / History of Present Illness   Taylor Elliott is a 67 YO gentleman who has been transferred to ICU from [place] ED for higher level management of hyponatremia.  Patient states that he was in his USOH until about a week ago. He stated that he was in [place] for thanksgiving and that night when he got back home he developed acute onset vomiting and diarrhea. He states that he had multiple bouts of vomiting and diarrhea that lasted for about 2 days. He took imodium and his symptoms resolved. During the week he developed congestion and he obtained over the counter decongestants with some improvement. He denies fever or chills. He however states that he had had no appetite and therefore has not been eating. He states that he has been drinking gatorade and some water. He endorses occasional cough productive of clear sputum. He denies hemoptysis. He denies chest pain. He denies any sick contacts. He states that last night diarrhea recurred without vomiting. He had 2 bouts then took imodium. He thus decided to contact his PCP. He was seen by his PCP and had CXR and abdominal Xray that were reported as normal. He had lab works done that revealed hyponatremia with sodium of 113. He was advised to present emergently to the ER. He denies any seizures or dizziness. He states that he has had gait disturbances and imbalance for several years due to his peripheral neuropathy. He states that his imbalance has been progressively worsening over the past three months.   At the OSH ED he was found to have a sodium of 112., unremarkable CBC. He had a CT head done that showed no acute intracranial abnormality An LP was performed that showed elevated protein with normal cell count.   Moved to ICU for sodium stabilization which is been  accomplished with 3% saline.,  Question a multifactorial process, SIADH and volume depletion.  Question contribution of his Paxil.  He is continued to have episodic neurological symptoms that last for variable periods of time, characterized by twitching, word finding difficulty.  An extensive neurological evaluation has not found an etiology, no evidence of partial seizures during the episodes themselves by continuous EEG.  Some question as to a functional psychogenic component.  Other labs have been sent including thyroid peroxidase, thyroglobulin antibody (both negative), ANA negative, NMDA on CSF pending (confirmed by neurology)   Past Medical History  -Right renal carcinoma status post nephrectomy -Essential hypertension -Hyperlipidemia -Prostate cancer status post prostatectomy now with urinary incontinence -peripheral neuropathy of bilateral lower extremities -Status post Left trans metatarsal amputation  Significant Hospital Events   ICU admission 07/05/2018 12/8 - Seizure like episode, CT head negative, stat MRI/EEG - pending   Consults:  Critical care Neurology Nephrology   Procedures:  R IJ CVC   Significant Diagnostic Tests:  Brain MRI 12/12 >>  12/8 CT Head - Negative  12/8 MRI Brain - pending  12/8 EEG - pending   Micro Data:  NGTD   Antimicrobials:  None   Subjective:  I stopped his 3% saline 12/12, followed sodium through the evening, stable MRI brain performed 12/12 Family asking questions about possible Lyme titers.  He does spend time at [facility], no known tick bite   Objective   Blood pressure 140/79, pulse (!) 29, temperature 98  F (36.7 C), temperature source Oral, resp. rate 16, height 5\' 8"  (1.727 m), weight 86.6 kg, SpO2 91 %.        Intake/Output Summary (Last 24 hours) at 07/11/2018 1308 Last data filed at 07/11/2018 1000 Gross per 24 hour  Intake 1178.62 ml  Output 1275 ml  Net -96.38 ml   Filed Weights   07/08/18 0500  07/09/18 0500 07/11/18 0422  Weight: 94.7 kg 91.4 kg 86.6 kg    Examination: General appearance: Awake, interacting, sitting up brighter affect Eyes: Pupils equal and reactive HENT: Oropharynx moist, no lesions Neck: No stridor, no JVD, right IJ CVC in place Lungs: Clear bilaterally, no crackles, no wheezes CV: Regular, no murmur Abdomen: Soft, nondistended with positive bowel sounds Extremities: No significant edema.  Does have left transmetatarsal amputation, chronic Skin: No rash Psych: More calm today, less frustration Neuro: He still intermittently has some delayed responsiveness, apparent word finding difficulty.  I do not see any facial twitching today.  He has good strength throughout, does redirect and follow commands.  I have heard him talking completely normally for most of the morning     Assessment & Plan:   Hyponatremia with initial seizure/strokelike symptoms at presentation, no evidence CVA by MRI 12/8.  Suspec symptomatic hyponatremia at presentation.  Mixed picture with regard to cause.  His SSRI has been stopped, he has responded appropriately to 3% saline and no current evidence for SIADH or following sodium after discontinuation  Persistent, waxing/waning word finding difficulties and twitching, under evaluation by neurology. Findings at presentation certainly could have been related to hyponatremia.  The persistent symptoms are atypical and less easily explained.  Some question as to whether some of this represents pseudoseizure or psychogenic finding since no seizure activity seen during EEG monitoring during episodes. Continue Keppra as ordered Day 3 of 5 high-dose Solu-Medrol per neurology recommendations for possible cerebritis, autoimmune encephalitis (no evidence by MRI brain) Viral studies from his LP apparently were never done but NMDA on CSF was sent (this was confirmed by Taylor Elliott) Serum thyroglobulin, microsomal antibody levels, LG I-1 antibody, VG KC  antibody are all ordered to evaluate for possible autoimmune encephalitis, negative thus far.    Hypertension Amlodipine, lisinopril  Depression/Anxiety No plan to restart Paxil or SSRI given potential connection with his hyponatremia  Hyperlipidemia Statin as ordered  Gait disturbance Imbalance Peripheral neuropathy PT/OT to assist with ambulation  Right rotator cuff injury Appreciate Dr. Gavin Potters evaluation in the hospital Voltaren gel was recommended and we will set up follow-up in the outpatient setting  Tobacco abuse Nicotine patch, smoking cessation   Best practice:  Diet: Regular diet Pain/Anxiety/Delirium protocol (if indicated): Not indicated VAP protocol (if indicated):Not indicated  DVT prophylaxis: Enoxaparin GI prophylaxis: Not indicated  Glucose control: Maintain euglycemia Mobility: PT and OT to evaluate and treat Code Status: Discussed code status with the patient and he is full code Family Communication: All issues and plans discussed with the patient and his wife 12/13  Labs   CBC: Recent Labs  Lab 07/04/18 1646 07/05/18 0030 07/06/18 0431 07/07/18 0435 07/08/18 0501 07/09/18 0319 07/11/18 0339  WBC 5.2 4.1 4.2 3.6* 4.2 3.4* 7.3  NEUTROABS 4.2 3.1 3.2 2.8  --   --   --   HGB 12.9* 12.5* 12.3* 11.0* 10.9* 10.6* 10.5*  HCT 36.1* 36.1* 35.6* 33.2* 32.7* 32.5* 32.4*  MCV 84.7 85.1 85.2 88.1 88.1 89.0 89.3  PLT 170 168 171 151 167 157 201    Basic  Metabolic Panel: Recent Labs  Lab 07/04/18 1646  07/09/18 0319  07/10/18 0241  07/10/18 1500 07/10/18 1922 07/10/18 2328 07/11/18 0339 07/11/18 1051  NA 112*   < > 130*   < > 135   < > 138 136 137 136 136  K 4.1   < > 3.3*  --  3.9  --   --  3.5 3.4* 3.4*  --   CL 83*   < > 98  --  105  --   --  101 101 100  --   CO2 21*   < > 25  --  22  --   --  23 25 26   --   GLUCOSE 108*   < > 181*  --  162*  --   --  201* 194* 172*  --   BUN 9   < > 6*  --  7*  --   --  8 11 13   --   CREATININE 0.73   < >  0.82  --  0.75  --   --  0.86 0.98 0.85  --   CALCIUM 8.6*   < > 8.5*  --  8.9  --   --  9.4 9.3 9.5  --   MG 1.7  --   --   --   --   --   --   --   --   --   --    < > = values in this interval not displayed.   GFR: Estimated Creatinine Clearance: 91.5 mL/min (by C-G formula based on SCr of 0.85 mg/dL). Recent Labs  Lab 07/07/18 0435 07/08/18 0501 07/09/18 0319 07/11/18 0339  WBC 3.6* 4.2 3.4* 7.3    Liver Function Tests: Recent Labs  Lab 07/09/18 0319  AST 17  ALT 23  ALKPHOS 91  BILITOT 0.4  PROT 5.2*  ALBUMIN 2.8*   No results for input(s): LIPASE, AMYLASE in the last 168 hours. No results for input(s): AMMONIA in the last 168 hours.  ABG No results found for: PHART, PCO2ART, PO2ART, HCO3, TCO2, ACIDBASEDEF, O2SAT   Coagulation Profile: Recent Labs  Lab 07/04/18 1646  INR 0.92    Cardiac Enzymes: No results for input(s): CKTOTAL, CKMB, CKMBINDEX, TROPONINI in the last 168 hours.  HbA1C: No results found for: HGBA1C  CBG: Recent Labs  Lab 07/04/18 2051 07/06/18 0235 07/09/18 2015  GLUCAP 96 121* 103*    Baltazar Apo, MD, PhD 07/11/2018, 1:08 PM [facility] [phone] or if no answer [phone]

## 2018-07-11 NOTE — Progress Notes (Addendum)
NEUROLOGY PROGRESS NOTE  Subjective: patient awake, alert, in bed, NAD in good spirits. Laughing and joking. Stated that clonazepam made him feel groggy. They would like for xanax for his anxiety. Patient appears to have some stuttering speech when he gets worked up. He is currently concerned about what is wrong with him and wants answers. Wife at bedside. They both state that his speech is the best in the morning and it gets worse throughout the day.  Exam: Vitals:   07/11/18 0900 07/11/18 1000  BP: (!) 135/107 130/66  Pulse: 82 99  Resp: 18 17  Temp:    SpO2: 100% 100%    Physical Exam  HEENT-  Normocephalic, no lesions, without obvious abnormality.  Normal external eye and conjunctiva.   Extremities- Warm, dry and intact Musculoskeletal-no joint tenderness, deformity or swelling Skin-warm and dry, no hyperpigmentation, vitiligo, or suspicious lesions   Neuro:  Mental Status: Alert, oriented, thought content appropriate.  Speech fluent at times. Patient's speech waxes and wanes between stuttering and normal speech. His speech tends to get worse and he stutters more when he is agitated/upset.  Able to follow commands without difficulty. Cranial Nerves: II:  Visual fields grossly normal III,IV, VI: ptosis not present, extra-ocular motions intact bilaterally, PERRL V,VII: smile symmetric- no facial twitching noted.  Facial light touch sensation normal bilaterally VIII: hearing normal bilaterally IX,X: uvula midline XI: bilateral shoulder shrug XII: midline tongue extension Motor: Right : Upper extremity   5/5  Left:     Upper extremity   5/5  Lower extremity   5/5   Lower extremity   5/5 Tone and bulk:normal tone throughout; no atrophy noted Sensory:light touch intact throughout, bilaterally Deep Tendon Reflexes: 2+ and symmetric throughout  Plantars: Right: downgoing   Left: no toes on left foot Cerebellar: FNF intact.  With heel-to-shin he does not show any abnormalities.      Medications:  Scheduled: . allopurinol  200 mg Oral q morning - 10a  . amLODipine  10 mg Oral q morning - 10a  . aspirin EC  81 mg Oral Daily  . diclofenac sodium  2 g Topical QID  . enoxaparin (LOVENOX) injection  40 mg Subcutaneous Q24H  . fenofibrate  160 mg Oral Daily  . lisinopril  20 mg Oral Daily  . nicotine  21 mg Transdermal Daily  . rosuvastatin  10 mg Oral q1800  . traZODone  100 mg Oral QHS    Pertinent Labs/Diagnostics: Speech Clinical Impression  Pt's oropharyngeal swallow appeared to be grossly functional during PO trials until consumption of pills with water, after which he had a delayed cough on 2 out of 2 trials (taking multiple pills at once).   -Sodium is 136 -BUN 13 -Potassium: 3.4 -Glucose 172    Laurey Morale, MSN, NP-C [facility] [phone]  EEG 12/11: This EEG is within normal limits. No epileptiform discharges or EEG seizures were recorded. Multiple patient events were recorded with no EEG change.   Impression - No significant change in impression from yesterday:  67 year old male with multiplebriefepisodes of expressive aphasia. Also with an episode of frank seizure activity following the initial Neurology consult on 12/8, consisting ofhead version to the left with left gaze deviation and left upper extremity tonic-clonic jerking lasting approximately 2 minutes which resolved with Ativan. He was started on Keppra at that time. Overnight on 12/9-10 he had a recurrent spell of word finding difficulty after havingapproximately [place] he becomes very frustrated with  trying to get his words out.Overnight on 12/10-11 he had additional spells that were captured with no electrographic correlate on LTM EEG.  1. Initial presentation: Most consistent with a provoked seizure secondary to hyponatremia. 2. Speech arrest events during hospitalization: Initial epileptic seizure was followed by either recurrent partial  complex seizures or, more likely, pseudoseizures.  3. MRI brain shows a left parietal arachnoid cyst. Arachnoid cysts are almost always asymptomatic and the finding on this scan is felt mos likely to be incidental. 4. Initial EEG on 12/8 revealed triphasic waves with no electrographic seizures. RepeatEEGfrom 12/10/2019showed no epileptiform activity after Kepprawasincreased to 750 mgBID. 5. Has had florid recurrence of what appears most likely to be pseudoseizure activity and psychogenic speech deficit with odd stuttering pattern admixed with frequent pejorative statements. This waxes and wanes and seems to get worse with worsening mood, as it correlates roughly with his affective state.  6. Hyponatremia is now resolved at 136. Nephrology feels that his hyponatremia could have been related to hypovolemic hyponatremia on admission.  There was response to IV saline. Nephrology also feels that there could be an element of SIADH in the setting of encephalopathy, noting that the 3% saline stopped urine osmolality 773 urine sodium 130 this appears to be consistent with SIADH.   7. Possible autoimmune encephalopathy versus conversion disorder versus secondary gain. Given the elevated protein in his CSF (126), which increases the likelihood of an autoimmune/paraneoplastic etiology (also given history of prostate CA and renal cell carcinoma), benefits of empiric pulsed dose steroid regimen outweigh the risks.   . Recommendations: 1.Continue Keppra 2. May need a psychology evaluation as EEG showed no electrographic correlate for his recurrent speech deficits.  3. Discontinue clonazepam for anxiety. Start xanax for anxiety PRN. 4. Seizure precautions.  5. Additional labs added to CSF sample sent from OSH to look for possible autoimmune encephalopathy. An NMDA antibody titer has been ordered for the remaining CSF sample and also will be run on serum. Serum thyroglobulin and microsomal antibody levels have  also been ordered. Serum LGI-1 antibody to assess for possible anti-LGI-1 autoimmune encephalitis with hyponatremia (can cause SIADH along with the encephalitis) has been ordered as a [facility] sendout. Serum anti-VGKC antibody level also has been ordered as this also can result in an autoimmune encephalitis with hyponatremia.   Of note:-- CSF culture shows no growth x 3 days  Electronically signed: Dr. Kerney Elbe

## 2018-07-11 NOTE — Plan of Care (Signed)
  Problem: Education: Goal: Knowledge of General Education information will improve Description Including pain rating scale, medication(s)/side effects and non-pharmacologic comfort measures Outcome: Progressing   Problem: Health Behavior/Discharge Planning: Goal: Ability to manage health-related needs will improve Outcome: Progressing   Problem: Clinical Measurements: Goal: Ability to maintain clinical measurements within normal limits will improve Outcome: Progressing Note:  Sodium level 137 at last check.  Goal: Will remain free from infection Outcome: Progressing Goal: Diagnostic test results will improve Outcome: Progressing Goal: Respiratory complications will improve Outcome: Progressing Note:  On room air while awake, OSA but refusing CPAP, 2L O2 placed at night. Goal: Cardiovascular complication will be avoided Outcome: Progressing   Problem: Activity: Goal: Risk for activity intolerance will decrease Outcome: Progressing Note:  Walked in hall with PT.   Problem: Nutrition: Goal: Adequate nutrition will be maintained Outcome: Progressing Note:  Decreased appetite, able to eat some dinner brought by family.   Problem: Elimination: Goal: Will not experience complications related to urinary retention Outcome: Progressing   Problem: Pain Managment: Goal: General experience of comfort will improve Outcome: Progressing Note:  C/O headache this evening, improved with tylenol, chronic right shoulder pain, improved after voltaren cream applied.   Problem: Safety: Goal: Ability to remain free from injury will improve Outcome: Progressing Note:  Very unsteady, used walker to stand on side of bed, high fall risk   Problem: Skin Integrity: Goal: Risk for impaired skin integrity will decrease Outcome: Progressing   Problem: Coping: Goal: Level of anxiety will decrease Outcome: Not Progressing Note:  High anxiety, tearful at times, irritable at times.

## 2018-07-11 NOTE — Progress Notes (Signed)
Subjective: Reports bilateral feet are doing well. Still having a lot of shoulder pain, but working with Physical therapy and improving.Tearful and frustrated with slow improvement.    Objective: Vital signs in last 24 hours: Temp:  [97.6 F (36.4 C)-98.4 F (36.9 C)] 97.6 F (36.4 C) (12/13 0743) Pulse Rate:  [69-109] 91 (12/13 0805) Resp:  [14-26] 15 (12/13 0805) BP: (85-177)/(60-111) 159/81 (12/13 0805) SpO2:  [95 %-100 %] 100 % (12/13 0805) Weight:  [86.6 kg] 86.6 kg (12/13 0422)  Intake/Output from previous day: 12/12 0701 - 12/13 0700 In: 1376.6 [P.O.:740; I.V.:208.7; IV Piggyback:307.9] Out: 1425 [Urine:1425] Intake/Output this shift: Total I/O In: 240 [P.O.:240] Out: 300 [Urine:300]  Recent Labs    07/09/18 0319 07/11/18 0339  HGB 10.6* 10.5*   Recent Labs    07/09/18 0319 07/11/18 0339  WBC 3.4* 7.3  RBC 3.65* 3.63*  HCT 32.5* 32.4*  PLT 157 201   Recent Labs    07/10/18 2328 07/11/18 0339  NA 137 136  K 3.4* 3.4*  CL 101 100  CO2 25 26  BUN 11 13  CREATININE 0.98 0.85  GLUCOSE 194* 172*  CALCIUM 9.3 9.5   No results for input(s): LABPT, INR in the last 72 hours. Right foot without worsening ulcer, mainly callus.  Right shoulder painful with movement and at rest per patient.    Assessment/Plan: Right foot plantar ulcer-stable Left foot TMA- Stable Right shoulder rotator cuff strain- continue voltaren gel, therapies.  Follow up in the office once discharged.    [facility] orthopedic [phone]

## 2018-07-11 NOTE — Progress Notes (Signed)
[facility] ROUNDING NOTE   Subjective:   Resting comfortably speech better this morning  Blood pressure 135/107 pulse 82 temperature 97.6 O2 sats 100% 2 L nasal cannula  IV Keppra 1 g every 12 hours  IV Solu-Medrol 1 g daily started 07/09/2018  Repeat MRI brain ordered.  MRI 07/06/2018 showed chronic small vessel ischemic changes in the pons no focal cerebellar insult cerebral hemispheres with atrophy focally prominent left parietal gyri.  No mass lesions hemorrhages hydrocephalus subdural collections noted mesial temporal lobes appear symmetric and in the normal limits.  Chest x-ray shows right IJ catheter in SVC  Sodium 136 potassium 3.4 chloride 100 CO2 26 glucose 172 BUN 13 creatinine 0.85 calcium 9.5 WBC 7.3 hemoglobin 10.5 platelets 201 urine osmolality 773 urine sodium 130.      Objective:  Vital signs in last 24 hours:  Temp:  [97.6 F (36.4 C)-98.4 F (36.9 C)] 97.6 F (36.4 C) (12/13 0743) Pulse Rate:  [69-109] 82 (12/13 0900) Resp:  [14-26] 18 (12/13 0900) BP: (85-177)/(60-111) 135/107 (12/13 0900) SpO2:  [95 %-100 %] 100 % (12/13 0900) Weight:  [86.6 kg] 86.6 kg (12/13 0422)  Weight change:  Filed Weights   07/08/18 0500 07/09/18 0500 07/11/18 0422  Weight: 94.7 kg 91.4 kg 86.6 kg    Intake/Output: I/O last 3 completed shifts: In: 2157.5 [P.O.:740; I.V.:721.6; Other:130; IV Piggyback:566] Out: 1425 [Urine:1425]   Intake/Output this shift:  Total I/O In: 240 [P.O.:240] Out: 300 [Urine:300]  CVS- RRR no murmurs rubs gallops RS- CTA no wheezes rales ABD- BS present soft non-distended EXT-right trans-met to tarsal amputation   Basic Metabolic Panel: Recent Labs  Lab 07/04/18 1646  07/09/18 0319  07/10/18 0241  07/10/18 1200 07/10/18 1500 07/10/18 1922 07/10/18 2328 07/11/18 0339  NA 112*   < > 130*   < > 135   < > 136 138 136 137 136  K 4.1   < > 3.3*  --  3.9  --   --   --  3.5 3.4* 3.4*  CL 83*   < > 98  --  105  --   --    --  101 101 100  CO2 21*   < > 25  --  22  --   --   --  '23 25 26  '$ GLUCOSE 108*   < > 181*  --  162*  --   --   --  201* 194* 172*  BUN 9   < > 6*  --  7*  --   --   --  '8 11 13  '$ CREATININE 0.73   < > 0.82  --  0.75  --   --   --  0.86 0.98 0.85  CALCIUM 8.6*   < > 8.5*  --  8.9  --   --   --  9.4 9.3 9.5  MG 1.7  --   --   --   --   --   --   --   --   --   --    < > = values in this interval not displayed.    Liver Function Tests: Recent Labs  Lab 07/04/18 1134 07/09/18 0319  AST 22 17  ALT 27 23  ALKPHOS 108 91  BILITOT 0.5 0.4  PROT 6.4 5.2*  ALBUMIN 4.1 2.8*   Recent Labs  Lab 07/04/18 1134  LIPASE 26.0  AMYLASE 49   No results for input(s): AMMONIA in the last  168 hours.  CBC: Recent Labs  Lab 07/04/18 1134 07/04/18 1646 07/05/18 0030 07/06/18 0431 07/07/18 0435 07/08/18 0501 07/09/18 0319 07/11/18 0339  WBC 5.3 5.2 4.1 4.2 3.6* 4.2 3.4* 7.3  NEUTROABS 4.4 4.2 3.1 3.2 2.8  --   --   --   HGB 13.5 12.9* 12.5* 12.3* 11.0* 10.9* 10.6* 10.5*  HCT 38.1* 36.1* 36.1* 35.6* 33.2* 32.7* 32.5* 32.4*  MCV 87.6 84.7 85.1 85.2 88.1 88.1 89.0 89.3  PLT 180.0 170 168 171 151 167 157 201    Cardiac Enzymes: No results for input(s): CKTOTAL, CKMB, CKMBINDEX, TROPONINI in the last 168 hours.  BNP: Invalid input(s): POCBNP  CBG: Recent Labs  Lab 07/04/18 2051 07/06/18 0235 07/09/18 2015  GLUCAP 96 121* 103*    Microbiology: Results for orders placed or performed during the hospital encounter of 07/04/18  CSF culture     Status: None   Collection Time: 07/04/18  9:49 PM  Result Value Ref Range Status   Specimen Description CSF  Final   Special Requests NONE  Final   Gram Stain NO WBC SEEN NO ORGANISMS SEEN   Final   Culture   Final    NO GROWTH 3 DAYS Performed at [facility], [place] [address], [place], [place] [address]    Report Status 07/08/2018 FINAL  Final  MRSA PCR Screening     Status: None   Collection Time: 07/04/18 11:54 PM  Result  Value Ref Range Status   MRSA by PCR NEGATIVE NEGATIVE Final    Comment:        The GeneXpert MRSA Assay (FDA approved for NASAL specimens only), is one component of a comprehensive MRSA colonization surveillance program. It is not intended to diagnose MRSA infection nor to guide or monitor treatment for MRSA infections. Performed at [facility], [place] [address], [place], [place] [address]   Culture, blood (routine x 2)     Status: None   Collection Time: 07/05/18 12:30 AM  Result Value Ref Range Status   Specimen Description BLOOD RIGHT ANTECUBITAL  Final   Special Requests   Final    BOTTLES DRAWN AEROBIC ONLY Blood Culture adequate volume   Culture   Final    NO GROWTH 5 DAYS Performed at [facility], [place] [address], [place], [place] [address]    Report Status 07/10/2018 FINAL  Final  Culture, blood (routine x 2)     Status: None   Collection Time: 07/05/18 12:36 AM  Result Value Ref Range Status   Specimen Description BLOOD LEFT ANTECUBITAL  Final   Special Requests   Final    BOTTLES DRAWN AEROBIC ONLY Blood Culture adequate volume   Culture   Final    NO GROWTH 5 DAYS Performed at [facility], [place] [address], [place], [place] [address]    Report Status 07/10/2018 FINAL  Final    Coagulation Studies: No results for input(s): LABPROT, INR in the last 72 hours.  Urinalysis: Recent Labs    07/10/18 2312  COLORURINE YELLOW  LABSPEC 1.017  PHURINE 6.0  GLUCOSEU 50*  HGBUR NEGATIVE  BILIRUBINUR NEGATIVE  KETONESUR NEGATIVE  PROTEINUR NEGATIVE  NITRITE NEGATIVE  LEUKOCYTESUR NEGATIVE      Imaging: Mr Jeri Cos KG Contrast  Result Date: 07/10/2018 CLINICAL DATA:  67 year old male with altered mental status. Multiple episodes of expressive aphasia and seizure-like activity. EXAM: MRI HEAD WITHOUT AND WITH CONTRAST TECHNIQUE: Multiplanar, multiecho pulse sequences of the  brain and surrounding structures were obtained without and with  intravenous contrast. CONTRAST:  10 milliliters Gadavist COMPARISON:  Brain MRI 07/06/2018 and earlier. FINDINGS: Brain: No restricted diffusion to suggest acute infarction. No midline shift, mass effect, evidence of mass lesion, ventriculomegaly, extra-axial collection or acute intracranial hemorrhage. Cervicomedullary junction and pituitary are within normal limits. Pearline Cables and white matter signal remains within normal limits for age. Sulcal variation or disproportionate cerebral volume loss in the left parietal lobe redemonstrated with no cortical encephalomalacia. No heterotopia or migrational disorder. No chronic cerebral blood products. No abnormal enhancement identified. No dural thickening. Vascular: Major intracranial vascular flow voids are preserved and stable. The major dural venous sinuses are enhancing and appear patent. Skull and upper cervical spine: Negative visible cervical spine. Normal bone marrow signal. Sinuses/Orbits: Stable and negative. Other: Mild right mastoid effusion is stable. Negative visible nasopharynx. Visible internal auditory structures appear normal. Scalp and face soft tissues appear negative. IMPRESSION: Continued stable MRI appearance of the brain, largely negative for age. No abnormal enhancement or No acute intracranial abnormality. Electronically Signed   By: Genevie Ann M.D.   On: 07/10/2018 17:12     Medications:   . levETIRAcetam Stopped (07/11/18 0436)  . methylPREDNISolone (SOLU-MEDROL) injection 1,000 mg (07/11/18 0940)   . allopurinol  200 mg Oral q morning - 10a  . amLODipine  10 mg Oral q morning - 10a  . aspirin EC  81 mg Oral Daily  . diclofenac sodium  2 g Topical QID  . enoxaparin (LOVENOX) injection  40 mg Subcutaneous Q24H  . fenofibrate  160 mg Oral Daily  . lisinopril  20 mg Oral Daily  . nicotine  21 mg Transdermal Daily  . rosuvastatin  10 mg Oral q1800  . traZODone  100 mg Oral QHS   acetaminophen, ondansetron (ZOFRAN) IV,  oxyCODONE  Assessment/ Plan:   Hyponatremia.  This could have been related to hypo-volemic hyponatremia on admission.  There was response to IV saline.  Transition to 3% normal saline would make the diagnosis difficult.  Could be an element of SIADH in the setting of encephalopathy.  The etiology of the encephalopathy appears elusive.  Certainly electrolyte abnormalities could be responsible however with return of serum sodium I think this is unlikely. The 3% saline stopped urine osmolality 773 urine sodium 130 this appears to be consistent with SIADH.  We will continue to follow sodium.  May need to add Lasix.  Encephalopathy.  Appreciate neurology help Dr. Cheral Marker.  Patient is receiving 1 g of methylprednisolone daily.  Etiological factors could involve vasculitis, infections that could be spirochetal, protozoal, viral, or bacteria, paraneoplastic syndrome, idiopathic.     LOS: [place] '@TODAY''@10'$ :00 AM

## 2018-07-12 ENCOUNTER — Encounter (HOSPITAL_COMMUNITY): Payer: Self-pay

## 2018-07-12 DIAGNOSIS — F329 Major depressive disorder, single episode, unspecified: Secondary | ICD-10-CM

## 2018-07-12 DIAGNOSIS — F419 Anxiety disorder, unspecified: Secondary | ICD-10-CM

## 2018-07-12 DIAGNOSIS — R4789 Other speech disturbances: Secondary | ICD-10-CM

## 2018-07-12 LAB — N-METHYL-D-ASPARTATE RECPT.IGG: N-methyl-D-Aspartate Recpt.IgG: <1:10 {titer}

## 2018-07-12 LAB — HSV DNA BY PCR (REFERENCE LAB)
HSV 1 DNA: NEGATIVE
HSV 2 DNA: NEGATIVE

## 2018-07-12 LAB — BASIC METABOLIC PANEL
Anion gap: 12 (ref 5–15)
BUN: 17 mg/dL (ref 8–23)
CO2: 22 mmol/L (ref 22–32)
Calcium: 9.4 mg/dL (ref 8.9–10.3)
Chloride: 102 mmol/L (ref 98–111)
Creatinine, Ser: 1.01 mg/dL (ref 0.61–1.24)
GFR calc Af Amer: >60 mL/min (ref >60)
GFR calc non Af Amer: >60 mL/min (ref >60)
Glucose, Bld: 139 mg/dL — ABNORMAL HIGH (ref 70–99)
Potassium: 3.9 mmol/L (ref 3.5–5.1)
Sodium: 136 mmol/L (ref 135–145)

## 2018-07-12 LAB — MPO/PR-3 (ANCA) ANTIBODIES
ANCA Proteinase 3: <3.5 U/mL (ref 0.0–3.5)
Myeloperoxidase Abs: <9 U/mL (ref 0.0–9.0)

## 2018-07-12 LAB — MAGNESIUM: Magnesium: 1.8 mg/dL (ref 1.7–2.4)

## 2018-07-12 NOTE — Plan of Care (Signed)
  Problem: Education: Goal: Knowledge of General Education information will improve Description: Including pain rating scale, medication(s)/side effects and non-pharmacologic comfort measures Outcome: Progressing   Problem: Clinical Measurements: Goal: Ability to maintain clinical measurements within normal limits will improve Outcome: Progressing Goal: Will remain free from infection Outcome: Progressing   

## 2018-07-12 NOTE — Progress Notes (Addendum)
PROGRESS NOTE    Taylor Elliott  IRC:[id number]  DOB: 18-Apr-1951  DOA: 07/04/2018 PCP: Ann Held, DO  Brief Narrative:   67 year old male with history of renal cell carcinoma status post nephrectomy, prostate cancer status post prostatectomy, hypertension, hyperlipidemia, peripheral neuropathy, anxiety disorder presented to [facility] ED with severe symptomatic hyponatremia with sodium of 113.  He reported episodes of nausea vomiting and diarrhea a few days prior to presentation.He had a CT head done that showed no acute intracranial abnormality.An LP was performed that showed elevated protein with normal cell count.  He was transferred to [facility] and admitted to ICU.  He did have a witnessed seizure episode during neurology evaluation here.  His sodium normalized with hypertonic saline and he is now transferred out to medical floor.  Patient however has had episodes of confusion and agitation especially in the evenings.  There has been some concern for episodic word finding difficulties and EEG testing during these episodes did not show any abnormal activity.  Psychogenic component suspected per neurology.  Patient now on fluid restriction and some of his psych medications have been held in concern for SIADH induced hyponatremia.  CSF work-up has largely been negative, some autoimmune work-up like NMDA receptor antibody still pending.Neurology and nephrology following.  Subjective:  Patient introduced himself and greeted me appropriately.  His speech appeared fluent and he discussed various aspects of his hospitalization.However he started speaking slowly with pressured speech specifically while reporting word finding difficulty that he has experienced lately.  He acknowledges feeling anxious and emotional at times.  Denies any headaches or visual changes.    Objective: Vitals:   07/11/18 2053 07/12/18 0443 07/12/18 0936 07/12/18 1334  BP: 137/78 130/66 120/62 128/63  Pulse: 87 80  87 89  Resp:   18 18  Temp: 97.7 F (36.5 C) 97.9 F (36.6 C) 98.2 F (36.8 C) 98.1 F (36.7 C)  TempSrc: Oral Oral Oral Oral  SpO2: 97% 95% 97% 98%  Weight:      Height:        Intake/Output Summary (Last 24 hours) at 07/12/2018 1738 Last data filed at 07/11/2018 1845 Gross per 24 hour  Intake 50 ml  Output 450 ml  Net -400 ml   Filed Weights   07/08/18 0500 07/09/18 0500 07/11/18 0422  Weight: 94.7 kg 91.4 kg 86.6 kg    Physical Examination:  General exam: Appears calm and comfortable  Respiratory system: Clear to auscultation. Respiratory effort normal. Cardiovascular system: S1 & S2 heard, RRR. No JVD, murmurs, rubs, gallops or clicks. No pedal edema. Gastrointestinal system: Abdomen is nondistended, soft and nontender. No organomegaly or masses felt. Normal bowel sounds heard. Central nervous system: Alert and oriented. No focal neurological deficits. Extremities: Symmetric 5 x 5 power.  No edema. Skin: No rashes, lesions or ulcers Psychiatry: Judgement and insight appear subnormal. Mood & affect: Impulsive/anxious    Data Reviewed: I have personally reviewed following labs and imaging studies  CBC: Recent Labs  Lab 07/06/18 0431 07/07/18 0435 07/08/18 0501 07/09/18 0319 07/11/18 0339  WBC 4.2 3.6* 4.2 3.4* 7.3  NEUTROABS 3.2 2.8  --   --   --   HGB 12.3* 11.0* 10.9* 10.6* 10.5*  HCT 35.6* 33.2* 32.7* 32.5* 32.4*  MCV 85.2 88.1 88.1 89.0 89.3  PLT 171 151 167 157 510   Basic Metabolic Panel: Recent Labs  Lab 07/10/18 0241  07/10/18 1922 07/10/18 2328 07/11/18 0339 07/11/18 1051 07/12/18 0248 07/12/18 2585  NA 135   < > 136 137 136 136 136  --   K 3.9  --  3.5 3.4* 3.4*  --  3.9  --   CL 105  --  101 101 100  --  102  --   CO2 22  --  23 25 26   --  22  --   GLUCOSE 162*  --  201* 194* 172*  --  139*  --   BUN 7*  --  8 11 13   --  17  --   CREATININE 0.75  --  0.86 0.98 0.85  --  1.01  --   CALCIUM 8.9  --  9.4 9.3 9.5  --  9.4  --   MG  --    --   --   --   --   --   --  1.8   < > = values in this interval not displayed.   GFR: Estimated Creatinine Clearance: 77 mL/min (by C-G formula based on SCr of 1.01 mg/dL). Liver Function Tests: Recent Labs  Lab 07/09/18 0319  AST 17  ALT 23  ALKPHOS 91  BILITOT 0.4  PROT 5.2*  ALBUMIN 2.8*   No results for input(s): LIPASE, AMYLASE in the last 168 hours. No results for input(s): AMMONIA in the last 168 hours. Coagulation Profile: No results for input(s): INR, PROTIME in the last 168 hours. Cardiac Enzymes: No results for input(s): CKTOTAL, CKMB, CKMBINDEX, TROPONINI in the last 168 hours. BNP (last 3 results) No results for input(s): PROBNP in the last 8760 hours. HbA1C: No results for input(s): HGBA1C in the last 72 hours. CBG: Recent Labs  Lab 07/06/18 0235 07/09/18 2015  GLUCAP 121* 103*   Lipid Profile: No results for input(s): CHOL, HDL, LDLCALC, TRIG, CHOLHDL, LDLDIRECT in the last 72 hours. Thyroid Function Tests: No results for input(s): TSH, T4TOTAL, FREET4, T3FREE, THYROIDAB in the last 72 hours. Anemia Panel: No results for input(s): VITAMINB12, FOLATE, FERRITIN, TIBC, IRON, RETICCTPCT in the last 72 hours. Sepsis Labs: No results for input(s): PROCALCITON, LATICACIDVEN in the last 168 hours.  Recent Results (from the past 240 hour(s))  CSF culture     Status: None   Collection Time: 07/04/18  9:49 PM  Result Value Ref Range Status   Specimen Description CSF  Final   Special Requests NONE  Final   Gram Stain NO WBC SEEN NO ORGANISMS SEEN   Final   Culture   Final    NO GROWTH 3 DAYS Performed at [facility], [address], [place], [place] [address]    Report Status 07/08/2018 FINAL  Final  MRSA PCR Screening     Status: None   Collection Time: 07/04/18 11:54 PM  Result Value Ref Range Status   MRSA by PCR NEGATIVE NEGATIVE Final    Comment:        The GeneXpert MRSA Assay (FDA approved for NASAL specimens only), is one component of  a comprehensive MRSA colonization surveillance program. It is not intended to diagnose MRSA infection nor to guide or monitor treatment for MRSA infections. Performed at [facility], [place] [address] [place], [place] [address]   Culture, blood (routine x 2)     Status: None   Collection Time: 07/05/18 12:30 AM  Result Value Ref Range Status   Specimen Description BLOOD RIGHT ANTECUBITAL  Final   Special Requests   Final    BOTTLES DRAWN AEROBIC ONLY Blood Culture adequate volume  Culture   Final    NO GROWTH 5 DAYS Performed at [facility], [place] [address], [place], [place] [address]    Report Status 07/10/2018 FINAL  Final  Culture, blood (routine x 2)     Status: None   Collection Time: 07/05/18 12:36 AM  Result Value Ref Range Status   Specimen Description BLOOD LEFT ANTECUBITAL  Final   Special Requests   Final    BOTTLES DRAWN AEROBIC ONLY Blood Culture adequate volume   Culture   Final    NO GROWTH 5 DAYS Performed at [facility], [place] [address], [place], [place] [address]    Report Status 07/10/2018 FINAL  Final      Radiology Studies: No results found.      Scheduled Meds: . allopurinol  200 mg Oral q morning - 10a  . amLODipine  10 mg Oral q morning - 10a  . aspirin EC  81 mg Oral Daily  . diclofenac sodium  2 g Topical QID  . docusate sodium  100 mg Oral Daily  . enoxaparin (LOVENOX) injection  40 mg Subcutaneous Q24H  . fenofibrate  160 mg Oral Daily  . lisinopril  20 mg Oral Daily  . nicotine  21 mg Transdermal Daily  . rosuvastatin  10 mg Oral q1800  . traZODone  100 mg Oral QHS   Continuous Infusions: . levETIRAcetam 1,000 mg (07/12/18 0648)  . methylPREDNISolone (SOLU-MEDROL) injection 1,000 mg (07/12/18 1034)    Assessment & Plan:    1.  Symptomatic hyponatremia with seizure episode: Present on admission.   Psych medication/SSRI on hold and sodium level improved with 3% saline.  Patient also reports rotator cuff injury  for which he was following Dr. Sharol Given as outpatient since April of this year.  May have pain induced SIADH as well.  Appreciate nephrology assistance.  Monitor daily BMPs.  I's and O's.  2.  Word finding difficulty: CT head/MRI head negative for stroke.  Could be psychogenic as patient appears to have normal speech when distracted.  Will consult psychiatry given his psychiatric history and medications being held.  Can consider Depakote for mood stabilization and seizure.   3.  Metabolic encephalopathy: Secondary to problem #1 likely.  Not sure if Keppra contributing to some of the confusion.CT head/MRI head and CSF work-up unrevealing so far.  NMDA receptor antibody levels pending.?  Leptomeningitis given malignancy history.?  Autoimmune encephalitis.  Neurology following and currently on high-dose IV steroids.  4.  Hypertension: On amlodipine lisinopril  5.  Rotator cuff injury/peripheral neuropathy: Reports pain level at 2/10 now.  Continue Voltaren gel and PT/OT evaluation  6.  Hyperlipidemia: Resume statins and fenofibrate  DVT prophylaxis: Lovenox Code Status: Full code Family / Patient Communication: Discussed with patient and also discussed with wife in detail Disposition Plan: Likely home when medically cleared     LOS: 8 days    Time spent:     Guilford Shi, MD [facility] Pager [phone]  If 7PM-7AM, please contact night-coverage [email or web address] Password [facility] 07/12/2018, 5:38 PM

## 2018-07-12 NOTE — Progress Notes (Signed)
[facility] ROUNDING NOTE   Subjective:   No changes  Blood pressure 120/60 temperature 98.2 pulse 87 O2 sats 97% room air  IV Keppra 1 g every 12 hours  IV Solu-Medrol 1 g daily started 07/09/2018  Repeat MRI brain ordered.  MRI 07/06/2018 showed chronic small vessel ischemic changes in the pons no focal cerebellar insult cerebral hemispheres with atrophy focally prominent left parietal gyri.  No mass lesions hemorrhages hydrocephalus subdural collections noted mesial temporal lobes appear symmetric and in the normal limits.  Chest x-ray shows right IJ catheter in SVC   Sodium 136 potassium 3.9 chloride 102 CO2 22 BUN 17 creatinine 1.01 glucose 139 WBC 7.3 hemoglobin 10.5 platelets 201       Objective:  Vital signs in last 24 hours:  Temp:  [97.7 F (36.5 C)-98.3 F (36.8 C)] 98.2 F (36.8 C) (12/14 0936) Pulse Rate:  [80-88] 87 (12/14 0936) Resp:  [15-20] 18 (12/14 0936) BP: (120-158)/(62-89) 120/62 (12/14 0936) SpO2:  [95 %-100 %] 97 % (12/14 0936)  Weight change:  Filed Weights   07/08/18 0500 07/09/18 0500 07/11/18 0422  Weight: 94.7 kg 91.4 kg 86.6 kg    Intake/Output: I/O last 3 completed shifts: In: 882 [P.O.:682; IV Piggyback:200] Out: 1600 [Urine:1600]   Intake/Output this shift:  No intake/output data recorded.  CVS- RRR no murmurs rubs gallops RS- CTA no wheezes rales ABD- BS present soft non-distended EXT-right trans-met to tarsal amputation   Basic Metabolic Panel: Recent Labs  Lab 07/10/18 0241  07/10/18 1922 07/10/18 2328 07/11/18 0339 07/11/18 1051 07/12/18 0248 07/12/18 0816  NA 135   < > 136 137 136 136 136  --   K 3.9  --  3.5 3.4* 3.4*  --  3.9  --   CL 105  --  101 101 100  --  102  --   CO2 22  --  _0 --  22  --   GLUCOSE 162*  --  201* 194* 172*  --  139*  --   BUN 7*  --  _1 --  17  --   CREATININE 0.75  --  0.86 0.98 0.85  --  1.01  --   CALCIUM 8.9  --  9.4 9.3 9.5  --  9.4  --   MG  --    --   --   --   --   --   --  1.8   < > = values in this interval not displayed.    Liver Function Tests: Recent Labs  Lab 07/09/18 0319  AST 17  ALT 23  ALKPHOS 91  BILITOT 0.4  PROT 5.2*  ALBUMIN 2.8*   No results for input(s): LIPASE, AMYLASE in the last 168 hours. No results for input(s): AMMONIA in the last 168 hours.  CBC: Recent Labs  Lab 07/06/18 0431 07/07/18 0435 07/08/18 0501 07/09/18 0319 07/11/18 0339  WBC 4.2 3.6* 4.2 3.4* 7.3  NEUTROABS 3.2 2.8  --   --   --   HGB 12.3* 11.0* 10.9* 10.6* 10.5*  HCT 35.6* 33.2* 32.7* 32.5* 32.4*  MCV 85.2 88.1 88.1 89.0 89.3  PLT 171 151 167 157 201    Cardiac Enzymes: No results for input(s): CKTOTAL, CKMB, CKMBINDEX, TROPONINI in the last 168 hours.  BNP: Invalid input(s): POCBNP  CBG: Recent Labs  Lab 07/06/18 0235 07/09/18 2015  GLUCAP 121* 103*    Microbiology: Results for orders placed or performed  during the hospital encounter of 07/04/18  CSF culture     Status: None   Collection Time: 07/04/18  9:49 PM  Result Value Ref Range Status   Specimen Description CSF  Final   Special Requests NONE  Final   Gram Stain NO WBC SEEN NO ORGANISMS SEEN   Final   Culture   Final    NO GROWTH 3 DAYS Performed at [facility], [address] [place], [place] [address]    Report Status 07/08/2018 FINAL  Final  MRSA PCR Screening     Status: None   Collection Time: 07/04/18 11:54 PM  Result Value Ref Range Status   MRSA by PCR NEGATIVE NEGATIVE Final    Comment:        The GeneXpert MRSA Assay (FDA approved for NASAL specimens only), is one component of a comprehensive MRSA colonization surveillance program. It is not intended to diagnose MRSA infection nor to guide or monitor treatment for MRSA infections. Performed at [facility], [place] [address] [place], [place] [address]   Culture, blood (routine x 2)     Status: None   Collection Time: 07/05/18 12:30 AM  Result Value Ref Range  Status   Specimen Description BLOOD RIGHT ANTECUBITAL  Final   Special Requests   Final    BOTTLES DRAWN AEROBIC ONLY Blood Culture adequate volume   Culture   Final    NO GROWTH 5 DAYS Performed at [facility], [place] [address], [place], [place] [address]    Report Status 07/10/2018 FINAL  Final  Culture, blood (routine x 2)     Status: None   Collection Time: 07/05/18 12:36 AM  Result Value Ref Range Status   Specimen Description BLOOD LEFT ANTECUBITAL  Final   Special Requests   Final    BOTTLES DRAWN AEROBIC ONLY Blood Culture adequate volume   Culture   Final    NO GROWTH 5 DAYS Performed at [facility], [place] [address] [place], [place] [address]    Report Status 07/10/2018 FINAL  Final    Coagulation Studies: No results for input(s): LABPROT, INR in the last 72 hours.  Urinalysis: Recent Labs    07/10/18 2312  COLORURINE YELLOW  LABSPEC 1.017  PHURINE 6.0  GLUCOSEU 50*  HGBUR NEGATIVE  BILIRUBINUR NEGATIVE  KETONESUR NEGATIVE  PROTEINUR NEGATIVE  NITRITE NEGATIVE  LEUKOCYTESUR NEGATIVE      Imaging: Mr Jeri Cos BO Contrast  Result Date: 07/10/2018 CLINICAL DATA:  67 year old male with altered mental status. Multiple episodes of expressive aphasia and seizure-like activity. EXAM: MRI HEAD WITHOUT AND WITH CONTRAST TECHNIQUE: Multiplanar, multiecho pulse sequences of the brain and surrounding structures were obtained without and with intravenous contrast. CONTRAST:  10 milliliters Gadavist COMPARISON:  Brain MRI 07/06/2018 and earlier. FINDINGS: Brain: No restricted diffusion to suggest acute infarction. No midline shift, mass effect, evidence of mass lesion, ventriculomegaly, extra-axial collection or acute intracranial hemorrhage. Cervicomedullary junction and pituitary are within normal limits. Pearline Cables and white matter signal remains within normal limits for age. Sulcal variation or disproportionate cerebral volume loss in the left parietal lobe  redemonstrated with no cortical encephalomalacia. No heterotopia or migrational disorder. No chronic cerebral blood products. No abnormal enhancement identified. No dural thickening. Vascular: Major intracranial vascular flow voids are preserved and stable. The major dural venous sinuses are enhancing and appear patent. Skull and upper cervical spine: Negative visible cervical spine. Normal bone marrow signal. Sinuses/Orbits: Stable and negative. Other:  Mild right mastoid effusion is stable. Negative visible nasopharynx. Visible internal auditory structures appear normal. Scalp and face soft tissues appear negative. IMPRESSION: Continued stable MRI appearance of the brain, largely negative for age. No abnormal enhancement or No acute intracranial abnormality. Electronically Signed   By: Genevie Ann M.D.   On: 07/10/2018 17:12     Medications:   . levETIRAcetam 1,000 mg (07/12/18 9518)  . methylPREDNISolone (SOLU-MEDROL) injection 1,000 mg (07/12/18 1034)   . allopurinol  200 mg Oral q morning - 10a  . amLODipine  10 mg Oral q morning - 10a  . aspirin EC  81 mg Oral Daily  . diclofenac sodium  2 g Topical QID  . docusate sodium  100 mg Oral Daily  . enoxaparin (LOVENOX) injection  40 mg Subcutaneous Q24H  . fenofibrate  160 mg Oral Daily  . lisinopril  20 mg Oral Daily  . nicotine  21 mg Transdermal Daily  . rosuvastatin  10 mg Oral q1800  . traZODone  100 mg Oral QHS   acetaminophen, ALPRAZolam, ondansetron (ZOFRAN) IV, oxyCODONE  Assessment/ Plan:   Hyponatremia.  This could have been related to hypo-volemic hyponatremia on admission.  There was response to IV saline.  Transition to 3% normal saline would make the diagnosis difficult.  Could be an element of SIADH in the setting of encephalopathy.  The etiology of the encephalopathy appears elusive.  Certainly electrolyte abnormalities could be responsible however with return of serum sodium I think this is unlikely.  He is now off 3% saline we  will continue to follow his sodium  Encephalopathy.  Appreciate neurology help Dr. Cheral Marker.  Patient is receiving 1 g of methylprednisolone daily.  Etiological factors could involve vasculitis, infections that could be spirochetal, protozoal, viral, or bacteria, paraneoplastic syndrome, idiopathic.  ANA negative   LOS: [place] _0 _1 :15 PM

## 2018-07-12 NOTE — Progress Notes (Signed)
Patient arrived from 65M to 5N07. RN placed Posey Bed on patient's bed, safety mats on the floor on each side of the bed, and suction canister set up at bedside. Patient non-compliant insisting that he does not need the Posey Bed and calling medical staff "stupid". Patient the states that he wants to dress in a hoodie and pants. RN informed the patient that the pants were acceptable to wear however, she encouraged the patient to keep the hospital gown in order to have unrestricted access to IV sites and identification bands. RN places the bed alarm on the patient and the patient insists that he will purposely get out of bed in order to "work [place] all night" because he states that he does not need a bed alarm. RN informed patient that he cannot get up by himself and that she will keep the bed alarm on the middle setting in order to prevent a fall and fall related injuries. RN prepared the room and spoke with the patient and his wife, Quentin Shorey who is also a [employer], for 45 consecutive minutes.  Later NT goes into the patient's room as the bed alarm went off because the patient is at the edge of the foot of the bed. Patient has also urinated on the bed. NT goes to retrieve fresh linens. NT and RN return to the room.   RN and NT prepare patient's bed with new sheets and reposition the patient at the head of the bed. Patient's wife states that she does not know how the patient ended up at the edge of the foot of the bed.  RN explains to the patient that the bed alarm must remain on at all times and is to not be manipulated by anyone not providing direct care to the patient. RN and NT put the patient's bedside table by the bed and placed the call light within the patient's reach. Patient has been toileted and has all personal belongings within reach. Patient's wife is spending the night.  Charge nurse reported to the patient's room reconfirming the educational importance of remaining in bed to  prevent a fall and fall related injuries.    RN and NT will complete every two hour rounding simultaneously in order to promote team based nursing care.   Nursing will continue to monitor.

## 2018-07-13 ENCOUNTER — Encounter (HOSPITAL_COMMUNITY): Payer: Self-pay | Admitting: *Deleted

## 2018-07-13 DIAGNOSIS — F411 Generalized anxiety disorder: Secondary | ICD-10-CM | POA: Diagnosis present

## 2018-07-13 MED ORDER — TRAZODONE HCL 50 MG PO TABS
50.0000 mg | ORAL_TABLET | Freq: Every day | ORAL | Status: DC
  Administered 2018-07-13 – 2018-07-15 (×3): 50 mg via ORAL
  Filled 2018-07-13 (×3): qty 1
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
## 2018-07-13 NOTE — Progress Notes (Signed)
PROGRESS NOTE    Taylor Elliott  CXK:[id number]  DOB: 30-Dec-1950  DOA: 07/04/2018 PCP: Ann Held, DO  Brief Narrative:   67 year old male with history of renal cell carcinoma status post nephrectomy, prostate cancer status post prostatectomy, hypertension, hyperlipidemia, peripheral neuropathy, anxiety disorder presented to [facility] ED with severe symptomatic hyponatremia with sodium of 113.  He reported episodes of nausea vomiting and diarrhea a few days prior to presentation.He had a CT head done that showed no acute intracranial abnormality. An LP was performed that showed elevated protein with normal cell count.  He was transferred to [facility] and admitted to ICU.  He did have a witnessed seizure episode during neurology evaluation here.  His sodium normalized with hypertonic saline and he is now transferred out to medical floor.  Patient however has had episodes of confusion and agitation especially in the evenings.  There has been some concern for episodic word finding difficulties and EEG testing during these episodes did not show any abnormal activity.  Psychogenic component suspected per neurology.  Patient now on fluid restriction and psych medications have been held in concern for SIADH induced hyponatremia.  CSF work-up has largely been negative, some autoimmune work-up like NMDA receptor antibody still pending.Neurology and nephrology following.  Subjective:  His speech is fluent today. Denies any headaches or visual changes.    Objective: Vitals:   07/12/18 0936 07/12/18 1334 07/12/18 2102 07/13/18 0656  BP: 120/62 128/63 133/62 126/71  Pulse: 87 89 92 75  Resp: 18 18    Temp: 98.2 F (36.8 C) 98.1 F (36.7 C) 98.4 F (36.9 C) 97.9 F (36.6 C)  TempSrc: Oral Oral Oral Oral  SpO2: 97% 98% 98% 97%  Weight:      Height:       No intake or output data in the 24 hours ending 07/13/18 1352 Filed Weights   07/08/18 0500 07/09/18 0500 07/11/18 0422  Weight:  94.7 kg 91.4 kg 86.6 kg    Physical Examination:  General exam: Appears calm and comfortable  Respiratory system: Clear to auscultation. Respiratory effort normal. Cardiovascular system: S1 & S2 heard, RRR. No JVD, murmurs, rubs, gallops or clicks. No pedal edema. Gastrointestinal system: Abdomen is nondistended, soft and nontender. No organomegaly or masses felt. Normal bowel sounds heard. Central nervous system: Alert and oriented. No focal neurological deficits. Extremities: Symmetric 5 x 5 power.  No edema. Skin: No rashes, lesions or ulcers Psychiatry: Judgement and insight appear subnormal. Mood & affect: Impulsive/anxious    Data Reviewed: I have personally reviewed following labs and imaging studies  CBC: Recent Labs  Lab 07/07/18 0435 07/08/18 0501 07/09/18 0319 07/11/18 0339  WBC 3.6* 4.2 3.4* 7.3  NEUTROABS 2.8  --   --   --   HGB 11.0* 10.9* 10.6* 10.5*  HCT 33.2* 32.7* 32.5* 32.4*  MCV 88.1 88.1 89.0 89.3  PLT 151 167 157 970   Basic Metabolic Panel: Recent Labs  Lab 07/10/18 0241  07/10/18 1922 07/10/18 2328 07/11/18 0339 07/11/18 1051 07/12/18 0248 07/12/18 0816  NA 135   < > 136 137 136 136 136  --   K 3.9  --  3.5 3.4* 3.4*  --  3.9  --   CL 105  --  101 101 100  --  102  --   CO2 22  --  23 25 26   --  22  --   GLUCOSE 162*  --  201* 194* 172*  --  139*  --   BUN 7*  --  8 11 13   --  17  --   CREATININE 0.75  --  0.86 0.98 0.85  --  1.01  --   CALCIUM 8.9  --  9.4 9.3 9.5  --  9.4  --   MG  --   --   --   --   --   --   --  1.8   < > = values in this interval not displayed.   GFR: Estimated Creatinine Clearance: 77 mL/min (by C-G formula based on SCr of 1.01 mg/dL). Liver Function Tests: Recent Labs  Lab 07/09/18 0319  AST 17  ALT 23  ALKPHOS 91  BILITOT 0.4  PROT 5.2*  ALBUMIN 2.8*   No results for input(s): LIPASE, AMYLASE in the last 168 hours. No results for input(s): AMMONIA in the last 168 hours. Coagulation Profile: No  results for input(s): INR, PROTIME in the last 168 hours. Cardiac Enzymes: No results for input(s): CKTOTAL, CKMB, CKMBINDEX, TROPONINI in the last 168 hours. BNP (last 3 results) No results for input(s): PROBNP in the last 8760 hours. HbA1C: No results for input(s): HGBA1C in the last 72 hours. CBG: Recent Labs  Lab 07/09/18 2015  GLUCAP 103*   Lipid Profile: No results for input(s): CHOL, HDL, LDLCALC, TRIG, CHOLHDL, LDLDIRECT in the last 72 hours. Thyroid Function Tests: No results for input(s): TSH, T4TOTAL, FREET4, T3FREE, THYROIDAB in the last 72 hours. Anemia Panel: No results for input(s): VITAMINB12, FOLATE, FERRITIN, TIBC, IRON, RETICCTPCT in the last 72 hours. Sepsis Labs: No results for input(s): PROCALCITON, LATICACIDVEN in the last 168 hours.  Recent Results (from the past 240 hour(s))  CSF culture     Status: None   Collection Time: 07/04/18  9:49 PM  Result Value Ref Range Status   Specimen Description CSF  Final   Special Requests NONE  Final   Gram Stain NO WBC SEEN NO ORGANISMS SEEN   Final   Culture   Final    NO GROWTH 3 DAYS Performed at [facility], [address], [place], [place] [address]    Report Status 07/08/2018 FINAL  Final  MRSA PCR Screening     Status: None   Collection Time: 07/04/18 11:54 PM  Result Value Ref Range Status   MRSA by PCR NEGATIVE NEGATIVE Final    Comment:        The GeneXpert MRSA Assay (FDA approved for NASAL specimens only), is one component of a comprehensive MRSA colonization surveillance program. It is not intended to diagnose MRSA infection nor to guide or monitor treatment for MRSA infections. Performed at [facility], [place] [address] [place], [place] [address]   Culture, blood (routine x 2)     Status: None   Collection Time: 07/05/18 12:30 AM  Result Value Ref Range Status   Specimen Description BLOOD RIGHT ANTECUBITAL  Final   Special Requests   Final    BOTTLES DRAWN AEROBIC ONLY  Blood Culture adequate volume   Culture   Final    NO GROWTH 5 DAYS Performed at [facility], [place] [address], [place], [place] [address]    Report Status 07/10/2018 FINAL  Final  Culture, blood (routine x 2)     Status: None   Collection Time: 07/05/18 12:36 AM  Result Value Ref Range Status   Specimen Description BLOOD LEFT ANTECUBITAL  Final   Special Requests   Final  BOTTLES DRAWN AEROBIC ONLY Blood Culture adequate volume   Culture   Final    NO GROWTH 5 DAYS Performed at [facility], [place] [address], [place], [place] [address]    Report Status 07/10/2018 FINAL  Final      Radiology Studies: No results found.  Scheduled Meds: . allopurinol  200 mg Oral q morning - 10a  . amLODipine  10 mg Oral q morning - 10a  . aspirin EC  81 mg Oral Daily  . diclofenac sodium  2 g Topical QID  . docusate sodium  100 mg Oral Daily  . enoxaparin (LOVENOX) injection  40 mg Subcutaneous Q24H  . fenofibrate  160 mg Oral Daily  . lisinopril  20 mg Oral Daily  . nicotine  21 mg Transdermal Daily  . rosuvastatin  10 mg Oral q1800  . traZODone  100 mg Oral QHS   Continuous Infusions: . levETIRAcetam 1,000 mg (07/13/18 0546)  . methylPREDNISolone (SOLU-MEDROL) injection 1,000 mg (07/13/18 1326)    Assessment & Plan:    1.  Symptomatic hyponatremia with seizure episode: Present on admission.   Psych medication/SSRI on hold and sodium level improved with 3% saline.  Patient also reports rotator cuff injury for which he was following Dr. Sharol Given as outpatient since April of this year.  May have pain induced SIADH as well.  Appreciate nephrology assistance.  Monitor daily BMPs.  I's and O's. 07/13/18: -Sodium 136.  Patient's wife had a lot of questions.  She was wondering if too much of oxycodone can cause the hyponatremia.  She is also wanting the patient to get a PET scan to evaluate for malignancy as an etiology for the hyponatremia. -No further seizures.  2.  Word finding  difficulty: CT head/MRI head negative for stroke.  Could be psychogenic as patient appears to have normal speech when distracted.  Will consult psychiatry given his psychiatric history and medications being held.  Can consider Depakote for mood stabilization and seizure.  07/13/18: -Psychiatry consulted.  No history of mood disorder.  His speech is better today.  Patient and his wife deny any prior psychiatric problems although he is on Paxil but low-dose.  3.  Metabolic encephalopathy: Secondary to problem #1 likely.  Not sure if Keppra contributing to some of the confusion.CT head/MRI head and CSF work-up unrevealing so far.  NMDA receptor antibody levels pending.?  Leptomeningitis given malignancy history.?  Autoimmune encephalitis.  Neurology following and currently on high-dose IV steroids. 07/13/18: -His wife is also concerned about the high-dose IV steroids that he is receiving.  Today is the last dose of the IV steroid as far as I can tell.  They are also wanting him to be off the IV Keppra. -No further seizures at this time.  Mental status normal at this time.  Further management as per neurology.  4.  Hypertension: On amlodipine lisinopril 07/13/18: -Continue to monitor blood pressure closely and adjust medications as needed.  5.  Rotator cuff injury/peripheral neuropathy: Reports pain level at 2/10 now.  Continue Voltaren gel and PT/OT evaluation  6.  Hyperlipidemia: Resume statins and fenofibrate 07/13/18: - Continue statins.  DVT prophylaxis: Lovenox Code Status: Full code Family / Patient Communication: Discussed with patient and also discussed with wife in detail Disposition Plan: Likely home when medically cleared     LOS: 9 days    Yaakov Guthrie, MD [facility] Pager on amion  If 7PM-7AM, please contact night-coverage [email or web address] Password [email or web address] 07/13/2018, 1:52 PM

## 2018-07-13 NOTE — Progress Notes (Signed)
[facility] ROUNDING NOTE   Subjective:   No changes.  Emotionally labile  Blood pressure 126/71 pulse 75 temperature 97.5  IV Keppra 1 g every 12 hours  IV Solu-Medrol 1 g daily started 07/09/2018  Repeat MRI brain ordered.  MRI 07/06/2018 showed chronic small vessel ischemic changes in the pons no focal cerebellar insult cerebral hemispheres with atrophy focally prominent left parietal gyri.  No mass lesions hemorrhages hydrocephalus subdural collections noted mesial temporal lobes appear symmetric and in the normal limits.  Chest x-ray shows right IJ catheter in SVC   Sodium 136 potassium 3.9 chloride 102 CO2 22 glucose 139 BUN 17 creatinine 1.0 calcium 9.4 WBC 7.3 hemoglobin 10.5 platelets 201 ANA negative ANCA negative myeloperoxidase negative.       Objective:  Vital signs in last 24 hours:  Temp:  [97.9 F (36.6 C)-98.4 F (36.9 C)] 97.9 F (36.6 C) (12/15 0656) Pulse Rate:  [75-92] 75 (12/15 0656) Resp:  [18] 18 (12/14 1334) BP: (126-133)/(62-71) 126/71 (12/15 0656) SpO2:  [97 %-98 %] 97 % (12/15 0656)  Weight change:  Filed Weights   07/08/18 0500 07/09/18 0500 07/11/18 0422  Weight: 94.7 kg 91.4 kg 86.6 kg    Intake/Output: No intake/output data recorded.   Intake/Output this shift:  No intake/output data recorded.  CVS- RRR no murmurs rubs gallops RS- CTA no wheezes rales ABD- BS present soft non-distended EXT-right trans-met to tarsal amputation   Basic Metabolic Panel: Recent Labs  Lab 07/10/18 0241  07/10/18 1922 07/10/18 2328 07/11/18 0339 07/11/18 1051 07/12/18 0248 07/12/18 0816  NA 135   < > 136 137 136 136 136  --   K 3.9  --  3.5 3.4* 3.4*  --  3.9  --   CL 105  --  101 101 100  --  102  --   CO2 22  --  '23 25 26  '$ --  22  --   GLUCOSE 162*  --  201* 194* 172*  --  139*  --   BUN 7*  --  '8 11 13  '$ --  17  --   CREATININE 0.75  --  0.86 0.98 0.85  --  1.01  --   CALCIUM 8.9  --  9.4 9.3 9.5  --  9.4  --   MG  --    --   --   --   --   --   --  1.8   < > = values in this interval not displayed.    Liver Function Tests: Recent Labs  Lab 07/09/18 0319  AST 17  ALT 23  ALKPHOS 91  BILITOT 0.4  PROT 5.2*  ALBUMIN 2.8*   No results for input(s): LIPASE, AMYLASE in the last 168 hours. No results for input(s): AMMONIA in the last 168 hours.  CBC: Recent Labs  Lab 07/07/18 0435 07/08/18 0501 07/09/18 0319 07/11/18 0339  WBC 3.6* 4.2 3.4* 7.3  NEUTROABS 2.8  --   --   --   HGB 11.0* 10.9* 10.6* 10.5*  HCT 33.2* 32.7* 32.5* 32.4*  MCV 88.1 88.1 89.0 89.3  PLT 151 167 157 201    Cardiac Enzymes: No results for input(s): CKTOTAL, CKMB, CKMBINDEX, TROPONINI in the last 168 hours.  BNP: Invalid input(s): POCBNP  CBG: Recent Labs  Lab 07/09/18 2015  GLUCAP 103*    Microbiology: Results for orders placed or performed during the hospital encounter of 07/04/18  CSF culture     Status:  None   Collection Time: 07/04/18  9:49 PM  Result Value Ref Range Status   Specimen Description CSF  Final   Special Requests NONE  Final   Gram Stain NO WBC SEEN NO ORGANISMS SEEN   Final   Culture   Final    NO GROWTH 3 DAYS Performed at [facility], [address] [place], [place] [address]    Report Status 07/08/2018 FINAL  Final  MRSA PCR Screening     Status: None   Collection Time: 07/04/18 11:54 PM  Result Value Ref Range Status   MRSA by PCR NEGATIVE NEGATIVE Final    Comment:        The GeneXpert MRSA Assay (FDA approved for NASAL specimens only), is one component of a comprehensive MRSA colonization surveillance program. It is not intended to diagnose MRSA infection nor to guide or monitor treatment for MRSA infections. Performed at [facility], [place] [address], [place], [place] [address]   Culture, blood (routine x 2)     Status: None   Collection Time: 07/05/18 12:30 AM  Result Value Ref Range Status   Specimen Description BLOOD RIGHT ANTECUBITAL  Final    Special Requests   Final    BOTTLES DRAWN AEROBIC ONLY Blood Culture adequate volume   Culture   Final    NO GROWTH 5 DAYS Performed at [facility], [place] [address], [place], [place] [address]    Report Status 07/10/2018 FINAL  Final  Culture, blood (routine x 2)     Status: None   Collection Time: 07/05/18 12:36 AM  Result Value Ref Range Status   Specimen Description BLOOD LEFT ANTECUBITAL  Final   Special Requests   Final    BOTTLES DRAWN AEROBIC ONLY Blood Culture adequate volume   Culture   Final    NO GROWTH 5 DAYS Performed at [facility], [place] [address], [place], [place] [address]    Report Status 07/10/2018 FINAL  Final    Coagulation Studies: No results for input(s): LABPROT, INR in the last 72 hours.  Urinalysis: Recent Labs    07/10/18 2312  COLORURINE YELLOW  LABSPEC 1.017  PHURINE 6.0  GLUCOSEU 50*  HGBUR NEGATIVE  BILIRUBINUR NEGATIVE  KETONESUR NEGATIVE  PROTEINUR NEGATIVE  NITRITE NEGATIVE  LEUKOCYTESUR NEGATIVE      Imaging: No results found.   Medications:   . levETIRAcetam 1,000 mg (07/13/18 0546)  . methylPREDNISolone (SOLU-MEDROL) injection 1,000 mg (07/12/18 1034)   . allopurinol  200 mg Oral q morning - 10a  . amLODipine  10 mg Oral q morning - 10a  . aspirin EC  81 mg Oral Daily  . diclofenac sodium  2 g Topical QID  . docusate sodium  100 mg Oral Daily  . enoxaparin (LOVENOX) injection  40 mg Subcutaneous Q24H  . fenofibrate  160 mg Oral Daily  . lisinopril  20 mg Oral Daily  . nicotine  21 mg Transdermal Daily  . rosuvastatin  10 mg Oral q1800  . traZODone  100 mg Oral QHS   acetaminophen, ALPRAZolam, ondansetron (ZOFRAN) IV, oxyCODONE  Assessment/ Plan:   Hyponatremia.  This could have been related to hypo-volemic hyponatremia on admission.  There was response to IV saline.  Transition to 3% normal saline would make the diagnosis difficult.  Could be an element of SIADH in the setting of encephalopathy.  The  etiology of the encephalopathy appears elusive.  Certainly electrolyte abnormalities could be responsible  however with return of serum sodium I think this is unlikely.  He is now off 3% saline we will continue to follow his sodium.  This thankfully does not appear to be an issue anymore.  Continue with current management plan and follow-up with psychiatry and neurology  Encephalopathy.  Appreciate neurology help Dr. Cheral Marker.  Patient is receiving 1 g of methylprednisolone daily.  Etiological factors could involve vasculitis, infections that could be spirochetal, protozoal, viral, or bacteria, paraneoplastic syndrome, idiopathic.  ANA negative, ANCA negative anti-GBM negative.   LOS: [place] '@TODAY''@12'$ :04 PM

## 2018-07-13 NOTE — Plan of Care (Signed)
  Problem: Coping: Goal: Level of anxiety will decrease Outcome: Progressing   Problem: Pain Managment: Goal: General experience of comfort will improve Outcome: Progressing   Problem: Skin Integrity: Goal: Risk for impaired skin integrity will decrease Outcome: Progressing   

## 2018-07-14 ENCOUNTER — Telehealth (INDEPENDENT_AMBULATORY_CARE_PROVIDER_SITE_OTHER): Payer: Self-pay | Admitting: Orthopedic Surgery

## 2018-07-14 DIAGNOSIS — R569 Unspecified convulsions: Secondary | ICD-10-CM

## 2018-07-14 LAB — CBC
HCT: 32.8 % — ABNORMAL LOW (ref 39.0–52.0)
Hemoglobin: 10.7 g/dL — ABNORMAL LOW (ref 13.0–17.0)
MCH: 29.4 pg (ref 26.0–34.0)
MCHC: 32.6 g/dL (ref 30.0–36.0)
MCV: 90.1 fL (ref 80.0–100.0)
Platelets: 229 10*3/uL (ref 150–400)
RBC: 3.64 MIL/uL — ABNORMAL LOW (ref 4.22–5.81)
RDW: 12.9 % (ref 11.5–15.5)
WBC: 6.3 10*3/uL (ref 4.0–10.5)
nRBC: 0 % (ref 0.0–0.2)

## 2018-07-14 LAB — BASIC METABOLIC PANEL
Anion gap: 12 (ref 5–15)
BUN: 28 mg/dL — ABNORMAL HIGH (ref 8–23)
CO2: 29 mmol/L (ref 22–32)
Calcium: 9 mg/dL (ref 8.9–10.3)
Chloride: 99 mmol/L (ref 98–111)
Creatinine, Ser: 1.1 mg/dL (ref 0.61–1.24)
GFR calc Af Amer: >60 mL/min (ref >60)
GFR calc non Af Amer: >60 mL/min (ref >60)
Glucose, Bld: 228 mg/dL — ABNORMAL HIGH (ref 70–99)
Potassium: 3.9 mmol/L (ref 3.5–5.1)
Sodium: 140 mmol/L (ref 135–145)

## 2018-07-14 LAB — HEMOGLOBIN A1C
Hgb A1c MFr Bld: 5.5 % (ref 4.8–5.6)
Mean Plasma Glucose: 111.15 mg/dL

## 2018-07-14 LAB — BRAIN NATRIURETIC PEPTIDE: B Natriuretic Peptide: 186.9 pg/mL — ABNORMAL HIGH (ref 0.0–100.0)

## 2018-07-14 LAB — OLIGOCLONAL BANDS, CSF + SERM

## 2018-07-14 LAB — GLUCOSE, CAPILLARY: Glucose-Capillary: 216 mg/dL — ABNORMAL HIGH (ref 70–99)

## 2018-07-14 MED ORDER — INSULIN ASPART 100 UNIT/ML ~~LOC~~ SOLN: 0.0000 [IU] | Freq: Three times a day (TID) | SUBCUTANEOUS | Status: DC

## 2018-07-14 MED ORDER — LEVETIRACETAM 500 MG PO TABS: 1000.0000 mg | ORAL_TABLET | Freq: Two times a day (BID) | ORAL | Status: DC

## 2018-07-14 MED ORDER — HYDRALAZINE HCL 20 MG/ML IJ SOLN
10.0000 mg | Freq: Four times a day (QID) | INTRAMUSCULAR | Status: DC | PRN
  Administered 2018-07-14: 10 mg via INTRAVENOUS
  Filled 2018-07-14: qty 1

## 2018-07-14 NOTE — Telephone Encounter (Signed)
Patient called wanting to know who Dr. Sharol Given would recommend for [facility] PT.  CB#[phone].  Thank you.

## 2018-07-14 NOTE — Progress Notes (Signed)
Physical Therapy Treatment Patient Details Name: Taylor Elliott MRN: [id number] DOB: 07/05/1951 Today's Date: 07/14/2018    History of Present Illness Pt [place] for higher level mgt of hyponatremia on 12/07 after week bout of worsening vomiting and diarrhea. Pt also with seizure/stroke like symptoms with further workup being done at time of PT evaluation 07/09/2018. PMH includes: prostate and kidney CA, peripheral neuropathy of BLE, s/p L trans met amputation, HLD, HTN.     PT Comments    Continuing work on functional mobility and activity tolerance;  Able to significantly increase ambulation distance today; continued heavy reliance on RW for steadiness with amb   Follow Up Recommendations  Home health PT;Supervision/Assistance - 24 hour     Equipment Recommendations  Rolling walker with 5" wheels    Recommendations for Other Services OT consult     Precautions / Restrictions Precautions Required Braces or Orthoses: Other Brace Other Brace: L AFO Restrictions LLE Weight Bearing: Weight bearing as tolerated    Mobility  Bed Mobility Overal bed mobility: Modified Independent                Transfers Overall transfer level: Needs assistance Equipment used: Rolling walker (2 wheeled) Transfers: Sit to/from Stand Sit to Stand: Min guard         General transfer comment: Min guard to stand for safety  Ambulation/Gait Ambulation/Gait assistance: Min guard Gait Distance (Feet): 200 Feet(Greater than) Assistive device: Rolling walker (2 wheeled) Gait Pattern/deviations: Step-through pattern;Decreased step length - right;Decreased step length - left Gait velocity: decreased   General Gait Details: Noting slightly flexed hips with amb with RW, able to correct briefly with cues, but sinks back into that slight hip flexion within approx 1 minute; Noted slight incr in shakiness with fatigue as he walked greater distance; He was so happy to walk in  hallways   Stairs             Wheelchair Mobility    Modified Rankin (Stroke Patients Only)       Balance     Sitting balance-Leahy Scale: Fair       Standing balance-Leahy Scale: Poor Standing balance comment: Heavy reliance on RW                            Cognition Arousal/Alertness: Awake/alert Behavior During Therapy: WFL for tasks assessed/performed Overall Cognitive Status: Within Functional Limits for tasks assessed(for simple mobility)                                        Exercises      General Comments        Pertinent Vitals/Pain Pain Assessment: Faces Faces Pain Scale: Hurts a little bit Pain Location: Reports a little bit of back pain Pain Descriptors / Indicators: Dull Pain Intervention(s): Monitored during session    Home Living                      Prior Function            PT Goals (current goals can now be found in the care plan section) Acute Rehab PT Goals Patient Stated Goal: walk again, figure out what is going on with his speech and cognition  PT Goal Formulation: With patient/family Time For Goal Achievement: 07/23/18 Potential to Achieve Goals: Fair  Progress towards PT goals: Progressing toward goals(Will consider advancing goals next session)    Frequency    Min 3X/week      PT Plan Current plan remains appropriate    Co-evaluation              AM-PAC PT "6 Clicks" Mobility   Outcome Measure  Help needed turning from your back to your side while in a flat bed without using bedrails?: A Little Help needed moving from lying on your back to sitting on the side of a flat bed without using bedrails?: A Little Help needed moving to and from a bed to a chair (including a wheelchair)?: A Little Help needed standing up from a chair using your arms (e.g., wheelchair or bedside chair)?: A Little Help needed to walk in hospital room?: A Little Help needed climbing 3-5 steps  with a railing? : A Lot 6 Click Score: 17    End of Session Equipment Utilized During Treatment: Gait belt Activity Tolerance: Patient tolerated treatment well Patient left: in chair;with call bell/phone within reach;with family/visitor present;Other (comment)(Dr. [name] in room) Nurse Communication: Mobility status PT Visit Diagnosis: Unsteadiness on feet (R26.81)     Time: 5427-0623 PT Time Calculation (min) (ACUTE ONLY): 27 min  Charges:  $Gait Training: 23-37 mins                     Roney Marion, [place]  Acute Rehabilitation Services Pager [phone] Office [place] 07/14/2018, 3:53 PM

## 2018-07-14 NOTE — Care Management Note (Signed)
Case Management Note  Patient Details  Name: Taylor Elliott MRN: [id number] Date of Birth: [birth year]  Subjective/Objective:     Pt admitted hypokalemia                 Action/Plan:  PTA Independent from home with wife - lately needing to use walker for mobility assitance.  Pt has PCP and denied barriers with paying for medications.  Pt interested in [facility], CM provided medicare.gov HH and left it with pt and wife - CM will follow up at a later time.  CM also provided copy of [place] list on shadow chart   Expected Discharge Date:                  Expected Discharge Plan:  Home/Self Care(from home)  In-House Referral:     Discharge planning Services  CM Consult  Post Acute Care Choice:    Choice offered to:     DME Arranged:    DME Agency:     HH Arranged:    HH Agency:     Status of Service:     If discussed at [employer], dates discussed:    Additional Comments: 07/14/2018 CM followed up with pt and wife - they still haven't made a choice on HH.  Pt informed CM that he wants to reach out to Dr Sharol Given and get a recommendation on [facility] agency.  CM will follow up [name], RN 07/14/2018, 3:59 PM

## 2018-07-14 NOTE — Plan of Care (Signed)
  Problem: Activity: Goal: Risk for activity intolerance will decrease Outcome: Progressing   

## 2018-07-14 NOTE — Progress Notes (Signed)
PROGRESS NOTE  Taylor Elliott ZOX:[id number] DOB: August 22, 1950 DOA: 07/04/2018 PCP: Ann Held, DO  HPI/Recap of past 44 hours: 67 year old male with history of renal cell carcinoma status post nephrectomy, prostate cancer status post prostatectomy, hypertension, hyperlipidemia, peripheral neuropathy, anxiety disorder presented to [facility] ED with severe symptomatic hyponatremia with sodium of 113.  He reported episodes of nausea vomiting and diarrhea a few days prior to presentation.He had a CT head done that showed no acute intracranial abnormality. An LP was performed that showed elevated protein with normal cell count.  He was transferred to [facility] and admitted to ICU on 12/7.    He did have a witnessed seizure episode during neurology evaluation here.  His sodium normalized with hypertonic saline and he is now transferred out to medical floor.  Patient however has had episodes of confusion and agitation especially in the evenings.  There has been some concern for episodic word finding difficulties and EEG testing during these episodes did not show any abnormal activity.  Psychogenic component suspected per neurology.  Patient initially on fluid restriction and psych medications have been held in concern for SIADH induced hyponatremia.    Patient received pulse dose steroids.  CSF work-up has largely been negative, some autoimmune work-up like NMDA receptor antibody still pending.Neurology and nephrology were following.  Patient now much improved.  Off of fluid restriction.  On liberalized diet.  Off of steroids since 12/15.  Sodium today at 140.  Seen by psychiatry who recommends PRN Xanax only for anxiety.  Neurology feels initially presented with likely true seizures secondary to hyponatremia.  He subsequent issues with stuttering and word finding which get worse as the day progresses may be more conversion disorder/pseudoseizures.  Seen by PT who are recommending home health  PT.  Assessment/Plan: Principal Problem:   Generalized anxiety disorder/depression: As above, treating with as needed Xanax only.  He has had some odd presentation of word finding which may be more conversion disorder, because it appears to get worse as the day goes on.  Does not appear to be affected by medication as he tried to stay off of his Xanax today which might have made him more anxious and thus have the symptoms.  Provided reassurance.  Hyperglycemia: Checked A1c, normal.  Likely in the setting of recent pulse dose steroids.  Insulin sliding scale.  Active Problems:   Prostate cancer [facility]): Stable, followed by urology   Renal neoplasm    Ulcer of right foot limited to breakdown of skin ([place])   Essential hypertension: Blood pressures have since started to be more elevated, even with his home medications restarted.  Have added as needed hydralazine.  Checking BNP and if elevated, will start diuretic watching sodium closely and would check echocardiogram.  Echocardiogram from several years ago noted no evidence of diastolic or systolic dysfunction.    Hyperlipidemia LDL goal <100: Stable, continue statin    Acute hyponatremia: Unclear etiology.  Patient had some component of SIADH, although I suspect initially the triggering point was large amount of water intake on a daily basis and then developed what may have been a gastroenteritis leading to hypovolemic hyponatremia.  I have told him it is now okay to start drinking water again although not excessively.  Episodes of seizures: As per neurology, no evidence on EEG during initial episodes, but these were done after seizure-like episodes occurred.  Triggering factor may have been severe hyponatremia, which has since resolved and improved.  He may  now be having occasional pseudoseizures as above.  He initially was placed on Keppra.  Patient hoping to be off of Keppra and since we think that triggering event was hyponatremia which he is no  longer having, will watch him off of Keppra.  Keppra stopped after morning dose 12/16.    Impingement syndrome of right shoulder   Code Status: Full code  Family Communication: Wife at the bedside  Disposition Plan: Home once blood pressure stable (and we are sure he is not volume overloaded), sodium remains stable now that we have allowed for taking water again and no further true seizures that we have stopped his Keppra.  Discussed with the wife and potential discharge for Wednesday 12/18 with home health PT.   Consultants:  Neurology  Nephrology  Critical care  Psychiatry  Procedures:  EEG done 12/10: No evidence of seizures noted.  Antimicrobials:  None  DVT prophylaxis: Lovenox   Objective: Vitals:   07/14/18 0600 07/14/18 1246  BP: 131/64 (!) 172/100  Pulse: 83 89  Resp:  18  Temp: 97.6 F (36.4 C) 98.3 F (36.8 C)  SpO2: 96% 100%    Intake/Output Summary (Last 24 hours) at 07/14/2018 1805 Last data filed at 07/14/2018 0744 Gross per 24 hour  Intake 240 ml  Output -  Net 240 ml   Filed Weights   07/09/18 0500 07/11/18 0422 07/14/18 0500  Weight: 91.4 kg 86.6 kg 98.3 kg   Body mass index is 32.95 kg/m.  Exam:   General: Alert and oriented x3, no acute distress  HEENT: Cephalic atraumatic, mucous membranes are moist  Neck: Supple, no JVD  Cardiovascular: Regular rate and rhythm, S1-S2  Respiratory: Clear to auscultation bilaterally  Abdomen: Soft, nontender, nondistended, positive bowel sounds  Musculoskeletal: No clubbing or cyanosis, 1+ pitting edema from the knees down bilaterally  Skin: No skin breaks, tears or lesions  Psychiatry: Some stuttering and word finding difficulties, gets anxious at times  Neuro: As above, otherwise no focal deficits   Data Reviewed: CBC: Recent Labs  Lab 07/08/18 0501 07/09/18 0319 07/11/18 0339 07/14/18 0316  WBC 4.2 3.4* 7.3 6.3  HGB 10.9* 10.6* 10.5* 10.7*  HCT 32.7* 32.5* 32.4* 32.8*   MCV 88.1 89.0 89.3 90.1  PLT 167 157 201 128   Basic Metabolic Panel: Recent Labs  Lab 07/10/18 1922 07/10/18 2328 07/11/18 0339 07/11/18 1051 07/12/18 0248 07/12/18 0816 07/14/18 0316  NA 136 137 136 136 136  --  140  K 3.5 3.4* 3.4*  --  3.9  --  3.9  CL 101 101 100  --  102  --  99  CO2 23 25 26   --  22  --  29  GLUCOSE 201* 194* 172*  --  139*  --  228*  BUN 8 11 13   --  17  --  28*  CREATININE 0.86 0.98 0.85  --  1.01  --  1.10  CALCIUM 9.4 9.3 9.5  --  9.4  --  9.0  MG  --   --   --   --   --  1.8  --    GFR: Estimated Creatinine Clearance: 75.1 mL/min (by C-G formula based on SCr of 1.1 mg/dL). Liver Function Tests: Recent Labs  Lab 07/09/18 0319  AST 17  ALT 23  ALKPHOS 91  BILITOT 0.4  PROT 5.2*  ALBUMIN 2.8*   No results for input(s): LIPASE, AMYLASE in the last 168 hours. No results for input(s): AMMONIA in  the last 168 hours. Coagulation Profile: No results for input(s): INR, PROTIME in the last 168 hours. Cardiac Enzymes: No results for input(s): CKTOTAL, CKMB, CKMBINDEX, TROPONINI in the last 168 hours. BNP (last 3 results) No results for input(s): PROBNP in the last 8760 hours. HbA1C: Recent Labs    07/14/18 0308  HGBA1C 5.5   CBG: Recent Labs  Lab 07/09/18 2015  GLUCAP 103*   Lipid Profile: No results for input(s): CHOL, HDL, LDLCALC, TRIG, CHOLHDL, LDLDIRECT in the last 72 hours. Thyroid Function Tests: No results for input(s): TSH, T4TOTAL, FREET4, T3FREE, THYROIDAB in the last 72 hours. Anemia Panel: No results for input(s): VITAMINB12, FOLATE, FERRITIN, TIBC, IRON, RETICCTPCT in the last 72 hours. Urine analysis:    Component Value Date/Time   COLORURINE YELLOW 07/10/2018 2312   APPEARANCEUR CLEAR 07/10/2018 2312   LABSPEC 1.017 07/10/2018 2312   PHURINE 6.0 07/10/2018 2312   GLUCOSEU 50 (A) 07/10/2018 2312   HGBUR NEGATIVE 07/10/2018 2312   BILIRUBINUR NEGATIVE 07/10/2018 2312   KETONESUR NEGATIVE 07/10/2018 2312    PROTEINUR NEGATIVE 07/10/2018 2312   NITRITE NEGATIVE 07/10/2018 2312   LEUKOCYTESUR NEGATIVE 07/10/2018 2312   Sepsis Labs: @LABRCNTIP (procalcitonin:4,lacticidven:4)  ) Recent Results (from the past 240 hour(s))  CSF culture     Status: None   Collection Time: 07/04/18  9:49 PM  Result Value Ref Range Status   Specimen Description CSF  Final   Special Requests NONE  Final   Gram Stain NO WBC SEEN NO ORGANISMS SEEN   Final   Culture   Final    NO GROWTH 3 DAYS Performed at [facility], [place] [address] [place], [place] [address]    Report Status 07/08/2018 FINAL  Final  MRSA PCR Screening     Status: None   Collection Time: 07/04/18 11:54 PM  Result Value Ref Range Status   MRSA by PCR NEGATIVE NEGATIVE Final    Comment:        The GeneXpert MRSA Assay (FDA approved for NASAL specimens only), is one component of a comprehensive MRSA colonization surveillance program. It is not intended to diagnose MRSA infection nor to guide or monitor treatment for MRSA infections. Performed at [facility], [place] [address], [place], [place] [address]   Culture, blood (routine x 2)     Status: None   Collection Time: 07/05/18 12:30 AM  Result Value Ref Range Status   Specimen Description BLOOD RIGHT ANTECUBITAL  Final   Special Requests   Final    BOTTLES DRAWN AEROBIC ONLY Blood Culture adequate volume   Culture   Final    NO GROWTH 5 DAYS Performed at [facility], [place] [address] [place], [place] [address]    Report Status 07/10/2018 FINAL  Final  Culture, blood (routine x 2)     Status: None   Collection Time: 07/05/18 12:36 AM  Result Value Ref Range Status   Specimen Description BLOOD LEFT ANTECUBITAL  Final   Special Requests   Final    BOTTLES DRAWN AEROBIC ONLY Blood Culture adequate volume   Culture   Final    NO GROWTH 5 DAYS Performed at [facility], [place] [address], [place], [place] [address]    Report Status 07/10/2018 FINAL  Final       Studies: No results found.  Scheduled Meds: . allopurinol  200 mg Oral q morning - 10a  . amLODipine  10 mg Oral q morning - 10a  .  aspirin EC  81 mg Oral Daily  . diclofenac sodium  2 g Topical QID  . docusate sodium  100 mg Oral Daily  . enoxaparin (LOVENOX) injection  40 mg Subcutaneous Q24H  . fenofibrate  160 mg Oral Daily  . [START ON 07/15/2018] insulin aspart  0-9 Units Subcutaneous TID WC  . lisinopril  20 mg Oral Daily  . nicotine  21 mg Transdermal Daily  . rosuvastatin  10 mg Oral q1800  . traZODone  50 mg Oral QHS    Continuous Infusions:   LOS: 10 days     Annita Brod, MD [facility]  To reach me or the doctor on call, go to: [email or web address] Password [facility]  07/14/2018, 6:05 PM

## 2018-07-15 LAB — BASIC METABOLIC PANEL
Anion gap: 11 (ref 5–15)
BUN: 25 mg/dL — ABNORMAL HIGH (ref 8–23)
CO2: 29 mmol/L (ref 22–32)
Calcium: 9.2 mg/dL (ref 8.9–10.3)
Chloride: 98 mmol/L (ref 98–111)
Creatinine, Ser: 1.08 mg/dL (ref 0.61–1.24)
GFR calc Af Amer: >60 mL/min (ref >60)
GFR calc non Af Amer: >60 mL/min (ref >60)
Glucose, Bld: 74 mg/dL (ref 70–99)
Potassium: 3.7 mmol/L (ref 3.5–5.1)
Sodium: 138 mmol/L (ref 135–145)

## 2018-07-15 LAB — GLUCOSE, CAPILLARY
Glucose-Capillary: 80 mg/dL (ref 70–99)
Glucose-Capillary: 76 mg/dL (ref 70–99)
Glucose-Capillary: 119 mg/dL — ABNORMAL HIGH (ref 70–99)
Glucose-Capillary: 188 mg/dL — ABNORMAL HIGH (ref 70–99)

## 2018-07-15 LAB — IGG CSF INDEX
Albumin CSF-mCnc: 79 mg/dL — ABNORMAL HIGH (ref 11–48)
Albumin: 4.1 g/dL (ref 3.6–4.8)
CSF IgG Index: 0.5 (ref 0.0–0.7)
IgG (Immunoglobin G), Serum: 657 mg/dL — ABNORMAL LOW (ref 700–1600)
IgG, CSF: 5.9 mg/dL (ref 0.0–8.6)
IgG/Alb Ratio, CSF: 0.07 (ref 0.00–0.25)

## 2018-07-15 NOTE — Telephone Encounter (Signed)
I called and advised pt that we do refer pts to [facility] and he states that this was listed as a participating provider with his insurance. He will call if he has an any other questions.

## 2018-07-15 NOTE — Plan of Care (Signed)
  Problem: Activity: Goal: Risk for activity intolerance will decrease Outcome: Progressing   

## 2018-07-15 NOTE — Progress Notes (Signed)
PROGRESS NOTE    Taylor Elliott  [name] DOB: April 03, 1951 DOA: 07/04/2018 PCP: Ann Held, DO   Brief Narrative: Patient is a 67 year old male with past medical history of renal cell carcinoma status post nephrectomy,, prostate cancer status post prostatectomy, hypertension, hyperlipidemia, peripheral neuropathy, anxiety disorder presented to [facility] ED with severe symptomatic hyponatremia with sodium of 113. He reported episodes of nausea vomiting and diarrhea a few days prior to presentation.He had a CT head done that showed no acute intracranial abnormality. An LP was performed that showed elevated protein with normal cell count. He was transferred to [facility] and admitted to ICU on 12/7.  He did have a witnessed seizure episode during neurology evaluation here. His sodium normalized with hypertonic saline and he is now transferred out to medical floor. Patient however has had episodes of confusion and agitation especially in the evenings. There has been some concern for episodic word finding difficulties and EEG testing during these episodes did not show any abnormal activity. Psychogenic component suspected per neurology. Patient initially on fluid restriction and psych medications have been held in concern for SIADH induced hyponatremia. Patient received pulse dose steroids.  CSF work-up has largely been negative, some autoimmune work-up like NMDA receptor antibody still pending.Neurology and nephrology were following. Patient now much improved.  Off of fluid restriction.  On liberalized diet.  Off of steroids since 12/15.  Sodium today at 140.  Seen by psychiatry who recommends PRN Xanax only for anxiety.  Neurology feels initially presented with likely true seizures secondary to hyponatremia.  He subsequent issues with stuttering and word finding which get worse as the day progresses may be more conversion disorder/pseudoseizures.  Seen by PT who are recommending  .  12/17: Patient seen and examined the bedside this morning.  Remains very comfortable.  He was still slightly stuttering . I discussed extensively at the bedside with the patient and his wife that he is stable for discharge today.  Patient's wife adamantly denying the discharge and wants to check sodium level today and tomorrow.  Assessment & Plan:   Principal Problem:   Generalized anxiety disorder Active Problems:   Prostate cancer ([place])   Renal neoplasm   Ulcer of right foot limited to breakdown of skin ([facility])   Essential hypertension   Hyperlipidemia LDL goal <100   Acute hyponatremia   Impingement syndrome of right shoulder   Seizures ([facility])   Generalized anxiety disorder/depression: As above, treating with as needed Xanax only.  He has had some odd presentation of word finding which may be more conversion disorder, because it appears to get worse as the day goes on.  Does not appear to be affected by medication as he tried to stay off of his Xanax today which might have made him more anxious and thus have the symptoms.    Hyperglycemia: Checked A1c, normal.  Likely in the setting of recent pulse dose steroids.  Insulin sliding scale.    Essential hypertension: Blood pressures stable today.  Have added as needed hydralazine.    Echocardiogram from several years ago noted no evidence of diastolic or systolic dysfunction.    Hyperlipidemia LDL goal <100: Stable, continue statin    Acute hyponatremia: Unclear etiology.  Patient had some component of SIADH, although I suspect initially the triggering point was large amount of water intake on a daily basis and then developed what may have been a gastroenteritis leading to hypovolemic hyponatremia. Last sodium was 140.  Episodes of  seizures: As per neurology, no evidence on EEG during initial episodes, but these were done after seizure-like episodes occurred.  Triggering factor may have been severe hyponatremia, which has since  resolved and improved.  He may now be having occasional pseudoseizures as above.  He initially was placed on Keppra.  Patient hoping to be off of Keppra and since we think that triggering event was hyponatremia which he is no longer having, will watch him off of Keppra.  Keppra stopped after morning dose 12/16.          DVT prophylaxis: Lovenox Code Status: Full Family Communication: Discussed extensively with wife Disposition Plan: Home with home health tomorrow   Consultants: Neurology, nephrology, critical care, psychiatry  Procedures: EEG  Antimicrobials: None  Subjective:  Patient seen and examined at the bedside this morning.  Remains hemodynamically stable.  Comfortable.  Still has some slow speech and stuttering.  Objective: Vitals:   07/14/18 1246 07/14/18 1847 07/15/18 0500 07/15/18 1130  BP: (!) 172/100 (!) 166/87  127/81  Pulse: 89   93  Resp: 18   17  Temp: 98.3 F (36.8 C)   98.7 F (37.1 C)  TempSrc: Oral   Oral  SpO2: 100%   99%  Weight:   97.6 kg   Height:       No intake or output data in the 24 hours ending 07/15/18 1210 Filed Weights   07/11/18 0422 07/14/18 0500 07/15/18 0500  Weight: 86.6 kg 98.3 kg 97.6 kg    Examination:  General exam: Appears calm and comfortable ,Not in distress,average built HEENT:PERRL,Oral mucosa moist, Ear/Nose normal on gross exam Respiratory system: Bilateral equal air entry, normal vesicular breath sounds, no wheezes or crackles  Cardiovascular system: S1 & S2 heard, RRR. No JVD, murmurs, rubs, gallops or clicks. No pedal edema. Gastrointestinal system: Abdomen is nondistended, soft and nontender. No organomegaly or masses felt. Normal bowel sounds heard. Central nervous system: Alert and oriented. No focal neurological deficits.  Stuttering and word finding difficulties Extremities: No edema, no clubbing ,no cyanosis, distal peripheral pulses palpable. Skin: No rashes, lesions or ulcers,no icterus ,no  pallor MSK: Normal muscle bulk,tone ,power Psychiatry: Judgement and insight appear normal. Mood & affect appropriate.     Data Reviewed: I have personally reviewed following labs and imaging studies  CBC: Recent Labs  Lab 07/09/18 0319 07/11/18 0339 07/14/18 0316  WBC 3.4* 7.3 6.3  HGB 10.6* 10.5* 10.7*  HCT 32.5* 32.4* 32.8*  MCV 89.0 89.3 90.1  PLT 157 201 270   Basic Metabolic Panel: Recent Labs  Lab 07/10/18 1922 07/10/18 2328 07/11/18 0339 07/11/18 1051 07/12/18 0248 07/12/18 0816 07/14/18 0316  NA 136 137 136 136 136  --  140  K 3.5 3.4* 3.4*  --  3.9  --  3.9  CL 101 101 100  --  102  --  99  CO2 23 25 26   --  22  --  29  GLUCOSE 201* 194* 172*  --  139*  --  228*  BUN 8 11 13   --  17  --  28*  CREATININE 0.86 0.98 0.85  --  1.01  --  1.10  CALCIUM 9.4 9.3 9.5  --  9.4  --  9.0  MG  --   --   --   --   --  1.8  --    GFR: Estimated Creatinine Clearance: 74.8 mL/min (by C-G formula based on SCr of 1.1 mg/dL). Liver Function Tests: Recent  Labs  Lab 07/09/18 0319  AST 17  ALT 23  ALKPHOS 91  BILITOT 0.4  PROT 5.2*  ALBUMIN 2.8*   No results for input(s): LIPASE, AMYLASE in the last 168 hours. No results for input(s): AMMONIA in the last 168 hours. Coagulation Profile: No results for input(s): INR, PROTIME in the last 168 hours. Cardiac Enzymes: No results for input(s): CKTOTAL, CKMB, CKMBINDEX, TROPONINI in the last 168 hours. BNP (last 3 results) No results for input(s): PROBNP in the last 8760 hours. HbA1C: Recent Labs    07/14/18 0308  HGBA1C 5.5   CBG: Recent Labs  Lab 07/09/18 2015 07/14/18 2144 07/15/18 0610 07/15/18 1135  GLUCAP 103* 216* 80 76   Lipid Profile: No results for input(s): CHOL, HDL, LDLCALC, TRIG, CHOLHDL, LDLDIRECT in the last 72 hours. Thyroid Function Tests: No results for input(s): TSH, T4TOTAL, FREET4, T3FREE, THYROIDAB in the last 72 hours. Anemia Panel: No results for input(s): VITAMINB12, FOLATE,  FERRITIN, TIBC, IRON, RETICCTPCT in the last 72 hours. Sepsis Labs: No results for input(s): PROCALCITON, LATICACIDVEN in the last 168 hours.  No results found for this or any previous visit (from the past 240 hour(s)).       Radiology Studies: No results found.      Scheduled Meds: . allopurinol  200 mg Oral q morning - 10a  . amLODipine  10 mg Oral q morning - 10a  . aspirin EC  81 mg Oral Daily  . diclofenac sodium  2 g Topical QID  . docusate sodium  100 mg Oral Daily  . enoxaparin (LOVENOX) injection  40 mg Subcutaneous Q24H  . fenofibrate  160 mg Oral Daily  . insulin aspart  0-9 Units Subcutaneous TID WC  . lisinopril  20 mg Oral Daily  . nicotine  21 mg Transdermal Daily  . rosuvastatin  10 mg Oral q1800  . traZODone  50 mg Oral QHS   Continuous Infusions:   LOS: 11 days    Time spent: 35 mins.More than 50% of that time was spent in counseling and/or coordination of care.      Shelly Coss, MD [facility] Pager [phone]  If 7PM-7AM, please contact night-coverage [email or web address] Password [email or web address] 07/15/2018, 12:10 PM

## 2018-07-15 NOTE — Progress Notes (Signed)
  Speech Language Pathology Treatment: Dysphagia  Patient Details Name: LESEAN WOOLVERTON MRN: [id number] DOB: 02/02/1951 Today's Date: 07/15/2018 Time: 9987-2158 SLP Time Calculation (min) (ACUTE ONLY): 31 min  Assessment / Plan / Recommendation Clinical Impression  Pt consumed regular diet textures and thin liquids without overt concern for aspiration. He and his wife deny any difficulties with meals, he remains afebrile, and lung sounds are clear. No further need for dysphagia intervention identified. SLP to sign off acutely.   Of note, pt and his wife are very concerned about his communication difficulties that continue to fluctuate. Today, his speech was initially fluent, and then after SLP introduced herself, his speech became dysfluent, with a stuttering-type pattern. He said that xanax seems to help him the most - SLP reviewed additional relaxation techniques to attempt, but encouraged him to pursue SLP f/u post-discharge if his symptoms persist.   HPI HPI: Pt is a 67 yo male transferred from [place] ED for higher level mgt of hyponatremia on 12/07 after a week of worsening vomiting and diarrhea. Pt had a seizure-like episode 12/8; MRI negative for acute changes. He had additional acute, transient episodes of difficulty communicating starting 12/10; repeat MRI pending. PMH includes: prostate and kidney CA, peripheral neuropathy of BLE, s/p L trans met amputation, HLD, HTN.        SLP Plan  Continue with current plan of care       Recommendations  Diet recommendations: Regular;Thin liquid Liquids provided via: Cup;Straw Medication Administration: Whole meds with liquid Supervision: Patient able to self feed;Intermittent supervision to cue for compensatory strategies Compensations: Slow rate;Small sips/bites Postural Changes and/or Swallow Maneuvers: Seated upright 90 degrees                Oral Care Recommendations: Oral care BID Follow up Recommendations: Home health SLP;24  hour supervision/assistance SLP Visit Diagnosis: Dysphagia, unspecified (R13.10) Plan: Continue with current plan of care       GO                Germain Osgood 07/15/2018, 5:02 PM  Germain Osgood, M.A. [employer] education officer [phone] Office [phone]

## 2018-07-15 NOTE — Care Management Note (Signed)
Case Management Note  Patient Details  Name: Taylor Elliott MRN: [id number] Date of Birth: 08/01/1950  Subjective/Objective:  67 year old male with history of renal cell carcinoma status post nephrectomy, prostate cancer status post prostatectomy, hypertension, hyperlipidemia, peripheral neuropathy, anxiety disorder presented to [facility] ED with severe symptomatic hyponatremia with sodium of 113. He reported episodes of nausea vomiting and diarrhea a few days prior to presentation.He had a CT head done that showed no acute intracranial abnormality. An LP was performed that showed elevated protein with normal cell count. He was transferred to [facility] and admitted to ICU on 12/7. (From MD Notes)  He did have a witnessed seizure episode during neurology evaluation here                  Action/Plan:  Case manager spoke at length with patient and his wife concerning discharge plan. Patient's wife said that they want to have [facility] provided therapies. CM called referral to Joen Laura, [facility] with referral. Patient will have family support at discharge. Wife expressed her concerns about patient's speech and believes it will get better over time. She is grateful her husband is still alive.     Expected Discharge Date:    07/16/18              Expected Discharge Plan:  [facility](from home)  In-House Referral:  NA  Discharge planning Services  CM Consult  Post Acute Care Choice:  Home Health Choice offered to:  Patient, Spouse  DME Arranged:  N/A(has RW and cane at home) DME Agency:  NA  HH Arranged:  PT, Speech Therapy HH Agency:  [facility] (formerly [employer])  Status of Service:  Completed, signed off  If discussed at [employer], dates discussed:    Additional Comments:  Ninfa Meeker, RN 07/15/2018, 1:15 PM

## 2018-07-16 LAB — BASIC METABOLIC PANEL
Anion gap: 8 (ref 5–15)
BUN: 25 mg/dL — ABNORMAL HIGH (ref 8–23)
CO2: 29 mmol/L (ref 22–32)
Calcium: 8.6 mg/dL — ABNORMAL LOW (ref 8.9–10.3)
Chloride: 98 mmol/L (ref 98–111)
Creatinine, Ser: 1 mg/dL (ref 0.61–1.24)
GFR calc Af Amer: >60 mL/min (ref >60)
GFR calc non Af Amer: >60 mL/min (ref >60)
Glucose, Bld: 93 mg/dL (ref 70–99)
Potassium: 3.6 mmol/L (ref 3.5–5.1)
Sodium: 135 mmol/L (ref 135–145)

## 2018-07-16 LAB — GLUCOSE, CAPILLARY
Glucose-Capillary: 100 mg/dL — ABNORMAL HIGH (ref 70–99)
Glucose-Capillary: 112 mg/dL — ABNORMAL HIGH (ref 70–99)

## 2018-07-16 MED ORDER — ALPRAZOLAM 0.5 MG PO TABS: 0.5000 mg | ORAL_TABLET | Freq: Three times a day (TID) | ORAL | 0 refills | Status: DC | PRN

## 2018-07-16 MED ORDER — TRAZODONE HCL 50 MG PO TABS: 50.0000 mg | ORAL_TABLET | Freq: Every day | ORAL | 0 refills | Status: DC

## 2018-07-16 MED FILL — traZODone HCL 50 MG TABS: 50 | 30 days supply | Qty: 30 | Fill #0

## 2018-07-16 MED FILL — ALPRAZolam 0.5 MG TABS: 0.5 | 6 days supply | Qty: 20 | Fill #0

## 2018-07-16 NOTE — Progress Notes (Signed)
Nsg Discharge Note  Admit Date:  07/04/2018 Discharge date: 07/16/2018   RONNE STEFANSKI to be D/C'd Home per MD order.  AVS completed.  Copy for chart, and copy for patient signed, and dated. Patient/caregiver able to verbalize understanding.  Discharge Medication: Allergies as of 07/16/2018      Reactions   Bee Venom Anaphylaxis   Clindamycin/lincomycin Hives      Medication List    STOP taking these medications   cephALEXin 500 MG capsule Commonly known as:  KEFLEX   PARoxetine 20 MG tablet Commonly known as:  PAXIL     TAKE these medications   acetaminophen 500 MG tablet Commonly known as:  TYLENOL Take 1,000 mg by mouth daily as needed for mild pain or headache.   allopurinol 100 MG tablet Commonly known as:  ZYLOPRIM Take 2 tablets (200 mg total) by mouth every morning.   ALPRAZolam 0.5 MG tablet Commonly known as:  XANAX Take 1 tablet (0.5 mg total) by mouth 3 (three) times daily as needed for anxiety.   amLODipine 10 MG tablet Commonly known as:  NORVASC TAKE 1 TABLET BY MOUTH EVERY MORNING What changed:  when to take this   aspirin EC 81 MG tablet Take 81 mg by mouth daily.   benzonatate 100 MG capsule Commonly known as:  TESSALON Take 1 capsule (100 mg total) by mouth 3 (three) times daily as needed for cough.   calcium carbonate 500 MG chewable tablet Commonly known as:  TUMS - dosed in mg elemental calcium Chew 2 tablets by mouth daily as needed for indigestion or heartburn.   EPINEPHrine 0.3 mg/0.3 mL Soaj injection Commonly known as:  EPI-PEN Inject 0.3 mg into the muscle once.   fenofibrate 160 MG tablet Take 1 tablet (160 mg total) by mouth daily.   fluticasone 50 MCG/ACT nasal spray Commonly known as:  FLONASE Place 2 sprays into both nostrils daily.   lisinopril 20 MG tablet Commonly known as:  PRINIVIL,ZESTRIL TAKE 1 TABLET BY MOUTH ONCE DAILY   loperamide 2 MG capsule Commonly known as:  IMODIUM Take 2 mg by mouth as needed for  diarrhea or loose stools.   rosuvastatin 10 MG tablet Commonly known as:  CRESTOR TAKE 1 TABLET BY MOUTH ONCE DAILY   solifenacin 10 MG tablet Commonly known as:  VESICARE Take 10 mg by mouth daily.   traZODone 50 MG tablet Commonly known as:  DESYREL Take 1 tablet (50 mg total) by mouth at bedtime.       Discharge Assessment: Vitals:   07/15/18 2020 07/16/18 0403  BP: (!) 123/95 133/85  Pulse: 91 82  Resp: 16 15  Temp: 98.6 F (37 C) 98 F (36.7 C)  SpO2: 98% 99%   Skin clean, dry and intact without evidence of skin break down, no evidence of skin tears noted. IV catheter discontinued intact. Site without signs and symptoms of complications - no redness or edema noted at insertion site, patient denies c/o pain - only slight tenderness at site.  Dressing with slight pressure applied.  D/c Instructions-Education: Discharge instructions given to patient/family with verbalized understanding. D/c education completed with patient/family including follow up instructions, medication list, d/c activities limitations if indicated, with other d/c instructions as indicated by MD - patient able to verbalize understanding, all questions fully answered. Patient instructed to return to ED, call 911, or call MD for any changes in condition.  Patient escorted via [place], and D/C home via private auto.  Eda Keys,  RN 07/16/2018 11:53 AM

## 2018-07-16 NOTE — Discharge Summary (Signed)
Physician Discharge Summary  Taylor Elliott GYJ:[id number] DOB: 1951/03/03 DOA: 07/04/2018  PCP: Ann Held, DO  Admit date: 07/04/2018 Discharge date: 07/16/2018  Admitted From: Home Disposition:  Home  Discharge Condition:Stable CODE STATUS:FULL Diet recommendation: Heart Healthy   Brief/Interim Summary:  Patient is a 67 year old male with past medical history of renal cell carcinoma status post nephrectomy,, prostate cancer status post prostatectomy, hypertension, hyperlipidemia, peripheral neuropathy, anxiety disorder presented to [facility] ED with severe symptomatic hyponatremia with sodium of 113. He reported episodes of nausea vomiting and diarrhea a few days prior to presentation.He had a CT head done that showed no acute intracranial abnormality. An LP was performed that showed elevated protein with normal cell count. He was transferred to [facility] and admitted to [facility] 12/7.  He did have a witnessed seizure episode during neurology evaluation here. His sodium normalized with hypertonic saline and he is now transferred out to medical floor. Patient however has had episodes of confusion and agitation especially in the evenings. There has been some concern for episodic word finding difficulties and EEG testing during these episodes did not show any abnormal activity. Psychogenic component suspected per neurology. Patientinitiallyon fluid restriction and psych medications have been held in concern for SIADH induced hyponatremia. Patient received pulse dose steroids. CSF work-up has largely been negative, some autoimmune work-up like NMDA receptor antibody still pending.Neurology and nephrologywere following. Patient now much improved. Off of fluid restriction. On liberalizeddiet. Off of steroids since 12/15. Sodium level improved. Seen by psychiatry who recommends PRN Xanax only for anxiety. Neurology feels initially presented with likely true seizures  secondary to hyponatremia. He subsequent issues with stuttering and word finding which get worse as the day progresses may be more conversion disorder/pseudoseizures.Keppra discontinued.  No witnessed seizures since then. Seen by PT who, and home health PT. Patient is stable for discharge to home today.  He will follow-up with his PMD in a week to check his sodium level.  Following problems were addressed during his hospitalization:  Generalized anxiety disorder/depression: . He has had some odd presentation of word finding which may be more conversion disorder or anxiety because it appears to get worse as the day goes on.  Psychiatry had evaluated him here and recommended as needed Xanax and trazodone.  He will follow-up with psych as an outpatient.  Hyperglycemia: Checked A1c, normal. Likely in the setting of recent pulse dose steroids. Not on medications at home.  Essential hypertension: Blood pressures stable today.  Echocardiogram from several years ago noted no evidence of diastolic or systolic dysfunction.  Continue amlodipine and lisinopril at home.  Hyperlipidemia LDL goal <100: Stable, continue statin  Acute hyponatremia:Unclear etiology. Patient had some component of SIADH, although  suspect initially the triggering point was large amount of water intake on a daily basis and then developed what may have been a gastroenteritis leading to hypovolemic hyponatremia. Last sodium was 135.  He was also on Paxil at home which has been discontinued.  Episodes of seizures: As per neurology, no evidence on EEG during initial episodes, but these were done after seizure-like episodes occurred. Triggering factor may have been severe hyponatremia, which has since resolved and improved. He may now be having occasional pseudoseizures as above. He initially was placed on Keppra. Keppra stopped after morning dose 12/16.No new seizure episodes.  Discharge Diagnoses:  Principal  Problem:   Generalized anxiety disorder Active Problems:   Prostate cancer ([place])   Renal neoplasm   Ulcer of right foot  limited to breakdown of skin ([place])   Essential hypertension   Hyperlipidemia LDL goal <100   Acute hyponatremia   Impingement syndrome of right shoulder   Seizures ([place])    Discharge Instructions  Discharge Instructions    Diet - low sodium heart healthy   Complete by:  As directed    Discharge instructions   Complete by:  As directed    1) Follow up with your PCP in a week.  Do a BMP test during the follow-up to check your sodium level. 2) Follow up with psychiatry as an outpatient in 2 weeks.  Name and number of the partner has been attached. 3)Follow up with home health physical therapy and speech therapy.   Increase activity slowly   Complete by:  As directed      Allergies as of 07/16/2018      Reactions   Bee Venom Anaphylaxis   Clindamycin/lincomycin Hives      Medication List    STOP taking these medications   cephALEXin 500 MG capsule Commonly known as:  KEFLEX   PARoxetine 20 MG tablet Commonly known as:  PAXIL     TAKE these medications   acetaminophen 500 MG tablet Commonly known as:  TYLENOL Take 1,000 mg by mouth daily as needed for mild pain or headache.   allopurinol 100 MG tablet Commonly known as:  ZYLOPRIM Take 2 tablets (200 mg total) by mouth every morning.   ALPRAZolam 0.5 MG tablet Commonly known as:  XANAX Take 1 tablet (0.5 mg total) by mouth 3 (three) times daily as needed for anxiety.   amLODipine 10 MG tablet Commonly known as:  NORVASC TAKE 1 TABLET BY MOUTH EVERY MORNING What changed:  when to take this   aspirin EC 81 MG tablet Take 81 mg by mouth daily.   benzonatate 100 MG capsule Commonly known as:  TESSALON Take 1 capsule (100 mg total) by mouth 3 (three) times daily as needed for cough.   calcium carbonate 500 MG chewable tablet Commonly known as:  TUMS - dosed in mg elemental calcium Chew 2  tablets by mouth daily as needed for indigestion or heartburn.   EPINEPHrine 0.3 mg/0.3 mL Soaj injection Commonly known as:  EPI-PEN Inject 0.3 mg into the muscle once.   fenofibrate 160 MG tablet Take 1 tablet (160 mg total) by mouth daily.   fluticasone 50 MCG/ACT nasal spray Commonly known as:  FLONASE Place 2 sprays into both nostrils daily.   lisinopril 20 MG tablet Commonly known as:  PRINIVIL,ZESTRIL TAKE 1 TABLET BY MOUTH ONCE DAILY   loperamide 2 MG capsule Commonly known as:  IMODIUM Take 2 mg by mouth as needed for diarrhea or loose stools.   rosuvastatin 10 MG tablet Commonly known as:  CRESTOR TAKE 1 TABLET BY MOUTH ONCE DAILY   solifenacin 10 MG tablet Commonly known as:  VESICARE Take 10 mg by mouth daily.   traZODone 50 MG tablet Commonly known as:  DESYREL Take 1 tablet (50 mg total) by mouth at bedtime.      Follow-up Information    [facility].   Specialty:  [place] Why:  A representative from [facility] will contact you to arrange start date and time for your therapy. Contact information: [address] [place] [address] [phone]        Corena Pilgrim, MD. Schedule an appointment as soon as possible for a visit in 2 week(s).  Specialty:  Psychiatry Contact information: [place] [address] [phone]        Ann Held, DO. Schedule an appointment as soon as possible for a visit in 1 week(s).   Specialty:  Family Medicine Contact information: [address] [place] [address] [phone]          Allergies  Allergen Reactions  . Bee Venom Anaphylaxis  . Clindamycin/Lincomycin Hives    Consultations:  Neurology,psychiatry   Procedures/Studies: Dg Chest 2 View  Result Date: 07/04/2018 CLINICAL DATA:  Cough and chest congestion for the past week. Current smoker. History of prostate and renal malignancy as well as  hypertension EXAM: CHEST - 2 VIEW COMPARISON:  Report of a chest x-ray of June 16, 2013 and images from a chest CT scan of January 24, 2018. FINDINGS: The lungs are well-expanded. There is no focal infiltrate. There is no pleural effusion. No suspicious parenchymal nodules are observed in the lungs. The heart is top-normal in size. The pulmonary vascularity is not engorged. There is calcification in the wall of the aortic arch. The bony thorax exhibits no acute abnormality. IMPRESSION: Mild chronic bronchitic changes. No alveolar pneumonia, CHF, nor evidence of metastatic disease. Thoracic aortic atherosclerosis. Electronically Signed   By: David  Martinique M.D.   On: 07/04/2018 12:18   Dg Abd 1 View  Result Date: 07/04/2018 CLINICAL DATA:  One week history of diarrhea. No abdominal pain. History of prostate and renal malignancy. Previous appendectomy. EXAM: ABDOMEN - 1 VIEW COMPARISON:  Abdominal CT scan of January 24, 2018 FINDINGS: There are mildly distended gas-filled loops of small bowel in the mid abdomen and to the right. The stool and gas pattern within the colon and rectum is normal. No free extraluminal gas collections are observed. There are no abnormal soft tissue calcifications. There are surgical clips in the midline and right mid abdomen. IMPRESSION: Moderate colonic stool burden without evidence of small or large bowel obstruction. There is a moderate amount of gas within small bowel loops which could reflect an ileus or a gastroenteritis type process. Electronically Signed   By: David  Martinique M.D.   On: 07/04/2018 12:21   Ct Head Wo Contrast  Result Date: 07/06/2018 CLINICAL DATA:  Initial evaluation for acute altered mental status. EXAM: CT HEAD WITHOUT CONTRAST TECHNIQUE: Contiguous axial images were obtained from the base of the skull through the vertex without intravenous contrast. COMPARISON:  Prior CT from 07/04/2018. FINDINGS: Brain: Stable age-related cerebral atrophy. No acute  intracranial hemorrhage. No acute large vessel territory infarct. No mass lesion, midline shift or mass effect. No hydrocephalus. No extra-axial fluid collection. Vascular: No hyperdense vessel. Scattered vascular calcifications noted within the carotid siphons. Skull: Scalp soft tissues and calvarium within normal limits. Sinuses/Orbits: Globes and orbital soft tissues normal. Scattered chronic mucosal thickening within the ethmoidal air cells. Paranasal sinuses are otherwise clear. Trace bilateral mastoid effusions, of doubtful significance. Other: None. IMPRESSION: Stable appearance of the head. No acute intracranial abnormality identified. Electronically Signed   By: Jeannine Boga M.D.   On: 07/06/2018 03:22   Ct Head Wo Contrast  Result Date: 07/04/2018 CLINICAL DATA:  [place]alaise EXAM: CT HEAD WITHOUT CONTRAST TECHNIQUE: Contiguous axial images were obtained from the base of the skull through the vertex without intravenous contrast. COMPARISON:  09/26/2015 FINDINGS: Brain: Mild age related involutional changes of the brain. Scattered mild small vessel ischemic disease of periventricular white matter. No evidence of acute large vascular territory  infarction, hemorrhage, hydrocephalus, extra-axial collection or mass lesion/mass effect. Vascular: No hyperdense vessel or unexpected calcification. Skull: Normal. Negative for fracture or focal lesion. Sinuses/Orbits: No acute finding. Status post functional endoscopic surgery with interval clearing of left maxillary sinus mucosal opacification since prior. Other: None. IMPRESSION: Atrophy without acute intracranial abnormality. Electronically Signed   By: Ashley Royalty M.D.   On: 07/04/2018 20:46   Mr Jeri Cos JJ Contrast  Result Date: 07/10/2018 CLINICAL DATA:  67 year old male with altered mental status. Multiple episodes of expressive aphasia and seizure-like activity. EXAM: MRI HEAD WITHOUT AND WITH CONTRAST TECHNIQUE: Multiplanar, multiecho pulse  sequences of the brain and surrounding structures were obtained without and with intravenous contrast. CONTRAST:  10 milliliters Gadavist COMPARISON:  Brain MRI 07/06/2018 and earlier. FINDINGS: Brain: No restricted diffusion to suggest acute infarction. No midline shift, mass effect, evidence of mass lesion, ventriculomegaly, extra-axial collection or acute intracranial hemorrhage. Cervicomedullary junction and pituitary are within normal limits. Pearline Cables and white matter signal remains within normal limits for age. Sulcal variation or disproportionate cerebral volume loss in the left parietal lobe redemonstrated with no cortical encephalomalacia. No heterotopia or migrational disorder. No chronic cerebral blood products. No abnormal enhancement identified. No dural thickening. Vascular: Major intracranial vascular flow voids are preserved and stable. The major dural venous sinuses are enhancing and appear patent. Skull and upper cervical spine: Negative visible cervical spine. Normal bone marrow signal. Sinuses/Orbits: Stable and negative. Other: Mild right mastoid effusion is stable. Negative visible nasopharynx. Visible internal auditory structures appear normal. Scalp and face soft tissues appear negative. IMPRESSION: Continued stable MRI appearance of the brain, largely negative for age. No abnormal enhancement or No acute intracranial abnormality. Electronically Signed   By: Genevie Ann M.D.   On: 07/10/2018 17:12   Mr Jeri Cos KK Contrast  Result Date: 07/06/2018 CLINICAL DATA:  Altered mental status.  Seizure like activity. EXAM: MRI HEAD WITHOUT AND WITH CONTRAST TECHNIQUE: Multiplanar, multiecho pulse sequences of the brain and surrounding structures were obtained without and with intravenous contrast. CONTRAST:  9 cc Gadavist COMPARISON:  CT study earlier same day.  MRI 02/07/2015. FINDINGS: Brain: Diffusion imaging does not show any acute or subacute infarction. There chronic small-vessel ischemic changes of  the pons. No focal cerebellar insult. Cerebral hemispheres show atrophy, focally prominent affecting left parietal gyri. Some potential this could be developmental or related to an arachnoid cyst. Brain anomaly in this region could possibly be associated with seizures. There are mild chronic small-vessel ischemic changes of the hemispheric white matter, similar to the study of 2016. No mass lesion, hemorrhage, hydrocephalus or subdural collection. Mesial temporal lobes appear symmetric and within normal limits. No abnormal contrast enhancement occurs. Vascular: Major vessels at the base of the brain show flow. Skull and upper cervical spine: Negative Sinuses/Orbits: Clear/normal Other: None IMPRESSION: No change since 2016. Mild generalized atrophy. Mild chronic small-vessel change of the hemispheric white matter. Abnormal gyral pattern in the left parietal region which could be due to focal atrophy, congenital variation or possibly an arachnoid cyst. Brain anomaly in this region could possibly be associated with seizure history. Electronically Signed   By: Nelson Chimes M.D.   On: 07/06/2018 08:51   Dg Chest Port 1 View  Result Date: 07/06/2018 CLINICAL DATA:  Central line placement EXAM: PORTABLE CHEST 1 VIEW COMPARISON:  07/04/2018 FINDINGS: Lungs are essentially clear.  No pleural effusion or pneumothorax. The heart is normal in size. Right IJ venous catheter terminates in the lower SVC.  Thoracic aortic atherosclerosis. IMPRESSION: Right IJ venous catheter terminates in the lower SVC. Electronically Signed   By: Julian Hy M.D.   On: 07/06/2018 21:34      Subjective: Patient seen and examined the bedside this morning.  Stable for discharge to home today.  Discussed extensively about discharge planning with wife.  Discharge Exam: Vitals:   07/15/18 2020 07/16/18 0403  BP: (!) 123/95 133/85  Pulse: 91 82  Resp: 16 15  Temp: 98.6 F (37 C) 98 F (36.7 C)  SpO2: 98% 99%   Vitals:    07/15/18 1130 07/15/18 2020 07/16/18 0403 07/16/18 0500  BP: 127/81 (!) 123/95 133/85   Pulse: 93 91 82   Resp: 17 16 15    Temp: 98.7 F (37.1 C) 98.6 F (37 C) 98 F (36.7 C)   TempSrc: Oral Oral Oral   SpO2: 99% 98% 99%   Weight:    97.1 kg  Height:        General: Pt is alert, awake, not in acute distress Cardiovascular: RRR, S1/S2 +, no rubs, no gallops Respiratory: CTA bilaterally, no wheezing, no rhonchi Abdominal: Soft, NT, ND, bowel sounds + Extremities: no edema, no cyanosis    The results of significant diagnostics from this hospitalization (including imaging, microbiology, ancillary and laboratory) are listed below for reference.     Microbiology: No results found for this or any previous visit (from the past 240 hour(s)).   Labs: BNP (last 3 results) Recent Labs    07/14/18 1850  BNP 678.9*   Basic Metabolic Panel: Recent Labs  Lab 07/11/18 0339 07/11/18 1051 07/12/18 0248 07/12/18 0816 07/14/18 0316 07/15/18 1227 07/16/18 0210  NA 136 136 136  --  140 138 135  K 3.4*  --  3.9  --  3.9 3.7 3.6  CL 100  --  102  --  99 98 98  CO2 26  --  22  --  29 29 29   GLUCOSE 172*  --  139*  --  228* 74 93  BUN 13  --  17  --  28* 25* 25*  CREATININE 0.85  --  1.01  --  1.10 1.08 1.00  CALCIUM 9.5  --  9.4  --  9.0 9.2 8.6*  MG  --   --   --  1.8  --   --   --    Liver Function Tests: No results for input(s): AST, ALT, ALKPHOS, BILITOT, PROT, ALBUMIN in the last 168 hours. No results for input(s): LIPASE, AMYLASE in the last 168 hours. No results for input(s): AMMONIA in the last 168 hours. CBC: Recent Labs  Lab 07/11/18 0339 07/14/18 0316  WBC 7.3 6.3  HGB 10.5* 10.7*  HCT 32.4* 32.8*  MCV 89.3 90.1  PLT 201 229   Cardiac Enzymes: No results for input(s): CKTOTAL, CKMB, CKMBINDEX, TROPONINI in the last 168 hours. BNP: Invalid input(s): POCBNP CBG: Recent Labs  Lab 07/15/18 1135 07/15/18 1617 07/15/18 2105 07/16/18 0616 07/16/18 1055   GLUCAP 76 119* 188* 100* 112*   D-Dimer No results for input(s): DDIMER in the last 72 hours. Hgb A1c Recent Labs    07/14/18 0308  HGBA1C 5.5   Lipid Profile No results for input(s): CHOL, HDL, LDLCALC, TRIG, CHOLHDL, LDLDIRECT in the last 72 hours. Thyroid function studies No results for input(s): TSH, T4TOTAL, T3FREE, THYROIDAB in the last 72 hours.  Invalid input(s): FREET3 Anemia work up No results for input(s): VITAMINB12, FOLATE, FERRITIN, TIBC, IRON, RETICCTPCT  in the last 72 hours. Urinalysis    Component Value Date/Time   COLORURINE YELLOW 07/10/2018 2312   APPEARANCEUR CLEAR 07/10/2018 2312   LABSPEC 1.017 07/10/2018 2312   PHURINE 6.0 07/10/2018 2312   GLUCOSEU 50 (A) 07/10/2018 2312   HGBUR NEGATIVE 07/10/2018 2312   BILIRUBINUR NEGATIVE 07/10/2018 2312   KETONESUR NEGATIVE 07/10/2018 2312   PROTEINUR NEGATIVE 07/10/2018 2312   NITRITE NEGATIVE 07/10/2018 2312   LEUKOCYTESUR NEGATIVE 07/10/2018 2312   Sepsis Labs Invalid input(s): PROCALCITONIN,  WBC,  LACTICIDVEN Microbiology No results found for this or any previous visit (from the past 240 hour(s)).  Please note: You were cared for by a hospitalist during your hospital stay. Once you are discharged, your primary care physician will handle any further medical issues. Please note that NO REFILLS for any discharge medications will be authorized once you are discharged, as it is imperative that you return to your primary care physician (or establish a relationship with a primary care physician if you do not have one) for your post hospital discharge needs so that they can reassess your need for medications and monitor your lab values.    Time coordinating discharge: 40 minutes  SIGNED:   Shelly Coss, MD  [facility] 07/16/2018, 11:13 AM Pager [phone]  If 7PM-7AM, please contact night-coverage [email or web address] Password [email or web address]

## 2018-07-16 NOTE — Discharge Instructions (Signed)
Hyponatremia Hyponatremia is when the amount of salt (sodium) in your blood is too low. When sodium levels are low, your cells absorb extra water and they swell. The swelling happens throughout the body, but it mostly affects the brain. What are the causes? This condition may be caused by:  Heart, kidney, or liver problems.  Thyroid problems.  Adrenal gland problems.  Metabolic conditions, such as syndrome of inappropriate antidiuretic hormone (SIADH).  Severe vomiting and diarrhea.  Certain medicines or illegal drugs.  Dehydration.  Drinking too much water.  Eating a diet that is low in sodium.  Large burns on your body.  Sweating. What increases the risk? This condition is more likely to develop in people who:  Have long-term (chronic) kidney disease.  Have heart failure.  Have a medical condition that causes frequent or excessive diarrhea.  Have metabolic conditions, such as Addison disease or SIADH.  Take certain medicines that affect the sodium and fluid balance in the blood. Some of these medicine types include: ? Diuretics. ? NSAIDs. ? Some opioid pain medicines. ? Some antidepressants. ? Some seizure prevention medicines. What are the signs or symptoms? Symptoms of this condition include:  Nausea and vomiting.  Confusion.  Lethargy.  Agitation.  Headache.  Seizures.  Unconsciousness.  Appetite loss.  Muscle weakness and cramping.  Feeling weak or light-headed.  Having a rapid heart rate.  Fainting, in severe cases. How is this diagnosed? This condition is diagnosed with a medical history and physical exam. You will also have other tests, including:  Blood tests.  Urine tests. How is this treated? Treatment for this condition depends on the cause. Treatment may include:  Fluids given through an IV tube that is inserted into one of your veins.  Medicines to correct the sodium imbalance. If medicines are causing the condition, the  medicines will need to be adjusted.  Limiting water or fluid intake to get the correct sodium balance. Follow these instructions at home:  Take medicines only as directed by your health care provider. Many medicines can make this condition worse. Talk with your health care provider about any medicines that you are currently taking.  Carefully follow a recommended diet as directed by your health care provider.  Carefully follow instructions from your health care provider about fluid restrictions.  Keep all follow-up visits as directed by your health care provider. This is important.  Do not drink alcohol. Contact a health care provider if:  You develop worsening nausea, fatigue, headache, confusion, or weakness.  Your symptoms go away and then return.  You have problems following the recommended diet. Get help right away if:  You have a seizure.  You faint.  You have ongoing diarrhea or vomiting. This information is not intended to replace advice given to you by your health care provider. Make sure you discuss any questions you have with your health care provider. Document Released: 07/06/2002 Document Revised: 12/22/2015 Document Reviewed: 08/05/2014 Elsevier Interactive Patient Education  2019 [employer].

## 2018-07-17 ENCOUNTER — Other Ambulatory Visit: Payer: Self-pay | Admitting: *Deleted

## 2018-07-17 DIAGNOSIS — M7541 Impingement syndrome of right shoulder: Secondary | ICD-10-CM | POA: Diagnosis not present

## 2018-07-17 DIAGNOSIS — M48062 Spinal stenosis, lumbar region with neurogenic claudication: Secondary | ICD-10-CM | POA: Diagnosis not present

## 2018-07-17 DIAGNOSIS — R1310 Dysphagia, unspecified: Secondary | ICD-10-CM | POA: Diagnosis not present

## 2018-07-17 DIAGNOSIS — M7042 Prepatellar bursitis, left knee: Secondary | ICD-10-CM | POA: Diagnosis not present

## 2018-07-17 DIAGNOSIS — N189 Chronic kidney disease, unspecified: Secondary | ICD-10-CM | POA: Diagnosis not present

## 2018-07-17 DIAGNOSIS — F985 Adult onset fluency disorder: Secondary | ICD-10-CM | POA: Diagnosis not present

## 2018-07-17 DIAGNOSIS — I129 Hypertensive chronic kidney disease with stage 1 through stage 4 chronic kidney disease, or unspecified chronic kidney disease: Secondary | ICD-10-CM | POA: Diagnosis not present

## 2018-07-17 DIAGNOSIS — M109 Gout, unspecified: Secondary | ICD-10-CM | POA: Diagnosis not present

## 2018-07-17 DIAGNOSIS — M21372 Foot drop, left foot: Secondary | ICD-10-CM | POA: Diagnosis not present

## 2018-07-17 NOTE — Patient Outreach (Addendum)
[place] [facility]) Care Management  07/17/2018  BENTLEY FISSEL 10-11-1950 [id number]   Transition of care call  Objective:  Mr. Russey was hospitalized at [facility] from 12/6-12/18/19 after transfer from [facility] for severe hyponatremia with seizure activity and subsequent issues with word finding difficulty and stuttering speech. Significant comorbidities include: renal cell carcinoma s/p nephrectomy, prostate cancer s/p prostatectomy, HTN, hyperlipidemia, peripheral neuropathy, gout and anxiety disorder.  He was discharged to home on 12/18 with home health services of physical therapy and speech therapy provided by [facility].   Subjective: Initial successful telephone call to patient's mobile number, after verifying 2 HIPAA identifiers and explaining purpose of call, Mr.Vaile stated he was unable to talk because the speech therapist from [facility] was there to provide speech therapy. He requested a return call on 12/20 at 10:00 a.m.   Plan:  Telephone call to patient tomorrow at 10:00 a.m. to complete transition of care assessment.     Barrington Ellison RN,CCM,CDE [place] Management Coordinator Office Phone [phone] Office Fax [phone]

## 2018-07-18 ENCOUNTER — Encounter: Payer: Self-pay | Admitting: *Deleted

## 2018-07-18 ENCOUNTER — Ambulatory Visit: Payer: Self-pay | Admitting: *Deleted

## 2018-07-18 ENCOUNTER — Other Ambulatory Visit: Payer: Self-pay | Admitting: *Deleted

## 2018-07-18 ENCOUNTER — Telehealth: Payer: Self-pay | Admitting: Family Medicine

## 2018-07-18 NOTE — Telephone Encounter (Signed)
Copied from [place] [phone]. Topic: Quick Communication - Home Health Verbal Orders >> Jul 18, 2018  8:13 AM Margot Ables wrote: Caller/Agency: Clarene Essex with [place] Number: [phone], secure VM if no able to answer Requesting OT/PT/Skilled Nursing/Social Work: ST Frequency: requesting VO for ST 1x week for 3 weeks, pt was hospitalized for low sodium and had a seizure, pt has developed a bad stutter since that time

## 2018-07-18 NOTE — Patient Outreach (Addendum)
[place] [facility]) Care Management  07/18/2018  JIHAAD BRUSCHI [birth year] [id number]  Transition of care call  Objective:  Mr. Berg was hospitalized at [facility] from 12/6-12/18/19 after transfer from [facility] for severe hyponatremia with seizure activity and subsequent issues with word finding difficulty and stuttering speech . Significant comorbidities include: renal cell carcinoma s/p nephrectomy, prostate cancer s/p prostatectomy, HTN, hyperlipidemia, peripheral neuropathy, gout and anxiety disorder.  He was discharged to home on 12/18 with home health services of physical therapy and speech therapy provided by [facility].   Subjective: Per patient's request of yesterday, successful telephone call to patient's mobile number at 10:00 am today, 2 HIPAA identifiers obtained and transition of care assessment completed.See transition of care template for details. Mr. Gulino says he received his first home health speech therapy session yesterday but has not been contacted about his home physical therapy to improve his balance. He says he uses a brace on his left leg and is using a rolling walker to ambulate. Encouraged him to call [facility] and ensured he has the contact number, to determine the timing of his first physical therapy session.  Discussed psychiatric follow up as per discharge instructions and Mr. Cowger says he has never required a psychiatrist for his depression and he will not be making an appointment. Encouraged him to discuss this with Dr. Etter Sjogren, his primary care provider,  at his appointment on 07/21/18.    Plan:  No care management needs identified so will close case to [place] Management care management services and route successful outreach letter to [place] Management clinical pool to be mailed to patient's home address.      Barrington Ellison RN,CCM,CDE [place] Management Coordinator Office Phone [phone] Office Fax [phone]

## 2018-07-19 LAB — MISC LABCORP TEST (SEND OUT): Labcorp test code: 9985

## 2018-07-21 ENCOUNTER — Encounter: Payer: Self-pay | Admitting: Family Medicine

## 2018-07-21 ENCOUNTER — Ambulatory Visit: Payer: 59 | Admitting: Family Medicine

## 2018-07-21 VITALS — BP 130/78 | HR 78 | Temp 97.9°F | Resp 16 | Ht 68.0 in | Wt 201.8 lb

## 2018-07-21 DIAGNOSIS — F419 Anxiety disorder, unspecified: Secondary | ICD-10-CM | POA: Diagnosis not present

## 2018-07-21 DIAGNOSIS — E871 Hypo-osmolality and hyponatremia: Secondary | ICD-10-CM | POA: Insufficient documentation

## 2018-07-21 LAB — COMPREHENSIVE METABOLIC PANEL
ALT: 23 U/L (ref 0–53)
AST: 12 U/L (ref 0–37)
Albumin: 4 g/dL (ref 3.5–5.2)
Alkaline Phosphatase: 96 U/L (ref 39–117)
BUN: 17 mg/dL (ref 6–23)
CO2: 27 mEq/L (ref 19–32)
Calcium: 9.5 mg/dL (ref 8.4–10.5)
Chloride: 101 mEq/L (ref 96–112)
Creatinine, Ser: 1 mg/dL (ref 0.40–1.50)
GFR: 79.4 mL/min (ref >60.00)
Glucose, Bld: 111 mg/dL — ABNORMAL HIGH (ref 70–99)
Potassium: 5 mEq/L (ref 3.5–5.1)
Sodium: 136 mEq/L (ref 135–145)
Total Bilirubin: 0.4 mg/dL (ref 0.2–1.2)
Total Protein: 6.3 g/dL (ref 6.0–8.3)

## 2018-07-21 MED ORDER — ALPRAZOLAM 0.25 MG PO TABS: 0.2500 mg | ORAL_TABLET | Freq: Three times a day (TID) | ORAL | 0 refills | Status: DC | PRN

## 2018-07-21 MED FILL — ALPRAZolam 0.25 MG TABS: 0.25 | 15 days supply | Qty: 45 | Fill #0

## 2018-07-21 NOTE — Assessment & Plan Note (Signed)
Check cmp today ?etiology---
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
## 2018-07-21 NOTE — Patient Instructions (Signed)
Hyponatremia Hyponatremia is when the amount of salt (sodium) in your blood is too low. When sodium levels are low, your cells absorb extra water and they swell. The swelling happens throughout the body, but it mostly affects the brain. What are the causes? This condition may be caused by:  Heart, kidney, or liver problems.  Thyroid problems.  Adrenal gland problems.  Metabolic conditions, such as syndrome of inappropriate antidiuretic hormone (SIADH).  Severe vomiting and diarrhea.  Certain medicines or illegal drugs.  Dehydration.  Drinking too much water.  Eating a diet that is low in sodium.  Large burns on your body.  Sweating. What increases the risk? This condition is more likely to develop in people who:  Have long-term (chronic) kidney disease.  Have heart failure.  Have a medical condition that causes frequent or excessive diarrhea.  Have metabolic conditions, such as Addison disease or SIADH.  Take certain medicines that affect the sodium and fluid balance in the blood. Some of these medicine types include: ? Diuretics. ? NSAIDs. ? Some opioid pain medicines. ? Some antidepressants. ? Some seizure prevention medicines. What are the signs or symptoms? Symptoms of this condition include:  Nausea and vomiting.  Confusion.  Lethargy.  Agitation.  Headache.  Seizures.  Unconsciousness.  Appetite loss.  Muscle weakness and cramping.  Feeling weak or light-headed.  Having a rapid heart rate.  Fainting, in severe cases. How is this diagnosed? This condition is diagnosed with a medical history and physical exam. You will also have other tests, including:  Blood tests.  Urine tests. How is this treated? Treatment for this condition depends on the cause. Treatment may include:  Fluids given through an IV tube that is inserted into one of your veins.  Medicines to correct the sodium imbalance. If medicines are causing the condition, the  medicines will need to be adjusted.  Limiting water or fluid intake to get the correct sodium balance. Follow these instructions at home:  Take medicines only as directed by your health care provider. Many medicines can make this condition worse. Talk with your health care provider about any medicines that you are currently taking.  Carefully follow a recommended diet as directed by your health care provider.  Carefully follow instructions from your health care provider about fluid restrictions.  Keep all follow-up visits as directed by your health care provider. This is important.  Do not drink alcohol. Contact a health care provider if:  You develop worsening nausea, fatigue, headache, confusion, or weakness.  Your symptoms go away and then return.  You have problems following the recommended diet. Get help right away if:  You have a seizure.  You faint.  You have ongoing diarrhea or vomiting. This information is not intended to replace advice given to you by your health care provider. Make sure you discuss any questions you have with your health care provider. Document Released: 07/06/2002 Document Revised: 12/22/2015 Document Reviewed: 08/05/2014 Elsevier Interactive Patient Education  2019 [employer].

## 2018-07-22 DIAGNOSIS — F985 Adult onset fluency disorder: Secondary | ICD-10-CM | POA: Diagnosis not present

## 2018-07-22 DIAGNOSIS — M48062 Spinal stenosis, lumbar region with neurogenic claudication: Secondary | ICD-10-CM | POA: Diagnosis not present

## 2018-07-22 DIAGNOSIS — M7042 Prepatellar bursitis, left knee: Secondary | ICD-10-CM | POA: Diagnosis not present

## 2018-07-22 DIAGNOSIS — R1310 Dysphagia, unspecified: Secondary | ICD-10-CM | POA: Diagnosis not present

## 2018-07-22 DIAGNOSIS — N189 Chronic kidney disease, unspecified: Secondary | ICD-10-CM | POA: Diagnosis not present

## 2018-07-22 DIAGNOSIS — M109 Gout, unspecified: Secondary | ICD-10-CM | POA: Diagnosis not present

## 2018-07-22 DIAGNOSIS — M7541 Impingement syndrome of right shoulder: Secondary | ICD-10-CM | POA: Diagnosis not present

## 2018-07-22 DIAGNOSIS — M21372 Foot drop, left foot: Secondary | ICD-10-CM | POA: Diagnosis not present

## 2018-07-22 DIAGNOSIS — I129 Hypertensive chronic kidney disease with stage 1 through stage 4 chronic kidney disease, or unspecified chronic kidney disease: Secondary | ICD-10-CM | POA: Diagnosis not present

## 2018-07-22 NOTE — Telephone Encounter (Signed)
New order.  Since we saw him yesterday, ok to do?

## 2018-07-24 ENCOUNTER — Telehealth: Payer: Self-pay | Admitting: Family Medicine

## 2018-07-24 NOTE — Telephone Encounter (Signed)
yes

## 2018-07-24 NOTE — Telephone Encounter (Signed)
Verbal order given to Juliann Pulse, [place]

## 2018-07-24 NOTE — Telephone Encounter (Signed)
OK 

## 2018-07-24 NOTE — Telephone Encounter (Signed)
Copied from [place] [phone]. Topic: Quick Communication - Home Health Verbal Orders >> Jul 24, 2018  4:52 PM Alanda Slim E wrote: Caller/Agency: Lajoyce Lauber Number: [phone] ( secure VM) Requesting PT Frequency: 1x a week for 2 weeks

## 2018-07-24 NOTE — Telephone Encounter (Signed)
Copied from [place] [phone]. Topic: Quick Communication - Home Health Verbal Orders >> Jul 18, 2018  8:13 AM Margot Ables wrote: Caller/Agency: Clarene Essex with [place] Number: [phone], secure VM if no able to answer Requesting OT/PT/Skilled Nursing/Social Work: ST Frequency: requesting VO for ST 1x week for 3 weeks, pt was hospitalized for low sodium and had a seizure, pt has developed a bad stutter since that time >> Jul 24, 2018 11:36 AM Windy Kalata wrote: Juliann Pulse from [facility] called again in regards to orders for patient  Call back is [phone]

## 2018-07-25 ENCOUNTER — Telehealth: Payer: Self-pay

## 2018-07-25 DIAGNOSIS — M21372 Foot drop, left foot: Secondary | ICD-10-CM | POA: Diagnosis not present

## 2018-07-25 DIAGNOSIS — M7042 Prepatellar bursitis, left knee: Secondary | ICD-10-CM | POA: Diagnosis not present

## 2018-07-25 DIAGNOSIS — N189 Chronic kidney disease, unspecified: Secondary | ICD-10-CM | POA: Diagnosis not present

## 2018-07-25 DIAGNOSIS — M7541 Impingement syndrome of right shoulder: Secondary | ICD-10-CM | POA: Diagnosis not present

## 2018-07-25 DIAGNOSIS — F985 Adult onset fluency disorder: Secondary | ICD-10-CM | POA: Diagnosis not present

## 2018-07-25 DIAGNOSIS — M109 Gout, unspecified: Secondary | ICD-10-CM | POA: Diagnosis not present

## 2018-07-25 DIAGNOSIS — I129 Hypertensive chronic kidney disease with stage 1 through stage 4 chronic kidney disease, or unspecified chronic kidney disease: Secondary | ICD-10-CM | POA: Diagnosis not present

## 2018-07-25 DIAGNOSIS — R1310 Dysphagia, unspecified: Secondary | ICD-10-CM | POA: Diagnosis not present

## 2018-07-25 DIAGNOSIS — M48062 Spinal stenosis, lumbar region with neurogenic claudication: Secondary | ICD-10-CM | POA: Diagnosis not present

## 2018-07-25 NOTE — Telephone Encounter (Signed)
Call sent to me.  I went ahead and called Taylor Elliott and left order for requested ST on her VM Route back to PCP as an [place]

## 2018-07-25 NOTE — Telephone Encounter (Signed)
Verbal orders given for PT and speech therapy

## 2018-07-25 NOTE — Telephone Encounter (Signed)
Copied from [place] [phone]. Topic: Quick Communication - Home Health Verbal Orders >> Jul 18, 2018  8:13 AM Margot Ables wrote: Caller/Agency: Clarene Essex with [place] Number: [phone], secure VM if no able to answer Requesting OT/PT/Skilled Nursing/Social Work: ST Frequency: requesting VO for ST 1x week for 3 weeks, pt was hospitalized for low sodium and had a seizure, pt has developed a bad stutter since that time >> Jul 24, 2018 11:36 AM Windy Kalata wrote: Juliann Pulse from [facility] called again in regards to orders for patient  Call back is [phone] >> Jul 24, 2018  5:04 PM Sharene Skeans wrote: Cind[facility] Salt [facility] called and was asked to follow up on Pt ST orders/ please advise ST of orders

## 2018-07-28 DIAGNOSIS — T63441D Toxic effect of venom of bees, accidental (unintentional), subsequent encounter: Secondary | ICD-10-CM | POA: Diagnosis not present

## 2018-07-28 DIAGNOSIS — T63451D Toxic effect of venom of hornets, accidental (unintentional), subsequent encounter: Secondary | ICD-10-CM | POA: Diagnosis not present

## 2018-07-28 DIAGNOSIS — T63461D Toxic effect of venom of wasps, accidental (unintentional), subsequent encounter: Secondary | ICD-10-CM | POA: Diagnosis not present

## 2018-07-31 DIAGNOSIS — M7541 Impingement syndrome of right shoulder: Secondary | ICD-10-CM | POA: Diagnosis not present

## 2018-07-31 DIAGNOSIS — F985 Adult onset fluency disorder: Secondary | ICD-10-CM | POA: Diagnosis not present

## 2018-07-31 DIAGNOSIS — M48062 Spinal stenosis, lumbar region with neurogenic claudication: Secondary | ICD-10-CM | POA: Diagnosis not present

## 2018-07-31 DIAGNOSIS — I129 Hypertensive chronic kidney disease with stage 1 through stage 4 chronic kidney disease, or unspecified chronic kidney disease: Secondary | ICD-10-CM | POA: Diagnosis not present

## 2018-07-31 DIAGNOSIS — N189 Chronic kidney disease, unspecified: Secondary | ICD-10-CM | POA: Diagnosis not present

## 2018-07-31 DIAGNOSIS — R1310 Dysphagia, unspecified: Secondary | ICD-10-CM | POA: Diagnosis not present

## 2018-07-31 DIAGNOSIS — M21372 Foot drop, left foot: Secondary | ICD-10-CM | POA: Diagnosis not present

## 2018-07-31 DIAGNOSIS — M7042 Prepatellar bursitis, left knee: Secondary | ICD-10-CM | POA: Diagnosis not present

## 2018-07-31 DIAGNOSIS — M109 Gout, unspecified: Secondary | ICD-10-CM | POA: Diagnosis not present

## 2018-08-01 DIAGNOSIS — Z85528 Personal history of other malignant neoplasm of kidney: Secondary | ICD-10-CM | POA: Diagnosis not present

## 2018-08-01 DIAGNOSIS — Z8546 Personal history of malignant neoplasm of prostate: Secondary | ICD-10-CM | POA: Diagnosis not present

## 2018-08-06 DIAGNOSIS — C787 Secondary malignant neoplasm of liver and intrahepatic bile duct: Secondary | ICD-10-CM | POA: Diagnosis not present

## 2018-08-06 DIAGNOSIS — R911 Solitary pulmonary nodule: Secondary | ICD-10-CM | POA: Diagnosis not present

## 2018-08-06 DIAGNOSIS — C649 Malignant neoplasm of unspecified kidney, except renal pelvis: Secondary | ICD-10-CM | POA: Diagnosis not present

## 2018-08-06 DIAGNOSIS — C7951 Secondary malignant neoplasm of bone: Secondary | ICD-10-CM | POA: Diagnosis not present

## 2018-08-07 ENCOUNTER — Other Ambulatory Visit: Payer: Self-pay | Admitting: *Deleted

## 2018-08-07 NOTE — Patient Outreach (Signed)
[place] [facility]) Care Management  08/07/2018  Taylor Elliott 05/15/1951 [id number]   Monthly UMR Case Management Review Monthly telephonic UMR case management review. Update provided per details in transition of care call note on 07/18/18.  Barrington Ellison RN,CCM,CDE [place] Management Coordinator Office Phone [phone] Office Fax [phone]

## 2018-08-11 ENCOUNTER — Encounter (INDEPENDENT_AMBULATORY_CARE_PROVIDER_SITE_OTHER): Payer: Self-pay | Admitting: Orthopedic Surgery

## 2018-08-11 ENCOUNTER — Ambulatory Visit (INDEPENDENT_AMBULATORY_CARE_PROVIDER_SITE_OTHER): Payer: 59 | Admitting: Orthopedic Surgery

## 2018-08-11 DIAGNOSIS — M7061 Trochanteric bursitis, right hip: Secondary | ICD-10-CM

## 2018-08-11 DIAGNOSIS — L97511 Non-pressure chronic ulcer of other part of right foot limited to breakdown of skin: Secondary | ICD-10-CM

## 2018-08-11 DIAGNOSIS — B351 Tinea unguium: Secondary | ICD-10-CM

## 2018-08-11 DIAGNOSIS — Z89432 Acquired absence of left foot: Secondary | ICD-10-CM | POA: Diagnosis not present

## 2018-08-11 MED ORDER — LIDOCAINE HCL 1 % IJ SOLN
5.0000 mL | INTRAMUSCULAR | Status: AC | PRN
  Administered 2018-08-11: 5 mL

## 2018-08-11 MED ORDER — METHYLPREDNISOLONE ACETATE 40 MG/ML IJ SUSP
40.0000 mg | INTRAMUSCULAR | Status: AC | PRN
  Administered 2018-08-11: 40 mg via INTRA_ARTICULAR
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
## 2018-08-12 ENCOUNTER — Telehealth: Payer: Self-pay | Admitting: *Deleted

## 2018-08-12 MED FILL — FENOFIBRATE 160 MG TABLET: 160 | 30 days supply | Qty: 30 | Fill #2

## 2018-08-12 NOTE — Telephone Encounter (Signed)
Received Home Health Certification and Plan of Care; forwarded to provider/SLS 01/14

## 2018-08-13 DIAGNOSIS — C641 Malignant neoplasm of right kidney, except renal pelvis: Secondary | ICD-10-CM | POA: Diagnosis not present

## 2018-08-13 DIAGNOSIS — Z8546 Personal history of malignant neoplasm of prostate: Secondary | ICD-10-CM | POA: Diagnosis not present

## 2018-08-14 ENCOUNTER — Telehealth: Payer: Self-pay | Admitting: Hematology & Oncology

## 2018-08-14 NOTE — Telephone Encounter (Signed)
Spoke with patient regarding appointment date/time/location per 1/16 sch msg

## 2018-08-19 ENCOUNTER — Telehealth: Payer: Self-pay | Admitting: Emergency Medicine

## 2018-08-19 ENCOUNTER — Ambulatory Visit (INDEPENDENT_AMBULATORY_CARE_PROVIDER_SITE_OTHER): Payer: Self-pay | Admitting: Physician Assistant

## 2018-08-19 ENCOUNTER — Other Ambulatory Visit (INDEPENDENT_AMBULATORY_CARE_PROVIDER_SITE_OTHER): Payer: 59

## 2018-08-19 DIAGNOSIS — E785 Hyperlipidemia, unspecified: Secondary | ICD-10-CM | POA: Diagnosis not present

## 2018-08-19 LAB — COMPREHENSIVE METABOLIC PANEL
ALT: 87 U/L — ABNORMAL HIGH (ref 0–53)
AST: 65 U/L — ABNORMAL HIGH (ref 0–37)
Albumin: 4 g/dL (ref 3.5–5.2)
Alkaline Phosphatase: 187 U/L — ABNORMAL HIGH (ref 39–117)
BUN: 10 mg/dL (ref 6–23)
CO2: 27 mEq/L (ref 19–32)
Calcium: 9.2 mg/dL (ref 8.4–10.5)
Chloride: 88 mEq/L — ABNORMAL LOW (ref 96–112)
Creatinine, Ser: 0.8 mg/dL (ref 0.40–1.50)
GFR: 96.62 mL/min (ref >60.00)
Glucose, Bld: 127 mg/dL — ABNORMAL HIGH (ref 70–99)
Potassium: 4.7 mEq/L (ref 3.5–5.1)
Sodium: 121 mEq/L — CL (ref 135–145)
Total Bilirubin: 0.5 mg/dL (ref 0.2–1.2)
Total Protein: 5.9 g/dL — ABNORMAL LOW (ref 6.0–8.3)

## 2018-08-19 LAB — LIPID PANEL
Cholesterol: 122 mg/dL (ref 0–200)
HDL: 48.4 mg/dL (ref >39.00)
LDL Cholesterol: 63 mg/dL (ref 0–99)
NonHDL: 73.28
Total CHOL/HDL Ratio: 3
Triglycerides: 52 mg/dL (ref 0.0–149.0)
VLDL: 10.4 mg/dL (ref 0.0–40.0)

## 2018-08-19 NOTE — Telephone Encounter (Signed)
Spoke with pt  Pt has app with dr Marin Olp

## 2018-08-19 NOTE — Telephone Encounter (Signed)
"  CRITICAL VALUE STICKER  CRITICAL VALUE:Sodium 121  RECEIVER (on-site recipient of call):Finlay Godbee P.  DATE & TIME NOTIFIED: 08-19-18 1325  MESSENGER (representative from lab):Sei  MD NOTIFIED: Lowne/Sheketia  TIME OF NOTIFICATION:1335  RESPONSE:

## 2018-08-21 ENCOUNTER — Encounter: Payer: Self-pay | Admitting: *Deleted

## 2018-08-21 ENCOUNTER — Inpatient Hospital Stay (HOSPITAL_BASED_OUTPATIENT_CLINIC_OR_DEPARTMENT_OTHER): Payer: 59 | Admitting: Hematology & Oncology

## 2018-08-21 ENCOUNTER — Telehealth: Payer: Self-pay | Admitting: *Deleted

## 2018-08-21 ENCOUNTER — Other Ambulatory Visit: Payer: Self-pay

## 2018-08-21 ENCOUNTER — Inpatient Hospital Stay: Payer: 59 | Attending: Hematology & Oncology

## 2018-08-21 ENCOUNTER — Encounter: Payer: Self-pay | Admitting: Hematology & Oncology

## 2018-08-21 VITALS — BP 143/69 | HR 79 | Temp 98.1°F | Wt 196.4 lb

## 2018-08-21 DIAGNOSIS — C778 Secondary and unspecified malignant neoplasm of lymph nodes of multiple regions: Secondary | ICD-10-CM | POA: Diagnosis not present

## 2018-08-21 DIAGNOSIS — C641 Malignant neoplasm of right kidney, except renal pelvis: Secondary | ICD-10-CM | POA: Insufficient documentation

## 2018-08-21 DIAGNOSIS — K769 Liver disease, unspecified: Secondary | ICD-10-CM

## 2018-08-21 DIAGNOSIS — C7800 Secondary malignant neoplasm of unspecified lung: Secondary | ICD-10-CM

## 2018-08-21 DIAGNOSIS — C649 Malignant neoplasm of unspecified kidney, except renal pelvis: Secondary | ICD-10-CM

## 2018-08-21 DIAGNOSIS — E871 Hypo-osmolality and hyponatremia: Secondary | ICD-10-CM | POA: Insufficient documentation

## 2018-08-21 DIAGNOSIS — R740 Nonspecific elevation of levels of transaminase and lactic acid dehydrogenase [LDH]: Secondary | ICD-10-CM | POA: Diagnosis not present

## 2018-08-21 DIAGNOSIS — Z720 Tobacco use: Secondary | ICD-10-CM

## 2018-08-21 DIAGNOSIS — C7951 Secondary malignant neoplasm of bone: Secondary | ICD-10-CM | POA: Insufficient documentation

## 2018-08-21 DIAGNOSIS — Z7189 Other specified counseling: Secondary | ICD-10-CM

## 2018-08-21 DIAGNOSIS — C61 Malignant neoplasm of prostate: Secondary | ICD-10-CM | POA: Diagnosis not present

## 2018-08-21 DIAGNOSIS — C787 Secondary malignant neoplasm of liver and intrahepatic bile duct: Secondary | ICD-10-CM | POA: Diagnosis not present

## 2018-08-21 HISTORY — DX: Other specified counseling: Z71.89

## 2018-08-21 LAB — CBC WITH DIFFERENTIAL (CANCER CENTER ONLY)
Abs Immature Granulocytes: 0.03 10*3/uL (ref 0.00–0.07)
Basophils Absolute: 0 10*3/uL (ref 0.0–0.1)
Basophils Relative: 1 %
Eosinophils Absolute: 0.1 10*3/uL (ref 0.0–0.5)
Eosinophils Relative: 3 %
HCT: 34.6 % — ABNORMAL LOW (ref 39.0–52.0)
Hemoglobin: 12.1 g/dL — ABNORMAL LOW (ref 13.0–17.0)
Immature Granulocytes: 1 %
Lymphocytes Relative: 5 %
Lymphs Abs: 0.3 10*3/uL — ABNORMAL LOW (ref 0.7–4.0)
MCH: 30.3 pg (ref 26.0–34.0)
MCHC: 35 g/dL (ref 30.0–36.0)
MCV: 86.5 fL (ref 80.0–100.0)
Monocytes Absolute: 0.5 10*3/uL (ref 0.1–1.0)
Monocytes Relative: 8 %
Neutro Abs: 4.4 10*3/uL (ref 1.7–7.7)
Neutrophils Relative %: 82 %
Platelet Count: 161 10*3/uL (ref 150–400)
RBC: 4 MIL/uL — ABNORMAL LOW (ref 4.22–5.81)
RDW: 13.4 % (ref 11.5–15.5)
WBC Count: 5.4 10*3/uL (ref 4.0–10.5)
nRBC: 0 % (ref 0.0–0.2)

## 2018-08-21 LAB — CMP (CANCER CENTER ONLY)
ALT: 75 U/L — ABNORMAL HIGH (ref 0–44)
AST: 47 U/L — ABNORMAL HIGH (ref 15–41)
Albumin: 4.1 g/dL (ref 3.5–5.0)
Alkaline Phosphatase: 197 U/L — ABNORMAL HIGH (ref 38–126)
Anion gap: 7 (ref 5–15)
BUN: 6 mg/dL — ABNORMAL LOW (ref 8–23)
CO2: 26 mmol/L (ref 22–32)
Calcium: 9.6 mg/dL (ref 8.9–10.3)
Chloride: 85 mmol/L — ABNORMAL LOW (ref 98–111)
Creatinine: 0.79 mg/dL (ref 0.61–1.24)
GFR, Est AFR Am: >60 mL/min (ref >60)
GFR, Estimated: >60 mL/min (ref >60)
Glucose, Bld: 168 mg/dL — ABNORMAL HIGH (ref 70–99)
Potassium: 4.3 mmol/L (ref 3.5–5.1)
Sodium: 118 mmol/L — CL (ref 135–145)
Total Bilirubin: 0.6 mg/dL (ref 0.3–1.2)
Total Protein: 5.9 g/dL — ABNORMAL LOW (ref 6.5–8.1)

## 2018-08-21 LAB — LACTATE DEHYDROGENASE: LDH: 389 U/L — ABNORMAL HIGH (ref 98–192)

## 2018-08-21 MED ORDER — DEMECLOCYCLINE HCL 300 MG PO TABS: 300.0000 mg | ORAL_TABLET | Freq: Two times a day (BID) | ORAL | 2 refills | Status: DC

## 2018-08-21 MED FILL — DEMECLOCYCLINE 150 MG TAB: 150 | 30 days supply | Qty: 120 | Fill #0
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
## 2018-08-21 NOTE — Progress Notes (Signed)
..  Initial RN Navigator Patient Visit  Name: Taylor Elliott Date of Referral : 08/14/2018 Diagnosis: Hx of Renal and Prostate Ca with new lung nodules  Met with patient prior to their visit with MD. Hanley Seamen patient "Your Patient Navigator" handout which explains my role, areas in which I am able to help, and all the contact information for myself and the office. Also gave patient MD and Navigator business card. Reviewed with patient the general overview of expected course after initial diagnosis and time frame for all steps to be completed.  Patient completed visit with Dr. Marin Olp  Revisited with patient after MD visit. Patient will need   - PET Scan: Scheduled for 09/02/2018 at 0730AM - Liver Biopsy: IR will reach out to patient to schedule - [facility] placement: IR will reach out to schedule patient  Patient instructed to call me if they haven't been scheduled for either procedure by Tuesday morning.   Patient understands all follow up procedures and expectations. They have my number to reach out for any further clarification or additional needs. Will call patient in 5-7 days to see if any further needs have presented, or if patient has any further questions or needs.

## 2018-08-21 NOTE — Telephone Encounter (Signed)
Richardson Landry from lab informed me of a critical Sodium level of 118. This is a NEW patient appointment today. Results given to MD.

## 2018-08-22 ENCOUNTER — Telehealth: Payer: Self-pay | Admitting: Hematology & Oncology

## 2018-08-22 ENCOUNTER — Telehealth: Payer: Self-pay | Admitting: *Deleted

## 2018-08-22 LAB — PSA, TOTAL AND FREE
PSA, Free Pct: UNDETERMINED %
PSA, Free: <0.02 ng/mL
Prostate Specific Ag, Serum: <0.1 ng/mL (ref 0.0–4.0)

## 2018-08-22 NOTE — Telephone Encounter (Signed)
Spoke with patient regarding appointment for Lab being added  Per 1/23 los

## 2018-08-22 NOTE — Telephone Encounter (Signed)
-----   Message from Ann Held, DO sent at 08/20/2018 12:52 PM EST ----- I spoke with mr Frerking yesterday.   Is he drinking enough fluid---- may need to be adding salt.  Dr Marin Olp does not think the low sodium is from the cancer.  We really do need to recheck his labs---  order is in ----- Message ----- From: Volanda Napoleon, MD Sent: 08/20/2018   6:22 AM EST To: Ann Held, DO  Yvonne:  I doubt this is from mets.  With a Chloride of only 88, I suspect hypovolemia is the reason for low sodium. [name] ----- Message ----- From: Ann Held, DO Sent: 08/19/2018   3:03 PM EST To: Volanda Napoleon, MD  I received Jahn's labs today--- sodium has dropped to 121.  Pt states he saw Dr Alinda Money who is worried about mets to lung--- he has an appointment to see you on Thursday.  His liver function is also increasing.  He states he feels fine .  I was going to repeat the labs tomorrrow but he states he has an appointment with you Thursday.  I told him to go to the ER if he starts becoming symptomatic.   Is there something else I should do?   Could all this be mets?  He said Dr Alinda Money had talked to you.   It caught me off guard--- he was doing so well.   Kendrick Fries

## 2018-08-25 ENCOUNTER — Inpatient Hospital Stay: Payer: 59

## 2018-08-25 ENCOUNTER — Telehealth: Payer: Self-pay | Admitting: *Deleted

## 2018-08-25 DIAGNOSIS — C787 Secondary malignant neoplasm of liver and intrahepatic bile duct: Secondary | ICD-10-CM | POA: Diagnosis not present

## 2018-08-25 DIAGNOSIS — C61 Malignant neoplasm of prostate: Secondary | ICD-10-CM

## 2018-08-25 DIAGNOSIS — Z720 Tobacco use: Secondary | ICD-10-CM | POA: Diagnosis not present

## 2018-08-25 DIAGNOSIS — E871 Hypo-osmolality and hyponatremia: Secondary | ICD-10-CM | POA: Diagnosis not present

## 2018-08-25 DIAGNOSIS — C641 Malignant neoplasm of right kidney, except renal pelvis: Secondary | ICD-10-CM | POA: Diagnosis not present

## 2018-08-25 DIAGNOSIS — R740 Nonspecific elevation of levels of transaminase and lactic acid dehydrogenase [LDH]: Secondary | ICD-10-CM | POA: Diagnosis not present

## 2018-08-25 DIAGNOSIS — C7951 Secondary malignant neoplasm of bone: Secondary | ICD-10-CM | POA: Diagnosis not present

## 2018-08-25 LAB — BASIC METABOLIC PANEL - CANCER CENTER ONLY
Anion gap: 7 (ref 5–15)
BUN: 15 mg/dL (ref 8–23)
CO2: 27 mmol/L (ref 22–32)
Calcium: 10.2 mg/dL (ref 8.9–10.3)
Chloride: 96 mmol/L — ABNORMAL LOW (ref 98–111)
Creatinine: 0.9 mg/dL (ref 0.61–1.24)
GFR, Est AFR Am: >60 mL/min (ref >60)
GFR, Estimated: >60 mL/min (ref >60)
Glucose, Bld: 127 mg/dL — ABNORMAL HIGH (ref 70–99)
Potassium: 4.5 mmol/L (ref 3.5–5.1)
Sodium: 130 mmol/L — ABNORMAL LOW (ref 135–145)

## 2018-08-25 NOTE — Telephone Encounter (Signed)
Patient notified per order of Dr. Marin Olp that sodium is 130 and to continue Declomycin as ordered.  Patient verbalizes an understanding of MD instructions.  Pt is requesting prescription for Vicodin for shoulder pain from torn rotator cuff.  Dr. Marin Olp notified and order received for pt to call Dr. Sharol Given, for pain medicine.  Patient notified and verbalizes an understanding to contact Dr. Sharol Given for pain medicine.

## 2018-08-29 ENCOUNTER — Other Ambulatory Visit: Payer: Self-pay | Admitting: Radiology

## 2018-08-29 ENCOUNTER — Encounter: Payer: Self-pay | Admitting: *Deleted

## 2018-09-01 ENCOUNTER — Other Ambulatory Visit: Payer: Self-pay | Admitting: Hematology & Oncology

## 2018-09-01 ENCOUNTER — Ambulatory Visit (HOSPITAL_COMMUNITY)
Admission: RE | Admit: 2018-09-01 | Discharge: 2018-09-01 | Disposition: A | Discharge status: Home / Self Care | Payer: 59 | Source: Ambulatory Visit | Attending: Hematology & Oncology | Admitting: Hematology & Oncology

## 2018-09-01 ENCOUNTER — Other Ambulatory Visit: Payer: Self-pay

## 2018-09-01 ENCOUNTER — Encounter (HOSPITAL_COMMUNITY): Payer: Self-pay

## 2018-09-01 DIAGNOSIS — C227 Other specified carcinomas of liver: Secondary | ICD-10-CM | POA: Diagnosis not present

## 2018-09-01 DIAGNOSIS — C787 Secondary malignant neoplasm of liver and intrahepatic bile duct: Secondary | ICD-10-CM | POA: Insufficient documentation

## 2018-09-01 DIAGNOSIS — R160 Hepatomegaly, not elsewhere classified: Secondary | ICD-10-CM | POA: Diagnosis not present

## 2018-09-01 DIAGNOSIS — K219 Gastro-esophageal reflux disease without esophagitis: Secondary | ICD-10-CM | POA: Diagnosis not present

## 2018-09-01 DIAGNOSIS — Z8546 Personal history of malignant neoplasm of prostate: Secondary | ICD-10-CM | POA: Insufficient documentation

## 2018-09-01 DIAGNOSIS — I10 Essential (primary) hypertension: Secondary | ICD-10-CM | POA: Insufficient documentation

## 2018-09-01 DIAGNOSIS — C649 Malignant neoplasm of unspecified kidney, except renal pelvis: Secondary | ICD-10-CM

## 2018-09-01 DIAGNOSIS — F17210 Nicotine dependence, cigarettes, uncomplicated: Secondary | ICD-10-CM | POA: Insufficient documentation

## 2018-09-01 DIAGNOSIS — Z79899 Other long term (current) drug therapy: Secondary | ICD-10-CM | POA: Diagnosis not present

## 2018-09-01 DIAGNOSIS — Z85528 Personal history of other malignant neoplasm of kidney: Secondary | ICD-10-CM | POA: Insufficient documentation

## 2018-09-01 DIAGNOSIS — M21372 Foot drop, left foot: Secondary | ICD-10-CM | POA: Insufficient documentation

## 2018-09-01 DIAGNOSIS — K769 Liver disease, unspecified: Secondary | ICD-10-CM

## 2018-09-01 DIAGNOSIS — G629 Polyneuropathy, unspecified: Secondary | ICD-10-CM | POA: Diagnosis not present

## 2018-09-01 DIAGNOSIS — Z7982 Long term (current) use of aspirin: Secondary | ICD-10-CM | POA: Diagnosis not present

## 2018-09-01 DIAGNOSIS — E785 Hyperlipidemia, unspecified: Secondary | ICD-10-CM | POA: Insufficient documentation

## 2018-09-01 DIAGNOSIS — Z452 Encounter for adjustment and management of vascular access device: Secondary | ICD-10-CM | POA: Diagnosis not present

## 2018-09-01 DIAGNOSIS — C7800 Secondary malignant neoplasm of unspecified lung: Principal | ICD-10-CM

## 2018-09-01 DIAGNOSIS — F419 Anxiety disorder, unspecified: Secondary | ICD-10-CM | POA: Diagnosis not present

## 2018-09-01 HISTORY — PX: IR US GUIDE BX ASP/DRAIN: IMG2392

## 2018-09-01 HISTORY — PX: IR IMAGING GUIDED PORT INSERTION: IMG5740

## 2018-09-01 LAB — COMPREHENSIVE METABOLIC PANEL
ALT: 149 U/L — ABNORMAL HIGH (ref 0–44)
AST: 87 U/L — ABNORMAL HIGH (ref 15–41)
Albumin: 3.5 g/dL (ref 3.5–5.0)
Alkaline Phosphatase: 241 U/L — ABNORMAL HIGH (ref 38–126)
Anion gap: 9 (ref 5–15)
BUN: 37 mg/dL — ABNORMAL HIGH (ref 8–23)
CO2: 23 mmol/L (ref 22–32)
Calcium: 9.6 mg/dL (ref 8.9–10.3)
Chloride: 109 mmol/L (ref 98–111)
Creatinine, Ser: 1.54 mg/dL — ABNORMAL HIGH (ref 0.61–1.24)
GFR calc Af Amer: 54 mL/min — ABNORMAL LOW (ref >60)
GFR calc non Af Amer: 46 mL/min — ABNORMAL LOW (ref >60)
Glucose, Bld: 125 mg/dL — ABNORMAL HIGH (ref 70–99)
Potassium: 4.2 mmol/L (ref 3.5–5.1)
Sodium: 141 mmol/L (ref 135–145)
Total Bilirubin: 0.8 mg/dL (ref 0.3–1.2)
Total Protein: 6.4 g/dL — ABNORMAL LOW (ref 6.5–8.1)

## 2018-09-01 LAB — CBC WITH DIFFERENTIAL/PLATELET
Abs Immature Granulocytes: 0.03 10*3/uL (ref 0.00–0.07)
Basophils Absolute: 0 10*3/uL (ref 0.0–0.1)
Basophils Relative: 0 %
Eosinophils Absolute: 0 10*3/uL (ref 0.0–0.5)
Eosinophils Relative: 0 %
HCT: 39.6 % (ref 39.0–52.0)
Hemoglobin: 12.5 g/dL — ABNORMAL LOW (ref 13.0–17.0)
Immature Granulocytes: 0 %
Lymphocytes Relative: 6 %
Lymphs Abs: 0.5 10*3/uL — ABNORMAL LOW (ref 0.7–4.0)
MCH: 30.1 pg (ref 26.0–34.0)
MCHC: 31.6 g/dL (ref 30.0–36.0)
MCV: 95.4 fL (ref 80.0–100.0)
Monocytes Absolute: 0.5 10*3/uL (ref 0.1–1.0)
Monocytes Relative: 7 %
Neutro Abs: 6.1 10*3/uL (ref 1.7–7.7)
Neutrophils Relative %: 87 %
Platelets: 196 10*3/uL (ref 150–400)
RBC: 4.15 MIL/uL — ABNORMAL LOW (ref 4.22–5.81)
RDW: 14.4 % (ref 11.5–15.5)
WBC: 7.2 10*3/uL (ref 4.0–10.5)
nRBC: 0 % (ref 0.0–0.2)

## 2018-09-01 LAB — PROTIME-INR
INR: 1
Prothrombin Time: 13.1 seconds (ref 11.4–15.2)

## 2018-09-01 MED ORDER — CEFAZOLIN SODIUM-DEXTROSE 2-4 GM/100ML-% IV SOLN
2.0000 g | INTRAVENOUS | Status: AC
  Administered 2018-09-01: 2 g via INTRAVENOUS

## 2018-09-01 MED ORDER — LIDOCAINE HCL 1 % IJ SOLN
INTRAMUSCULAR | Status: AC
  Filled 2018-09-01: qty 40

## 2018-09-01 MED ORDER — FENTANYL CITRATE (PF) 100 MCG/2ML IJ SOLN
INTRAMUSCULAR | Status: AC | PRN
  Administered 2018-09-01 (×2): 50 ug via INTRAVENOUS

## 2018-09-01 MED ORDER — MIDAZOLAM HCL 2 MG/2ML IJ SOLN
INTRAMUSCULAR | Status: AC | PRN
  Administered 2018-09-01 (×4): 1 mg via INTRAVENOUS

## 2018-09-01 MED ORDER — HEPARIN SOD (PORK) LOCK FLUSH 100 UNIT/ML IV SOLN
INTRAVENOUS | Status: AC
  Filled 2018-09-01: qty 5

## 2018-09-01 MED ORDER — LORAZEPAM 2 MG/ML IJ SOLN
0.5000 mg | Freq: Once | INTRAMUSCULAR | Status: AC
  Administered 2018-09-01: 0.5 mg via INTRAVENOUS

## 2018-09-01 MED ORDER — GELATIN ABSORBABLE 12-7 MM EX MISC
CUTANEOUS | Status: AC | PRN
  Administered 2018-09-01: 1 via TOPICAL

## 2018-09-01 MED ORDER — HEPARIN SOD (PORK) LOCK FLUSH 100 UNIT/ML IV SOLN
INTRAVENOUS | Status: AC | PRN
  Administered 2018-09-01: 500 [IU] via INTRAVENOUS

## 2018-09-01 MED ORDER — MIDAZOLAM HCL 2 MG/2ML IJ SOLN
INTRAMUSCULAR | Status: AC
  Filled 2018-09-01: qty 4

## 2018-09-01 MED ORDER — SODIUM CHLORIDE 0.9 % IV SOLN
INTRAVENOUS | Status: DC
  Administered 2018-09-01: 10:00:00 via INTRAVENOUS

## 2018-09-01 MED ORDER — FENTANYL CITRATE (PF) 100 MCG/2ML IJ SOLN
INTRAMUSCULAR | Status: AC
  Filled 2018-09-01: qty 2

## 2018-09-01 MED ORDER — CEFAZOLIN SODIUM-DEXTROSE 2-4 GM/100ML-% IV SOLN
INTRAVENOUS | Status: AC
  Administered 2018-09-01: 2 g via INTRAVENOUS
  Filled 2018-09-01: qty 100

## 2018-09-01 MED ORDER — GELATIN ABSORBABLE 12-7 MM EX MISC
CUTANEOUS | Status: AC
  Filled 2018-09-01: qty 1

## 2018-09-01 MED ORDER — LIDOCAINE HCL 1 % IJ SOLN
INTRAMUSCULAR | Status: AC | PRN
  Administered 2018-09-01 (×2): 10 mL

## 2018-09-01 MED FILL — ALLOPURINOL 100 MG TABS: 100 | 90 days supply | Qty: 180 | Fill #3

## 2018-09-01 NOTE — Discharge Instructions (Signed)
Moderate Conscious Sedation, Adult, Care After These instructions provide you with information about caring for yourself after your procedure. Your health care provider may also give you more specific instructions. Your treatment has been planned according to current medical practices, but problems sometimes occur. Call your health care provider if you have any problems or questions after your procedure. What can I expect after the procedure? After your procedure, it is common:  To feel sleepy for several hours.  To feel clumsy and have poor balance for several hours.  To have poor judgment for several hours.  To vomit if you eat too soon. Follow these instructions at home: For at least 24 hours after the procedure:   Do not: ? Participate in activities where you could fall or become injured. ? Drive. ? Use heavy machinery. ? Drink alcohol. ? Take sleeping pills or medicines that cause drowsiness. ? Make important decisions or sign legal documents. ? Take care of children on your own.  Rest. Eating and drinking  Follow the diet recommended by your health care provider.  If you vomit: ? Drink water, juice, or soup when you can drink without vomiting. ? Make sure you have little or no nausea before eating solid foods. General instructions  Have a responsible adult stay with you until you are awake and alert.  Take over-the-counter and prescription medicines only as told by your health care provider.  If you smoke, do not smoke without supervision.  Keep all follow-up visits as told by your health care provider. This is important. Contact a health care provider if:  You keep feeling nauseous or you keep vomiting.  You feel light-headed.  You develop a rash.  You have a fever. Get help right away if:  You have trouble breathing. This information is not intended to replace advice given to you by your health care provider. Make sure you discuss any questions you have  with your health care provider. Document Released: 05/06/2013 Document Revised: 12/19/2015 Document Reviewed: 11/05/2015 Elsevier Interactive Patient Education  2019 [place] may remove your dressing and shower tomorrow.    DO NOT use EMLA cream for 2 weeks after port placement as this cream will remove surgical glue on your incision.  Implanted Port Insertion, Care After This sheet gives you information about how to care for yourself after your procedure. Your health care provider may also give you more specific instructions. If you have problems or questions, contact your health care provider. What can I expect after the procedure? After the procedure, it is common to have:  Discomfort at the port insertion site.  Bruising on the skin over the port. This should improve over 3-4 days. Follow these instructions at home: [facility] care  After your port is placed, you will get a manufacturer's information card. The card has information about your port. Keep this card with you at all times.  Take care of the port as told by your health care provider. Ask your health care provider if you or a family member can get training for taking care of the port at home. A home health care nurse may also take care of the port.  Make sure to remember what type of port you have. Incision care      Follow instructions from your health care provider about how to take care of your port insertion site. Make sure you: ? Wash your hands with soap and water before and after you change your bandage (  dressing). If soap and water are not available, use hand sanitizer. ? Change your dressing as told by your health care provider. ? Leave stitches (sutures), skin glue, or adhesive strips in place. These skin closures may need to stay in place for 2 weeks or longer. If adhesive strip edges start to loosen and curl up, you may trim the loose edges. Do not remove adhesive strips completely unless your health  care provider tells you to do that.  Check your port insertion site every day for signs of infection. Check for: ? Redness, swelling, or pain. ? Fluid or blood. ? Warmth. ? Pus or a bad smell. Activity  Return to your normal activities as told by your health care provider. Ask your health care provider what activities are safe for you.  Do not lift anything that is heavier than 10 lb (4.5 kg), or the limit that you are told, until your health care provider says that it is safe. General instructions  Take over-the-counter and prescription medicines only as told by your health care provider.  Do not take baths, swim, or use a hot tub until your health care provider approves. Ask your health care provider if you may take showers. You may only be allowed to take sponge baths.  Do not drive for 24 hours if you were given a sedative during your procedure.  Wear a medical alert bracelet in case of an emergency. This will tell any health care providers that you have a port.  Keep all follow-up visits as told by your health care provider. This is important. Contact a health care provider if:  You cannot flush your port with saline as directed, or you cannot draw blood from the port.  You have a fever or chills.  You have redness, swelling, or pain around your port insertion site.  You have fluid or blood coming from your port insertion site.  Your port insertion site feels warm to the touch.  You have pus or a bad smell coming from the port insertion site. Get help right away if:  You have chest pain or shortness of breath.  You have bleeding from your port that you cannot control. Summary  Take care of the port as told by your health care provider. Keep the manufacturer's information card with you at all times.  Change your dressing as told by your health care provider.  Contact a health care provider if you have a fever or chills or if you have redness, swelling, or pain  around your port insertion site.  Keep all follow-up visits as told by your health care provider. This information is not intended to replace advice given to you by your health care provider. Make sure you discuss any questions you have with your health care provider. Document Released: 05/06/2013 Document Revised: 02/11/2018 Document Reviewed: 02/11/2018 Elsevier Interactive Patient Education  2019 [place].   Liver Biopsy, Care After These instructions give you information on caring for yourself after your procedure. Your doctor may also give you more specific instructions. Call your doctor if you have any problems or questions after your procedure. What can I expect after the procedure? After the procedure, it is common to have:  Pain and soreness where the biopsy was done.  Bruising around the area where the biopsy was done.  Sleepiness and be tired for a few days. Follow these instructions at home: Medicines  Take over-the-counter and prescription medicines only as told by your doctor.  If you  were prescribed an antibiotic medicine, take it as told by your doctor. Do not stop taking the antibiotic even if you start to feel better.  Do not take medicines such as aspirin and ibuprofen. These medicines can thin your blood. Do not take these medicines unless your doctor tells you to take them.  If you are taking prescription pain medicine, take actions to prevent or treat constipation. Your doctor may recommend that you: ? Drink enough fluid to keep your pee (urine) clear or pale yellow. ? Take over-the-counter or prescription medicines. ? Eat foods that are high in fiber, such as fresh fruits and vegetables, whole grains, and beans. ? Limit foods that are high in fat and processed sugars, such as fried and sweet foods. Caring for your cut  Follow instructions from your doctor about how to take care of your cuts from surgery (incisions). Make sure you: ? Wash your hands with  soap and water before you change your bandage (dressing). If you cannot use soap and water, use hand sanitizer. ? Change your bandage as told by your doctor. ? Leave stitches (sutures), skin glue, or skin tape (adhesive) strips in place. They may need to stay in place for 2 weeks or longer. If tape strips get loose and curl up, you may trim the loose edges. Do not remove tape strips completely unless your doctor says it is okay.  Check your cuts every day for signs of infection. Check for: ? Redness, swelling, or more pain. ? Fluid or blood. ? Pus or a bad smell. ? Warmth.  Do not take baths, swim, or use a hot tub until your doctor says it is okay to do so. Activity   Rest at home for 1-2 days or as told by your doctor. ? Avoid sitting for a long time without moving. Get up to take short walks every 1-2 hours.  Return to your normal activities as told by your doctor. Ask what activities are safe for you.  Do not do these things in the first 24 hours: ? Drive. ? Use machinery. ? Take a bath or shower.  Do not lift more than 10 pounds (4.5 kg) or play contact sports for the first 2 weeks. General instructions   Do not drink alcohol in the first week after the procedure.  Have someone stay with you for at least 24 hours after the procedure.  Get your test results. Ask your doctor or the department that is doing the test: ? When will my results be ready? ? How will I get my results? ? What are my treatment options? ? What other tests do I need? ? What are my next steps?  Keep all follow-up visits as told by your doctor. This is important. Contact a doctor if:  A cut bleeds and leaves more than just a small spot of blood.  A cut is red, puffs up (swells), or hurts more than before.  Fluid or something else comes from a cut.  A cut smells bad.  You have a fever or chills. Get help right away if:  You have swelling, bloating, or pain in your belly (abdomen).  You get  dizzy or faint.  You have a rash.  You feel sick to your stomach (nauseous) or throw up (vomit).  You have trouble breathing, feel short of breath, or feel faint.  Your chest hurts.  You have problems talking or seeing.  You have trouble with your balance or moving your arms or  legs. Summary  After the procedure, it is common to have pain, soreness, bruising, and tiredness.  Your doctor will tell you how to take care of yourself at home. Change your bandage, take your medicines, and limit your activities as told by your doctor.  Call your doctor if you have symptoms of infection. Get help right away if your belly swells, your cut bleeds a lot, or you have trouble talking or breathing. This information is not intended to replace advice given to you by your health care provider. Make sure you discuss any questions you have with your health care provider. Document Released: 04/24/2008 Document Revised: 07/26/2017 Document Reviewed: 07/26/2017 Elsevier Interactive Patient Education  2019 [employer].
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
## 2018-09-01 NOTE — Progress Notes (Signed)
1.5 mg Ativan wasted from remainder of 2 mg vial as previously given (see MAR). This is witnessed by Lavon Paganini, RN

## 2018-09-01 NOTE — Procedures (Signed)
Interventional Radiology Procedure Note  Procedure: US guided liver mass biopsy.  Mx 18 g core biopsy.  Complications: None Recommendations:  - Ok to shower tomorrow - Do not submerge for 7 days - Routine care  - proceeding now with port placement  Signed,  Dulcy Fanny. Earleen Newport, DO

## 2018-09-01 NOTE — Procedures (Signed)
Interventional Radiology Procedure Note  Procedure: Placement of a right IJ approach single lumen PowerPort.  Tip is positioned at the superior cavoatrial junction and catheter is ready for immediate use.  Complications: None Recommendations:  - Ok to shower tomorrow - Do not submerge for 7 days - Routine line care   Signed,  Joniyah Mallinger S. Oseph Imburgia, DO   

## 2018-09-01 NOTE — Sedation Documentation (Signed)
Liver bx finished

## 2018-09-02 ENCOUNTER — Telehealth (INDEPENDENT_AMBULATORY_CARE_PROVIDER_SITE_OTHER): Payer: Self-pay | Admitting: Orthopedic Surgery

## 2018-09-02 ENCOUNTER — Encounter (HOSPITAL_COMMUNITY)
Admission: RE | Admit: 2018-09-02 | Discharge: 2018-09-02 | Disposition: A | Discharge status: Home / Self Care | Payer: 59 | Source: Ambulatory Visit | Attending: Hematology & Oncology | Admitting: Hematology & Oncology

## 2018-09-02 DIAGNOSIS — C3490 Malignant neoplasm of unspecified part of unspecified bronchus or lung: Secondary | ICD-10-CM | POA: Diagnosis not present

## 2018-09-02 DIAGNOSIS — C7800 Secondary malignant neoplasm of unspecified lung: Secondary | ICD-10-CM | POA: Diagnosis not present

## 2018-09-02 DIAGNOSIS — C649 Malignant neoplasm of unspecified kidney, except renal pelvis: Secondary | ICD-10-CM | POA: Insufficient documentation

## 2018-09-02 DIAGNOSIS — C787 Secondary malignant neoplasm of liver and intrahepatic bile duct: Secondary | ICD-10-CM | POA: Diagnosis not present

## 2018-09-02 DIAGNOSIS — C7951 Secondary malignant neoplasm of bone: Secondary | ICD-10-CM | POA: Diagnosis not present

## 2018-09-02 LAB — GLUCOSE, CAPILLARY: Glucose-Capillary: 117 mg/dL — ABNORMAL HIGH (ref 70–99)

## 2018-09-02 MED ORDER — FLUDEOXYGLUCOSE F - 18 (FDG) INJECTION
9.2700 | Freq: Once | INTRAVENOUS | Status: AC | PRN
  Administered 2018-09-02: 9.27 via INTRAVENOUS

## 2018-09-02 NOTE — Telephone Encounter (Signed)
Need adjustment on brace and wants DUDA to send message/order to [facility] in [facility] to get services on brace, Inter sole is wearing out. They will NOT repair with an order.  Please call patient to advise as he was scrambling with this message and not very clear.  [phone]

## 2018-09-03 ENCOUNTER — Encounter: Payer: Self-pay | Admitting: *Deleted

## 2018-09-04 ENCOUNTER — Encounter: Payer: Self-pay | Admitting: *Deleted

## 2018-09-04 NOTE — Progress Notes (Signed)
Per Dr. Marin Olp, I faxed paperwork to [facility]. Fax number is [phone]. Paperwork placed in brown filing cabinet behind nurses station.

## 2018-09-05 ENCOUNTER — Other Ambulatory Visit: Payer: Self-pay

## 2018-09-05 ENCOUNTER — Encounter: Payer: Self-pay | Admitting: *Deleted

## 2018-09-05 ENCOUNTER — Encounter: Payer: Self-pay | Admitting: Hematology & Oncology

## 2018-09-05 ENCOUNTER — Inpatient Hospital Stay (HOSPITAL_BASED_OUTPATIENT_CLINIC_OR_DEPARTMENT_OTHER): Payer: 59 | Admitting: Hematology & Oncology

## 2018-09-05 ENCOUNTER — Inpatient Hospital Stay: Payer: 59 | Attending: Hematology & Oncology

## 2018-09-05 VITALS — BP 146/83 | HR 91 | Temp 98.3°F | Resp 18 | Wt 192.0 lb

## 2018-09-05 DIAGNOSIS — C3491 Malignant neoplasm of unspecified part of right bronchus or lung: Secondary | ICD-10-CM

## 2018-09-05 DIAGNOSIS — Z8546 Personal history of malignant neoplasm of prostate: Secondary | ICD-10-CM | POA: Diagnosis not present

## 2018-09-05 DIAGNOSIS — L03115 Cellulitis of right lower limb: Secondary | ICD-10-CM | POA: Insufficient documentation

## 2018-09-05 DIAGNOSIS — R599 Enlarged lymph nodes, unspecified: Secondary | ICD-10-CM

## 2018-09-05 DIAGNOSIS — D696 Thrombocytopenia, unspecified: Secondary | ICD-10-CM | POA: Diagnosis not present

## 2018-09-05 DIAGNOSIS — Z5111 Encounter for antineoplastic chemotherapy: Secondary | ICD-10-CM | POA: Diagnosis not present

## 2018-09-05 DIAGNOSIS — C61 Malignant neoplasm of prostate: Secondary | ICD-10-CM

## 2018-09-05 DIAGNOSIS — Z5112 Encounter for antineoplastic immunotherapy: Secondary | ICD-10-CM | POA: Diagnosis not present

## 2018-09-05 DIAGNOSIS — Z85528 Personal history of other malignant neoplasm of kidney: Secondary | ICD-10-CM | POA: Insufficient documentation

## 2018-09-05 DIAGNOSIS — L03114 Cellulitis of left upper limb: Secondary | ICD-10-CM | POA: Diagnosis not present

## 2018-09-05 DIAGNOSIS — Z720 Tobacco use: Secondary | ICD-10-CM

## 2018-09-05 DIAGNOSIS — C7951 Secondary malignant neoplasm of bone: Secondary | ICD-10-CM | POA: Insufficient documentation

## 2018-09-05 DIAGNOSIS — C787 Secondary malignant neoplasm of liver and intrahepatic bile duct: Secondary | ICD-10-CM | POA: Diagnosis not present

## 2018-09-05 DIAGNOSIS — Z5189 Encounter for other specified aftercare: Secondary | ICD-10-CM | POA: Diagnosis not present

## 2018-09-05 HISTORY — DX: Malignant neoplasm of unspecified part of right bronchus or lung: C34.91

## 2018-09-05 LAB — CMP (CANCER CENTER ONLY)
ALT: 183 U/L — ABNORMAL HIGH (ref 0–44)
AST: 130 U/L — ABNORMAL HIGH (ref 15–41)
Albumin: 3.5 g/dL (ref 3.5–5.0)
Alkaline Phosphatase: 335 U/L — ABNORMAL HIGH (ref 38–126)
Anion gap: 8 (ref 5–15)
BUN: 41 mg/dL — ABNORMAL HIGH (ref 8–23)
CO2: 30 mmol/L (ref 22–32)
Calcium: 10.2 mg/dL (ref 8.9–10.3)
Chloride: 108 mmol/L (ref 98–111)
Creatinine: 1.71 mg/dL — ABNORMAL HIGH (ref 0.61–1.24)
GFR, Est AFR Am: 47 mL/min — ABNORMAL LOW (ref >60)
GFR, Estimated: 41 mL/min — ABNORMAL LOW (ref >60)
Glucose, Bld: 162 mg/dL — ABNORMAL HIGH (ref 70–99)
Potassium: 4 mmol/L (ref 3.5–5.1)
Sodium: 146 mmol/L — ABNORMAL HIGH (ref 135–145)
Total Bilirubin: 0.6 mg/dL (ref 0.3–1.2)
Total Protein: 5.5 g/dL — ABNORMAL LOW (ref 6.5–8.1)

## 2018-09-05 LAB — CBC WITH DIFFERENTIAL (CANCER CENTER ONLY)
Abs Immature Granulocytes: 0.05 10*3/uL (ref 0.00–0.07)
Basophils Absolute: 0 10*3/uL (ref 0.0–0.1)
Basophils Relative: 0 %
Eosinophils Absolute: 0 10*3/uL (ref 0.0–0.5)
Eosinophils Relative: 1 %
HCT: 39.3 % (ref 39.0–52.0)
Hemoglobin: 12.4 g/dL — ABNORMAL LOW (ref 13.0–17.0)
Immature Granulocytes: 1 %
Lymphocytes Relative: 6 %
Lymphs Abs: 0.4 10*3/uL — ABNORMAL LOW (ref 0.7–4.0)
MCH: 30.2 pg (ref 26.0–34.0)
MCHC: 31.6 g/dL (ref 30.0–36.0)
MCV: 95.9 fL (ref 80.0–100.0)
Monocytes Absolute: 0.7 10*3/uL (ref 0.1–1.0)
Monocytes Relative: 10 %
Neutro Abs: 5.6 10*3/uL (ref 1.7–7.7)
Neutrophils Relative %: 82 %
Platelet Count: 210 10*3/uL (ref 150–400)
RBC: 4.1 MIL/uL — ABNORMAL LOW (ref 4.22–5.81)
RDW: 14.4 % (ref 11.5–15.5)
WBC Count: 6.8 10*3/uL (ref 4.0–10.5)
nRBC: 0 % (ref 0.0–0.2)

## 2018-09-05 LAB — LACTATE DEHYDROGENASE: LDH: 682 U/L — ABNORMAL HIGH (ref 98–192)

## 2018-09-05 MED ORDER — OXYCODONE HCL 5 MG PO TABS: 5.0000 mg | ORAL_TABLET | Freq: Four times a day (QID) | ORAL | 0 refills | Status: DC | PRN

## 2018-09-05 MED FILL — oxyCODONE HCL 5 MG TABS: 5 | 15 days supply | Qty: 60 | Fill #0

## 2018-09-05 NOTE — Progress Notes (Signed)
Hematology and Oncology Follow Up Visit  Taylor Elliott [id number] 01/14/1951 68 y.o. 09/05/2018   Principle Diagnosis:   Extensive stage small cell lung cancer  SIADH secondary to small cell lung cancer  Current Therapy:    Carboplatinum/etoposide/Tecentriq-cycle #1 to start on 09/10/2018  Demeclocycline 150 mg po BID     Interim History:  Taylor Elliott is back for his second office visit.  We saw him about 10 days ago.  We have moved quickly on this situation that he has.  We did get a liver biopsy.  This was done on 09/01/2018.  The pathology report ([id number]) showed small cell lung cancer.  I am sending the tumor off for molecular profiling.  We did do a PET scan on him.  This was done on 09/02/2018.  The PET scan showed extensive liver metastasis.  The primary is in the right hilum.  He has some multiple bony metastasis.  He does have some mediastinal adenopathy.  He is doing better as the SIADH has resolved with the demeclocycline.  We will decrease the dose of demeclocycline down to 150 mg p.o. twice daily I did hopefully be able to get him off the demeclocycline as we start his treatment for the small cell lung cancer.  He is well aware that this cancer is not curable but definitely treatable.  We have much better success now with since we combine chemotherapy with immunotherapy.  I will utilize carboplatinum with etoposide as the chemotherapy and then add Tecentriq for the immunotherapy.  He already has his Port-A-Cath placed.  He has had some cough.  He has had no hemoptysis.  He has had no change in bowel or bladder habits.  He still has some right hip issues.  There is been no problems with fever.  He has had no headache.  Overall, his performance status is ECOG 1.  Medications:  Current Outpatient Medications:  Marland Kitchen  Multiple Vitamin (MULTIVITAMIN) tablet, Take 1 tablet by mouth daily., Disp: , Rfl:  .  acetaminophen (TYLENOL) 500 MG tablet, Take 1,000 mg by mouth daily  as needed for mild pain or headache. , Disp: , Rfl:  .  allopurinol (ZYLOPRIM) 100 MG tablet, Take 2 tablets (200 mg total) by mouth every morning. (Patient taking differently: Take 100 mg by mouth every morning. ), Disp: 180 tablet, Rfl: 3 .  ALPRAZolam (XANAX) 0.25 MG tablet, Take 1 tablet (0.25 mg total) by mouth 3 (three) times daily as needed for anxiety., Disp: 45 tablet, Rfl: 0 .  amLODipine (NORVASC) 10 MG tablet, TAKE 1 TABLET BY MOUTH EVERY MORNING (Patient taking differently: Take 10 mg by mouth daily. ), Disp: 90 tablet, Rfl: 2 .  aspirin EC 81 MG tablet, Take 81 mg by mouth daily., Disp: , Rfl:  .  calcium carbonate (TUMS - DOSED IN MG ELEMENTAL CALCIUM) 500 MG chewable tablet, Chew 2 tablets by mouth daily as needed for indigestion or heartburn. , Disp: , Rfl:  .  demeclocycline (DECLOMYCIN) 300 MG tablet, Take 1 tablet (300 mg total) by mouth 2 (two) times daily., Disp: 60 tablet, Rfl: 2 .  EPINEPHrine 0.3 mg/0.3 mL IJ SOAJ injection, Inject 0.3 mg into the muscle once., Disp: , Rfl:  .  fenofibrate 160 MG tablet, Take 1 tablet (160 mg total) by mouth daily., Disp: 30 tablet, Rfl: 2 .  lisinopril (PRINIVIL,ZESTRIL) 20 MG tablet, TAKE 1 TABLET BY MOUTH ONCE DAILY (Patient taking differently: Take 20 mg by mouth daily. ), Disp: 90 tablet,  Rfl: 1 .  oxyCODONE (OXY IR/ROXICODONE) 5 MG immediate release tablet, Take 1 tablet (5 mg total) by mouth every 6 (six) hours as needed for severe pain., Disp: 60 tablet, Rfl: 0 .  rosuvastatin (CRESTOR) 10 MG tablet, TAKE 1 TABLET BY MOUTH ONCE DAILY (Patient taking differently: Take 10 mg by mouth daily. ), Disp: 90 tablet, Rfl: 1 .  solifenacin (VESICARE) 10 MG tablet, Take 10 mg by mouth every evening., Disp: , Rfl:  .  traZODone (DESYREL) 50 MG tablet, Take 1 tablet (50 mg total) by mouth at bedtime., Disp: 30 tablet, Rfl: 0 .  UNABLE TO FIND, Med Name: Allergy shots., Disp: , Rfl:   Allergies:  Allergies  Allergen Reactions  . Bee Venom  Anaphylaxis  . Clindamycin/Lincomycin Hives    Past Medical History, Surgical history, Social history, and Family History were reviewed and updated.  Review of Systems: Review of Systems  Constitutional: Negative.   HENT:  Negative.   Eyes: Negative.   Respiratory: Positive for cough.   Cardiovascular: Negative.   Gastrointestinal: Negative.   Endocrine: Negative.   Genitourinary: Negative.    Musculoskeletal: Positive for back pain, myalgias and neck pain.  Skin: Positive for itching and rash.  Neurological: Negative.   Hematological: Negative.   Psychiatric/Behavioral: Negative.     Physical Exam:  weight is 192 lb (87.1 kg). His oral temperature is 98.3 F (36.8 C). His blood pressure is 146/83 (abnormal) and his pulse is 91. His respiration is 18 and oxygen saturation is 99%.   Wt Readings from Last 3 Encounters:  09/05/18 192 lb (87.1 kg)  08/21/18 196 lb 6.4 oz (89.1 kg)  07/22/18 201 lb 12.8 oz (91.5 kg)    Physical Exam Vitals signs reviewed.  HENT:     Head: Normocephalic and atraumatic.  Eyes:     Pupils: Pupils are equal, round, and reactive to light.  Neck:     Musculoskeletal: Normal range of motion.  Cardiovascular:     Rate and Rhythm: Normal rate and regular rhythm.     Heart sounds: Normal heart sounds.  Pulmonary:     Effort: Pulmonary effort is normal.     Breath sounds: Normal breath sounds.  Abdominal:     General: Bowel sounds are normal.     Palpations: Abdomen is soft.     Comments: Abdominal exam shows a slightly obese abdomen.  He is slightly distended.  He does have hepatomegaly.  His liver edge extends below the right costal margin by about 3 cm.  There is no fluid wave.  He has no splenomegaly.  Musculoskeletal: Normal range of motion.        General: No tenderness or deformity.  Lymphadenopathy:     Cervical: No cervical adenopathy.  Skin:    General: Skin is warm and dry.     Findings: No erythema or rash.  Neurological:      Mental Status: He is alert and oriented to person, place, and time.  Psychiatric:        Behavior: Behavior normal.        Thought Content: Thought content normal.        Judgment: Judgment normal.      Lab Results  Component Value Date   WBC 6.8 09/05/2018   HGB 12.4 (L) 09/05/2018   HCT 39.3 09/05/2018   MCV 95.9 09/05/2018   PLT 210 09/05/2018     Chemistry      Component Value Date/Time   NA 146 (  H) 09/05/2018 1032   NA 135 (L) 07/12/2015 0813   K 4.0 09/05/2018 1032   K 4.5 07/12/2015 0813   CL 108 09/05/2018 1032   CO2 30 09/05/2018 1032   CO2 24 07/12/2015 0813   BUN 41 (H) 09/05/2018 1032   BUN 15.7 07/12/2015 0813   CREATININE 1.71 (H) 09/05/2018 1032   CREATININE 1.6 (H) 07/12/2015 0813      Component Value Date/Time   CALCIUM 10.2 09/05/2018 1032   CALCIUM 10.1 07/12/2015 0813   ALKPHOS 335 (H) 09/05/2018 1032   ALKPHOS 115 07/12/2015 0813   AST 130 (H) 09/05/2018 1032   AST 22 07/12/2015 0813   ALT 183 (H) 09/05/2018 1032   ALT 24 07/12/2015 0813   BILITOT 0.6 09/05/2018 1032   BILITOT 0.46 07/12/2015 0813       Impression and Plan: Taylor Elliott is a 68 year old white male.  He has a past history of renal cell carcinoma and also prostate cancer.  Now, he has a third malignancy.  This is metastatic small cell lung cancer.  He does smoke.  He is still smoking.  He is trying to cut back on this.  He told me that when he was younger, he had a lot of exposure to argon and mercury.  I gave him information sheets about each of the chemotherapy medications and the immunotherapy drug.  We will clearly need to use Neulasta to help stimulate his white blood cells after chemotherapy.  I went over the side effects.  I told him about the diarrhea that can be associated with immunotherapy.  Again, I think the success rate with chemotherapy should be about 80%.  He is already on allopurinol as I would be worried about tumor lysis given his bulk of disease.  I  spent about 45 minutes with he and his wife.  I answered all of their questions.  We will start his chemotherapy on 09/10/2018.  Para bowel see him back on October 01, 2018 which would be the first day of his second cycle of treatment.  We will do a follow-up PET scan after his second cycle of treatment.   Volanda Napoleon, MD 2/7/20203:02 PM

## 2018-09-05 NOTE — Progress Notes (Signed)
START ON PATHWAY REGIMEN - Small Cell Lung     Cycles 1 through 4, every 21 days:     Atezolizumab      Carboplatin      Etoposide    Cycles 5 and beyond, every 21 days:     Atezolizumab   **Always confirm dose/schedule in your pharmacy ordering system**  Patient Characteristics: Newly Diagnosed, Preoperative or Nonsurgical Candidate (Clinical Staging), First Line, Extensive Stage Therapeutic Status: Newly Diagnosed, Preoperative or Nonsurgical Candidate (Clinical Staging) AJCC T Category: cT3 AJCC N Category: cN2 AJCC M Category: pM1c AJCC 8 Stage Grouping: IVB Stage Classification: Extensive Intent of Therapy: Non-Curative / Palliative Intent, Discussed with Patient

## 2018-09-09 ENCOUNTER — Encounter: Payer: Self-pay | Admitting: *Deleted

## 2018-09-09 ENCOUNTER — Telehealth: Payer: Self-pay | Admitting: Hematology & Oncology

## 2018-09-09 ENCOUNTER — Encounter: Payer: Self-pay | Admitting: Hematology & Oncology

## 2018-09-09 ENCOUNTER — Other Ambulatory Visit: Payer: Self-pay | Admitting: *Deleted

## 2018-09-09 ENCOUNTER — Inpatient Hospital Stay: Payer: 59

## 2018-09-09 DIAGNOSIS — C3491 Malignant neoplasm of unspecified part of right bronchus or lung: Secondary | ICD-10-CM

## 2018-09-09 DIAGNOSIS — T63461D Toxic effect of venom of wasps, accidental (unintentional), subsequent encounter: Secondary | ICD-10-CM | POA: Diagnosis not present

## 2018-09-09 DIAGNOSIS — T63451D Toxic effect of venom of hornets, accidental (unintentional), subsequent encounter: Secondary | ICD-10-CM | POA: Diagnosis not present

## 2018-09-09 MED ORDER — MAGIC MOUTHWASH W/LIDOCAINE: 5.0000 mL | Freq: Three times a day (TID) | ORAL | 6 refills | Status: DC | PRN

## 2018-09-09 MED ORDER — LIDOCAINE-PRILOCAINE 2.5-2.5 % EX CREA: TOPICAL_CREAM | CUTANEOUS | 3 refills | Status: DC

## 2018-09-09 MED ORDER — ONDANSETRON HCL 8 MG PO TABS: 8.0000 mg | ORAL_TABLET | Freq: Two times a day (BID) | ORAL | 1 refills | Status: DC | PRN

## 2018-09-09 MED ORDER — PROCHLORPERAZINE MALEATE 10 MG PO TABS: 10.0000 mg | ORAL_TABLET | Freq: Four times a day (QID) | ORAL | 1 refills | Status: DC | PRN

## 2018-09-09 MED ORDER — LORAZEPAM 0.5 MG PO TABS: 0.5000 mg | ORAL_TABLET | Freq: Four times a day (QID) | ORAL | 0 refills | Status: DC | PRN

## 2018-09-09 MED FILL — LIDOCAINE-PRILOCAINE CREAM: 2.5-2.5 | 14 days supply | Qty: 30 | Fill #0

## 2018-09-09 MED FILL — LORazepam 0.5 MG TABS: 0.5 | 8 days supply | Qty: 30 | Fill #0

## 2018-09-09 MED FILL — PROCHLORPERAZINE 10 MG TAB: 10 | 7 days supply | Qty: 30 | Fill #0

## 2018-09-09 MED FILL — ONDANSETRON HCL 8 MG TABLET: 8 | 15 days supply | Qty: 30 | Fill #0

## 2018-09-09 NOTE — Telephone Encounter (Signed)
Appointments scheduled (per 2/11 staff message-overbook Dr Marin Olp) patient to get updated schedule at next visit per 2/7 los

## 2018-09-10 ENCOUNTER — Encounter: Payer: Self-pay | Admitting: *Deleted

## 2018-09-10 ENCOUNTER — Inpatient Hospital Stay: Payer: 59

## 2018-09-10 VITALS — BP 167/65 | HR 87 | Temp 98.2°F | Resp 16

## 2018-09-10 DIAGNOSIS — Z5111 Encounter for antineoplastic chemotherapy: Secondary | ICD-10-CM | POA: Diagnosis not present

## 2018-09-10 DIAGNOSIS — Z5189 Encounter for other specified aftercare: Secondary | ICD-10-CM | POA: Diagnosis not present

## 2018-09-10 DIAGNOSIS — C3491 Malignant neoplasm of unspecified part of right bronchus or lung: Secondary | ICD-10-CM | POA: Diagnosis not present

## 2018-09-10 DIAGNOSIS — C7951 Secondary malignant neoplasm of bone: Secondary | ICD-10-CM | POA: Diagnosis not present

## 2018-09-10 DIAGNOSIS — L03115 Cellulitis of right lower limb: Secondary | ICD-10-CM | POA: Diagnosis not present

## 2018-09-10 DIAGNOSIS — L03114 Cellulitis of left upper limb: Secondary | ICD-10-CM | POA: Diagnosis not present

## 2018-09-10 DIAGNOSIS — C787 Secondary malignant neoplasm of liver and intrahepatic bile duct: Secondary | ICD-10-CM | POA: Diagnosis not present

## 2018-09-10 DIAGNOSIS — D696 Thrombocytopenia, unspecified: Secondary | ICD-10-CM | POA: Diagnosis not present

## 2018-09-10 DIAGNOSIS — Z5112 Encounter for antineoplastic immunotherapy: Secondary | ICD-10-CM | POA: Diagnosis not present

## 2018-09-10 MED ORDER — SODIUM CHLORIDE 0.9% FLUSH
10.0000 mL | INTRAVENOUS | Status: DC | PRN
  Administered 2018-09-10: 10 mL
  Filled 2018-09-10: qty 10

## 2018-09-10 MED ORDER — SODIUM CHLORIDE 0.9 % IV SOLN
Freq: Once | INTRAVENOUS | Status: AC
  Administered 2018-09-10: 09:00:00 via INTRAVENOUS
  Filled 2018-09-10: qty 5

## 2018-09-10 MED ORDER — SODIUM CHLORIDE 0.9 % IV SOLN
Freq: Once | INTRAVENOUS | Status: AC
  Administered 2018-09-10: 08:00:00 via INTRAVENOUS
  Filled 2018-09-10: qty 250

## 2018-09-10 MED ORDER — SODIUM CHLORIDE 0.9 % IV SOLN
1200.0000 mg | Freq: Once | INTRAVENOUS | Status: AC
  Administered 2018-09-10: 1200 mg via INTRAVENOUS
  Filled 2018-09-10: qty 20

## 2018-09-10 MED ORDER — SODIUM CHLORIDE 0.9 % IV SOLN
387.0000 mg | Freq: Once | INTRAVENOUS | Status: AC
  Administered 2018-09-10: 390 mg via INTRAVENOUS
  Filled 2018-09-10: qty 39

## 2018-09-10 MED ORDER — PALONOSETRON HCL INJECTION 0.25 MG/5ML
0.2500 mg | Freq: Once | INTRAVENOUS | Status: AC
  Administered 2018-09-10: 0.25 mg via INTRAVENOUS

## 2018-09-10 MED ORDER — PALONOSETRON HCL INJECTION 0.25 MG/5ML
INTRAVENOUS | Status: AC
  Filled 2018-09-10: qty 5

## 2018-09-10 MED ORDER — HEPARIN SOD (PORK) LOCK FLUSH 100 UNIT/ML IV SOLN
500.0000 [IU] | Freq: Once | INTRAVENOUS | Status: AC | PRN
  Administered 2018-09-10: 500 [IU]
  Filled 2018-09-10: qty 5

## 2018-09-10 MED ORDER — SODIUM CHLORIDE 0.9 % IV SOLN
60.0000 mg/m2 | Freq: Once | INTRAVENOUS | Status: AC
  Administered 2018-09-10: 120 mg via INTRAVENOUS
  Filled 2018-09-10: qty 6

## 2018-09-10 MED FILL — MAGIC MTHWASH W/LIDOCAINE: 16 days supply | Qty: 240 | Fill #0

## 2018-09-10 NOTE — Addendum Note (Signed)
Addended by: Margaret Pyle on: 09/10/2018 02:46 PM   Modules accepted: Orders

## 2018-09-10 NOTE — Patient Instructions (Signed)
Carboplatin injection What is this medicine? CARBOPLATIN (KAR boe pla tin) is a chemotherapy drug. It targets fast dividing cells, like cancer cells, and causes these cells to die. This medicine is used to treat ovarian cancer and many other cancers. This medicine may be used for other purposes; ask your health care provider or pharmacist if you have questions. COMMON BRAND NAME(S): Paraplatin What should I tell my health care provider before I take this medicine? They need to know if you have any of these conditions: -blood disorders -hearing problems -kidney disease -recent or ongoing radiation therapy -an unusual or allergic reaction to carboplatin, cisplatin, other chemotherapy, other medicines, foods, dyes, or preservatives -pregnant or trying to get pregnant -breast-feeding How should I use this medicine? This drug is usually given as an infusion into a vein. It is administered in a hospital or clinic by a specially trained health care professional. Talk to your pediatrician regarding the use of this medicine in children. Special care may be needed. Overdosage: If you think you have taken too much of this medicine contact a poison control center or emergency room at once. NOTE: This medicine is only for you. Do not share this medicine with others. What if I miss a dose? It is important not to miss a dose. Call your doctor or health care professional if you are unable to keep an appointment. What may interact with this medicine? -medicines for seizures -medicines to increase blood counts like filgrastim, pegfilgrastim, sargramostim -some antibiotics like amikacin, gentamicin, neomycin, streptomycin, tobramycin -vaccines Talk to your doctor or health care professional before taking any of these medicines: -acetaminophen -aspirin -ibuprofen -ketoprofen -naproxen This list may not describe all possible interactions. Give your health care provider a list of all the medicines, herbs,  non-prescription drugs, or dietary supplements you use. Also tell them if you smoke, drink alcohol, or use illegal drugs. Some items may interact with your medicine. What should I watch for while using this medicine? Your condition will be monitored carefully while you are receiving this medicine. You will need important blood work done while you are taking this medicine. This drug may make you feel generally unwell. This is not uncommon, as chemotherapy can affect healthy cells as well as cancer cells. Report any side effects. Continue your course of treatment even though you feel ill unless your doctor tells you to stop. In some cases, you may be given additional medicines to help with side effects. Follow all directions for their use. Call your doctor or health care professional for advice if you get a fever, chills or sore throat, or other symptoms of a cold or flu. Do not treat yourself. This drug decreases your body's ability to fight infections. Try to avoid being around people who are sick. This medicine may increase your risk to bruise or bleed. Call your doctor or health care professional if you notice any unusual bleeding. Be careful brushing and flossing your teeth or using a toothpick because you may get an infection or bleed more easily. If you have any dental work done, tell your dentist you are receiving this medicine. Avoid taking products that contain aspirin, acetaminophen, ibuprofen, naproxen, or ketoprofen unless instructed by your doctor. These medicines may hide a fever. Do not become pregnant while taking this medicine. Women should inform their doctor if they wish to become pregnant or think they might be pregnant. There is a potential for serious side effects to an unborn child. Talk to your health care professional or  pharmacist for more information. Do not breast-feed an infant while taking this medicine. What side effects may I notice from receiving this medicine? Side effects  that you should report to your doctor or health care professional as soon as possible: -allergic reactions like skin rash, itching or hives, swelling of the face, lips, or tongue -signs of infection - fever or chills, cough, sore throat, pain or difficulty passing urine -signs of decreased platelets or bleeding - bruising, pinpoint red spots on the skin, black, tarry stools, nosebleeds -signs of decreased red blood cells - unusually weak or tired, fainting spells, lightheadedness -breathing problems -changes in hearing -changes in vision -chest pain -high blood pressure -low blood counts - This drug may decrease the number of white blood cells, red blood cells and platelets. You may be at increased risk for infections and bleeding. -nausea and vomiting -pain, swelling, redness or irritation at the injection site -pain, tingling, numbness in the hands or feet -problems with balance, talking, walking -trouble passing urine or change in the amount of urine Side effects that usually do not require medical attention (report to your doctor or health care professional if they continue or are bothersome): -hair loss -loss of appetite -metallic taste in the mouth or changes in taste This list may not describe all possible side effects. Call your doctor for medical advice about side effects. You may report side effects to FDA at 1-800-FDA-1088. Where should I keep my medicine? This drug is given in a hospital or clinic and will not be stored at home. NOTE: This sheet is a summary. It may not cover all possible information. If you have questions about this medicine, talk to your doctor, pharmacist, or health care provider.  2019 Elsevier/Gold Standard (2007-10-21 14:38:05) Etoposide, VP-16 injection What is this medicine? ETOPOSIDE, VP-16 (e toe POE side) is a chemotherapy drug. It is used to treat testicular cancer, lung cancer, and other cancers. This medicine may be used for other purposes; ask  your health care provider or pharmacist if you have questions. COMMON BRAND NAME(S): Etopophos, Toposar, VePesid What should I tell my health care provider before I take this medicine? They need to know if you have any of these conditions: -infection -kidney disease -liver disease -low blood counts, like low white cell, platelet, or red cell counts -an unusual or allergic reaction to etoposide, other medicines, foods, dyes, or preservatives -pregnant or trying to get pregnant -breast-feeding How should I use this medicine? This medicine is for infusion into a vein. It is administered in a hospital or clinic by a specially trained health care professional. Talk to your pediatrician regarding the use of this medicine in children. Special care may be needed. Overdosage: If you think you have taken too much of this medicine contact a poison control center or emergency room at once. NOTE: This medicine is only for you. Do not share this medicine with others. What if I miss a dose? It is important not to miss your dose. Call your doctor or health care professional if you are unable to keep an appointment. What may interact with this medicine? -aspirin -certain medications for seizures like carbamazepine, phenobarbital, phenytoin, valproic acid -cyclosporine -levamisole -warfarin This list may not describe all possible interactions. Give your health care provider a list of all the medicines, herbs, non-prescription drugs, or dietary supplements you use. Also tell them if you smoke, drink alcohol, or use illegal drugs. Some items may interact with your medicine. What should I watch for while  using this medicine? Visit your doctor for checks on your progress. This drug may make you feel generally unwell. This is not uncommon, as chemotherapy can affect healthy cells as well as cancer cells. Report any side effects. Continue your course of treatment even though you feel ill unless your doctor tells  you to stop. In some cases, you may be given additional medicines to help with side effects. Follow all directions for their use. Call your doctor or health care professional for advice if you get a fever, chills or sore throat, or other symptoms of a cold or flu. Do not treat yourself. This drug decreases your body's ability to fight infections. Try to avoid being around people who are sick. This medicine may increase your risk to bruise or bleed. Call your doctor or health care professional if you notice any unusual bleeding. Talk to your doctor about your risk of cancer. You may be more at risk for certain types of cancers if you take this medicine. Do not become pregnant while taking this medicine or for at least 6 months after stopping it. Women should inform their doctor if they wish to become pregnant or think they might be pregnant. Women of child-bearing potential will need to have a negative pregnancy test before starting this medicine. There is a potential for serious side effects to an unborn child. Talk to your health care professional or pharmacist for more information. Do not breast-feed an infant while taking this medicine. Men must use a latex condom during sexual contact with a woman while taking this medicine and for at least 4 months after stopping it. A latex condom is needed even if you have had a vasectomy. Contact your doctor right away if your partner becomes pregnant. Do not donate sperm while taking this medicine and for at least 4 months after you stop taking this medicine. Men should inform their doctors if they wish to father a child. This medicine may lower sperm counts. What side effects may I notice from receiving this medicine? Side effects that you should report to your doctor or health care professional as soon as possible: -allergic reactions like skin rash, itching or hives, swelling of the face, lips, or tongue -low blood counts - this medicine may decrease the number  of white blood cells, red blood cells and platelets. You may be at increased risk for infections and bleeding. -signs of infection - fever or chills, cough, sore throat, pain or difficulty passing urine -signs of decreased platelets or bleeding - bruising, pinpoint red spots on the skin, black, tarry stools, blood in the urine -signs of decreased red blood cells - unusually weak or tired, fainting spells, lightheadedness -breathing problems -changes in vision -mouth or throat sores or ulcers -pain, redness, swelling or irritation at the injection site -pain, tingling, numbness in the hands or feet -redness, blistering, peeling or loosening of the skin, including inside the mouth -seizures -vomiting Side effects that usually do not require medical attention (report to your doctor or health care professional if they continue or are bothersome): -diarrhea -hair loss -loss of appetite -nausea -stomach pain This list may not describe all possible side effects. Call your doctor for medical advice about side effects. You may report side effects to FDA at 1-800-FDA-1088. Where should I keep my medicine? This drug is given in a hospital or clinic and will not be stored at home. NOTE: This sheet is a summary. It may not cover all possible information. If you have  questions about this medicine, talk to your doctor, pharmacist, or health care provider.  2019 [employer]/Gold Standard (2015-07-08 11:53:23) Atezolizumab injection What is this medicine? ATEZOLIZUMAB (a te zoe LIZ ue mab) is a monoclonal antibody. It is used to treat bladder cancer (urothelial cancer), non-small cell lung cancer, small cell lung cancer, and breast cancer. This medicine may be used for other purposes; ask your health care provider or pharmacist if you have questions. COMMON BRAND NAME(S): Tecentriq What should I tell my health care provider before I take this medicine? They need to know if you have any of these  conditions: -diabetes -immune system problems -infection -inflammatory bowel disease -liver disease -lung or breathing disease -lupus -nervous system problems like myasthenia gravis or Guillain-Barre syndrome -organ transplant -an unusual or allergic reaction to atezolizumab, other medicines, foods, dyes, or preservatives -pregnant or trying to get pregnant -breast-feeding How should I use this medicine? This medicine is for infusion into a vein. It is given by a health care professional in a hospital or clinic setting. A special MedGuide will be given to you before each treatment. Be sure to read this information carefully each time. Talk to your pediatrician regarding the use of this medicine in children. Special care may be needed. Overdosage: If you think you have taken too much of this medicine contact a poison control center or emergency room at once. NOTE: This medicine is only for you. Do not share this medicine with others. What if I miss a dose? It is important not to miss your dose. Call your doctor or health care professional if you are unable to keep an appointment. What may interact with this medicine? Interactions have not been studied. This list may not describe all possible interactions. Give your health care provider a list of all the medicines, herbs, non-prescription drugs, or dietary supplements you use. Also tell them if you smoke, drink alcohol, or use illegal drugs. Some items may interact with your medicine. What should I watch for while using this medicine? Your condition will be monitored carefully while you are receiving this medicine. You may need blood work done while you are taking this medicine. Do not become pregnant while taking this medicine or for at least 5 months after stopping it. Women should inform their doctor if they wish to become pregnant or think they might be pregnant. There is a potential for serious side effects to an unborn child. Talk to  your health care professional or pharmacist for more information. Do not breast-feed an infant while taking this medicine or for at least 5 months after the last dose. What side effects may I notice from receiving this medicine? Side effects that you should report to your doctor or health care professional as soon as possible: -allergic reactions like skin rash, itching or hives, swelling of the face, lips, or tongue -black, tarry stools -bloody or watery diarrhea -breathing problems -changes in vision -chest pain or chest tightness -chills -facial flushing -fever -headache -signs and symptoms of high blood sugar such as dizziness; dry mouth; dry skin; fruity breath; nausea; stomach pain; increased hunger or thirst; increased urination -signs and symptoms of liver injury like dark yellow or brown urine; general ill feeling or flu-like symptoms; light-colored stools; loss of appetite; nausea; right upper belly pain; unusually weak or tired; yellowing of the eyes or skin -stomach pain -trouble passing urine or change in the amount of urine Side effects that usually do not require medical attention (report to your doctor or  health care professional if they continue or are bothersome): -cough -diarrhea -joint pain -muscle pain -muscle weakness -tiredness -weight loss This list may not describe all possible side effects. Call your doctor for medical advice about side effects. You may report side effects to FDA at 1-800-FDA-1088. Where should I keep my medicine? This drug is given in a hospital or clinic and will not be stored at home. NOTE: This sheet is a summary. It may not cover all possible information. If you have questions about this medicine, talk to your doctor, pharmacist, or health care provider.  2019 Elsevier/Gold Standard (2017-10-18 09:33:38)

## 2018-09-11 ENCOUNTER — Other Ambulatory Visit: Payer: Self-pay

## 2018-09-11 ENCOUNTER — Inpatient Hospital Stay: Payer: 59

## 2018-09-11 VITALS — BP 138/73 | HR 88 | Temp 98.0°F | Resp 16

## 2018-09-11 DIAGNOSIS — C787 Secondary malignant neoplasm of liver and intrahepatic bile duct: Secondary | ICD-10-CM | POA: Diagnosis not present

## 2018-09-11 DIAGNOSIS — L03114 Cellulitis of left upper limb: Secondary | ICD-10-CM | POA: Diagnosis not present

## 2018-09-11 DIAGNOSIS — D696 Thrombocytopenia, unspecified: Secondary | ICD-10-CM | POA: Diagnosis not present

## 2018-09-11 DIAGNOSIS — C3491 Malignant neoplasm of unspecified part of right bronchus or lung: Secondary | ICD-10-CM | POA: Diagnosis not present

## 2018-09-11 DIAGNOSIS — Z5111 Encounter for antineoplastic chemotherapy: Secondary | ICD-10-CM | POA: Diagnosis not present

## 2018-09-11 DIAGNOSIS — Z5189 Encounter for other specified aftercare: Secondary | ICD-10-CM | POA: Diagnosis not present

## 2018-09-11 DIAGNOSIS — C7951 Secondary malignant neoplasm of bone: Secondary | ICD-10-CM | POA: Diagnosis not present

## 2018-09-11 DIAGNOSIS — L03115 Cellulitis of right lower limb: Secondary | ICD-10-CM | POA: Diagnosis not present

## 2018-09-11 DIAGNOSIS — Z5112 Encounter for antineoplastic immunotherapy: Secondary | ICD-10-CM | POA: Diagnosis not present

## 2018-09-11 MED ORDER — HYDROCODONE-HOMATROPINE 5-1.5 MG/5ML PO SYRP: 5.0000 mL | ORAL_SOLUTION | ORAL | 0 refills | Status: DC | PRN

## 2018-09-11 MED ORDER — DEXAMETHASONE SODIUM PHOSPHATE 10 MG/ML IJ SOLN
INTRAMUSCULAR | Status: AC
  Filled 2018-09-11: qty 1

## 2018-09-11 MED ORDER — SODIUM CHLORIDE 0.9 % IV SOLN
Freq: Once | INTRAVENOUS | Status: AC
  Administered 2018-09-11: 08:00:00 via INTRAVENOUS
  Filled 2018-09-11: qty 250

## 2018-09-11 MED ORDER — DEXAMETHASONE SODIUM PHOSPHATE 10 MG/ML IJ SOLN
10.0000 mg | Freq: Once | INTRAMUSCULAR | Status: AC
  Administered 2018-09-11: 10 mg via INTRAVENOUS

## 2018-09-11 MED ORDER — SODIUM CHLORIDE 0.9 % IV SOLN
60.0000 mg/m2 | Freq: Once | INTRAVENOUS | Status: AC
  Administered 2018-09-11: 120 mg via INTRAVENOUS
  Filled 2018-09-11: qty 6

## 2018-09-11 MED ORDER — SODIUM CHLORIDE 0.9% FLUSH
10.0000 mL | INTRAVENOUS | Status: DC | PRN
  Administered 2018-09-11: 10 mL
  Filled 2018-09-11: qty 10

## 2018-09-11 MED ORDER — SODIUM CHLORIDE 0.9 % IV SOLN
10.0000 mg | Freq: Once | INTRAVENOUS | Status: DC
  Filled 2018-09-11: qty 1

## 2018-09-11 MED ORDER — HEPARIN SOD (PORK) LOCK FLUSH 100 UNIT/ML IV SOLN
500.0000 [IU] | Freq: Once | INTRAVENOUS | Status: AC | PRN
  Administered 2018-09-11: 500 [IU]
  Filled 2018-09-11: qty 5

## 2018-09-11 MED FILL — HYDROCODONE-HOMATROPINE SYR: 5-1.5 | 15 days supply | Qty: 250 | Fill #0

## 2018-09-11 NOTE — Patient Instructions (Addendum)
Etoposide, VP-16 injection What is this medicine? ETOPOSIDE, VP-16 (e toe POE side) is a chemotherapy drug. It is used to treat testicular cancer, lung cancer, and other cancers. This medicine may be used for other purposes; ask your health care provider or pharmacist if you have questions. COMMON BRAND NAME(S): Etopophos, Toposar, VePesid What should I tell my health care provider before I take this medicine? They need to know if you have any of these conditions: -infection -kidney disease -liver disease -low blood counts, like low white cell, platelet, or red cell counts -an unusual or allergic reaction to etoposide, other medicines, foods, dyes, or preservatives -pregnant or trying to get pregnant -breast-feeding How should I use this medicine? This medicine is for infusion into a vein. It is administered in a hospital or clinic by a specially trained health care professional. Talk to your pediatrician regarding the use of this medicine in children. Special care may be needed. Overdosage: If you think you have taken too much of this medicine contact a poison control center or emergency room at once. NOTE: This medicine is only for you. Do not share this medicine with others. What if I miss a dose? It is important not to miss your dose. Call your doctor or health care professional if you are unable to keep an appointment. What may interact with this medicine? -aspirin -certain medications for seizures like carbamazepine, phenobarbital, phenytoin, valproic acid -cyclosporine -levamisole -warfarin This list may not describe all possible interactions. Give your health care provider a list of all the medicines, herbs, non-prescription drugs, or dietary supplements you use. Also tell them if you smoke, drink alcohol, or use illegal drugs. Some items may interact with your medicine. What should I watch for while using this medicine? Visit your doctor for checks on your progress. This drug  may make you feel generally unwell. This is not uncommon, as chemotherapy can affect healthy cells as well as cancer cells. Report any side effects. Continue your course of treatment even though you feel ill unless your doctor tells you to stop. In some cases, you may be given additional medicines to help with side effects. Follow all directions for their use. Call your doctor or health care professional for advice if you get a fever, chills or sore throat, or other symptoms of a cold or flu. Do not treat yourself. This drug decreases your body's ability to fight infections. Try to avoid being around people who are sick. This medicine may increase your risk to bruise or bleed. Call your doctor or health care professional if you notice any unusual bleeding. Talk to your doctor about your risk of cancer. You may be more at risk for certain types of cancers if you take this medicine. Do not become pregnant while taking this medicine or for at least 6 months after stopping it. Women should inform their doctor if they wish to become pregnant or think they might be pregnant. Women of child-bearing potential will need to have a negative pregnancy test before starting this medicine. There is a potential for serious side effects to an unborn child. Talk to your health care professional or pharmacist for more information. Do not breast-feed an infant while taking this medicine. Men must use a latex condom during sexual contact with a woman while taking this medicine and for at least 4 months after stopping it. A latex condom is needed even if you have had a vasectomy. Contact your doctor right away if your partner becomes pregnant. Do   not donate sperm while taking this medicine and for at least 4 months after you stop taking this medicine. Men should inform their doctors if they wish to father a child. This medicine may lower sperm counts. What side effects may I notice from receiving this medicine? Side effects that  you should report to your doctor or health care professional as soon as possible: -allergic reactions like skin rash, itching or hives, swelling of the face, lips, or tongue -low blood counts - this medicine may decrease the number of white blood cells, red blood cells and platelets. You may be at increased risk for infections and bleeding. -signs of infection - fever or chills, cough, sore throat, pain or difficulty passing urine -signs of decreased platelets or bleeding - bruising, pinpoint red spots on the skin, black, tarry stools, blood in the urine -signs of decreased red blood cells - unusually weak or tired, fainting spells, lightheadedness -breathing problems -changes in vision -mouth or throat sores or ulcers -pain, redness, swelling or irritation at the injection site -pain, tingling, numbness in the hands or feet -redness, blistering, peeling or loosening of the skin, including inside the mouth -seizures -vomiting Side effects that usually do not require medical attention (report to your doctor or health care professional if they continue or are bothersome): -diarrhea -hair loss -loss of appetite -nausea -stomach pain This list may not describe all possible side effects. Call your doctor for medical advice about side effects. You may report side effects to FDA at 1-800-FDA-1088. Where should I keep my medicine? This drug is given in a hospital or clinic and will not be stored at home. NOTE: This sheet is a summary. It may not cover all possible information. If you have questions about this medicine, talk to your doctor, pharmacist, or health care provider.  2019 [employer] Standard (2015-07-08 11:53:23)  

## 2018-09-12 ENCOUNTER — Other Ambulatory Visit: Payer: Self-pay

## 2018-09-12 ENCOUNTER — Inpatient Hospital Stay: Payer: 59

## 2018-09-12 VITALS — BP 141/82 | HR 80 | Temp 98.0°F | Resp 16

## 2018-09-12 DIAGNOSIS — C3491 Malignant neoplasm of unspecified part of right bronchus or lung: Secondary | ICD-10-CM | POA: Diagnosis not present

## 2018-09-12 DIAGNOSIS — L03115 Cellulitis of right lower limb: Secondary | ICD-10-CM | POA: Diagnosis not present

## 2018-09-12 DIAGNOSIS — C7951 Secondary malignant neoplasm of bone: Secondary | ICD-10-CM | POA: Diagnosis not present

## 2018-09-12 DIAGNOSIS — C787 Secondary malignant neoplasm of liver and intrahepatic bile duct: Secondary | ICD-10-CM | POA: Diagnosis not present

## 2018-09-12 DIAGNOSIS — Z5111 Encounter for antineoplastic chemotherapy: Secondary | ICD-10-CM | POA: Diagnosis not present

## 2018-09-12 DIAGNOSIS — Z5189 Encounter for other specified aftercare: Secondary | ICD-10-CM | POA: Diagnosis not present

## 2018-09-12 DIAGNOSIS — D696 Thrombocytopenia, unspecified: Secondary | ICD-10-CM | POA: Diagnosis not present

## 2018-09-12 DIAGNOSIS — Z5112 Encounter for antineoplastic immunotherapy: Secondary | ICD-10-CM | POA: Diagnosis not present

## 2018-09-12 DIAGNOSIS — L03114 Cellulitis of left upper limb: Secondary | ICD-10-CM | POA: Diagnosis not present

## 2018-09-12 MED ORDER — PEGFILGRASTIM 6 MG/0.6ML ~~LOC~~ PSKT
PREFILLED_SYRINGE | SUBCUTANEOUS | Status: AC
  Filled 2018-09-12: qty 0.6

## 2018-09-12 MED ORDER — HEPARIN SOD (PORK) LOCK FLUSH 100 UNIT/ML IV SOLN
500.0000 [IU] | Freq: Once | INTRAVENOUS | Status: AC | PRN
  Administered 2018-09-12: 500 [IU]
  Filled 2018-09-12: qty 5

## 2018-09-12 MED ORDER — SODIUM CHLORIDE 0.9 % IV SOLN
60.0000 mg/m2 | Freq: Once | INTRAVENOUS | Status: AC
  Administered 2018-09-12: 120 mg via INTRAVENOUS
  Filled 2018-09-12: qty 6

## 2018-09-12 MED ORDER — DEXAMETHASONE SODIUM PHOSPHATE 10 MG/ML IJ SOLN
INTRAMUSCULAR | Status: AC
  Filled 2018-09-12: qty 1

## 2018-09-12 MED ORDER — DEXAMETHASONE SODIUM PHOSPHATE 10 MG/ML IJ SOLN
10.0000 mg | Freq: Once | INTRAMUSCULAR | Status: AC
  Administered 2018-09-12: 10 mg via INTRAVENOUS

## 2018-09-12 MED ORDER — SODIUM CHLORIDE 0.9 % IV SOLN
Freq: Once | INTRAVENOUS | Status: AC
  Administered 2018-09-12: 08:00:00 via INTRAVENOUS
  Filled 2018-09-12: qty 250

## 2018-09-12 MED ORDER — PEGFILGRASTIM 6 MG/0.6ML ~~LOC~~ PSKT
6.0000 mg | PREFILLED_SYRINGE | Freq: Once | SUBCUTANEOUS | Status: AC
  Administered 2018-09-12: 6 mg via SUBCUTANEOUS

## 2018-09-12 MED ORDER — SODIUM CHLORIDE 0.9% FLUSH
10.0000 mL | INTRAVENOUS | Status: DC | PRN
  Administered 2018-09-12: 10 mL
  Filled 2018-09-12: qty 10

## 2018-09-12 NOTE — Patient Instructions (Addendum)
Pegfilgrastim injection  What is this medicine?  PEGFILGRASTIM (PEG fil gra stim) is a long-acting granulocyte colony-stimulating factor that stimulates the growth of neutrophils, a type of white blood cell important in the body's fight against infection. It is used to reduce the incidence of fever and infection in patients with certain types of cancer who are receiving chemotherapy that affects the bone marrow, and to increase survival after being exposed to high doses of radiation.  This medicine may be used for other purposes; ask your health care provider or pharmacist if you have questions.  COMMON BRAND NAME(S): Fulphila, Neulasta, UDENYCA  What should I tell my health care provider before I take this medicine?  They need to know if you have any of these conditions:  -kidney disease  -latex allergy  -ongoing radiation therapy  -sickle cell disease  -skin reactions to acrylic adhesives (On-Body Injector only)  -an unusual or allergic reaction to pegfilgrastim, filgrastim, other medicines, foods, dyes, or preservatives  -pregnant or trying to get pregnant  -breast-feeding  How should I use this medicine?  This medicine is for injection under the skin. If you get this medicine at home, you will be taught how to prepare and give the pre-filled syringe or how to use the On-body Injector. Refer to the patient Instructions for Use for detailed instructions. Use exactly as directed. Tell your healthcare provider immediately if you suspect that the On-body Injector may not have performed as intended or if you suspect the use of the On-body Injector resulted in a missed or partial dose.  It is important that you put your used needles and syringes in a special sharps container. Do not put them in a trash can. If you do not have a sharps container, call your pharmacist or healthcare provider to get one.  Talk to your pediatrician regarding the use of this medicine in children. While this drug may be prescribed for  selected conditions, precautions do apply.  Overdosage: If you think you have taken too much of this medicine contact a poison control center or emergency room at once.  NOTE: This medicine is only for you. Do not share this medicine with others.  What if I miss a dose?  It is important not to miss your dose. Call your doctor or health care professional if you miss your dose. If you miss a dose due to an On-body Injector failure or leakage, a new dose should be administered as soon as possible using a single prefilled syringe for manual use.  What may interact with this medicine?  Interactions have not been studied.  Give your health care provider a list of all the medicines, herbs, non-prescription drugs, or dietary supplements you use. Also tell them if you smoke, drink alcohol, or use illegal drugs. Some items may interact with your medicine.  This list may not describe all possible interactions. Give your health care provider a list of all the medicines, herbs, non-prescription drugs, or dietary supplements you use. Also tell them if you smoke, drink alcohol, or use illegal drugs. Some items may interact with your medicine.  What should I watch for while using this medicine?  You may need blood work done while you are taking this medicine.  If you are going to need a MRI, CT scan, or other procedure, tell your doctor that you are using this medicine (On-Body Injector only).  What side effects may I notice from receiving this medicine?  Side effects that you should report to   your doctor or health care professional as soon as possible:  -allergic reactions like skin rash, itching or hives, swelling of the face, lips, or tongue  -back pain  -dizziness  -fever  -pain, redness, or irritation at site where injected  -pinpoint red spots on the skin  -red or dark-brown urine  -shortness of breath or breathing problems  -stomach or side pain, or pain at the shoulder  -swelling  -tiredness  -trouble passing urine or  change in the amount of urine  Side effects that usually do not require medical attention (report to your doctor or health care professional if they continue or are bothersome):  -bone pain  -muscle pain  This list may not describe all possible side effects. Call your doctor for medical advice about side effects. You may report side effects to FDA at 1-800-FDA-1088.  Where should I keep my medicine?  Keep out of the reach of children.  If you are using this medicine at home, you will be instructed on how to store it. Throw away any unused medicine after the expiration date on the label.  NOTE: This sheet is a summary. It may not cover all possible information. If you have questions about this medicine, talk to your doctor, pharmacist, or health care provider.   2019 Elsevier/Gold Standard (2017-10-21 16:57:08)  Etoposide, VP-16 injection  What is this medicine?  ETOPOSIDE, VP-16 (e toe POE side) is a chemotherapy drug. It is used to treat testicular cancer, lung cancer, and other cancers.  This medicine may be used for other purposes; ask your health care provider or pharmacist if you have questions.  COMMON BRAND NAME(S): Etopophos, Toposar, VePesid  What should I tell my health care provider before I take this medicine?  They need to know if you have any of these conditions:  -infection  -kidney disease  -liver disease  -low blood counts, like low white cell, platelet, or red cell counts  -an unusual or allergic reaction to etoposide, other medicines, foods, dyes, or preservatives  -pregnant or trying to get pregnant  -breast-feeding  How should I use this medicine?  This medicine is for infusion into a vein. It is administered in a hospital or clinic by a specially trained health care professional.  Talk to your pediatrician regarding the use of this medicine in children. Special care may be needed.  Overdosage: If you think you have taken too much of this medicine contact a poison control center or emergency  room at once.  NOTE: This medicine is only for you. Do not share this medicine with others.  What if I miss a dose?  It is important not to miss your dose. Call your doctor or health care professional if you are unable to keep an appointment.  What may interact with this medicine?  -aspirin  -certain medications for seizures like carbamazepine, phenobarbital, phenytoin, valproic acid  -cyclosporine  -levamisole  -warfarin  This list may not describe all possible interactions. Give your health care provider a list of all the medicines, herbs, non-prescription drugs, or dietary supplements you use. Also tell them if you smoke, drink alcohol, or use illegal drugs. Some items may interact with your medicine.  What should I watch for while using this medicine?  Visit your doctor for checks on your progress. This drug may make you feel generally unwell. This is not uncommon, as chemotherapy can affect healthy cells as well as cancer cells. Report any side effects. Continue your course of treatment even   though you feel ill unless your doctor tells you to stop.  In some cases, you may be given additional medicines to help with side effects. Follow all directions for their use.  Call your doctor or health care professional for advice if you get a fever, chills or sore throat, or other symptoms of a cold or flu. Do not treat yourself. This drug decreases your body's ability to fight infections. Try to avoid being around people who are sick.  This medicine may increase your risk to bruise or bleed. Call your doctor or health care professional if you notice any unusual bleeding.  Talk to your doctor about your risk of cancer. You may be more at risk for certain types of cancers if you take this medicine.  Do not become pregnant while taking this medicine or for at least 6 months after stopping it. Women should inform their doctor if they wish to become pregnant or think they might be pregnant. Women of child-bearing potential  will need to have a negative pregnancy test before starting this medicine. There is a potential for serious side effects to an unborn child. Talk to your health care professional or pharmacist for more information. Do not breast-feed an infant while taking this medicine.  Men must use a latex condom during sexual contact with a woman while taking this medicine and for at least 4 months after stopping it. A latex condom is needed even if you have had a vasectomy. Contact your doctor right away if your partner becomes pregnant. Do not donate sperm while taking this medicine and for at least 4 months after you stop taking this medicine. Men should inform their doctors if they wish to father a child. This medicine may lower sperm counts.  What side effects may I notice from receiving this medicine?  Side effects that you should report to your doctor or health care professional as soon as possible:  -allergic reactions like skin rash, itching or hives, swelling of the face, lips, or tongue  -low blood counts - this medicine may decrease the number of white blood cells, red blood cells and platelets. You may be at increased risk for infections and bleeding.  -signs of infection - fever or chills, cough, sore throat, pain or difficulty passing urine  -signs of decreased platelets or bleeding - bruising, pinpoint red spots on the skin, black, tarry stools, blood in the urine  -signs of decreased red blood cells - unusually weak or tired, fainting spells, lightheadedness  -breathing problems  -changes in vision  -mouth or throat sores or ulcers  -pain, redness, swelling or irritation at the injection site  -pain, tingling, numbness in the hands or feet  -redness, blistering, peeling or loosening of the skin, including inside the mouth  -seizures  -vomiting  Side effects that usually do not require medical attention (report to your doctor or health care professional if they continue or are bothersome):  -diarrhea  -hair  loss  -loss of appetite  -nausea  -stomach pain  This list may not describe all possible side effects. Call your doctor for medical advice about side effects. You may report side effects to FDA at 1-800-FDA-1088.  Where should I keep my medicine?  This drug is given in a hospital or clinic and will not be stored at home.  NOTE: This sheet is a summary. It may not cover all possible information. If you have questions about this medicine, talk to your doctor, pharmacist, or health care provider.     2019 Elsevier/Gold Standard (2015-07-08 11:53:23)

## 2018-09-16 ENCOUNTER — Telehealth: Payer: Self-pay | Admitting: *Deleted

## 2018-09-16 NOTE — Telephone Encounter (Signed)
Chemotherapy f/u call complete.  Pt with no complaints other than dry mouth and slight swelling to legs.  Pt states no difficulties with nausea or vomiting, constipation or diarrhea, or trouble sleeping.  Pt appreciative of call and has no other concerns at this time.

## 2018-09-17 ENCOUNTER — Other Ambulatory Visit: Payer: Self-pay | Admitting: Hematology & Oncology

## 2018-09-17 ENCOUNTER — Telehealth: Payer: Self-pay | Admitting: *Deleted

## 2018-09-17 ENCOUNTER — Encounter: Payer: Self-pay | Admitting: Family

## 2018-09-17 ENCOUNTER — Inpatient Hospital Stay: Payer: 59

## 2018-09-17 ENCOUNTER — Encounter (HOSPITAL_COMMUNITY): Payer: Self-pay | Admitting: Hematology & Oncology

## 2018-09-17 ENCOUNTER — Encounter: Payer: Self-pay | Admitting: Hematology & Oncology

## 2018-09-17 ENCOUNTER — Ambulatory Visit (HOSPITAL_BASED_OUTPATIENT_CLINIC_OR_DEPARTMENT_OTHER)
Admission: RE | Admit: 2018-09-17 | Discharge: 2018-09-17 | Disposition: A | Discharge status: Home / Self Care | Payer: 59 | Source: Ambulatory Visit | Attending: Family | Admitting: Family

## 2018-09-17 ENCOUNTER — Other Ambulatory Visit: Payer: Self-pay | Admitting: *Deleted

## 2018-09-17 ENCOUNTER — Inpatient Hospital Stay (HOSPITAL_BASED_OUTPATIENT_CLINIC_OR_DEPARTMENT_OTHER): Payer: 59 | Admitting: Family

## 2018-09-17 ENCOUNTER — Other Ambulatory Visit: Payer: Self-pay

## 2018-09-17 ENCOUNTER — Encounter: Payer: Self-pay | Admitting: *Deleted

## 2018-09-17 VITALS — BP 144/77 | HR 103 | Temp 98.7°F

## 2018-09-17 VITALS — BP 161/94 | HR 90 | Temp 98.1°F | Resp 18

## 2018-09-17 DIAGNOSIS — Z5112 Encounter for antineoplastic immunotherapy: Secondary | ICD-10-CM | POA: Diagnosis not present

## 2018-09-17 DIAGNOSIS — L02519 Cutaneous abscess of unspecified hand: Secondary | ICD-10-CM

## 2018-09-17 DIAGNOSIS — Z8546 Personal history of malignant neoplasm of prostate: Secondary | ICD-10-CM | POA: Diagnosis not present

## 2018-09-17 DIAGNOSIS — R600 Localized edema: Secondary | ICD-10-CM

## 2018-09-17 DIAGNOSIS — Z87828 Personal history of other (healed) physical injury and trauma: Secondary | ICD-10-CM

## 2018-09-17 DIAGNOSIS — M7989 Other specified soft tissue disorders: Secondary | ICD-10-CM

## 2018-09-17 DIAGNOSIS — C7951 Secondary malignant neoplasm of bone: Secondary | ICD-10-CM | POA: Diagnosis not present

## 2018-09-17 DIAGNOSIS — L03119 Cellulitis of unspecified part of limb: Secondary | ICD-10-CM

## 2018-09-17 DIAGNOSIS — L03114 Cellulitis of left upper limb: Secondary | ICD-10-CM | POA: Diagnosis not present

## 2018-09-17 DIAGNOSIS — C3491 Malignant neoplasm of unspecified part of right bronchus or lung: Secondary | ICD-10-CM

## 2018-09-17 DIAGNOSIS — L03115 Cellulitis of right lower limb: Secondary | ICD-10-CM

## 2018-09-17 DIAGNOSIS — Z5189 Encounter for other specified aftercare: Secondary | ICD-10-CM | POA: Diagnosis not present

## 2018-09-17 DIAGNOSIS — C787 Secondary malignant neoplasm of liver and intrahepatic bile duct: Secondary | ICD-10-CM

## 2018-09-17 DIAGNOSIS — D696 Thrombocytopenia, unspecified: Secondary | ICD-10-CM

## 2018-09-17 DIAGNOSIS — Z85528 Personal history of other malignant neoplasm of kidney: Secondary | ICD-10-CM

## 2018-09-17 DIAGNOSIS — R32 Unspecified urinary incontinence: Secondary | ICD-10-CM

## 2018-09-17 DIAGNOSIS — L0390 Cellulitis, unspecified: Secondary | ICD-10-CM | POA: Insufficient documentation

## 2018-09-17 DIAGNOSIS — Z5111 Encounter for antineoplastic chemotherapy: Secondary | ICD-10-CM | POA: Diagnosis not present

## 2018-09-17 LAB — ABO/RH: ABO/RH(D): A POS

## 2018-09-17 LAB — CBC WITH DIFFERENTIAL (CANCER CENTER ONLY)
Abs Immature Granulocytes: 0.11 10*3/uL — ABNORMAL HIGH (ref 0.00–0.07)
Basophils Absolute: 0 10*3/uL (ref 0.0–0.1)
Basophils Relative: 1 %
Eosinophils Absolute: 0.1 10*3/uL (ref 0.0–0.5)
Eosinophils Relative: 3 %
HCT: 28.4 % — ABNORMAL LOW (ref 39.0–52.0)
Hemoglobin: 9.1 g/dL — ABNORMAL LOW (ref 13.0–17.0)
Immature Granulocytes: 5 %
Lymphocytes Relative: 17 %
Lymphs Abs: 0.4 10*3/uL — ABNORMAL LOW (ref 0.7–4.0)
MCH: 30 pg (ref 26.0–34.0)
MCHC: 32 g/dL (ref 30.0–36.0)
MCV: 93.7 fL (ref 80.0–100.0)
Monocytes Absolute: 0.2 10*3/uL (ref 0.1–1.0)
Monocytes Relative: 7 %
Neutro Abs: 1.6 10*3/uL — ABNORMAL LOW (ref 1.7–7.7)
Neutrophils Relative %: 67 %
Platelet Count: 11 10*3/uL — ABNORMAL LOW (ref 150–400)
RBC: 3.03 MIL/uL — ABNORMAL LOW (ref 4.22–5.81)
RDW: 15 % (ref 11.5–15.5)
WBC Count: 2.3 10*3/uL — ABNORMAL LOW (ref 4.0–10.5)
nRBC: 0 % (ref 0.0–0.2)

## 2018-09-17 LAB — SAMPLE TO BLOOD BANK

## 2018-09-17 LAB — CMP (CANCER CENTER ONLY)
ALT: 62 U/L — ABNORMAL HIGH (ref 0–44)
AST: 34 U/L (ref 15–41)
Albumin: 3.3 g/dL — ABNORMAL LOW (ref 3.5–5.0)
Alkaline Phosphatase: 293 U/L — ABNORMAL HIGH (ref 38–126)
Anion gap: 7 (ref 5–15)
BUN: 33 mg/dL — ABNORMAL HIGH (ref 8–23)
CO2: 32 mmol/L (ref 22–32)
Calcium: 9.3 mg/dL (ref 8.9–10.3)
Chloride: 106 mmol/L (ref 98–111)
Creatinine: 1.28 mg/dL — ABNORMAL HIGH (ref 0.61–1.24)
GFR, Est AFR Am: >60 mL/min (ref >60)
GFR, Estimated: 58 mL/min — ABNORMAL LOW (ref >60)
Glucose, Bld: 107 mg/dL — ABNORMAL HIGH (ref 70–99)
Potassium: 4.2 mmol/L (ref 3.5–5.1)
Sodium: 145 mmol/L (ref 135–145)
Total Bilirubin: 0.6 mg/dL (ref 0.3–1.2)
Total Protein: 5.2 g/dL — ABNORMAL LOW (ref 6.5–8.1)

## 2018-09-17 MED ORDER — DIPHENHYDRAMINE HCL 25 MG PO CAPS
ORAL_CAPSULE | ORAL | Status: AC
  Filled 2018-09-17: qty 1

## 2018-09-17 MED ORDER — SODIUM CHLORIDE 0.9 % IV SOLN
6.0000 mg/kg | Freq: Every day | INTRAVENOUS | Status: DC
  Filled 2018-09-17: qty 10.45

## 2018-09-17 MED ORDER — SODIUM CHLORIDE 0.9 % IV SOLN
500.0000 mg | Freq: Every day | INTRAVENOUS | Status: AC
  Filled 2018-09-17: qty 10

## 2018-09-17 MED ORDER — ACETAMINOPHEN 325 MG PO TABS
ORAL_TABLET | ORAL | Status: AC
  Filled 2018-09-17: qty 2

## 2018-09-17 MED ORDER — SODIUM CHLORIDE 0.9 % IV SOLN
500.0000 mg | Freq: Every day | INTRAVENOUS | Status: DC
  Administered 2018-09-17: 500 mg via INTRAVENOUS
  Filled 2018-09-17: qty 10

## 2018-09-17 MED ORDER — DIPHENHYDRAMINE HCL 25 MG PO CAPS: 25.0000 mg | ORAL_CAPSULE | Freq: Once | ORAL | Status: DC

## 2018-09-17 MED ORDER — ACETAMINOPHEN 325 MG PO TABS: 650.0000 mg | ORAL_TABLET | Freq: Once | ORAL | Status: DC

## 2018-09-17 NOTE — Progress Notes (Signed)
Hematology and Oncology Follow Up Visit  Taylor Elliott [id number] 1951/05/01 68 y.o. 09/17/2018   Principle Diagnosis:  Extensive stage small cell lung cancer SIADH secondary to small cell lung cancer  Current Therapy:   Carboplatinum/etoposide/Tecentriq-cycle 1 to started on 09/10/2018 Demeclocycline 150 mg po BID   Interim History:  Taylor Elliott is here today for an unscheduled visit due to blisters and open wounds on the tops of both his hands that have worsened over the last week. These are weeping. He has redness and swelling in both hands that has started to travel past his wrists. This appears to be cellulitis.  He has been using aloe vera and bacitracin on his hands.  I was able swab the wound on his left hand and send out for culture.  No numbness or tingling in his extremities at this time.  He has 3+ pitting edema in both lower extremities. Pedal pulses are 2+. [place] today showed no evidence of DVT in either leg.  No fever, chills, n/v, cough, dizziness, SOB, chest pain, palpitations, abdominal pain or changes in bowel or bladder habits.  Platelet count is down to 11. He denies any episodes of bleeding, no bruising or petechiae.  He is still having urinary incontinence and has an appointment with his urologist Dr. Windle Guard on 09/25/2018.  He is eating well. He is still very thirsty and staying well hydrated.  He is using biotene 4 times a day. No oral sores.  His sodium has resolved on Declomycin.  No lymphadenopathy noted on exam.   ECOG Performance Status: 1 - Symptomatic but completely ambulatory  Medications:  Allergies as of 09/17/2018      Reactions   Bee Venom Anaphylaxis   Clindamycin/lincomycin Hives      Medication List       Accurate as of September 17, 2018 11:21 AM. Always use your most recent med list.        allopurinol 100 MG tablet Commonly known as:  ZYLOPRIM Take 2 tablets (200 mg total) by mouth every morning.   amLODipine 10 MG tablet Commonly  known as:  NORVASC TAKE 1 TABLET BY MOUTH EVERY MORNING   aspirin EC 81 MG tablet Take 81 mg by mouth daily.   calcium carbonate 500 MG chewable tablet Commonly known as:  TUMS - dosed in mg elemental calcium Chew 2 tablets by mouth daily as needed for indigestion or heartburn.   demeclocycline 300 MG tablet Commonly known as:  DECLOMYCIN Take 1 tablet (300 mg total) by mouth 2 (two) times daily.   diphenhydrAMINE 25 MG tablet Commonly known as:  SOMINEX Take 25 mg by mouth at bedtime as needed for sleep.   EPINEPHrine 0.3 mg/0.3 mL Soaj injection Commonly known as:  EPI-PEN Inject 0.3 mg into the muscle once.   fenofibrate 160 MG tablet Take 1 tablet (160 mg total) by mouth daily.   HYDROcodone-homatropine 5-1.5 MG/5ML syrup Commonly known as:  HYCODAN Take 5 mLs by mouth every 4 (four) hours as needed for cough.   lidocaine 2 % solution Commonly known as:  XYLOCAINE   lidocaine-prilocaine cream Commonly known as:  EMLA Apply to affected area once   lisinopril 20 MG tablet Commonly known as:  PRINIVIL,ZESTRIL TAKE 1 TABLET BY MOUTH ONCE DAILY   LORazepam 0.5 MG tablet Commonly known as:  ATIVAN Take 1 tablet (0.5 mg total) by mouth every 6 (six) hours as needed (Nausea or vomiting).   magic mouthwash w/lidocaine Soln Take 5 mLs by  mouth 3 (three) times daily as needed for mouth pain. benadryl 525 mg, hydrocortisone 60 mg and nystatin 0.6 mg.   multivitamin tablet Take 1 tablet by mouth daily.   ondansetron 8 MG tablet Commonly known as:  ZOFRAN Take 1 tablet (8 mg total) by mouth 2 (two) times daily as needed for refractory nausea / vomiting. Start on day 3 after carboplatin chemo.   oxyCODONE 5 MG immediate release tablet Commonly known as:  Oxy IR/ROXICODONE Take 1 tablet (5 mg total) by mouth every 6 (six) hours as needed for severe pain.   prochlorperazine 10 MG tablet Commonly known as:  COMPAZINE Take 1 tablet (10 mg total) by mouth every 6 (six)  hours as needed (Nausea or vomiting).   rosuvastatin 10 MG tablet Commonly known as:  CRESTOR TAKE 1 TABLET BY MOUTH ONCE DAILY   solifenacin 10 MG tablet Commonly known as:  VESICARE Take 10 mg by mouth every evening.   UNABLE TO FIND Med Name: Allergy shots.       Allergies:  Allergies  Allergen Reactions  . Bee Venom Anaphylaxis  . Clindamycin/Lincomycin Hives    Past Medical History, Surgical history, Social history, and Family History were reviewed and updated.  Review of Systems: All other 10 point review of systems is negative.   Physical Exam:  oral temperature is 98.7 F (37.1 C). His blood pressure is 144/77 (abnormal) and his pulse is 103 (abnormal). His oxygen saturation is 100%.   Wt Readings from Last 3 Encounters:  09/05/18 192 lb (87.1 kg)  08/21/18 196 lb 6.4 oz (89.1 kg)  07/22/18 201 lb 12.8 oz (91.5 kg)    Ocular: Sclerae unicteric, pupils equal, round and reactive to light Ear-nose-throat: Oropharynx clear, dentition fair Lymphatic: No cervical, supraclavicular or axillary adenopathy Lungs no rales or rhonchi, good excursion bilaterally Heart regular rate and rhythm, no murmur appreciated Abd soft, nontender, positive bowel sounds, no liver or spleen tip palpated on exam, no fluid wave  MSK no focal spinal tenderness, no joint edema Neuro: non-focal, well-oriented, appropriate affect Breasts: Deferred   Lab Results  Component Value Date   WBC 2.3 (L) 09/17/2018   HGB 9.1 (L) 09/17/2018   HCT 28.4 (L) 09/17/2018   MCV 93.7 09/17/2018   PLT 11 (L) 09/17/2018   Lab Results  Component Value Date   FERRITIN 77 05/31/2015   IRON 88 05/31/2015   TIBC 349 05/31/2015   UIBC 261 05/31/2015   IRONPCTSAT 25 05/31/2015   Lab Results  Component Value Date   RETICCTPCT 1.2 05/31/2015   RBC 3.03 (L) 09/17/2018   RETICCTABS 52.9 05/31/2015   No results found for: KPAFRELGTCHN, LAMBDASER, KAPLAMBRATIO Lab Results  Component Value Date    IGGSERUM 657 (L) 07/04/2018   No results found for: Odetta Pink, SPEI   Chemistry      Component Value Date/Time   NA 145 09/17/2018 1010   NA 135 (L) 07/12/2015 0813   K 4.2 09/17/2018 1010   K 4.5 07/12/2015 0813   CL 106 09/17/2018 1010   CO2 32 09/17/2018 1010   CO2 24 07/12/2015 0813   BUN 33 (H) 09/17/2018 1010   BUN 15.7 07/12/2015 0813   CREATININE 1.28 (H) 09/17/2018 1010   CREATININE 1.6 (H) 07/12/2015 0813      Component Value Date/Time   CALCIUM 9.3 09/17/2018 1010   CALCIUM 10.1 07/12/2015 0813   ALKPHOS 293 (H) 09/17/2018 1010   ALKPHOS 115 07/12/2015  0813   AST 34 09/17/2018 1010   AST 22 07/12/2015 0813   ALT 62 (H) 09/17/2018 1010   ALT 24 07/12/2015 0813   BILITOT 0.6 09/17/2018 1010   BILITOT 0.46 07/12/2015 0813       Impression and Plan: Mr. Poynter is a very pleasant 68 yo caucasian gentleman with past history of renal cell carcinoma as well as prostate cancer. He has now been diagnosed with metastatic small cell lung cancer.  He now has cellulitis secondary to the open wounds on the tops of both hands.  A culture was obtained from the wound on his left hand. Result is pending.  He was given 1 unit of platelets today.  We will treat him with IV Cubicin daily for the next 3 days.  He will start treatment with Augmentin on Saturday for 7 days.  He can stop the Declomycin per Dr. Marin Olp now that his sodium as corrected.  [place] of both lower extremities today was negative for DVT.  We will plan to see him back next week for follow-up and repeat lab work.  He will contact our office with any questions or concerns. We can certainly see him sooner if need be.   Laverna Peace, NP 2/19/202011:21 AM

## 2018-09-17 NOTE — Telephone Encounter (Signed)
Taylor Elliott from lab informed me of a critical platelet count of 11. Results given to MD.

## 2018-09-17 NOTE — Patient Instructions (Signed)
Cellulitis, Adult  Cellulitis is a skin infection. The infected area is often warm, red, swollen, and sore. It occurs most often in the arms and lower legs. It is very important to get treated for this condition. What are the causes? This condition is caused by bacteria. The bacteria enter through a break in the skin, such as a cut, burn, insect bite, open sore, or crack. What increases the risk? This condition is more likely to occur in people who:  Have a weak body defense system (immune system).  Have open cuts, burns, bites, or scrapes on the skin.  Are older than 67 years of age.  Have a blood sugar problem (diabetes).  Have a long-lasting (chronic) liver disease (cirrhosis) or kidney disease.  Are very overweight (obese).  Have a skin problem, such as: ? Itchy rash (eczema). ? Slow movement of blood in the veins (venous stasis). ? Fluid buildup below the skin (edema).  Have been treated with high-energy rays (radiation).  Use IV drugs. What are the signs or symptoms? Symptoms of this condition include:  Skin that is: ? Red. ? Streaking. ? Spotting. ? Swollen. ? Sore or painful when you touch it. ? Warm.  A fever.  Chills.  Blisters. How is this diagnosed? This condition is diagnosed based on:  Medical history.  Physical exam.  Blood tests.  Imaging tests. How is this treated? Treatment for this condition may include:  Medicines to treat infections or allergies.  Home care, such as: ? Rest. ? Placing cold or warm cloths (compresses) on the skin.  Hospital care, if the condition is very bad. Follow these instructions at home: Medicines  Take over-the-counter and prescription medicines only as told by your doctor.  If you were prescribed an antibiotic medicine, take it as told by your doctor. Do not stop taking it even if you start to feel better. General instructions   Drink enough fluid to keep your pee (urine) pale yellow.  Do not touch  or rub the infected area.  Raise (elevate) the infected area above the level of your heart while you are sitting or lying down.  Place cold or warm cloths on the area as told by your doctor.  Keep all follow-up visits as told by your doctor. This is important. Contact a doctor if:  You have a fever.  You do not start to get better after 1-2 days of treatment.  Your bone or joint under the infected area starts to hurt after the skin has healed.  Your infection comes back. This can happen in the same area or another area.  You have a swollen bump in the area.  You have new symptoms.  You feel ill and have muscle aches and pains. Get help right away if:  Your symptoms get worse.  You feel very sleepy.  You throw up (vomit) or have watery poop (diarrhea) for a long time.  You see red streaks coming from the area.  Your red area gets larger.  Your red area turns dark in color. These symptoms may represent a serious problem that is an emergency. Do not wait to see if the symptoms will go away. Get medical help right away. Call your local emergency services (911 in the [place] Do not drive yourself to the hospital. Summary  Cellulitis is a skin infection. The area is often warm, red, swollen, and sore.  This condition is treated with medicines, rest, and cold and warm cloths.  Take all medicines only   as told by your doctor.  Tell your doctor if symptoms do not start to get better after 1-2 days of treatment. This information is not intended to replace advice given to you by your health care provider. Make sure you discuss any questions you have with your health care provider. Document Released: 01/02/2008 Document Revised: 12/05/2017 Document Reviewed: 12/05/2017 Elsevier Interactive Patient Education  2019 [employer]. Platelet Transfusion A platelet transfusion is a procedure in which you receive donated platelets through an IV. Platelets are tiny pieces of blood cells.  When you get an injury, platelets clump together in the area to form a blood clot. This helps stop bleeding and is the beginning of the healing process. If you have too few platelets, your blood may have trouble clotting. This may cause you to bleed and bruise very easily. You may need a platelet transfusion if you have a condition that causes a low number of platelets (thrombocytopenia). A platelet transfusion may be used to stop or prevent excessive bleeding. Tell a health care provider about:  Any reactions you have had during previous transfusions.  Any allergies you have.  All medicines you are taking, including vitamins, herbs, eye drops, creams, and over-the-counter medicines.  Any blood disorders you have.  Any surgeries you have had.  Any medical conditions you have.  Whether you are pregnant or may be pregnant. What are the risks? Generally, this is a safe procedure. However, problems may occur, including:  Fever.  Infection.  Allergic reaction to the donor platelets.  Your body's disease-fighting system (immune system) attacking the donor platelets (hemolytic reaction). This is rare.  A rare reaction that causes lung damage (transfusion-related acute lung injury). What happens before the procedure? Medicines  Ask your health care provider about: ? Changing or stopping your regular medicines. This is especially important if you are taking diabetes medicines or blood thinners. ? Taking medicines such as aspirin and ibuprofen. These medicines can thin your blood. Do not take these medicines unless your health care provider tells you to take them. ? Taking over-the-counter medicines, vitamins, herbs, and supplements. General instructions  You will have a blood test to determine your blood type. Your blood type determines what kind of platelets you will be given.  Follow instructions from your health care provider about eating or drinking restrictions.  If you have had  an allergic reaction to a transfusion in the past, you may be given medicine to help prevent a reaction.  Your temperature, blood pressure, pulse, and breathing will be monitored. What happens during the procedure?   An IV will be inserted into one of your veins.  For your safety, two health care providers will verify your identity along with the donor platelets about to be infused.  A bag of donor platelets will be connected to your IV. The platelets will flow into your bloodstream. This usually takes 30-60 minutes.  Your temperature, blood pressure, pulse, and breathing will be monitored during the transfusion. This helps detect early signs of any reaction.  You will also be monitored for other symptoms that may indicate a reaction, including chills, hives, or itching.  If you have signs of a reaction at any time, your transfusion will be stopped, and you may be given medicine to help manage the reaction.  When your transfusion is complete, your IV will be removed.  Pressure may be applied to the IV site for a few minutes to stop any bleeding.  The IV site will be covered  with a bandage (dressing). The procedure may vary among health care providers and hospitals. What happens after the procedure?  Your blood pressure, temperature, pulse, and breathing will be monitored until you leave the hospital or clinic.  You may have some bruising and soreness at your IV site. Follow these instructions at home: Medicines  Take over-the-counter and prescription medicines only as told by your health care provider.  Talk with your health care provider before you take any medicines that contain aspirin or NSAIDs. These medicines increase your risk for dangerous bleeding. General instructions  Change or remove your dressing as told by your health care provider.  Return to your normal activities as told by your health care provider. Ask your health care provider what activities are safe for  you.  Do not take baths, swim, or use a hot tub until your health care provider approves. Ask your health care provider if you may take showers.  Check your IV site every day for signs of infection. Check for: ? Redness, swelling, or pain. ? Fluid or blood. If fluid or blood drains from your IV site, use your hands to press down firmly on a bandage covering the area for a minute or two. Doing this should stop the bleeding. ? Warmth. ? Pus or a bad smell.  Keep all follow-up visits as told by your health care provider. This is important. Contact a health care provider if you have:  A headache that does not go away with medicine.  Hives, rash, or itchy skin.  Nausea or vomiting.  Unusual tiredness or weakness.  Signs of infection at your IV site. Get help right away if:  You have a fever or chills.  You urinate less often than usual.  Your urine is darker colored than normal.  You have any of the following: ? Trouble breathing. ? Pain in your back, abdomen, or chest. ? Cool, clammy skin. ? A fast heartbeat. Summary  Platelets are tiny pieces of blood cells that clump together to form a blood clot when you have an injury. If you have too few platelets, your blood may have trouble clotting.  A platelet transfusion is a procedure in which you receive donated platelets through an IV.  A platelet transfusion may be used to stop or prevent excessive bleeding.  After the procedure, check your IV site every day for signs of infection, including redness, swelling, pain, or warmth. This information is not intended to replace advice given to you by your health care provider. Make sure you discuss any questions you have with your health care provider. Document Released: 05/13/2007 Document Revised: 08/21/2017 Document Reviewed: 08/21/2017 Elsevier Interactive Patient Education  2019 [employer].

## 2018-09-18 ENCOUNTER — Encounter: Payer: Self-pay | Admitting: Hematology & Oncology

## 2018-09-18 ENCOUNTER — Inpatient Hospital Stay: Payer: 59

## 2018-09-18 ENCOUNTER — Telehealth: Payer: Self-pay | Admitting: Family

## 2018-09-18 ENCOUNTER — Telehealth: Payer: Self-pay | Admitting: Hematology & Oncology

## 2018-09-18 ENCOUNTER — Other Ambulatory Visit: Payer: Self-pay

## 2018-09-18 VITALS — BP 157/72 | HR 110 | Temp 98.0°F | Resp 20

## 2018-09-18 DIAGNOSIS — C787 Secondary malignant neoplasm of liver and intrahepatic bile duct: Secondary | ICD-10-CM | POA: Diagnosis not present

## 2018-09-18 DIAGNOSIS — L03119 Cellulitis of unspecified part of limb: Secondary | ICD-10-CM

## 2018-09-18 DIAGNOSIS — C7951 Secondary malignant neoplasm of bone: Secondary | ICD-10-CM | POA: Diagnosis not present

## 2018-09-18 DIAGNOSIS — Z5189 Encounter for other specified aftercare: Secondary | ICD-10-CM | POA: Diagnosis not present

## 2018-09-18 DIAGNOSIS — L03115 Cellulitis of right lower limb: Secondary | ICD-10-CM | POA: Diagnosis not present

## 2018-09-18 DIAGNOSIS — L03114 Cellulitis of left upper limb: Secondary | ICD-10-CM | POA: Diagnosis not present

## 2018-09-18 DIAGNOSIS — Z5112 Encounter for antineoplastic immunotherapy: Secondary | ICD-10-CM | POA: Diagnosis not present

## 2018-09-18 DIAGNOSIS — Z5111 Encounter for antineoplastic chemotherapy: Secondary | ICD-10-CM | POA: Diagnosis not present

## 2018-09-18 DIAGNOSIS — D696 Thrombocytopenia, unspecified: Secondary | ICD-10-CM | POA: Diagnosis not present

## 2018-09-18 DIAGNOSIS — C3491 Malignant neoplasm of unspecified part of right bronchus or lung: Secondary | ICD-10-CM | POA: Diagnosis not present

## 2018-09-18 LAB — BPAM PLATELET PHERESIS
Blood Product Expiration Date: 202002202359
ISSUE DATE / TIME: 202002191240
Unit Type and Rh: 7300

## 2018-09-18 LAB — TSH: TSH: 3.655 u[IU]/mL (ref 0.320–4.118)

## 2018-09-18 LAB — PREPARE PLATELET PHERESIS: Unit division: 0

## 2018-09-18 LAB — T4: T4, Total: 8 ug/dL (ref 4.5–12.0)

## 2018-09-18 LAB — LACTATE DEHYDROGENASE: LDH: 374 U/L — ABNORMAL HIGH (ref 98–192)

## 2018-09-18 MED ORDER — SODIUM CHLORIDE 0.9 % IV SOLN
500.0000 mg | Freq: Every day | INTRAVENOUS | Status: DC
  Administered 2018-09-18: 500 mg via INTRAVENOUS
  Filled 2018-09-18: qty 10

## 2018-09-18 MED ORDER — AMOXICILLIN-POT CLAVULANATE 875-125 MG PO TABS: 1.0000 | ORAL_TABLET | Freq: Two times a day (BID) | ORAL | 0 refills | Status: DC

## 2018-09-18 MED ORDER — SODIUM CHLORIDE 0.9 % IV SOLN
INTRAVENOUS | Status: DC
  Administered 2018-09-18: 11:00:00 via INTRAVENOUS
  Filled 2018-09-18: qty 250

## 2018-09-18 NOTE — Telephone Encounter (Signed)
No los 2/19

## 2018-09-18 NOTE — Patient Instructions (Signed)
Daptomycin injection What is this medicine? DAPTOMYCIN (DAP toe MYE sin) is a lipopeptide antibiotic. It is used to treat certain kinds of bacterial infections. It will not work for colds, flu, or other viral infections. This medicine may be used for other purposes; ask your health care provider or pharmacist if you have questions. COMMON BRAND NAME(S): Cubicin, Cubicin RF What should I tell my health care provider before I take this medicine? They need to know if you have any of these conditions: -kidney disease -an unusual or allergic reaction to daptomycin, other medicines, foods, dyes, or preservatives -pregnant or trying to get pregnant -breast-feeding How should I use this medicine? This medicine is for infusion into a vein. It is usually given by a health care professional in a hospital or clinic setting. If you get this medicine at home, you will be taught how to prepare and give this medicine. Use exactly as directed. Take your medicine at regular intervals. Do not take your medicine more often than directed. Take all of your medicine as directed even if you think you are better. Do not skip doses or stop your medicine early. It is important that you put your used needles and syringes in a special sharps container. Do not put them in a trash can. If you do not have a sharps container, call your pharmacist or healthcare provider to get one. Talk to your pediatrician regarding the use of this medicine in children. While this drug may be prescribed for children as young as 1 year for selected conditions, precautions do apply. Overdosage: If you think you have taken too much of this medicine contact a poison control center or emergency room at once. NOTE: This medicine is only for you. Do not share this medicine with others. What if I miss a dose? If you miss a dose, take it as soon as you can. If it is almost time for your next dose, take only that dose. Do not take double or extra  doses. What may interact with this medicine? -birth control pills -some antibiotics like tobramycin This list may not describe all possible interactions. Give your health care provider a list of all the medicines, herbs, non-prescription drugs, or dietary supplements you use. Also tell them if you smoke, drink alcohol, or use illegal drugs. Some items may interact with your medicine. What should I watch for while using this medicine? Your condition will be monitored carefully while you are receiving this medicine. Do not treat diarrhea with over the counter products. Contact your doctor if you have diarrhea that lasts more than 2 days or if it is severe and watery. What side effects may I notice from receiving this medicine? Side effects that you should report to your doctor or health care professional as soon as possible: -allergic reactions like skin rash, itching or hives, swelling of the face, lips, or tongue -breathing problems -fever, infection -high or low blood pressure -muscle pain -numb or tingling pain -trouble passing urine or change in the amount of urine -unusually tired or weak -vomiting Side effects that usually do not require medical attention (report to your doctor or health care professional if they continue or are bothersome): -constipation or diarrhea -trouble sleeping -headache -nausea -stomach upset This list may not describe all possible side effects. Call your doctor for medical advice about side effects. You may report side effects to FDA at 1-800-FDA-1088. Where should I keep my medicine? Keep out of the reach of children. If you are using  this medicine at home, you will be instructed on how to store this medicine. Throw away any unused medicine after the expiration date on the label. NOTE: This sheet is a summary. It may not cover all possible information. If you have questions about this medicine, talk to your doctor, pharmacist, or health care provider.   2019 Elsevier/Gold Standard (2015-10-28 11:21:16)

## 2018-09-19 ENCOUNTER — Inpatient Hospital Stay: Payer: 59

## 2018-09-19 ENCOUNTER — Other Ambulatory Visit: Payer: Self-pay | Admitting: Hematology & Oncology

## 2018-09-19 VITALS — BP 137/69 | HR 111 | Temp 98.0°F | Resp 19

## 2018-09-19 DIAGNOSIS — Z5189 Encounter for other specified aftercare: Secondary | ICD-10-CM | POA: Diagnosis not present

## 2018-09-19 DIAGNOSIS — C3491 Malignant neoplasm of unspecified part of right bronchus or lung: Secondary | ICD-10-CM | POA: Diagnosis not present

## 2018-09-19 DIAGNOSIS — Z5112 Encounter for antineoplastic immunotherapy: Secondary | ICD-10-CM | POA: Diagnosis not present

## 2018-09-19 DIAGNOSIS — L03119 Cellulitis of unspecified part of limb: Secondary | ICD-10-CM

## 2018-09-19 DIAGNOSIS — L03114 Cellulitis of left upper limb: Secondary | ICD-10-CM | POA: Diagnosis not present

## 2018-09-19 DIAGNOSIS — Z5111 Encounter for antineoplastic chemotherapy: Secondary | ICD-10-CM | POA: Diagnosis not present

## 2018-09-19 DIAGNOSIS — C787 Secondary malignant neoplasm of liver and intrahepatic bile duct: Secondary | ICD-10-CM | POA: Diagnosis not present

## 2018-09-19 DIAGNOSIS — D696 Thrombocytopenia, unspecified: Secondary | ICD-10-CM | POA: Diagnosis not present

## 2018-09-19 DIAGNOSIS — C7951 Secondary malignant neoplasm of bone: Secondary | ICD-10-CM | POA: Diagnosis not present

## 2018-09-19 DIAGNOSIS — L03115 Cellulitis of right lower limb: Secondary | ICD-10-CM | POA: Diagnosis not present

## 2018-09-19 MED ORDER — HEPARIN SOD (PORK) LOCK FLUSH 100 UNIT/ML IV SOLN
500.0000 [IU] | Freq: Once | INTRAVENOUS | Status: AC
  Administered 2018-09-19: 500 [IU] via INTRAVENOUS
  Filled 2018-09-19: qty 5

## 2018-09-19 MED ORDER — SODIUM CHLORIDE 0.9% FLUSH
10.0000 mL | INTRAVENOUS | Status: DC | PRN
  Administered 2018-09-19: 10 mL via INTRAVENOUS
  Filled 2018-09-19: qty 10

## 2018-09-19 MED ORDER — SODIUM CHLORIDE 0.9 % IV SOLN
500.0000 mg | Freq: Every day | INTRAVENOUS | Status: DC
  Administered 2018-09-19: 500 mg via INTRAVENOUS
  Filled 2018-09-19: qty 10

## 2018-09-19 MED FILL — AMOX-CLAV 875-125 MG TABLET: 875-125 | 7 days supply | Qty: 14 | Fill #0

## 2018-09-19 NOTE — Patient Instructions (Signed)

## 2018-09-20 LAB — AEROBIC CULTURE W GRAM STAIN (SUPERFICIAL SPECIMEN): Gram Stain: NONE SEEN

## 2018-09-22 ENCOUNTER — Ambulatory Visit (INDEPENDENT_AMBULATORY_CARE_PROVIDER_SITE_OTHER): Payer: 59 | Admitting: Orthopedic Surgery

## 2018-09-22 ENCOUNTER — Encounter (INDEPENDENT_AMBULATORY_CARE_PROVIDER_SITE_OTHER): Payer: Self-pay | Admitting: Orthopedic Surgery

## 2018-09-22 VITALS — Ht 68.0 in | Wt 192.0 lb

## 2018-09-22 DIAGNOSIS — Z89432 Acquired absence of left foot: Secondary | ICD-10-CM | POA: Diagnosis not present

## 2018-09-22 DIAGNOSIS — L97511 Non-pressure chronic ulcer of other part of right foot limited to breakdown of skin: Secondary | ICD-10-CM | POA: Diagnosis not present

## 2018-09-22 DIAGNOSIS — L98491 Non-pressure chronic ulcer of skin of other sites limited to breakdown of skin: Secondary | ICD-10-CM
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
## 2018-09-24 ENCOUNTER — Inpatient Hospital Stay: Payer: 59

## 2018-09-24 ENCOUNTER — Inpatient Hospital Stay (HOSPITAL_BASED_OUTPATIENT_CLINIC_OR_DEPARTMENT_OTHER): Payer: 59 | Admitting: Family

## 2018-09-24 ENCOUNTER — Other Ambulatory Visit: Payer: Self-pay

## 2018-09-24 ENCOUNTER — Ambulatory Visit (HOSPITAL_BASED_OUTPATIENT_CLINIC_OR_DEPARTMENT_OTHER)
Admission: RE | Admit: 2018-09-24 | Discharge: 2018-09-24 | Disposition: A | Discharge status: Home / Self Care | Payer: 59 | Source: Ambulatory Visit | Attending: Family | Admitting: Family

## 2018-09-24 VITALS — BP 138/67 | HR 99 | Temp 98.5°F | Resp 19 | Wt 201.0 lb

## 2018-09-24 DIAGNOSIS — C3491 Malignant neoplasm of unspecified part of right bronchus or lung: Secondary | ICD-10-CM | POA: Insufficient documentation

## 2018-09-24 DIAGNOSIS — C787 Secondary malignant neoplasm of liver and intrahepatic bile duct: Secondary | ICD-10-CM | POA: Diagnosis not present

## 2018-09-24 DIAGNOSIS — J439 Emphysema, unspecified: Secondary | ICD-10-CM | POA: Diagnosis not present

## 2018-09-24 DIAGNOSIS — E032 Hypothyroidism due to medicaments and other exogenous substances: Secondary | ICD-10-CM

## 2018-09-24 DIAGNOSIS — D696 Thrombocytopenia, unspecified: Secondary | ICD-10-CM | POA: Diagnosis not present

## 2018-09-24 DIAGNOSIS — Z85528 Personal history of other malignant neoplasm of kidney: Secondary | ICD-10-CM | POA: Diagnosis not present

## 2018-09-24 DIAGNOSIS — Z5112 Encounter for antineoplastic immunotherapy: Secondary | ICD-10-CM | POA: Diagnosis not present

## 2018-09-24 DIAGNOSIS — Z8546 Personal history of malignant neoplasm of prostate: Secondary | ICD-10-CM

## 2018-09-24 DIAGNOSIS — L03114 Cellulitis of left upper limb: Secondary | ICD-10-CM | POA: Diagnosis not present

## 2018-09-24 DIAGNOSIS — Z5189 Encounter for other specified aftercare: Secondary | ICD-10-CM | POA: Diagnosis not present

## 2018-09-24 DIAGNOSIS — Z95828 Presence of other vascular implants and grafts: Secondary | ICD-10-CM

## 2018-09-24 DIAGNOSIS — L03115 Cellulitis of right lower limb: Secondary | ICD-10-CM | POA: Diagnosis not present

## 2018-09-24 DIAGNOSIS — R600 Localized edema: Secondary | ICD-10-CM

## 2018-09-24 DIAGNOSIS — L03119 Cellulitis of unspecified part of limb: Secondary | ICD-10-CM

## 2018-09-24 DIAGNOSIS — C7951 Secondary malignant neoplasm of bone: Secondary | ICD-10-CM

## 2018-09-24 DIAGNOSIS — D509 Iron deficiency anemia, unspecified: Secondary | ICD-10-CM

## 2018-09-24 DIAGNOSIS — Z5111 Encounter for antineoplastic chemotherapy: Secondary | ICD-10-CM | POA: Diagnosis not present

## 2018-09-24 LAB — CBC WITH DIFFERENTIAL (CANCER CENTER ONLY)
Abs Immature Granulocytes: 0.58 10*3/uL — ABNORMAL HIGH (ref 0.00–0.07)
Basophils Absolute: 0 10*3/uL (ref 0.0–0.1)
Basophils Relative: 0 %
Eosinophils Absolute: 0 10*3/uL (ref 0.0–0.5)
Eosinophils Relative: 1 %
HCT: 23.9 % — ABNORMAL LOW (ref 39.0–52.0)
Hemoglobin: 7.5 g/dL — ABNORMAL LOW (ref 13.0–17.0)
Immature Granulocytes: 9 %
Lymphocytes Relative: 11 %
Lymphs Abs: 0.8 10*3/uL (ref 0.7–4.0)
MCH: 30.4 pg (ref 26.0–34.0)
MCHC: 31.4 g/dL (ref 30.0–36.0)
MCV: 96.8 fL (ref 80.0–100.0)
Monocytes Absolute: 0.4 10*3/uL (ref 0.1–1.0)
Monocytes Relative: 6 %
Neutro Abs: 5.1 10*3/uL (ref 1.7–7.7)
Neutrophils Relative %: 73 %
Platelet Count: 66 10*3/uL — ABNORMAL LOW (ref 150–400)
RBC: 2.47 MIL/uL — ABNORMAL LOW (ref 4.22–5.81)
RDW: 15.9 % — ABNORMAL HIGH (ref 11.5–15.5)
WBC Count: 6.9 10*3/uL (ref 4.0–10.5)
nRBC: 0.4 % — ABNORMAL HIGH (ref 0.0–0.2)

## 2018-09-24 LAB — CMP (CANCER CENTER ONLY)
ALT: 23 U/L (ref 0–44)
AST: 20 U/L (ref 15–41)
Albumin: 3.4 g/dL — ABNORMAL LOW (ref 3.5–5.0)
Alkaline Phosphatase: 207 U/L — ABNORMAL HIGH (ref 38–126)
Anion gap: 8 (ref 5–15)
BUN: 18 mg/dL (ref 8–23)
CO2: 28 mmol/L (ref 22–32)
Calcium: 8.5 mg/dL — ABNORMAL LOW (ref 8.9–10.3)
Chloride: 107 mmol/L (ref 98–111)
Creatinine: 1.25 mg/dL — ABNORMAL HIGH (ref 0.61–1.24)
GFR, Est AFR Am: >60 mL/min (ref >60)
GFR, Estimated: 60 mL/min — ABNORMAL LOW (ref >60)
Glucose, Bld: 132 mg/dL — ABNORMAL HIGH (ref 70–99)
Potassium: 3.9 mmol/L (ref 3.5–5.1)
Sodium: 143 mmol/L (ref 135–145)
Total Bilirubin: 0.3 mg/dL (ref 0.3–1.2)
Total Protein: 5.8 g/dL — ABNORMAL LOW (ref 6.5–8.1)

## 2018-09-24 LAB — SAMPLE TO BLOOD BANK

## 2018-09-24 MED ORDER — SODIUM CHLORIDE 0.9% FLUSH
10.0000 mL | INTRAVENOUS | Status: DC | PRN
  Administered 2018-09-24: 10 mL via INTRAVENOUS
  Filled 2018-09-24: qty 10

## 2018-09-24 MED ORDER — HEPARIN SOD (PORK) LOCK FLUSH 100 UNIT/ML IV SOLN
500.0000 [IU] | Freq: Once | INTRAVENOUS | Status: AC
  Administered 2018-09-24: 500 [IU] via INTRAVENOUS
  Filled 2018-09-24: qty 5

## 2018-09-24 NOTE — Patient Instructions (Signed)

## 2018-09-24 NOTE — Progress Notes (Addendum)
Hematology and Oncology Follow Up Visit  ZEPPELIN COMMISSO [id number] 1950/12/27 68 y.o. 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 is here today with his wife for follow-up. His hands are looking much better. He is using shea butter and hie sores have have scabbed over and are drying out nicely. No drainage. He still has some swelling but this along with the redness improved in both hands tremendously. Radial pulses are 3+.  His ALT and AST are now within normal limits. Alk/Phos is down to 207. Sodium stable at 143.  His platelet count is up to 66 after receiving 1 pack of platelets last week. He does bruise easily but denies any episodes of bleeding.  Edema in his lower extremities appears to be better. No weeping. Pedal pulses are 2+.  No fever, chills, n/v, cough, dizziness, chest pain, palpitations, abdominal pain or changes in bowel or bladder habit.  No numbness or tingling in his extremities.  No lymphadenopathy noted on exam.  He has maintained a good appetite and is staying well hydrated.   ECOG Performance Status: 1 - Symptomatic but completely ambulatory  Medications:  Allergies as of 09/24/2018      Reactions   Bee Venom Anaphylaxis   Clindamycin/lincomycin Hives      Medication List       Accurate as of September 24, 2018  9:47 AM. Always use your most recent med list.        allopurinol 100 MG tablet Commonly known as:  ZYLOPRIM Take 2 tablets (200 mg total) by mouth every morning.   amLODipine 10 MG tablet Commonly known as:  NORVASC TAKE 1 TABLET BY MOUTH EVERY MORNING   amoxicillin-clavulanate 875-125 MG tablet Commonly known as:  AUGMENTIN Take 1 tablet by mouth 2 (two) times daily.   aspirin EC 81  MG tablet Take 81 mg by mouth daily.   calcium carbonate 500 MG chewable tablet Commonly known as:  TUMS - dosed in mg elemental calcium Chew 2 tablets by mouth daily as needed for indigestion or heartburn.   diphenhydrAMINE 25 MG tablet Commonly known as:  SOMINEX Take 25 mg by mouth at bedtime as needed for sleep.   EPINEPHrine 0.3 mg/0.3 mL Soaj injection Commonly known as:  EPI-PEN Inject 0.3 mg into the muscle once.   fenofibrate 160 MG tablet Take 1 tablet (160 mg total) by mouth daily.   HYDROcodone-homatropine 5-1.5 MG/5ML syrup Commonly known as:  HYCODAN Take 5 mLs by mouth every 4 (four) hours as needed for cough.   lidocaine 2 % solution Commonly known as:  XYLOCAINE   lidocaine-prilocaine cream Commonly known as:  EMLA Apply to affected area once   lisinopril 20 MG tablet Commonly known as:  PRINIVIL,ZESTRIL TAKE 1 TABLET BY MOUTH ONCE DAILY   LORazepam 0.5 MG tablet Commonly known as:  ATIVAN Take 1 tablet (0.5 mg total) by mouth every 6 (six) hours as needed (Nausea or vomiting).   magic mouthwash w/lidocaine Soln Take 5 mLs by mouth 3 (three) times daily as needed for mouth pain. benadryl 525 mg, hydrocortisone 60 mg and nystatin 0.6 mg.   multivitamin tablet Take 1 tablet by mouth daily.   ondansetron 8 MG tablet Commonly known as:  ZOFRAN Take 1 tablet (8 mg  total) by mouth 2 (two) times daily as needed for refractory nausea / vomiting. Start on day 3 after carboplatin chemo.   oxyCODONE 5 MG immediate release tablet Commonly known as:  Oxy IR/ROXICODONE Take 1 tablet (5 mg total) by mouth every 6 (six) hours as needed for severe pain.   prochlorperazine 10 MG tablet Commonly known as:  COMPAZINE Take 1 tablet (10 mg total) by mouth every 6 (six) hours as needed (Nausea or vomiting).   rosuvastatin 10 MG tablet Commonly known as:  CRESTOR TAKE 1 TABLET BY MOUTH ONCE DAILY   solifenacin 10 MG tablet Commonly known as:  VESICARE Take 10 mg  by mouth every evening.   UNABLE TO FIND Med Name: Allergy shots.       Allergies:  Allergies  Allergen Reactions  . Bee Venom Anaphylaxis  . Clindamycin/Lincomycin Hives    Past Medical History, Surgical history, Social history, and Family History were reviewed and updated.  Review of Systems: All other 10 point review of systems is negative.   Physical Exam:  weight is 201 lb (91.2 kg). His oral temperature is 98.5 F (36.9 C). His blood pressure is 138/67 and his pulse is 99. His respiration is 19 and oxygen saturation is 100%.   Wt Readings from Last 3 Encounters:  09/24/18 201 lb (91.2 kg)  09/22/18 192 lb (87.1 kg)  09/05/18 192 lb (87.1 kg)    Ocular: Sclerae unicteric, pupils equal, round and reactive to light Ear-nose-throat: Oropharynx clear, dentition fair Lymphatic: No cervical, supraclavicular or axillary adenopathy Lungs no rales or rhonchi, good excursion bilaterally Heart regular rate and rhythm, no murmur appreciated Abd soft, nontender, positive bowel sounds, no liver or spleen tip palpated on exam, no fluid wave  MSK no focal spinal tenderness, no joint edema Neuro: non-focal, well-oriented, appropriate affect Breasts: Deferred   Lab Results  Component Value Date   WBC 6.9 09/24/2018   HGB 7.5 (L) 09/24/2018   HCT 23.9 (L) 09/24/2018   MCV 96.8 09/24/2018   PLT 66 (L) 09/24/2018   Lab Results  Component Value Date   FERRITIN 77 05/31/2015   IRON 88 05/31/2015   TIBC 349 05/31/2015   UIBC 261 05/31/2015   IRONPCTSAT 25 05/31/2015   Lab Results  Component Value Date   RETICCTPCT 1.2 05/31/2015   RBC 2.47 (L) 09/24/2018   RETICCTABS 52.9 05/31/2015   No results found for: KPAFRELGTCHN, LAMBDASER, KAPLAMBRATIO Lab Results  Component Value Date   IGGSERUM 657 (L) 07/04/2018   No results found for: Odetta Pink, SPEI   Chemistry      Component Value Date/Time   NA 145 09/17/2018  1010   NA 135 (L) 07/12/2015 0813   K 4.2 09/17/2018 1010   K 4.5 07/12/2015 0813   CL 106 09/17/2018 1010   CO2 32 09/17/2018 1010   CO2 24 07/12/2015 0813   BUN 33 (H) 09/17/2018 1010   BUN 15.7 07/12/2015 0813   CREATININE 1.28 (H) 09/17/2018 1010   CREATININE 1.6 (H) 07/12/2015 0813      Component Value Date/Time   CALCIUM 9.3 09/17/2018 1010   CALCIUM 10.1 07/12/2015 0813   ALKPHOS 293 (H) 09/17/2018 1010   ALKPHOS 115 07/12/2015 0813   AST 34 09/17/2018 1010   AST 22 07/12/2015 0813   ALT 62 (H) 09/17/2018 1010   ALT 24 07/12/2015 0813   BILITOT 0.6 09/17/2018 1010   BILITOT 0.46 07/12/2015 0813  Impression and Plan: Mr. Dirico is a very pleasant 68 yo caucasian gentleman with past history of renal cell carcinoma as well as prostate cancer. He has now been diagnosed with metastatic small cell lung cancer.  The cellulitis and open lesions on his hands have improved nicely since last week. The redness and swelling is resolving and the open sores are drying up.  His LFT's are back within normal limits which is very encouraging. We will repeat a PET scan 2 weeks after cycle 2.  Chest x-ray today showed no acute disease.  We will see him back next week for  Day 1 of cycle 2.  They will contact our office with any questions or concerns. We can certainly see him sooner if need be.   Laverna Peace, NP 2/26/20209:47 AM

## 2018-09-25 ENCOUNTER — Encounter: Payer: Self-pay | Admitting: Family

## 2018-09-25 DIAGNOSIS — N3946 Mixed incontinence: Secondary | ICD-10-CM | POA: Diagnosis not present

## 2018-09-26 ENCOUNTER — Other Ambulatory Visit: Payer: Self-pay | Admitting: Hematology & Oncology

## 2018-09-26 MED ORDER — HYDROCODONE-HOMATROPINE 5-1.5 MG/5ML PO SYRP: 5.0000 mL | ORAL_SOLUTION | ORAL | 0 refills | Status: DC | PRN

## 2018-09-26 MED FILL — HYDROCODONE-HOMATROPINE SYR: 5-1.5 | 15 days supply | Qty: 250 | Fill #0

## 2018-09-29 ENCOUNTER — Encounter: Payer: Self-pay | Admitting: Hematology & Oncology

## 2018-09-30 ENCOUNTER — Encounter: Payer: Self-pay | Admitting: Hematology & Oncology

## 2018-10-01 ENCOUNTER — Inpatient Hospital Stay: Payer: 59

## 2018-10-01 ENCOUNTER — Other Ambulatory Visit: Payer: Self-pay | Admitting: *Deleted

## 2018-10-01 ENCOUNTER — Inpatient Hospital Stay: Payer: 59 | Attending: Hematology & Oncology

## 2018-10-01 ENCOUNTER — Encounter: Payer: Self-pay | Admitting: *Deleted

## 2018-10-01 ENCOUNTER — Other Ambulatory Visit: Payer: Self-pay

## 2018-10-01 ENCOUNTER — Inpatient Hospital Stay (HOSPITAL_BASED_OUTPATIENT_CLINIC_OR_DEPARTMENT_OTHER): Payer: 59 | Admitting: Hematology & Oncology

## 2018-10-01 ENCOUNTER — Telehealth: Payer: Self-pay | Admitting: Hematology & Oncology

## 2018-10-01 ENCOUNTER — Other Ambulatory Visit: Payer: Self-pay | Admitting: Family Medicine

## 2018-10-01 ENCOUNTER — Encounter: Payer: Self-pay | Admitting: Hematology & Oncology

## 2018-10-01 VITALS — BP 155/82 | HR 76 | Temp 98.7°F | Resp 20 | Wt 200.1 lb

## 2018-10-01 DIAGNOSIS — Z8546 Personal history of malignant neoplasm of prostate: Secondary | ICD-10-CM | POA: Diagnosis not present

## 2018-10-01 DIAGNOSIS — I10 Essential (primary) hypertension: Secondary | ICD-10-CM

## 2018-10-01 DIAGNOSIS — R05 Cough: Secondary | ICD-10-CM

## 2018-10-01 DIAGNOSIS — Z5112 Encounter for antineoplastic immunotherapy: Secondary | ICD-10-CM | POA: Insufficient documentation

## 2018-10-01 DIAGNOSIS — C7951 Secondary malignant neoplasm of bone: Secondary | ICD-10-CM

## 2018-10-01 DIAGNOSIS — C3491 Malignant neoplasm of unspecified part of right bronchus or lung: Secondary | ICD-10-CM | POA: Diagnosis not present

## 2018-10-01 DIAGNOSIS — D6481 Anemia due to antineoplastic chemotherapy: Secondary | ICD-10-CM | POA: Insufficient documentation

## 2018-10-01 DIAGNOSIS — Z5111 Encounter for antineoplastic chemotherapy: Secondary | ICD-10-CM | POA: Diagnosis not present

## 2018-10-01 DIAGNOSIS — D509 Iron deficiency anemia, unspecified: Secondary | ICD-10-CM

## 2018-10-01 DIAGNOSIS — C787 Secondary malignant neoplasm of liver and intrahepatic bile duct: Secondary | ICD-10-CM

## 2018-10-01 DIAGNOSIS — Z85528 Personal history of other malignant neoplasm of kidney: Secondary | ICD-10-CM

## 2018-10-01 DIAGNOSIS — E032 Hypothyroidism due to medicaments and other exogenous substances: Secondary | ICD-10-CM

## 2018-10-01 LAB — CMP (CANCER CENTER ONLY)
ALT: 15 U/L (ref 0–44)
AST: 17 U/L (ref 15–41)
Albumin: 3.7 g/dL (ref 3.5–5.0)
Alkaline Phosphatase: 172 U/L — ABNORMAL HIGH (ref 38–126)
Anion gap: 7 (ref 5–15)
BUN: 16 mg/dL (ref 8–23)
CO2: 27 mmol/L (ref 22–32)
Calcium: 9.1 mg/dL (ref 8.9–10.3)
Chloride: 106 mmol/L (ref 98–111)
Creatinine: 1.15 mg/dL (ref 0.61–1.24)
GFR, Est AFR Am: >60 mL/min (ref >60)
GFR, Estimated: >60 mL/min (ref >60)
Glucose, Bld: 96 mg/dL (ref 70–99)
Potassium: 4.1 mmol/L (ref 3.5–5.1)
Sodium: 140 mmol/L (ref 135–145)
Total Bilirubin: 0.4 mg/dL (ref 0.3–1.2)
Total Protein: 6.3 g/dL — ABNORMAL LOW (ref 6.5–8.1)

## 2018-10-01 LAB — CBC WITH DIFFERENTIAL (CANCER CENTER ONLY)
Abs Immature Granulocytes: 0.11 10*3/uL — ABNORMAL HIGH (ref 0.00–0.07)
Basophils Absolute: 0.1 10*3/uL (ref 0.0–0.1)
Basophils Relative: 1 %
Eosinophils Absolute: 0 10*3/uL (ref 0.0–0.5)
Eosinophils Relative: 0 %
HCT: 25.9 % — ABNORMAL LOW (ref 39.0–52.0)
Hemoglobin: 8.1 g/dL — ABNORMAL LOW (ref 13.0–17.0)
Immature Granulocytes: 1 %
Lymphocytes Relative: 8 %
Lymphs Abs: 0.7 10*3/uL (ref 0.7–4.0)
MCH: 30.8 pg (ref 26.0–34.0)
MCHC: 31.3 g/dL (ref 30.0–36.0)
MCV: 98.5 fL (ref 80.0–100.0)
Monocytes Absolute: 0.8 10*3/uL (ref 0.1–1.0)
Monocytes Relative: 10 %
Neutro Abs: 7 10*3/uL (ref 1.7–7.7)
Neutrophils Relative %: 80 %
Platelet Count: 380 10*3/uL (ref 150–400)
RBC: 2.63 MIL/uL — ABNORMAL LOW (ref 4.22–5.81)
RDW: 19.2 % — ABNORMAL HIGH (ref 11.5–15.5)
WBC Count: 8.7 10*3/uL (ref 4.0–10.5)
nRBC: 0.7 % — ABNORMAL HIGH (ref 0.0–0.2)

## 2018-10-01 LAB — SAMPLE TO BLOOD BANK

## 2018-10-01 MED ORDER — IPRATROPIUM-ALBUTEROL 0.5-2.5 (3) MG/3ML IN SOLN
3.0000 mL | Freq: Four times a day (QID) | RESPIRATORY_TRACT | Status: DC
  Administered 2018-10-01: 3 mL via RESPIRATORY_TRACT

## 2018-10-01 MED ORDER — PALONOSETRON HCL INJECTION 0.25 MG/5ML
INTRAVENOUS | Status: AC
  Filled 2018-10-01: qty 5

## 2018-10-01 MED ORDER — PALONOSETRON HCL INJECTION 0.25 MG/5ML
0.2500 mg | Freq: Once | INTRAVENOUS | Status: AC
  Administered 2018-10-01: 0.25 mg via INTRAVENOUS

## 2018-10-01 MED ORDER — HEPARIN SOD (PORK) LOCK FLUSH 100 UNIT/ML IV SOLN
500.0000 [IU] | Freq: Once | INTRAVENOUS | Status: AC | PRN
  Administered 2018-10-01: 500 [IU]
  Filled 2018-10-01: qty 5

## 2018-10-01 MED ORDER — SODIUM CHLORIDE 0.9 % IV SOLN
Freq: Once | INTRAVENOUS | Status: AC
  Administered 2018-10-01: 11:00:00 via INTRAVENOUS
  Filled 2018-10-01: qty 250

## 2018-10-01 MED ORDER — SODIUM CHLORIDE 0.9% FLUSH
10.0000 mL | INTRAVENOUS | Status: DC | PRN
  Administered 2018-10-01: 10 mL
  Filled 2018-10-01: qty 10

## 2018-10-01 MED ORDER — SODIUM CHLORIDE 0.9 % IV SOLN
60.0000 mg/m2 | Freq: Once | INTRAVENOUS | Status: AC
  Administered 2018-10-01: 120 mg via INTRAVENOUS
  Filled 2018-10-01: qty 6

## 2018-10-01 MED ORDER — LORAZEPAM 0.5 MG PO TABS: 0.5000 mg | ORAL_TABLET | Freq: Four times a day (QID) | ORAL | 0 refills | Status: DC | PRN

## 2018-10-01 MED ORDER — SODIUM CHLORIDE 0.9 % IV SOLN
462.6000 mg | Freq: Once | INTRAVENOUS | Status: AC
  Administered 2018-10-01: 460 mg via INTRAVENOUS
  Filled 2018-10-01: qty 46

## 2018-10-01 MED ORDER — IPRATROPIUM-ALBUTEROL 0.5-2.5 (3) MG/3ML IN SOLN
RESPIRATORY_TRACT | Status: AC
  Filled 2018-10-01: qty 3

## 2018-10-01 MED ORDER — IPRATROPIUM-ALBUTEROL 20-100 MCG/ACT IN AERS: 1.0000 | INHALATION_SPRAY | Freq: Four times a day (QID) | RESPIRATORY_TRACT | 4 refills | Status: DC

## 2018-10-01 MED ORDER — SODIUM CHLORIDE 0.9 % IV SOLN
1200.0000 mg | Freq: Once | INTRAVENOUS | Status: AC
  Administered 2018-10-01: 1200 mg via INTRAVENOUS
  Filled 2018-10-01: qty 20

## 2018-10-01 MED ORDER — SODIUM CHLORIDE 0.9 % IV SOLN
Freq: Once | INTRAVENOUS | Status: AC
  Administered 2018-10-01: 12:00:00 via INTRAVENOUS
  Filled 2018-10-01: qty 5

## 2018-10-01 MED FILL — LORazepam 0.5 MG TABS: 0.5 | 7 days supply | Qty: 30 | Fill #0

## 2018-10-01 MED FILL — LISINOPRIL 20 MG TABLET: 20 | 90 days supply | Qty: 90 | Fill #0

## 2018-10-01 MED FILL — AMLODIPINE BESYLATE 10 MG T: 10 | 90 days supply | Qty: 90 | Fill #0

## 2018-10-01 MED FILL — COMBIVENT RESPIMAT INHAL SP: 20-100 | 30 days supply | Qty: 4 | Fill #0

## 2018-10-01 NOTE — Telephone Encounter (Signed)
Return in about 3 weeks (around 10/22/2018) for md labs, 54min appt, chemo.  Check out comments: Pet scan on 03/23  Blood transfusion on 10/03/2018   Appointment for Blood has been added to current treatment / PET will be scheduled once Prior Auth has been obtained if needed Per 3/4 los

## 2018-10-01 NOTE — Patient Instructions (Signed)
Implanted Port Insertion, Care After  This sheet gives you information about how to care for yourself after your procedure. Your health care provider may also give you more specific instructions. If you have problems or questions, contact your health care provider.  What can I expect after the procedure?  After the procedure, it is common to have:  · Discomfort at the port insertion site.  · Bruising on the skin over the port. This should improve over 3-4 days.  Follow these instructions at home:  Port care  · After your port is placed, you will get a manufacturer's information card. The card has information about your port. Keep this card with you at all times.  · Take care of the port as told by your health care provider. Ask your health care provider if you or a family member can get training for taking care of the port at home. A home health care nurse may also take care of the port.  · Make sure to remember what type of port you have.  Incision care         · Follow instructions from your health care provider about how to take care of your port insertion site. Make sure you:  ? Wash your hands with soap and water before and after you change your bandage (dressing). If soap and water are not available, use hand sanitizer.  ? Change your dressing as told by your health care provider.  ? Leave stitches (sutures), skin glue, or adhesive strips in place. These skin closures may need to stay in place for 2 weeks or longer. If adhesive strip edges start to loosen and curl up, you may trim the loose edges. Do not remove adhesive strips completely unless your health care provider tells you to do that.  · Check your port insertion site every day for signs of infection. Check for:  ? Redness, swelling, or pain.  ? Fluid or blood.  ? Warmth.  ? Pus or a bad smell.  Activity  · Return to your normal activities as told by your health care provider. Ask your health care provider what activities are safe for you.  · Do not  lift anything that is heavier than 10 lb (4.5 kg), or the limit that you are told, until your health care provider says that it is safe.  General instructions  · Take over-the-counter and prescription medicines only as told by your health care provider.  · Do not take baths, swim, or use a hot tub until your health care provider approves. Ask your health care provider if you may take showers. You may only be allowed to take sponge baths.  · Do not drive for 24 hours if you were given a sedative during your procedure.  · Wear a medical alert bracelet in case of an emergency. This will tell any health care providers that you have a port.  · Keep all follow-up visits as told by your health care provider. This is important.  Contact a health care provider if:  · You cannot flush your port with saline as directed, or you cannot draw blood from the port.  · You have a fever or chills.  · You have redness, swelling, or pain around your port insertion site.  · You have fluid or blood coming from your port insertion site.  · Your port insertion site feels warm to the touch.  · You have pus or a bad smell coming from the port   insertion site.  Get help right away if:  · You have chest pain or shortness of breath.  · You have bleeding from your port that you cannot control.  Summary  · Take care of the port as told by your health care provider. Keep the manufacturer's information card with you at all times.  · Change your dressing as told by your health care provider.  · Contact a health care provider if you have a fever or chills or if you have redness, swelling, or pain around your port insertion site.  · Keep all follow-up visits as told by your health care provider.  This information is not intended to replace advice given to you by your health care provider. Make sure you discuss any questions you have with your health care provider.  Document Released: 05/06/2013 Document Revised: 02/11/2018 Document Reviewed:  02/11/2018  Elsevier Interactive Patient Education © 2019 [employer]

## 2018-10-01 NOTE — Patient Instructions (Signed)
[facility] Discharge Instructions for Patients Receiving Chemotherapy  Today you received the following chemotherapy agents Tecentriq, Carboplatin, VP16  To help prevent nausea and vomiting after your treatment, we encourage you to take your nausea medication    If you develop nausea and vomiting that is not controlled by your nausea medication, call the clinic.   BELOW ARE SYMPTOMS THAT SHOULD BE REPORTED IMMEDIATELY:  *FEVER GREATER THAN 100.5 F  *CHILLS WITH OR WITHOUT FEVER  NAUSEA AND VOMITING THAT IS NOT CONTROLLED WITH YOUR NAUSEA MEDICATION  *UNUSUAL SHORTNESS OF BREATH  *UNUSUAL BRUISING OR BLEEDING  TENDERNESS IN MOUTH AND THROAT WITH OR WITHOUT PRESENCE OF ULCERS  *URINARY PROBLEMS  *BOWEL PROBLEMS  UNUSUAL RASH Items with * indicate a potential emergency and should be followed up as soon as possible.  Feel free to call the clinic should you have any questions or concerns. The clinic phone number is [phone].  Please show the [facility] at check-in to the Emergency Department and triage nurse.

## 2018-10-01 NOTE — Progress Notes (Signed)
Labs reviewed.  Hgb 8.1 blood to be given Friday.  Appt made.  Proceed with treatment

## 2018-10-01 NOTE — Progress Notes (Signed)
Hematology and Oncology Follow Up Visit  Taylor Elliott [id number] [birth year] 68 y.o. 10/01/2018   Principle Diagnosis:   Extensive stage small cell lung cancer  SIADH secondary to small cell lung cancer  Current Therapy:    Carboplatinum/etoposide/Tecentriq-cycle #1       Interim History:  Taylor Elliott is back for follow-up.  He had a tough time with the first cycle of chemotherapy.  He was profoundly thrombocytopenic.  I am sure this is from the etoposide.  I decreased the dose of etoposide.  He is still having some problems with cough.  He may have some underlying lung disease.  I am sure that he probably does as he was a smoker.  I will see about giving him a Combivent inhaler.  I will set him up with a DuoNeb in the office.  He has finally had very nice healing of his hands.  He no longer is on demeclocycline.  His SIADH has resolved.  He will need to be transfused.  His hemoglobin is only 8.1.  I think the transfusion will help with his leg swelling.  I talked to he and his wife about the transfusion.  We will do the transfusion on Friday.  He will get 2 units of blood for this.  I will set this up.  His liver function studies have pretty much normalized.  His hepatomegaly has resolved.  As such, I think that he has responded nicely to treatment.  He has had little bit of constipation.  He is not bothered by this.  He has had no bleeding.  Overall, his performance status is ECOG 1.   Medications:  Current Outpatient Medications:  .  allopurinol (ZYLOPRIM) 100 MG tablet, Take 2 tablets (200 mg total) by mouth every morning. (Patient taking differently: Take 100 mg by mouth every morning. Pt taking 1 tablet daily), Disp: 180 tablet, Rfl: 3 .  amLODipine (NORVASC) 10 MG tablet, TAKE 1 TABLET BY MOUTH EVERY MORNING, Disp: 90 tablet, Rfl: 1 .  aspirin EC 81 MG tablet, Take 81 mg by mouth daily., Disp: , Rfl:  .  calcium carbonate (TUMS - DOSED IN MG ELEMENTAL CALCIUM) 500 MG  chewable tablet, Chew 2 tablets by mouth daily as needed for indigestion or heartburn. , Disp: , Rfl:  .  diphenhydrAMINE (SOMINEX) 25 MG tablet, Take 25 mg by mouth at bedtime as needed for sleep., Disp: , Rfl:  .  HYDROcodone-homatropine (HYCODAN) 5-1.5 MG/5ML syrup, Take 5 mLs by mouth every 4 (four) hours as needed for cough., Disp: 250 mL, Rfl: 0 .  lidocaine-prilocaine (EMLA) cream, Apply to affected area once, Disp: 30 g, Rfl: 3 .  lisinopril (PRINIVIL,ZESTRIL) 20 MG tablet, TAKE 1 TABLET BY MOUTH ONCE DAILY, Disp: 90 tablet, Rfl: 1 .  LORazepam (ATIVAN) 0.5 MG tablet, Take 1 tablet (0.5 mg total) by mouth every 6 (six) hours as needed (Nausea or vomiting). (Patient taking differently: Take 0.5 mg by mouth every 6 (six) hours as needed (Nausea or vomiting). Taking 1 tablet at HS), Disp: 30 tablet, Rfl: 0 .  Multiple Vitamin (MULTIVITAMIN) tablet, Take 1 tablet by mouth daily., Disp: , Rfl:  .  oxyCODONE (OXY IR/ROXICODONE) 5 MG immediate release tablet, Take 1 tablet (5 mg total) by mouth every 6 (six) hours as needed for severe pain., Disp: 60 tablet, Rfl: 0 .  solifenacin (VESICARE) 10 MG tablet, Take 10 mg by mouth every evening., Disp: , Rfl:  .  UNABLE TO FIND, Med Name: Allergy  shots., Disp: , Rfl:  .  amoxicillin-clavulanate (AUGMENTIN) 875-125 MG tablet, Take 1 tablet by mouth 2 (two) times daily., Disp: 14 tablet, Rfl: 0 .  EPINEPHrine 0.3 mg/0.3 mL IJ SOAJ injection, Inject 0.3 mg into the muscle once., Disp: , Rfl:  .  fenofibrate 160 MG tablet, Take 1 tablet (160 mg total) by mouth daily. (Patient not taking: Reported on 10/01/2018), Disp: 30 tablet, Rfl: 2 .  lidocaine (XYLOCAINE) 2 % solution, , Disp: , Rfl:  .  magic mouthwash w/lidocaine SOLN, Take 5 mLs by mouth 3 (three) times daily as needed for mouth pain. benadryl 525 mg, hydrocortisone 60 mg and nystatin 0.6 mg. (Patient not taking: Reported on 10/01/2018), Disp: 240 mL, Rfl: 6 .  ondansetron (ZOFRAN) 8 MG tablet, Take 1  tablet (8 mg total) by mouth 2 (two) times daily as needed for refractory nausea / vomiting. Start on day 3 after carboplatin chemo. (Patient not taking: Reported on 10/01/2018), Disp: 30 tablet, Rfl: 1 .  prochlorperazine (COMPAZINE) 10 MG tablet, Take 1 tablet (10 mg total) by mouth every 6 (six) hours as needed (Nausea or vomiting). (Patient not taking: Reported on 10/01/2018), Disp: 30 tablet, Rfl: 1 .  rosuvastatin (CRESTOR) 10 MG tablet, TAKE 1 TABLET BY MOUTH ONCE DAILY (Patient not taking: No sig reported), Disp: 90 tablet, Rfl: 1  Allergies:  Allergies  Allergen Reactions  . Bee Venom Anaphylaxis  . Clindamycin/Lincomycin Hives    Past Medical History, Surgical history, Social history, and Family History were reviewed and updated.  Review of Systems: Review of Systems  Constitutional: Negative.   HENT:  Negative.   Eyes: Negative.   Respiratory: Positive for cough.   Cardiovascular: Negative.   Gastrointestinal: Negative.   Endocrine: Negative.   Genitourinary: Negative.    Musculoskeletal: Positive for back pain, myalgias and neck pain.  Skin: Positive for itching and rash.  Neurological: Negative.   Hematological: Negative.   Psychiatric/Behavioral: Negative.     Physical Exam:  weight is 200 lb 1.9 oz (90.8 kg). His oral temperature is 98.7 F (37.1 C). His blood pressure is 155/82 (abnormal) and his pulse is 76. His respiration is 20 and oxygen saturation is 100%.   Wt Readings from Last 3 Encounters:  10/01/18 200 lb 1.9 oz (90.8 kg)  09/24/18 201 lb (91.2 kg)  09/22/18 192 lb (87.1 kg)    Physical Exam Vitals signs reviewed.  HENT:     Head: Normocephalic and atraumatic.  Eyes:     Pupils: Pupils are equal, round, and reactive to light.  Neck:     Musculoskeletal: Normal range of motion.  Cardiovascular:     Rate and Rhythm: Normal rate and regular rhythm.     Heart sounds: Normal heart sounds.  Pulmonary:     Effort: Pulmonary effort is normal.      Breath sounds: Normal breath sounds.  Abdominal:     General: Bowel sounds are normal.     Palpations: Abdomen is soft.     Comments: Abdominal exam shows a slightly obese abdomen.  He does have an umbilical hernia.  I really cannot palpate his liver at this point.  There is no fluid wave.  There is no inguinal adenopathy.  There is no splenomegaly.    Musculoskeletal: Normal range of motion.        General: No tenderness or deformity.  Lymphadenopathy:     Cervical: No cervical adenopathy.  Skin:    General: Skin is warm and dry.  Findings: No erythema or rash.  Neurological:     Mental Status: He is alert and oriented to person, place, and time.  Psychiatric:        Behavior: Behavior normal.        Thought Content: Thought content normal.        Judgment: Judgment normal.      Lab Results  Component Value Date   WBC 8.7 10/01/2018   HGB 8.1 (L) 10/01/2018   HCT 25.9 (L) 10/01/2018   MCV 98.5 10/01/2018   PLT 380 10/01/2018     Chemistry      Component Value Date/Time   NA 143 09/24/2018 0900   NA 135 (L) 07/12/2015 0813   K 3.9 09/24/2018 0900   K 4.5 07/12/2015 0813   CL 107 09/24/2018 0900   CO2 28 09/24/2018 0900   CO2 24 07/12/2015 0813   BUN 18 09/24/2018 0900   BUN 15.7 07/12/2015 0813   CREATININE 1.25 (H) 09/24/2018 0900   CREATININE 1.6 (H) 07/12/2015 0813      Component Value Date/Time   CALCIUM 8.5 (L) 09/24/2018 0900   CALCIUM 10.1 07/12/2015 0813   ALKPHOS 207 (H) 09/24/2018 0900   ALKPHOS 115 07/12/2015 0813   AST 20 09/24/2018 0900   AST 22 07/12/2015 0813   ALT 23 09/24/2018 0900   ALT 24 07/12/2015 0813   BILITOT 0.3 09/24/2018 0900   BILITOT 0.46 07/12/2015 0813       Impression and Plan: Mr. Garrelts is a 68 year old white male.  He has a past history of renal cell carcinoma and also prostate cancer.  Now, he has a third malignancy.  This is metastatic small cell lung cancer.  Again, I had believe that he has responded well to  treatment.  We will set him up with a PET scan.  We will do this on 23 March.  Given all the issues that he has, I had to spend 30 minutes with he and his wife this morning.  I had to spend all the time face-to-face.  I reviewed all of his lab work with him.  Had a set up his blood transfusions.  I do set up his DuoNeb.  I arrange for his follow-up appointments with [place].  I made an adjustment with his chemotherapy dosing.  We will have him come back in 3 weeks.    Volanda Napoleon, MD 3/4/202010:20 AM

## 2018-10-02 ENCOUNTER — Inpatient Hospital Stay: Payer: 59

## 2018-10-02 ENCOUNTER — Encounter: Payer: Self-pay | Admitting: Family Medicine

## 2018-10-02 VITALS — BP 155/69 | HR 104 | Temp 99.1°F | Resp 20

## 2018-10-02 DIAGNOSIS — C3491 Malignant neoplasm of unspecified part of right bronchus or lung: Secondary | ICD-10-CM | POA: Diagnosis not present

## 2018-10-02 DIAGNOSIS — D6481 Anemia due to antineoplastic chemotherapy: Secondary | ICD-10-CM | POA: Diagnosis not present

## 2018-10-02 DIAGNOSIS — Z5111 Encounter for antineoplastic chemotherapy: Secondary | ICD-10-CM | POA: Diagnosis not present

## 2018-10-02 DIAGNOSIS — C7951 Secondary malignant neoplasm of bone: Secondary | ICD-10-CM | POA: Diagnosis not present

## 2018-10-02 DIAGNOSIS — C787 Secondary malignant neoplasm of liver and intrahepatic bile duct: Secondary | ICD-10-CM | POA: Diagnosis not present

## 2018-10-02 DIAGNOSIS — Z5112 Encounter for antineoplastic immunotherapy: Secondary | ICD-10-CM | POA: Diagnosis not present

## 2018-10-02 LAB — IRON AND TIBC
Iron: 107 ug/dL (ref 42–163)
Saturation Ratios: 27 % (ref 20–55)
TIBC: 388 ug/dL (ref 202–409)
UIBC: 281 ug/dL (ref 117–376)

## 2018-10-02 LAB — T4: T4, Total: 8 ug/dL (ref 4.5–12.0)

## 2018-10-02 LAB — FERRITIN: Ferritin: 110 ng/mL (ref 24–336)

## 2018-10-02 LAB — TSH: TSH: 4.388 u[IU]/mL — ABNORMAL HIGH (ref 0.320–4.118)

## 2018-10-02 MED ORDER — SODIUM CHLORIDE 0.9% FLUSH
10.0000 mL | INTRAVENOUS | Status: DC | PRN
  Administered 2018-10-02: 10 mL
  Filled 2018-10-02: qty 10

## 2018-10-02 MED ORDER — DEXAMETHASONE SODIUM PHOSPHATE 10 MG/ML IJ SOLN
10.0000 mg | Freq: Once | INTRAMUSCULAR | Status: AC
  Administered 2018-10-02: 10 mg via INTRAVENOUS

## 2018-10-02 MED ORDER — DEXAMETHASONE SODIUM PHOSPHATE 10 MG/ML IJ SOLN
INTRAMUSCULAR | Status: AC
  Filled 2018-10-02: qty 1

## 2018-10-02 MED ORDER — SODIUM CHLORIDE 0.9 % IV SOLN
60.0000 mg/m2 | Freq: Once | INTRAVENOUS | Status: AC
  Administered 2018-10-02: 120 mg via INTRAVENOUS
  Filled 2018-10-02: qty 6

## 2018-10-02 MED ORDER — IPRATROPIUM-ALBUTEROL 0.5-2.5 (3) MG/3ML IN SOLN
3.0000 mL | Freq: Four times a day (QID) | RESPIRATORY_TRACT | Status: DC
  Administered 2018-10-02: 3 mL via RESPIRATORY_TRACT

## 2018-10-02 MED ORDER — SODIUM CHLORIDE 0.9 % IV SOLN
Freq: Once | INTRAVENOUS | Status: AC
  Administered 2018-10-02: 08:00:00 via INTRAVENOUS
  Filled 2018-10-02: qty 250

## 2018-10-02 MED ORDER — HEPARIN SOD (PORK) LOCK FLUSH 100 UNIT/ML IV SOLN
500.0000 [IU] | Freq: Once | INTRAVENOUS | Status: AC | PRN
  Administered 2018-10-02: 500 [IU]
  Filled 2018-10-02: qty 5

## 2018-10-02 NOTE — Patient Instructions (Signed)
Etoposide, VP-16 injection What is this medicine? ETOPOSIDE, VP-16 (e toe POE side) is a chemotherapy drug. It is used to treat testicular cancer, lung cancer, and other cancers. This medicine may be used for other purposes; ask your health care provider or pharmacist if you have questions. COMMON BRAND NAME(S): Etopophos, Toposar, VePesid What should I tell my health care provider before I take this medicine? They need to know if you have any of these conditions: -infection -kidney disease -liver disease -low blood counts, like low white cell, platelet, or red cell counts -an unusual or allergic reaction to etoposide, other medicines, foods, dyes, or preservatives -pregnant or trying to get pregnant -breast-feeding How should I use this medicine? This medicine is for infusion into a vein. It is administered in a hospital or clinic by a specially trained health care professional. Talk to your pediatrician regarding the use of this medicine in children. Special care may be needed. Overdosage: If you think you have taken too much of this medicine contact a poison control center or emergency room at once. NOTE: This medicine is only for you. Do not share this medicine with others. What if I miss a dose? It is important not to miss your dose. Call your doctor or health care professional if you are unable to keep an appointment. What may interact with this medicine? -aspirin -certain medications for seizures like carbamazepine, phenobarbital, phenytoin, valproic acid -cyclosporine -levamisole -warfarin This list may not describe all possible interactions. Give your health care provider a list of all the medicines, herbs, non-prescription drugs, or dietary supplements you use. Also tell them if you smoke, drink alcohol, or use illegal drugs. Some items may interact with your medicine. What should I watch for while using this medicine? Visit your doctor for checks on your progress. This drug  may make you feel generally unwell. This is not uncommon, as chemotherapy can affect healthy cells as well as cancer cells. Report any side effects. Continue your course of treatment even though you feel ill unless your doctor tells you to stop. In some cases, you may be given additional medicines to help with side effects. Follow all directions for their use. Call your doctor or health care professional for advice if you get a fever, chills or sore throat, or other symptoms of a cold or flu. Do not treat yourself. This drug decreases your body's ability to fight infections. Try to avoid being around people who are sick. This medicine may increase your risk to bruise or bleed. Call your doctor or health care professional if you notice any unusual bleeding. Talk to your doctor about your risk of cancer. You may be more at risk for certain types of cancers if you take this medicine. Do not become pregnant while taking this medicine or for at least 6 months after stopping it. Women should inform their doctor if they wish to become pregnant or think they might be pregnant. Women of child-bearing potential will need to have a negative pregnancy test before starting this medicine. There is a potential for serious side effects to an unborn child. Talk to your health care professional or pharmacist for more information. Do not breast-feed an infant while taking this medicine. Men must use a latex condom during sexual contact with a woman while taking this medicine and for at least 4 months after stopping it. A latex condom is needed even if you have had a vasectomy. Contact your doctor right away if your partner becomes pregnant. Do   not donate sperm while taking this medicine and for at least 4 months after you stop taking this medicine. Men should inform their doctors if they wish to father a child. This medicine may lower sperm counts. What side effects may I notice from receiving this medicine? Side effects that  you should report to your doctor or health care professional as soon as possible: -allergic reactions like skin rash, itching or hives, swelling of the face, lips, or tongue -low blood counts - this medicine may decrease the number of white blood cells, red blood cells and platelets. You may be at increased risk for infections and bleeding. -signs of infection - fever or chills, cough, sore throat, pain or difficulty passing urine -signs of decreased platelets or bleeding - bruising, pinpoint red spots on the skin, black, tarry stools, blood in the urine -signs of decreased red blood cells - unusually weak or tired, fainting spells, lightheadedness -breathing problems -changes in vision -mouth or throat sores or ulcers -pain, redness, swelling or irritation at the injection site -pain, tingling, numbness in the hands or feet -redness, blistering, peeling or loosening of the skin, including inside the mouth -seizures -vomiting Side effects that usually do not require medical attention (report to your doctor or health care professional if they continue or are bothersome): -diarrhea -hair loss -loss of appetite -nausea -stomach pain This list may not describe all possible side effects. Call your doctor for medical advice about side effects. You may report side effects to FDA at 1-800-FDA-1088. Where should I keep my medicine? This drug is given in a hospital or clinic and will not be stored at home. NOTE: This sheet is a summary. It may not cover all possible information. If you have questions about this medicine, talk to your doctor, pharmacist, or health care provider.  2019 Elsevier/Gold Standard (2015-07-08 11:53:23)  

## 2018-10-03 ENCOUNTER — Ambulatory Visit: Admit: 2018-10-03 | Payer: Medicare Other | Admitting: Orthopedic Surgery

## 2018-10-03 ENCOUNTER — Inpatient Hospital Stay: Payer: 59

## 2018-10-03 ENCOUNTER — Other Ambulatory Visit: Payer: Self-pay | Admitting: Family Medicine

## 2018-10-03 VITALS — BP 150/93 | HR 91 | Temp 98.6°F | Resp 20

## 2018-10-03 DIAGNOSIS — Z5112 Encounter for antineoplastic immunotherapy: Secondary | ICD-10-CM | POA: Diagnosis not present

## 2018-10-03 DIAGNOSIS — C7951 Secondary malignant neoplasm of bone: Secondary | ICD-10-CM | POA: Diagnosis not present

## 2018-10-03 DIAGNOSIS — C3491 Malignant neoplasm of unspecified part of right bronchus or lung: Secondary | ICD-10-CM | POA: Diagnosis not present

## 2018-10-03 DIAGNOSIS — D6481 Anemia due to antineoplastic chemotherapy: Secondary | ICD-10-CM | POA: Diagnosis not present

## 2018-10-03 DIAGNOSIS — Z5111 Encounter for antineoplastic chemotherapy: Secondary | ICD-10-CM | POA: Diagnosis not present

## 2018-10-03 DIAGNOSIS — E785 Hyperlipidemia, unspecified: Secondary | ICD-10-CM

## 2018-10-03 DIAGNOSIS — C787 Secondary malignant neoplasm of liver and intrahepatic bile duct: Secondary | ICD-10-CM | POA: Diagnosis not present

## 2018-10-03 SURGERY — ARTHROSCOPY, SHOULDER
Anesthesia: General | Laterality: Right

## 2018-10-03 MED ORDER — SODIUM CHLORIDE 0.9% IV SOLUTION
250.0000 mL | Freq: Once | INTRAVENOUS | Status: AC
  Filled 2018-10-03: qty 250

## 2018-10-03 MED ORDER — SODIUM CHLORIDE 0.9% FLUSH
10.0000 mL | INTRAVENOUS | Status: AC | PRN
  Administered 2018-10-03: 10 mL
  Filled 2018-10-03: qty 10

## 2018-10-03 MED ORDER — PEGFILGRASTIM 6 MG/0.6ML ~~LOC~~ PSKT
6.0000 mg | PREFILLED_SYRINGE | Freq: Once | SUBCUTANEOUS | Status: AC
  Administered 2018-10-03: 6 mg via SUBCUTANEOUS

## 2018-10-03 MED ORDER — DEXAMETHASONE SODIUM PHOSPHATE 10 MG/ML IJ SOLN
10.0000 mg | Freq: Once | INTRAMUSCULAR | Status: AC
  Administered 2018-10-03: 10 mg via INTRAVENOUS

## 2018-10-03 MED ORDER — FENOFIBRATE 160 MG PO TABS: 160.0000 mg | ORAL_TABLET | Freq: Every day | ORAL | 1 refills | Status: DC

## 2018-10-03 MED ORDER — SODIUM CHLORIDE 0.9 % IV SOLN
60.0000 mg/m2 | Freq: Once | INTRAVENOUS | Status: AC
  Administered 2018-10-03: 120 mg via INTRAVENOUS
  Filled 2018-10-03: qty 6

## 2018-10-03 MED ORDER — FUROSEMIDE 10 MG/ML IJ SOLN: 20.0000 mg | Freq: Once | INTRAMUSCULAR | Status: DC

## 2018-10-03 MED ORDER — HEPARIN SOD (PORK) LOCK FLUSH 100 UNIT/ML IV SOLN
500.0000 [IU] | Freq: Every day | INTRAVENOUS | Status: AC | PRN
  Administered 2018-10-03: 500 [IU]
  Filled 2018-10-03: qty 5

## 2018-10-03 MED ORDER — SODIUM CHLORIDE 0.9 % IV SOLN
Freq: Once | INTRAVENOUS | Status: AC
  Administered 2018-10-03: 08:00:00 via INTRAVENOUS
  Filled 2018-10-03: qty 250

## 2018-10-03 MED ORDER — DEXAMETHASONE SODIUM PHOSPHATE 10 MG/ML IJ SOLN
INTRAMUSCULAR | Status: AC
  Filled 2018-10-03: qty 1

## 2018-10-03 MED ORDER — FENOFIBRATE 160 MG PO TABS: 160.0000 mg | ORAL_TABLET | Freq: Every day | ORAL | 2 refills | Status: DC

## 2018-10-03 MED FILL — FENOFIBRATE 160 MG TABLET: 160 | 90 days supply | Qty: 90 | Fill #0

## 2018-10-03 NOTE — Telephone Encounter (Signed)
Wife notified that med was sent in.

## 2018-10-03 NOTE — Telephone Encounter (Signed)
Do you want him to continue on fenofibrate.  It has on his med list that he is not taking.

## 2018-10-03 NOTE — Telephone Encounter (Signed)
sent 

## 2018-10-03 NOTE — Patient Instructions (Addendum)
[facility] Discharge Instructions for Patients Receiving Chemotherapy  Today you received the following chemotherapy agents:  Etoposide  To help prevent nausea and vomiting after your treatment, we encourage you to take your nausea medication as ordered per MD.    If you develop nausea and vomiting that is not controlled by your nausea medication, call the clinic.   BELOW ARE SYMPTOMS THAT SHOULD BE REPORTED IMMEDIATELY:  *FEVER GREATER THAN 100.5 F  *CHILLS WITH OR WITHOUT FEVER  NAUSEA AND VOMITING THAT IS NOT CONTROLLED WITH YOUR NAUSEA MEDICATION  *UNUSUAL SHORTNESS OF BREATH  *UNUSUAL BRUISING OR BLEEDING  TENDERNESS IN MOUTH AND THROAT WITH OR WITHOUT PRESENCE OF ULCERS  *URINARY PROBLEMS  *BOWEL PROBLEMS  UNUSUAL RASH Items with * indicate a potential emergency and should be followed up as soon as possible.  Feel free to call the clinic should you have any questions or concerns. The clinic phone number is [phone].  Please show the [facility] at check-in to the Emergency Department and triage nurse.   Blood Transfusion, Adult  A blood transfusion is a procedure in which you receive donated blood, including plasma, platelets, and red blood cells, through an IV tube. You may need a blood transfusion because of illness, surgery, or injury. The blood may come from a donor. You may also be able to donate blood for yourself (autologous blood donation) before a surgery if you know that you might require a blood transfusion. The blood given in a transfusion is made up of different types of cells. You may receive:  Red blood cells. These carry oxygen to the cells in the body.  White blood cells. These help you fight infections.  Platelets. These help your blood to clot.  Plasma. This is the liquid part of your blood and it helps with fluid imbalances. If you have hemophilia or another clotting disorder, you may also receive other types of blood  products. Tell a health care provider about:  Any allergies you have.  All medicines you are taking, including vitamins, herbs, eye drops, creams, and over-the-counter medicines.  Any problems you or family members have had with anesthetic medicines.  Any blood disorders you have.  Any surgeries you have had.  Any medical conditions you have, including any recent fever or cold symptoms.  Whether you are pregnant or may be pregnant.  Any previous reactions you have had during a blood transfusion. What are the risks? Generally, this is a safe procedure. However, problems may occur, including:  Having an allergic reaction to something in the donated blood. Hives and itching may be symptoms of this type of reaction.  Fever. This may be a reaction to the white blood cells in the transfused blood. Nausea or chest pain may accompany a fever.  Iron overload. This can happen from having many transfusions.  Transfusion-related acute lung injury (TRALI). This is a rare reaction that causes lung damage. The cause is not known.TRALI can occur within hours of a transfusion or several days later.  Sudden (acute) or delayed hemolytic reactions. This happens if your blood does not match the cells in your transfusion. Your body's defense system (immune system) may try to attack the new cells. This complication is rare. The symptoms include fever, chills, nausea, and low back pain or chest pain.  Infection or disease transmission. This is rare. What happens before the procedure?  You will have a blood test to determine your blood type. This is necessary to know  what kind of blood your body will accept and to match it to the donor blood.  If you are going to have a planned surgery, you may be able to do an autologous blood donation. This may be done in case you need to have a transfusion.  If you have had an allergic reaction to a transfusion in the past, you may be given medicine to help prevent  a reaction. This medicine may be given to you by mouth or through an IV tube.  You will have your temperature, blood pressure, and pulse monitored before the transfusion.  Follow instructions from your health care provider about eating and drinking restrictions.  Ask your health care provider about: ? Changing or stopping your regular medicines. This is especially important if you are taking diabetes medicines or blood thinners. ? Taking medicines such as aspirin and ibuprofen. These medicines can thin your blood. Do not take these medicines before your procedure if your health care provider instructs you not to. What happens during the procedure?  An IV tube will be inserted into one of your veins.  The bag of donated blood will be attached to your IV tube. The blood will then enter through your vein.  Your temperature, blood pressure, and pulse will be monitored regularly during the transfusion. This monitoring is done to detect early signs of a transfusion reaction.  If you have any signs or symptoms of a reaction, your transfusion will be stopped and you may be given medicine.  When the transfusion is complete, your IV tube will be removed.  Pressure may be applied to the IV site for a few minutes.  A bandage (dressing) will be applied. The procedure may vary among health care providers and hospitals. What happens after the procedure?  Your temperature, blood pressure, heart rate, breathing rate, and blood oxygen level will be monitored often.  Your blood may be tested to see how you are responding to the transfusion.  You may be warmed with fluids or blankets to maintain a normal body temperature. Summary  A blood transfusion is a procedure in which you receive donated blood, including plasma, platelets, and red blood cells, through an IV tube.  Your temperature, blood pressure, and pulse will be monitored before, during, and after the transfusion.  Your blood may be tested  after the transfusion to see how your body has responded. This information is not intended to replace advice given to you by your health care provider. Make sure you discuss any questions you have with your health care provider. Document Released: 07/13/2000 Document Revised: 04/12/2016 Document Reviewed: 04/12/2016 Elsevier Interactive Patient Education  [employer].

## 2018-10-03 NOTE — Telephone Encounter (Signed)
Taylor Elliott, wife, came down to check to make sure we got the mychart message about the refill of fenofirate 160 mg. Please call Velva Harman at [phone] when prescription has been sent to the pharmacy downstairs.

## 2018-10-03 NOTE — Addendum Note (Signed)
Addended by: Kem Boroughs D on: 10/03/2018 01:43 PM   Modules accepted: Orders

## 2018-10-04 LAB — TYPE AND SCREEN
ABO/RH(D): A POS
Antibody Screen: NEGATIVE
Unit division: 0
Unit division: 0

## 2018-10-04 LAB — BPAM RBC
Blood Product Expiration Date: 202004012359
Blood Product Expiration Date: 202004012359
ISSUE DATE / TIME: 202003060726
ISSUE DATE / TIME: 202003060726
Unit Type and Rh: 6200
Unit Type and Rh: 6200

## 2018-10-06 ENCOUNTER — Other Ambulatory Visit: Payer: Self-pay

## 2018-10-08 ENCOUNTER — Encounter: Payer: Self-pay | Admitting: Hematology & Oncology

## 2018-10-13 DIAGNOSIS — T63451D Toxic effect of venom of hornets, accidental (unintentional), subsequent encounter: Secondary | ICD-10-CM | POA: Diagnosis not present

## 2018-10-13 DIAGNOSIS — T63461D Toxic effect of venom of wasps, accidental (unintentional), subsequent encounter: Secondary | ICD-10-CM | POA: Diagnosis not present

## 2018-10-15 ENCOUNTER — Encounter (HOSPITAL_COMMUNITY)
Admission: RE | Admit: 2018-10-15 | Discharge: 2018-10-15 | Disposition: A | Discharge status: Home / Self Care | Payer: 59 | Source: Ambulatory Visit | Attending: Hematology & Oncology | Admitting: Hematology & Oncology

## 2018-10-15 ENCOUNTER — Other Ambulatory Visit: Payer: Self-pay

## 2018-10-15 DIAGNOSIS — C3491 Malignant neoplasm of unspecified part of right bronchus or lung: Secondary | ICD-10-CM | POA: Insufficient documentation

## 2018-10-15 DIAGNOSIS — C3490 Malignant neoplasm of unspecified part of unspecified bronchus or lung: Secondary | ICD-10-CM | POA: Diagnosis not present

## 2018-10-15 LAB — GLUCOSE, CAPILLARY: Glucose-Capillary: 100 mg/dL — ABNORMAL HIGH (ref 70–99)

## 2018-10-15 MED ORDER — FLUDEOXYGLUCOSE F - 18 (FDG) INJECTION
9.6000 | Freq: Once | INTRAVENOUS | Status: AC | PRN
  Administered 2018-10-15: 9.6 via INTRAVENOUS

## 2018-10-16 ENCOUNTER — Encounter: Payer: Self-pay | Admitting: *Deleted

## 2018-10-16 NOTE — Progress Notes (Signed)
Results shared with patient  Notes recorded by Volanda Napoleon, MD on 10/16/2018 at 10:42 AM EDT Call - the PET scan shows a MARKED improvement in the cancer!!! Probably, 70% of the cancer is now gone!!!!

## 2018-10-20 ENCOUNTER — Ambulatory Visit (INDEPENDENT_AMBULATORY_CARE_PROVIDER_SITE_OTHER): Payer: 59 | Admitting: Orthopedic Surgery

## 2018-10-22 ENCOUNTER — Inpatient Hospital Stay (HOSPITAL_BASED_OUTPATIENT_CLINIC_OR_DEPARTMENT_OTHER): Payer: 59 | Admitting: Hematology & Oncology

## 2018-10-22 ENCOUNTER — Inpatient Hospital Stay: Payer: 59

## 2018-10-22 ENCOUNTER — Other Ambulatory Visit: Payer: Self-pay

## 2018-10-22 ENCOUNTER — Encounter: Payer: Self-pay | Admitting: *Deleted

## 2018-10-22 ENCOUNTER — Encounter: Payer: Self-pay | Admitting: Hematology & Oncology

## 2018-10-22 DIAGNOSIS — C787 Secondary malignant neoplasm of liver and intrahepatic bile duct: Secondary | ICD-10-CM

## 2018-10-22 DIAGNOSIS — C3491 Malignant neoplasm of unspecified part of right bronchus or lung: Secondary | ICD-10-CM

## 2018-10-22 DIAGNOSIS — Z5112 Encounter for antineoplastic immunotherapy: Secondary | ICD-10-CM | POA: Diagnosis not present

## 2018-10-22 DIAGNOSIS — Z8546 Personal history of malignant neoplasm of prostate: Secondary | ICD-10-CM

## 2018-10-22 DIAGNOSIS — Z85528 Personal history of other malignant neoplasm of kidney: Secondary | ICD-10-CM | POA: Diagnosis not present

## 2018-10-22 DIAGNOSIS — D6481 Anemia due to antineoplastic chemotherapy: Secondary | ICD-10-CM | POA: Diagnosis not present

## 2018-10-22 DIAGNOSIS — Z5111 Encounter for antineoplastic chemotherapy: Secondary | ICD-10-CM | POA: Diagnosis not present

## 2018-10-22 DIAGNOSIS — C7951 Secondary malignant neoplasm of bone: Secondary | ICD-10-CM

## 2018-10-22 LAB — CMP (CANCER CENTER ONLY)
ALT: 19 U/L (ref 0–44)
AST: 15 U/L (ref 15–41)
Albumin: 3.7 g/dL (ref 3.5–5.0)
Alkaline Phosphatase: 126 U/L (ref 38–126)
Anion gap: 8 (ref 5–15)
BUN: 29 mg/dL — ABNORMAL HIGH (ref 8–23)
CO2: 26 mmol/L (ref 22–32)
Calcium: 8.8 mg/dL — ABNORMAL LOW (ref 8.9–10.3)
Chloride: 104 mmol/L (ref 98–111)
Creatinine: 1.49 mg/dL — ABNORMAL HIGH (ref 0.61–1.24)
GFR, Est AFR Am: 56 mL/min — ABNORMAL LOW (ref >60)
GFR, Estimated: 48 mL/min — ABNORMAL LOW (ref >60)
Glucose, Bld: 136 mg/dL — ABNORMAL HIGH (ref 70–99)
Potassium: 5 mmol/L (ref 3.5–5.1)
Sodium: 138 mmol/L (ref 135–145)
Total Bilirubin: 0.3 mg/dL (ref 0.3–1.2)
Total Protein: 5.9 g/dL — ABNORMAL LOW (ref 6.5–8.1)

## 2018-10-22 LAB — CBC WITH DIFFERENTIAL (CANCER CENTER ONLY)
Abs Immature Granulocytes: 0.09 10*3/uL — ABNORMAL HIGH (ref 0.00–0.07)
Basophils Absolute: 0.1 10*3/uL (ref 0.0–0.1)
Basophils Relative: 1 %
Eosinophils Absolute: 0.1 10*3/uL (ref 0.0–0.5)
Eosinophils Relative: 2 %
HCT: 34.6 % — ABNORMAL LOW (ref 39.0–52.0)
Hemoglobin: 11.1 g/dL — ABNORMAL LOW (ref 13.0–17.0)
Immature Granulocytes: 1 %
Lymphocytes Relative: 8 %
Lymphs Abs: 0.5 10*3/uL — ABNORMAL LOW (ref 0.7–4.0)
MCH: 30.4 pg (ref 26.0–34.0)
MCHC: 32.1 g/dL (ref 30.0–36.0)
MCV: 94.8 fL (ref 80.0–100.0)
Monocytes Absolute: 0.6 10*3/uL (ref 0.1–1.0)
Monocytes Relative: 9 %
Neutro Abs: 5.1 10*3/uL (ref 1.7–7.7)
Neutrophils Relative %: 79 %
Platelet Count: 297 10*3/uL (ref 150–400)
RBC: 3.65 MIL/uL — ABNORMAL LOW (ref 4.22–5.81)
RDW: 17.9 % — ABNORMAL HIGH (ref 11.5–15.5)
WBC Count: 6.5 10*3/uL (ref 4.0–10.5)
nRBC: 0 % (ref 0.0–0.2)

## 2018-10-22 LAB — LACTATE DEHYDROGENASE: LDH: 165 U/L (ref 98–192)

## 2018-10-22 LAB — TSH: TSH: 4.413 u[IU]/mL — ABNORMAL HIGH (ref 0.320–4.118)

## 2018-10-22 MED ORDER — SODIUM CHLORIDE 0.9 % IV SOLN
1200.0000 mg | Freq: Once | INTRAVENOUS | Status: AC
  Administered 2018-10-22: 1200 mg via INTRAVENOUS
  Filled 2018-10-22: qty 20

## 2018-10-22 MED ORDER — HEPARIN SOD (PORK) LOCK FLUSH 100 UNIT/ML IV SOLN
500.0000 [IU] | Freq: Once | INTRAVENOUS | Status: AC | PRN
  Administered 2018-10-22: 500 [IU]
  Filled 2018-10-22: qty 5

## 2018-10-22 MED ORDER — OXYCODONE HCL 5 MG PO TABS: 5.0000 mg | ORAL_TABLET | Freq: Four times a day (QID) | ORAL | 0 refills | Status: DC | PRN

## 2018-10-22 MED ORDER — SODIUM CHLORIDE 0.9 % IV SOLN
Freq: Once | INTRAVENOUS | Status: AC
  Administered 2018-10-22: 09:00:00 via INTRAVENOUS
  Filled 2018-10-22: qty 5

## 2018-10-22 MED ORDER — SODIUM CHLORIDE 0.9 % IV SOLN
382.9500 mg | Freq: Once | INTRAVENOUS | Status: AC
  Administered 2018-10-22: 380 mg via INTRAVENOUS
  Filled 2018-10-22: qty 38

## 2018-10-22 MED ORDER — VITAMIN B-6 500 MG PO TABS: 500.0000 mg | ORAL_TABLET | Freq: Every day | ORAL | 4 refills | Status: DC

## 2018-10-22 MED ORDER — PALONOSETRON HCL INJECTION 0.25 MG/5ML
INTRAVENOUS | Status: AC
  Filled 2018-10-22: qty 5

## 2018-10-22 MED ORDER — LORAZEPAM 0.5 MG PO TABS: 0.5000 mg | ORAL_TABLET | Freq: Four times a day (QID) | ORAL | 0 refills | Status: DC | PRN

## 2018-10-22 MED ORDER — SODIUM CHLORIDE 0.9% FLUSH
10.0000 mL | INTRAVENOUS | Status: DC | PRN
  Administered 2018-10-22 (×2): 10 mL
  Filled 2018-10-22: qty 10

## 2018-10-22 MED ORDER — HOT PACK MISC ONCOLOGY
1.0000 | Freq: Once | Status: DC | PRN
  Filled 2018-10-22: qty 1

## 2018-10-22 MED ORDER — SODIUM CHLORIDE 0.9 % IV SOLN
Freq: Once | INTRAVENOUS | Status: AC
  Administered 2018-10-22: 09:00:00 via INTRAVENOUS
  Filled 2018-10-22: qty 250

## 2018-10-22 MED ORDER — SODIUM CHLORIDE 0.9 % IV SOLN
60.0000 mg/m2 | Freq: Once | INTRAVENOUS | Status: AC
  Administered 2018-10-22: 120 mg via INTRAVENOUS
  Filled 2018-10-22: qty 6

## 2018-10-22 MED ORDER — PALONOSETRON HCL INJECTION 0.25 MG/5ML
0.2500 mg | Freq: Once | INTRAVENOUS | Status: AC
  Administered 2018-10-22: 0.25 mg via INTRAVENOUS

## 2018-10-22 MED FILL — oxyCODONE HCL 5 MG TABS: 5 | 15 days supply | Qty: 60 | Fill #0

## 2018-10-22 MED FILL — LORazepam 0.5 MG TABS: 0.5 | 7 days supply | Qty: 30 | Fill #0

## 2018-10-22 MED FILL — VIT B-6 200 MG TABLET: 200 MG | 48 days supply | Qty: 120 | Fill #0

## 2018-10-22 NOTE — Patient Instructions (Signed)
Carboplatin injection What is this medicine? CARBOPLATIN (KAR boe pla tin) is a chemotherapy drug. It targets fast dividing cells, like cancer cells, and causes these cells to die. This medicine is used to treat ovarian cancer and many other cancers. This medicine may be used for other purposes; ask your health care provider or pharmacist if you have questions. COMMON BRAND NAME(S): Paraplatin What should I tell my health care provider before I take this medicine? They need to know if you have any of these conditions: -blood disorders -hearing problems -kidney disease -recent or ongoing radiation therapy -an unusual or allergic reaction to carboplatin, cisplatin, other chemotherapy, other medicines, foods, dyes, or preservatives -pregnant or trying to get pregnant -breast-feeding How should I use this medicine? This drug is usually given as an infusion into a vein. It is administered in a hospital or clinic by a specially trained health care professional. Talk to your pediatrician regarding the use of this medicine in children. Special care may be needed. Overdosage: If you think you have taken too much of this medicine contact a poison control center or emergency room at once. NOTE: This medicine is only for you. Do not share this medicine with others. What if I miss a dose? It is important not to miss a dose. Call your doctor or health care professional if you are unable to keep an appointment. What may interact with this medicine? -medicines for seizures -medicines to increase blood counts like filgrastim, pegfilgrastim, sargramostim -some antibiotics like amikacin, gentamicin, neomycin, streptomycin, tobramycin -vaccines Talk to your doctor or health care professional before taking any of these medicines: -acetaminophen -aspirin -ibuprofen -ketoprofen -naproxen This list may not describe all possible interactions. Give your health care provider a list of all the medicines, herbs,  non-prescription drugs, or dietary supplements you use. Also tell them if you smoke, drink alcohol, or use illegal drugs. Some items may interact with your medicine. What should I watch for while using this medicine? Your condition will be monitored carefully while you are receiving this medicine. You will need important blood work done while you are taking this medicine. This drug may make you feel generally unwell. This is not uncommon, as chemotherapy can affect healthy cells as well as cancer cells. Report any side effects. Continue your course of treatment even though you feel ill unless your doctor tells you to stop. In some cases, you may be given additional medicines to help with side effects. Follow all directions for their use. Call your doctor or health care professional for advice if you get a fever, chills or sore throat, or other symptoms of a cold or flu. Do not treat yourself. This drug decreases your body's ability to fight infections. Try to avoid being around people who are sick. This medicine may increase your risk to bruise or bleed. Call your doctor or health care professional if you notice any unusual bleeding. Be careful brushing and flossing your teeth or using a toothpick because you may get an infection or bleed more easily. If you have any dental work done, tell your dentist you are receiving this medicine. Avoid taking products that contain aspirin, acetaminophen, ibuprofen, naproxen, or ketoprofen unless instructed by your doctor. These medicines may hide a fever. Do not become pregnant while taking this medicine. Women should inform their doctor if they wish to become pregnant or think they might be pregnant. There is a potential for serious side effects to an unborn child. Talk to your health care professional or  pharmacist for more information. Do not breast-feed an infant while taking this medicine. What side effects may I notice from receiving this medicine? Side effects  that you should report to your doctor or health care professional as soon as possible: -allergic reactions like skin rash, itching or hives, swelling of the face, lips, or tongue -signs of infection - fever or chills, cough, sore throat, pain or difficulty passing urine -signs of decreased platelets or bleeding - bruising, pinpoint red spots on the skin, black, tarry stools, nosebleeds -signs of decreased red blood cells - unusually weak or tired, fainting spells, lightheadedness -breathing problems -changes in hearing -changes in vision -chest pain -high blood pressure -low blood counts - This drug may decrease the number of white blood cells, red blood cells and platelets. You may be at increased risk for infections and bleeding. -nausea and vomiting -pain, swelling, redness or irritation at the injection site -pain, tingling, numbness in the hands or feet -problems with balance, talking, walking -trouble passing urine or change in the amount of urine Side effects that usually do not require medical attention (report to your doctor or health care professional if they continue or are bothersome): -hair loss -loss of appetite -metallic taste in the mouth or changes in taste This list may not describe all possible side effects. Call your doctor for medical advice about side effects. You may report side effects to FDA at 1-800-FDA-1088. Where should I keep my medicine? This drug is given in a hospital or clinic and will not be stored at home. NOTE: This sheet is a summary. It may not cover all possible information. If you have questions about this medicine, talk to your doctor, pharmacist, or health care provider.  2019 Elsevier/Gold Standard (2007-10-21 14:38:05) Etoposide, VP-16 injection What is this medicine? ETOPOSIDE, VP-16 (e toe POE side) is a chemotherapy drug. It is used to treat testicular cancer, lung cancer, and other cancers. This medicine may be used for other purposes; ask  your health care provider or pharmacist if you have questions. COMMON BRAND NAME(S): Etopophos, Toposar, VePesid What should I tell my health care provider before I take this medicine? They need to know if you have any of these conditions: -infection -kidney disease -liver disease -low blood counts, like low white cell, platelet, or red cell counts -an unusual or allergic reaction to etoposide, other medicines, foods, dyes, or preservatives -pregnant or trying to get pregnant -breast-feeding How should I use this medicine? This medicine is for infusion into a vein. It is administered in a hospital or clinic by a specially trained health care professional. Talk to your pediatrician regarding the use of this medicine in children. Special care may be needed. Overdosage: If you think you have taken too much of this medicine contact a poison control center or emergency room at once. NOTE: This medicine is only for you. Do not share this medicine with others. What if I miss a dose? It is important not to miss your dose. Call your doctor or health care professional if you are unable to keep an appointment. What may interact with this medicine? -aspirin -certain medications for seizures like carbamazepine, phenobarbital, phenytoin, valproic acid -cyclosporine -levamisole -warfarin This list may not describe all possible interactions. Give your health care provider a list of all the medicines, herbs, non-prescription drugs, or dietary supplements you use. Also tell them if you smoke, drink alcohol, or use illegal drugs. Some items may interact with your medicine. What should I watch for while  using this medicine? Visit your doctor for checks on your progress. This drug may make you feel generally unwell. This is not uncommon, as chemotherapy can affect healthy cells as well as cancer cells. Report any side effects. Continue your course of treatment even though you feel ill unless your doctor tells  you to stop. In some cases, you may be given additional medicines to help with side effects. Follow all directions for their use. Call your doctor or health care professional for advice if you get a fever, chills or sore throat, or other symptoms of a cold or flu. Do not treat yourself. This drug decreases your body's ability to fight infections. Try to avoid being around people who are sick. This medicine may increase your risk to bruise or bleed. Call your doctor or health care professional if you notice any unusual bleeding. Talk to your doctor about your risk of cancer. You may be more at risk for certain types of cancers if you take this medicine. Do not become pregnant while taking this medicine or for at least 6 months after stopping it. Women should inform their doctor if they wish to become pregnant or think they might be pregnant. Women of child-bearing potential will need to have a negative pregnancy test before starting this medicine. There is a potential for serious side effects to an unborn child. Talk to your health care professional or pharmacist for more information. Do not breast-feed an infant while taking this medicine. Men must use a latex condom during sexual contact with a woman while taking this medicine and for at least 4 months after stopping it. A latex condom is needed even if you have had a vasectomy. Contact your doctor right away if your partner becomes pregnant. Do not donate sperm while taking this medicine and for at least 4 months after you stop taking this medicine. Men should inform their doctors if they wish to father a child. This medicine may lower sperm counts. What side effects may I notice from receiving this medicine? Side effects that you should report to your doctor or health care professional as soon as possible: -allergic reactions like skin rash, itching or hives, swelling of the face, lips, or tongue -low blood counts - this medicine may decrease the number  of white blood cells, red blood cells and platelets. You may be at increased risk for infections and bleeding. -signs of infection - fever or chills, cough, sore throat, pain or difficulty passing urine -signs of decreased platelets or bleeding - bruising, pinpoint red spots on the skin, black, tarry stools, blood in the urine -signs of decreased red blood cells - unusually weak or tired, fainting spells, lightheadedness -breathing problems -changes in vision -mouth or throat sores or ulcers -pain, redness, swelling or irritation at the injection site -pain, tingling, numbness in the hands or feet -redness, blistering, peeling or loosening of the skin, including inside the mouth -seizures -vomiting Side effects that usually do not require medical attention (report to your doctor or health care professional if they continue or are bothersome): -diarrhea -hair loss -loss of appetite -nausea -stomach pain This list may not describe all possible side effects. Call your doctor for medical advice about side effects. You may report side effects to FDA at 1-800-FDA-1088. Where should I keep my medicine? This drug is given in a hospital or clinic and will not be stored at home. NOTE: This sheet is a summary. It may not cover all possible information. If you have  questions about this medicine, talk to your doctor, pharmacist, or health care provider.  2019 [employer]/Gold Standard (2015-07-08 11:53:23) Atezolizumab injection What is this medicine? ATEZOLIZUMAB (a te zoe LIZ ue mab) is a monoclonal antibody. It is used to treat bladder cancer (urothelial cancer), non-small cell lung cancer, small cell lung cancer, and breast cancer. This medicine may be used for other purposes; ask your health care provider or pharmacist if you have questions. COMMON BRAND NAME(S): Tecentriq What should I tell my health care provider before I take this medicine? They need to know if you have any of these  conditions: -diabetes -immune system problems -infection -inflammatory bowel disease -liver disease -lung or breathing disease -lupus -nervous system problems like myasthenia gravis or Guillain-Barre syndrome -organ transplant -an unusual or allergic reaction to atezolizumab, other medicines, foods, dyes, or preservatives -pregnant or trying to get pregnant -breast-feeding How should I use this medicine? This medicine is for infusion into a vein. It is given by a health care professional in a hospital or clinic setting. A special MedGuide will be given to you before each treatment. Be sure to read this information carefully each time. Talk to your pediatrician regarding the use of this medicine in children. Special care may be needed. Overdosage: If you think you have taken too much of this medicine contact a poison control center or emergency room at once. NOTE: This medicine is only for you. Do not share this medicine with others. What if I miss a dose? It is important not to miss your dose. Call your doctor or health care professional if you are unable to keep an appointment. What may interact with this medicine? Interactions have not been studied. This list may not describe all possible interactions. Give your health care provider a list of all the medicines, herbs, non-prescription drugs, or dietary supplements you use. Also tell them if you smoke, drink alcohol, or use illegal drugs. Some items may interact with your medicine. What should I watch for while using this medicine? Your condition will be monitored carefully while you are receiving this medicine. You may need blood work done while you are taking this medicine. Do not become pregnant while taking this medicine or for at least 5 months after stopping it. Women should inform their doctor if they wish to become pregnant or think they might be pregnant. There is a potential for serious side effects to an unborn child. Talk to  your health care professional or pharmacist for more information. Do not breast-feed an infant while taking this medicine or for at least 5 months after the last dose. What side effects may I notice from receiving this medicine? Side effects that you should report to your doctor or health care professional as soon as possible: -allergic reactions like skin rash, itching or hives, swelling of the face, lips, or tongue -black, tarry stools -bloody or watery diarrhea -breathing problems -changes in vision -chest pain or chest tightness -chills -facial flushing -fever -headache -signs and symptoms of high blood sugar such as dizziness; dry mouth; dry skin; fruity breath; nausea; stomach pain; increased hunger or thirst; increased urination -signs and symptoms of liver injury like dark yellow or brown urine; general ill feeling or flu-like symptoms; light-colored stools; loss of appetite; nausea; right upper belly pain; unusually weak or tired; yellowing of the eyes or skin -stomach pain -trouble passing urine or change in the amount of urine Side effects that usually do not require medical attention (report to your doctor or  health care professional if they continue or are bothersome): -cough -diarrhea -joint pain -muscle pain -muscle weakness -tiredness -weight loss This list may not describe all possible side effects. Call your doctor for medical advice about side effects. You may report side effects to FDA at 1-800-FDA-1088. Where should I keep my medicine? This drug is given in a hospital or clinic and will not be stored at home. NOTE: This sheet is a summary. It may not cover all possible information. If you have questions about this medicine, talk to your doctor, pharmacist, or health care provider.  2019 Elsevier/Gold Standard (2017-10-18 09:33:38)

## 2018-10-22 NOTE — Progress Notes (Signed)
Hematology and Oncology Follow Up Visit  Taylor Elliott [id number] 04-02-1951 68 y.o. 10/22/2018   Principle Diagnosis:   Extensive stage small cell lung cancer  SIADH secondary to small cell lung cancer  Current Therapy:    Carboplatinum/etoposide/Tecentriq-cycle #2       Interim History:  Taylor Elliott is back for follow-up.  As expected, he has had a fantastic response to treatment.  We did a PET scan on him.  This was done on 10/15/2018.  Basically, 95% of his cancer is now gone.  His liver looks totally normal.  The activity in his right lung also has responded nicely.  I am just very grateful and thankful that he is responded so nicely.  We did have to transfuse him with his last treatment.  This made him feel a whole lot better.  He is having little more neuropathy.  I am not sure exactly why.  I suppose that the chemotherapy might be part of this.  I will go ahead and try him on some pyridoxine at 500 mg p.o. daily.  His appetite is doing well.  The rash on his hands has resolved.  He has had no fever.  He has had no bleeding.  There is been no change in bowel or bladder habits.  Currently, his performance status is ECOG 1.    Medications:  Current Outpatient Medications:  .  allopurinol (ZYLOPRIM) 100 MG tablet, Take 2 tablets (200 mg total) by mouth every morning. (Patient taking differently: Take 100 mg by mouth daily. Pt taking 1 tablet daily), Disp: 180 tablet, Rfl: 3 .  amLODipine (NORVASC) 10 MG tablet, TAKE 1 TABLET BY MOUTH EVERY MORNING, Disp: 90 tablet, Rfl: 1 .  aspirin EC 81 MG tablet, Take 81 mg by mouth daily., Disp: , Rfl:  .  calcium carbonate (TUMS - DOSED IN MG ELEMENTAL CALCIUM) 500 MG chewable tablet, Chew 2 tablets by mouth daily as needed for indigestion or heartburn. , Disp: , Rfl:  .  diphenhydrAMINE (SOMINEX) 25 MG tablet, Take 25 mg by mouth at bedtime as needed for sleep., Disp: , Rfl:  .  EPINEPHrine 0.3 mg/0.3 mL IJ SOAJ injection, Inject 0.3  mg into the muscle once., Disp: , Rfl:  .  fenofibrate 160 MG tablet, Take 1 tablet (160 mg total) by mouth daily., Disp: 90 tablet, Rfl: 1 .  HYDROcodone-homatropine (HYCODAN) 5-1.5 MG/5ML syrup, Take 5 mLs by mouth every 4 (four) hours as needed for cough., Disp: 250 mL, Rfl: 0 .  Ipratropium-Albuterol (COMBIVENT) 20-100 MCG/ACT AERS respimat, Inhale 1 puff into the lungs every 6 (six) hours., Disp: 2 Inhaler, Rfl: 4 .  lidocaine-prilocaine (EMLA) cream, Apply to affected area once, Disp: 30 g, Rfl: 3 .  lisinopril (PRINIVIL,ZESTRIL) 20 MG tablet, TAKE 1 TABLET BY MOUTH ONCE DAILY, Disp: 90 tablet, Rfl: 1 .  LORazepam (ATIVAN) 0.5 MG tablet, Take 1 tablet (0.5 mg total) by mouth every 6 (six) hours as needed (Nausea or vomiting)., Disp: 30 tablet, Rfl: 0 .  Multiple Vitamin (MULTIVITAMIN) tablet, Take 1 tablet by mouth daily., Disp: , Rfl:  .  oxyCODONE (OXY IR/ROXICODONE) 5 MG immediate release tablet, Take 1 tablet (5 mg total) by mouth every 6 (six) hours as needed for severe pain., Disp: 60 tablet, Rfl: 0 .  solifenacin (VESICARE) 10 MG tablet, Take 10 mg by mouth every evening., Disp: , Rfl:  .  UNABLE TO FIND, Med Name: Allergy shots., Disp: , Rfl:  .  amoxicillin-clavulanate (AUGMENTIN) 875-125  MG tablet, Take 1 tablet by mouth 2 (two) times daily., Disp: 14 tablet, Rfl: 0 .  lidocaine (XYLOCAINE) 2 % solution, , Disp: , Rfl:  .  magic mouthwash w/lidocaine SOLN, Take 5 mLs by mouth 3 (three) times daily as needed for mouth pain. benadryl 525 mg, hydrocortisone 60 mg and nystatin 0.6 mg. (Patient not taking: Reported on 10/01/2018), Disp: 240 mL, Rfl: 6 .  ondansetron (ZOFRAN) 8 MG tablet, Take 1 tablet (8 mg total) by mouth 2 (two) times daily as needed for refractory nausea / vomiting. Start on day 3 after carboplatin chemo. (Patient not taking: Reported on 10/01/2018), Disp: 30 tablet, Rfl: 1 .  prochlorperazine (COMPAZINE) 10 MG tablet, Take 1 tablet (10 mg total) by mouth every 6 (six)  hours as needed (Nausea or vomiting). (Patient not taking: Reported on 10/01/2018), Disp: 30 tablet, Rfl: 1 .  rosuvastatin (CRESTOR) 10 MG tablet, TAKE 1 TABLET BY MOUTH ONCE DAILY (Patient not taking: No sig reported), Disp: 90 tablet, Rfl: 1  Allergies:  Allergies  Allergen Reactions  . Bee Venom Anaphylaxis  . Clindamycin/Lincomycin Hives    Past Medical History, Surgical history, Social history, and Family History were reviewed and updated.  Review of Systems: Review of Systems  Constitutional: Negative.   HENT:  Negative.   Eyes: Negative.   Respiratory: Positive for cough.   Cardiovascular: Negative.   Gastrointestinal: Negative.   Endocrine: Negative.   Genitourinary: Negative.    Musculoskeletal: Positive for back pain, myalgias and neck pain.  Skin: Positive for itching and rash.  Neurological: Negative.   Hematological: Negative.   Psychiatric/Behavioral: Negative.     Physical Exam:  height is 5\' 8"  (1.727 m) and weight is 198 lb 1.9 oz (89.9 kg). His oral temperature is 98.7 F (37.1 C). His blood pressure is 110/55 (abnormal) and his pulse is 97. His respiration is 20 and oxygen saturation is 98%.   Wt Readings from Last 3 Encounters:  10/22/18 198 lb 1.9 oz (89.9 kg)  10/01/18 200 lb 1.9 oz (90.8 kg)  09/24/18 201 lb (91.2 kg)    Physical Exam Vitals signs reviewed.  HENT:     Head: Normocephalic and atraumatic.  Eyes:     Pupils: Pupils are equal, round, and reactive to light.  Neck:     Musculoskeletal: Normal range of motion.  Cardiovascular:     Rate and Rhythm: Normal rate and regular rhythm.     Heart sounds: Normal heart sounds.  Pulmonary:     Effort: Pulmonary effort is normal.     Breath sounds: Normal breath sounds.  Abdominal:     General: Bowel sounds are normal.     Palpations: Abdomen is soft.     Comments: Abdominal exam shows a slightly obese abdomen.  He does have an umbilical hernia.  I really cannot palpate his liver at this  point.  There is no fluid wave.  There is no inguinal adenopathy.  There is no splenomegaly.    Musculoskeletal: Normal range of motion.        General: No tenderness or deformity.  Lymphadenopathy:     Cervical: No cervical adenopathy.  Skin:    General: Skin is warm and dry.     Findings: No erythema or rash.  Neurological:     Mental Status: He is alert and oriented to person, place, and time.  Psychiatric:        Behavior: Behavior normal.        Thought Content:  Thought content normal.        Judgment: Judgment normal.      Lab Results  Component Value Date   WBC 8.7 10/01/2018   HGB 8.1 (L) 10/01/2018   HCT 25.9 (L) 10/01/2018   MCV 98.5 10/01/2018   PLT 380 10/01/2018     Chemistry      Component Value Date/Time   NA 140 10/01/2018 0929   NA 135 (L) 07/12/2015 0813   K 4.1 10/01/2018 0929   K 4.5 07/12/2015 0813   CL 106 10/01/2018 0929   CO2 27 10/01/2018 0929   CO2 24 07/12/2015 0813   BUN 16 10/01/2018 0929   BUN 15.7 07/12/2015 0813   CREATININE 1.15 10/01/2018 0929   CREATININE 1.6 (H) 07/12/2015 0813      Component Value Date/Time   CALCIUM 9.1 10/01/2018 0929   CALCIUM 10.1 07/12/2015 0813   ALKPHOS 172 (H) 10/01/2018 0929   ALKPHOS 115 07/12/2015 0813   AST 17 10/01/2018 0929   AST 22 07/12/2015 0813   ALT 15 10/01/2018 0929   ALT 24 07/12/2015 0813   BILITOT 0.4 10/01/2018 0929   BILITOT 0.46 07/12/2015 0813       Impression and Plan: Taylor Elliott is a 68 year old white male.  He has a past history of renal cell carcinoma and also prostate cancer.  Now, he has a third malignancy.  This is metastatic small cell lung cancer.  Again, I would not surprise of his response.  I think that this is clearly expected.  We will proceed with 2 more cycles of chemotherapy with immunotherapy.  We will then do another PET scan after the fourth cycle.  I spent about 35 minutes with him today.  His wife was on the cell phone.  She heard everything.  I showed  him the PET scan results.  I reviewed his lab work with him.  I counseled him as to how we are moving forward.  Arranged for his next treatments.  I had to approve his chemotherapy protocol.  Marland Kitchen    Volanda Napoleon, MD 3/25/20208:11 AM

## 2018-10-23 ENCOUNTER — Inpatient Hospital Stay: Payer: 59

## 2018-10-23 ENCOUNTER — Other Ambulatory Visit: Payer: Self-pay

## 2018-10-23 VITALS — BP 143/72 | HR 98 | Temp 98.4°F | Resp 18

## 2018-10-23 DIAGNOSIS — D6481 Anemia due to antineoplastic chemotherapy: Secondary | ICD-10-CM | POA: Diagnosis not present

## 2018-10-23 DIAGNOSIS — C3491 Malignant neoplasm of unspecified part of right bronchus or lung: Secondary | ICD-10-CM

## 2018-10-23 DIAGNOSIS — C787 Secondary malignant neoplasm of liver and intrahepatic bile duct: Secondary | ICD-10-CM | POA: Diagnosis not present

## 2018-10-23 DIAGNOSIS — Z5112 Encounter for antineoplastic immunotherapy: Secondary | ICD-10-CM | POA: Diagnosis not present

## 2018-10-23 DIAGNOSIS — C7951 Secondary malignant neoplasm of bone: Secondary | ICD-10-CM | POA: Diagnosis not present

## 2018-10-23 DIAGNOSIS — Z5111 Encounter for antineoplastic chemotherapy: Secondary | ICD-10-CM | POA: Diagnosis not present

## 2018-10-23 LAB — T4: T4, Total: 7.6 ug/dL (ref 4.5–12.0)

## 2018-10-23 MED ORDER — SODIUM CHLORIDE 0.9 % IV SOLN
60.0000 mg/m2 | Freq: Once | INTRAVENOUS | Status: AC
  Administered 2018-10-23: 120 mg via INTRAVENOUS
  Filled 2018-10-23: qty 6

## 2018-10-23 MED ORDER — SODIUM CHLORIDE 0.9% FLUSH
10.0000 mL | INTRAVENOUS | Status: DC | PRN
  Administered 2018-10-23: 10 mL
  Filled 2018-10-23: qty 10

## 2018-10-23 MED ORDER — DEXAMETHASONE SODIUM PHOSPHATE 10 MG/ML IJ SOLN
INTRAMUSCULAR | Status: AC
  Filled 2018-10-23: qty 1

## 2018-10-23 MED ORDER — HEPARIN SOD (PORK) LOCK FLUSH 100 UNIT/ML IV SOLN
500.0000 [IU] | Freq: Once | INTRAVENOUS | Status: AC | PRN
  Administered 2018-10-23: 500 [IU]
  Filled 2018-10-23: qty 5

## 2018-10-23 MED ORDER — SODIUM CHLORIDE 0.9 % IV SOLN
Freq: Once | INTRAVENOUS | Status: AC
  Administered 2018-10-23: 08:00:00 via INTRAVENOUS
  Filled 2018-10-23: qty 250

## 2018-10-23 MED ORDER — DEXAMETHASONE SODIUM PHOSPHATE 10 MG/ML IJ SOLN
10.0000 mg | Freq: Once | INTRAMUSCULAR | Status: AC
  Administered 2018-10-23: 10 mg via INTRAVENOUS

## 2018-10-23 NOTE — Patient Instructions (Signed)
[facility] Discharge Instructions for Patients Receiving Chemotherapy  Today you received the following chemotherapy agents VP 16 To help prevent nausea and vomiting after your treatment, we encourage you to take your nausea medication as prescribed.   If you develop nausea and vomiting that is not controlled by your nausea medication, call the clinic.   BELOW ARE SYMPTOMS THAT SHOULD BE REPORTED IMMEDIATELY:  *FEVER GREATER THAN 100.5 F  *CHILLS WITH OR WITHOUT FEVER  NAUSEA AND VOMITING THAT IS NOT CONTROLLED WITH YOUR NAUSEA MEDICATION  *UNUSUAL SHORTNESS OF BREATH  *UNUSUAL BRUISING OR BLEEDING  TENDERNESS IN MOUTH AND THROAT WITH OR WITHOUT PRESENCE OF ULCERS  *URINARY PROBLEMS  *BOWEL PROBLEMS  UNUSUAL RASH Items with * indicate a potential emergency and should be followed up as soon as possible.  Feel free to call the clinic should you have any questions or concerns. The clinic phone number is [phone].  Please show the [facility] at check-in to the Emergency Department and triage nurse.

## 2018-10-24 ENCOUNTER — Inpatient Hospital Stay: Payer: 59

## 2018-10-24 ENCOUNTER — Other Ambulatory Visit: Payer: Self-pay | Admitting: *Deleted

## 2018-10-24 ENCOUNTER — Other Ambulatory Visit: Payer: Self-pay

## 2018-10-24 VITALS — BP 135/59 | HR 90 | Temp 98.0°F | Resp 18

## 2018-10-24 DIAGNOSIS — C7951 Secondary malignant neoplasm of bone: Secondary | ICD-10-CM | POA: Diagnosis not present

## 2018-10-24 DIAGNOSIS — C787 Secondary malignant neoplasm of liver and intrahepatic bile duct: Secondary | ICD-10-CM | POA: Diagnosis not present

## 2018-10-24 DIAGNOSIS — Z5111 Encounter for antineoplastic chemotherapy: Secondary | ICD-10-CM | POA: Diagnosis not present

## 2018-10-24 DIAGNOSIS — Z5112 Encounter for antineoplastic immunotherapy: Secondary | ICD-10-CM | POA: Diagnosis not present

## 2018-10-24 DIAGNOSIS — C3491 Malignant neoplasm of unspecified part of right bronchus or lung: Secondary | ICD-10-CM

## 2018-10-24 DIAGNOSIS — D6481 Anemia due to antineoplastic chemotherapy: Secondary | ICD-10-CM | POA: Diagnosis not present

## 2018-10-24 MED ORDER — HYDROCODONE-HOMATROPINE 5-1.5 MG/5ML PO SYRP: 5.0000 mL | ORAL_SOLUTION | ORAL | 0 refills | Status: DC | PRN

## 2018-10-24 MED ORDER — SODIUM CHLORIDE 0.9 % IV SOLN
Freq: Once | INTRAVENOUS | Status: AC
  Administered 2018-10-24: 08:00:00 via INTRAVENOUS
  Filled 2018-10-24: qty 250

## 2018-10-24 MED ORDER — HEPARIN SOD (PORK) LOCK FLUSH 100 UNIT/ML IV SOLN
500.0000 [IU] | Freq: Once | INTRAVENOUS | Status: AC | PRN
  Administered 2018-10-24: 500 [IU]
  Filled 2018-10-24: qty 5

## 2018-10-24 MED ORDER — SODIUM CHLORIDE 0.9% FLUSH
10.0000 mL | INTRAVENOUS | Status: DC | PRN
  Administered 2018-10-24: 10 mL
  Filled 2018-10-24: qty 10

## 2018-10-24 MED ORDER — DEXAMETHASONE SODIUM PHOSPHATE 10 MG/ML IJ SOLN
INTRAMUSCULAR | Status: AC
  Filled 2018-10-24: qty 1

## 2018-10-24 MED ORDER — DEXAMETHASONE SODIUM PHOSPHATE 10 MG/ML IJ SOLN
10.0000 mg | Freq: Once | INTRAMUSCULAR | Status: AC
  Administered 2018-10-24: 10 mg via INTRAVENOUS

## 2018-10-24 MED ORDER — PEGFILGRASTIM 6 MG/0.6ML ~~LOC~~ PSKT
6.0000 mg | PREFILLED_SYRINGE | Freq: Once | SUBCUTANEOUS | Status: AC
  Administered 2018-10-24: 6 mg via SUBCUTANEOUS

## 2018-10-24 MED ORDER — SODIUM CHLORIDE 0.9 % IV SOLN
60.0000 mg/m2 | Freq: Once | INTRAVENOUS | Status: AC
  Administered 2018-10-24: 120 mg via INTRAVENOUS
  Filled 2018-10-24: qty 6

## 2018-10-24 MED ORDER — PEGFILGRASTIM 6 MG/0.6ML ~~LOC~~ PSKT
PREFILLED_SYRINGE | SUBCUTANEOUS | Status: AC
  Filled 2018-10-24: qty 0.6

## 2018-10-24 MED FILL — HYDROCODONE-HOMATROPINE SOL: 5-1.5 | 9 days supply | Qty: 250 | Fill #0

## 2018-10-24 NOTE — Patient Instructions (Signed)
Etoposide, VP-16 injection What is this medicine? ETOPOSIDE, VP-16 (e toe POE side) is a chemotherapy drug. It is used to treat testicular cancer, lung cancer, and other cancers. This medicine may be used for other purposes; ask your health care provider or pharmacist if you have questions. COMMON BRAND NAME(S): Etopophos, Toposar, VePesid What should I tell my health care provider before I take this medicine? They need to know if you have any of these conditions: -infection -kidney disease -liver disease -low blood counts, like low white cell, platelet, or red cell counts -an unusual or allergic reaction to etoposide, other medicines, foods, dyes, or preservatives -pregnant or trying to get pregnant -breast-feeding How should I use this medicine? This medicine is for infusion into a vein. It is administered in a hospital or clinic by a specially trained health care professional. Talk to your pediatrician regarding the use of this medicine in children. Special care may be needed. Overdosage: If you think you have taken too much of this medicine contact a poison control center or emergency room at once. NOTE: This medicine is only for you. Do not share this medicine with others. What if I miss a dose? It is important not to miss your dose. Call your doctor or health care professional if you are unable to keep an appointment. What may interact with this medicine? -aspirin -certain medications for seizures like carbamazepine, phenobarbital, phenytoin, valproic acid -cyclosporine -levamisole -warfarin This list may not describe all possible interactions. Give your health care provider a list of all the medicines, herbs, non-prescription drugs, or dietary supplements you use. Also tell them if you smoke, drink alcohol, or use illegal drugs. Some items may interact with your medicine. What should I watch for while using this medicine? Visit your doctor for checks on your progress. This drug  may make you feel generally unwell. This is not uncommon, as chemotherapy can affect healthy cells as well as cancer cells. Report any side effects. Continue your course of treatment even though you feel ill unless your doctor tells you to stop. In some cases, you may be given additional medicines to help with side effects. Follow all directions for their use. Call your doctor or health care professional for advice if you get a fever, chills or sore throat, or other symptoms of a cold or flu. Do not treat yourself. This drug decreases your body's ability to fight infections. Try to avoid being around people who are sick. This medicine may increase your risk to bruise or bleed. Call your doctor or health care professional if you notice any unusual bleeding. Talk to your doctor about your risk of cancer. You may be more at risk for certain types of cancers if you take this medicine. Do not become pregnant while taking this medicine or for at least 6 months after stopping it. Women should inform their doctor if they wish to become pregnant or think they might be pregnant. Women of child-bearing potential will need to have a negative pregnancy test before starting this medicine. There is a potential for serious side effects to an unborn child. Talk to your health care professional or pharmacist for more information. Do not breast-feed an infant while taking this medicine. Men must use a latex condom during sexual contact with a woman while taking this medicine and for at least 4 months after stopping it. A latex condom is needed even if you have had a vasectomy. Contact your doctor right away if your partner becomes pregnant. Do   not donate sperm while taking this medicine and for at least 4 months after you stop taking this medicine. Men should inform their doctors if they wish to father a child. This medicine may lower sperm counts. What side effects may I notice from receiving this medicine? Side effects that  you should report to your doctor or health care professional as soon as possible: -allergic reactions like skin rash, itching or hives, swelling of the face, lips, or tongue -low blood counts - this medicine may decrease the number of white blood cells, red blood cells and platelets. You may be at increased risk for infections and bleeding. -signs of infection - fever or chills, cough, sore throat, pain or difficulty passing urine -signs of decreased platelets or bleeding - bruising, pinpoint red spots on the skin, black, tarry stools, blood in the urine -signs of decreased red blood cells - unusually weak or tired, fainting spells, lightheadedness -breathing problems -changes in vision -mouth or throat sores or ulcers -pain, redness, swelling or irritation at the injection site -pain, tingling, numbness in the hands or feet -redness, blistering, peeling or loosening of the skin, including inside the mouth -seizures -vomiting Side effects that usually do not require medical attention (report to your doctor or health care professional if they continue or are bothersome): -diarrhea -hair loss -loss of appetite -nausea -stomach pain This list may not describe all possible side effects. Call your doctor for medical advice about side effects. You may report side effects to FDA at 1-800-FDA-1088. Where should I keep my medicine? This drug is given in a hospital or clinic and will not be stored at home. NOTE: This sheet is a summary. It may not cover all possible information. If you have questions about this medicine, talk to your doctor, pharmacist, or health care provider.  2019 Elsevier/Gold Standard (2015-07-08 11:53:23)  

## 2018-10-27 MED FILL — COMBIVENT RESPIMAT INHAL SP: 20-100 | 30 days supply | Qty: 4 | Fill #1

## 2018-11-02 ENCOUNTER — Encounter: Payer: Self-pay | Admitting: Hematology & Oncology

## 2018-11-03 DIAGNOSIS — Z760 Encounter for issue of repeat prescription: Secondary | ICD-10-CM | POA: Diagnosis not present

## 2018-11-06 ENCOUNTER — Encounter: Payer: 59 | Admitting: Family Medicine

## 2018-11-12 ENCOUNTER — Inpatient Hospital Stay: Payer: 59 | Attending: Hematology & Oncology | Admitting: Hematology & Oncology

## 2018-11-12 ENCOUNTER — Inpatient Hospital Stay: Payer: 59

## 2018-11-12 ENCOUNTER — Other Ambulatory Visit: Payer: Self-pay

## 2018-11-12 ENCOUNTER — Encounter: Payer: Self-pay | Admitting: Hematology & Oncology

## 2018-11-12 VITALS — BP 127/66 | HR 99 | Temp 98.4°F | Resp 18 | Wt 205.0 lb

## 2018-11-12 DIAGNOSIS — Z5189 Encounter for other specified aftercare: Secondary | ICD-10-CM | POA: Diagnosis not present

## 2018-11-12 DIAGNOSIS — Z8546 Personal history of malignant neoplasm of prostate: Secondary | ICD-10-CM

## 2018-11-12 DIAGNOSIS — Z85528 Personal history of other malignant neoplasm of kidney: Secondary | ICD-10-CM

## 2018-11-12 DIAGNOSIS — Z5112 Encounter for antineoplastic immunotherapy: Secondary | ICD-10-CM | POA: Insufficient documentation

## 2018-11-12 DIAGNOSIS — C3491 Malignant neoplasm of unspecified part of right bronchus or lung: Secondary | ICD-10-CM

## 2018-11-12 DIAGNOSIS — C787 Secondary malignant neoplasm of liver and intrahepatic bile duct: Secondary | ICD-10-CM | POA: Insufficient documentation

## 2018-11-12 DIAGNOSIS — C7951 Secondary malignant neoplasm of bone: Secondary | ICD-10-CM | POA: Diagnosis not present

## 2018-11-12 DIAGNOSIS — G629 Polyneuropathy, unspecified: Secondary | ICD-10-CM

## 2018-11-12 DIAGNOSIS — Z5111 Encounter for antineoplastic chemotherapy: Secondary | ICD-10-CM | POA: Diagnosis not present

## 2018-11-12 LAB — CMP (CANCER CENTER ONLY)
ALT: 23 U/L (ref 0–44)
AST: 16 U/L (ref 15–41)
Albumin: 4 g/dL (ref 3.5–5.0)
Alkaline Phosphatase: 93 U/L (ref 38–126)
Anion gap: 5 (ref 5–15)
BUN: 30 mg/dL — ABNORMAL HIGH (ref 8–23)
CO2: 26 mmol/L (ref 22–32)
Calcium: 9.8 mg/dL (ref 8.9–10.3)
Chloride: 103 mmol/L (ref 98–111)
Creatinine: 1.36 mg/dL — ABNORMAL HIGH (ref 0.61–1.24)
GFR, Est AFR Am: >60 mL/min (ref >60)
GFR, Estimated: 54 mL/min — ABNORMAL LOW (ref >60)
Glucose, Bld: 108 mg/dL — ABNORMAL HIGH (ref 70–99)
Potassium: 4.8 mmol/L (ref 3.5–5.1)
Sodium: 134 mmol/L — ABNORMAL LOW (ref 135–145)
Total Bilirubin: 0.3 mg/dL (ref 0.3–1.2)
Total Protein: 6.3 g/dL — ABNORMAL LOW (ref 6.5–8.1)

## 2018-11-12 LAB — SAMPLE TO BLOOD BANK

## 2018-11-12 LAB — CBC WITH DIFFERENTIAL (CANCER CENTER ONLY)
Abs Immature Granulocytes: 0.08 10*3/uL — ABNORMAL HIGH (ref 0.00–0.07)
Basophils Absolute: 0 10*3/uL (ref 0.0–0.1)
Basophils Relative: 1 %
Eosinophils Absolute: 0.1 10*3/uL (ref 0.0–0.5)
Eosinophils Relative: 2 %
HCT: 33.7 % — ABNORMAL LOW (ref 39.0–52.0)
Hemoglobin: 11.1 g/dL — ABNORMAL LOW (ref 13.0–17.0)
Immature Granulocytes: 1 %
Lymphocytes Relative: 8 %
Lymphs Abs: 0.6 10*3/uL — ABNORMAL LOW (ref 0.7–4.0)
MCH: 31.3 pg (ref 26.0–34.0)
MCHC: 32.9 g/dL (ref 30.0–36.0)
MCV: 94.9 fL (ref 80.0–100.0)
Monocytes Absolute: 0.7 10*3/uL (ref 0.1–1.0)
Monocytes Relative: 10 %
Neutro Abs: 5.4 10*3/uL (ref 1.7–7.7)
Neutrophils Relative %: 78 %
Platelet Count: 257 10*3/uL (ref 150–400)
RBC: 3.55 MIL/uL — ABNORMAL LOW (ref 4.22–5.81)
RDW: 18.4 % — ABNORMAL HIGH (ref 11.5–15.5)
WBC Count: 6.8 10*3/uL (ref 4.0–10.5)
nRBC: 0 % (ref 0.0–0.2)

## 2018-11-12 LAB — LACTATE DEHYDROGENASE: LDH: 153 U/L (ref 98–192)

## 2018-11-12 MED ORDER — HEPARIN SOD (PORK) LOCK FLUSH 100 UNIT/ML IV SOLN
500.0000 [IU] | Freq: Once | INTRAVENOUS | Status: AC | PRN
  Administered 2018-11-12: 500 [IU]
  Filled 2018-11-12: qty 5

## 2018-11-12 MED ORDER — SODIUM CHLORIDE 0.9 % IV SOLN
Freq: Once | INTRAVENOUS | Status: AC
  Administered 2018-11-12: 12:00:00 via INTRAVENOUS
  Filled 2018-11-12: qty 5

## 2018-11-12 MED ORDER — SODIUM CHLORIDE 0.9 % IV SOLN
408.6000 mg | Freq: Once | INTRAVENOUS | Status: AC
  Administered 2018-11-12: 410 mg via INTRAVENOUS
  Filled 2018-11-12: qty 41

## 2018-11-12 MED ORDER — SODIUM CHLORIDE 0.9 % IV SOLN
1200.0000 mg | Freq: Once | INTRAVENOUS | Status: AC
  Administered 2018-11-12: 1200 mg via INTRAVENOUS
  Filled 2018-11-12: qty 20

## 2018-11-12 MED ORDER — PALONOSETRON HCL INJECTION 0.25 MG/5ML
INTRAVENOUS | Status: AC
  Filled 2018-11-12: qty 5

## 2018-11-12 MED ORDER — SODIUM CHLORIDE 0.9% FLUSH
10.0000 mL | INTRAVENOUS | Status: DC | PRN
  Administered 2018-11-12: 10 mL
  Filled 2018-11-12: qty 10

## 2018-11-12 MED ORDER — SODIUM CHLORIDE 0.9 % IV SOLN
INTRAVENOUS | Status: DC
  Administered 2018-11-12: 12:00:00 via INTRAVENOUS
  Filled 2018-11-12: qty 250

## 2018-11-12 MED ORDER — SODIUM CHLORIDE 0.9 % IV SOLN
60.0000 mg/m2 | Freq: Once | INTRAVENOUS | Status: AC
  Administered 2018-11-12: 120 mg via INTRAVENOUS
  Filled 2018-11-12: qty 6

## 2018-11-12 MED ORDER — PALONOSETRON HCL INJECTION 0.25 MG/5ML
0.2500 mg | Freq: Once | INTRAVENOUS | Status: AC
  Administered 2018-11-12: 0.25 mg via INTRAVENOUS

## 2018-11-12 NOTE — Patient Instructions (Signed)
[facility] Discharge Instructions for Patients Receiving Chemotherapy  Today you received the following chemotherapy agents Tecentriq, carboplatin, Vepesid.  To help prevent nausea and vomiting after your treatment, we encourage you to take your nausea medication as indicated by your MD.   If you develop nausea and vomiting that is not controlled by your nausea medication, call the clinic.   BELOW ARE SYMPTOMS THAT SHOULD BE REPORTED IMMEDIATELY:  *FEVER GREATER THAN 100.5 F  *CHILLS WITH OR WITHOUT FEVER  NAUSEA AND VOMITING THAT IS NOT CONTROLLED WITH YOUR NAUSEA MEDICATION  *UNUSUAL SHORTNESS OF BREATH  *UNUSUAL BRUISING OR BLEEDING  TENDERNESS IN MOUTH AND THROAT WITH OR WITHOUT PRESENCE OF ULCERS  *URINARY PROBLEMS  *BOWEL PROBLEMS  UNUSUAL RASH Items with * indicate a potential emergency and should be followed up as soon as possible.  Feel free to call the clinic should you have any questions or concerns. The clinic phone number is [phone].  Please show the [facility] at check-in to the Emergency Department and triage nurse.

## 2018-11-12 NOTE — Progress Notes (Signed)
Hematology and Oncology Follow Up Visit  CHRISTORPHER HISAW [id number] 06-Oct-1950 68 y.o. 11/12/2018   Principle Diagnosis:   Extensive stage small cell lung cancer  SIADH secondary to small cell lung cancer  Current Therapy:    Carboplatinum/etoposide/Tecentriq-cycle #3       Interim History:  Mr. Bradburn is back for follow-up.  He is looking very good.  He is doing better.  He has had no problems with the last cycle of chemotherapy.  His skin has healed up quite nicely.  He has had no diarrhea.  He has had no cough.  He is on some inhalers and I think nebulizer.  This is helped quite a bit.  He has had no bleeding.  He has had no fever.  He has had no nausea or vomiting.  His appetite has been doing pretty well.  There is been no leg swelling.  He does have neuropathy in his legs which is about the same.  Overall, his performance status is ECOG 1.    Medications:  Current Outpatient Medications:  .  allopurinol (ZYLOPRIM) 100 MG tablet, Take 2 tablets (200 mg total) by mouth every morning. (Patient taking differently: Take 100 mg by mouth daily. Pt taking 1 tablet daily), Disp: 180 tablet, Rfl: 3 .  amLODipine (NORVASC) 10 MG tablet, TAKE 1 TABLET BY MOUTH EVERY MORNING, Disp: 90 tablet, Rfl: 1 .  aspirin EC 81 MG tablet, Take 81 mg by mouth daily., Disp: , Rfl:  .  calcium carbonate (TUMS - DOSED IN MG ELEMENTAL CALCIUM) 500 MG chewable tablet, Chew 2 tablets by mouth daily as needed for indigestion or heartburn. , Disp: , Rfl:  .  diphenhydrAMINE (SOMINEX) 25 MG tablet, Take 25 mg by mouth at bedtime as needed for sleep., Disp: , Rfl:  .  fenofibrate 160 MG tablet, Take 1 tablet (160 mg total) by mouth daily., Disp: 90 tablet, Rfl: 1 .  HYDROcodone-homatropine (HYCODAN) 5-1.5 MG/5ML syrup, Take 5 mLs by mouth every 4 (four) hours as needed for cough., Disp: 250 mL, Rfl: 0 .  Ipratropium-Albuterol (COMBIVENT) 20-100 MCG/ACT AERS respimat, Inhale 1 puff into the lungs every 6 (six)  hours., Disp: 2 Inhaler, Rfl: 4 .  lidocaine-prilocaine (EMLA) cream, Apply to affected area once, Disp: 30 g, Rfl: 3 .  lisinopril (PRINIVIL,ZESTRIL) 20 MG tablet, TAKE 1 TABLET BY MOUTH ONCE DAILY, Disp: 90 tablet, Rfl: 1 .  LORazepam (ATIVAN) 0.5 MG tablet, Take 1 tablet (0.5 mg total) by mouth every 6 (six) hours as needed (Nausea or vomiting)., Disp: 30 tablet, Rfl: 0 .  Multiple Vitamin (MULTIVITAMIN) tablet, Take 1 tablet by mouth daily., Disp: , Rfl:  .  ondansetron (ZOFRAN) 8 MG tablet, Take 1 tablet (8 mg total) by mouth 2 (two) times daily as needed for refractory nausea / vomiting. Start on day 3 after carboplatin chemo., Disp: 30 tablet, Rfl: 1 .  oxyCODONE (OXY IR/ROXICODONE) 5 MG immediate release tablet, Take 1 tablet (5 mg total) by mouth every 6 (six) hours as needed for severe pain., Disp: 60 tablet, Rfl: 0 .  Pyridoxine HCl (VITAMIN B-6) 500 MG tablet, Take 1 tablet (500 mg total) by mouth daily., Disp: 60 tablet, Rfl: 4 .  UNABLE TO FIND, Med Name: Allergy shots., Disp: , Rfl:  .  amoxicillin-clavulanate (AUGMENTIN) 875-125 MG tablet, Take 1 tablet by mouth 2 (two) times daily., Disp: 14 tablet, Rfl: 0 .  EPINEPHrine 0.3 mg/0.3 mL IJ SOAJ injection, Inject 0.3 mg into the muscle once.,  Disp: , Rfl:  .  magic mouthwash w/lidocaine SOLN, Take 5 mLs by mouth 3 (three) times daily as needed for mouth pain. benadryl 525 mg, hydrocortisone 60 mg and nystatin 0.6 mg. (Patient not taking: Reported on 10/01/2018), Disp: 240 mL, Rfl: 6 .  prochlorperazine (COMPAZINE) 10 MG tablet, Take 1 tablet (10 mg total) by mouth every 6 (six) hours as needed (Nausea or vomiting). (Patient not taking: Reported on 10/01/2018), Disp: 30 tablet, Rfl: 1 .  rosuvastatin (CRESTOR) 10 MG tablet, TAKE 1 TABLET BY MOUTH ONCE DAILY (Patient not taking: No sig reported), Disp: 90 tablet, Rfl: 1 .  solifenacin (VESICARE) 10 MG tablet, Take 10 mg by mouth every evening., Disp: , Rfl:  No current facility-administered  medications for this visit.   Facility-Administered Medications Ordered in Other Visits:  .  0.9 %  sodium chloride infusion, , Intravenous, Continuous, Forest Redwine, Rudell Cobb, MD, Stopped at 11/12/18 1553 .  sodium chloride flush (NS) 0.9 % injection 10 mL, 10 mL, Intracatheter, PRN, Volanda Napoleon, MD, 10 mL at 11/12/18 1556  Allergies:  Allergies  Allergen Reactions  . Bee Venom Anaphylaxis  . Clindamycin/Lincomycin Hives    Past Medical History, Surgical history, Social history, and Family History were reviewed and updated.  Review of Systems: Review of Systems  Constitutional: Negative.   HENT:  Negative.   Eyes: Negative.   Respiratory: Positive for cough.   Cardiovascular: Negative.   Gastrointestinal: Negative.   Endocrine: Negative.   Genitourinary: Negative.    Musculoskeletal: Positive for back pain, myalgias and neck pain.  Skin: Positive for itching and rash.  Neurological: Negative.   Hematological: Negative.   Psychiatric/Behavioral: Negative.     Physical Exam:  weight is 205 lb (93 kg). His oral temperature is 98.4 F (36.9 C). His blood pressure is 127/66 and his pulse is 99. His respiration is 18 and oxygen saturation is 100%.   Wt Readings from Last 3 Encounters:  11/12/18 205 lb (93 kg)  10/22/18 198 lb 1.9 oz (89.9 kg)  10/01/18 200 lb 1.9 oz (90.8 kg)    Physical Exam Vitals signs reviewed.  HENT:     Head: Normocephalic and atraumatic.  Eyes:     Pupils: Pupils are equal, round, and reactive to light.  Neck:     Musculoskeletal: Normal range of motion.  Cardiovascular:     Rate and Rhythm: Normal rate and regular rhythm.     Heart sounds: Normal heart sounds.  Pulmonary:     Effort: Pulmonary effort is normal.     Breath sounds: Normal breath sounds.  Abdominal:     General: Bowel sounds are normal.     Palpations: Abdomen is soft.     Comments: Abdominal exam shows a slightly obese abdomen.  He does have an umbilical hernia.  I really  cannot palpate his liver at this point.  There is no fluid wave.  There is no inguinal adenopathy.  There is no splenomegaly.    Musculoskeletal: Normal range of motion.        General: No tenderness or deformity.  Lymphadenopathy:     Cervical: No cervical adenopathy.  Skin:    General: Skin is warm and dry.     Findings: No erythema or rash.  Neurological:     Mental Status: He is alert and oriented to person, place, and time.  Psychiatric:        Behavior: Behavior normal.        Thought Content: Thought  content normal.        Judgment: Judgment normal.      Lab Results  Component Value Date   WBC 6.8 11/12/2018   HGB 11.1 (L) 11/12/2018   HCT 33.7 (L) 11/12/2018   MCV 94.9 11/12/2018   PLT 257 11/12/2018     Chemistry      Component Value Date/Time   NA 134 (L) 11/12/2018 0935   NA 135 (L) 07/12/2015 0813   K 4.8 11/12/2018 0935   K 4.5 07/12/2015 0813   CL 103 11/12/2018 0935   CO2 26 11/12/2018 0935   CO2 24 07/12/2015 0813   BUN 30 (H) 11/12/2018 0935   BUN 15.7 07/12/2015 0813   CREATININE 1.36 (H) 11/12/2018 0935   CREATININE 1.6 (H) 07/12/2015 0813      Component Value Date/Time   CALCIUM 9.8 11/12/2018 0935   CALCIUM 10.1 07/12/2015 0813   ALKPHOS 93 11/12/2018 0935   ALKPHOS 115 07/12/2015 0813   AST 16 11/12/2018 0935   AST 22 07/12/2015 0813   ALT 23 11/12/2018 0935   ALT 24 07/12/2015 0813   BILITOT 0.3 11/12/2018 0935   BILITOT 0.46 07/12/2015 0813       Impression and Plan: Mr. Rumple is a 68 year old white male.  He has a past history of renal cell carcinoma and also prostate cancer.  Now, he has a third malignancy.  This is metastatic small cell lung cancer.  I have to believe that he is still responding.  His LDH is coming down.  His liver function studies are normal.  His quality of life has improved dramatically.  I will give him his fourth cycle of chemotherapy today.  I will then set him up with another PET scan.  I think this  will be critical.  I think that since he is tolerated treatment well and that treatment has worked nicely, we probably should try to treat him with 6 cycles of chemotherapy before we go with maintenance immunotherapy.  I think this would be reasonable and would help maintain a longer remission for him.  We will get the PET scan in about 2 weeks or so.  I will see him back in 3 weeks.      Volanda Napoleon, MD 4/15/20204:57 PM

## 2018-11-13 ENCOUNTER — Inpatient Hospital Stay: Payer: 59

## 2018-11-13 ENCOUNTER — Other Ambulatory Visit: Payer: Self-pay

## 2018-11-13 VITALS — BP 162/78 | HR 96 | Temp 98.7°F | Resp 17

## 2018-11-13 DIAGNOSIS — C7951 Secondary malignant neoplasm of bone: Secondary | ICD-10-CM | POA: Diagnosis not present

## 2018-11-13 DIAGNOSIS — Z5112 Encounter for antineoplastic immunotherapy: Secondary | ICD-10-CM | POA: Diagnosis not present

## 2018-11-13 DIAGNOSIS — C3491 Malignant neoplasm of unspecified part of right bronchus or lung: Secondary | ICD-10-CM | POA: Diagnosis not present

## 2018-11-13 DIAGNOSIS — Z5189 Encounter for other specified aftercare: Secondary | ICD-10-CM | POA: Diagnosis not present

## 2018-11-13 DIAGNOSIS — C787 Secondary malignant neoplasm of liver and intrahepatic bile duct: Secondary | ICD-10-CM | POA: Diagnosis not present

## 2018-11-13 DIAGNOSIS — Z5111 Encounter for antineoplastic chemotherapy: Secondary | ICD-10-CM | POA: Diagnosis not present

## 2018-11-13 MED ORDER — DEXAMETHASONE SODIUM PHOSPHATE 10 MG/ML IJ SOLN
10.0000 mg | Freq: Once | INTRAMUSCULAR | Status: AC
  Administered 2018-11-13: 10 mg via INTRAVENOUS

## 2018-11-13 MED ORDER — HYDROCODONE-HOMATROPINE 5-1.5 MG/5ML PO SYRP: 5.0000 mL | ORAL_SOLUTION | ORAL | 0 refills | Status: DC | PRN

## 2018-11-13 MED ORDER — SODIUM CHLORIDE 0.9% FLUSH
10.0000 mL | INTRAVENOUS | Status: DC | PRN
  Administered 2018-11-13: 10 mL
  Filled 2018-11-13: qty 10

## 2018-11-13 MED ORDER — SODIUM CHLORIDE 0.9 % IV SOLN
60.0000 mg/m2 | Freq: Once | INTRAVENOUS | Status: AC
  Administered 2018-11-13: 120 mg via INTRAVENOUS
  Filled 2018-11-13: qty 6

## 2018-11-13 MED ORDER — SODIUM CHLORIDE 0.9 % IV SOLN
Freq: Once | INTRAVENOUS | Status: AC
  Administered 2018-11-13: 08:00:00 via INTRAVENOUS
  Filled 2018-11-13: qty 250

## 2018-11-13 MED ORDER — HEPARIN SOD (PORK) LOCK FLUSH 100 UNIT/ML IV SOLN
500.0000 [IU] | Freq: Once | INTRAVENOUS | Status: AC | PRN
  Administered 2018-11-13: 500 [IU]
  Filled 2018-11-13: qty 5

## 2018-11-13 MED ORDER — DEXAMETHASONE SODIUM PHOSPHATE 10 MG/ML IJ SOLN
INTRAMUSCULAR | Status: AC
  Filled 2018-11-13: qty 1

## 2018-11-13 MED FILL — HYDROCODONE-HOMATROPINE SOL: 5-1.5 | 9 days supply | Qty: 250 | Fill #0

## 2018-11-13 MED FILL — SOLIFENACIN SUCCINATE 10 MG: 10 | 90 days supply | Qty: 90 | Fill #0

## 2018-11-13 NOTE — Patient Instructions (Signed)
Etoposide, VP-16 injection What is this medicine? ETOPOSIDE, VP-16 (e toe POE side) is a chemotherapy drug. It is used to treat testicular cancer, lung cancer, and other cancers. This medicine may be used for other purposes; ask your health care provider or pharmacist if you have questions. COMMON BRAND NAME(S): Etopophos, Toposar, VePesid What should I tell my health care provider before I take this medicine? They need to know if you have any of these conditions: -infection -kidney disease -liver disease -low blood counts, like low white cell, platelet, or red cell counts -an unusual or allergic reaction to etoposide, other medicines, foods, dyes, or preservatives -pregnant or trying to get pregnant -breast-feeding How should I use this medicine? This medicine is for infusion into a vein. It is administered in a hospital or clinic by a specially trained health care professional. Talk to your pediatrician regarding the use of this medicine in children. Special care may be needed. Overdosage: If you think you have taken too much of this medicine contact a poison control center or emergency room at once. NOTE: This medicine is only for you. Do not share this medicine with others. What if I miss a dose? It is important not to miss your dose. Call your doctor or health care professional if you are unable to keep an appointment. What may interact with this medicine? -aspirin -certain medications for seizures like carbamazepine, phenobarbital, phenytoin, valproic acid -cyclosporine -levamisole -warfarin This list may not describe all possible interactions. Give your health care provider a list of all the medicines, herbs, non-prescription drugs, or dietary supplements you use. Also tell them if you smoke, drink alcohol, or use illegal drugs. Some items may interact with your medicine. What should I watch for while using this medicine? Visit your doctor for checks on your progress. This drug  may make you feel generally unwell. This is not uncommon, as chemotherapy can affect healthy cells as well as cancer cells. Report any side effects. Continue your course of treatment even though you feel ill unless your doctor tells you to stop. In some cases, you may be given additional medicines to help with side effects. Follow all directions for their use. Call your doctor or health care professional for advice if you get a fever, chills or sore throat, or other symptoms of a cold or flu. Do not treat yourself. This drug decreases your body's ability to fight infections. Try to avoid being around people who are sick. This medicine may increase your risk to bruise or bleed. Call your doctor or health care professional if you notice any unusual bleeding. Talk to your doctor about your risk of cancer. You may be more at risk for certain types of cancers if you take this medicine. Do not become pregnant while taking this medicine or for at least 6 months after stopping it. Women should inform their doctor if they wish to become pregnant or think they might be pregnant. Women of child-bearing potential will need to have a negative pregnancy test before starting this medicine. There is a potential for serious side effects to an unborn child. Talk to your health care professional or pharmacist for more information. Do not breast-feed an infant while taking this medicine. Men must use a latex condom during sexual contact with a woman while taking this medicine and for at least 4 months after stopping it. A latex condom is needed even if you have had a vasectomy. Contact your doctor right away if your partner becomes pregnant. Do   not donate sperm while taking this medicine and for at least 4 months after you stop taking this medicine. Men should inform their doctors if they wish to father a child. This medicine may lower sperm counts. What side effects may I notice from receiving this medicine? Side effects that  you should report to your doctor or health care professional as soon as possible: -allergic reactions like skin rash, itching or hives, swelling of the face, lips, or tongue -low blood counts - this medicine may decrease the number of white blood cells, red blood cells and platelets. You may be at increased risk for infections and bleeding. -signs of infection - fever or chills, cough, sore throat, pain or difficulty passing urine -signs of decreased platelets or bleeding - bruising, pinpoint red spots on the skin, black, tarry stools, blood in the urine -signs of decreased red blood cells - unusually weak or tired, fainting spells, lightheadedness -breathing problems -changes in vision -mouth or throat sores or ulcers -pain, redness, swelling or irritation at the injection site -pain, tingling, numbness in the hands or feet -redness, blistering, peeling or loosening of the skin, including inside the mouth -seizures -vomiting Side effects that usually do not require medical attention (report to your doctor or health care professional if they continue or are bothersome): -diarrhea -hair loss -loss of appetite -nausea -stomach pain This list may not describe all possible side effects. Call your doctor for medical advice about side effects. You may report side effects to FDA at 1-800-FDA-1088. Where should I keep my medicine? This drug is given in a hospital or clinic and will not be stored at home. NOTE: This sheet is a summary. It may not cover all possible information. If you have questions about this medicine, talk to your doctor, pharmacist, or health care provider.  2019 Elsevier/Gold Standard (2015-07-08 11:53:23)  

## 2018-11-14 ENCOUNTER — Other Ambulatory Visit: Payer: Self-pay

## 2018-11-14 ENCOUNTER — Other Ambulatory Visit: Payer: Self-pay | Admitting: Family

## 2018-11-14 ENCOUNTER — Inpatient Hospital Stay: Payer: 59

## 2018-11-14 ENCOUNTER — Encounter: Payer: Self-pay | Admitting: Family

## 2018-11-14 VITALS — BP 135/59 | HR 100 | Temp 98.3°F | Resp 18

## 2018-11-14 DIAGNOSIS — C3491 Malignant neoplasm of unspecified part of right bronchus or lung: Secondary | ICD-10-CM | POA: Diagnosis not present

## 2018-11-14 DIAGNOSIS — Z5112 Encounter for antineoplastic immunotherapy: Secondary | ICD-10-CM | POA: Diagnosis not present

## 2018-11-14 DIAGNOSIS — C7951 Secondary malignant neoplasm of bone: Secondary | ICD-10-CM | POA: Diagnosis not present

## 2018-11-14 DIAGNOSIS — Z5189 Encounter for other specified aftercare: Secondary | ICD-10-CM | POA: Diagnosis not present

## 2018-11-14 DIAGNOSIS — Z5111 Encounter for antineoplastic chemotherapy: Secondary | ICD-10-CM | POA: Diagnosis not present

## 2018-11-14 DIAGNOSIS — C787 Secondary malignant neoplasm of liver and intrahepatic bile duct: Secondary | ICD-10-CM | POA: Diagnosis not present

## 2018-11-14 MED ORDER — PEGFILGRASTIM 6 MG/0.6ML ~~LOC~~ PSKT
6.0000 mg | PREFILLED_SYRINGE | Freq: Once | SUBCUTANEOUS | Status: AC
  Administered 2018-11-14: 6 mg via SUBCUTANEOUS

## 2018-11-14 MED ORDER — DEXAMETHASONE SODIUM PHOSPHATE 10 MG/ML IJ SOLN
INTRAMUSCULAR | Status: AC
  Filled 2018-11-14: qty 1

## 2018-11-14 MED ORDER — SODIUM CHLORIDE 0.9% FLUSH
10.0000 mL | INTRAVENOUS | Status: DC | PRN
  Administered 2018-11-14: 10 mL
  Filled 2018-11-14: qty 10

## 2018-11-14 MED ORDER — SODIUM CHLORIDE 0.9 % IV SOLN
Freq: Once | INTRAVENOUS | Status: AC
  Administered 2018-11-14: 08:00:00 via INTRAVENOUS
  Filled 2018-11-14: qty 250

## 2018-11-14 MED ORDER — DEXAMETHASONE SODIUM PHOSPHATE 10 MG/ML IJ SOLN
10.0000 mg | Freq: Once | INTRAMUSCULAR | Status: AC
  Administered 2018-11-14: 10 mg via INTRAVENOUS

## 2018-11-14 MED ORDER — SODIUM CHLORIDE 0.9 % IV SOLN
60.0000 mg/m2 | Freq: Once | INTRAVENOUS | Status: AC
  Administered 2018-11-14: 120 mg via INTRAVENOUS
  Filled 2018-11-14: qty 6

## 2018-11-14 MED ORDER — HEPARIN SOD (PORK) LOCK FLUSH 100 UNIT/ML IV SOLN
500.0000 [IU] | Freq: Once | INTRAVENOUS | Status: AC | PRN
  Administered 2018-11-14: 500 [IU]
  Filled 2018-11-14: qty 5

## 2018-11-14 NOTE — Patient Instructions (Signed)
[facility] Discharge Instructions for Patients Receiving Chemotherapy  Today you received the following chemotherapy agents Etoposide (VP 16)  To help prevent nausea and vomiting after your treatment, we encourage you to take your nausea medication as prescribed.  If you develop nausea and vomiting that is not controlled by your nausea medication, call the clinic.   BELOW ARE SYMPTOMS THAT SHOULD BE REPORTED IMMEDIATELY:  *FEVER GREATER THAN 100.5 F  *CHILLS WITH OR WITHOUT FEVER  NAUSEA AND VOMITING THAT IS NOT CONTROLLED WITH YOUR NAUSEA MEDICATION  *UNUSUAL SHORTNESS OF BREATH  *UNUSUAL BRUISING OR BLEEDING  TENDERNESS IN MOUTH AND THROAT WITH OR WITHOUT PRESENCE OF ULCERS  *URINARY PROBLEMS  *BOWEL PROBLEMS  UNUSUAL RASH Items with * indicate a potential emergency and should be followed up as soon as possible.  Feel free to call the clinic should you have any questions or concerns. The clinic phone number is [phone].  Please show the [facility] at check-in to the Emergency Department and triage nurse.

## 2018-11-18 ENCOUNTER — Ambulatory Visit (HOSPITAL_COMMUNITY): Payer: 59

## 2018-11-19 DIAGNOSIS — T63461D Toxic effect of venom of wasps, accidental (unintentional), subsequent encounter: Secondary | ICD-10-CM | POA: Diagnosis not present

## 2018-11-19 DIAGNOSIS — T63451D Toxic effect of venom of hornets, accidental (unintentional), subsequent encounter: Secondary | ICD-10-CM | POA: Diagnosis not present

## 2018-11-25 ENCOUNTER — Other Ambulatory Visit: Payer: Self-pay

## 2018-11-25 ENCOUNTER — Encounter (INDEPENDENT_AMBULATORY_CARE_PROVIDER_SITE_OTHER): Payer: Self-pay | Admitting: Orthopedic Surgery

## 2018-11-25 ENCOUNTER — Ambulatory Visit (INDEPENDENT_AMBULATORY_CARE_PROVIDER_SITE_OTHER): Payer: 59 | Admitting: Orthopedic Surgery

## 2018-11-25 VITALS — Ht 68.0 in | Wt 205.0 lb

## 2018-11-25 DIAGNOSIS — L97511 Non-pressure chronic ulcer of other part of right foot limited to breakdown of skin: Secondary | ICD-10-CM | POA: Diagnosis not present

## 2018-11-25 DIAGNOSIS — B351 Tinea unguium: Secondary | ICD-10-CM

## 2018-11-25 DIAGNOSIS — M21371 Foot drop, right foot: Secondary | ICD-10-CM | POA: Diagnosis not present

## 2018-11-25 DIAGNOSIS — Z89432 Acquired absence of left foot: Secondary | ICD-10-CM

## 2018-11-25 DIAGNOSIS — M21372 Foot drop, left foot: Secondary | ICD-10-CM | POA: Diagnosis not present
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
## 2018-12-01 ENCOUNTER — Ambulatory Visit (HOSPITAL_COMMUNITY): Payer: Medicare Other

## 2018-12-02 ENCOUNTER — Ambulatory Visit (HOSPITAL_COMMUNITY)
Admission: RE | Admit: 2018-12-02 | Discharge: 2018-12-02 | Disposition: A | Discharge status: Home / Self Care | Payer: 59 | Source: Ambulatory Visit | Attending: Hematology & Oncology | Admitting: Hematology & Oncology

## 2018-12-02 ENCOUNTER — Other Ambulatory Visit: Payer: Self-pay

## 2018-12-02 DIAGNOSIS — C3491 Malignant neoplasm of unspecified part of right bronchus or lung: Secondary | ICD-10-CM | POA: Diagnosis not present

## 2018-12-02 DIAGNOSIS — M899 Disorder of bone, unspecified: Secondary | ICD-10-CM | POA: Diagnosis not present

## 2018-12-02 DIAGNOSIS — C3490 Malignant neoplasm of unspecified part of unspecified bronchus or lung: Secondary | ICD-10-CM | POA: Diagnosis not present

## 2018-12-02 LAB — GLUCOSE, CAPILLARY: Glucose-Capillary: 126 mg/dL — ABNORMAL HIGH (ref 70–99)

## 2018-12-02 MED ORDER — FLUDEOXYGLUCOSE F - 18 (FDG) INJECTION
10.2000 | Freq: Once | INTRAVENOUS | Status: AC | PRN
  Administered 2018-12-02: 10.2 via INTRAVENOUS

## 2018-12-03 ENCOUNTER — Encounter: Payer: Self-pay | Admitting: Hematology & Oncology

## 2018-12-03 ENCOUNTER — Inpatient Hospital Stay: Payer: 59

## 2018-12-03 ENCOUNTER — Other Ambulatory Visit: Payer: Self-pay

## 2018-12-03 ENCOUNTER — Inpatient Hospital Stay: Payer: 59 | Attending: Hematology & Oncology | Admitting: Hematology & Oncology

## 2018-12-03 VITALS — BP 139/75 | HR 96 | Temp 98.4°F | Resp 18 | Ht 68.0 in | Wt 205.0 lb

## 2018-12-03 DIAGNOSIS — C7951 Secondary malignant neoplasm of bone: Secondary | ICD-10-CM | POA: Insufficient documentation

## 2018-12-03 DIAGNOSIS — Z5111 Encounter for antineoplastic chemotherapy: Secondary | ICD-10-CM | POA: Insufficient documentation

## 2018-12-03 DIAGNOSIS — C3491 Malignant neoplasm of unspecified part of right bronchus or lung: Secondary | ICD-10-CM | POA: Insufficient documentation

## 2018-12-03 DIAGNOSIS — Z85528 Personal history of other malignant neoplasm of kidney: Secondary | ICD-10-CM | POA: Diagnosis not present

## 2018-12-03 DIAGNOSIS — Z5189 Encounter for other specified aftercare: Secondary | ICD-10-CM | POA: Diagnosis not present

## 2018-12-03 DIAGNOSIS — C787 Secondary malignant neoplasm of liver and intrahepatic bile duct: Secondary | ICD-10-CM | POA: Diagnosis not present

## 2018-12-03 DIAGNOSIS — Z8546 Personal history of malignant neoplasm of prostate: Secondary | ICD-10-CM | POA: Diagnosis not present

## 2018-12-03 DIAGNOSIS — Z5112 Encounter for antineoplastic immunotherapy: Secondary | ICD-10-CM | POA: Insufficient documentation

## 2018-12-03 DIAGNOSIS — Z95828 Presence of other vascular implants and grafts: Secondary | ICD-10-CM

## 2018-12-03 LAB — CMP (CANCER CENTER ONLY)
ALT: 23 U/L (ref 0–44)
AST: 16 U/L (ref 15–41)
Albumin: 3.9 g/dL (ref 3.5–5.0)
Alkaline Phosphatase: 89 U/L (ref 38–126)
Anion gap: 7 (ref 5–15)
BUN: 18 mg/dL (ref 8–23)
CO2: 25 mmol/L (ref 22–32)
Calcium: 9.8 mg/dL (ref 8.9–10.3)
Chloride: 106 mmol/L (ref 98–111)
Creatinine: 1.24 mg/dL (ref 0.61–1.24)
GFR, Est AFR Am: >60 mL/min (ref >60)
GFR, Estimated: >60 mL/min (ref >60)
Glucose, Bld: 110 mg/dL — ABNORMAL HIGH (ref 70–99)
Potassium: 4.3 mmol/L (ref 3.5–5.1)
Sodium: 138 mmol/L (ref 135–145)
Total Bilirubin: 0.3 mg/dL (ref 0.3–1.2)
Total Protein: 6.3 g/dL — ABNORMAL LOW (ref 6.5–8.1)

## 2018-12-03 LAB — LACTATE DEHYDROGENASE: LDH: 132 U/L (ref 98–192)

## 2018-12-03 LAB — CBC WITH DIFFERENTIAL (CANCER CENTER ONLY)
Abs Immature Granulocytes: 0.05 10*3/uL (ref 0.00–0.07)
Basophils Absolute: 0 10*3/uL (ref 0.0–0.1)
Basophils Relative: 1 %
Eosinophils Absolute: 0.1 10*3/uL (ref 0.0–0.5)
Eosinophils Relative: 2 %
HCT: 31.9 % — ABNORMAL LOW (ref 39.0–52.0)
Hemoglobin: 10.5 g/dL — ABNORMAL LOW (ref 13.0–17.0)
Immature Granulocytes: 1 %
Lymphocytes Relative: 9 %
Lymphs Abs: 0.5 10*3/uL — ABNORMAL LOW (ref 0.7–4.0)
MCH: 31.9 pg (ref 26.0–34.0)
MCHC: 32.9 g/dL (ref 30.0–36.0)
MCV: 97 fL (ref 80.0–100.0)
Monocytes Absolute: 0.6 10*3/uL (ref 0.1–1.0)
Monocytes Relative: 12 %
Neutro Abs: 4 10*3/uL (ref 1.7–7.7)
Neutrophils Relative %: 75 %
Platelet Count: 270 10*3/uL (ref 150–400)
RBC: 3.29 MIL/uL — ABNORMAL LOW (ref 4.22–5.81)
RDW: 18.5 % — ABNORMAL HIGH (ref 11.5–15.5)
WBC Count: 5.3 10*3/uL (ref 4.0–10.5)
nRBC: 0 % (ref 0.0–0.2)

## 2018-12-03 MED ORDER — HEPARIN SOD (PORK) LOCK FLUSH 100 UNIT/ML IV SOLN
500.0000 [IU] | Freq: Once | INTRAVENOUS | Status: AC
  Administered 2018-12-03: 500 [IU] via INTRAVENOUS
  Filled 2018-12-03: qty 5

## 2018-12-03 MED ORDER — SODIUM CHLORIDE 0.9% FLUSH
10.0000 mL | INTRAVENOUS | Status: DC | PRN
  Administered 2018-12-03: 10 mL via INTRAVENOUS
  Filled 2018-12-03: qty 10

## 2018-12-03 MED ORDER — SODIUM CHLORIDE 0.9 % IV SOLN
Freq: Once | INTRAVENOUS | Status: AC
  Administered 2018-12-03: 10:00:00 via INTRAVENOUS
  Filled 2018-12-03: qty 5

## 2018-12-03 MED ORDER — SODIUM CHLORIDE 0.9% FLUSH
10.0000 mL | INTRAVENOUS | Status: DC | PRN
  Administered 2018-12-03: 10 mL
  Filled 2018-12-03: qty 10

## 2018-12-03 MED ORDER — SODIUM CHLORIDE 0.9 % IV SOLN
Freq: Once | INTRAVENOUS | Status: AC
  Administered 2018-12-03: 10:00:00 via INTRAVENOUS
  Filled 2018-12-03: qty 250

## 2018-12-03 MED ORDER — PALONOSETRON HCL INJECTION 0.25 MG/5ML
0.2500 mg | Freq: Once | INTRAVENOUS | Status: AC
  Administered 2018-12-03: 0.25 mg via INTRAVENOUS

## 2018-12-03 MED ORDER — ONDANSETRON HCL 8 MG PO TABS: 8.0000 mg | ORAL_TABLET | Freq: Two times a day (BID) | ORAL | 1 refills | Status: DC | PRN

## 2018-12-03 MED ORDER — LORAZEPAM 0.5 MG PO TABS: 0.5000 mg | ORAL_TABLET | Freq: Four times a day (QID) | ORAL | 0 refills | Status: DC | PRN

## 2018-12-03 MED ORDER — HEPARIN SOD (PORK) LOCK FLUSH 100 UNIT/ML IV SOLN
500.0000 [IU] | Freq: Once | INTRAVENOUS | Status: AC | PRN
  Administered 2018-12-03: 500 [IU]
  Filled 2018-12-03: qty 5

## 2018-12-03 MED ORDER — PALONOSETRON HCL INJECTION 0.25 MG/5ML
INTRAVENOUS | Status: AC
  Filled 2018-12-03: qty 5

## 2018-12-03 MED ORDER — SODIUM CHLORIDE 0.9 % IV SOLN
60.0000 mg/m2 | Freq: Once | INTRAVENOUS | Status: AC
  Administered 2018-12-03: 120 mg via INTRAVENOUS
  Filled 2018-12-03: qty 6

## 2018-12-03 MED ORDER — SODIUM CHLORIDE 0.9 % IV SOLN
1200.0000 mg | Freq: Once | INTRAVENOUS | Status: AC
  Administered 2018-12-03: 1200 mg via INTRAVENOUS
  Filled 2018-12-03: qty 20

## 2018-12-03 MED ORDER — HYDROCODONE-HOMATROPINE 5-1.5 MG/5ML PO SYRP: 5.0000 mL | ORAL_SOLUTION | ORAL | 0 refills | Status: DC | PRN

## 2018-12-03 MED ORDER — SODIUM CHLORIDE 0.9 % IV SOLN
437.4000 mg | Freq: Once | INTRAVENOUS | Status: AC
  Administered 2018-12-03: 440 mg via INTRAVENOUS
  Filled 2018-12-03: qty 44

## 2018-12-03 MED FILL — ONDANSETRON HCL 8 MG TABLET: 8 | 15 days supply | Qty: 30 | Fill #0

## 2018-12-03 MED FILL — HYDROCODONE-HOMATROPINE SYR: 5-1.5 | 13 days supply | Qty: 250 | Fill #0

## 2018-12-03 MED FILL — LORazepam 0.5 MG TABS: 0.5 | 7 days supply | Qty: 30 | Fill #0

## 2018-12-03 NOTE — Progress Notes (Signed)
Hematology and Oncology Follow Up Visit  Taylor Elliott [id number] 01-06-1951 68 y.o. 12/03/2018   Principle Diagnosis:   Extensive stage small cell lung cancer  SIADH secondary to small cell lung cancer  Current Therapy:    Carboplatinum/etoposide/Tecentriq-cycle #4       Interim History:  Taylor Elliott is back for follow-up.  He is looking very good.  He is doing better.  So far, everything has been going quite well.  He has PET scan done yesterday.  Thankfully, the PET scan shows that he is still responding.  There really is no disease in the right hilum now.  There is no hepatic metastasis that is active.  His bony metastasis are becoming more sclerotic.  His sodium has been doing quite well.  This is use a an indicator as to whether or not his disease is coming back.  [place].  He did not wear sunscreen.  I got her on him for that.  I told him that the chemotherapy will clearly sensitize his skin to the sun.  Because of this, he needs to wear at least a sunscreen with a SPF 70 value.  He will go ahead with 2 more cycles of treatment.  After the 6 cycle of treatment, I will then consider radiation therapy to the hilar area were all this started.  I think this may not be a bad idea.  I think the other issue might be PCI.  He still has having some problems with his hip and his leg.  He sees orthopedic surgery for this.  He has had no issues with bowels or bladder.  Overall, his performance status is ECOG 1.    Medications:  Current Outpatient Medications:  .  allopurinol (ZYLOPRIM) 100 MG tablet, Take 2 tablets (200 mg total) by mouth every morning. (Patient taking differently: Take 100 mg by mouth daily. Pt taking 1 tablet daily), Disp: 180 tablet, Rfl: 3 .  amLODipine (NORVASC) 10 MG tablet, TAKE 1 TABLET BY MOUTH EVERY MORNING, Disp: 90 tablet, Rfl: 1 .  aspirin EC 81 MG tablet, Take 81 mg by mouth daily., Disp: , Rfl:  .  calcium  carbonate (TUMS - DOSED IN MG ELEMENTAL CALCIUM) 500 MG chewable tablet, Chew 2 tablets by mouth daily as needed for indigestion or heartburn. , Disp: , Rfl:  .  diphenhydrAMINE (SOMINEX) 25 MG tablet, Take 25 mg by mouth at bedtime as needed for sleep., Disp: , Rfl:  .  fenofibrate 160 MG tablet, Take 1 tablet (160 mg total) by mouth daily., Disp: 90 tablet, Rfl: 1 .  HYDROcodone-homatropine (HYCODAN) 5-1.5 MG/5ML syrup, Take 5 mLs by mouth every 4 (four) hours as needed for cough., Disp: 250 mL, Rfl: 0 .  Ipratropium-Albuterol (COMBIVENT) 20-100 MCG/ACT AERS respimat, Inhale 1 puff into the lungs every 6 (six) hours., Disp: 2 Inhaler, Rfl: 4 .  lidocaine-prilocaine (EMLA) cream, Apply to affected area once, Disp: 30 g, Rfl: 3 .  lisinopril (PRINIVIL,ZESTRIL) 20 MG tablet, TAKE 1 TABLET BY MOUTH ONCE DAILY, Disp: 90 tablet, Rfl: 1 .  LORazepam (ATIVAN) 0.5 MG tablet, Take 1 tablet (0.5 mg total) by mouth every 6 (six) hours as needed (Nausea or vomiting)., Disp: 30 tablet, Rfl: 0 .  magic mouthwash w/lidocaine SOLN, Take 5 mLs by mouth 3 (three) times daily as needed for mouth pain. benadryl 525 mg, hydrocortisone 60 mg and nystatin 0.6 mg., Disp: 240 mL, Rfl: 6 .  Multiple Vitamin (MULTIVITAMIN) tablet, Take 1 tablet by mouth daily., Disp: , Rfl:  .  ondansetron (ZOFRAN) 8 MG tablet, Take 1 tablet (8 mg total) by mouth 2 (two) times daily as needed for refractory nausea / vomiting. Start on day 3 after carboplatin chemo., Disp: 30 tablet, Rfl: 1 .  oxyCODONE (OXY IR/ROXICODONE) 5 MG immediate release tablet, Take 1 tablet (5 mg total) by mouth every 6 (six) hours as needed for severe pain., Disp: 60 tablet, Rfl: 0 .  prochlorperazine (COMPAZINE) 10 MG tablet, Take 1 tablet (10 mg total) by mouth every 6 (six) hours as needed (Nausea or vomiting)., Disp: 30 tablet, Rfl: 1 .  Pyridoxine HCl (VITAMIN B-6) 500 MG tablet, Take 1 tablet (500 mg total) by mouth daily., Disp: 60 tablet, Rfl: 4 .   solifenacin (VESICARE) 10 MG tablet, Take 10 mg by mouth every evening., Disp: , Rfl:  .  UNABLE TO FIND, Med Name: Allergy shots., Disp: , Rfl:  .  EPINEPHrine 0.3 mg/0.3 mL IJ SOAJ injection, Inject 0.3 mg into the muscle once., Disp: , Rfl:  .  rosuvastatin (CRESTOR) 10 MG tablet, TAKE 1 TABLET BY MOUTH ONCE DAILY (Patient not taking: Reported on 12/03/2018), Disp: 90 tablet, Rfl: 1  Allergies:  Allergies  Allergen Reactions  . Bee Venom Anaphylaxis  . Clindamycin/Lincomycin Hives    Past Medical History, Surgical history, Social history, and Family History were reviewed and updated.  Review of Systems: Review of Systems  Constitutional: Negative.   HENT:  Negative.   Eyes: Negative.   Respiratory: Positive for cough.   Cardiovascular: Negative.   Gastrointestinal: Negative.   Endocrine: Negative.   Genitourinary: Negative.    Musculoskeletal: Positive for back pain, myalgias and neck pain.  Skin: Positive for itching and rash.  Neurological: Negative.   Hematological: Negative.   Psychiatric/Behavioral: Negative.     Physical Exam:  height is 5\' 8"  (1.727 m) and weight is 205 lb (93 kg). His oral temperature is 98.4 F (36.9 C). His blood pressure is 139/75 and his pulse is 96. His respiration is 18 and oxygen saturation is 100%.   Wt Readings from Last 3 Encounters:  12/03/18 205 lb (93 kg)  11/25/18 205 lb (93 kg)  11/12/18 205 lb (93 kg)    Physical Exam Vitals signs reviewed.  HENT:     Head: Normocephalic and atraumatic.  Eyes:     Pupils: Pupils are equal, round, and reactive to light.  Neck:     Musculoskeletal: Normal range of motion.  Cardiovascular:     Rate and Rhythm: Normal rate and regular rhythm.     Heart sounds: Normal heart sounds.  Pulmonary:     Effort: Pulmonary effort is normal.     Breath sounds: Normal breath sounds.  Abdominal:     General: Bowel sounds are normal.     Palpations: Abdomen is soft.     Comments: Abdominal exam shows  a slightly obese abdomen.  He does have an umbilical hernia.  I really cannot palpate his liver at this point.  There is no fluid wave.  There is no inguinal adenopathy.  There is no splenomegaly.    Musculoskeletal: Normal range of motion.        General: No tenderness or deformity.  Lymphadenopathy:     Cervical: No cervical adenopathy.  Skin:    General: Skin is warm and dry.     Findings: No erythema or rash.  Neurological:     Mental  Status: He is alert and oriented to person, place, and time.  Psychiatric:        Behavior: Behavior normal.        Thought Content: Thought content normal.        Judgment: Judgment normal.      Lab Results  Component Value Date   WBC 5.3 12/03/2018   HGB 10.5 (L) 12/03/2018   HCT 31.9 (L) 12/03/2018   MCV 97.0 12/03/2018   PLT 270 12/03/2018     Chemistry      Component Value Date/Time   NA 138 12/03/2018 0845   NA 135 (L) 07/12/2015 0813   K 4.3 12/03/2018 0845   K 4.5 07/12/2015 0813   CL 106 12/03/2018 0845   CO2 25 12/03/2018 0845   CO2 24 07/12/2015 0813   BUN 18 12/03/2018 0845   BUN 15.7 07/12/2015 0813   CREATININE 1.24 12/03/2018 0845   CREATININE 1.6 (H) 07/12/2015 0813      Component Value Date/Time   CALCIUM 9.8 12/03/2018 0845   CALCIUM 10.1 07/12/2015 0813   ALKPHOS 89 12/03/2018 0845   ALKPHOS 115 07/12/2015 0813   AST 16 12/03/2018 0845   AST 22 07/12/2015 0813   ALT 23 12/03/2018 0845   ALT 24 07/12/2015 0813   BILITOT 0.3 12/03/2018 0845   BILITOT 0.46 07/12/2015 0813       Impression and Plan: Taylor Elliott is a 68 year old white male.  He has a past history of renal cell carcinoma and also prostate cancer.  Now, he has a third malignancy.  This is metastatic small cell lung cancer.  The PET scan shows that he is still responding.  Again, we will go with 2 additional cycles of chemoimmunotherapy.  After that, we will deafly do maintenance immunotherapy.  I would also consider radiation therapy for the  right hilar mass which is where this cancer started and possibly even PCI.  His wife is on the phone.  I spent about 30 minutes with he and his wife.  I reviewed his CT PET scans.  I went over all of his lab work.  I explained my recommendations.  He has a very good understanding.  I answered all the questions that his wife had.  We will get him back in 3 more weeks.  Volanda Napoleon, MD 5/6/20209:51 AM

## 2018-12-03 NOTE — Patient Instructions (Signed)
[facility] Discharge Instructions for Patients Receiving Chemotherapy  Today you received the following chemotherapy agents Tecentriq, Cytoxan, VP16.  To help prevent nausea and vomiting after your treatment, we encourage you to take your nausea medication as instructed by your MD.   If you develop nausea and vomiting that is not controlled by your nausea medication, call the clinic.   BELOW ARE SYMPTOMS THAT SHOULD BE REPORTED IMMEDIATELY:  *FEVER GREATER THAN 100.5 F  *CHILLS WITH OR WITHOUT FEVER  NAUSEA AND VOMITING THAT IS NOT CONTROLLED WITH YOUR NAUSEA MEDICATION  *UNUSUAL SHORTNESS OF BREATH  *UNUSUAL BRUISING OR BLEEDING  TENDERNESS IN MOUTH AND THROAT WITH OR WITHOUT PRESENCE OF ULCERS  *URINARY PROBLEMS  *BOWEL PROBLEMS  UNUSUAL RASH Items with * indicate a potential emergency and should be followed up as soon as possible.  Feel free to call the clinic should you have any questions or concerns. The clinic phone number is [phone].  Please show the [facility] at check-in to the Emergency Department and triage nurse.

## 2018-12-04 ENCOUNTER — Encounter: Payer: Self-pay | Admitting: Family

## 2018-12-04 ENCOUNTER — Inpatient Hospital Stay: Payer: 59

## 2018-12-04 ENCOUNTER — Other Ambulatory Visit: Payer: Self-pay

## 2018-12-04 VITALS — BP 140/73 | HR 103 | Temp 98.1°F | Resp 20

## 2018-12-04 DIAGNOSIS — C3491 Malignant neoplasm of unspecified part of right bronchus or lung: Secondary | ICD-10-CM | POA: Diagnosis not present

## 2018-12-04 DIAGNOSIS — C7951 Secondary malignant neoplasm of bone: Secondary | ICD-10-CM | POA: Diagnosis not present

## 2018-12-04 DIAGNOSIS — Z5111 Encounter for antineoplastic chemotherapy: Secondary | ICD-10-CM | POA: Diagnosis not present

## 2018-12-04 DIAGNOSIS — Z5112 Encounter for antineoplastic immunotherapy: Secondary | ICD-10-CM | POA: Diagnosis not present

## 2018-12-04 DIAGNOSIS — Z5189 Encounter for other specified aftercare: Secondary | ICD-10-CM | POA: Diagnosis not present

## 2018-12-04 DIAGNOSIS — C787 Secondary malignant neoplasm of liver and intrahepatic bile duct: Secondary | ICD-10-CM | POA: Diagnosis not present

## 2018-12-04 MED ORDER — SODIUM CHLORIDE 0.9% FLUSH
10.0000 mL | INTRAVENOUS | Status: DC | PRN
  Administered 2018-12-04: 10 mL
  Filled 2018-12-04: qty 10

## 2018-12-04 MED ORDER — SODIUM CHLORIDE 0.9 % IV SOLN
60.0000 mg/m2 | Freq: Once | INTRAVENOUS | Status: AC
  Administered 2018-12-04: 120 mg via INTRAVENOUS
  Filled 2018-12-04: qty 6

## 2018-12-04 MED ORDER — DEXAMETHASONE SODIUM PHOSPHATE 10 MG/ML IJ SOLN
INTRAMUSCULAR | Status: AC
  Filled 2018-12-04: qty 1

## 2018-12-04 MED ORDER — DEXAMETHASONE SODIUM PHOSPHATE 10 MG/ML IJ SOLN
10.0000 mg | Freq: Once | INTRAMUSCULAR | Status: AC
  Administered 2018-12-04: 10 mg via INTRAVENOUS

## 2018-12-04 MED ORDER — HEPARIN SOD (PORK) LOCK FLUSH 100 UNIT/ML IV SOLN
500.0000 [IU] | Freq: Once | INTRAVENOUS | Status: AC | PRN
  Administered 2018-12-04: 500 [IU]
  Filled 2018-12-04: qty 5

## 2018-12-04 MED ORDER — SODIUM CHLORIDE 0.9 % IV SOLN
Freq: Once | INTRAVENOUS | Status: AC
  Administered 2018-12-04: 08:00:00 via INTRAVENOUS
  Filled 2018-12-04: qty 250

## 2018-12-04 NOTE — Patient Instructions (Signed)
Dexamethasone injection What is this medicine? DEXAMETHASONE (dex a METH a sone) is a corticosteroid. It is used to treat inflammation of the skin, joints, lungs, and other organs. Common conditions treated include asthma, allergies, and arthritis. It is also used for other conditions, like blood disorders and diseases of the adrenal glands. This medicine may be used for other purposes; ask your health care provider or pharmacist if you have questions. COMMON BRAND NAME(S): Decadron, DoubleDex, Simplist Dexamethasone, Solurex What should I tell my health care provider before I take this medicine? They need to know if you have any of these conditions: -blood clotting problems -Cushing's syndrome -diabetes -glaucoma -heart problems or disease -high blood pressure -infection like herpes, measles, tuberculosis, or chickenpox -kidney disease -liver disease -mental problems -myasthenia gravis -osteoporosis -previous heart attack -seizures -stomach, ulcer or intestine disease including colitis and diverticulitis -thyroid problem -an unusual or allergic reaction to dexamethasone, corticosteroids, other medicines, lactose, foods, dyes, or preservatives -pregnant or trying to get pregnant -breast-feeding How should I use this medicine? This medicine is for injection into a muscle, joint, lesion, soft tissue, or vein. It is given by a health care professional in a hospital or clinic setting. Talk to your pediatrician regarding the use of this medicine in children. Special care may be needed. Overdosage: If you think you have taken too much of this medicine contact a poison control center or emergency room at once. NOTE: This medicine is only for you. Do not share this medicine with others. What if I miss a dose? This may not apply. If you are having a series of injections over a prolonged period, try not to miss an appointment. Call your doctor or health care professional to reschedule if you  are unable to keep an appointment. What may interact with this medicine? Do not take this medicine with any of the following medications: -mifepristone, RU-486 -vaccines This medicine may also interact with the following medications: -amphotericin B -antibiotics like clarithromycin, erythromycin, and troleandomycin -aspirin and aspirin-like drugs -barbiturates like phenobarbital -carbamazepine -cholestyramine -cholinesterase inhibitors like donepezil, galantamine, rivastigmine, and tacrine -cyclosporine -digoxin -diuretics -ephedrine -male hormones, like estrogens or progestins and birth control pills -indinavir -isoniazid -ketoconazole -medicines for diabetes -medicines that improve muscle tone or strength for conditions like myasthenia gravis -NSAIDs, medicines for pain and inflammation, like ibuprofen or naproxen -phenytoin -rifampin -thalidomide -warfarin This list may not describe all possible interactions. Give your health care provider a list of all the medicines, herbs, non-prescription drugs, or dietary supplements you use. Also tell them if you smoke, drink alcohol, or use illegal drugs. Some items may interact with your medicine. What should I watch for while using this medicine? Your condition will be monitored carefully while you are receiving this medicine. If you are taking this medicine for a long time, carry an identification card with your name and address, the type and dose of your medicine, and your doctor's name and address. This medicine may increase your risk of getting an infection. Stay away from people who are sick. Tell your doctor or health care professional if you are around anyone with measles or chickenpox. Talk to your health care provider before you get any vaccines that you take this medicine. If you are going to have surgery, tell your doctor or health care professional that you have taken this medicine within the last twelve months. Ask your  doctor or health care professional about your diet. You may need to lower the amount of salt you eat. The  medicine can increase your blood sugar. If you are a diabetic check with your doctor if you need help adjusting the dose of your diabetic medicine. What side effects may I notice from receiving this medicine? Side effects that you should report to your doctor or health care professional as soon as possible: -allergic reactions like skin rash, itching or hives, swelling of the face, lips, or tongue -black or tarry stools -change in the amount of urine -changes in vision -confusion, excitement, restlessness, a false sense of well-being -fever, sore throat, sneezing, cough, or other signs of infection, wounds that will not heal -hallucinations -increased thirst -mental depression, mood swings, mistaken feelings of self importance or of being mistreated -pain in hips, back, ribs, arms, shoulders, or legs -pain, redness, or irritation at the injection site -redness, blistering, peeling or loosening of the skin, including inside the mouth -rounding out of face -swelling of feet or lower legs -unusual bleeding or bruising -unusual tired or weak -wounds that do not heal Side effects that usually do not require medical attention (report to your doctor or health care professional if they continue or are bothersome): -diarrhea or constipation -change in taste -headache -nausea, vomiting -skin problems, acne, thin and shiny skin -touble sleeping -unusual growth of hair on the face or body -weight gain This list may not describe all possible side effects. Call your doctor for medical advice about side effects. You may report side effects to FDA at 1-800-FDA-1088. Where should I keep my medicine? This drug is given in a hospital or clinic and will not be stored at home. NOTE: This sheet is a summary. It may not cover all possible information. If you have questions about this medicine, talk to  your doctor, pharmacist, or health care provider.  2019 Elsevier/Gold Standard (2007-11-06 14:04:12) Etoposide, VP-16 capsules What is this medicine? ETOPOSIDE, VP-16 (e toe POE side) is a chemotherapy drug. It is used to treat small cell lung cancer and other cancers. This medicine may be used for other purposes; ask your health care provider or pharmacist if you have questions. COMMON BRAND NAME(S): VePesid What should I tell my health care provider before I take this medicine? They need to know if you have any of these conditions: -infection -kidney disease -liver disease -low blood counts, like low white cell, platelet, or red cell counts -an unusual or allergic reaction to etoposide, other medicines, foods, dyes, or preservatives -pregnant or trying to get pregnant -breast-feeding How should I use this medicine? Take this medicine by mouth with a glass of water. Follow the directions on the prescription label. Do not open, crush, or chew the capsules. It is advisable to wear gloves when handling this medicine. Take your medicine at regular intervals. Do not take it more often than directed. Do not stop taking except on your doctor's advice. Talk to your pediatrician regarding the use of this medicine in children. Special care may be needed. Overdosage: If you think you have taken too much of this medicine contact a poison control center or emergency room at once. NOTE: This medicine is only for you. Do not share this medicine with others. What if I miss a dose? If you miss a dose, take it as soon as you can. If it is almost time for your next dose, take only that dose. Do not take double or extra doses. What may interact with this medicine? -aspirin -certain medications for seizures like carbamazepine, phenobarbital, phenytoin, valproic acid -cyclosporine -levamisole -valproic acid -warfarin This  list may not describe all possible interactions. Give your health care provider a  list of all the medicines, herbs, non-prescription drugs, or dietary supplements you use. Also tell them if you smoke, drink alcohol, or use illegal drugs. Some items may interact with your medicine. What should I watch for while using this medicine? Visit your doctor for checks on your progress. This drug may make you feel generally unwell. This is not uncommon, as chemotherapy can affect healthy cells as well as cancer cells. Report any side effects. Continue your course of treatment even though you feel ill unless your doctor tells you to stop. In some cases, you may be given additional medicines to help with side effects. Follow all directions for their use. Call your doctor or health care professional for advice if you get a fever, chills or sore throat, or other symptoms of a cold or flu. Do not treat yourself. This drug decreases your body's ability to fight infections. Try to avoid being around people who are sick. This medicine may increase your risk to bruise or bleed. Call your doctor or health care professional if you notice any unusual bleeding. Talk to your doctor about your risk of cancer. You may be more at risk for certain types of cancers if you take this medicine. Do not become pregnant while taking this medicine or for at least 6 months after stopping it. Women should inform their doctor if they wish to become pregnant or think they might be pregnant. Women of child-bearing potential will need to have a negative pregnancy test before starting this medicine. There is a potential for serious side effects to an unborn child. Talk to your health care professional or pharmacist for more information. Do not breast-feed an infant while taking this medicine. Men must use a latex condom during sexual contact with a woman while taking this medicine and for at least 4 months after stopping it. A latex condom is needed even if you have had a vasectomy. Contact your doctor right away if your partner  becomes pregnant. Do not donate sperm while taking this medicine and for 4 months after you stop taking this medicine. Men should inform their doctors if they wish to father a child. This medicine may lower sperm counts. What side effects may I notice from receiving this medicine? Side effects that you should report to your doctor or health care professional as soon as possible: -allergic reactions like skin rash, itching or hives, swelling of the face, lips, or tongue -low blood counts - this medicine may decrease the number of white blood cells, red blood cells and platelets. You may be at increased risk for infections and bleeding. -signs of infection - fever or chills, cough, sore throat, pain or difficulty passing urine -signs of decreased platelets or bleeding - bruising, pinpoint red spots on the skin, black, tarry stools, blood in the urine -signs of decreased red blood cells - unusually weak or tired, fainting spells, lightheadedness -breathing problems -changes in vision -mouth or throat sores or ulcers -pain, tingling, numbness in the hands or feet -redness, blistering, peeling or loosening of the skin, including inside the mouth -seizures -vomiting Side effects that usually do not require medical attention (report to your doctor or health care professional if they continue or are bothersome): -change in taste -diarrhea -hair loss -nausea -stomach pain This list may not describe all possible side effects. Call your doctor for medical advice about side effects. You may report side effects to FDA  at 1-800-FDA-1088. Where should I keep my medicine? Keep out of the reach of children. Store in a refrigerator between 2 and 8 degrees C (36 and 46 degrees F). Do not freeze. Throw away any unused medicine after the expiration date. NOTE: This sheet is a summary. It may not cover all possible information. If you have questions about this medicine, talk to your doctor, pharmacist, or health  care provider.  2019 Elsevier/Gold Standard (2015-07-08 11:49:52)

## 2018-12-05 ENCOUNTER — Inpatient Hospital Stay: Payer: 59

## 2018-12-05 ENCOUNTER — Other Ambulatory Visit: Payer: Self-pay

## 2018-12-05 VITALS — BP 155/65 | HR 99 | Temp 98.1°F | Resp 18

## 2018-12-05 DIAGNOSIS — Z5111 Encounter for antineoplastic chemotherapy: Secondary | ICD-10-CM | POA: Diagnosis not present

## 2018-12-05 DIAGNOSIS — Z5189 Encounter for other specified aftercare: Secondary | ICD-10-CM | POA: Diagnosis not present

## 2018-12-05 DIAGNOSIS — C7951 Secondary malignant neoplasm of bone: Secondary | ICD-10-CM | POA: Diagnosis not present

## 2018-12-05 DIAGNOSIS — C787 Secondary malignant neoplasm of liver and intrahepatic bile duct: Secondary | ICD-10-CM | POA: Diagnosis not present

## 2018-12-05 DIAGNOSIS — C3491 Malignant neoplasm of unspecified part of right bronchus or lung: Secondary | ICD-10-CM | POA: Diagnosis not present

## 2018-12-05 DIAGNOSIS — Z5112 Encounter for antineoplastic immunotherapy: Secondary | ICD-10-CM | POA: Diagnosis not present

## 2018-12-05 MED ORDER — DEXAMETHASONE SODIUM PHOSPHATE 10 MG/ML IJ SOLN
INTRAMUSCULAR | Status: AC
  Filled 2018-12-05: qty 1

## 2018-12-05 MED ORDER — PEGFILGRASTIM 6 MG/0.6ML ~~LOC~~ PSKT
PREFILLED_SYRINGE | SUBCUTANEOUS | Status: AC
  Filled 2018-12-05: qty 0.6

## 2018-12-05 MED ORDER — SODIUM CHLORIDE 0.9 % IV SOLN
Freq: Once | INTRAVENOUS | Status: AC
  Administered 2018-12-05: 08:00:00 via INTRAVENOUS
  Filled 2018-12-05: qty 250

## 2018-12-05 MED ORDER — HEPARIN SOD (PORK) LOCK FLUSH 100 UNIT/ML IV SOLN
500.0000 [IU] | Freq: Once | INTRAVENOUS | Status: AC | PRN
  Administered 2018-12-05: 500 [IU]
  Filled 2018-12-05: qty 5

## 2018-12-05 MED ORDER — DEXAMETHASONE SODIUM PHOSPHATE 10 MG/ML IJ SOLN
10.0000 mg | Freq: Once | INTRAMUSCULAR | Status: AC
  Administered 2018-12-05: 10 mg via INTRAVENOUS

## 2018-12-05 MED ORDER — SODIUM CHLORIDE 0.9 % IV SOLN
60.0000 mg/m2 | Freq: Once | INTRAVENOUS | Status: AC
  Administered 2018-12-05: 120 mg via INTRAVENOUS
  Filled 2018-12-05: qty 6

## 2018-12-05 MED ORDER — PEGFILGRASTIM 6 MG/0.6ML ~~LOC~~ PSKT
6.0000 mg | PREFILLED_SYRINGE | Freq: Once | SUBCUTANEOUS | Status: AC
  Administered 2018-12-05: 6 mg via SUBCUTANEOUS

## 2018-12-05 MED ORDER — SODIUM CHLORIDE 0.9% FLUSH
10.0000 mL | INTRAVENOUS | Status: DC | PRN
  Administered 2018-12-05: 10 mL
  Filled 2018-12-05: qty 10

## 2018-12-05 NOTE — Patient Instructions (Addendum)
Dexamethasone injection What is this medicine? DEXAMETHASONE (dex a METH a sone) is a corticosteroid. It is used to treat inflammation of the skin, joints, lungs, and other organs. Common conditions treated include asthma, allergies, and arthritis. It is also used for other conditions, like blood disorders and diseases of the adrenal glands. This medicine may be used for other purposes; ask your health care provider or pharmacist if you have questions. COMMON BRAND NAME(S): Decadron, DoubleDex, Simplist Dexamethasone, Solurex What should I tell my health care provider before I take this medicine? They need to know if you have any of these conditions: -blood clotting problems -Cushing's syndrome -diabetes -glaucoma -heart problems or disease -high blood pressure -infection like herpes, measles, tuberculosis, or chickenpox -kidney disease -liver disease -mental problems -myasthenia gravis -osteoporosis -previous heart attack -seizures -stomach, ulcer or intestine disease including colitis and diverticulitis -thyroid problem -an unusual or allergic reaction to dexamethasone, corticosteroids, other medicines, lactose, foods, dyes, or preservatives -pregnant or trying to get pregnant -breast-feeding How should I use this medicine? This medicine is for injection into a muscle, joint, lesion, soft tissue, or vein. It is given by a health care professional in a hospital or clinic setting. Talk to your pediatrician regarding the use of this medicine in children. Special care may be needed. Overdosage: If you think you have taken too much of this medicine contact a poison control center or emergency room at once. NOTE: This medicine is only for you. Do not share this medicine with others. What if I miss a dose? This may not apply. If you are having a series of injections over a prolonged period, try not to miss an appointment. Call your doctor or health care professional to reschedule if you  are unable to keep an appointment. What may interact with this medicine? Do not take this medicine with any of the following medications: -mifepristone, RU-486 -vaccines This medicine may also interact with the following medications: -amphotericin B -antibiotics like clarithromycin, erythromycin, and troleandomycin -aspirin and aspirin-like drugs -barbiturates like phenobarbital -carbamazepine -cholestyramine -cholinesterase inhibitors like donepezil, galantamine, rivastigmine, and tacrine -cyclosporine -digoxin -diuretics -ephedrine -male hormones, like estrogens or progestins and birth control pills -indinavir -isoniazid -ketoconazole -medicines for diabetes -medicines that improve muscle tone or strength for conditions like myasthenia gravis -NSAIDs, medicines for pain and inflammation, like ibuprofen or naproxen -phenytoin -rifampin -thalidomide -warfarin This list may not describe all possible interactions. Give your health care provider a list of all the medicines, herbs, non-prescription drugs, or dietary supplements you use. Also tell them if you smoke, drink alcohol, or use illegal drugs. Some items may interact with your medicine. What should I watch for while using this medicine? Your condition will be monitored carefully while you are receiving this medicine. If you are taking this medicine for a long time, carry an identification card with your name and address, the type and dose of your medicine, and your doctor's name and address. This medicine may increase your risk of getting an infection. Stay away from people who are sick. Tell your doctor or health care professional if you are around anyone with measles or chickenpox. Talk to your health care provider before you get any vaccines that you take this medicine. If you are going to have surgery, tell your doctor or health care professional that you have taken this medicine within the last twelve months. Ask your  doctor or health care professional about your diet. You may need to lower the amount of salt you eat. The  medicine can increase your blood sugar. If you are a diabetic check with your doctor if you need help adjusting the dose of your diabetic medicine. What side effects may I notice from receiving this medicine? Side effects that you should report to your doctor or health care professional as soon as possible: -allergic reactions like skin rash, itching or hives, swelling of the face, lips, or tongue -black or tarry stools -change in the amount of urine -changes in vision -confusion, excitement, restlessness, a false sense of well-being -fever, sore throat, sneezing, cough, or other signs of infection, wounds that will not heal -hallucinations -increased thirst -mental depression, mood swings, mistaken feelings of self importance or of being mistreated -pain in hips, back, ribs, arms, shoulders, or legs -pain, redness, or irritation at the injection site -redness, blistering, peeling or loosening of the skin, including inside the mouth -rounding out of face -swelling of feet or lower legs -unusual bleeding or bruising -unusual tired or weak -wounds that do not heal Side effects that usually do not require medical attention (report to your doctor or health care professional if they continue or are bothersome): -diarrhea or constipation -change in taste -headache -nausea, vomiting -skin problems, acne, thin and shiny skin -touble sleeping -unusual growth of hair on the face or body -weight gain This list may not describe all possible side effects. Call your doctor for medical advice about side effects. You may report side effects to FDA at 1-800-FDA-1088. Where should I keep my medicine? This drug is given in a hospital or clinic and will not be stored at home. NOTE: This sheet is a summary. It may not cover all possible information. If you have questions about this medicine, talk to  your doctor, pharmacist, or health care provider.  2019 Elsevier/Gold Standard (2007-11-06 14:04:12) Etoposide, VP-16 injection What is this medicine? ETOPOSIDE, VP-16 (e toe POE side) is a chemotherapy drug. It is used to treat testicular cancer, lung cancer, and other cancers. This medicine may be used for other purposes; ask your health care provider or pharmacist if you have questions. COMMON BRAND NAME(S): Etopophos, Toposar, VePesid What should I tell my health care provider before I take this medicine? They need to know if you have any of these conditions: -infection -kidney disease -liver disease -low blood counts, like low white cell, platelet, or red cell counts -an unusual or allergic reaction to etoposide, other medicines, foods, dyes, or preservatives -pregnant or trying to get pregnant -breast-feeding How should I use this medicine? This medicine is for infusion into a vein. It is administered in a hospital or clinic by a specially trained health care professional. Talk to your pediatrician regarding the use of this medicine in children. Special care may be needed. Overdosage: If you think you have taken too much of this medicine contact a poison control center or emergency room at once. NOTE: This medicine is only for you. Do not share this medicine with others. What if I miss a dose? It is important not to miss your dose. Call your doctor or health care professional if you are unable to keep an appointment. What may interact with this medicine? -aspirin -certain medications for seizures like carbamazepine, phenobarbital, phenytoin, valproic acid -cyclosporine -levamisole -warfarin This list may not describe all possible interactions. Give your health care provider a list of all the medicines, herbs, non-prescription drugs, or dietary supplements you use. Also tell them if you smoke, drink alcohol, or use illegal drugs. Some items may interact with your  medicine. What  should I watch for while using this medicine? Visit your doctor for checks on your progress. This drug may make you feel generally unwell. This is not uncommon, as chemotherapy can affect healthy cells as well as cancer cells. Report any side effects. Continue your course of treatment even though you feel ill unless your doctor tells you to stop. In some cases, you may be given additional medicines to help with side effects. Follow all directions for their use. Call your doctor or health care professional for advice if you get a fever, chills or sore throat, or other symptoms of a cold or flu. Do not treat yourself. This drug decreases your body's ability to fight infections. Try to avoid being around people who are sick. This medicine may increase your risk to bruise or bleed. Call your doctor or health care professional if you notice any unusual bleeding. Talk to your doctor about your risk of cancer. You may be more at risk for certain types of cancers if you take this medicine. Do not become pregnant while taking this medicine or for at least 6 months after stopping it. Women should inform their doctor if they wish to become pregnant or think they might be pregnant. Women of child-bearing potential will need to have a negative pregnancy test before starting this medicine. There is a potential for serious side effects to an unborn child. Talk to your health care professional or pharmacist for more information. Do not breast-feed an infant while taking this medicine. Men must use a latex condom during sexual contact with a woman while taking this medicine and for at least 4 months after stopping it. A latex condom is needed even if you have had a vasectomy. Contact your doctor right away if your partner becomes pregnant. Do not donate sperm while taking this medicine and for at least 4 months after you stop taking this medicine. Men should inform their doctors if they wish to father a child. This medicine  may lower sperm counts. What side effects may I notice from receiving this medicine? Side effects that you should report to your doctor or health care professional as soon as possible: -allergic reactions like skin rash, itching or hives, swelling of the face, lips, or tongue -low blood counts - this medicine may decrease the number of white blood cells, red blood cells and platelets. You may be at increased risk for infections and bleeding. -signs of infection - fever or chills, cough, sore throat, pain or difficulty passing urine -signs of decreased platelets or bleeding - bruising, pinpoint red spots on the skin, black, tarry stools, blood in the urine -signs of decreased red blood cells - unusually weak or tired, fainting spells, lightheadedness -breathing problems -changes in vision -mouth or throat sores or ulcers -pain, redness, swelling or irritation at the injection site -pain, tingling, numbness in the hands or feet -redness, blistering, peeling or loosening of the skin, including inside the mouth -seizures -vomiting Side effects that usually do not require medical attention (report to your doctor or health care professional if they continue or are bothersome): -diarrhea -hair loss -loss of appetite -nausea -stomach pain This list may not describe all possible side effects. Call your doctor for medical advice about side effects. You may report side effects to FDA at 1-800-FDA-1088. Where should I keep my medicine? This drug is given in a hospital or clinic and will not be stored at home. NOTE: This sheet is a summary. It may not  cover all possible information. If you have questions about this medicine, talk to your doctor, pharmacist, or health care provider.  2019 Elsevier/Gold Standard (2015-07-08 11:53:23)

## 2018-12-23 DIAGNOSIS — T63451D Toxic effect of venom of hornets, accidental (unintentional), subsequent encounter: Secondary | ICD-10-CM | POA: Diagnosis not present

## 2018-12-23 DIAGNOSIS — T63461D Toxic effect of venom of wasps, accidental (unintentional), subsequent encounter: Secondary | ICD-10-CM | POA: Diagnosis not present

## 2018-12-31 ENCOUNTER — Encounter: Payer: Self-pay | Admitting: Hematology & Oncology

## 2018-12-31 ENCOUNTER — Inpatient Hospital Stay: Payer: 59

## 2018-12-31 ENCOUNTER — Other Ambulatory Visit: Payer: Self-pay

## 2018-12-31 ENCOUNTER — Inpatient Hospital Stay (HOSPITAL_BASED_OUTPATIENT_CLINIC_OR_DEPARTMENT_OTHER): Payer: 59 | Admitting: Hematology & Oncology

## 2018-12-31 ENCOUNTER — Inpatient Hospital Stay: Payer: 59 | Attending: Hematology & Oncology

## 2018-12-31 DIAGNOSIS — C3491 Malignant neoplasm of unspecified part of right bronchus or lung: Secondary | ICD-10-CM

## 2018-12-31 DIAGNOSIS — R05 Cough: Secondary | ICD-10-CM | POA: Diagnosis not present

## 2018-12-31 DIAGNOSIS — Z5112 Encounter for antineoplastic immunotherapy: Secondary | ICD-10-CM | POA: Diagnosis not present

## 2018-12-31 DIAGNOSIS — C7951 Secondary malignant neoplasm of bone: Secondary | ICD-10-CM | POA: Insufficient documentation

## 2018-12-31 DIAGNOSIS — Z8546 Personal history of malignant neoplasm of prostate: Secondary | ICD-10-CM | POA: Diagnosis not present

## 2018-12-31 DIAGNOSIS — Z5189 Encounter for other specified aftercare: Secondary | ICD-10-CM | POA: Insufficient documentation

## 2018-12-31 DIAGNOSIS — C787 Secondary malignant neoplasm of liver and intrahepatic bile duct: Secondary | ICD-10-CM

## 2018-12-31 DIAGNOSIS — Z85528 Personal history of other malignant neoplasm of kidney: Secondary | ICD-10-CM

## 2018-12-31 DIAGNOSIS — Z5111 Encounter for antineoplastic chemotherapy: Secondary | ICD-10-CM | POA: Diagnosis not present

## 2018-12-31 LAB — CBC WITH DIFFERENTIAL (CANCER CENTER ONLY)
Abs Immature Granulocytes: 0.03 10*3/uL (ref 0.00–0.07)
Basophils Absolute: 0.1 10*3/uL (ref 0.0–0.1)
Basophils Relative: 1 %
Eosinophils Absolute: 0.1 10*3/uL (ref 0.0–0.5)
Eosinophils Relative: 2 %
HCT: 32.3 % — ABNORMAL LOW (ref 39.0–52.0)
Hemoglobin: 10.7 g/dL — ABNORMAL LOW (ref 13.0–17.0)
Immature Granulocytes: 1 %
Lymphocytes Relative: 11 %
Lymphs Abs: 0.6 10*3/uL — ABNORMAL LOW (ref 0.7–4.0)
MCH: 32.5 pg (ref 26.0–34.0)
MCHC: 33.1 g/dL (ref 30.0–36.0)
MCV: 98.2 fL (ref 80.0–100.0)
Monocytes Absolute: 0.7 10*3/uL (ref 0.1–1.0)
Monocytes Relative: 14 %
Neutro Abs: 3.6 10*3/uL (ref 1.7–7.7)
Neutrophils Relative %: 71 %
Platelet Count: 220 10*3/uL (ref 150–400)
RBC: 3.29 MIL/uL — ABNORMAL LOW (ref 4.22–5.81)
RDW: 16.2 % — ABNORMAL HIGH (ref 11.5–15.5)
WBC Count: 5 10*3/uL (ref 4.0–10.5)
nRBC: 0 % (ref 0.0–0.2)

## 2018-12-31 LAB — CMP (CANCER CENTER ONLY)
ALT: 18 U/L (ref 0–44)
AST: 16 U/L (ref 15–41)
Albumin: 4 g/dL (ref 3.5–5.0)
Alkaline Phosphatase: 73 U/L (ref 38–126)
Anion gap: 8 (ref 5–15)
BUN: 30 mg/dL — ABNORMAL HIGH (ref 8–23)
CO2: 25 mmol/L (ref 22–32)
Calcium: 9.1 mg/dL (ref 8.9–10.3)
Chloride: 102 mmol/L (ref 98–111)
Creatinine: 1.49 mg/dL — ABNORMAL HIGH (ref 0.61–1.24)
GFR, Est AFR Am: 56 mL/min — ABNORMAL LOW (ref >60)
GFR, Estimated: 48 mL/min — ABNORMAL LOW (ref >60)
Glucose, Bld: 109 mg/dL — ABNORMAL HIGH (ref 70–99)
Potassium: 4.9 mmol/L (ref 3.5–5.1)
Sodium: 135 mmol/L (ref 135–145)
Total Bilirubin: 0.3 mg/dL (ref 0.3–1.2)
Total Protein: 6.4 g/dL — ABNORMAL LOW (ref 6.5–8.1)

## 2018-12-31 LAB — TSH: TSH: 3.143 u[IU]/mL (ref 0.320–4.118)

## 2018-12-31 LAB — LACTATE DEHYDROGENASE: LDH: 136 U/L (ref 98–192)

## 2018-12-31 MED ORDER — SODIUM CHLORIDE 0.9 % IV SOLN
60.0000 mg/m2 | Freq: Once | INTRAVENOUS | Status: AC
  Administered 2018-12-31: 12:00:00 120 mg via INTRAVENOUS
  Filled 2018-12-31: qty 6

## 2018-12-31 MED ORDER — SODIUM CHLORIDE 0.9 % IV SOLN
Freq: Once | INTRAVENOUS | Status: AC
  Administered 2018-12-31: 09:00:00 via INTRAVENOUS
  Filled 2018-12-31: qty 5

## 2018-12-31 MED ORDER — SODIUM CHLORIDE 0.9 % IV SOLN
382.9500 mg | Freq: Once | INTRAVENOUS | Status: AC
  Administered 2018-12-31: 12:00:00 380 mg via INTRAVENOUS
  Filled 2018-12-31: qty 38

## 2018-12-31 MED ORDER — SODIUM CHLORIDE 0.9 % IV SOLN
Freq: Once | INTRAVENOUS | Status: AC
  Administered 2018-12-31: 09:00:00 via INTRAVENOUS
  Filled 2018-12-31: qty 250

## 2018-12-31 MED ORDER — HEPARIN SOD (PORK) LOCK FLUSH 100 UNIT/ML IV SOLN
500.0000 [IU] | Freq: Once | INTRAVENOUS | Status: AC | PRN
  Administered 2018-12-31: 500 [IU]
  Filled 2018-12-31: qty 5

## 2018-12-31 MED ORDER — HYDROCODONE-HOMATROPINE 5-1.5 MG/5ML PO SYRP: 5.0000 mL | ORAL_SOLUTION | ORAL | 0 refills | Status: DC | PRN

## 2018-12-31 MED ORDER — SODIUM CHLORIDE 0.9 % IV SOLN
1200.0000 mg | Freq: Once | INTRAVENOUS | Status: AC
  Administered 2018-12-31: 1200 mg via INTRAVENOUS
  Filled 2018-12-31: qty 20

## 2018-12-31 MED ORDER — PALONOSETRON HCL INJECTION 0.25 MG/5ML
0.2500 mg | Freq: Once | INTRAVENOUS | Status: AC
  Administered 2018-12-31: 09:00:00 0.25 mg via INTRAVENOUS

## 2018-12-31 MED ORDER — LORAZEPAM 0.5 MG PO TABS: 0.5000 mg | ORAL_TABLET | Freq: Four times a day (QID) | ORAL | 0 refills | Status: DC | PRN

## 2018-12-31 MED ORDER — PALONOSETRON HCL INJECTION 0.25 MG/5ML
INTRAVENOUS | Status: AC
  Filled 2018-12-31: qty 5

## 2018-12-31 MED ORDER — HYDROCODONE-ACETAMINOPHEN 5-325 MG PO TABS: 1.0000 | ORAL_TABLET | Freq: Four times a day (QID) | ORAL | 0 refills | Status: DC | PRN

## 2018-12-31 MED ORDER — SODIUM CHLORIDE 0.9% FLUSH
10.0000 mL | INTRAVENOUS | Status: DC | PRN
  Administered 2018-12-31: 10 mL
  Filled 2018-12-31: qty 10

## 2018-12-31 MED FILL — HYDROCODON-APAP 5-325: 5-325 | 15 days supply | Qty: 60 | Fill #0

## 2018-12-31 MED FILL — HYDROCODONE-HOMATROPINE SYR: 5-1.5 | 13 days supply | Qty: 250 | Fill #0

## 2018-12-31 MED FILL — LORazepam 0.5 MG TABS: 0.5 | 7 days supply | Qty: 30 | Fill #0

## 2018-12-31 NOTE — Progress Notes (Signed)
DISCONTINUE ON PATHWAY REGIMEN - Small Cell Lung     Cycles 1 through 4, every 21 days:     Atezolizumab      Carboplatin      Etoposide    Cycles 5 and beyond, every 21 days:     Atezolizumab   **Always confirm dose/schedule in your pharmacy ordering system**  REASON: Other Reason PRIOR TREATMENT: [id number]: Atezolizumab 1,200 mg D1 + Carboplatin AUC=5 D1 + Etoposide 100 mg/m2 D1-3 q21 Days x 4 Cycles, Followed by Atezolizumab 1,200 mg Maintenance Until Progression or Unacceptable Toxicity TREATMENT RESPONSE: Complete Response (CR)  START OFF PATHWAY REGIMEN - Small Cell Lung   [id number]:Atezolizumab 1,200 mg q21 Days:   A cycle is every 21 days:     Atezolizumab   **Always confirm dose/schedule in your pharmacy ordering system**  Patient Characteristics: Newly Diagnosed, Preoperative or Nonsurgical Candidate (Clinical Staging), First Line, Extensive Stage Therapeutic Status: Newly Diagnosed, Preoperative or Nonsurgical Candidate (Clinical Staging) AJCC T Category: cTX AJCC N Category: cNX AJCC M Category: pM1c AJCC 8 Stage Grouping: IIIB Stage Classification: Extensive Intent of Therapy: Non-Curative / Palliative Intent, Discussed with Patient

## 2018-12-31 NOTE — Patient Instructions (Signed)
[facility] Discharge Instructions for Patients Receiving Chemotherapy  Today you received the following chemotherapy agents Carboplatin, Tecentriq, Alimta  To help prevent nausea and vomiting after your treatment, we encourage you to take your nausea medication    If you develop nausea and vomiting that is not controlled by your nausea medication, call the clinic.   BELOW ARE SYMPTOMS THAT SHOULD BE REPORTED IMMEDIATELY:  *FEVER GREATER THAN 100.5 F  *CHILLS WITH OR WITHOUT FEVER  NAUSEA AND VOMITING THAT IS NOT CONTROLLED WITH YOUR NAUSEA MEDICATION  *UNUSUAL SHORTNESS OF BREATH  *UNUSUAL BRUISING OR BLEEDING  TENDERNESS IN MOUTH AND THROAT WITH OR WITHOUT PRESENCE OF ULCERS  *URINARY PROBLEMS  *BOWEL PROBLEMS  UNUSUAL RASH Items with * indicate a potential emergency and should be followed up as soon as possible.  Feel free to call the clinic should you have any questions or concerns. The clinic phone number is [phone].  Please show the [facility] at check-in to the Emergency Department and triage nurse.

## 2018-12-31 NOTE — Progress Notes (Signed)
ON PATHWAY REGIMEN - Small Cell Lung  No Change  Continue With Treatment as Ordered.     Cycles 1 through 4, every 21 days:     Atezolizumab      Carboplatin      Etoposide    Cycles 5 and beyond, every 21 days:     Atezolizumab   **Always confirm dose/schedule in your pharmacy ordering system**  Patient Characteristics: Newly Diagnosed, Preoperative or Nonsurgical Candidate (Clinical Staging), First Line, Extensive Stage Therapeutic Status: Newly Diagnosed, Preoperative or Nonsurgical Candidate (Clinical Staging) AJCC T Category: cT3 AJCC N Category: cN2 AJCC M Category: pM1c AJCC 8 Stage Grouping: IVB Stage Classification: Extensive Intent of Therapy: Non-Curative / Palliative Intent, Discussed with Patient

## 2018-12-31 NOTE — Progress Notes (Signed)
Hematology and Oncology Follow Up Visit  Taylor Elliott [id number] 25-Mar-1951 68 y.o. 12/31/2018   Principle Diagnosis:   Extensive stage small cell lung cancer  SIADH secondary to small cell lung cancer  Current Therapy:    Carboplatinum/etoposide/Tecentriq-cycle #5       Interim History:  Taylor Elliott is back for follow-up.  He really is doing quite well.  He and his wife are down at the beach last week.  He had a wonderful time.  He was very liberal with using sunscreen.  Today will be his last cycle of chemoimmunotherapy.  After this, I would just use maintenance immunotherapy.  I still feel that we should consider radiation therapy to the right hilar mass where there is cancer started from.  I think this fantastic response, will be worthwhile doing radiation therapy.  He still has little cough.  He has had no increase shortness of breath.  He does have a lot of arthritic issues.  He has had issues.  He has a brace on his left leg.  He is going to have a brace put on his right leg.  He has had no problems with bowels or bladder.  There is no bleeding.  He certainly has not lost weight.  His appetite has been quite vigorous.   Medications:  Current Outpatient Medications:  .  allopurinol (ZYLOPRIM) 100 MG tablet, Take 2 tablets (200 mg total) by mouth every morning. (Patient taking differently: Take 100 mg by mouth daily. Pt taking 1 tablet daily), Disp: 180 tablet, Rfl: 3 .  amLODipine (NORVASC) 10 MG tablet, TAKE 1 TABLET BY MOUTH EVERY MORNING, Disp: 90 tablet, Rfl: 1 .  aspirin EC 81 MG tablet, Take 81 mg by mouth daily., Disp: , Rfl:  .  calcium carbonate (TUMS - DOSED IN MG ELEMENTAL CALCIUM) 500 MG chewable tablet, Chew 2 tablets by mouth daily as needed for indigestion or heartburn. , Disp: , Rfl:  .  diphenhydrAMINE (SOMINEX) 25 MG tablet, Take 25 mg by mouth at bedtime as needed for sleep., Disp: , Rfl:  .  EPINEPHrine 0.3 mg/0.3 mL IJ SOAJ injection, Inject 0.3 mg  into the muscle once., Disp: , Rfl:  .  fenofibrate 160 MG tablet, Take 1 tablet (160 mg total) by mouth daily., Disp: 90 tablet, Rfl: 1 .  HYDROcodone-homatropine (HYCODAN) 5-1.5 MG/5ML syrup, Take 5 mLs by mouth every 4 (four) hours as needed for cough., Disp: 250 mL, Rfl: 0 .  Ipratropium-Albuterol (COMBIVENT) 20-100 MCG/ACT AERS respimat, Inhale 1 puff into the lungs every 6 (six) hours., Disp: 2 Inhaler, Rfl: 4 .  lidocaine-prilocaine (EMLA) cream, Apply to affected area once, Disp: 30 g, Rfl: 3 .  lisinopril (PRINIVIL,ZESTRIL) 20 MG tablet, TAKE 1 TABLET BY MOUTH ONCE DAILY, Disp: 90 tablet, Rfl: 1 .  LORazepam (ATIVAN) 0.5 MG tablet, Take 1 tablet (0.5 mg total) by mouth every 6 (six) hours as needed (Nausea or vomiting)., Disp: 30 tablet, Rfl: 0 .  magic mouthwash w/lidocaine SOLN, Take 5 mLs by mouth 3 (three) times daily as needed for mouth pain. benadryl 525 mg, hydrocortisone 60 mg and nystatin 0.6 mg., Disp: 240 mL, Rfl: 6 .  Multiple Vitamin (MULTIVITAMIN) tablet, Take 1 tablet by mouth daily., Disp: , Rfl:  .  ondansetron (ZOFRAN) 8 MG tablet, Take 1 tablet (8 mg total) by mouth 2 (two) times daily as needed for refractory nausea / vomiting. Start on day 3 after carboplatin chemo., Disp: 30 tablet, Rfl: 1 .  oxyCODONE (  OXY IR/ROXICODONE) 5 MG immediate release tablet, Take 1 tablet (5 mg total) by mouth every 6 (six) hours as needed for severe pain., Disp: 60 tablet, Rfl: 0 .  prochlorperazine (COMPAZINE) 10 MG tablet, Take 1 tablet (10 mg total) by mouth every 6 (six) hours as needed (Nausea or vomiting)., Disp: 30 tablet, Rfl: 1 .  Pyridoxine HCl (VITAMIN B-6) 500 MG tablet, Take 1 tablet (500 mg total) by mouth daily., Disp: 60 tablet, Rfl: 4 .  rosuvastatin (CRESTOR) 10 MG tablet, TAKE 1 TABLET BY MOUTH ONCE DAILY (Patient not taking: Reported on 12/03/2018), Disp: 90 tablet, Rfl: 1 .  solifenacin (VESICARE) 10 MG tablet, Take 10 mg by mouth every evening., Disp: , Rfl:  .  UNABLE TO  FIND, Med Name: Allergy shots., Disp: , Rfl:   Allergies:  Allergies  Allergen Reactions  . Bee Venom Anaphylaxis  . Clindamycin/Lincomycin Hives    Past Medical History, Surgical history, Social history, and Family History were reviewed and updated.  Review of Systems: Review of Systems  Constitutional: Negative.   HENT:  Negative.   Eyes: Negative.   Respiratory: Positive for cough.   Cardiovascular: Negative.   Gastrointestinal: Negative.   Endocrine: Negative.   Genitourinary: Negative.    Musculoskeletal: Positive for back pain, myalgias and neck pain.  Skin: Positive for itching and rash.  Neurological: Negative.   Hematological: Negative.   Psychiatric/Behavioral: Negative.     Physical Exam:  weight is 208 lb 1.3 oz (94.4 kg). His oral temperature is 98.1 F (36.7 C). His blood pressure is 134/72 and his pulse is 83. His respiration is 17 and oxygen saturation is 100%.   Wt Readings from Last 3 Encounters:  12/31/18 208 lb 1.3 oz (94.4 kg)  12/31/18 208 lb (94.3 kg)  12/03/18 205 lb (93 kg)    Physical Exam Vitals signs reviewed.  HENT:     Head: Normocephalic and atraumatic.  Eyes:     Pupils: Pupils are equal, round, and reactive to light.  Neck:     Musculoskeletal: Normal range of motion.  Cardiovascular:     Rate and Rhythm: Normal rate and regular rhythm.     Heart sounds: Normal heart sounds.  Pulmonary:     Effort: Pulmonary effort is normal.     Breath sounds: Normal breath sounds.  Abdominal:     General: Bowel sounds are normal.     Palpations: Abdomen is soft.     Comments: Abdominal exam shows a slightly obese abdomen.  He does have an umbilical hernia.  I really cannot palpate his liver at this point.  There is no fluid wave.  There is no inguinal adenopathy.  There is no splenomegaly.    Musculoskeletal: Normal range of motion.        General: No tenderness or deformity.  Lymphadenopathy:     Cervical: No cervical adenopathy.  Skin:     General: Skin is warm and dry.     Findings: No erythema or rash.  Neurological:     Mental Status: He is alert and oriented to person, place, and time.  Psychiatric:        Behavior: Behavior normal.        Thought Content: Thought content normal.        Judgment: Judgment normal.      Lab Results  Component Value Date   WBC 5.3 12/03/2018   HGB 10.5 (L) 12/03/2018   HCT 31.9 (L) 12/03/2018   MCV 97.0 12/03/2018  PLT 270 12/03/2018     Chemistry      Component Value Date/Time   NA 138 12/03/2018 0845   NA 135 (L) 07/12/2015 0813   K 4.3 12/03/2018 0845   K 4.5 07/12/2015 0813   CL 106 12/03/2018 0845   CO2 25 12/03/2018 0845   CO2 24 07/12/2015 0813   BUN 18 12/03/2018 0845   BUN 15.7 07/12/2015 0813   CREATININE 1.24 12/03/2018 0845   CREATININE 1.6 (H) 07/12/2015 0813      Component Value Date/Time   CALCIUM 9.8 12/03/2018 0845   CALCIUM 10.1 07/12/2015 0813   ALKPHOS 89 12/03/2018 0845   ALKPHOS 115 07/12/2015 0813   AST 16 12/03/2018 0845   AST 22 07/12/2015 0813   ALT 23 12/03/2018 0845   ALT 24 07/12/2015 0813   BILITOT 0.3 12/03/2018 0845   BILITOT 0.46 07/12/2015 0813       Impression and Plan: Taylor Elliott is a 68 year old white male.  He has a past history of renal cell carcinoma and also prostate cancer.  Now, he has a third malignancy.  This is metastatic small cell lung cancer.  We will complete his chemoimmunotherapy today.  I will then plan for a PET scan to be done in about a month.  After he has a PET scan done, we will then start set him up with immunotherapy.  I think this will be helpful as a maintenance program.  I will also see about radiation therapy to the right hilar mass.  PCI.  This might be something reasonable to consider also.  His wife is on the cell phone.  I answered all of her questions.  I spent about 30 minutes with Taylor Elliott and his wife (who was on the cell phone).  I will plan to get him back after his PET scan is  done.  I will have to call Dr. Sondra Come of Radiation Oncology once we have the PET scan results.    Volanda Napoleon, MD 6/3/20208:20 AM

## 2019-01-01 ENCOUNTER — Ambulatory Visit: Payer: 59

## 2019-01-01 ENCOUNTER — Encounter: Payer: Self-pay | Admitting: *Deleted

## 2019-01-01 ENCOUNTER — Inpatient Hospital Stay: Payer: 59

## 2019-01-01 VITALS — BP 139/66 | HR 92 | Temp 98.5°F | Resp 18

## 2019-01-01 DIAGNOSIS — Z5189 Encounter for other specified aftercare: Secondary | ICD-10-CM | POA: Diagnosis not present

## 2019-01-01 DIAGNOSIS — C7951 Secondary malignant neoplasm of bone: Secondary | ICD-10-CM | POA: Diagnosis not present

## 2019-01-01 DIAGNOSIS — Z5112 Encounter for antineoplastic immunotherapy: Secondary | ICD-10-CM | POA: Diagnosis not present

## 2019-01-01 DIAGNOSIS — C3491 Malignant neoplasm of unspecified part of right bronchus or lung: Secondary | ICD-10-CM | POA: Diagnosis not present

## 2019-01-01 DIAGNOSIS — Z5111 Encounter for antineoplastic chemotherapy: Secondary | ICD-10-CM | POA: Diagnosis not present

## 2019-01-01 DIAGNOSIS — C787 Secondary malignant neoplasm of liver and intrahepatic bile duct: Secondary | ICD-10-CM | POA: Diagnosis not present

## 2019-01-01 LAB — T4: T4, Total: 6.9 ug/dL (ref 4.5–12.0)

## 2019-01-01 MED ORDER — SODIUM CHLORIDE 0.9 % IV SOLN
60.0000 mg/m2 | Freq: Once | INTRAVENOUS | Status: AC
  Administered 2019-01-01: 120 mg via INTRAVENOUS
  Filled 2019-01-01: qty 6

## 2019-01-01 MED ORDER — DEXAMETHASONE SODIUM PHOSPHATE 10 MG/ML IJ SOLN
10.0000 mg | Freq: Once | INTRAMUSCULAR | Status: AC
  Administered 2019-01-01: 10 mg via INTRAVENOUS

## 2019-01-01 MED ORDER — SODIUM CHLORIDE 0.9 % IV SOLN
Freq: Once | INTRAVENOUS | Status: AC
  Administered 2019-01-01: 08:00:00 via INTRAVENOUS
  Filled 2019-01-01: qty 250

## 2019-01-01 MED ORDER — DEXAMETHASONE SODIUM PHOSPHATE 10 MG/ML IJ SOLN
INTRAMUSCULAR | Status: AC
  Filled 2019-01-01: qty 1

## 2019-01-01 MED ORDER — HEPARIN SOD (PORK) LOCK FLUSH 100 UNIT/ML IV SOLN
500.0000 [IU] | Freq: Once | INTRAVENOUS | Status: AC | PRN
  Administered 2019-01-01: 500 [IU]
  Filled 2019-01-01: qty 5

## 2019-01-01 MED ORDER — SODIUM CHLORIDE 0.9% FLUSH
10.0000 mL | INTRAVENOUS | Status: DC | PRN
  Administered 2019-01-01: 10 mL
  Filled 2019-01-01: qty 10

## 2019-01-01 NOTE — Patient Instructions (Signed)
Dexamethasone injection What is this medicine? DEXAMETHASONE (dex a METH a sone) is a corticosteroid. It is used to treat inflammation of the skin, joints, lungs, and other organs. Common conditions treated include asthma, allergies, and arthritis. It is also used for other conditions, like blood disorders and diseases of the adrenal glands. This medicine may be used for other purposes; ask your health care provider or pharmacist if you have questions. COMMON BRAND NAME(S): Decadron, DoubleDex, Simplist Dexamethasone, Solurex What should I tell my health care provider before I take this medicine? They need to know if you have any of these conditions: -blood clotting problems -Cushing's syndrome -diabetes -glaucoma -heart problems or disease -high blood pressure -infection like herpes, measles, tuberculosis, or chickenpox -kidney disease -liver disease -mental problems -myasthenia gravis -osteoporosis -previous heart attack -seizures -stomach, ulcer or intestine disease including colitis and diverticulitis -thyroid problem -an unusual or allergic reaction to dexamethasone, corticosteroids, other medicines, lactose, foods, dyes, or preservatives -pregnant or trying to get pregnant -breast-feeding How should I use this medicine? This medicine is for injection into a muscle, joint, lesion, soft tissue, or vein. It is given by a health care professional in a hospital or clinic setting. Talk to your pediatrician regarding the use of this medicine in children. Special care may be needed. Overdosage: If you think you have taken too much of this medicine contact a poison control center or emergency room at once. NOTE: This medicine is only for you. Do not share this medicine with others. What if I miss a dose? This may not apply. If you are having a series of injections over a prolonged period, try not to miss an appointment. Call your doctor or health care professional to reschedule if you  are unable to keep an appointment. What may interact with this medicine? Do not take this medicine with any of the following medications: -mifepristone, RU-486 -vaccines This medicine may also interact with the following medications: -amphotericin B -antibiotics like clarithromycin, erythromycin, and troleandomycin -aspirin and aspirin-like drugs -barbiturates like phenobarbital -carbamazepine -cholestyramine -cholinesterase inhibitors like donepezil, galantamine, rivastigmine, and tacrine -cyclosporine -digoxin -diuretics -ephedrine -male hormones, like estrogens or progestins and birth control pills -indinavir -isoniazid -ketoconazole -medicines for diabetes -medicines that improve muscle tone or strength for conditions like myasthenia gravis -NSAIDs, medicines for pain and inflammation, like ibuprofen or naproxen -phenytoin -rifampin -thalidomide -warfarin This list may not describe all possible interactions. Give your health care provider a list of all the medicines, herbs, non-prescription drugs, or dietary supplements you use. Also tell them if you smoke, drink alcohol, or use illegal drugs. Some items may interact with your medicine. What should I watch for while using this medicine? Your condition will be monitored carefully while you are receiving this medicine. If you are taking this medicine for a long time, carry an identification card with your name and address, the type and dose of your medicine, and your doctor's name and address. This medicine may increase your risk of getting an infection. Stay away from people who are sick. Tell your doctor or health care professional if you are around anyone with measles or chickenpox. Talk to your health care provider before you get any vaccines that you take this medicine. If you are going to have surgery, tell your doctor or health care professional that you have taken this medicine within the last twelve months. Ask your  doctor or health care professional about your diet. You may need to lower the amount of salt you eat. The  medicine can increase your blood sugar. If you are a diabetic check with your doctor if you need help adjusting the dose of your diabetic medicine. What side effects may I notice from receiving this medicine? Side effects that you should report to your doctor or health care professional as soon as possible: -allergic reactions like skin rash, itching or hives, swelling of the face, lips, or tongue -black or tarry stools -change in the amount of urine -changes in vision -confusion, excitement, restlessness, a false sense of well-being -fever, sore throat, sneezing, cough, or other signs of infection, wounds that will not heal -hallucinations -increased thirst -mental depression, mood swings, mistaken feelings of self importance or of being mistreated -pain in hips, back, ribs, arms, shoulders, or legs -pain, redness, or irritation at the injection site -redness, blistering, peeling or loosening of the skin, including inside the mouth -rounding out of face -swelling of feet or lower legs -unusual bleeding or bruising -unusual tired or weak -wounds that do not heal Side effects that usually do not require medical attention (report to your doctor or health care professional if they continue or are bothersome): -diarrhea or constipation -change in taste -headache -nausea, vomiting -skin problems, acne, thin and shiny skin -touble sleeping -unusual growth of hair on the face or body -weight gain This list may not describe all possible side effects. Call your doctor for medical advice about side effects. You may report side effects to FDA at 1-800-FDA-1088. Where should I keep my medicine? This drug is given in a hospital or clinic and will not be stored at home. NOTE: This sheet is a summary. It may not cover all possible information. If you have questions about this medicine, talk to  your doctor, pharmacist, or health care provider.  2019 Elsevier/Gold Standard (2007-11-06 14:04:12) Etoposide, VP-16 injection What is this medicine? ETOPOSIDE, VP-16 (e toe POE side) is a chemotherapy drug. It is used to treat testicular cancer, lung cancer, and other cancers. This medicine may be used for other purposes; ask your health care provider or pharmacist if you have questions. COMMON BRAND NAME(S): Etopophos, Toposar, VePesid What should I tell my health care provider before I take this medicine? They need to know if you have any of these conditions: -infection -kidney disease -liver disease -low blood counts, like low white cell, platelet, or red cell counts -an unusual or allergic reaction to etoposide, other medicines, foods, dyes, or preservatives -pregnant or trying to get pregnant -breast-feeding How should I use this medicine? This medicine is for infusion into a vein. It is administered in a hospital or clinic by a specially trained health care professional. Talk to your pediatrician regarding the use of this medicine in children. Special care may be needed. Overdosage: If you think you have taken too much of this medicine contact a poison control center or emergency room at once. NOTE: This medicine is only for you. Do not share this medicine with others. What if I miss a dose? It is important not to miss your dose. Call your doctor or health care professional if you are unable to keep an appointment. What may interact with this medicine? -aspirin -certain medications for seizures like carbamazepine, phenobarbital, phenytoin, valproic acid -cyclosporine -levamisole -warfarin This list may not describe all possible interactions. Give your health care provider a list of all the medicines, herbs, non-prescription drugs, or dietary supplements you use. Also tell them if you smoke, drink alcohol, or use illegal drugs. Some items may interact with your  medicine. What  should I watch for while using this medicine? Visit your doctor for checks on your progress. This drug may make you feel generally unwell. This is not uncommon, as chemotherapy can affect healthy cells as well as cancer cells. Report any side effects. Continue your course of treatment even though you feel ill unless your doctor tells you to stop. In some cases, you may be given additional medicines to help with side effects. Follow all directions for their use. Call your doctor or health care professional for advice if you get a fever, chills or sore throat, or other symptoms of a cold or flu. Do not treat yourself. This drug decreases your body's ability to fight infections. Try to avoid being around people who are sick. This medicine may increase your risk to bruise or bleed. Call your doctor or health care professional if you notice any unusual bleeding. Talk to your doctor about your risk of cancer. You may be more at risk for certain types of cancers if you take this medicine. Do not become pregnant while taking this medicine or for at least 6 months after stopping it. Women should inform their doctor if they wish to become pregnant or think they might be pregnant. Women of child-bearing potential will need to have a negative pregnancy test before starting this medicine. There is a potential for serious side effects to an unborn child. Talk to your health care professional or pharmacist for more information. Do not breast-feed an infant while taking this medicine. Men must use a latex condom during sexual contact with a woman while taking this medicine and for at least 4 months after stopping it. A latex condom is needed even if you have had a vasectomy. Contact your doctor right away if your partner becomes pregnant. Do not donate sperm while taking this medicine and for at least 4 months after you stop taking this medicine. Men should inform their doctors if they wish to father a child. This medicine  may lower sperm counts. What side effects may I notice from receiving this medicine? Side effects that you should report to your doctor or health care professional as soon as possible: -allergic reactions like skin rash, itching or hives, swelling of the face, lips, or tongue -low blood counts - this medicine may decrease the number of white blood cells, red blood cells and platelets. You may be at increased risk for infections and bleeding. -signs of infection - fever or chills, cough, sore throat, pain or difficulty passing urine -signs of decreased platelets or bleeding - bruising, pinpoint red spots on the skin, black, tarry stools, blood in the urine -signs of decreased red blood cells - unusually weak or tired, fainting spells, lightheadedness -breathing problems -changes in vision -mouth or throat sores or ulcers -pain, redness, swelling or irritation at the injection site -pain, tingling, numbness in the hands or feet -redness, blistering, peeling or loosening of the skin, including inside the mouth -seizures -vomiting Side effects that usually do not require medical attention (report to your doctor or health care professional if they continue or are bothersome): -diarrhea -hair loss -loss of appetite -nausea -stomach pain This list may not describe all possible side effects. Call your doctor for medical advice about side effects. You may report side effects to FDA at 1-800-FDA-1088. Where should I keep my medicine? This drug is given in a hospital or clinic and will not be stored at home. NOTE: This sheet is a summary. It may not  cover all possible information. If you have questions about this medicine, talk to your doctor, pharmacist, or health care provider.  2019 Elsevier/Gold Standard (2015-07-08 11:53:23)

## 2019-01-02 ENCOUNTER — Inpatient Hospital Stay: Payer: 59

## 2019-01-02 ENCOUNTER — Other Ambulatory Visit: Payer: Self-pay

## 2019-01-02 ENCOUNTER — Ambulatory Visit: Payer: 59

## 2019-01-02 VITALS — BP 143/72 | HR 82 | Temp 98.4°F | Resp 18

## 2019-01-02 DIAGNOSIS — C787 Secondary malignant neoplasm of liver and intrahepatic bile duct: Secondary | ICD-10-CM | POA: Diagnosis not present

## 2019-01-02 DIAGNOSIS — C3491 Malignant neoplasm of unspecified part of right bronchus or lung: Secondary | ICD-10-CM

## 2019-01-02 DIAGNOSIS — Z5111 Encounter for antineoplastic chemotherapy: Secondary | ICD-10-CM | POA: Diagnosis not present

## 2019-01-02 DIAGNOSIS — C7951 Secondary malignant neoplasm of bone: Secondary | ICD-10-CM | POA: Diagnosis not present

## 2019-01-02 DIAGNOSIS — Z5189 Encounter for other specified aftercare: Secondary | ICD-10-CM | POA: Diagnosis not present

## 2019-01-02 DIAGNOSIS — Z5112 Encounter for antineoplastic immunotherapy: Secondary | ICD-10-CM | POA: Diagnosis not present

## 2019-01-02 MED ORDER — SODIUM CHLORIDE 0.9% FLUSH
10.0000 mL | INTRAVENOUS | Status: DC | PRN
  Administered 2019-01-02: 10 mL
  Filled 2019-01-02: qty 10

## 2019-01-02 MED ORDER — SODIUM CHLORIDE 0.9 % IV SOLN
60.0000 mg/m2 | Freq: Once | INTRAVENOUS | Status: AC
  Administered 2019-01-02: 120 mg via INTRAVENOUS
  Filled 2019-01-02: qty 6

## 2019-01-02 MED ORDER — PEGFILGRASTIM 6 MG/0.6ML ~~LOC~~ PSKT
PREFILLED_SYRINGE | SUBCUTANEOUS | Status: AC
  Filled 2019-01-02: qty 0.6

## 2019-01-02 MED ORDER — DEXAMETHASONE SODIUM PHOSPHATE 10 MG/ML IJ SOLN
10.0000 mg | Freq: Once | INTRAMUSCULAR | Status: AC
  Administered 2019-01-02: 10 mg via INTRAVENOUS

## 2019-01-02 MED ORDER — HEPARIN SOD (PORK) LOCK FLUSH 100 UNIT/ML IV SOLN
500.0000 [IU] | Freq: Once | INTRAVENOUS | Status: AC | PRN
  Administered 2019-01-02: 500 [IU]
  Filled 2019-01-02: qty 5

## 2019-01-02 MED ORDER — DEXAMETHASONE SODIUM PHOSPHATE 10 MG/ML IJ SOLN
INTRAMUSCULAR | Status: AC
  Filled 2019-01-02: qty 1

## 2019-01-02 MED ORDER — PEGFILGRASTIM 6 MG/0.6ML ~~LOC~~ PSKT
6.0000 mg | PREFILLED_SYRINGE | Freq: Once | SUBCUTANEOUS | Status: AC
  Administered 2019-01-02: 6 mg via SUBCUTANEOUS

## 2019-01-02 MED ORDER — SODIUM CHLORIDE 0.9 % IV SOLN
Freq: Once | INTRAVENOUS | Status: AC
  Administered 2019-01-02: 08:00:00 via INTRAVENOUS
  Filled 2019-01-02: qty 250

## 2019-01-02 MED FILL — FENOFIBRATE 160 MG TABLET: 160 | 90 days supply | Qty: 90 | Fill #1

## 2019-01-02 MED FILL — AMLODIPINE BESYLATE 10 MG T: 10 | 90 days supply | Qty: 90 | Fill #1

## 2019-01-02 MED FILL — LISINOPRIL 20 MG TABLET: 20 | 90 days supply | Qty: 90 | Fill #1

## 2019-01-02 NOTE — Patient Instructions (Signed)
[facility] Discharge Instructions for Patients Receiving Chemotherapy  Today you received the following chemotherapy agents Etoposide (VP16) To help prevent nausea and vomiting after your treatment, we encourage you to take your nausea medication as prescribed.   If you develop nausea and vomiting that is not controlled by your nausea medication, call the clinic.   BELOW ARE SYMPTOMS THAT SHOULD BE REPORTED IMMEDIATELY:  *FEVER GREATER THAN 100.5 F  *CHILLS WITH OR WITHOUT FEVER  NAUSEA AND VOMITING THAT IS NOT CONTROLLED WITH YOUR NAUSEA MEDICATION  *UNUSUAL SHORTNESS OF BREATH  *UNUSUAL BRUISING OR BLEEDING  TENDERNESS IN MOUTH AND THROAT WITH OR WITHOUT PRESENCE OF ULCERS  *URINARY PROBLEMS  *BOWEL PROBLEMS  UNUSUAL RASH Items with * indicate a potential emergency and should be followed up as soon as possible.  Feel free to call the clinic should you have any questions or concerns. The clinic phone number is [phone].  Please show the [facility] at check-in to the Emergency Department and triage nurse.

## 2019-01-03 ENCOUNTER — Encounter: Payer: Self-pay | Admitting: Family

## 2019-01-06 ENCOUNTER — Telehealth: Payer: Self-pay | Admitting: Orthopedic Surgery

## 2019-01-06 NOTE — Telephone Encounter (Signed)
11/25/18 ov note faxed to [facility] clinic with [facility] [phone]

## 2019-01-15 ENCOUNTER — Encounter (INDEPENDENT_AMBULATORY_CARE_PROVIDER_SITE_OTHER): Payer: 59 | Admitting: Ophthalmology

## 2019-01-15 ENCOUNTER — Other Ambulatory Visit: Payer: Self-pay

## 2019-01-15 DIAGNOSIS — H4312 Vitreous hemorrhage, left eye: Secondary | ICD-10-CM

## 2019-01-15 DIAGNOSIS — H43813 Vitreous degeneration, bilateral: Secondary | ICD-10-CM

## 2019-01-15 DIAGNOSIS — H35033 Hypertensive retinopathy, bilateral: Secondary | ICD-10-CM | POA: Diagnosis not present

## 2019-01-15 DIAGNOSIS — I10 Essential (primary) hypertension: Secondary | ICD-10-CM

## 2019-01-15 DIAGNOSIS — H40013 Open angle with borderline findings, low risk, bilateral: Secondary | ICD-10-CM | POA: Diagnosis not present

## 2019-01-15 DIAGNOSIS — H3562 Retinal hemorrhage, left eye: Secondary | ICD-10-CM | POA: Diagnosis not present

## 2019-01-15 DIAGNOSIS — H43393 Other vitreous opacities, bilateral: Secondary | ICD-10-CM | POA: Diagnosis not present

## 2019-01-19 ENCOUNTER — Ambulatory Visit: Payer: Self-pay | Admitting: Orthopedic Surgery

## 2019-01-20 ENCOUNTER — Encounter: Payer: Self-pay | Admitting: Orthopedic Surgery

## 2019-01-20 ENCOUNTER — Ambulatory Visit (INDEPENDENT_AMBULATORY_CARE_PROVIDER_SITE_OTHER): Payer: 59 | Admitting: Physician Assistant

## 2019-01-20 ENCOUNTER — Other Ambulatory Visit: Payer: Self-pay

## 2019-01-20 VITALS — Ht 68.0 in | Wt 208.0 lb

## 2019-01-20 DIAGNOSIS — Z89432 Acquired absence of left foot: Secondary | ICD-10-CM

## 2019-01-20 DIAGNOSIS — M21371 Foot drop, right foot: Secondary | ICD-10-CM

## 2019-01-20 DIAGNOSIS — L97511 Non-pressure chronic ulcer of other part of right foot limited to breakdown of skin: Secondary | ICD-10-CM

## 2019-01-20 DIAGNOSIS — B351 Tinea unguium: Secondary | ICD-10-CM | POA: Diagnosis not present

## 2019-01-20 DIAGNOSIS — M21372 Foot drop, left foot: Secondary | ICD-10-CM | POA: Diagnosis not present
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
## 2019-01-21 ENCOUNTER — Other Ambulatory Visit: Payer: Self-pay

## 2019-01-21 ENCOUNTER — Inpatient Hospital Stay: Payer: 59

## 2019-01-21 ENCOUNTER — Other Ambulatory Visit: Payer: 59

## 2019-01-21 ENCOUNTER — Inpatient Hospital Stay (HOSPITAL_BASED_OUTPATIENT_CLINIC_OR_DEPARTMENT_OTHER): Payer: 59 | Admitting: Hematology & Oncology

## 2019-01-21 VITALS — BP 120/70 | HR 88 | Temp 98.9°F | Resp 18

## 2019-01-21 DIAGNOSIS — R05 Cough: Secondary | ICD-10-CM | POA: Diagnosis not present

## 2019-01-21 DIAGNOSIS — Z5189 Encounter for other specified aftercare: Secondary | ICD-10-CM | POA: Diagnosis not present

## 2019-01-21 DIAGNOSIS — C3491 Malignant neoplasm of unspecified part of right bronchus or lung: Secondary | ICD-10-CM

## 2019-01-21 DIAGNOSIS — Z5112 Encounter for antineoplastic immunotherapy: Secondary | ICD-10-CM | POA: Diagnosis not present

## 2019-01-21 DIAGNOSIS — C751 Malignant neoplasm of pituitary gland: Secondary | ICD-10-CM | POA: Diagnosis not present

## 2019-01-21 DIAGNOSIS — Z8546 Personal history of malignant neoplasm of prostate: Secondary | ICD-10-CM | POA: Diagnosis not present

## 2019-01-21 DIAGNOSIS — G629 Polyneuropathy, unspecified: Secondary | ICD-10-CM | POA: Diagnosis not present

## 2019-01-21 DIAGNOSIS — C787 Secondary malignant neoplasm of liver and intrahepatic bile duct: Secondary | ICD-10-CM | POA: Diagnosis not present

## 2019-01-21 DIAGNOSIS — Z85528 Personal history of other malignant neoplasm of kidney: Secondary | ICD-10-CM

## 2019-01-21 DIAGNOSIS — C7951 Secondary malignant neoplasm of bone: Secondary | ICD-10-CM | POA: Diagnosis not present

## 2019-01-21 DIAGNOSIS — Z5111 Encounter for antineoplastic chemotherapy: Secondary | ICD-10-CM | POA: Diagnosis not present

## 2019-01-21 LAB — CBC WITH DIFFERENTIAL (CANCER CENTER ONLY)
Abs Immature Granulocytes: 0.06 10*3/uL (ref 0.00–0.07)
Basophils Absolute: 0.1 10*3/uL (ref 0.0–0.1)
Basophils Relative: 1 %
Eosinophils Absolute: 0.1 10*3/uL (ref 0.0–0.5)
Eosinophils Relative: 2 %
HCT: 33.4 % — ABNORMAL LOW (ref 39.0–52.0)
Hemoglobin: 11 g/dL — ABNORMAL LOW (ref 13.0–17.0)
Immature Granulocytes: 1 %
Lymphocytes Relative: 9 %
Lymphs Abs: 0.5 10*3/uL — ABNORMAL LOW (ref 0.7–4.0)
MCH: 32.2 pg (ref 26.0–34.0)
MCHC: 32.9 g/dL (ref 30.0–36.0)
MCV: 97.7 fL (ref 80.0–100.0)
Monocytes Absolute: 0.7 10*3/uL (ref 0.1–1.0)
Monocytes Relative: 13 %
Neutro Abs: 3.9 10*3/uL (ref 1.7–7.7)
Neutrophils Relative %: 74 %
Platelet Count: 234 10*3/uL (ref 150–400)
RBC: 3.42 MIL/uL — ABNORMAL LOW (ref 4.22–5.81)
RDW: 15.4 % (ref 11.5–15.5)
WBC Count: 5.3 10*3/uL (ref 4.0–10.5)
nRBC: 0 % (ref 0.0–0.2)

## 2019-01-21 LAB — CMP (CANCER CENTER ONLY)
ALT: 20 U/L (ref 0–44)
AST: 16 U/L (ref 15–41)
Albumin: 4.1 g/dL (ref 3.5–5.0)
Alkaline Phosphatase: 81 U/L (ref 38–126)
Anion gap: 9 (ref 5–15)
BUN: 22 mg/dL (ref 8–23)
CO2: 25 mmol/L (ref 22–32)
Calcium: 10.2 mg/dL (ref 8.9–10.3)
Chloride: 99 mmol/L (ref 98–111)
Creatinine: 1.25 mg/dL — ABNORMAL HIGH (ref 0.61–1.24)
GFR, Est AFR Am: >60 mL/min (ref >60)
GFR, Estimated: 60 mL/min — ABNORMAL LOW (ref >60)
Glucose, Bld: 123 mg/dL — ABNORMAL HIGH (ref 70–99)
Potassium: 4.5 mmol/L (ref 3.5–5.1)
Sodium: 133 mmol/L — ABNORMAL LOW (ref 135–145)
Total Bilirubin: 0.3 mg/dL (ref 0.3–1.2)
Total Protein: 6.1 g/dL — ABNORMAL LOW (ref 6.5–8.1)

## 2019-01-21 MED ORDER — SODIUM CHLORIDE 0.9% FLUSH
10.0000 mL | Freq: Once | INTRAVENOUS | Status: AC
  Administered 2019-01-21: 10 mL
  Filled 2019-01-21: qty 10

## 2019-01-21 MED ORDER — HYDROCODONE-HOMATROPINE 5-1.5 MG/5ML PO SYRP: 5.0000 mL | ORAL_SOLUTION | ORAL | 0 refills | Status: DC | PRN

## 2019-01-21 MED ORDER — SODIUM CHLORIDE 0.9 % IV SOLN
Freq: Once | INTRAVENOUS | Status: AC
  Administered 2019-01-21: 09:00:00 via INTRAVENOUS
  Filled 2019-01-21: qty 250

## 2019-01-21 MED ORDER — SODIUM CHLORIDE 0.9 % IV SOLN
1200.0000 mg | Freq: Once | INTRAVENOUS | Status: AC
  Administered 2019-01-21: 1200 mg via INTRAVENOUS
  Filled 2019-01-21: qty 20

## 2019-01-21 MED ORDER — SODIUM CHLORIDE 0.9% FLUSH
10.0000 mL | INTRAVENOUS | Status: DC | PRN
  Administered 2019-01-21: 10 mL
  Filled 2019-01-21: qty 10

## 2019-01-21 MED ORDER — HEPARIN SOD (PORK) LOCK FLUSH 100 UNIT/ML IV SOLN
500.0000 [IU] | Freq: Once | INTRAVENOUS | Status: AC | PRN
  Administered 2019-01-21: 500 [IU]
  Filled 2019-01-21: qty 5

## 2019-01-21 MED FILL — HYDROCODONE-HOMATROPINE SOL: 5-1.5 | 10 days supply | Qty: 250 | Fill #0

## 2019-01-21 NOTE — Progress Notes (Signed)
Hematology and Oncology Follow Up Visit  Taylor Elliott [id number] 1951-05-17 68 y.o. 01/21/2019   Principle Diagnosis:   Extensive stage small cell lung cancer  SIADH secondary to small cell lung cancer  Current Therapy:    Carboplatinum/etoposide/Tecentriq-cycle #6  Atezolizumab 1200 mg IV q 3 week -- maintenance -  Start 01/21/2019       Interim History:  Taylor Elliott is back for follow-up.  He now starts his maintenance therapy with Tecentriq.  He had a good weekend.  He was at the [facility].  He had a good time.  He feels okay.  He still has a bit of a cough.  He is due for a PET scan next week.  I thought he would had a finality.  I am not sure why he did not have it.  I still feel that he is going to need radiation therapy to the right hilar mass.  I think this definitely would be helpful and would help decrease the risk of recurrence.  I am not sure about the PCI.  There is been no problems with bowels or bladder.  He has had no rashes.  He does have some issues with his hips.  He has some neuropathy in his right leg.  His appetite is doing quite good.  There is been no issues with bleeding.  Overall, his performance status is ECOG 1.    Medications:  Current Outpatient Medications:  .  allopurinol (ZYLOPRIM) 100 MG tablet, Take 2 tablets (200 mg total) by mouth every morning. (Patient taking differently: Take 100 mg by mouth daily. Pt taking 1 tablet daily), Disp: 180 tablet, Rfl: 3 .  amLODipine (NORVASC) 10 MG tablet, TAKE 1 TABLET BY MOUTH EVERY MORNING, Disp: 90 tablet, Rfl: 1 .  aspirin EC 81 MG tablet, Take 81 mg by mouth daily., Disp: , Rfl:  .  calcium carbonate (TUMS - DOSED IN MG ELEMENTAL CALCIUM) 500 MG chewable tablet, Chew 2 tablets by mouth daily as needed for indigestion or heartburn. , Disp: , Rfl:  .  diphenhydrAMINE (SOMINEX) 25 MG tablet, Take 25 mg by mouth at bedtime as needed for sleep., Disp: , Rfl:  .  EPINEPHrine 0.3 mg/0.3 mL IJ SOAJ  injection, Inject 0.3 mg into the muscle once., Disp: , Rfl:  .  fenofibrate 160 MG tablet, Take 1 tablet (160 mg total) by mouth daily., Disp: 90 tablet, Rfl: 1 .  HYDROcodone-acetaminophen (NORCO/VICODIN) 5-325 MG tablet, Take 1 tablet by mouth every 6 (six) hours as needed for moderate pain., Disp: 60 tablet, Rfl: 0 .  HYDROcodone-homatropine (HYCODAN) 5-1.5 MG/5ML syrup, Take 5 mLs by mouth every 4 (four) hours as needed for cough., Disp: 250 mL, Rfl: 0 .  Ipratropium-Albuterol (COMBIVENT) 20-100 MCG/ACT AERS respimat, Inhale 1 puff into the lungs every 6 (six) hours., Disp: 2 Inhaler, Rfl: 4 .  lidocaine-prilocaine (EMLA) cream, Apply to affected area once, Disp: 30 g, Rfl: 3 .  lisinopril (PRINIVIL,ZESTRIL) 20 MG tablet, TAKE 1 TABLET BY MOUTH ONCE DAILY, Disp: 90 tablet, Rfl: 1 .  LORazepam (ATIVAN) 0.5 MG tablet, Take 1 tablet (0.5 mg total) by mouth every 6 (six) hours as needed (Nausea or vomiting)., Disp: 30 tablet, Rfl: 0 .  magic mouthwash w/lidocaine SOLN, Take 5 mLs by mouth 3 (three) times daily as needed for mouth pain. benadryl 525 mg, hydrocortisone 60 mg and nystatin 0.6 mg., Disp: 240 mL, Rfl: 6 .  Multiple Vitamin (MULTIVITAMIN) tablet, Take 1 tablet by mouth daily.,  Disp: , Rfl:  .  ondansetron (ZOFRAN) 8 MG tablet, Take 1 tablet (8 mg total) by mouth 2 (two) times daily as needed for refractory nausea / vomiting. Start on day 3 after carboplatin chemo., Disp: 30 tablet, Rfl: 1 .  prochlorperazine (COMPAZINE) 10 MG tablet, Take 1 tablet (10 mg total) by mouth every 6 (six) hours as needed (Nausea or vomiting)., Disp: 30 tablet, Rfl: 1 .  Pyridoxine HCl (VITAMIN B-6) 500 MG tablet, Take 1 tablet (500 mg total) by mouth daily., Disp: 60 tablet, Rfl: 4 .  rosuvastatin (CRESTOR) 10 MG tablet, TAKE 1 TABLET BY MOUTH ONCE DAILY, Disp: 90 tablet, Rfl: 1 .  solifenacin (VESICARE) 10 MG tablet, Take 10 mg by mouth every evening., Disp: , Rfl:  .  UNABLE TO FIND, Med Name: Allergy  shots., Disp: , Rfl:  No current facility-administered medications for this visit.   Facility-Administered Medications Ordered in Other Visits:  .  atezolizumab (TECENTRIQ) 1,200 mg in sodium chloride 0.9 % 250 mL chemo infusion, 1,200 mg, Intravenous, Once, Taylor [place] Elliott, Taylor Elliott .  heparin lock flush 100 unit/mL, 500 Units, Intracatheter, Once PRN, Taylor Elliott, Taylor Cobb, Taylor Elliott .  sodium chloride flush (NS) 0.9 % injection 10 mL, 10 mL, Intracatheter, PRN, Volanda Napoleon, Taylor Elliott  Allergies:  Allergies  Allergen Reactions  . Bee Venom Anaphylaxis  . Clindamycin/Lincomycin Hives    Past Medical History, Surgical history, Social history, and Family History were reviewed and updated.  Review of Systems: Review of Systems  Constitutional: Negative.   HENT:  Negative.   Eyes: Negative.   Respiratory: Positive for cough.   Cardiovascular: Negative.   Gastrointestinal: Negative.   Endocrine: Negative.   Genitourinary: Negative.    Musculoskeletal: Positive for back pain, myalgias and neck pain.  Skin: Positive for itching and rash.  Neurological: Negative.   Hematological: Negative.   Psychiatric/Behavioral: Negative.     Physical Exam:  vitals were not taken for this visit.   Wt Readings from Last 3 Encounters:  01/20/19 208 lb (94.3 kg)  12/31/18 208 lb 1.3 oz (94.4 kg)  12/31/18 208 lb (94.3 kg)    Physical Exam Vitals signs reviewed.  HENT:     Head: Normocephalic and atraumatic.  Eyes:     Pupils: Pupils are equal, round, and reactive to light.  Neck:     Musculoskeletal: Normal range of motion.  Cardiovascular:     Rate and Rhythm: Normal rate and regular rhythm.     Heart sounds: Normal heart sounds.  Pulmonary:     Effort: Pulmonary effort is normal.     Breath sounds: Normal breath sounds.  Abdominal:     General: Bowel sounds are normal.     Palpations: Abdomen is soft.     Comments: Abdominal exam shows a slightly obese abdomen.  He does have an umbilical hernia.   I really cannot palpate his liver at this point.  There is no fluid wave.  There is no inguinal adenopathy.  There is no splenomegaly.    Musculoskeletal: Normal range of motion.        General: No tenderness or deformity.  Lymphadenopathy:     Cervical: No cervical adenopathy.  Skin:    General: Skin is warm and dry.     Findings: No erythema or rash.  Neurological:     Mental Status: He is alert and oriented to person, place, and time.  Psychiatric:        Behavior: Behavior normal.  Thought Content: Thought content normal.        Judgment: Judgment normal.      Lab Results  Component Value Date   WBC 5.3 01/21/2019   HGB 11.0 (L) 01/21/2019   HCT 33.4 (L) 01/21/2019   MCV 97.7 01/21/2019   PLT 234 01/21/2019     Chemistry      Component Value Date/Time   NA 133 (L) 01/21/2019 0750   NA 135 (L) 07/12/2015 0813   K 4.5 01/21/2019 0750   K 4.5 07/12/2015 0813   CL 99 01/21/2019 0750   CO2 25 01/21/2019 0750   CO2 24 07/12/2015 0813   BUN 22 01/21/2019 0750   BUN 15.7 07/12/2015 0813   CREATININE 1.25 (H) 01/21/2019 0750   CREATININE 1.6 (H) 07/12/2015 0813      Component Value Date/Time   CALCIUM 10.2 01/21/2019 0750   CALCIUM 10.1 07/12/2015 0813   ALKPHOS 81 01/21/2019 0750   ALKPHOS 115 07/12/2015 0813   AST 16 01/21/2019 0750   AST 22 07/12/2015 0813   ALT 20 01/21/2019 0750   ALT 24 07/12/2015 0813   BILITOT 0.3 01/21/2019 0750   BILITOT 0.46 07/12/2015 0813       Impression and Plan: Mr. Creighton is a 68 year old white male.  He has a past history of renal cell carcinoma and also prostate cancer.  Now, he has a third malignancy.  This is metastatic small cell lung cancer.  We now are doing maintenance immunotherapy.  Again, which she will had his PET scan now.  Unless her one is scheduled for 2 weeks.  We will still call radiation therapy.  I will have Dr. Sondra Come look at his past scans.  We will have him come back in 3 weeks for his next cycle  of immunotherapy.  Volanda Napoleon, Taylor Elliott 6/24/20208:46 AM

## 2019-01-21 NOTE — Patient Instructions (Addendum)
[facility] Discharge Instructions for Patients Receiving Chemotherapy  Today you received the following chemotherapy agents Tecentriq To help prevent nausea and vomiting after your treatment, we encourage you to take your nausea medication as prescribed.   If you develop nausea and vomiting that is not controlled by your nausea medication, call the clinic.   BELOW ARE SYMPTOMS THAT SHOULD BE REPORTED IMMEDIATELY:  *FEVER GREATER THAN 100.5 F  *CHILLS WITH OR WITHOUT FEVER  NAUSEA AND VOMITING THAT IS NOT CONTROLLED WITH YOUR NAUSEA MEDICATION  *UNUSUAL SHORTNESS OF BREATH  *UNUSUAL BRUISING OR BLEEDING  TENDERNESS IN MOUTH AND THROAT WITH OR WITHOUT PRESENCE OF ULCERS  *URINARY PROBLEMS  *BOWEL PROBLEMS  UNUSUAL RASH Items with * indicate a potential emergency and should be followed up as soon as possible.  Feel free to call the clinic should you have any questions or concerns. The clinic phone number is [phone].  Please show the [facility] at check-in to the Emergency Department and triage nurse.  Atezolizumab injection What is this medicine? ATEZOLIZUMAB (a te zoe LIZ ue mab) is a monoclonal antibody. It is used to treat bladder cancer (urothelial cancer), non-small cell lung cancer, small cell lung cancer, and breast cancer. This medicine may be used for other purposes; ask your health care provider or pharmacist if you have questions. COMMON BRAND NAME(S): Tecentriq What should I tell my health care provider before I take this medicine? They need to know if you have any of these conditions: -diabetes -immune system problems -infection -inflammatory bowel disease -liver disease -lung or breathing disease -lupus -nervous system problems like myasthenia gravis or Guillain-Barre syndrome -organ transplant -an unusual or allergic reaction to atezolizumab, other medicines, foods, dyes, or preservatives -pregnant or trying to get  pregnant -breast-feeding How should I use this medicine? This medicine is for infusion into a vein. It is given by a health care professional in a hospital or clinic setting. A special MedGuide will be given to you before each treatment. Be sure to read this information carefully each time. Talk to your pediatrician regarding the use of this medicine in children. Special care may be needed. Overdosage: If you think you have taken too much of this medicine contact a poison control center or emergency room at once. NOTE: This medicine is only for you. Do not share this medicine with others. What if I miss a dose? It is important not to miss your dose. Call your doctor or health care professional if you are unable to keep an appointment. What may interact with this medicine? Interactions have not been studied. This list may not describe all possible interactions. Give your health care provider a list of all the medicines, herbs, non-prescription drugs, or dietary supplements you use. Also tell them if you smoke, drink alcohol, or use illegal drugs. Some items may interact with your medicine. What should I watch for while using this medicine? Your condition will be monitored carefully while you are receiving this medicine. You may need blood work done while you are taking this medicine. Do not become pregnant while taking this medicine or for at least 5 months after stopping it. Women should inform their doctor if they wish to become pregnant or think they might be pregnant. There is a potential for serious side effects to an unborn child. Talk to your health care professional or pharmacist for more information. Do not breast-feed an infant while taking this medicine or for at least 5 months after  the last dose. What side effects may I notice from receiving this medicine? Side effects that you should report to your doctor or health care professional as soon as possible: -allergic reactions like skin  rash, itching or hives, swelling of the face, lips, or tongue -black, tarry stools -bloody or watery diarrhea -breathing problems -changes in vision -chest pain or chest tightness -chills -facial flushing -fever -headache -signs and symptoms of high blood sugar such as dizziness; dry mouth; dry skin; fruity breath; nausea; stomach pain; increased hunger or thirst; increased urination -signs and symptoms of liver injury like dark yellow or brown urine; general ill feeling or flu-like symptoms; light-colored stools; loss of appetite; nausea; right upper belly pain; unusually weak or tired; yellowing of the eyes or skin -stomach pain -trouble passing urine or change in the amount of urine Side effects that usually do not require medical attention (report to your doctor or health care professional if they continue or are bothersome): -cough -diarrhea -joint pain -muscle pain -muscle weakness -tiredness -weight loss This list may not describe all possible side effects. Call your doctor for medical advice about side effects. You may report side effects to FDA at 1-800-FDA-1088. Where should I keep my medicine? This drug is given in a hospital or clinic and will not be stored at home. NOTE: This sheet is a summary. It may not cover all possible information. If you have questions about this medicine, talk to your doctor, pharmacist, or health care provider.  2019 Elsevier/Gold Standard (2017-10-18 09:33:38)

## 2019-01-21 NOTE — Patient Instructions (Signed)

## 2019-01-26 DIAGNOSIS — T63451D Toxic effect of venom of hornets, accidental (unintentional), subsequent encounter: Secondary | ICD-10-CM | POA: Diagnosis not present

## 2019-01-26 DIAGNOSIS — T63461D Toxic effect of venom of wasps, accidental (unintentional), subsequent encounter: Secondary | ICD-10-CM | POA: Diagnosis not present

## 2019-01-26 MED FILL — LIDOCAINE-PRILOCAINE CREAM: 2.5-2.5 | 14 days supply | Qty: 30 | Fill #1

## 2019-02-02 ENCOUNTER — Other Ambulatory Visit: Payer: Self-pay

## 2019-02-02 ENCOUNTER — Encounter (HOSPITAL_COMMUNITY)
Admission: RE | Admit: 2019-02-02 | Discharge: 2019-02-02 | Disposition: A | Discharge status: Home / Self Care | Payer: 59 | Source: Ambulatory Visit | Attending: Hematology & Oncology | Admitting: Hematology & Oncology

## 2019-02-02 DIAGNOSIS — C3491 Malignant neoplasm of unspecified part of right bronchus or lung: Secondary | ICD-10-CM | POA: Insufficient documentation

## 2019-02-02 LAB — GLUCOSE, CAPILLARY: Glucose-Capillary: 114 mg/dL — ABNORMAL HIGH (ref 70–99)

## 2019-02-02 MED ORDER — FLUDEOXYGLUCOSE F - 18 (FDG) INJECTION
10.2800 | Freq: Once | INTRAVENOUS | Status: AC | PRN
  Administered 2019-02-02: 10.28 via INTRAVENOUS

## 2019-02-03 ENCOUNTER — Encounter: Payer: Self-pay | Admitting: *Deleted

## 2019-02-03 NOTE — Progress Notes (Signed)
Thoracic Location of Tumor / Histology: Principle Diagnosis:   Extensive stage small cell lung cancer SIADH secondary to small cell lung cancer   Biopsies revealed: 09/01/18:  Diagnosis Liver, needle/core biopsy - SMALL CELL CARCINOMA. SEE COMMENT. Microscopic Comment Immunohistochemistry for CK AE1/AE3, CK 8/18, Synaptophysin and TTF-1 is positive. Ki-67 labeling index reaches 80-90%. CDX-2, PAX 8, PSA and Prostein are negative. The immunophenotype is compatible with origin from lung; however, it does not exclude origin from other entities.  Tobacco/Marijuana/Snuff/ETOH use: Former heavy smoker. Pt continues to smoke 4-5 cigarettes/day plus e-cigarettes.  Pt reports "a couple beers or a glass or 2 of wine" per day. Pt denies illicit drug use.  Past/Anticipated interventions by cardiothoracic surgery, if any: None at this time.  Past/Anticipated interventions by medical oncology, if any: 01/21/19 Per Dr. Marin Olp:  Impression and Plan: Taylor Elliott is a 68 year old white male.  He has a past history of renal cell carcinoma and also prostate cancer.  Now, he has a third malignancy.  This is metastatic small cell lung cancer.  We now are doing maintenance immunotherapy.  Again, which she will had his PET scan now.  Unless her one is scheduled for 2 weeks.  We will still call radiation therapy.  I will have Dr. Sondra Come look at his past scans.  We will have him come back in 3 weeks for his next cycle of immunotherapy.  Taylor Napoleon, MD 6/24/20208:46 AM  Signs/Symptoms Weight changes, if any:  Wt Readings from Last 3 Encounters:  02/05/19 208 lb 6.4 oz (94.5 kg)  01/20/19 208 lb (94.3 kg)  12/31/18 208 lb 1.3 oz (94.4 kg)       Respiratory complaints, if any: Pt reports occasional cough with clear sputum. Pt reports occasional SOB with exertion. Pt denies difficulty breathing.  Hemoptysis, if any: Pt denies.  Pain issues, if any:  Pt denies cancer-related pain.  SAFETY  ISSUES:  Prior radiation? No  Pacemaker/ICD? No  Possible current pregnancy? N/A  Is the patient on methotrexate? No  Current Complaints / other details:  Pt presents today for initial consult with Dr. Sondra Come for Radiation Oncology. Pt's wife is Furniture conservator/restorer with [employer].  BP (!) 155/73 (BP Location: Left Arm, Patient Position: Sitting)   Pulse 80   Temp 98.6 F (37 C) (Oral)   Resp 20   Ht _0  (1.727 m)   Wt 208 lb 6.4 oz (94.5 kg)   SpO2 100%   BMI 31.69 kg/m   Loma Sousa, RN BSN

## 2019-02-03 NOTE — Progress Notes (Signed)
Called patient with PET scan results per Dr Antonieta Pert request below.  Taylor Napoleon, MD  P Onc Nurse Hp        Call - you are now in remission!!! NO activity noted in the lung mass now!!! The XRT will be very helpful!!! [name]

## 2019-02-05 ENCOUNTER — Ambulatory Visit
Admission: RE | Admit: 2019-02-05 | Discharge: 2019-02-05 | Disposition: A | Discharge status: Home / Self Care | Payer: 59 | Source: Ambulatory Visit | Attending: Radiation Oncology | Admitting: Radiation Oncology

## 2019-02-05 ENCOUNTER — Other Ambulatory Visit: Payer: Self-pay

## 2019-02-05 ENCOUNTER — Other Ambulatory Visit: Payer: Self-pay | Admitting: Hematology & Oncology

## 2019-02-05 ENCOUNTER — Encounter: Payer: Self-pay | Admitting: Radiation Oncology

## 2019-02-05 VITALS — BP 155/73 | HR 80 | Temp 98.6°F | Resp 20 | Ht 68.0 in | Wt 208.4 lb

## 2019-02-05 DIAGNOSIS — C61 Malignant neoplasm of prostate: Secondary | ICD-10-CM | POA: Diagnosis not present

## 2019-02-05 DIAGNOSIS — Z9221 Personal history of antineoplastic chemotherapy: Secondary | ICD-10-CM | POA: Insufficient documentation

## 2019-02-05 DIAGNOSIS — F419 Anxiety disorder, unspecified: Secondary | ICD-10-CM | POA: Diagnosis not present

## 2019-02-05 DIAGNOSIS — Z905 Acquired absence of kidney: Secondary | ICD-10-CM | POA: Insufficient documentation

## 2019-02-05 DIAGNOSIS — I10 Essential (primary) hypertension: Secondary | ICD-10-CM | POA: Insufficient documentation

## 2019-02-05 DIAGNOSIS — C778 Secondary and unspecified malignant neoplasm of lymph nodes of multiple regions: Secondary | ICD-10-CM | POA: Insufficient documentation

## 2019-02-05 DIAGNOSIS — C3491 Malignant neoplasm of unspecified part of right bronchus or lung: Secondary | ICD-10-CM

## 2019-02-05 DIAGNOSIS — E7800 Pure hypercholesterolemia, unspecified: Secondary | ICD-10-CM | POA: Diagnosis not present

## 2019-02-05 DIAGNOSIS — N189 Chronic kidney disease, unspecified: Secondary | ICD-10-CM | POA: Diagnosis not present

## 2019-02-05 DIAGNOSIS — Z85528 Personal history of other malignant neoplasm of kidney: Secondary | ICD-10-CM | POA: Insufficient documentation

## 2019-02-05 DIAGNOSIS — K219 Gastro-esophageal reflux disease without esophagitis: Secondary | ICD-10-CM | POA: Insufficient documentation

## 2019-02-05 DIAGNOSIS — Z79899 Other long term (current) drug therapy: Secondary | ICD-10-CM | POA: Diagnosis not present

## 2019-02-05 DIAGNOSIS — Z9079 Acquired absence of other genital organ(s): Secondary | ICD-10-CM | POA: Diagnosis not present

## 2019-02-05 DIAGNOSIS — C7951 Secondary malignant neoplasm of bone: Secondary | ICD-10-CM | POA: Insufficient documentation

## 2019-02-05 DIAGNOSIS — J439 Emphysema, unspecified: Secondary | ICD-10-CM | POA: Diagnosis not present

## 2019-02-05 DIAGNOSIS — Z8546 Personal history of malignant neoplasm of prostate: Secondary | ICD-10-CM | POA: Diagnosis not present

## 2019-02-05 DIAGNOSIS — C787 Secondary malignant neoplasm of liver and intrahepatic bile duct: Secondary | ICD-10-CM | POA: Diagnosis not present

## 2019-02-05 DIAGNOSIS — D4702 Systemic mastocytosis: Secondary | ICD-10-CM | POA: Diagnosis not present

## 2019-02-05 DIAGNOSIS — M129 Arthropathy, unspecified: Secondary | ICD-10-CM | POA: Diagnosis not present

## 2019-02-05 DIAGNOSIS — F17210 Nicotine dependence, cigarettes, uncomplicated: Secondary | ICD-10-CM | POA: Diagnosis not present

## 2019-02-05 DIAGNOSIS — Z7982 Long term (current) use of aspirin: Secondary | ICD-10-CM | POA: Insufficient documentation

## 2019-02-05 DIAGNOSIS — I129 Hypertensive chronic kidney disease with stage 1 through stage 4 chronic kidney disease, or unspecified chronic kidney disease: Secondary | ICD-10-CM | POA: Insufficient documentation

## 2019-02-05 DIAGNOSIS — C3401 Malignant neoplasm of right main bronchus: Secondary | ICD-10-CM | POA: Insufficient documentation
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
## 2019-02-05 NOTE — Patient Instructions (Signed)
Coronavirus (COVID-19) Are you at risk?  Are you at risk for the Coronavirus (COVID-19)?  To be considered HIGH RISK for Coronavirus (COVID-19), you have to meet the following criteria:  . Traveled to [place], [place], [place], [place] or [place]; or in the [place] to [place], [place], [place], or [place]; and have fever, cough, and shortness of breath within the last 2 weeks of travel OR . Been in close contact with a person diagnosed with COVID-19 within the last 2 weeks and have fever, cough, and shortness of breath . IF YOU DO NOT MEET THESE CRITERIA, YOU ARE CONSIDERED LOW RISK FOR COVID-19.  What to do if you are HIGH RISK for COVID-19?  . If you are having a medical emergency, call 911. . Seek medical care right away. Before you go to a doctor's office, urgent care or emergency department, call ahead and tell them about your recent travel, contact with someone diagnosed with COVID-19, and your symptoms. You should receive instructions from your physician's office regarding next steps of care.  . When you arrive at healthcare provider, tell the healthcare staff immediately you have returned from visiting [place], [place], [place], [place] or [place]; or traveled in the [place] to [place], [place], [place], or [place]; in the last two weeks or you have been in close contact with a person diagnosed with COVID-19 in the last 2 weeks.   . Tell the health care staff about your symptoms: fever, cough and shortness of breath. . After you have been seen by a medical provider, you will be either: o Tested for (COVID-19) and discharged home on quarantine except to seek medical care if symptoms worsen, and asked to  - Stay home and avoid contact with others until you get your results (4-5 days)  - Avoid travel on public transportation if possible (such as bus, train, or airplane) or o Sent to the Emergency Department by EMS for evaluation, COVID-19 testing, and possible  admission depending on your condition and test results.  What to do if you are LOW RISK for COVID-19?  Reduce your risk of any infection by using the same precautions used for avoiding the common cold or flu:  . Wash your hands often with soap and warm water for at least 20 seconds.  If soap and water are not readily available, use an alcohol-based hand sanitizer with at least 60% alcohol.  . If coughing or sneezing, cover your mouth and nose by coughing or sneezing into the elbow areas of your shirt or coat, into a tissue or into your sleeve (not your hands). . Avoid shaking hands with others and consider head nods or verbal greetings only. . Avoid touching your eyes, nose, or mouth with unwashed hands.  . Avoid close contact with people who are sick. . Avoid places or events with large numbers of people in one location, like concerts or sporting events. . Carefully consider travel plans you have or are making. . If you are planning any travel outside or inside the [place], visit the CDC's Travelers' Health webpage for the latest health notices. . If you have some symptoms but not all symptoms, continue to monitor at home and seek medical attention if your symptoms worsen. . If you are having a medical emergency, call 911.   ADDITIONAL HEALTHCARE OPTIONS FOR PATIENTS  [facility] / e-Visit: [email or web address]         [facility]: [phone]  [facility]: [phone]                   [facility]: [phone]   

## 2019-02-06 DIAGNOSIS — Z91030 Bee allergy status: Secondary | ICD-10-CM | POA: Diagnosis not present

## 2019-02-06 DIAGNOSIS — M21371 Foot drop, right foot: Secondary | ICD-10-CM | POA: Diagnosis not present

## 2019-02-06 DIAGNOSIS — T63441D Toxic effect of venom of bees, accidental (unintentional), subsequent encounter: Secondary | ICD-10-CM | POA: Diagnosis not present

## 2019-02-10 MED FILL — SOLIFENACIN SUCCINATE 10 MG: 10 | 90 days supply | Qty: 90 | Fill #1

## 2019-02-11 ENCOUNTER — Telehealth: Payer: Self-pay | Admitting: Hematology & Oncology

## 2019-02-11 ENCOUNTER — Inpatient Hospital Stay: Payer: 59 | Attending: Hematology & Oncology | Admitting: Family

## 2019-02-11 ENCOUNTER — Inpatient Hospital Stay: Payer: 59

## 2019-02-11 ENCOUNTER — Encounter: Payer: Self-pay | Admitting: Family

## 2019-02-11 ENCOUNTER — Other Ambulatory Visit: Payer: Self-pay

## 2019-02-11 VITALS — BP 140/69 | HR 88 | Temp 97.5°F | Resp 17 | Ht 68.0 in | Wt 206.1 lb

## 2019-02-11 DIAGNOSIS — C3491 Malignant neoplasm of unspecified part of right bronchus or lung: Secondary | ICD-10-CM | POA: Insufficient documentation

## 2019-02-11 DIAGNOSIS — C787 Secondary malignant neoplasm of liver and intrahepatic bile duct: Secondary | ICD-10-CM | POA: Insufficient documentation

## 2019-02-11 DIAGNOSIS — Z5112 Encounter for antineoplastic immunotherapy: Secondary | ICD-10-CM | POA: Diagnosis not present

## 2019-02-11 DIAGNOSIS — K5900 Constipation, unspecified: Secondary | ICD-10-CM

## 2019-02-11 DIAGNOSIS — R0602 Shortness of breath: Secondary | ICD-10-CM

## 2019-02-11 DIAGNOSIS — J439 Emphysema, unspecified: Secondary | ICD-10-CM

## 2019-02-11 DIAGNOSIS — Z8546 Personal history of malignant neoplasm of prostate: Secondary | ICD-10-CM | POA: Diagnosis not present

## 2019-02-11 DIAGNOSIS — C7951 Secondary malignant neoplasm of bone: Secondary | ICD-10-CM | POA: Diagnosis not present

## 2019-02-11 DIAGNOSIS — Z85528 Personal history of other malignant neoplasm of kidney: Secondary | ICD-10-CM

## 2019-02-11 DIAGNOSIS — R05 Cough: Secondary | ICD-10-CM

## 2019-02-11 DIAGNOSIS — R059 Cough, unspecified: Secondary | ICD-10-CM

## 2019-02-11 LAB — CBC WITH DIFFERENTIAL (CANCER CENTER ONLY)
Abs Immature Granulocytes: 0.02 10*3/uL (ref 0.00–0.07)
Basophils Absolute: 0.1 10*3/uL (ref 0.0–0.1)
Basophils Relative: 1 %
Eosinophils Absolute: 0.2 10*3/uL (ref 0.0–0.5)
Eosinophils Relative: 4 %
HCT: 35.5 % — ABNORMAL LOW (ref 39.0–52.0)
Hemoglobin: 11.8 g/dL — ABNORMAL LOW (ref 13.0–17.0)
Immature Granulocytes: 0 %
Lymphocytes Relative: 10 %
Lymphs Abs: 0.5 10*3/uL — ABNORMAL LOW (ref 0.7–4.0)
MCH: 32.2 pg (ref 26.0–34.0)
MCHC: 33.2 g/dL (ref 30.0–36.0)
MCV: 96.7 fL (ref 80.0–100.0)
Monocytes Absolute: 0.6 10*3/uL (ref 0.1–1.0)
Monocytes Relative: 11 %
Neutro Abs: 4 10*3/uL (ref 1.7–7.7)
Neutrophils Relative %: 74 %
Platelet Count: 192 10*3/uL (ref 150–400)
RBC: 3.67 MIL/uL — ABNORMAL LOW (ref 4.22–5.81)
RDW: 13.9 % (ref 11.5–15.5)
WBC Count: 5.4 10*3/uL (ref 4.0–10.5)
nRBC: 0 % (ref 0.0–0.2)

## 2019-02-11 LAB — LACTATE DEHYDROGENASE: LDH: 132 U/L (ref 98–192)

## 2019-02-11 LAB — CMP (CANCER CENTER ONLY)
ALT: 19 U/L (ref 0–44)
AST: 16 U/L (ref 15–41)
Albumin: 3.9 g/dL (ref 3.5–5.0)
Alkaline Phosphatase: 71 U/L (ref 38–126)
Anion gap: 7 (ref 5–15)
BUN: 20 mg/dL (ref 8–23)
CO2: 25 mmol/L (ref 22–32)
Calcium: 8.4 mg/dL — ABNORMAL LOW (ref 8.9–10.3)
Chloride: 98 mmol/L (ref 98–111)
Creatinine: 1.16 mg/dL (ref 0.61–1.24)
GFR, Est AFR Am: >60 mL/min (ref >60)
GFR, Estimated: >60 mL/min (ref >60)
Glucose, Bld: 113 mg/dL — ABNORMAL HIGH (ref 70–99)
Potassium: 4.3 mmol/L (ref 3.5–5.1)
Sodium: 130 mmol/L — ABNORMAL LOW (ref 135–145)
Total Bilirubin: 0.4 mg/dL (ref 0.3–1.2)
Total Protein: 6.1 g/dL — ABNORMAL LOW (ref 6.5–8.1)

## 2019-02-11 MED ORDER — SODIUM CHLORIDE 0.9 % IV SOLN
Freq: Once | INTRAVENOUS | Status: AC
  Administered 2019-02-11: 09:00:00 via INTRAVENOUS
  Filled 2019-02-11: qty 250

## 2019-02-11 MED ORDER — SODIUM CHLORIDE 0.9 % IV SOLN
1200.0000 mg | Freq: Once | INTRAVENOUS | Status: AC
  Administered 2019-02-11: 1200 mg via INTRAVENOUS
  Filled 2019-02-11: qty 20

## 2019-02-11 MED ORDER — HEPARIN SOD (PORK) LOCK FLUSH 100 UNIT/ML IV SOLN
500.0000 [IU] | Freq: Once | INTRAVENOUS | Status: AC | PRN
  Administered 2019-02-11: 500 [IU]
  Filled 2019-02-11: qty 5

## 2019-02-11 MED ORDER — LACTULOSE 10 GM/15ML PO SOLN: 10.0000 g | Freq: Two times a day (BID) | ORAL | 1 refills | Status: AC | PRN

## 2019-02-11 MED ORDER — SODIUM CHLORIDE 0.9% FLUSH
10.0000 mL | INTRAVENOUS | Status: DC | PRN
  Administered 2019-02-11: 10 mL
  Filled 2019-02-11: qty 10

## 2019-02-11 MED ORDER — HYDROCODONE-HOMATROPINE 5-1.5 MG/5ML PO SYRP: 5.0000 mL | ORAL_SOLUTION | ORAL | 0 refills | Status: DC | PRN

## 2019-02-11 MED ORDER — LORAZEPAM 0.5 MG PO TABS: 0.5000 mg | ORAL_TABLET | Freq: Every day | ORAL | 0 refills | Status: DC

## 2019-02-11 MED FILL — LORazepam 0.5 MG TABS: 0.5 | 30 days supply | Qty: 30 | Fill #0

## 2019-02-11 MED FILL — HYDROCODONE-HOMATROPINE SOL: 5-1.5 | 10 days supply | Qty: 250 | Fill #0

## 2019-02-11 MED FILL — ENULOSE 10 GM/15 ML SOLN: 10 | 16 days supply | Qty: 473 | Fill #0

## 2019-02-11 NOTE — Telephone Encounter (Signed)
Called and advised patient of appointment for MRI date/time/location was given

## 2019-02-11 NOTE — Telephone Encounter (Signed)
Has current schedule but will need an MRI in the next week or two please. Thank you/ per 7/15 los

## 2019-02-11 NOTE — Patient Instructions (Signed)
Implanted [facility] Guide An implanted port is a device that is placed under the skin. It is usually placed in the chest. The device can be used to give IV medicine, to take blood, or for dialysis. You may have an implanted port if:  You need IV medicine that would be irritating to the small veins in your hands or arms.  You need IV medicines, such as antibiotics, for a long period of time.  You need IV nutrition for a long period of time.  You need dialysis. Having a port means that your health care provider will not need to use the veins in your arms for these procedures. You may have fewer limitations when using a port than you would if you used other types of long-term IVs, and you will likely be able to return to normal activities after your incision heals. An implanted port has two main parts:  Reservoir. The reservoir is the part where a needle is inserted to give medicines or draw blood. The reservoir is round. After it is placed, it appears as a small, raised area under your skin.  Catheter. The catheter is a thin, flexible tube that connects the reservoir to a vein. Medicine that is inserted into the reservoir goes into the catheter and then into the vein. How is my port accessed? To access your port:  A numbing cream may be placed on the skin over the port site.  Your health care provider will put on a mask and sterile gloves.  The skin over your port will be cleaned carefully with a germ-killing soap and allowed to dry.  Your health care provider will gently pinch the port and insert a needle into it.  Your health care provider will check for a blood return to make sure the port is in the vein and is not clogged.  If your port needs to remain accessed to get medicine continuously (constant infusion), your health care provider will place a clear bandage (dressing) over the needle site. The dressing and needle will need to be changed every week, or as told by your health care  provider. What is flushing? Flushing helps keep the port from getting clogged. Follow instructions from your health care provider about how and when to flush the port. Ports are usually flushed with saline solution or a medicine called heparin. The need for flushing will depend on how the port is used:  If the port is only used from time to time to give medicines or draw blood, the port may need to be flushed: ? Before and after medicines have been given. ? Before and after blood has been drawn. ? As part of routine maintenance. Flushing may be recommended every 4-6 weeks.  If a constant infusion is running, the port may not need to be flushed.  Throw away any syringes in a disposal container that is meant for sharp items (sharps container). You can buy a sharps container from a pharmacy, or you can make one by using an empty hard plastic bottle with a cover. How long will my port stay implanted? The port can stay in for as long as your health care provider thinks it is needed. When it is time for the port to come out, a surgery will be done to remove it. The surgery will be similar to the procedure that was done to put the port in. Follow these instructions at home:   Flush your port as told by your health care provider.  If you need an infusion over several days, follow instructions from your health care provider about how to take care of your port site. Make sure you: ? Wash your hands with soap and water before you change your dressing. If soap and water are not available, use alcohol-based hand sanitizer. ? Change your dressing as told by your health care provider. ? Place any used dressings or infusion bags into a plastic bag. Throw that bag in the trash. ? Keep the dressing that covers the needle clean and dry. Do not get it wet. ? Do not use scissors or sharp objects near the tube. ? Keep the tube clamped, unless it is being used.  Check your port site every day for signs of  infection. Check for: ? Redness, swelling, or pain. ? Fluid or blood. ? Pus or a bad smell.  Protect the skin around the port site. ? Avoid wearing bra straps that rub or irritate the site. ? Protect the skin around your port from seat belts. Place a soft pad over your chest if needed.  Bathe or shower as told by your health care provider. The site may get wet as long as you are not actively receiving an infusion.  Return to your normal activities as told by your health care provider. Ask your health care provider what activities are safe for you.  Carry a medical alert card or wear a medical alert bracelet at all times. This will let health care providers know that you have an implanted port in case of an emergency. Get help right away if:  You have redness, swelling, or pain at the port site.  You have fluid or blood coming from your port site.  You have pus or a bad smell coming from the port site.  You have a fever. Summary  Implanted ports are usually placed in the chest for long-term IV access.  Follow instructions from your health care provider about flushing the port and changing bandages (dressings).  Take care of the area around your port by avoiding clothing that puts pressure on the area, and by watching for signs of infection.  Protect the skin around your port from seat belts. Place a soft pad over your chest if needed.  Get help right away if you have a fever or you have redness, swelling, pain, drainage, or a bad smell at the port site. This information is not intended to replace advice given to you by your health care provider. Make sure you discuss any questions you have with your health care provider. Document Released: 07/16/2005 Document Revised: 11/07/2018 Document Reviewed: 08/18/2016 Elsevier Patient Education  2020 [employer].

## 2019-02-11 NOTE — Patient Instructions (Signed)
Atezolizumab injection What is this medicine? ATEZOLIZUMAB (a te zoe LIZ ue mab) is a monoclonal antibody. It is used to treat bladder cancer (urothelial cancer), non-small cell lung cancer, small cell lung cancer, and breast cancer. This medicine may be used for other purposes; ask your health care provider or pharmacist if you have questions. COMMON BRAND NAME(S): Tecentriq What should I tell my health care provider before I take this medicine? They need to know if you have any of these conditions:  diabetes  immune system problems  infection  inflammatory bowel disease  liver disease  lung or breathing disease  lupus  nervous system problems like myasthenia gravis or Guillain-Barre syndrome  organ transplant  an unusual or allergic reaction to atezolizumab, other medicines, foods, dyes, or preservatives  pregnant or trying to get pregnant  breast-feeding How should I use this medicine? This medicine is for infusion into a vein. It is given by a health care professional in a hospital or clinic setting. A special MedGuide will be given to you before each treatment. Be sure to read this information carefully each time. Talk to your pediatrician regarding the use of this medicine in children. Special care may be needed. Overdosage: If you think you have taken too much of this medicine contact a poison control center or emergency room at once. NOTE: This medicine is only for you. Do not share this medicine with others. What if I miss a dose? It is important not to miss your dose. Call your doctor or health care professional if you are unable to keep an appointment. What may interact with this medicine? Interactions have not been studied. This list may not describe all possible interactions. Give your health care provider a list of all the medicines, herbs, non-prescription drugs, or dietary supplements you use. Also tell them if you smoke, drink alcohol, or use illegal drugs.  Some items may interact with your medicine. What should I watch for while using this medicine? Your condition will be monitored carefully while you are receiving this medicine. You may need blood work done while you are taking this medicine. Do not become pregnant while taking this medicine or for at least 5 months after stopping it. Women should inform their doctor if they wish to become pregnant or think they might be pregnant. There is a potential for serious side effects to an unborn child. Talk to your health care professional or pharmacist for more information. Do not breast-feed an infant while taking this medicine or for at least 5 months after the last dose. What side effects may I notice from receiving this medicine? Side effects that you should report to your doctor or health care professional as soon as possible:  allergic reactions like skin rash, itching or hives, swelling of the face, lips, or tongue  black, tarry stools  bloody or watery diarrhea  breathing problems  changes in vision  chest pain or chest tightness  chills  facial flushing  fever  headache  signs and symptoms of high blood sugar such as dizziness; dry mouth; dry skin; fruity breath; nausea; stomach pain; increased hunger or thirst; increased urination  signs and symptoms of liver injury like dark yellow or brown urine; general ill feeling or flu-like symptoms; light-colored stools; loss of appetite; nausea; right upper belly pain; unusually weak or tired; yellowing of the eyes or skin  stomach pain  trouble passing urine or change in the amount of urine Side effects that usually do not require medical   attention (report to your doctor or health care professional if they continue or are bothersome):  cough  diarrhea  joint pain  muscle pain  muscle weakness  tiredness  weight loss This list may not describe all possible side effects. Call your doctor for medical advice about side  effects. You may report side effects to FDA at 1-800-FDA-1088. Where should I keep my medicine? This drug is given in a hospital or clinic and will not be stored at home. NOTE: This sheet is a summary. It may not cover all possible information. If you have questions about this medicine, talk to your doctor, pharmacist, or health care provider.  2020 Elsevier/Gold Standard (2017-10-18 09:33:38)  

## 2019-02-11 NOTE — Progress Notes (Signed)
Hematology and Oncology Follow Up Visit  JACODY BENEKE [id number] 1950-10-21 68 y.o. 02/11/2019   Principle Diagnosis:  Extensive stage small cell lung cancer SIADH secondary to small cell lung cancer  Past Therapy: Carboplatinum/etoposide/Tecentriq - cycle 6  Current Therapy:   Tecentriq 1200 mg IV q 3 week -- maintenance -  Started 01/21/2019   Interim History:  Mr. Leyda is here today for follow-up and treatment. He is doing fairly well. He still has some mild swelling in both lower extremities. He is working with Dr. Jess Barters office on a leg brace. He is not happy with ow the current one turned out and plans to follow-up with them to have it changed to his specifications.  Pedal pulses are 2+.  He has neuropathy in his feet he states from previous back injuries and surgery. This is unchanged.  He uses a cane when ambulating for balance support. No falls or syncopal episodes to report.  He followed up with Dr. Sondra Come regarding need for radiation and thankfully his most recent PET scan showed improvement and radiation was not felt to have any added benefit at this time. He did recommend follow-up MRI of the brain since it has been over 6 months since his last one.  He will be seeing a retina specialist Next week on Monday for floaters in the left eye. He has had no fever, chills, n/v, rash, dizziness, chest pain, palpitations, abdominal pain or changes in bladder habits.  He would like to try lactulose for intermittent constipation.  He has a cough and SOB secondary to emphysema. He is still smoking 1/2 ppd.  He has maintained a good appetite and is staying well hydrated. His weight is stable.    ECOG Performance Status: 1 - Symptomatic but completely ambulatory  Medications:  Allergies as of 02/11/2019      Reactions   Bee Venom Anaphylaxis   Clindamycin/lincomycin Hives      Medication List       Accurate as of February 11, 2019  8:27 AM. If you have any questions, ask your  nurse or doctor.        allopurinol 100 MG tablet Commonly known as: ZYLOPRIM Take 2 tablets (200 mg total) by mouth every morning. What changed:   how much to take  when to take this  additional instructions   amLODipine 10 MG tablet Commonly known as: NORVASC TAKE 1 TABLET BY MOUTH EVERY MORNING   aspirin EC 81 MG tablet Take 81 mg by mouth daily.   calcium carbonate 500 MG chewable tablet Commonly known as: TUMS - dosed in mg elemental calcium Chew 2 tablets by mouth daily as needed for indigestion or heartburn.   diphenhydrAMINE 25 MG tablet Commonly known as: SOMINEX Take 25 mg by mouth at bedtime as needed for sleep.   EPINEPHrine 0.3 mg/0.3 mL Soaj injection Commonly known as: EPI-PEN Inject 0.3 mg into the muscle once.   fenofibrate 160 MG tablet Take 1 tablet (160 mg total) by mouth daily.   HYDROcodone-acetaminophen 5-325 MG tablet Commonly known as: NORCO/VICODIN Take 1 tablet by mouth every 6 (six) hours as needed for moderate pain.   HYDROcodone-homatropine 5-1.5 MG/5ML syrup Commonly known as: HYCODAN Take 5 mLs by mouth every 4 (four) hours as needed for cough.   Ipratropium-Albuterol 20-100 MCG/ACT Aers respimat Commonly known as: COMBIVENT Inhale 1 puff into the lungs every 6 (six) hours.   lidocaine-prilocaine cream Commonly known as: EMLA   lisinopril 20 MG tablet Commonly known  as: ZESTRIL TAKE 1 TABLET BY MOUTH ONCE DAILY   LORazepam 0.5 MG tablet Commonly known as: ATIVAN Take 0.5 mg by mouth at bedtime.   magic mouthwash w/lidocaine Soln Take 5 mLs by mouth 3 (three) times daily as needed for mouth pain. benadryl 525 mg, hydrocortisone 60 mg and nystatin 0.6 mg.   multivitamin tablet Take 1 tablet by mouth daily.   rosuvastatin 10 MG tablet Commonly known as: CRESTOR TAKE 1 TABLET BY MOUTH ONCE DAILY   solifenacin 10 MG tablet Commonly known as: VESICARE Take 10 mg by mouth every evening.   UNABLE TO FIND Med Name:  Allergy shots.   vitamin B-6 500 MG tablet Take 1 tablet (500 mg total) by mouth daily.       Allergies:  Allergies  Allergen Reactions  . Bee Venom Anaphylaxis  . Clindamycin/Lincomycin Hives    Past Medical History, Surgical history, Social history, and Family History were reviewed and updated.  Review of Systems: All other 10 point review of systems is negative.   Physical Exam:  vitals were not taken for this visit.   Wt Readings from Last 3 Encounters:  02/11/19 206 lb 8 oz (93.7 kg)  02/05/19 208 lb 6.4 oz (94.5 kg)  01/20/19 208 lb (94.3 kg)    Ocular: Sclerae unicteric, pupils equal, round and reactive to light Ear-nose-throat: Oropharynx clear, dentition fair Lymphatic: No cervical or supraclavicular adenopathy Lungs no rales or rhonchi, good excursion bilaterally Heart regular rate and rhythm, no murmur appreciated Abd soft, nontender, positive bowel sounds, no liver or spleen tip palpated on exam, no fluid wave  MSK no focal spinal tenderness, no joint edema Neuro: non-focal, well-oriented, appropriate affect Breasts: Deferred   Lab Results  Component Value Date   WBC 5.4 02/11/2019   HGB 11.8 (L) 02/11/2019   HCT 35.5 (L) 02/11/2019   MCV 96.7 02/11/2019   PLT 192 02/11/2019   Lab Results  Component Value Date   FERRITIN 110 10/01/2018   IRON 107 10/01/2018   TIBC 388 10/01/2018   UIBC 281 10/01/2018   IRONPCTSAT 27 10/01/2018   Lab Results  Component Value Date   RETICCTPCT 1.2 05/31/2015   RBC 3.67 (L) 02/11/2019   RETICCTABS 52.9 05/31/2015   No results found for: KPAFRELGTCHN, LAMBDASER, KAPLAMBRATIO Lab Results  Component Value Date   IGGSERUM 657 (L) 07/04/2018   No results found for: Kathrynn Ducking, MSPIKE, SPEI   Chemistry      Component Value Date/Time   NA 133 (L) 01/21/2019 0750   NA 135 (L) 07/12/2015 0813   K 4.5 01/21/2019 0750   K 4.5 07/12/2015 0813   CL 99 01/21/2019 0750    CO2 25 01/21/2019 0750   CO2 24 07/12/2015 0813   BUN 22 01/21/2019 0750   BUN 15.7 07/12/2015 0813   CREATININE 1.25 (H) 01/21/2019 0750   CREATININE 1.6 (H) 07/12/2015 0813      Component Value Date/Time   CALCIUM 10.2 01/21/2019 0750   CALCIUM 10.1 07/12/2015 0813   ALKPHOS 81 01/21/2019 0750   ALKPHOS 115 07/12/2015 0813   AST 16 01/21/2019 0750   AST 22 07/12/2015 0813   ALT 20 01/21/2019 0750   ALT 24 07/12/2015 0813   BILITOT 0.3 01/21/2019 0750   BILITOT 0.46 07/12/2015 0813       Impression and Plan: Mr. Dubin is a very pleasant 68 yo caucasian gentleman with history of renal cell carcinoma and prostate cancer. He now  has metastatic small cell lung cancer.  We will proceed with treatment today as planned.  Script for lactulose sent as well as refills for ativan and hycodan.  MRI of the brain ordered as well.  We will have him start taking a calcium supplement daily.  We will see him back in another 3 weeks.  He will contact our office with any questions or concerns. We can certainly see him sooner if needed.   Laverna Peace, NP 7/15/20208:27 AM

## 2019-02-16 ENCOUNTER — Other Ambulatory Visit: Payer: Self-pay

## 2019-02-16 ENCOUNTER — Encounter (INDEPENDENT_AMBULATORY_CARE_PROVIDER_SITE_OTHER): Payer: 59 | Admitting: Ophthalmology

## 2019-02-16 DIAGNOSIS — I10 Essential (primary) hypertension: Secondary | ICD-10-CM

## 2019-02-16 DIAGNOSIS — H4312 Vitreous hemorrhage, left eye: Secondary | ICD-10-CM

## 2019-02-16 DIAGNOSIS — H43813 Vitreous degeneration, bilateral: Secondary | ICD-10-CM | POA: Diagnosis not present

## 2019-02-16 DIAGNOSIS — H35033 Hypertensive retinopathy, bilateral: Secondary | ICD-10-CM | POA: Diagnosis not present

## 2019-02-25 ENCOUNTER — Ambulatory Visit (HOSPITAL_COMMUNITY)
Admission: RE | Admit: 2019-02-25 | Discharge: 2019-02-25 | Disposition: A | Discharge status: Home / Self Care | Payer: 59 | Source: Ambulatory Visit | Attending: Family | Admitting: Family

## 2019-02-25 ENCOUNTER — Encounter: Payer: Self-pay | Admitting: Hematology & Oncology

## 2019-02-25 ENCOUNTER — Other Ambulatory Visit: Payer: Self-pay

## 2019-02-25 DIAGNOSIS — C3491 Malignant neoplasm of unspecified part of right bronchus or lung: Secondary | ICD-10-CM | POA: Insufficient documentation

## 2019-02-25 DIAGNOSIS — T63461D Toxic effect of venom of wasps, accidental (unintentional), subsequent encounter: Secondary | ICD-10-CM | POA: Diagnosis not present

## 2019-02-25 DIAGNOSIS — C3490 Malignant neoplasm of unspecified part of unspecified bronchus or lung: Secondary | ICD-10-CM | POA: Diagnosis not present

## 2019-02-25 DIAGNOSIS — T63451D Toxic effect of venom of hornets, accidental (unintentional), subsequent encounter: Secondary | ICD-10-CM | POA: Diagnosis not present

## 2019-02-25 DIAGNOSIS — T63441D Toxic effect of venom of bees, accidental (unintentional), subsequent encounter: Secondary | ICD-10-CM | POA: Diagnosis not present

## 2019-02-25 MED ORDER — GADOBUTROL 1 MMOL/ML IV SOLN
9.0000 mL | Freq: Once | INTRAVENOUS | Status: AC | PRN
  Administered 2019-02-25: 9 mL via INTRAVENOUS

## 2019-03-01 NOTE — Progress Notes (Signed)
Radiation Oncology         [phone]) [phone] ________________________________  Name: Taylor Elliott MRN: [id number]  Date: 03/02/2019  DOB: [birth year]  Follow-Up New Note  CC: Ann Held, DO  Ennever, Rudell Cobb, MD    ICD-10-CM   1. Small cell lung cancer, right (HCC)  C34.91     Diagnosis:   Extensive stage small cell lung cancer  Narrative:  The patient returns today for re-evaluation. He was last seen on 02/05/2019.   He underwent brain MRI on 02/25/2019, which revealed: interval development of approximately 5 areas of restricted diffusion in the brain, at least 2 of these show slight enhancement and are highly suspicious for metastatic disease rather than infarction, other lesions also likely due to metastatic disease, all lesions under 4 mm; interval development of scattered white matter hyperintensities compared with the prior MRI which may represent interval infarction or nonenhancing tumor.   He is currently asymptomatic-- he denies seizures, headaches, nausea, dizziness, difficulty with hand coordination, focal numbness or weakness, visual changes, and confusion or memory deficits.  ALLERGIES:  is allergic to bee venom and clindamycin/lincomycin.  Meds: Current Outpatient Medications  Medication Sig Dispense Refill   allopurinol (ZYLOPRIM) 100 MG tablet Take 2 tablets (200 mg total) by mouth every morning. (Patient taking differently: Take 100 mg by mouth daily. Pt taking 1 tablet daily) 180 tablet 3   amLODipine (NORVASC) 10 MG tablet TAKE 1 TABLET BY MOUTH EVERY MORNING 90 tablet 1   aspirin EC 81 MG tablet Take 81 mg by mouth daily.     calcium carbonate (TUMS - DOSED IN MG ELEMENTAL CALCIUM) 500 MG chewable tablet Chew 2 tablets by mouth daily as needed for indigestion or heartburn.      EPINEPHrine 0.3 mg/0.3 mL IJ SOAJ injection Inject 0.3 mg into the muscle once.     fenofibrate 160 MG tablet Take 1 tablet (160 mg total) by mouth daily. 90 tablet 1    HYDROcodone-acetaminophen (NORCO/VICODIN) 5-325 MG tablet Take 1 tablet by mouth every 6 (six) hours as needed for moderate pain. 60 tablet 0   HYDROcodone-homatropine (HYCODAN) 5-1.5 MG/5ML syrup Take 5 mLs by mouth every 4 (four) hours as needed for cough. 250 mL 0   Ipratropium-Albuterol (COMBIVENT) 20-100 MCG/ACT AERS respimat Inhale 1 puff into the lungs every 6 (six) hours. 2 Inhaler 4   lactulose (CHRONULAC) 10 GM/15ML solution Take 15 mLs (10 g total) by mouth 2 (two) times daily as needed for mild constipation. 473 mL 1   lidocaine-prilocaine (EMLA) cream      lisinopril (PRINIVIL,ZESTRIL) 20 MG tablet TAKE 1 TABLET BY MOUTH ONCE DAILY 90 tablet 1   LORazepam (ATIVAN) 0.5 MG tablet Take 1 tablet (0.5 mg total) by mouth at bedtime. 30 tablet 0   magic mouthwash w/lidocaine SOLN Take 5 mLs by mouth 3 (three) times daily as needed for mouth pain. benadryl 525 mg, hydrocortisone 60 mg and nystatin 0.6 mg. 240 mL 6   Multiple Vitamin (MULTIVITAMIN) tablet Take 1 tablet by mouth daily.     Pyridoxine HCl (VITAMIN B-6) 500 MG tablet Take 1 tablet (500 mg total) by mouth daily. 60 tablet 4   solifenacin (VESICARE) 10 MG tablet Take 10 mg by mouth every evening.     diphenhydrAMINE (SOMINEX) 25 MG tablet Take 25 mg by mouth at bedtime as needed for sleep.     rosuvastatin (CRESTOR) 10 MG tablet TAKE 1 TABLET BY MOUTH ONCE DAILY (Patient not taking: Reported  on 02/05/2019) 90 tablet 1   UNABLE TO FIND Med Name: Allergy shots.     No current facility-administered medications for this encounter.     Physical Findings: The patient is in no acute distress. Patient is alert and oriented.  height is 5\' 8"  (1.727 m) and weight is 208 lb 6 oz (94.5 kg). His temporal temperature is 98.7 F (37.1 C). His blood pressure is 135/72 and his pulse is 73. His respiration is 18 and oxygen saturation is 100%. .  Lungs are clear to auscultation bilaterally. Heart has regular rate and rhythm. No palpable  cervical, supraclavicular, or axillary adenopathy. Abdomen soft, non-tender, normal bowel sounds.  Neurological examination there are no focal deficits.  Motor strength is 5 out of 5 in the proximal and distal muscle groups of the upper and lower extremities other than his previously documented left extremity for weakness for which he wears a brace.  Lab Findings: Lab Results  Component Value Date   WBC 5.4 02/11/2019   HGB 11.8 (L) 02/11/2019   HCT 35.5 (L) 02/11/2019   MCV 96.7 02/11/2019   PLT 192 02/11/2019    Radiographic Findings: Mr Jeri Cos GL Contrast  Result Date: 02/25/2019 CLINICAL DATA:  Small cell lung cancer. Evaluate for metastatic disease. EXAM: MRI HEAD WITHOUT AND WITH CONTRAST TECHNIQUE: Multiplanar, multiecho pulse sequences of the brain and surrounding structures were obtained without and with intravenous contrast. CONTRAST:  9 mL Gadovist IV COMPARISON:  MRI head 07/10/2018 FINDINGS: Brain: Interval development of several small areas of restricted diffusion in both cerebral hemispheres involving the frontal lobes bilaterally, left parietal white matter, and left occipital lobe. Post contrast infusion, there is slight enhancement of the left frontal lesion measuring 1.5 mm. 2 x 3 mm enhancing lesion left parietal white matter. Other lesions do not show definite enhancement but remains suspicious for metastatic disease. No edema or mass-effect. Negative for hemorrhage. Ventricle size normal.  Negative for hemorrhage. Scattered small white matter hyperintensities bilaterally have progressed since the prior study and may be due to white matter a ischemic change which has occurred in the interval. Nonenhancing are treated metastatic deposits also possible. Vascular: Normal arterial flow voids Skull and upper cervical spine: No focal bone lesion. Sinuses/Orbits: Mild mucosal edema paranasal sinuses. Bilateral cataract surgery Other: None IMPRESSION: Interval development of  approximately 5 areas of restricted diffusion in the brain. At least 2 of these show slight enhancement and are highly suspicious for metastatic disease rather than infarction. Other lesions also likely due to metastatic disease. All lesions under 4 mm. Interval development of scattered white matter hyperintensities compared with the prior MRI which may represent interval infarction or nonenhancing tumor. These results were called by telephone at the time of interpretation on 02/25/2019 at 11:31 am to [facility] , who verbally acknowledged these results. Electronically Signed   By: Franchot Gallo M.D.   On: 02/25/2019 11:34   Nm Pet Image Restag (ps) Skull Base To Thigh  Result Date: 02/02/2019 CLINICAL DATA:  Subsequent treatment strategy for extensive stage small cell right lung cancer. Chemotherapy and immunotherapy. EXAM: NUCLEAR MEDICINE PET SKULL BASE TO THIGH TECHNIQUE: 10.3 mCi F-18 FDG was injected intravenously. Full-ring PET imaging was performed from the skull base to thigh after the radiotracer. CT data was obtained and used for attenuation correction and anatomic localization. Fasting blood glucose: 114 mg/dl COMPARISON:  12/02/2018 PET-CT. FINDINGS: Mediastinal blood pool activity: SUV max 3.1 Liver activity: SUV max NA NECK: No hypermetabolic lymph nodes  in the neck. Incidental CT findings: Mucoperiosteal thickening in right maxillary sinus is unchanged. Right internal jugular Port-A-Cath terminates at the cavoatrial junction. CHEST: No enlarged or hypermetabolic axillary, mediastinal or hilar lymph nodes. No residual or recurrent right parahilar hypermetabolism. No hypermetabolic pulmonary findings. Incidental CT findings: Atherosclerotic nonaneurysmal thoracic aorta. Moderate centrilobular emphysema. No acute consolidative airspace disease, lung masses or new significant pulmonary nodules. A few tiny right upper lobe pulmonary nodules, largest 3 mm (series 8/image 22), all stable and below  PET resolution. ABDOMEN/PELVIS: Stable mottled liver metabolism without hypermetabolic liver foci. Stable nodular liver contour without discrete liver lesions on the noncontrast CT images. No abnormal hypermetabolic activity within the pancreas, adrenal glands, or spleen. No hypermetabolic lymph nodes in the abdomen or pelvis. Incidental CT findings: Right nephrectomy. Atherosclerotic nonaneurysmal abdominal aorta. Prostatectomy. Stable moderate fat containing periumbilical hernia. SKELETON: No focal skeletal hypermetabolism associated with a innumerable small sclerotic lesions throughout the skeleton. Incidental CT findings: Patchy small sclerotic lesions throughout skeleton are not appreciably changed on the CT images. No appreciable new focal osseous lesions. IMPRESSION: 1. No residual or recurrent hypermetabolic right hilar tumor. 2. No hypermetabolic metastatic disease. Stable mottled uptake in the nodular liver without focal liver hypermetabolism, favor post treatment change. Stable small sclerotic lesions throughout the skeleton, which are not metabolically active. 3. Aortic Atherosclerosis (ICD10-I70.0) and Emphysema (ICD10-J43.9). Electronically Signed   By: Ilona Sorrel M.D.   On: 02/02/2019 09:56    Impression:  Extensive stage small cell lung cancer.  Recent brain MRI scan is concerning for asymptomatic brain metastasis.  These lesions are quite small and we could consider repeating another MRI in 1-2 months.  Patient and his wife however feel most comfortable with proceeding with radiation therapy at this time.  We will review the patient's MRI further, possibly with radiology, possibly at the next brain tumor conference.   Plan: Patient will be scheduled for CT simulation in the near future pending above reviews,  anticipate 14 radiation treatments directed at the whole brain.  ____________________________________ Gery Pray, MD   This document serves as a record of services personally  performed by Gery Pray, MD. It was created on his behalf by Wilburn Mylar, a trained medical scribe. The creation of this record is based on the scribe's personal observations and the provider's statements to them. This document has been checked and approved by the attending provider.

## 2019-03-02 ENCOUNTER — Encounter: Payer: Self-pay | Admitting: Radiation Oncology

## 2019-03-02 ENCOUNTER — Ambulatory Visit
Admission: RE | Admit: 2019-03-02 | Discharge: 2019-03-02 | Disposition: A | Discharge status: Home / Self Care | Payer: 59 | Source: Ambulatory Visit | Attending: Radiation Oncology | Admitting: Radiation Oncology

## 2019-03-02 ENCOUNTER — Other Ambulatory Visit: Payer: Self-pay

## 2019-03-02 DIAGNOSIS — Z87891 Personal history of nicotine dependence: Secondary | ICD-10-CM | POA: Diagnosis not present

## 2019-03-02 DIAGNOSIS — I700 Atherosclerosis of aorta: Secondary | ICD-10-CM | POA: Diagnosis not present

## 2019-03-02 DIAGNOSIS — R9089 Other abnormal findings on diagnostic imaging of central nervous system: Secondary | ICD-10-CM | POA: Diagnosis not present

## 2019-03-02 DIAGNOSIS — J439 Emphysema, unspecified: Secondary | ICD-10-CM | POA: Diagnosis not present

## 2019-03-02 DIAGNOSIS — Z79899 Other long term (current) drug therapy: Secondary | ICD-10-CM | POA: Diagnosis not present

## 2019-03-02 DIAGNOSIS — C7931 Secondary malignant neoplasm of brain: Secondary | ICD-10-CM | POA: Diagnosis not present

## 2019-03-02 DIAGNOSIS — C3401 Malignant neoplasm of right main bronchus: Secondary | ICD-10-CM | POA: Diagnosis not present

## 2019-03-02 DIAGNOSIS — K429 Umbilical hernia without obstruction or gangrene: Secondary | ICD-10-CM | POA: Diagnosis not present

## 2019-03-02 DIAGNOSIS — C787 Secondary malignant neoplasm of liver and intrahepatic bile duct: Secondary | ICD-10-CM | POA: Diagnosis not present

## 2019-03-02 DIAGNOSIS — Z7982 Long term (current) use of aspirin: Secondary | ICD-10-CM | POA: Diagnosis not present

## 2019-03-02 DIAGNOSIS — C3491 Malignant neoplasm of unspecified part of right bronchus or lung: Secondary | ICD-10-CM | POA: Diagnosis not present

## 2019-03-02 DIAGNOSIS — C778 Secondary and unspecified malignant neoplasm of lymph nodes of multiple regions: Secondary | ICD-10-CM | POA: Insufficient documentation

## 2019-03-02 NOTE — Patient Instructions (Signed)
Coronavirus (COVID-19) Are you at risk?  Are you at risk for the Coronavirus (COVID-19)?  To be considered HIGH RISK for Coronavirus (COVID-19), you have to meet the following criteria:  . Traveled to [place], [place], [place], [place] or [place]; or in the [place] to [place], [place], [place], or [place]; and have fever, cough, and shortness of breath within the last 2 weeks of travel OR . Been in close contact with a person diagnosed with COVID-19 within the last 2 weeks and have fever, cough, and shortness of breath . IF YOU DO NOT MEET THESE CRITERIA, YOU ARE CONSIDERED LOW RISK FOR COVID-19.  What to do if you are HIGH RISK for COVID-19?  . If you are having a medical emergency, call 911. . Seek medical care right away. Before you go to a doctor's office, urgent care or emergency department, call ahead and tell them about your recent travel, contact with someone diagnosed with COVID-19, and your symptoms. You should receive instructions from your physician's office regarding next steps of care.  . When you arrive at healthcare provider, tell the healthcare staff immediately you have returned from visiting [place], [place], [place], [place] or [place]; or traveled in the [place] to [place], [place], [place], or [place]; in the last two weeks or you have been in close contact with a person diagnosed with COVID-19 in the last 2 weeks.   . Tell the health care staff about your symptoms: fever, cough and shortness of breath. . After you have been seen by a medical provider, you will be either: o Tested for (COVID-19) and discharged home on quarantine except to seek medical care if symptoms worsen, and asked to  - Stay home and avoid contact with others until you get your results (4-5 days)  - Avoid travel on public transportation if possible (such as bus, train, or airplane) or o Sent to the Emergency Department by EMS for evaluation, COVID-19 testing, and possible  admission depending on your condition and test results.  What to do if you are LOW RISK for COVID-19?  Reduce your risk of any infection by using the same precautions used for avoiding the common cold or flu:  . Wash your hands often with soap and warm water for at least 20 seconds.  If soap and water are not readily available, use an alcohol-based hand sanitizer with at least 60% alcohol.  . If coughing or sneezing, cover your mouth and nose by coughing or sneezing into the elbow areas of your shirt or coat, into a tissue or into your sleeve (not your hands). . Avoid shaking hands with others and consider head nods or verbal greetings only. . Avoid touching your eyes, nose, or mouth with unwashed hands.  . Avoid close contact with people who are sick. . Avoid places or events with large numbers of people in one location, like concerts or sporting events. . Carefully consider travel plans you have or are making. . If you are planning any travel outside or inside the [place], visit the CDC's Travelers' Health webpage for the latest health notices. . If you have some symptoms but not all symptoms, continue to monitor at home and seek medical attention if your symptoms worsen. . If you are having a medical emergency, call 911.   ADDITIONAL HEALTHCARE OPTIONS FOR PATIENTS  [place] [facility] / e-Visit: [email or web address]         [facility]: [phone]  [place] [place]   [facility]: [phone]                   [facility]: [phone]   

## 2019-03-02 NOTE — Progress Notes (Signed)
Location/Histology of Brain Tumor: mets to brain  Patient presented with symptoms of:  Follow up MRI low sodium  Past or anticipated interventions, if any, per neurosurgery: none  Past or anticipated interventions, if any, per medical oncology: continues to have maintance chemotherapy  Dose of Decadron, if applicable:noone  Recent neurologic symptoms, if any:   Seizures: none  Headaches: none  Nausea: none  Dizziness/ataxia: none  Difficulty with hand coordination: none  Focal numbness/weakness: none  Visual deficits/changes: none  Confusion/Memory deficits: none  Painful bone metastases at present, if any: none  SAFETY ISSUES:  Prior radiation? no  Pacemaker/ICD? no  Possible current pregnancy? no Is the patient on methotrexate? no Additional Complaints / other details:Recommended MRI per Dr Sondra Come found to have mets to the brain from [facility]. Denies any symtoms.

## 2019-03-03 ENCOUNTER — Other Ambulatory Visit: Payer: Self-pay | Admitting: Radiation Therapy

## 2019-03-04 ENCOUNTER — Inpatient Hospital Stay: Payer: 59

## 2019-03-04 ENCOUNTER — Encounter: Payer: Self-pay | Admitting: Family

## 2019-03-04 ENCOUNTER — Other Ambulatory Visit: Payer: Self-pay

## 2019-03-04 ENCOUNTER — Encounter: Payer: Self-pay | Admitting: Hematology & Oncology

## 2019-03-04 ENCOUNTER — Inpatient Hospital Stay (HOSPITAL_BASED_OUTPATIENT_CLINIC_OR_DEPARTMENT_OTHER): Payer: 59 | Admitting: Hematology & Oncology

## 2019-03-04 VITALS — BP 126/60 | HR 85 | Temp 97.4°F | Resp 17 | Wt 206.1 lb

## 2019-03-04 DIAGNOSIS — Z7982 Long term (current) use of aspirin: Secondary | ICD-10-CM | POA: Insufficient documentation

## 2019-03-04 DIAGNOSIS — Z79899 Other long term (current) drug therapy: Secondary | ICD-10-CM | POA: Insufficient documentation

## 2019-03-04 DIAGNOSIS — K5900 Constipation, unspecified: Secondary | ICD-10-CM | POA: Insufficient documentation

## 2019-03-04 DIAGNOSIS — C7931 Secondary malignant neoplasm of brain: Secondary | ICD-10-CM | POA: Insufficient documentation

## 2019-03-04 DIAGNOSIS — C3491 Malignant neoplasm of unspecified part of right bronchus or lung: Secondary | ICD-10-CM

## 2019-03-04 DIAGNOSIS — Z5112 Encounter for antineoplastic immunotherapy: Secondary | ICD-10-CM | POA: Insufficient documentation

## 2019-03-04 DIAGNOSIS — R05 Cough: Secondary | ICD-10-CM

## 2019-03-04 DIAGNOSIS — Z85528 Personal history of other malignant neoplasm of kidney: Secondary | ICD-10-CM | POA: Insufficient documentation

## 2019-03-04 DIAGNOSIS — R059 Cough, unspecified: Secondary | ICD-10-CM

## 2019-03-04 DIAGNOSIS — Z8546 Personal history of malignant neoplasm of prostate: Secondary | ICD-10-CM | POA: Insufficient documentation

## 2019-03-04 DIAGNOSIS — Z510 Encounter for antineoplastic radiation therapy: Secondary | ICD-10-CM | POA: Diagnosis not present

## 2019-03-04 LAB — CMP (CANCER CENTER ONLY)
ALT: 20 U/L (ref 0–44)
AST: 18 U/L (ref 15–41)
Albumin: 3.8 g/dL (ref 3.5–5.0)
Alkaline Phosphatase: 69 U/L (ref 38–126)
Anion gap: 7 (ref 5–15)
BUN: 18 mg/dL (ref 8–23)
CO2: 26 mmol/L (ref 22–32)
Calcium: 9.5 mg/dL (ref 8.9–10.3)
Chloride: 100 mmol/L (ref 98–111)
Creatinine: 1.21 mg/dL (ref 0.61–1.24)
GFR, Est AFR Am: >60 mL/min (ref >60)
GFR, Estimated: >60 mL/min (ref >60)
Glucose, Bld: 114 mg/dL — ABNORMAL HIGH (ref 70–99)
Potassium: 4.6 mmol/L (ref 3.5–5.1)
Sodium: 133 mmol/L — ABNORMAL LOW (ref 135–145)
Total Bilirubin: 0.3 mg/dL (ref 0.3–1.2)
Total Protein: 6.2 g/dL — ABNORMAL LOW (ref 6.5–8.1)

## 2019-03-04 LAB — CBC WITH DIFFERENTIAL (CANCER CENTER ONLY)
Abs Immature Granulocytes: 0.02 10*3/uL (ref 0.00–0.07)
Basophils Absolute: 0 10*3/uL (ref 0.0–0.1)
Basophils Relative: 1 %
Eosinophils Absolute: 0.1 10*3/uL (ref 0.0–0.5)
Eosinophils Relative: 3 %
HCT: 39.8 % (ref 39.0–52.0)
Hemoglobin: 13.3 g/dL (ref 13.0–17.0)
Immature Granulocytes: 0 %
Lymphocytes Relative: 13 %
Lymphs Abs: 0.7 10*3/uL (ref 0.7–4.0)
MCH: 31.3 pg (ref 26.0–34.0)
MCHC: 33.4 g/dL (ref 30.0–36.0)
MCV: 93.6 fL (ref 80.0–100.0)
Monocytes Absolute: 0.5 10*3/uL (ref 0.1–1.0)
Monocytes Relative: 11 %
Neutro Abs: 3.6 10*3/uL (ref 1.7–7.7)
Neutrophils Relative %: 72 %
Platelet Count: 210 10*3/uL (ref 150–400)
RBC: 4.25 MIL/uL (ref 4.22–5.81)
RDW: 13.2 % (ref 11.5–15.5)
WBC Count: 5 10*3/uL (ref 4.0–10.5)
nRBC: 0 % (ref 0.0–0.2)

## 2019-03-04 LAB — LACTATE DEHYDROGENASE: LDH: 161 U/L (ref 98–192)

## 2019-03-04 MED ORDER — HYDROCODONE-HOMATROPINE 5-1.5 MG/5ML PO SYRP: 5.0000 mL | ORAL_SOLUTION | ORAL | 0 refills | Status: DC | PRN

## 2019-03-04 MED ORDER — SODIUM CHLORIDE 0.9% FLUSH
10.0000 mL | INTRAVENOUS | Status: DC | PRN
  Administered 2019-03-04: 10 mL
  Filled 2019-03-04: qty 10

## 2019-03-04 MED ORDER — SODIUM CHLORIDE 0.9 % IV SOLN
Freq: Once | INTRAVENOUS | Status: AC
  Administered 2019-03-04: 11:00:00 via INTRAVENOUS
  Filled 2019-03-04: qty 250

## 2019-03-04 MED ORDER — SODIUM CHLORIDE 0.9 % IV SOLN
1200.0000 mg | Freq: Once | INTRAVENOUS | Status: AC
  Administered 2019-03-04: 1200 mg via INTRAVENOUS
  Filled 2019-03-04: qty 20

## 2019-03-04 MED ORDER — TEMAZEPAM 30 MG PO CAPS: 30.0000 mg | ORAL_CAPSULE | Freq: Every evening | ORAL | 0 refills | Status: DC | PRN

## 2019-03-04 MED ORDER — HEPARIN SOD (PORK) LOCK FLUSH 100 UNIT/ML IV SOLN
500.0000 [IU] | Freq: Once | INTRAVENOUS | Status: AC | PRN
  Administered 2019-03-04: 500 [IU]
  Filled 2019-03-04: qty 5

## 2019-03-04 MED FILL — HYDROCODONE-HOMATROPINE SOL: 5-1.5 | 10 days supply | Qty: 250 | Fill #0

## 2019-03-04 MED FILL — TEMAZEPAM 30 MG CAPSULE: 30 | 30 days supply | Qty: 30 | Fill #0

## 2019-03-04 NOTE — Patient Instructions (Signed)
Atezolizumab injection What is this medicine? ATEZOLIZUMAB (a te zoe LIZ ue mab) is a monoclonal antibody. It is used to treat bladder cancer (urothelial cancer), non-small cell lung cancer, small cell lung cancer, and breast cancer. This medicine may be used for other purposes; ask your health care provider or pharmacist if you have questions. COMMON BRAND NAME(S): Tecentriq What should I tell my health care provider before I take this medicine? They need to know if you have any of these conditions:  diabetes  immune system problems  infection  inflammatory bowel disease  liver disease  lung or breathing disease  lupus  nervous system problems like myasthenia gravis or Guillain-Barre syndrome  organ transplant  an unusual or allergic reaction to atezolizumab, other medicines, foods, dyes, or preservatives  pregnant or trying to get pregnant  breast-feeding How should I use this medicine? This medicine is for infusion into a vein. It is given by a health care professional in a hospital or clinic setting. A special MedGuide will be given to you before each treatment. Be sure to read this information carefully each time. Talk to your pediatrician regarding the use of this medicine in children. Special care may be needed. Overdosage: If you think you have taken too much of this medicine contact a poison control center or emergency room at once. NOTE: This medicine is only for you. Do not share this medicine with others. What if I miss a dose? It is important not to miss your dose. Call your doctor or health care professional if you are unable to keep an appointment. What may interact with this medicine? Interactions have not been studied. This list may not describe all possible interactions. Give your health care provider a list of all the medicines, herbs, non-prescription drugs, or dietary supplements you use. Also tell them if you smoke, drink alcohol, or use illegal drugs.  Some items may interact with your medicine. What should I watch for while using this medicine? Your condition will be monitored carefully while you are receiving this medicine. You may need blood work done while you are taking this medicine. Do not become pregnant while taking this medicine or for at least 5 months after stopping it. Women should inform their doctor if they wish to become pregnant or think they might be pregnant. There is a potential for serious side effects to an unborn child. Talk to your health care professional or pharmacist for more information. Do not breast-feed an infant while taking this medicine or for at least 5 months after the last dose. What side effects may I notice from receiving this medicine? Side effects that you should report to your doctor or health care professional as soon as possible:  allergic reactions like skin rash, itching or hives, swelling of the face, lips, or tongue  black, tarry stools  bloody or watery diarrhea  breathing problems  changes in vision  chest pain or chest tightness  chills  facial flushing  fever  headache  signs and symptoms of high blood sugar such as dizziness; dry mouth; dry skin; fruity breath; nausea; stomach pain; increased hunger or thirst; increased urination  signs and symptoms of liver injury like dark yellow or brown urine; general ill feeling or flu-like symptoms; light-colored stools; loss of appetite; nausea; right upper belly pain; unusually weak or tired; yellowing of the eyes or skin  stomach pain  trouble passing urine or change in the amount of urine Side effects that usually do not require medical   attention (report to your doctor or health care professional if they continue or are bothersome):  cough  diarrhea  joint pain  muscle pain  muscle weakness  tiredness  weight loss This list may not describe all possible side effects. Call your doctor for medical advice about side  effects. You may report side effects to FDA at 1-800-FDA-1088. Where should I keep my medicine? This drug is given in a hospital or clinic and will not be stored at home. NOTE: This sheet is a summary. It may not cover all possible information. If you have questions about this medicine, talk to your doctor, pharmacist, or health care provider.  2020 Elsevier/Gold Standard (2017-10-18 09:33:38)  

## 2019-03-04 NOTE — Progress Notes (Signed)
Hematology and Oncology Follow Up Visit  Taylor Elliott [id number] June 16, 1951 68 y.o. 03/04/2019   Principle Diagnosis:   Extensive stage small cell lung cancer  SIADH secondary to small cell lung cancer  Current Therapy:    Carboplatinum/etoposide/Tecentriq-cycle #6  Atezolizumab 1200 mg IV q 3 week -- maintenance -  Start 01/21/2019       Interim History:  Taylor Elliott is back for follow-up.  Unfortunately, looks like we now have an issue with respect to brain metastasis.  Taylor Elliott had a routine MRI of the brain.  This was done on 02/25/2019.  The radiologist, who I totally trust, saw 5 areas of abnormality.  These were quite suspicious for metastatic disease.  All lesions are under 4 mm.  Taylor Elliott is seen Dr. Sondra Come of Radiation Oncology.  Dr. Sofie Hartigan is going to review the MRI before Taylor Elliott decides about the radiation.  Otherwise, Taylor Elliott is doing quite well.  Taylor Elliott has had no headache.  Taylor Elliott has had no cough.  Taylor Elliott has had no nausea or vomiting.  There is been no change in bowel or bladder habits.  Is a brace on Taylor Elliott left leg.  Taylor Elliott has had a little swelling in Taylor Elliott legs.  I suspect this probably is from the amlodipine that Taylor Elliott is taking.  Taylor Elliott will talk to Taylor Elliott family doctor about possibly changing the amlodipine.  Taylor Elliott is not sleeping all that well.  The Ativan is really not helpful.  I will try him on some Restoril.  Taylor Elliott has had some constipation.  Taylor Elliott is on medicine to try to help with this.  Taylor Elliott goes up to the [place] that Taylor Elliott and Taylor Elliott have.  They enjoy this.  I forgot to mention that Taylor Elliott is trying to quit smoking.  Taylor Elliott is incredibly encouraging.  Taylor Elliott is cutting back by 2 cigarettes a day.  Taylor Elliott is now down to half pack a day.  I am incredibly proud of him for doing this.  Overall, Taylor Elliott performance status is ECOG 1.   Medications:  Current Outpatient Medications:  .  allopurinol (ZYLOPRIM) 100 MG tablet, Take 2 tablets (200 mg total) by mouth every morning. (Patient taking differently: Take 100 mg by  mouth daily. Pt taking 1 tablet daily), Disp: 180 tablet, Rfl: 3 .  amLODipine (NORVASC) 10 MG tablet, TAKE 1 TABLET BY MOUTH EVERY MORNING, Disp: 90 tablet, Rfl: 1 .  aspirin EC 81 MG tablet, Take 81 mg by mouth daily., Disp: , Rfl:  .  calcium carbonate (TUMS - DOSED IN MG ELEMENTAL CALCIUM) 500 MG chewable tablet, Chew 2 tablets by mouth daily as needed for indigestion or heartburn. , Disp: , Rfl:  .  fenofibrate 160 MG tablet, Take 1 tablet (160 mg total) by mouth daily., Disp: 90 tablet, Rfl: 1 .  HYDROcodone-homatropine (HYCODAN) 5-1.5 MG/5ML syrup, Take 5 mLs by mouth every 4 (four) hours as needed for cough., Disp: 250 mL, Rfl: 0 .  lactulose (CHRONULAC) 10 GM/15ML solution, Take 15 mLs (10 g total) by mouth 2 (two) times daily as needed for mild constipation., Disp: 473 mL, Rfl: 1 .  lidocaine-prilocaine (EMLA) cream, , Disp: , Rfl:  .  lisinopril (PRINIVIL,ZESTRIL) 20 MG tablet, TAKE 1 TABLET BY MOUTH ONCE DAILY, Disp: 90 tablet, Rfl: 1 .  LORazepam (ATIVAN) 0.5 MG tablet, Take 1 tablet (0.5 mg total) by mouth at bedtime., Disp: 30 tablet, Rfl: 0 .  Multiple Vitamin (MULTIVITAMIN) tablet, Take 1 tablet by mouth daily., Disp: ,  Rfl:  .  Pyridoxine HCl (VITAMIN B-6) 500 MG tablet, Take 1 tablet (500 mg total) by mouth daily., Disp: 60 tablet, Rfl: 4 .  solifenacin (VESICARE) 10 MG tablet, Take 10 mg by mouth every evening., Disp: , Rfl:  .  UNABLE TO FIND, Med Name: Allergy shots., Disp: , Rfl:  .  diphenhydrAMINE (SOMINEX) 25 MG tablet, Take 25 mg by mouth at bedtime as needed for sleep., Disp: , Rfl:  .  EPINEPHrine 0.3 mg/0.3 mL IJ SOAJ injection, Inject 0.3 mg into the muscle once., Disp: , Rfl:  .  HYDROcodone-acetaminophen (NORCO/VICODIN) 5-325 MG tablet, Take 1 tablet by mouth every 6 (six) hours as needed for moderate pain. (Patient not taking: Reported on 03/04/2019), Disp: 60 tablet, Rfl: 0 .  Ipratropium-Albuterol (COMBIVENT) 20-100 MCG/ACT AERS respimat, Inhale 1 puff into the  lungs every 6 (six) hours. (Patient not taking: Reported on 03/04/2019), Disp: 2 Inhaler, Rfl: 4 .  magic mouthwash w/lidocaine SOLN, Take 5 mLs by mouth 3 (three) times daily as needed for mouth pain. benadryl 525 mg, hydrocortisone 60 mg and nystatin 0.6 mg. (Patient not taking: Reported on 03/04/2019), Disp: 240 mL, Rfl: 6 .  rosuvastatin (CRESTOR) 10 MG tablet, TAKE 1 TABLET BY MOUTH ONCE DAILY (Patient not taking: Reported on 02/05/2019), Disp: 90 tablet, Rfl: 1  Allergies:  Allergies  Allergen Reactions  . Bee Venom Anaphylaxis  . Clindamycin/Lincomycin Hives    Past Medical History, Surgical history, Social history, and Family History were reviewed and updated.  Review of Systems: Review of Systems  Constitutional: Negative.   HENT:  Negative.   Eyes: Negative.   Respiratory: Positive for cough.   Cardiovascular: Negative.   Gastrointestinal: Negative.   Endocrine: Negative.   Genitourinary: Negative.    Musculoskeletal: Positive for back pain, myalgias and neck pain.  Skin: Positive for itching and rash.  Neurological: Negative.   Hematological: Negative.   Psychiatric/Behavioral: Negative.     Physical Exam:  weight is 206 lb 1.3 oz (93.5 kg). Taylor Elliott oral temperature is 97.4 F (36.3 C) (abnormal). Taylor Elliott blood pressure is 126/60 and Taylor Elliott pulse is 85. Taylor Elliott respiration is 17 and oxygen saturation is 100%.   Wt Readings from Last 3 Encounters:  03/04/19 206 lb 1.3 oz (93.5 kg)  03/02/19 208 lb 6 oz (94.5 kg)  02/11/19 206 lb 1.3 oz (93.5 kg)    Physical Exam Vitals signs reviewed.  HENT:     Head: Normocephalic and atraumatic.  Eyes:     Pupils: Pupils are equal, round, and reactive to light.  Neck:     Musculoskeletal: Normal range of motion.  Cardiovascular:     Rate and Rhythm: Normal rate and regular rhythm.     Heart sounds: Normal heart sounds.  Pulmonary:     Effort: Pulmonary effort is normal.     Breath sounds: Normal breath sounds.  Abdominal:     General:  Bowel sounds are normal.     Palpations: Abdomen is soft.     Comments: Abdominal exam shows a slightly obese abdomen.  Taylor Elliott does have an umbilical hernia.  I really cannot palpate Taylor Elliott liver at this point.  There is no fluid wave.  There is no inguinal adenopathy.  There is no splenomegaly.    Musculoskeletal: Normal range of motion.        General: No tenderness or deformity.  Lymphadenopathy:     Cervical: No cervical adenopathy.  Skin:    General: Skin is warm and dry.  Findings: No erythema or rash.  Neurological:     Mental Status: Taylor Elliott is alert and oriented to person, place, and time.  Psychiatric:        Behavior: Behavior normal.        Thought Content: Thought content normal.        Judgment: Judgment normal.      Lab Results  Component Value Date   WBC 5.0 03/04/2019   HGB 13.3 03/04/2019   HCT 39.8 03/04/2019   MCV 93.6 03/04/2019   PLT 210 03/04/2019     Chemistry      Component Value Date/Time   NA 133 (L) 03/04/2019 0915   NA 135 (L) 07/12/2015 0813   K 4.6 03/04/2019 0915   K 4.5 07/12/2015 0813   CL 100 03/04/2019 0915   CO2 26 03/04/2019 0915   CO2 24 07/12/2015 0813   BUN 18 03/04/2019 0915   BUN 15.7 07/12/2015 0813   CREATININE 1.21 03/04/2019 0915   CREATININE 1.6 (H) 07/12/2015 0813      Component Value Date/Time   CALCIUM 9.5 03/04/2019 0915   CALCIUM 10.1 07/12/2015 0813   ALKPHOS 69 03/04/2019 0915   ALKPHOS 115 07/12/2015 0813   AST 18 03/04/2019 0915   AST 22 07/12/2015 0813   ALT 20 03/04/2019 0915   ALT 24 07/12/2015 0813   BILITOT 0.3 03/04/2019 0915   BILITOT 0.46 07/12/2015 0813       Impression and Plan: Taylor Elliott is a 68 year old white male.  Taylor Elliott has a past history of renal cell carcinoma and also prostate cancer.  Now, Taylor Elliott has a third malignancy.  This is metastatic small cell lung cancer.  We now are doing maintenance immunotherapy.  Again, which Taylor Elliott will had Taylor Elliott PET scan now.  Unless her one is scheduled for 2 weeks.   Hopefully, Taylor Elliott will start Taylor Elliott radiation therapy to the brain next week.  I really think that this will help.  Lesions are very, very small so radiation should be able to eradicate them.  I realize that Taylor Elliott always has a risk of recurrence.  We probably will do another PET scan of Taylor Elliott body in September.  We will have him come back in another 3 weeks.  I spent about 30 minutes with the and Taylor Elliott today.  Taylor Elliott was on the cell phone.  I reviewed the MRI report.  I went over Taylor Elliott lab work.  Answered all their questions.    Volanda Napoleon, MD 8/5/202010:08 AM

## 2019-03-05 ENCOUNTER — Other Ambulatory Visit (INDEPENDENT_AMBULATORY_CARE_PROVIDER_SITE_OTHER): Payer: Self-pay | Admitting: Orthopedic Surgery

## 2019-03-05 ENCOUNTER — Ambulatory Visit (INDEPENDENT_AMBULATORY_CARE_PROVIDER_SITE_OTHER): Payer: 59 | Admitting: Orthopedic Surgery

## 2019-03-05 ENCOUNTER — Encounter: Payer: Self-pay | Admitting: Orthopedic Surgery

## 2019-03-05 ENCOUNTER — Other Ambulatory Visit: Payer: Self-pay

## 2019-03-05 ENCOUNTER — Ambulatory Visit
Admission: RE | Admit: 2019-03-05 | Discharge: 2019-03-05 | Disposition: A | Discharge status: Home / Self Care | Payer: 59 | Source: Ambulatory Visit | Attending: Radiation Oncology | Admitting: Radiation Oncology

## 2019-03-05 VITALS — Ht 68.0 in | Wt 206.0 lb

## 2019-03-05 DIAGNOSIS — Z89432 Acquired absence of left foot: Secondary | ICD-10-CM | POA: Diagnosis not present

## 2019-03-05 DIAGNOSIS — C7949 Secondary malignant neoplasm of other parts of nervous system: Secondary | ICD-10-CM

## 2019-03-05 DIAGNOSIS — C3491 Malignant neoplasm of unspecified part of right bronchus or lung: Secondary | ICD-10-CM | POA: Diagnosis not present

## 2019-03-05 DIAGNOSIS — C7931 Secondary malignant neoplasm of brain: Secondary | ICD-10-CM | POA: Diagnosis not present

## 2019-03-05 DIAGNOSIS — L97511 Non-pressure chronic ulcer of other part of right foot limited to breakdown of skin: Secondary | ICD-10-CM | POA: Diagnosis not present

## 2019-03-05 DIAGNOSIS — Z510 Encounter for antineoplastic radiation therapy: Secondary | ICD-10-CM | POA: Diagnosis not present

## 2019-03-05 NOTE — Progress Notes (Signed)
  Radiation Oncology         [phone] ________________________________  Name: Taylor Elliott MRN: [id number]  Date: 03/05/2019  DOB: [birth year]  SIMULATION AND TREATMENT PLANNING NOTE    ICD-10-CM   1. Secondary malignant neoplasm of brain and spinal cord ([place])  C79.31    C79.49     DIAGNOSIS:  68 y.o. male with Extensive stage small cell lung cancer  NARRATIVE:  The patient was brought to the [facility].  Identity was confirmed.  All relevant records and images related to the planned course of therapy were reviewed.  The patient freely provided informed written consent to proceed with treatment after reviewing the details related to the planned course of therapy. The consent form was witnessed and verified by the simulation staff.  Then, the patient was set-up in a stable reproducible  supine position for radiation therapy.  CT images were obtained.  Surface markings were placed.  The CT images were loaded into the planning software.  Then the target and avoidance structures were contoured.  Treatment planning then occurred.  The radiation prescription was entered and confirmed.  Then, I designed and supervised the construction of a total of 5 medically necessary complex treatment devices.  I have requested : Isodose Plan.  I have ordered:dose calc.  PLAN:  The patient will receive 35 Gy in 14 fractions directed at the whole brain.  -----------------------------------  Blair Promise, PhD, MD  This document serves as a record of services personally performed by Gery Pray, MD. It was created on his behalf by Rae Lips, a trained medical scribe. The creation of this record is based on the scribe's personal observations and the provider's statements to them. This document has been checked and approved by the attending provider.

## 2019-03-06 MED FILL — ALLOPURINOL 100 MG TABS: 100 | 90 days supply | Qty: 180 | Fill #0

## 2019-03-09 DIAGNOSIS — Z510 Encounter for antineoplastic radiation therapy: Secondary | ICD-10-CM | POA: Diagnosis not present

## 2019-03-09 DIAGNOSIS — C7931 Secondary malignant neoplasm of brain: Secondary | ICD-10-CM | POA: Diagnosis not present

## 2019-03-09 DIAGNOSIS — C3491 Malignant neoplasm of unspecified part of right bronchus or lung: Secondary | ICD-10-CM | POA: Diagnosis not present

## 2019-03-10 ENCOUNTER — Ambulatory Visit
Admission: RE | Admit: 2019-03-10 | Discharge: 2019-03-10 | Disposition: A | Discharge status: Home / Self Care | Payer: 59 | Source: Ambulatory Visit | Attending: Radiation Oncology | Admitting: Radiation Oncology

## 2019-03-10 ENCOUNTER — Other Ambulatory Visit: Payer: Self-pay

## 2019-03-10 DIAGNOSIS — C3491 Malignant neoplasm of unspecified part of right bronchus or lung: Secondary | ICD-10-CM | POA: Diagnosis not present

## 2019-03-10 DIAGNOSIS — C7931 Secondary malignant neoplasm of brain: Secondary | ICD-10-CM | POA: Diagnosis not present

## 2019-03-10 DIAGNOSIS — Z510 Encounter for antineoplastic radiation therapy: Secondary | ICD-10-CM | POA: Diagnosis not present

## 2019-03-10 MED ORDER — SONAFINE EX EMUL
1.0000 "application " | Freq: Two times a day (BID) | CUTANEOUS | Status: DC
  Administered 2019-03-10: 1 via TOPICAL

## 2019-03-10 NOTE — Progress Notes (Signed)
  Radiation Oncology         [phone] ________________________________  Name: Taylor Elliott MRN: [id number]  Date: 03/10/2019  DOB: 02-22-1951  Simulation Verification Note    ICD-10-CM   1. Secondary malignant neoplasm of brain and spinal cord ([place])  C79.31    C79.49     Status: outpatient  NARRATIVE: The patient was brought to the treatment unit and placed in the planned treatment position. The clinical setup was verified. Then port films were obtained and uploaded to the radiation oncology medical record software.  The treatment beams were carefully compared against the planned radiation fields. The position location and shape of the radiation fields was reviewed. They targeted volume of tissue appears to be appropriately covered by the radiation beams. Organs at risk appear to be excluded as planned.  Based on my personal review, I approved the simulation verification. The patient's treatment will proceed as planned.  -----------------------------------  Blair Promise, PhD, MD

## 2019-03-10 NOTE — Addendum Note (Signed)
Encounter addended by: Loma Sousa, RN on: 03/10/2019 12:02 PM  Actions taken: MAR administration accepted

## 2019-03-10 NOTE — Addendum Note (Signed)
Encounter addended by: Loma Sousa, RN on: 03/10/2019 11:53 AM  Actions taken: Diagnosis association updated, Order list changed

## 2019-03-11 ENCOUNTER — Other Ambulatory Visit: Payer: Self-pay

## 2019-03-11 ENCOUNTER — Ambulatory Visit
Admission: RE | Admit: 2019-03-11 | Discharge: 2019-03-11 | Disposition: A | Discharge status: Home / Self Care | Payer: 59 | Source: Ambulatory Visit | Attending: Radiation Oncology | Admitting: Radiation Oncology

## 2019-03-11 DIAGNOSIS — Z510 Encounter for antineoplastic radiation therapy: Secondary | ICD-10-CM | POA: Diagnosis not present

## 2019-03-11 DIAGNOSIS — C7931 Secondary malignant neoplasm of brain: Secondary | ICD-10-CM | POA: Diagnosis not present

## 2019-03-11 DIAGNOSIS — C3491 Malignant neoplasm of unspecified part of right bronchus or lung: Secondary | ICD-10-CM | POA: Diagnosis not present

## 2019-03-12 ENCOUNTER — Ambulatory Visit
Admission: RE | Admit: 2019-03-12 | Discharge: 2019-03-12 | Disposition: A | Discharge status: Home / Self Care | Payer: 59 | Source: Ambulatory Visit | Attending: Radiation Oncology | Admitting: Radiation Oncology

## 2019-03-12 ENCOUNTER — Other Ambulatory Visit: Payer: Self-pay

## 2019-03-12 DIAGNOSIS — Z510 Encounter for antineoplastic radiation therapy: Secondary | ICD-10-CM | POA: Diagnosis not present

## 2019-03-12 DIAGNOSIS — C7931 Secondary malignant neoplasm of brain: Secondary | ICD-10-CM | POA: Diagnosis not present

## 2019-03-12 DIAGNOSIS — C3491 Malignant neoplasm of unspecified part of right bronchus or lung: Secondary | ICD-10-CM | POA: Diagnosis not present

## 2019-03-13 ENCOUNTER — Other Ambulatory Visit: Payer: Self-pay

## 2019-03-13 ENCOUNTER — Ambulatory Visit
Admission: RE | Admit: 2019-03-13 | Discharge: 2019-03-13 | Disposition: A | Discharge status: Home / Self Care | Payer: 59 | Source: Ambulatory Visit | Attending: Radiation Oncology | Admitting: Radiation Oncology

## 2019-03-13 DIAGNOSIS — C7931 Secondary malignant neoplasm of brain: Secondary | ICD-10-CM | POA: Diagnosis not present

## 2019-03-13 DIAGNOSIS — C3491 Malignant neoplasm of unspecified part of right bronchus or lung: Secondary | ICD-10-CM | POA: Diagnosis not present

## 2019-03-13 DIAGNOSIS — Z510 Encounter for antineoplastic radiation therapy: Secondary | ICD-10-CM | POA: Diagnosis not present

## 2019-03-16 ENCOUNTER — Other Ambulatory Visit: Payer: Self-pay

## 2019-03-16 ENCOUNTER — Ambulatory Visit
Admission: RE | Admit: 2019-03-16 | Discharge: 2019-03-16 | Disposition: A | Discharge status: Home / Self Care | Payer: 59 | Source: Ambulatory Visit | Attending: Radiation Oncology | Admitting: Radiation Oncology

## 2019-03-16 DIAGNOSIS — C7931 Secondary malignant neoplasm of brain: Secondary | ICD-10-CM | POA: Diagnosis not present

## 2019-03-16 DIAGNOSIS — Z510 Encounter for antineoplastic radiation therapy: Secondary | ICD-10-CM | POA: Diagnosis not present

## 2019-03-16 DIAGNOSIS — C3491 Malignant neoplasm of unspecified part of right bronchus or lung: Secondary | ICD-10-CM | POA: Diagnosis not present

## 2019-03-17 ENCOUNTER — Ambulatory Visit
Admission: RE | Admit: 2019-03-17 | Discharge: 2019-03-17 | Disposition: A | Discharge status: Home / Self Care | Payer: 59 | Source: Ambulatory Visit | Attending: Radiation Oncology | Admitting: Radiation Oncology

## 2019-03-17 ENCOUNTER — Other Ambulatory Visit: Payer: Self-pay

## 2019-03-17 DIAGNOSIS — C3491 Malignant neoplasm of unspecified part of right bronchus or lung: Secondary | ICD-10-CM | POA: Diagnosis not present

## 2019-03-17 DIAGNOSIS — C7931 Secondary malignant neoplasm of brain: Secondary | ICD-10-CM | POA: Diagnosis not present

## 2019-03-17 DIAGNOSIS — Z510 Encounter for antineoplastic radiation therapy: Secondary | ICD-10-CM | POA: Diagnosis not present

## 2019-03-18 ENCOUNTER — Ambulatory Visit
Admission: RE | Admit: 2019-03-18 | Discharge: 2019-03-18 | Disposition: A | Discharge status: Home / Self Care | Payer: 59 | Source: Ambulatory Visit | Attending: Radiation Oncology | Admitting: Radiation Oncology

## 2019-03-18 ENCOUNTER — Telehealth: Payer: Self-pay | Admitting: Hematology & Oncology

## 2019-03-18 ENCOUNTER — Other Ambulatory Visit: Payer: Self-pay

## 2019-03-18 DIAGNOSIS — Z510 Encounter for antineoplastic radiation therapy: Secondary | ICD-10-CM | POA: Diagnosis not present

## 2019-03-18 DIAGNOSIS — C3491 Malignant neoplasm of unspecified part of right bronchus or lung: Secondary | ICD-10-CM | POA: Diagnosis not present

## 2019-03-18 DIAGNOSIS — C7931 Secondary malignant neoplasm of brain: Secondary | ICD-10-CM | POA: Diagnosis not present

## 2019-03-18 NOTE — Telephone Encounter (Signed)
Faxed medical records to: [facility]  F: [phone] P: [phone]    Taylor Elliott April 15, 1951 REF: [id number] 10/24/2018-03/10/2019

## 2019-03-19 ENCOUNTER — Ambulatory Visit
Admission: RE | Admit: 2019-03-19 | Discharge: 2019-03-19 | Disposition: A | Discharge status: Home / Self Care | Payer: 59 | Source: Ambulatory Visit | Attending: Radiation Oncology | Admitting: Radiation Oncology

## 2019-03-19 ENCOUNTER — Other Ambulatory Visit: Payer: Self-pay

## 2019-03-19 DIAGNOSIS — Z510 Encounter for antineoplastic radiation therapy: Secondary | ICD-10-CM | POA: Diagnosis not present

## 2019-03-19 DIAGNOSIS — C3491 Malignant neoplasm of unspecified part of right bronchus or lung: Secondary | ICD-10-CM | POA: Diagnosis not present

## 2019-03-19 DIAGNOSIS — C7931 Secondary malignant neoplasm of brain: Secondary | ICD-10-CM | POA: Diagnosis not present

## 2019-03-20 ENCOUNTER — Ambulatory Visit
Admission: RE | Admit: 2019-03-20 | Discharge: 2019-03-20 | Disposition: A | Discharge status: Home / Self Care | Payer: 59 | Source: Ambulatory Visit | Attending: Radiation Oncology | Admitting: Radiation Oncology

## 2019-03-20 ENCOUNTER — Other Ambulatory Visit: Payer: Self-pay

## 2019-03-20 DIAGNOSIS — C3491 Malignant neoplasm of unspecified part of right bronchus or lung: Secondary | ICD-10-CM | POA: Diagnosis not present

## 2019-03-20 DIAGNOSIS — Z510 Encounter for antineoplastic radiation therapy: Secondary | ICD-10-CM | POA: Diagnosis not present

## 2019-03-20 DIAGNOSIS — C7931 Secondary malignant neoplasm of brain: Secondary | ICD-10-CM | POA: Diagnosis not present

## 2019-03-23 ENCOUNTER — Other Ambulatory Visit: Payer: Self-pay

## 2019-03-23 ENCOUNTER — Ambulatory Visit
Admission: RE | Admit: 2019-03-23 | Discharge: 2019-03-23 | Disposition: A | Discharge status: Home / Self Care | Payer: 59 | Source: Ambulatory Visit | Attending: Radiation Oncology | Admitting: Radiation Oncology

## 2019-03-23 ENCOUNTER — Encounter: Payer: Self-pay | Admitting: Orthopedic Surgery

## 2019-03-23 DIAGNOSIS — Z510 Encounter for antineoplastic radiation therapy: Secondary | ICD-10-CM | POA: Diagnosis not present

## 2019-03-23 DIAGNOSIS — C3491 Malignant neoplasm of unspecified part of right bronchus or lung: Secondary | ICD-10-CM | POA: Diagnosis not present

## 2019-03-23 DIAGNOSIS — C7931 Secondary malignant neoplasm of brain: Secondary | ICD-10-CM | POA: Diagnosis not present
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
## 2019-03-24 ENCOUNTER — Other Ambulatory Visit: Payer: Self-pay

## 2019-03-24 ENCOUNTER — Ambulatory Visit
Admission: RE | Admit: 2019-03-24 | Discharge: 2019-03-24 | Disposition: A | Discharge status: Home / Self Care | Payer: 59 | Source: Ambulatory Visit | Attending: Radiation Oncology | Admitting: Radiation Oncology

## 2019-03-24 ENCOUNTER — Encounter: Payer: Self-pay | Admitting: Family

## 2019-03-24 ENCOUNTER — Encounter: Payer: Self-pay | Admitting: Family Medicine

## 2019-03-24 ENCOUNTER — Other Ambulatory Visit: Payer: Self-pay | Admitting: *Deleted

## 2019-03-24 DIAGNOSIS — C3491 Malignant neoplasm of unspecified part of right bronchus or lung: Secondary | ICD-10-CM

## 2019-03-24 DIAGNOSIS — C7931 Secondary malignant neoplasm of brain: Secondary | ICD-10-CM | POA: Diagnosis not present

## 2019-03-24 DIAGNOSIS — Z510 Encounter for antineoplastic radiation therapy: Secondary | ICD-10-CM | POA: Diagnosis not present

## 2019-03-24 DIAGNOSIS — M109 Gout, unspecified: Secondary | ICD-10-CM

## 2019-03-25 ENCOUNTER — Inpatient Hospital Stay: Payer: 59

## 2019-03-25 ENCOUNTER — Ambulatory Visit
Admission: RE | Admit: 2019-03-25 | Discharge: 2019-03-25 | Disposition: A | Discharge status: Home / Self Care | Payer: 59 | Source: Ambulatory Visit | Attending: Radiation Oncology | Admitting: Radiation Oncology

## 2019-03-25 ENCOUNTER — Encounter: Payer: Self-pay | Admitting: Hematology & Oncology

## 2019-03-25 ENCOUNTER — Other Ambulatory Visit: Payer: Self-pay

## 2019-03-25 ENCOUNTER — Inpatient Hospital Stay: Payer: 59 | Admitting: Hematology & Oncology

## 2019-03-25 ENCOUNTER — Encounter: Payer: Self-pay | Admitting: *Deleted

## 2019-03-25 DIAGNOSIS — C3491 Malignant neoplasm of unspecified part of right bronchus or lung: Secondary | ICD-10-CM | POA: Diagnosis not present

## 2019-03-25 DIAGNOSIS — R05 Cough: Secondary | ICD-10-CM | POA: Diagnosis not present

## 2019-03-25 DIAGNOSIS — M109 Gout, unspecified: Secondary | ICD-10-CM

## 2019-03-25 DIAGNOSIS — R059 Cough, unspecified: Secondary | ICD-10-CM

## 2019-03-25 DIAGNOSIS — Z510 Encounter for antineoplastic radiation therapy: Secondary | ICD-10-CM | POA: Diagnosis not present

## 2019-03-25 DIAGNOSIS — C7931 Secondary malignant neoplasm of brain: Secondary | ICD-10-CM | POA: Diagnosis not present

## 2019-03-25 LAB — CMP (CANCER CENTER ONLY)
ALT: 18 U/L (ref 0–44)
AST: 17 U/L (ref 15–41)
Albumin: 3.9 g/dL (ref 3.5–5.0)
Alkaline Phosphatase: 69 U/L (ref 38–126)
Anion gap: 7 (ref 5–15)
BUN: 19 mg/dL (ref 8–23)
CO2: 25 mmol/L (ref 22–32)
Calcium: 9.2 mg/dL (ref 8.9–10.3)
Chloride: 100 mmol/L (ref 98–111)
Creatinine: 1.14 mg/dL (ref 0.61–1.24)
GFR, Est AFR Am: >60 mL/min (ref >60)
GFR, Estimated: >60 mL/min (ref >60)
Glucose, Bld: 105 mg/dL — ABNORMAL HIGH (ref 70–99)
Potassium: 4.2 mmol/L (ref 3.5–5.1)
Sodium: 132 mmol/L — ABNORMAL LOW (ref 135–145)
Total Bilirubin: 0.4 mg/dL (ref 0.3–1.2)
Total Protein: 6.4 g/dL — ABNORMAL LOW (ref 6.5–8.1)

## 2019-03-25 LAB — CBC WITH DIFFERENTIAL (CANCER CENTER ONLY)
Abs Immature Granulocytes: 0.01 10*3/uL (ref 0.00–0.07)
Basophils Absolute: 0 10*3/uL (ref 0.0–0.1)
Basophils Relative: 1 %
Eosinophils Absolute: 0.2 10*3/uL (ref 0.0–0.5)
Eosinophils Relative: 4 %
HCT: 37.5 % — ABNORMAL LOW (ref 39.0–52.0)
Hemoglobin: 12.6 g/dL — ABNORMAL LOW (ref 13.0–17.0)
Immature Granulocytes: 0 %
Lymphocytes Relative: 10 %
Lymphs Abs: 0.4 10*3/uL — ABNORMAL LOW (ref 0.7–4.0)
MCH: 30.8 pg (ref 26.0–34.0)
MCHC: 33.6 g/dL (ref 30.0–36.0)
MCV: 91.7 fL (ref 80.0–100.0)
Monocytes Absolute: 0.5 10*3/uL (ref 0.1–1.0)
Monocytes Relative: 12 %
Neutro Abs: 3 10*3/uL (ref 1.7–7.7)
Neutrophils Relative %: 73 %
Platelet Count: 183 10*3/uL (ref 150–400)
RBC: 4.09 MIL/uL — ABNORMAL LOW (ref 4.22–5.81)
RDW: 13.3 % (ref 11.5–15.5)
WBC Count: 4.1 10*3/uL (ref 4.0–10.5)
nRBC: 0 % (ref 0.0–0.2)

## 2019-03-25 LAB — LACTATE DEHYDROGENASE: LDH: 118 U/L (ref 98–192)

## 2019-03-25 LAB — URIC ACID: Uric Acid, Serum: 6 mg/dL (ref 3.7–8.6)

## 2019-03-25 MED ORDER — SODIUM CHLORIDE 0.9 % IV SOLN
1200.0000 mg | Freq: Once | INTRAVENOUS | Status: AC
  Administered 2019-03-25: 1200 mg via INTRAVENOUS
  Filled 2019-03-25: qty 20

## 2019-03-25 MED ORDER — SODIUM CHLORIDE 0.9% FLUSH
10.0000 mL | INTRAVENOUS | Status: DC | PRN
  Administered 2019-03-25: 10 mL
  Filled 2019-03-25: qty 10

## 2019-03-25 MED ORDER — HEPARIN SOD (PORK) LOCK FLUSH 100 UNIT/ML IV SOLN
500.0000 [IU] | Freq: Once | INTRAVENOUS | Status: AC | PRN
  Administered 2019-03-25: 500 [IU]
  Filled 2019-03-25: qty 5

## 2019-03-25 MED ORDER — SODIUM CHLORIDE 0.9 % IV SOLN
Freq: Once | INTRAVENOUS | Status: AC
  Administered 2019-03-25: 10:00:00 via INTRAVENOUS
  Filled 2019-03-25: qty 250

## 2019-03-25 MED ORDER — HYDROCODONE-HOMATROPINE 5-1.5 MG/5ML PO SYRP: 5.0000 mL | ORAL_SOLUTION | ORAL | 0 refills | Status: DC | PRN

## 2019-03-25 MED FILL — HYDROCODONE-HOMATROPINE SOL: 5-1.5 | 13 days supply | Qty: 250 | Fill #0
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
## 2019-03-26 ENCOUNTER — Ambulatory Visit
Admission: RE | Admit: 2019-03-26 | Discharge: 2019-03-26 | Disposition: A | Discharge status: Home / Self Care | Payer: 59 | Source: Ambulatory Visit | Attending: Radiation Oncology | Admitting: Radiation Oncology

## 2019-03-26 ENCOUNTER — Other Ambulatory Visit: Payer: Self-pay

## 2019-03-26 DIAGNOSIS — C3491 Malignant neoplasm of unspecified part of right bronchus or lung: Secondary | ICD-10-CM | POA: Diagnosis not present

## 2019-03-26 DIAGNOSIS — Z510 Encounter for antineoplastic radiation therapy: Secondary | ICD-10-CM | POA: Diagnosis not present

## 2019-03-26 DIAGNOSIS — C7931 Secondary malignant neoplasm of brain: Secondary | ICD-10-CM | POA: Diagnosis not present

## 2019-03-27 ENCOUNTER — Ambulatory Visit
Admission: RE | Admit: 2019-03-27 | Discharge: 2019-03-27 | Disposition: A | Discharge status: Home / Self Care | Payer: 59 | Source: Ambulatory Visit | Attending: Radiation Oncology | Admitting: Radiation Oncology

## 2019-03-27 ENCOUNTER — Encounter: Payer: Self-pay | Admitting: Radiation Oncology

## 2019-03-27 ENCOUNTER — Other Ambulatory Visit: Payer: Self-pay

## 2019-03-27 DIAGNOSIS — Z510 Encounter for antineoplastic radiation therapy: Secondary | ICD-10-CM | POA: Diagnosis not present

## 2019-03-27 DIAGNOSIS — C7931 Secondary malignant neoplasm of brain: Secondary | ICD-10-CM | POA: Diagnosis not present

## 2019-03-27 DIAGNOSIS — C3491 Malignant neoplasm of unspecified part of right bronchus or lung: Secondary | ICD-10-CM | POA: Diagnosis not present

## 2019-03-30 ENCOUNTER — Other Ambulatory Visit (HOSPITAL_COMMUNITY)
Admission: RE | Admit: 2019-03-30 | Discharge: 2019-03-30 | Disposition: A | Discharge status: Home / Self Care | Payer: 59 | Source: Ambulatory Visit | Attending: Ophthalmology | Admitting: Ophthalmology

## 2019-03-30 ENCOUNTER — Encounter (INDEPENDENT_AMBULATORY_CARE_PROVIDER_SITE_OTHER): Payer: 59 | Admitting: Ophthalmology

## 2019-03-30 ENCOUNTER — Encounter: Payer: Self-pay | Admitting: Radiation Oncology

## 2019-03-30 ENCOUNTER — Other Ambulatory Visit: Payer: Self-pay

## 2019-03-30 ENCOUNTER — Encounter (HOSPITAL_COMMUNITY): Payer: Self-pay | Admitting: *Deleted

## 2019-03-30 ENCOUNTER — Telehealth: Payer: Self-pay

## 2019-03-30 DIAGNOSIS — Z20828 Contact with and (suspected) exposure to other viral communicable diseases: Secondary | ICD-10-CM | POA: Diagnosis not present

## 2019-03-30 DIAGNOSIS — H4312 Vitreous hemorrhage, left eye: Secondary | ICD-10-CM | POA: Diagnosis not present

## 2019-03-30 DIAGNOSIS — H43813 Vitreous degeneration, bilateral: Secondary | ICD-10-CM | POA: Diagnosis not present

## 2019-03-30 DIAGNOSIS — I10 Essential (primary) hypertension: Secondary | ICD-10-CM | POA: Diagnosis not present

## 2019-03-30 DIAGNOSIS — H338 Other retinal detachments: Secondary | ICD-10-CM

## 2019-03-30 DIAGNOSIS — Z01812 Encounter for preprocedural laboratory examination: Secondary | ICD-10-CM | POA: Diagnosis not present

## 2019-03-30 DIAGNOSIS — H33001 Unspecified retinal detachment with retinal break, right eye: Secondary | ICD-10-CM | POA: Diagnosis present

## 2019-03-30 DIAGNOSIS — H35033 Hypertensive retinopathy, bilateral: Secondary | ICD-10-CM | POA: Diagnosis not present
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
## 2019-03-30 NOTE — Anesthesia Preprocedure Evaluation (Addendum)
Anesthesia Evaluation  Patient identified by MRN, date of birth, ID band Patient awake    Airway Mallampati: II  TM Distance: >3 FB     Dental  (+) Teeth Intact, Dental Advisory Given   Pulmonary Current Smoker,    breath sounds clear to auscultation       Cardiovascular hypertension, Pt. on medications  Rhythm:Regular Rate:Normal     Neuro/Psych    GI/Hepatic GERD  ,  Endo/Other    Renal/GU Renal disease     Musculoskeletal   Abdominal   Peds  Hematology   Anesthesia Other Findings   Reproductive/Obstetrics                           Anesthesia Physical Anesthesia Plan  ASA: III  Anesthesia Plan: General   Post-op Pain Management:    Induction: Intravenous  PONV Risk Score and Plan: Ondansetron, Dexamethasone, Treatment may vary due to age or medical condition and Midazolam  Airway Management Planned: Oral ETT  Additional Equipment:   Intra-op Plan:   Post-operative Plan: Extubation in OR  Informed Consent: I have reviewed the patients History and Physical, chart, labs and discussed the procedure including the risks, benefits and alternatives for the proposed anesthesia with the patient or authorized representative who has indicated his/her understanding and acceptance.     Dental advisory given  Plan Discussed with: CRNA and Anesthesiologist  Anesthesia Plan Comments: (PAT note written 03/30/2019 by Myra Gianotti, PA-C. )      Anesthesia Quick Evaluation
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
## 2019-03-30 NOTE — Telephone Encounter (Signed)
Pt called to report that having a dark spot in upper RIGHT eye while eye is open it seems that eyelid is still half closed and this last 2-3 seconds with each occurrence.   Dr. Tempie Hoist [phone] retina and vitreous specialist that pt saw for floaters in LEFT eye. Pt is now having some floaters in RIGHT eye on Friday-Sunday. No floaters in RIGHT eye since Sunday.   Encouraged pt to make appt with Dr. Zigmund Daniel for vision changes and conveyed to pt that this RN would discuss with Dr. Sondra Come and contact pt back. Pt verbalized understanding and agreement. Loma Sousa, RN BSN

## 2019-03-30 NOTE — Progress Notes (Signed)
Spoke with pt for pre-op call. Pt denies cardiac history or diabetes. Pt is treated for HTN. Pt has recently finished 14 rounds of radiation therapy for small cell lung cancer on 03/27/19. Last Immunotherapy was 03/25/19, he gets this every 21 days.  Pt states he was instructed by Dr. Zigmund Daniel to arrive at 9 AM Tuesday morning for surgery.   Pt has had Covid test done today. Pt understands he needs to quarantine until surgery tomorrow.    Coronavirus Screening  Have you experienced the following symptoms:  Cough yes/no: No Fever (>100.42F)  yes/no: No Runny nose yes/no: No Sore throat yes/no: No Difficulty breathing/shortness of breath  yes/no: No  Have you or a family member traveled in the last 14 days and where? yes/no: No   Patient reminded that hospital visitation restrictions are in effect and the importance of the restrictions. Pt informed that he may have 1 visitor wait in the waiting room while he is in pre-op, surgery and PACU. Pt's one visitor my visit him once he is admitted during visiting hours only. Pt voiced understanding.

## 2019-03-31 ENCOUNTER — Encounter (HOSPITAL_COMMUNITY): Payer: Self-pay

## 2019-03-31 ENCOUNTER — Ambulatory Visit (HOSPITAL_COMMUNITY): Payer: 59 | Admitting: Certified Registered Nurse Anesthetist

## 2019-03-31 ENCOUNTER — Ambulatory Visit (HOSPITAL_COMMUNITY)
Admission: RE | Admit: 2019-03-31 | Discharge: 2019-04-01 | Disposition: A | Discharge status: Home / Self Care | Payer: 59 | Attending: Ophthalmology | Admitting: Ophthalmology

## 2019-03-31 ENCOUNTER — Encounter (HOSPITAL_COMMUNITY)
Admission: RE | Disposition: A | Discharge status: Home / Self Care | Payer: Self-pay | Source: Home / Self Care | Attending: Ophthalmology

## 2019-03-31 DIAGNOSIS — C3490 Malignant neoplasm of unspecified part of unspecified bronchus or lung: Secondary | ICD-10-CM | POA: Insufficient documentation

## 2019-03-31 DIAGNOSIS — Z905 Acquired absence of kidney: Secondary | ICD-10-CM | POA: Diagnosis not present

## 2019-03-31 DIAGNOSIS — I129 Hypertensive chronic kidney disease with stage 1 through stage 4 chronic kidney disease, or unspecified chronic kidney disease: Secondary | ICD-10-CM | POA: Insufficient documentation

## 2019-03-31 DIAGNOSIS — Z8546 Personal history of malignant neoplasm of prostate: Secondary | ICD-10-CM | POA: Diagnosis not present

## 2019-03-31 DIAGNOSIS — H338 Other retinal detachments: Secondary | ICD-10-CM

## 2019-03-31 DIAGNOSIS — F419 Anxiety disorder, unspecified: Secondary | ICD-10-CM | POA: Insufficient documentation

## 2019-03-31 DIAGNOSIS — Z9079 Acquired absence of other genital organ(s): Secondary | ICD-10-CM | POA: Diagnosis not present

## 2019-03-31 DIAGNOSIS — H33001 Unspecified retinal detachment with retinal break, right eye: Secondary | ICD-10-CM | POA: Diagnosis not present

## 2019-03-31 DIAGNOSIS — M1990 Unspecified osteoarthritis, unspecified site: Secondary | ICD-10-CM | POA: Diagnosis not present

## 2019-03-31 DIAGNOSIS — Z7982 Long term (current) use of aspirin: Secondary | ICD-10-CM | POA: Insufficient documentation

## 2019-03-31 DIAGNOSIS — K219 Gastro-esophageal reflux disease without esophagitis: Secondary | ICD-10-CM | POA: Diagnosis not present

## 2019-03-31 DIAGNOSIS — N189 Chronic kidney disease, unspecified: Secondary | ICD-10-CM | POA: Diagnosis not present

## 2019-03-31 DIAGNOSIS — I10 Essential (primary) hypertension: Secondary | ICD-10-CM | POA: Diagnosis not present

## 2019-03-31 DIAGNOSIS — C7931 Secondary malignant neoplasm of brain: Secondary | ICD-10-CM | POA: Diagnosis not present

## 2019-03-31 DIAGNOSIS — Z85528 Personal history of other malignant neoplasm of kidney: Secondary | ICD-10-CM | POA: Diagnosis not present

## 2019-03-31 DIAGNOSIS — Z79899 Other long term (current) drug therapy: Secondary | ICD-10-CM | POA: Diagnosis not present

## 2019-03-31 DIAGNOSIS — E7800 Pure hypercholesterolemia, unspecified: Secondary | ICD-10-CM | POA: Diagnosis not present

## 2019-03-31 DIAGNOSIS — F17210 Nicotine dependence, cigarettes, uncomplicated: Secondary | ICD-10-CM | POA: Diagnosis not present

## 2019-03-31 DIAGNOSIS — F329 Major depressive disorder, single episode, unspecified: Secondary | ICD-10-CM | POA: Diagnosis not present

## 2019-03-31 DIAGNOSIS — J449 Chronic obstructive pulmonary disease, unspecified: Secondary | ICD-10-CM | POA: Diagnosis not present

## 2019-03-31 DIAGNOSIS — Z881 Allergy status to other antibiotic agents status: Secondary | ICD-10-CM | POA: Insufficient documentation

## 2019-03-31 HISTORY — DX: Chronic obstructive pulmonary disease, unspecified: J44.9

## 2019-03-31 HISTORY — PX: SCLERAL BUCKLE: SHX5340

## 2019-03-31 HISTORY — DX: Anemia, unspecified: D64.9

## 2019-03-31 HISTORY — PX: PHOTOCOAGULATION WITH LASER: SHX6027

## 2019-03-31 HISTORY — PX: GAS INSERTION: SHX5336

## 2019-03-31 LAB — SARS CORONAVIRUS 2 (TAT 6-24 HRS): SARS Coronavirus 2: NEGATIVE

## 2019-03-31 SURGERY — SCLERAL BUCKLE
Anesthesia: General | Site: Eye | Laterality: Right

## 2019-03-31 MED ORDER — BSS PLUS IO SOLN
INTRAOCULAR | Status: AC
  Filled 2019-03-31: qty 500

## 2019-03-31 MED ORDER — LACTATED RINGERS IV SOLN: INTRAVENOUS | Status: DC

## 2019-03-31 MED ORDER — LISINOPRIL 20 MG PO TABS: 20.0000 mg | ORAL_TABLET | Freq: Every day | ORAL | Status: DC

## 2019-03-31 MED ORDER — AMLODIPINE BESYLATE 10 MG PO TABS: 10.0000 mg | ORAL_TABLET | Freq: Every morning | ORAL | Status: DC

## 2019-03-31 MED ORDER — EPINEPHRINE PF 1 MG/ML IJ SOLN
INTRAOCULAR | Status: DC | PRN
  Administered 2019-03-31: 14:00:00 500.3 mL

## 2019-03-31 MED ORDER — SUCCINYLCHOLINE CHLORIDE 200 MG/10ML IV SOSY
PREFILLED_SYRINGE | INTRAVENOUS | Status: DC | PRN
  Administered 2019-03-31: 100 mg via INTRAVENOUS

## 2019-03-31 MED ORDER — BUPIVACAINE HCL (PF) 0.75 % IJ SOLN
INTRAMUSCULAR | Status: AC
  Filled 2019-03-31: qty 10

## 2019-03-31 MED ORDER — GATIFLOXACIN 0.5 % OP SOLN
1.0000 [drp] | OPHTHALMIC | Status: AC | PRN
  Administered 2019-03-31: 1 [drp] via OPHTHALMIC
  Filled 2019-03-31: qty 2.5

## 2019-03-31 MED ORDER — SODIUM CHLORIDE 0.45 % IV SOLN
INTRAVENOUS | Status: DC
  Administered 2019-03-31: 22:00:00 via INTRAVENOUS

## 2019-03-31 MED ORDER — EPHEDRINE SULFATE-NACL 50-0.9 MG/10ML-% IV SOSY
PREFILLED_SYRINGE | INTRAVENOUS | Status: DC | PRN
  Administered 2019-03-31 (×2): 5 mg via INTRAVENOUS

## 2019-03-31 MED ORDER — LIDOCAINE-PRILOCAINE 2.5-2.5 % EX CREA
1.0000 "application " | TOPICAL_CREAM | CUTANEOUS | Status: DC | PRN
  Filled 2019-03-31: qty 5

## 2019-03-31 MED ORDER — LATANOPROST 0.005 % OP SOLN
1.0000 [drp] | Freq: Every day | OPHTHALMIC | Status: DC
  Filled 2019-03-31: qty 2.5

## 2019-03-31 MED ORDER — CEFAZOLIN SODIUM-DEXTROSE 2-4 GM/100ML-% IV SOLN
2.0000 g | INTRAVENOUS | Status: AC
  Administered 2019-03-31: 2 g via INTRAVENOUS
  Filled 2019-03-31: qty 100

## 2019-03-31 MED ORDER — MORPHINE SULFATE (PF) 2 MG/ML IV SOLN
1.0000 mg | INTRAVENOUS | Status: AC | PRN
  Administered 2019-03-31 – 2019-04-01 (×2): 2 mg via INTRAVENOUS
  Filled 2019-03-31 (×3): qty 1

## 2019-03-31 MED ORDER — LIDOCAINE 2% (20 MG/ML) 5 ML SYRINGE
INTRAMUSCULAR | Status: AC
  Filled 2019-03-31: qty 5

## 2019-03-31 MED ORDER — FENTANYL CITRATE (PF) 100 MCG/2ML IJ SOLN
INTRAMUSCULAR | Status: AC
  Filled 2019-03-31: qty 2

## 2019-03-31 MED ORDER — POLYMYXIN B SULFATE 500000 UNITS IJ SOLR
INTRAMUSCULAR | Status: AC
  Filled 2019-03-31: qty 500000

## 2019-03-31 MED ORDER — TETRACAINE HCL 0.5 % OP SOLN
2.0000 [drp] | Freq: Once | OPHTHALMIC | Status: DC
  Filled 2019-03-31: qty 4

## 2019-03-31 MED ORDER — HEMOSTATIC AGENTS (NO CHARGE) OPTIME
TOPICAL | Status: DC | PRN
  Administered 2019-03-31: 1 via TOPICAL

## 2019-03-31 MED ORDER — FENTANYL CITRATE (PF) 250 MCG/5ML IJ SOLN
INTRAMUSCULAR | Status: DC | PRN
  Administered 2019-03-31: 50 ug via INTRAVENOUS
  Administered 2019-03-31: 100 ug via INTRAVENOUS
  Administered 2019-03-31: 50 ug via INTRAVENOUS

## 2019-03-31 MED ORDER — SUGAMMADEX SODIUM 200 MG/2ML IV SOLN
INTRAVENOUS | Status: DC | PRN
  Administered 2019-03-31: 190 mg via INTRAVENOUS

## 2019-03-31 MED ORDER — EPINEPHRINE PF 1 MG/ML IJ SOLN
INTRAMUSCULAR | Status: AC
  Filled 2019-03-31: qty 1

## 2019-03-31 MED ORDER — BRIMONIDINE TARTRATE 0.2 % OP SOLN
1.0000 [drp] | Freq: Two times a day (BID) | OPHTHALMIC | Status: DC
  Filled 2019-03-31: qty 5

## 2019-03-31 MED ORDER — STERILE WATER FOR INJECTION IJ SOLN
INTRAMUSCULAR | Status: DC | PRN
  Administered 2019-03-31: 20 mL

## 2019-03-31 MED ORDER — PHENYLEPHRINE 40 MCG/ML (10ML) SYRINGE FOR IV PUSH (FOR BLOOD PRESSURE SUPPORT)
PREFILLED_SYRINGE | INTRAVENOUS | Status: DC | PRN
  Administered 2019-03-31 (×2): 40 ug via INTRAVENOUS
  Administered 2019-03-31 (×4): 80 ug via INTRAVENOUS

## 2019-03-31 MED ORDER — SUCCINYLCHOLINE CHLORIDE 200 MG/10ML IV SOSY
PREFILLED_SYRINGE | INTRAVENOUS | Status: AC
  Filled 2019-03-31: qty 10

## 2019-03-31 MED ORDER — PROPOFOL 10 MG/ML IV BOLUS
INTRAVENOUS | Status: AC
  Filled 2019-03-31: qty 20

## 2019-03-31 MED ORDER — DEXAMETHASONE SODIUM PHOSPHATE 10 MG/ML IJ SOLN
INTRAMUSCULAR | Status: DC | PRN
  Administered 2019-03-31: 5 mg via INTRAVENOUS

## 2019-03-31 MED ORDER — HYDROCODONE-ACETAMINOPHEN 5-325 MG PO TABS: 1.0000 | ORAL_TABLET | Freq: Four times a day (QID) | ORAL | Status: DC | PRN

## 2019-03-31 MED ORDER — SODIUM HYALURONATE 10 MG/ML IO SOLN
INTRAOCULAR | Status: DC | PRN
  Administered 2019-03-31: 0.85 mL via INTRAOCULAR

## 2019-03-31 MED ORDER — ACETAZOLAMIDE SODIUM 500 MG IJ SOLR
INTRAMUSCULAR | Status: AC
  Filled 2019-03-31: qty 500

## 2019-03-31 MED ORDER — EPHEDRINE 5 MG/ML INJ
INTRAVENOUS | Status: AC
  Filled 2019-03-31: qty 10

## 2019-03-31 MED ORDER — ACETAZOLAMIDE SODIUM 500 MG IJ SOLR
500.0000 mg | Freq: Once | INTRAMUSCULAR | Status: AC
  Administered 2019-04-01: 500 mg via INTRAVENOUS
  Filled 2019-03-31: qty 500

## 2019-03-31 MED ORDER — LACTULOSE 10 GM/15ML PO SOLN: 10.0000 g | Freq: Two times a day (BID) | ORAL | Status: DC | PRN

## 2019-03-31 MED ORDER — CEFTAZIDIME 1 G IJ SOLR
INTRAMUSCULAR | Status: AC
  Filled 2019-03-31: qty 1

## 2019-03-31 MED ORDER — MIDAZOLAM HCL 5 MG/5ML IJ SOLN
INTRAMUSCULAR | Status: DC | PRN
  Administered 2019-03-31: 2 mg via INTRAVENOUS

## 2019-03-31 MED ORDER — MAGNESIUM HYDROXIDE 400 MG/5ML PO SUSP: 15.0000 mL | Freq: Four times a day (QID) | ORAL | Status: DC | PRN

## 2019-03-31 MED ORDER — BRIMONIDINE TARTRATE 0.15 % OP SOLN
1.0000 [drp] | Freq: Once | OPHTHALMIC | Status: AC
  Administered 2019-03-31: 1 [drp] via OPHTHALMIC
  Filled 2019-03-31: qty 5

## 2019-03-31 MED ORDER — CYCLOPENTOLATE HCL 1 % OP SOLN
1.0000 [drp] | OPHTHALMIC | Status: AC | PRN
  Administered 2019-03-31 (×2): 1 [drp] via OPHTHALMIC

## 2019-03-31 MED ORDER — GATIFLOXACIN 0.5 % OP SOLN
1.0000 [drp] | Freq: Four times a day (QID) | OPHTHALMIC | Status: DC
  Filled 2019-03-31: qty 2.5

## 2019-03-31 MED ORDER — BACITRACIN-POLYMYXIN B 500-10000 UNIT/GM OP OINT
TOPICAL_OINTMENT | OPHTHALMIC | Status: AC
  Filled 2019-03-31: qty 3.5

## 2019-03-31 MED ORDER — SODIUM CHLORIDE (PF) 0.9 % IJ SOLN
INTRAMUSCULAR | Status: AC
  Filled 2019-03-31: qty 10

## 2019-03-31 MED ORDER — TROPICAMIDE 1 % OP SOLN
1.0000 [drp] | OPHTHALMIC | Status: AC | PRN
  Administered 2019-03-31 (×2): 1 [drp] via OPHTHALMIC

## 2019-03-31 MED ORDER — DORZOLAMIDE HCL 2 % OP SOLN
1.0000 [drp] | Freq: Three times a day (TID) | OPHTHALMIC | Status: DC
  Filled 2019-03-31: qty 10

## 2019-03-31 MED ORDER — TEMAZEPAM 15 MG PO CAPS: 15.0000 mg | ORAL_CAPSULE | Freq: Every evening | ORAL | Status: DC | PRN

## 2019-03-31 MED ORDER — PHENYLEPHRINE 40 MCG/ML (10ML) SYRINGE FOR IV PUSH (FOR BLOOD PRESSURE SUPPORT)
PREFILLED_SYRINGE | INTRAVENOUS | Status: AC
  Filled 2019-03-31: qty 10

## 2019-03-31 MED ORDER — CALCIUM CARBONATE ANTACID 500 MG PO CHEW: 2.0000 | CHEWABLE_TABLET | Freq: Every day | ORAL | Status: DC | PRN

## 2019-03-31 MED ORDER — STERILE WATER FOR INJECTION IJ SOLN
INTRAMUSCULAR | Status: AC
  Filled 2019-03-31: qty 10

## 2019-03-31 MED ORDER — BACITRACIN-POLYMYXIN B 500-10000 UNIT/GM OP OINT
1.0000 "application " | TOPICAL_OINTMENT | Freq: Three times a day (TID) | OPHTHALMIC | Status: DC
  Filled 2019-03-31: qty 3.5

## 2019-03-31 MED ORDER — GATIFLOXACIN 0.5 % OP SOLN
1.0000 [drp] | OPHTHALMIC | Status: AC | PRN
  Administered 2019-03-31 (×2): 1 [drp] via OPHTHALMIC

## 2019-03-31 MED ORDER — LORAZEPAM 0.5 MG PO TABS: 0.5000 mg | ORAL_TABLET | Freq: Every evening | ORAL | Status: DC | PRN

## 2019-03-31 MED ORDER — DARIFENACIN HYDROBROMIDE ER 7.5 MG PO TB24
7.5000 mg | ORAL_TABLET | Freq: Every day | ORAL | Status: DC
  Filled 2019-03-31 (×2): qty 1

## 2019-03-31 MED ORDER — ACETAMINOPHEN 325 MG PO TABS: 325.0000 mg | ORAL_TABLET | ORAL | Status: DC | PRN

## 2019-03-31 MED ORDER — BUPIVACAINE HCL (PF) 0.75 % IJ SOLN
INTRAMUSCULAR | Status: DC | PRN
  Administered 2019-03-31: 10 mL

## 2019-03-31 MED ORDER — ACETAMINOPHEN 325 MG PO TABS: 325.0000 mg | ORAL_TABLET | Freq: Once | ORAL | Status: DC | PRN

## 2019-03-31 MED ORDER — SODIUM CHLORIDE 0.9 % IV SOLN
INTRAVENOUS | Status: DC | PRN
  Administered 2019-03-31: 16:00:00 25 ug/min via INTRAVENOUS

## 2019-03-31 MED ORDER — PROPOFOL 10 MG/ML IV BOLUS
INTRAVENOUS | Status: DC | PRN
  Administered 2019-03-31: 130 mg via INTRAVENOUS
  Administered 2019-03-31: 20 mg via INTRAVENOUS

## 2019-03-31 MED ORDER — ATROPINE SULFATE 1 % OP SOLN
OPHTHALMIC | Status: DC | PRN
  Administered 2019-03-31: 1 [drp] via OPHTHALMIC

## 2019-03-31 MED ORDER — DEXAMETHASONE SODIUM PHOSPHATE 10 MG/ML IJ SOLN
INTRAMUSCULAR | Status: AC
  Filled 2019-03-31: qty 1

## 2019-03-31 MED ORDER — ACETAMINOPHEN 10 MG/ML IV SOLN
1000.0000 mg | Freq: Once | INTRAVENOUS | Status: DC | PRN
  Administered 2019-03-31: 1000 mg via INTRAVENOUS

## 2019-03-31 MED ORDER — SODIUM HYALURONATE 10 MG/ML IO SOLN
INTRAOCULAR | Status: AC
  Filled 2019-03-31: qty 0.85

## 2019-03-31 MED ORDER — TEMAZEPAM 15 MG PO CAPS
30.0000 mg | ORAL_CAPSULE | Freq: Every evening | ORAL | Status: DC | PRN
  Administered 2019-03-31: 30 mg via ORAL
  Filled 2019-03-31: qty 2

## 2019-03-31 MED ORDER — ONDANSETRON HCL 4 MG/2ML IJ SOLN
INTRAMUSCULAR | Status: DC | PRN
  Administered 2019-03-31: 4 mg via INTRAVENOUS

## 2019-03-31 MED ORDER — HYDROCODONE-ACETAMINOPHEN 5-325 MG PO TABS
1.0000 | ORAL_TABLET | ORAL | Status: DC | PRN
  Administered 2019-03-31 – 2019-04-01 (×2): 2 via ORAL
  Filled 2019-03-31 (×2): qty 2

## 2019-03-31 MED ORDER — ATROPINE SULFATE 1 % OP SOLN
OPHTHALMIC | Status: AC
  Filled 2019-03-31: qty 5

## 2019-03-31 MED ORDER — HYDROCODONE-HOMATROPINE 5-1.5 MG/5ML PO SYRP
5.0000 mL | ORAL_SOLUTION | ORAL | Status: DC | PRN
  Administered 2019-03-31: 5 mL via ORAL
  Filled 2019-03-31 (×2): qty 5

## 2019-03-31 MED ORDER — FENTANYL CITRATE (PF) 250 MCG/5ML IJ SOLN
INTRAMUSCULAR | Status: AC
  Filled 2019-03-31: qty 5

## 2019-03-31 MED ORDER — BACITRACIN-POLYMYXIN B 500-10000 UNIT/GM OP OINT
TOPICAL_OINTMENT | OPHTHALMIC | Status: DC | PRN
  Administered 2019-03-31: 1 via OPHTHALMIC

## 2019-03-31 MED ORDER — BSS IO SOLN
INTRAOCULAR | Status: DC | PRN
  Administered 2019-03-31: 15 mL via INTRAOCULAR

## 2019-03-31 MED ORDER — ROCURONIUM BROMIDE 10 MG/ML (PF) SYRINGE
PREFILLED_SYRINGE | INTRAVENOUS | Status: AC
  Filled 2019-03-31: qty 10

## 2019-03-31 MED ORDER — LIDOCAINE HCL 2 % IJ SOLN
INTRAMUSCULAR | Status: AC
  Filled 2019-03-31: qty 20

## 2019-03-31 MED ORDER — FENTANYL CITRATE (PF) 100 MCG/2ML IJ SOLN
25.0000 ug | INTRAMUSCULAR | Status: DC | PRN
  Administered 2019-03-31 (×2): 50 ug via INTRAVENOUS
  Administered 2019-03-31 (×2): 25 ug via INTRAVENOUS

## 2019-03-31 MED ORDER — TROPICAMIDE 1 % OP SOLN
1.0000 [drp] | OPHTHALMIC | Status: AC | PRN
  Administered 2019-03-31: 1 [drp] via OPHTHALMIC
  Filled 2019-03-31: qty 15

## 2019-03-31 MED ORDER — MIDAZOLAM HCL 2 MG/2ML IJ SOLN
INTRAMUSCULAR | Status: AC
  Filled 2019-03-31: qty 2

## 2019-03-31 MED ORDER — PHENYLEPHRINE HCL 2.5 % OP SOLN
1.0000 [drp] | OPHTHALMIC | Status: AC | PRN
  Administered 2019-03-31 (×2): 1 [drp] via OPHTHALMIC

## 2019-03-31 MED ORDER — ONDANSETRON HCL 4 MG/2ML IJ SOLN
INTRAMUSCULAR | Status: AC
  Filled 2019-03-31: qty 2

## 2019-03-31 MED ORDER — ONDANSETRON HCL 4 MG/2ML IJ SOLN: 4.0000 mg | Freq: Four times a day (QID) | INTRAMUSCULAR | Status: DC | PRN

## 2019-03-31 MED ORDER — PROMETHAZINE HCL 25 MG/ML IJ SOLN: 6.2500 mg | INTRAMUSCULAR | Status: DC | PRN

## 2019-03-31 MED ORDER — TRIAMCINOLONE ACETONIDE 40 MG/ML IJ SUSP
INTRAMUSCULAR | Status: AC
  Filled 2019-03-31: qty 5

## 2019-03-31 MED ORDER — ACETAMINOPHEN 10 MG/ML IV SOLN
INTRAVENOUS | Status: AC
  Filled 2019-03-31: qty 100

## 2019-03-31 MED ORDER — ACETAMINOPHEN 160 MG/5ML PO SOLN: 325.0000 mg | Freq: Once | ORAL | Status: DC | PRN

## 2019-03-31 MED ORDER — MEPERIDINE HCL 25 MG/ML IJ SOLN: 6.2500 mg | INTRAMUSCULAR | Status: DC | PRN

## 2019-03-31 MED ORDER — FENOFIBRATE 160 MG PO TABS: 160.0000 mg | ORAL_TABLET | Freq: Every day | ORAL | Status: DC

## 2019-03-31 MED ORDER — CYCLOPENTOLATE HCL 1 % OP SOLN
1.0000 [drp] | OPHTHALMIC | Status: AC | PRN
  Administered 2019-03-31: 1 [drp] via OPHTHALMIC
  Filled 2019-03-31: qty 2

## 2019-03-31 MED ORDER — PHENYLEPHRINE HCL 2.5 % OP SOLN
1.0000 [drp] | OPHTHALMIC | Status: AC | PRN
  Administered 2019-03-31: 1 [drp] via OPHTHALMIC
  Filled 2019-03-31: qty 2

## 2019-03-31 MED ORDER — LIDOCAINE 2% (20 MG/ML) 5 ML SYRINGE
INTRAMUSCULAR | Status: DC | PRN
  Administered 2019-03-31: 80 mg via INTRAVENOUS

## 2019-03-31 MED ORDER — DEXAMETHASONE SODIUM PHOSPHATE 10 MG/ML IJ SOLN
INTRAMUSCULAR | Status: DC | PRN
  Administered 2019-03-31: 10 mg

## 2019-03-31 MED ORDER — SODIUM CHLORIDE 0.9 % IV SOLN
INTRAVENOUS | Status: DC | PRN
  Administered 2019-03-31 (×2): via INTRAVENOUS

## 2019-03-31 MED ORDER — ALLOPURINOL 100 MG PO TABS: 100.0000 mg | ORAL_TABLET | Freq: Every day | ORAL | Status: DC

## 2019-03-31 MED ORDER — ROCURONIUM BROMIDE 10 MG/ML (PF) SYRINGE
PREFILLED_SYRINGE | INTRAVENOUS | Status: DC | PRN
  Administered 2019-03-31: 20 mg via INTRAVENOUS
  Administered 2019-03-31: 70 mg via INTRAVENOUS

## 2019-03-31 MED ORDER — PREDNISOLONE ACETATE 1 % OP SUSP
1.0000 [drp] | Freq: Four times a day (QID) | OPHTHALMIC | Status: DC
  Filled 2019-03-31: qty 5

## 2019-03-31 SURGICAL SUPPLY — 80 items
APPLICATOR DR MATTHEWS STRL (MISCELLANEOUS) ×38 IMPLANT
BAND CIRCLING RETINAL 2.5 (Ophthalmic Related) IMPLANT
BLADE EYE CATARACT 19 1.4 BEAV (BLADE) IMPLANT
BLADE MVR KNIFE 19G (BLADE) IMPLANT
BNDG EYE OVAL (GAUZE/BANDAGES/DRESSINGS) IMPLANT
CANNULA ANT CHAM MAIN (OPHTHALMIC RELATED) IMPLANT
CANNULA DUAL BORE 23G (CANNULA) IMPLANT
CANNULA VLV SOFT TIP 25G (OPHTHALMIC) IMPLANT
CANNULA VLV SOFT TIP 25GA (OPHTHALMIC) IMPLANT
CORD BIPOLAR FORCEPS 12FT (ELECTRODE) IMPLANT
COTTONBALL LRG STERILE PKG (GAUZE/BANDAGES/DRESSINGS) ×12 IMPLANT
COVER SURGICAL LIGHT HANDLE (MISCELLANEOUS) ×4 IMPLANT
COVER WAND RF STERILE (DRAPES) ×4 IMPLANT
DRAPE OPHTHALMIC 77X100 STRL (CUSTOM PROCEDURE TRAY) ×4 IMPLANT
ERASER HMR WETFIELD 23G BP (MISCELLANEOUS) IMPLANT
FILTER BLUE MILLIPORE (MISCELLANEOUS) ×14 IMPLANT
FILTER STRAW FLUID ASPIR (MISCELLANEOUS) IMPLANT
FORCEPS GRIESHABER ILM 25G A (INSTRUMENTS) IMPLANT
GAS OPHTHALMIC (MISCELLANEOUS) IMPLANT
GLOVE SS BIOGEL STRL SZ 6.5 (GLOVE) ×2 IMPLANT
GLOVE SS BIOGEL STRL SZ 7 (GLOVE) ×2 IMPLANT
GLOVE SUPERSENSE BIOGEL SZ 6.5 (GLOVE) ×2
GLOVE SUPERSENSE BIOGEL SZ 7 (GLOVE) ×2
GLOVE SURG 8.5 LATEX PF (GLOVE) ×4 IMPLANT
GOWN STRL REUS W/ TWL LRG LVL3 (GOWN DISPOSABLE) ×4 IMPLANT
GOWN STRL REUS W/TWL LRG LVL3 (GOWN DISPOSABLE) ×4
HANDLE PNEUMATIC FOR CONSTEL (OPHTHALMIC) IMPLANT
IMPL SILICONE (Ophthalmic Related) ×2 IMPLANT
IMPLANT SILICONE (Ophthalmic Related) ×6 IMPLANT
KIT BASIN OR (CUSTOM PROCEDURE TRAY) ×4 IMPLANT
KIT PERFLUORON PROCEDURE 5ML (MISCELLANEOUS) IMPLANT
KIT TURNOVER KIT B (KITS) ×4 IMPLANT
KNIFE CRESCENT 1.75 EDGEAHEAD (BLADE) IMPLANT
KNIFE GRIESHABER SHARP 2.5MM (MISCELLANEOUS) ×15 IMPLANT
MICROPICK 25G (MISCELLANEOUS)
NDL 18GX1X1/2 (RX/OR ONLY) (NEEDLE) ×1 IMPLANT
NDL 25GX 5/8IN NON SAFETY (NEEDLE) IMPLANT
NDL FILTER BLUNT 18X1 1/2 (NEEDLE) ×1 IMPLANT
NDL HYPO 30X.5 LL (NEEDLE) ×2 IMPLANT
NDL PRECISIONGLIDE 27X1.5 (NEEDLE) IMPLANT
NEEDLE 18GX1X1/2 (RX/OR ONLY) (NEEDLE) ×4 IMPLANT
NEEDLE 25GX 5/8IN NON SAFETY (NEEDLE) IMPLANT
NEEDLE FILTER BLUNT 18X 1/2SAF (NEEDLE) ×2
NEEDLE FILTER BLUNT 18X1 1/2 (NEEDLE) ×2 IMPLANT
NEEDLE HYPO 30X.5 LL (NEEDLE) ×8 IMPLANT
NEEDLE PRECISIONGLIDE 27X1.5 (NEEDLE) IMPLANT
NS IRRIG 1000ML POUR BTL (IV SOLUTION) ×4 IMPLANT
PACK VITRECTOMY CUSTOM (CUSTOM PROCEDURE TRAY) ×4 IMPLANT
PAD ARMBOARD 7.5X6 YLW CONV (MISCELLANEOUS) ×8 IMPLANT
PAK PIK VITRECTOMY CVS 25GA (OPHTHALMIC) IMPLANT
PIC ILLUMINATED 25G (OPHTHALMIC)
PICK MICROPICK 25G (MISCELLANEOUS) IMPLANT
PIK ILLUMINATED 25G (OPHTHALMIC) IMPLANT
PROBE LASER ILLUM FLEX CVD 25G (OPHTHALMIC) IMPLANT
REPL STRA BRUSH NDL (NEEDLE) IMPLANT
REPL STRA BRUSH NEEDLE (NEEDLE) IMPLANT
RESERVOIR BACK FLUSH (MISCELLANEOUS) IMPLANT
ROLLS DENTAL (MISCELLANEOUS) ×8 IMPLANT
SET INJECTOR OIL FLUID CONSTEL (OPHTHALMIC) IMPLANT
SLEEVE SCLERAL TYPE 270 (Ophthalmic Related) ×3 IMPLANT
SPEAR EYE SURG WECK-CEL (MISCELLANEOUS) ×12 IMPLANT
SPONGE SURGIFOAM ABS GEL 12-7 (HEMOSTASIS) IMPLANT
STOPCOCK 4 WAY LG BORE MALE ST (IV SETS) IMPLANT
SUT CHROMIC 7 0 TG140 8 (SUTURE) ×4 IMPLANT
SUT ETHILON 9 0 TG140 8 (SUTURE) IMPLANT
SUT MERSILENE 4 0 RV 2 (SUTURE) ×8 IMPLANT
SUT SILK 2 0 (SUTURE) ×2
SUT SILK 2-0 18XBRD TIE 12 (SUTURE) ×2 IMPLANT
SUT SILK 4 0 RB 1 (SUTURE) ×4 IMPLANT
SUT VICRYL 7 0 TG140 8 (SUTURE) IMPLANT
SYR 10ML LL (SYRINGE) ×10 IMPLANT
SYR 20ML LL LF (SYRINGE) ×4 IMPLANT
SYR 5ML LL (SYRINGE) ×4 IMPLANT
SYR BULB 3OZ (MISCELLANEOUS) ×4 IMPLANT
SYR TB 1ML LUER SLIP (SYRINGE) IMPLANT
TIRE RTNL 2.5XGRV CNCV 9X (Ophthalmic Related) ×1 IMPLANT
TOWEL GREEN STERILE FF (TOWEL DISPOSABLE) ×12 IMPLANT
TUBING HIGH PRESS EXTEN 6IN (TUBING) IMPLANT
WATER STERILE IRR 1000ML POUR (IV SOLUTION) ×4 IMPLANT
WIPE INSTRUMENT VISIWIPE 73X73 (MISCELLANEOUS) ×4 IMPLANT

## 2019-03-31 NOTE — Progress Notes (Signed)
Patient arrived via stretcher.Transferred to bed. VSS. Eye gauze saturated, MD aware. Eye shield is intact. Will release orders and continue to monitor.

## 2019-03-31 NOTE — Brief Op Note (Signed)
Brief Operative note   Preoperative diagnosis:  retinal detachment right eye Postoperative diagnosis  * No Diagnosis Codes entered *  Procedures: Scleral buckle, laser, gas injection right eye  Surgeon:  Hayden Pedro, MD...  Assistant:  Deatra Ina SA    Anesthesia: General  Specimen: none  Estimated blood loss:  1cc  Complications: none  Patient sent to PACU in good condition  Composed by Hayden Pedro MD  Dictation number: [id number]

## 2019-03-31 NOTE — H&P (Signed)
I examined the patient today and there is no change in the medical status 

## 2019-03-31 NOTE — Anesthesia Procedure Notes (Signed)
Procedure Name: Intubation Date/Time: 03/31/2019 1:54 PM Performed by: Colin Benton, CRNA Pre-anesthesia Checklist: Patient identified, Emergency Drugs available, Suction available and Patient being monitored Patient Re-evaluated:Patient Re-evaluated prior to induction Oxygen Delivery Method: Circle system utilized Preoxygenation: Pre-oxygenation with 100% oxygen Induction Type: IV induction and Rapid sequence Ventilation: Mask ventilation without difficulty Laryngoscope Size: Miller and 2 Grade View: Grade II Tube type: Oral Tube size: 7.5 mm Number of attempts: 1 Airway Equipment and Method: Stylet Placement Confirmation: ETT inserted through vocal cords under direct vision,  positive ETCO2 and breath sounds checked- equal and bilateral Secured at: 22 cm Tube secured with: Tape Dental Injury: Teeth and Oropharynx as per pre-operative assessment

## 2019-03-31 NOTE — Transfer of Care (Signed)
Immediate Anesthesia Transfer of Care Note  Patient: Taylor Elliott  Procedure(s) Performed: SCLERAL BUCKLE (Right Eye) Insertion Of Gas (Right Eye) Photocoagulation With Laser (Right Eye)  Patient Location: PACU  Anesthesia Type:General  Level of Consciousness: drowsy and patient cooperative  Airway & Oxygen Therapy: Patient Spontanous Breathing  Post-op Assessment: Report given to RN and Post -op Vital signs reviewed and stable  Post vital signs: Reviewed and stable  Last Vitals:  Vitals Value Taken Time  BP 141/70 03/31/19 1655  Temp    Pulse 87 03/31/19 1657  Resp 23 03/31/19 1657  SpO2 92 % 03/31/19 1657  Vitals shown include unvalidated device data.  Last Pain:  Vitals:   03/31/19 1006  TempSrc:   PainSc: 4       Patients Stated Pain Goal: 2 (95/07/22 5750)  Complications: No apparent anesthesia complications

## 2019-04-01 ENCOUNTER — Encounter (HOSPITAL_COMMUNITY): Payer: Self-pay | Admitting: Ophthalmology

## 2019-04-01 DIAGNOSIS — H33001 Unspecified retinal detachment with retinal break, right eye: Secondary | ICD-10-CM | POA: Diagnosis not present

## 2019-04-01 MED ORDER — HYDROCODONE-ACETAMINOPHEN 5-325 MG PO TABS: 1.0000 | ORAL_TABLET | ORAL | 0 refills | Status: DC | PRN

## 2019-04-01 MED ORDER — BACITRACIN-POLYMYXIN B 500-10000 UNIT/GM OP OINT: 1.0000 "application " | TOPICAL_OINTMENT | Freq: Three times a day (TID) | OPHTHALMIC | 0 refills | Status: DC

## 2019-04-01 MED ORDER — BRIMONIDINE TARTRATE 0.2 % OP SOLN: 1.0000 [drp] | Freq: Two times a day (BID) | OPHTHALMIC | 12 refills | Status: DC

## 2019-04-01 MED ORDER — PREDNISOLONE ACETATE 1 % OP SUSP: 1.0000 [drp] | Freq: Four times a day (QID) | OPHTHALMIC | 0 refills | Status: DC

## 2019-04-01 MED ORDER — GATIFLOXACIN 0.5 % OP SOLN: 1.0000 [drp] | Freq: Four times a day (QID) | OPHTHALMIC | Status: DC

## 2019-04-01 MED FILL — HYDROCODON-APAP 5-325: 5-325 | 3 days supply | Qty: 30 | Fill #0

## 2019-04-01 NOTE — Plan of Care (Signed)
  Problem: Education: Goal: Required Educational Video(s) Outcome: Adequate for Discharge   Problem: Clinical Measurements: Goal: Postoperative complications will be avoided or minimized Outcome: Adequate for Discharge   Problem: Skin Integrity: Goal: Demonstration of wound healing without infection will improve Outcome: Adequate for Discharge

## 2019-04-01 NOTE — Progress Notes (Signed)
Taylor Elliott to be D/C'd  per MD order. Discussed with the patient and all questions fully answered.  VSS, Skin clean, dry and intact without evidence of skin break down, no evidence of skin tears noted.  IV catheter discontinued intact. Site without signs and symptoms of complications. Dressing and pressure applied.  An After Visit Summary was printed and given to the patient. Patient received prescription.  D/c education completed with patient/family including follow up instructions, medication list, d/c activities limitations if indicated, with other d/c instructions as indicated by MD - patient able to verbalize understanding, all questions fully answered.   Patient instructed to return to ED, call 911, or call MD for any changes in condition.   Patient to be escorted via [place], and D/C home via private auto.

## 2019-04-01 NOTE — Anesthesia Postprocedure Evaluation (Signed)
Anesthesia Post Note  Patient: Taylor Elliott  Procedure(s) Performed: SCLERAL BUCKLE (Right Eye) Insertion Of Gas (Right Eye) Photocoagulation With Laser (Right Eye)     Patient location during evaluation: PACU Anesthesia Type: General Level of consciousness: awake and alert Pain management: pain level controlled Vital Signs Assessment: post-procedure vital signs reviewed and stable Respiratory status: spontaneous breathing, nonlabored ventilation, respiratory function stable and patient connected to nasal cannula oxygen Cardiovascular status: blood pressure returned to baseline and stable Postop Assessment: no apparent nausea or vomiting Anesthetic complications: no    Last Vitals:  Vitals:   03/31/19 2141 04/01/19 0307  BP: 118/70 (!) 142/76  Pulse: 81 80  Resp: 16 18  Temp: 36.4 C (!) 36.3 C  SpO2: 98% 99%    Last Pain:  Vitals:   04/01/19 0528  TempSrc:   PainSc: [facility]

## 2019-04-01 NOTE — Discharge Summary (Signed)
Discharge summary not needed on [facility] patients per medical records. 

## 2019-04-01 NOTE — Op Note (Signed)
NAME: Taylor Elliott, Taylor Elliott. MEDICAL RECORD [id number] ACCOUNT [id number] DATE OF BIRTH:1950/12/12 FACILITY: [facility] LOCATION: [place], [place]  OPERATIVE REPORT  DATE OF PROCEDURE:  03/31/2019  ADMISSION DIAGNOSIS:  Rhegmatogenous retinal detachment in the right eye.  PROCEDURES:  Scleral buckle, right eye.  Gas fluid exchange, right eye.  Retinal photocoagulation, right eye.  Gas injection, right eye.   SURGEON:  Tempie Hoist, MD  ASSISTANT:  Deatra Ina, SA  ANESTHESIA:  General.  DETAILS:  Usual prep and drape.  A 360-degree limbal peritomy.  Isolation of 4 rectus muscles on 2-0 silk.  Scleral dissection from 1 o'clock to 11 o'clock, to admit #279 intrascleral implant.  Diathermy placed in the bed.  A 279 implant with 2 mm  trimmed from the posterior edge was placed against the globe.  A 240 band with a 270 sleeve was placed around the globe with the 270 sleeve at 2:00.  Perforation site was chosen at 5:30 o'clock in the posterior aspect of the bed.  Cut down of the sclerae  and then diathermy of the choroid was performed.  A small incision into the subretinal space resulted in a large amount of clear, colorless, subretinal fluid coming forth.  C3F8 was measured to 50%.  1.2 mL was injected through the 12 o'clock pars plana  to reinflate the globe.  Eight 4-0 [place] sutures were placed in the scleral flaps, 2 per quadrant.  These sutures were drawn securely.  The buckle was adjusted and trimmed.  The band was adjusted and trimmed.  The sutures were knotted and the free  ends removed.  Indirect ophthalmoscopy showed the retina to be lying nicely and placed on the scleral buckle.  The indirect ophthalmoscope laser was moved into place.  857 burns were placed around the retinal periphery on the scleral buckle and around  the break.  The power was 1200 mW, 1000 microns each, and 0.1 seconds each.  Inspection showed the retina to be fully attached with good laser marks, 3  for 360 degrees.  The conjunctiva was reposited with 7-0 chromic suture.  Polymyxin and ceftazidime  were rinsed around the globe for antibiotic coverage.  Marcaine was injected around the globe for postop pain.  Atropine solution was applied.  Decadron 10 mg was injected into the lower subconjunctival space.  Closing pressure was 10 with a [place]  tonometer.  Polysporin ophthalmic ointment, a patch, and a shield were placed.  The patient was awakened and taken to recovery in satisfactory condition.  COMPLICATIONS:  None.  DURATION:  3 hours.  LN/NUANCE  D:03/31/2019 T:04/01/2019 JOB:[id number]

## 2019-04-01 NOTE — Progress Notes (Signed)
04/01/2019, 6:42 AM  Mental Status:  Awake, Alert, Oriented Eye lid ecchymosis and edema.   Anterior segment: Cornea  Clear    Anterior Chamber Clear    Lens:   Clear, IOL,  Intra Ocular Pressure 22 mmHg with Tonopen  Vitreous: Clear 60%gas bubble  Retina:  Attached Good laser reaction   Impression: Excellent result Retina attached   Final Diagnosis: Principal Problem:   Rhegmatogenous retinal detachment of right eye   Plan: start post operative eye drops. Add alphagan.  Discharge to home.  Give post operative instructions  Taylor Elliott 04/01/2019, 6:42 AM

## 2019-04-02 ENCOUNTER — Ambulatory Visit: Payer: 59

## 2019-04-03 ENCOUNTER — Other Ambulatory Visit: Payer: Self-pay

## 2019-04-03 ENCOUNTER — Encounter (INDEPENDENT_AMBULATORY_CARE_PROVIDER_SITE_OTHER): Payer: Self-pay | Admitting: Ophthalmology

## 2019-04-03 ENCOUNTER — Ambulatory Visit (INDEPENDENT_AMBULATORY_CARE_PROVIDER_SITE_OTHER): Payer: 59 | Admitting: Ophthalmology

## 2019-04-03 DIAGNOSIS — H3321 Serous retinal detachment, right eye: Secondary | ICD-10-CM

## 2019-04-03 DIAGNOSIS — Z961 Presence of intraocular lens: Secondary | ICD-10-CM
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
## 2019-04-06 ENCOUNTER — Encounter: Payer: Self-pay | Admitting: Radiation Oncology

## 2019-04-06 ENCOUNTER — Encounter: Payer: Self-pay | Admitting: Hematology & Oncology

## 2019-04-07 ENCOUNTER — Other Ambulatory Visit: Payer: Self-pay | Admitting: Hematology & Oncology

## 2019-04-07 ENCOUNTER — Encounter (INDEPENDENT_AMBULATORY_CARE_PROVIDER_SITE_OTHER): Payer: 59 | Admitting: Ophthalmology

## 2019-04-07 ENCOUNTER — Other Ambulatory Visit: Payer: Self-pay

## 2019-04-07 ENCOUNTER — Other Ambulatory Visit: Payer: Self-pay | Admitting: Family Medicine

## 2019-04-07 DIAGNOSIS — H338 Other retinal detachments: Secondary | ICD-10-CM

## 2019-04-07 DIAGNOSIS — I10 Essential (primary) hypertension: Secondary | ICD-10-CM

## 2019-04-07 DIAGNOSIS — E785 Hyperlipidemia, unspecified: Secondary | ICD-10-CM

## 2019-04-07 MED FILL — TEMAZEPAM 30 MG CAPSULE: 30 | 30 days supply | Qty: 30 | Fill #0

## 2019-04-07 MED FILL — PREDNISOLONE AC 1% EYE DROP: 1 | 75 days supply | Qty: 15 | Fill #0

## 2019-04-07 MED FILL — BRIMONIDINE TARTRATE 0.15%: 0.15 | 90 days supply | Qty: 10 | Fill #0

## 2019-04-07 MED FILL — predniSONE 20 MG TABS: 20 | 30 days supply | Qty: 30 | Fill #0

## 2019-04-08 DIAGNOSIS — T63461D Toxic effect of venom of wasps, accidental (unintentional), subsequent encounter: Secondary | ICD-10-CM | POA: Diagnosis not present

## 2019-04-08 DIAGNOSIS — T63451D Toxic effect of venom of hornets, accidental (unintentional), subsequent encounter: Secondary | ICD-10-CM | POA: Diagnosis not present

## 2019-04-08 MED FILL — LISINOPRIL 20 MG TABLET: 20 | 90 days supply | Qty: 90 | Fill #0

## 2019-04-08 MED FILL — AMLODIPINE BESYLATE 10 MG T: 10 | 90 days supply | Qty: 90 | Fill #0

## 2019-04-08 MED FILL — FENOFIBRATE 160 MG TABLET: 160 | 90 days supply | Qty: 90 | Fill #0

## 2019-04-15 ENCOUNTER — Inpatient Hospital Stay: Payer: 59 | Attending: Hematology & Oncology | Admitting: Hematology & Oncology

## 2019-04-15 ENCOUNTER — Other Ambulatory Visit: Payer: Self-pay

## 2019-04-15 ENCOUNTER — Inpatient Hospital Stay: Payer: 59

## 2019-04-15 ENCOUNTER — Encounter: Payer: Self-pay | Admitting: Hematology & Oncology

## 2019-04-15 ENCOUNTER — Telehealth: Payer: Self-pay | Admitting: Hematology & Oncology

## 2019-04-15 ENCOUNTER — Ambulatory Visit: Payer: 59 | Admitting: Family Medicine

## 2019-04-15 VITALS — BP 140/70 | HR 82 | Temp 97.3°F | Resp 18 | Wt 196.0 lb

## 2019-04-15 DIAGNOSIS — Z8546 Personal history of malignant neoplasm of prostate: Secondary | ICD-10-CM | POA: Diagnosis not present

## 2019-04-15 DIAGNOSIS — Z5112 Encounter for antineoplastic immunotherapy: Secondary | ICD-10-CM | POA: Diagnosis not present

## 2019-04-15 DIAGNOSIS — R059 Cough, unspecified: Secondary | ICD-10-CM

## 2019-04-15 DIAGNOSIS — Z7982 Long term (current) use of aspirin: Secondary | ICD-10-CM | POA: Insufficient documentation

## 2019-04-15 DIAGNOSIS — C3491 Malignant neoplasm of unspecified part of right bronchus or lung: Secondary | ICD-10-CM | POA: Diagnosis not present

## 2019-04-15 DIAGNOSIS — Z79899 Other long term (current) drug therapy: Secondary | ICD-10-CM | POA: Insufficient documentation

## 2019-04-15 DIAGNOSIS — R05 Cough: Secondary | ICD-10-CM | POA: Diagnosis not present

## 2019-04-15 DIAGNOSIS — Z85528 Personal history of other malignant neoplasm of kidney: Secondary | ICD-10-CM | POA: Diagnosis not present

## 2019-04-15 DIAGNOSIS — Z923 Personal history of irradiation: Secondary | ICD-10-CM | POA: Insufficient documentation

## 2019-04-15 LAB — CBC WITH DIFFERENTIAL (CANCER CENTER ONLY)
Abs Immature Granulocytes: 0.04 10*3/uL (ref 0.00–0.07)
Basophils Absolute: 0 10*3/uL (ref 0.0–0.1)
Basophils Relative: 1 %
Eosinophils Absolute: 0.1 10*3/uL (ref 0.0–0.5)
Eosinophils Relative: 1 %
HCT: 38.6 % — ABNORMAL LOW (ref 39.0–52.0)
Hemoglobin: 12.8 g/dL — ABNORMAL LOW (ref 13.0–17.0)
Immature Granulocytes: 1 %
Lymphocytes Relative: 11 %
Lymphs Abs: 0.8 10*3/uL (ref 0.7–4.0)
MCH: 30.2 pg (ref 26.0–34.0)
MCHC: 33.2 g/dL (ref 30.0–36.0)
MCV: 91 fL (ref 80.0–100.0)
Monocytes Absolute: 0.6 10*3/uL (ref 0.1–1.0)
Monocytes Relative: 8 %
Neutro Abs: 6.1 10*3/uL (ref 1.7–7.7)
Neutrophils Relative %: 78 %
Platelet Count: 221 10*3/uL (ref 150–400)
RBC: 4.24 MIL/uL (ref 4.22–5.81)
RDW: 13.1 % (ref 11.5–15.5)
WBC Count: 7.7 10*3/uL (ref 4.0–10.5)
nRBC: 0 % (ref 0.0–0.2)

## 2019-04-15 LAB — TSH: TSH: 1.191 u[IU]/mL (ref 0.320–4.118)

## 2019-04-15 LAB — CMP (CANCER CENTER ONLY)
ALT: 21 U/L (ref 0–44)
AST: 11 U/L — ABNORMAL LOW (ref 15–41)
Albumin: 3.8 g/dL (ref 3.5–5.0)
Alkaline Phosphatase: 53 U/L (ref 38–126)
Anion gap: 7 (ref 5–15)
BUN: 28 mg/dL — ABNORMAL HIGH (ref 8–23)
CO2: 28 mmol/L (ref 22–32)
Calcium: 10 mg/dL (ref 8.9–10.3)
Chloride: 103 mmol/L (ref 98–111)
Creatinine: 1.26 mg/dL — ABNORMAL HIGH (ref 0.61–1.24)
GFR, Est AFR Am: >60 mL/min (ref >60)
GFR, Estimated: 59 mL/min — ABNORMAL LOW (ref >60)
Glucose, Bld: 107 mg/dL — ABNORMAL HIGH (ref 70–99)
Potassium: 4.2 mmol/L (ref 3.5–5.1)
Sodium: 138 mmol/L (ref 135–145)
Total Bilirubin: 0.3 mg/dL (ref 0.3–1.2)
Total Protein: 6 g/dL — ABNORMAL LOW (ref 6.5–8.1)

## 2019-04-15 LAB — LACTATE DEHYDROGENASE: LDH: 126 U/L (ref 98–192)

## 2019-04-15 MED ORDER — SODIUM CHLORIDE 0.9% FLUSH
10.0000 mL | INTRAVENOUS | Status: DC | PRN
  Administered 2019-04-15: 10 mL
  Filled 2019-04-15: qty 10

## 2019-04-15 MED ORDER — SODIUM CHLORIDE 0.9 % IV SOLN
1200.0000 mg | Freq: Once | INTRAVENOUS | Status: AC
  Administered 2019-04-15: 1200 mg via INTRAVENOUS
  Filled 2019-04-15: qty 20

## 2019-04-15 MED ORDER — HEPARIN SOD (PORK) LOCK FLUSH 100 UNIT/ML IV SOLN
500.0000 [IU] | Freq: Once | INTRAVENOUS | Status: DC
  Filled 2019-04-15: qty 5

## 2019-04-15 MED ORDER — SODIUM CHLORIDE 0.9 % IV SOLN
Freq: Once | INTRAVENOUS | Status: AC
  Administered 2019-04-15: 10:00:00 via INTRAVENOUS
  Filled 2019-04-15: qty 250

## 2019-04-15 MED ORDER — HYDROCODONE-HOMATROPINE 5-1.5 MG/5ML PO SYRP: 5.0000 mL | ORAL_SOLUTION | ORAL | 0 refills | Status: DC | PRN

## 2019-04-15 MED ORDER — HEPARIN SOD (PORK) LOCK FLUSH 100 UNIT/ML IV SOLN
500.0000 [IU] | Freq: Once | INTRAVENOUS | Status: AC | PRN
  Administered 2019-04-15: 500 [IU]
  Filled 2019-04-15: qty 5

## 2019-04-15 MED ORDER — SODIUM CHLORIDE 0.9% FLUSH
10.0000 mL | Freq: Once | INTRAVENOUS | Status: AC
  Administered 2019-04-15: 10 mL
  Filled 2019-04-15: qty 10

## 2019-04-15 MED FILL — HYDROCODONE-HOMATROPINE SOL: 5-1.5 | 9 days supply | Qty: 250 | Fill #0

## 2019-04-15 NOTE — Addendum Note (Signed)
Addended by: Volanda Napoleon on: 04/15/2019 09:35 AM   Modules accepted: Orders

## 2019-04-15 NOTE — Patient Instructions (Signed)
Implanted [facility] Guide An implanted port is a device that is placed under the skin. It is usually placed in the chest. The device can be used to give IV medicine, to take blood, or for dialysis. You may have an implanted port if:  You need IV medicine that would be irritating to the small veins in your hands or arms.  You need IV medicines, such as antibiotics, for a long period of time.  You need IV nutrition for a long period of time.  You need dialysis. Having a port means that your health care provider will not need to use the veins in your arms for these procedures. You may have fewer limitations when using a port than you would if you used other types of long-term IVs, and you will likely be able to return to normal activities after your incision heals. An implanted port has two main parts:  Reservoir. The reservoir is the part where a needle is inserted to give medicines or draw blood. The reservoir is round. After it is placed, it appears as a small, raised area under your skin.  Catheter. The catheter is a thin, flexible tube that connects the reservoir to a vein. Medicine that is inserted into the reservoir goes into the catheter and then into the vein. How is my port accessed? To access your port:  A numbing cream may be placed on the skin over the port site.  Your health care provider will put on a mask and sterile gloves.  The skin over your port will be cleaned carefully with a germ-killing soap and allowed to dry.  Your health care provider will gently pinch the port and insert a needle into it.  Your health care provider will check for a blood return to make sure the port is in the vein and is not clogged.  If your port needs to remain accessed to get medicine continuously (constant infusion), your health care provider will place a clear bandage (dressing) over the needle site. The dressing and needle will need to be changed every week, or as told by your health care  provider. What is flushing? Flushing helps keep the port from getting clogged. Follow instructions from your health care provider about how and when to flush the port. Ports are usually flushed with saline solution or a medicine called heparin. The need for flushing will depend on how the port is used:  If the port is only used from time to time to give medicines or draw blood, the port may need to be flushed: ? Before and after medicines have been given. ? Before and after blood has been drawn. ? As part of routine maintenance. Flushing may be recommended every 4-6 weeks.  If a constant infusion is running, the port may not need to be flushed.  Throw away any syringes in a disposal container that is meant for sharp items (sharps container). You can buy a sharps container from a pharmacy, or you can make one by using an empty hard plastic bottle with a cover. How long will my port stay implanted? The port can stay in for as long as your health care provider thinks it is needed. When it is time for the port to come out, a surgery will be done to remove it. The surgery will be similar to the procedure that was done to put the port in. Follow these instructions at home:   Flush your port as told by your health care provider.  If you need an infusion over several days, follow instructions from your health care provider about how to take care of your port site. Make sure you: ? Wash your hands with soap and water before you change your dressing. If soap and water are not available, use alcohol-based hand sanitizer. ? Change your dressing as told by your health care provider. ? Place any used dressings or infusion bags into a plastic bag. Throw that bag in the trash. ? Keep the dressing that covers the needle clean and dry. Do not get it wet. ? Do not use scissors or sharp objects near the tube. ? Keep the tube clamped, unless it is being used.  Check your port site every day for signs of  infection. Check for: ? Redness, swelling, or pain. ? Fluid or blood. ? Pus or a bad smell.  Protect the skin around the port site. ? Avoid wearing bra straps that rub or irritate the site. ? Protect the skin around your port from seat belts. Place a soft pad over your chest if needed.  Bathe or shower as told by your health care provider. The site may get wet as long as you are not actively receiving an infusion.  Return to your normal activities as told by your health care provider. Ask your health care provider what activities are safe for you.  Carry a medical alert card or wear a medical alert bracelet at all times. This will let health care providers know that you have an implanted port in case of an emergency. Get help right away if:  You have redness, swelling, or pain at the port site.  You have fluid or blood coming from your port site.  You have pus or a bad smell coming from the port site.  You have a fever. Summary  Implanted ports are usually placed in the chest for long-term IV access.  Follow instructions from your health care provider about flushing the port and changing bandages (dressings).  Take care of the area around your port by avoiding clothing that puts pressure on the area, and by watching for signs of infection.  Protect the skin around your port from seat belts. Place a soft pad over your chest if needed.  Get help right away if you have a fever or you have redness, swelling, pain, drainage, or a bad smell at the port site. This information is not intended to replace advice given to you by your health care provider. Make sure you discuss any questions you have with your health care provider. Document Released: 07/16/2005 Document Revised: 11/07/2018 Document Reviewed: 08/18/2016 Elsevier Patient Education  2020 [employer].

## 2019-04-15 NOTE — Progress Notes (Signed)
Hematology and Oncology Follow Up Visit  Taylor Elliott [id number] 31-Mar-1951 68 y.o. 04/15/2019   Principle Diagnosis:   Extensive stage small cell lung cancer  SIADH secondary to small cell lung cancer  Current Therapy:    Carboplatinum/etoposide/Tecentriq-cycle #6  Atezolizumab 1200 mg IV q 3 week -- maintenance -  Start 01/21/2019  XRT for CNS mets -- completed on 03/27/2019     Interim History:  [name] is back for follow-up.  He completed his radiation therapy about 3 weeks ago.  He has some radiation dermatitis on his scalp.  There is no obvious exudate or bleeding.  He still has a cough.  The big issue though is that he had a detached retina with his right eye.  This was a few weeks ago.  He required surgery for this.  I think this was right before Labor Day weekend.  He goes back to see the ophthalmologist in a week.  He has had no problems with bowels or bladder.  He has had no rashes outside of that with the radiation on his scalp.  He is getting around with the brace on his left leg.  Overall, his performance status is ECOG 1.   Medications:  Current Outpatient Medications:  .  allopurinol (ZYLOPRIM) 100 MG tablet, TAKE 1 TABLET BY MOUTH 2 TIMES DAILY. (Patient taking differently: Take 100 mg by mouth daily. ), Disp: 180 tablet, Rfl: 3 .  amLODipine (NORVASC) 10 MG tablet, TAKE 1 TABLET BY MOUTH EVERY MORNING, Disp: 90 tablet, Rfl: 1 .  aspirin EC 81 MG tablet, Take 81 mg by mouth daily., Disp: , Rfl:  .  bacitracin-polymyxin b (POLYSPORIN) ophthalmic ointment, Place 1 application into the right eye 3 (three) times daily. apply to eye every 12 hours while awake, Disp: 3.5 g, Rfl: 0 .  brimonidine (ALPHAGAN) 0.15 % ophthalmic solution, , Disp: , Rfl:  .  brimonidine (ALPHAGAN) 0.2 % ophthalmic solution, Place 1 drop into the right eye 2 (two) times daily., Disp: 5 mL, Rfl: 12 .  calcium carbonate (TUMS - DOSED IN MG ELEMENTAL CALCIUM) 500 MG chewable tablet,  Chew 2 tablets by mouth daily as needed for indigestion or heartburn. , Disp: , Rfl:  .  EPINEPHrine 0.3 mg/0.3 mL IJ SOAJ injection, Inject 0.3 mg into the muscle once., Disp: , Rfl:  .  fenofibrate 160 MG tablet, TAKE 1 TABLET (160 MG TOTAL) BY MOUTH DAILY., Disp: 90 tablet, Rfl: 1 .  gatifloxacin (ZYMAXID) 0.5 % SOLN, Place 1 drop into the right eye 4 (four) times daily., Disp:  , Rfl:  .  HYDROcodone-acetaminophen (NORCO/VICODIN) 5-325 MG tablet, Take 1 tablet by mouth every 6 (six) hours as needed for moderate pain., Disp: 60 tablet, Rfl: 0 .  HYDROcodone-homatropine (HYCODAN) 5-1.5 MG/5ML syrup, Take 5 mLs by mouth every 4 (four) hours as needed for cough., Disp: 250 mL, Rfl: 0 .  lactulose (CHRONULAC) 10 GM/15ML solution, Take 15 mLs (10 g total) by mouth 2 (two) times daily as needed for mild constipation., Disp: 473 mL, Rfl: 1 .  lidocaine-prilocaine (EMLA) cream, Apply 1 application topically as needed (port access). , Disp: , Rfl:  .  lisinopril (ZESTRIL) 20 MG tablet, TAKE 1 TABLET BY MOUTH ONCE DAILY, Disp: 90 tablet, Rfl: 1 .  Multiple Vitamin (MULTIVITAMIN) tablet, Take 1 tablet by mouth daily., Disp: , Rfl:  .  prednisoLONE acetate (PRED FORTE) 1 % ophthalmic suspension, Place 1 drop into the right eye 4 (four) times daily., Disp:  5 mL, Rfl: 0 .  predniSONE (DELTASONE) 20 MG tablet, Take 20 mg by mouth daily., Disp: , Rfl:  .  pyridoxine (B-6) 200 MG tablet, Take 200 mg by mouth daily., Disp: , Rfl:  .  rosuvastatin (CRESTOR) 10 MG tablet, TAKE 1 TABLET BY MOUTH ONCE DAILY (Patient not taking: Reported on 03/25/2019), Disp: 90 tablet, Rfl: 1 .  solifenacin (VESICARE) 10 MG tablet, Take 10 mg by mouth every evening., Disp: , Rfl:  .  temazepam (RESTORIL) 30 MG capsule, TAKE 1 CAPSULE (30 MG TOTAL) BY MOUTH AT BEDTIME AS NEEDED FOR SLEEP., Disp: 30 capsule, Rfl: 0 .  UNABLE TO FIND, Med Name: Allergy shots., Disp: , Rfl:   Allergies:  Allergies  Allergen Reactions  . Bee Venom  Anaphylaxis  . Clindamycin/Lincomycin Hives    Past Medical History, Surgical history, Social history, and Family History were reviewed and updated.  Review of Systems: Review of Systems  Constitutional: Negative.   HENT:  Negative.   Eyes: Negative.   Respiratory: Positive for cough.   Cardiovascular: Negative.   Gastrointestinal: Negative.   Endocrine: Negative.   Genitourinary: Negative.    Musculoskeletal: Positive for back pain, myalgias and neck pain.  Skin: Positive for itching and rash.  Neurological: Negative.   Hematological: Negative.   Psychiatric/Behavioral: Negative.     Physical Exam:  weight is 196 lb (88.9 kg). His temporal temperature is 97.3 F (36.3 C) (abnormal). His blood pressure is 140/70 and his pulse is 82. His respiration is 18.   Wt Readings from Last 3 Encounters:  04/15/19 196 lb (88.9 kg)  03/31/19 207 lb (93.9 kg)  03/25/19 207 lb (93.9 kg)    Physical Exam Vitals signs reviewed.  HENT:     Head: Normocephalic and atraumatic.  Eyes:     Pupils: Pupils are equal, round, and reactive to light.  Neck:     Musculoskeletal: Normal range of motion.  Cardiovascular:     Rate and Rhythm: Normal rate and regular rhythm.     Heart sounds: Normal heart sounds.  Pulmonary:     Effort: Pulmonary effort is normal.     Breath sounds: Normal breath sounds.  Abdominal:     General: Bowel sounds are normal.     Palpations: Abdomen is soft.     Comments: Abdominal exam shows a slightly obese abdomen.  He does have an umbilical hernia.  I really cannot palpate his liver at this point.  There is no fluid wave.  There is no inguinal adenopathy.  There is no splenomegaly.    Musculoskeletal: Normal range of motion.        General: No tenderness or deformity.  Lymphadenopathy:     Cervical: No cervical adenopathy.  Skin:    General: Skin is warm and dry.     Findings: No erythema or rash.  Neurological:     Mental Status: He is alert and oriented to  person, place, and time.  Psychiatric:        Behavior: Behavior normal.        Thought Content: Thought content normal.        Judgment: Judgment normal.      Lab Results  Component Value Date   WBC 7.7 04/15/2019   HGB 12.8 (L) 04/15/2019   HCT 38.6 (L) 04/15/2019   MCV 91.0 04/15/2019   PLT 221 04/15/2019     Chemistry      Component Value Date/Time   NA 138 04/15/2019 0830  NA 135 (L) 07/12/2015 0813   K 4.2 04/15/2019 0830   K 4.5 07/12/2015 0813   CL 103 04/15/2019 0830   CO2 28 04/15/2019 0830   CO2 24 07/12/2015 0813   BUN 28 (H) 04/15/2019 0830   BUN 15.7 07/12/2015 0813   CREATININE 1.26 (H) 04/15/2019 0830   CREATININE 1.6 (H) 07/12/2015 0813      Component Value Date/Time   CALCIUM 10.0 04/15/2019 0830   CALCIUM 10.1 07/12/2015 0813   ALKPHOS 53 04/15/2019 0830   ALKPHOS 115 07/12/2015 0813   AST 11 (L) 04/15/2019 0830   AST 22 07/12/2015 0813   ALT 21 04/15/2019 0830   ALT 24 07/12/2015 0813   BILITOT 0.3 04/15/2019 0830   BILITOT 0.46 07/12/2015 0813       Impression and Plan: Mr. Champney is a 68 year old white male.  He has a past history of renal cell carcinoma and also prostate cancer.  Now, he has a third malignancy.  This is metastatic small cell lung cancer.  We will go ahead with his fifth cycle of Tecentriq as a maintenance.  I will plan for a PET scan before I see him back in early October.  He will not need a MRI of the brain probably until early November.  A lot is going on with Mr. Shells.  I spent about 1/2-hour with him.  His wife was on the cell phone.  I had to arrange for his PET scan.  We had to make some adjustments with his chemotherapy protocol.      Volanda Napoleon, MD 9/16/20209:12 AM

## 2019-04-15 NOTE — Progress Notes (Signed)
Patient has current prednisone prescription from an opthamologist. It is necessary to continue prednisone, Dr. Marin Olp is aware. Okay to proceed with Tecentriq.

## 2019-04-15 NOTE — Patient Instructions (Signed)
[facility] Discharge Instructions for Patients Receiving Chemotherapy  Today you received the following chemotherapy agents Tecentriq To help prevent nausea and vomiting after your treatment, we encourage you to take your nausea medication as prescribed.   If you develop nausea and vomiting that is not controlled by your nausea medication, call the clinic.   BELOW ARE SYMPTOMS THAT SHOULD BE REPORTED IMMEDIATELY:  *FEVER GREATER THAN 100.5 F  *CHILLS WITH OR WITHOUT FEVER  NAUSEA AND VOMITING THAT IS NOT CONTROLLED WITH YOUR NAUSEA MEDICATION  *UNUSUAL SHORTNESS OF BREATH  *UNUSUAL BRUISING OR BLEEDING  TENDERNESS IN MOUTH AND THROAT WITH OR WITHOUT PRESENCE OF ULCERS  *URINARY PROBLEMS  *BOWEL PROBLEMS  UNUSUAL RASH Items with * indicate a potential emergency and should be followed up as soon as possible.  Feel free to call the clinic should you have any questions or concerns. The clinic phone number is [phone].  Please show the [facility] at check-in to the Emergency Department and triage nurse.

## 2019-04-15 NOTE — Telephone Encounter (Signed)
PET Scan appointment scheduled and patient was given updated calendar w/ date/time & instructions

## 2019-04-16 ENCOUNTER — Ambulatory Visit: Payer: 59 | Admitting: Orthopedic Surgery

## 2019-04-21 ENCOUNTER — Other Ambulatory Visit: Payer: Self-pay

## 2019-04-21 ENCOUNTER — Encounter (INDEPENDENT_AMBULATORY_CARE_PROVIDER_SITE_OTHER): Payer: 59 | Admitting: Ophthalmology

## 2019-04-21 DIAGNOSIS — H338 Other retinal detachments: Secondary | ICD-10-CM

## 2019-04-27 ENCOUNTER — Ambulatory Visit
Admission: RE | Admit: 2019-04-27 | Discharge: 2019-04-27 | Disposition: A | Discharge status: Home / Self Care | Payer: 59 | Source: Ambulatory Visit | Attending: Radiation Oncology | Admitting: Radiation Oncology

## 2019-04-27 ENCOUNTER — Encounter: Payer: Self-pay | Admitting: Radiation Oncology

## 2019-04-27 ENCOUNTER — Other Ambulatory Visit: Payer: Self-pay

## 2019-04-27 VITALS — BP 140/70 | HR 80 | Temp 98.5°F | Resp 18 | Ht 68.0 in | Wt 201.4 lb

## 2019-04-27 DIAGNOSIS — F329 Major depressive disorder, single episode, unspecified: Secondary | ICD-10-CM | POA: Diagnosis not present

## 2019-04-27 DIAGNOSIS — C3491 Malignant neoplasm of unspecified part of right bronchus or lung: Secondary | ICD-10-CM | POA: Diagnosis not present

## 2019-04-27 DIAGNOSIS — Z79899 Other long term (current) drug therapy: Secondary | ICD-10-CM | POA: Diagnosis not present

## 2019-04-27 DIAGNOSIS — Z7982 Long term (current) use of aspirin: Secondary | ICD-10-CM | POA: Insufficient documentation

## 2019-04-27 DIAGNOSIS — R634 Abnormal weight loss: Secondary | ICD-10-CM | POA: Diagnosis not present

## 2019-04-27 DIAGNOSIS — Z923 Personal history of irradiation: Secondary | ICD-10-CM | POA: Diagnosis not present

## 2019-04-27 NOTE — Progress Notes (Incomplete)
°  Patient Name: Taylor Elliott MRN: [id number] DOB: [birth year] Referring Physician: Burney Gauze (Profile Not Attached) Date of Service: 03/27/2019 [facility], [place]                                                        End Of Treatment Note  Diagnoses: C34.01-Malignant neoplasm of right main bronchus C79.31-Secondary malignant neoplasm of brain  Cancer Staging: Extensive stage small cell lung cancer  Intent: Palliative  Radiation Treatment Dates: 03/10/2019 through 03/27/2019 Site Technique Total Dose Dose per Fx Completed Fx Beam Energies  Brain: Brain Complex 35/35 2.5 14/14 6X   Narrative: The patient tolerated radiation therapy relatively well. Aside from chronic pain to his back, he reported occasional headaches, some loss of appetite, and mild to moderate fatigue. He denied any vision or hearing changes.  Plan: The patient will follow-up with radiation oncology in 1 month.  ________________________________________________  Blair Promise, PhD, MD  This document serves as a record of services personally performed by Gery Pray, MD. It was created on his behalf by Wilburn Mylar, a trained medical scribe. The creation of this record is based on the scribe's personal observations and the provider's statements to them. This document has been checked and approved by the attending provider.

## 2019-04-27 NOTE — Patient Instructions (Signed)
Coronavirus (COVID-19) Are you at risk?  Are you at risk for the Coronavirus (COVID-19)?  To be considered HIGH RISK for Coronavirus (COVID-19), you have to meet the following criteria:  . Traveled to [place], [place], [place], [place] or [place]; or in the [place] to [place], [place], [place], or [place]; and have fever, cough, and shortness of breath within the last 2 weeks of travel OR . Been in close contact with a person diagnosed with COVID-19 within the last 2 weeks and have fever, cough, and shortness of breath . IF YOU DO NOT MEET THESE CRITERIA, YOU ARE CONSIDERED LOW RISK FOR COVID-19.  What to do if you are HIGH RISK for COVID-19?  . If you are having a medical emergency, call 911. . Seek medical care right away. Before you go to a doctor's office, urgent care or emergency department, call ahead and tell them about your recent travel, contact with someone diagnosed with COVID-19, and your symptoms. You should receive instructions from your physician's office regarding next steps of care.  . When you arrive at healthcare provider, tell the healthcare staff immediately you have returned from visiting [place], [place], [place], [place] or [place]; or traveled in the [place] to [place], [place], [place], or [place]; in the last two weeks or you have been in close contact with a person diagnosed with COVID-19 in the last 2 weeks.   . Tell the health care staff about your symptoms: fever, cough and shortness of breath. . After you have been seen by a medical provider, you will be either: o Tested for (COVID-19) and discharged home on quarantine except to seek medical care if symptoms worsen, and asked to  - Stay home and avoid contact with others until you get your results (4-5 days)  - Avoid travel on public transportation if possible (such as bus, train, or airplane) or o Sent to the Emergency Department by EMS for evaluation, COVID-19 testing, and possible  admission depending on your condition and test results.  What to do if you are LOW RISK for COVID-19?  Reduce your risk of any infection by using the same precautions used for avoiding the common cold or flu:  . Wash your hands often with soap and warm water for at least 20 seconds.  If soap and water are not readily available, use an alcohol-based hand sanitizer with at least 60% alcohol.  . If coughing or sneezing, cover your mouth and nose by coughing or sneezing into the elbow areas of your shirt or coat, into a tissue or into your sleeve (not your hands). . Avoid shaking hands with others and consider head nods or verbal greetings only. . Avoid touching your eyes, nose, or mouth with unwashed hands.  . Avoid close contact with people who are sick. . Avoid places or events with large numbers of people in one location, like concerts or sporting events. . Carefully consider travel plans you have or are making. . If you are planning any travel outside or inside the [place], visit the CDC's Travelers' Health webpage for the latest health notices. . If you have some symptoms but not all symptoms, continue to monitor at home and seek medical attention if your symptoms worsen. . If you are having a medical emergency, call 911.   ADDITIONAL HEALTHCARE OPTIONS FOR PATIENTS  [facility] / e-Visit: [email or web address]         [facility]: [phone]  [facility]: [phone]                   [facility]: [phone]   

## 2019-04-27 NOTE — Progress Notes (Addendum)
Radiation Oncology         [phone] ________________________________  Name: Taylor Elliott MRN: [id number]  Date: 04/27/2019  DOB: [birth year]  Follow-Up Visit Note  CC: Taylor Held, DO  Taylor Olp Rudell Cobb, MD    ICD-10-CM   1. Small cell lung cancer, right (HCC)  C34.91     Diagnosis:   Extensive stage small cell lung cancer  Interval Since Last Radiation:  1 month  03/10/2019 through 03/27/2019 Site Technique Total Dose Dose per Fx Completed Fx Beam Energies  Brain: Brain Complex 35/35  Gy 2.5 14/14 6X   Narrative:  The patient returns today for routine follow-up. Since his last visit, he underwent right eye surgery on 03/31/2019 under Dr. Zigmund Elliott for a detached retina. He last saw Dr. Marin Olp on 04/15/2019.  On review of systems, he reports frequent, dull headache, mainly in the right temple, rated 2/10. He also reports poor appetite. He notes his vision is improving since his surgery. He denies any auditory concerns, memory concerns, and fine motor skill issues.  He is depressed today given all the issues going on for him.  He has noticed some mild shortness of breath and is concerned about this issue and concerned that maybe his cancer has returned in the chest.  ALLERGIES:  is allergic to bee venom and clindamycin/lincomycin.  Meds: Current Outpatient Medications  Medication Sig Dispense Refill  . allopurinol (ZYLOPRIM) 100 MG tablet TAKE 1 TABLET BY MOUTH 2 TIMES DAILY. (Patient taking differently: Take 100 mg by mouth daily. ) 180 tablet 3  . amLODipine (NORVASC) 10 MG tablet TAKE 1 TABLET BY MOUTH EVERY MORNING 90 tablet 1  . aspirin EC 81 MG tablet Take 81 mg by mouth daily.    . bacitracin-polymyxin b (POLYSPORIN) ophthalmic ointment Place 1 application into the right eye 3 (three) times daily. apply to eye every 12 hours while awake 3.5 g 0  . brimonidine (ALPHAGAN) 0.15 % ophthalmic solution     . brimonidine (ALPHAGAN) 0.2 % ophthalmic solution Place 1 drop  into the right eye 2 (two) times daily. 5 mL 12  . calcium carbonate (TUMS - DOSED IN MG ELEMENTAL CALCIUM) 500 MG chewable tablet Chew 2 tablets by mouth daily as needed for indigestion or heartburn.     Marland Kitchen EPINEPHrine 0.3 mg/0.3 mL IJ SOAJ injection Inject 0.3 mg into the muscle once.    . fenofibrate 160 MG tablet TAKE 1 TABLET (160 MG TOTAL) BY MOUTH DAILY. 90 tablet 1  . gatifloxacin (ZYMAXID) 0.5 % SOLN Place 1 drop into the right eye 4 (four) times daily.    Marland Kitchen HYDROcodone-acetaminophen (NORCO/VICODIN) 5-325 MG tablet Take 1 tablet by mouth every 6 (six) hours as needed for moderate pain. 60 tablet 0  . HYDROcodone-homatropine (HYCODAN) 5-1.5 MG/5ML syrup Take 5 mLs by mouth every 4 (four) hours as needed for cough. 250 mL 0  . lactulose (CHRONULAC) 10 GM/15ML solution Take 15 mLs (10 g total) by mouth 2 (two) times daily as needed for mild constipation. 473 mL 1  . lidocaine-prilocaine (EMLA) cream Apply 1 application topically as needed (port access).     Marland Kitchen lisinopril (ZESTRIL) 20 MG tablet TAKE 1 TABLET BY MOUTH ONCE DAILY 90 tablet 1  . Multiple Vitamin (MULTIVITAMIN) tablet Take 1 tablet by mouth daily.    . prednisoLONE acetate (PRED FORTE) 1 % ophthalmic suspension Place 1 drop into the right eye 4 (four) times daily. 5 mL 0  . predniSONE (DELTASONE) 20  MG tablet Take 20 mg by mouth daily.    Marland Kitchen pyridoxine (B-6) 200 MG tablet Take 200 mg by mouth daily.    . solifenacin (VESICARE) 10 MG tablet Take 10 mg by mouth every evening.    . temazepam (RESTORIL) 30 MG capsule TAKE 1 CAPSULE (30 MG TOTAL) BY MOUTH AT BEDTIME AS NEEDED FOR SLEEP. 30 capsule 0  . UNABLE TO FIND Med Name: Allergy shots.    . rosuvastatin (CRESTOR) 10 MG tablet TAKE 1 TABLET BY MOUTH ONCE DAILY (Patient not taking: Reported on 03/25/2019) 90 tablet 1   No current facility-administered medications for this encounter.     Physical Findings: The patient is in no acute distress. Patient is alert and oriented.    height is 5\' 8"  (1.727 m) and weight is 201 lb 6 oz (91.3 kg). His temporal temperature is 98.5 F (36.9 C). His blood pressure is 140/70 and his pulse is 80. His respiration is 18 and oxygen saturation is 100%. .  Lungs are clear to auscultation bilaterally. Heart has regular rate and rhythm. No palpable cervical, supraclavicular, or axillary adenopathy. Abdomen soft, non-tender, normal bowel sounds. Scalp is healed well.  Examination of oral cavity reveals no secondary infection.  Lab Findings: Lab Results  Component Value Date   WBC 7.7 04/15/2019   HGB 12.8 (L) 04/15/2019   HCT 38.6 (L) 04/15/2019   MCV 91.0 04/15/2019   PLT 221 04/15/2019    Radiographic Findings: No results found.  Impression:  The patient is recovering from the effects of radiation.  Mild situational depression related to all the issues going on for him.  Patient is sleeping better after starting Restoril.  Plan: Brain MRI in 2 months and follow-up soon afterward.  Patient will also have a PET scan  in the near future ordered by Dr. Marin Olp.  ____________________________________ Taylor Pray, MD   This document serves as a record of services personally performed by Taylor Pray, MD. It was created on his behalf by Taylor Elliott, a trained medical scribe. The creation of this record is based on the scribe's personal observations and the provider's statements to them. This document has been checked and approved by the attending provider.

## 2019-04-27 NOTE — Progress Notes (Signed)
Pt presents today for f/u with Dr. Sondra Come. Pt reports frequent, dull headache, mainly in RIGHT temple area, rated 2/10. Pt reports recent retina detachment surgery by Dr. Zigmund Daniel. Pt reports vision is improving. Pt denies auditory concerns. Pt denies memory concerns. Pt reports fine motor skills WNL. Pt reports poor appetite. Pt is receiving immunotherapy per Dr. Marin Olp.   BP 140/70 (BP Location: Left Arm, Patient Position: Sitting)   Pulse 80   Temp 98.5 F (36.9 C) (Temporal)   Resp 18   Ht 5\' 8"  (1.727 m)   Wt 201 lb 6 oz (91.3 kg)   SpO2 100%   BMI 30.62 kg/m   Wt Readings from Last 3 Encounters:  04/27/19 201 lb 6 oz (91.3 kg)  04/15/19 196 lb (88.9 kg)  03/31/19 207 lb (93.9 kg)   Loma Sousa, RN BSN

## 2019-04-30 ENCOUNTER — Ambulatory Visit: Payer: 59 | Admitting: Orthopedic Surgery

## 2019-05-01 ENCOUNTER — Ambulatory Visit (HOSPITAL_COMMUNITY)
Admission: RE | Admit: 2019-05-01 | Discharge: 2019-05-01 | Disposition: A | Discharge status: Home / Self Care | Payer: 59 | Source: Ambulatory Visit | Attending: Hematology & Oncology | Admitting: Hematology & Oncology

## 2019-05-01 ENCOUNTER — Other Ambulatory Visit: Payer: Self-pay

## 2019-05-01 DIAGNOSIS — C3491 Malignant neoplasm of unspecified part of right bronchus or lung: Secondary | ICD-10-CM | POA: Insufficient documentation

## 2019-05-01 DIAGNOSIS — C787 Secondary malignant neoplasm of liver and intrahepatic bile duct: Secondary | ICD-10-CM | POA: Diagnosis not present

## 2019-05-01 DIAGNOSIS — C3490 Malignant neoplasm of unspecified part of unspecified bronchus or lung: Secondary | ICD-10-CM | POA: Diagnosis not present

## 2019-05-01 LAB — GLUCOSE, CAPILLARY: Glucose-Capillary: 115 mg/dL — ABNORMAL HIGH (ref 70–99)

## 2019-05-01 MED ORDER — FLUDEOXYGLUCOSE F - 18 (FDG) INJECTION
9.8500 | Freq: Once | INTRAVENOUS | Status: AC | PRN
  Administered 2019-05-01: 11:00:00 9.85 via INTRAVENOUS

## 2019-05-04 ENCOUNTER — Other Ambulatory Visit: Payer: Self-pay | Admitting: Hematology & Oncology

## 2019-05-04 NOTE — Progress Notes (Signed)
DISCONTINUE OFF PATHWAY REGIMEN - Small Cell Lung   [id number]:Atezolizumab 1,200 mg q21 Days:   A cycle is every 21 days:     Atezolizumab   **Always confirm dose/schedule in your pharmacy ordering system**  REASON: Disease Progression PRIOR TREATMENT: Off Pathway: Atezolizumab 1,200 mg q21 Days TREATMENT RESPONSE: Partial Response (PR)  START ON PATHWAY REGIMEN - Small Cell Lung     A cycle is every 21 days:     Lurbinectedin   **Always confirm dose/schedule in your pharmacy ordering system**  Patient Characteristics: Relapsed or Progressive Disease, Second Line, Relapse 3 - 6 Months Therapeutic Status: Relapsed or Progressive Disease Line of Therapy: Second Line Time to Relapse: Relapse 3 - 6 Months Intent of Therapy: Non-Curative / Palliative Intent, Discussed with Patient

## 2019-05-05 ENCOUNTER — Encounter: Payer: Self-pay | Admitting: *Deleted

## 2019-05-05 NOTE — Progress Notes (Signed)
Patient's most recent scans show progression. Dr Marin Olp would like to start him on new treatment at tomorrow's already scheduled appointment.  Pharmacy is unable to obtain the drug until Wednesday afternoon and insurance authorization is still pending. Dr Marin Olp notified that treatment will not be administered tomorrow. Called and notified patient that he would keep his regularly scheduled appointments with Dr Marin Olp tomorrow, but that the treatment would be delayed until at least Thursday, possibly Friday depending on authorization. He understood.

## 2019-05-06 ENCOUNTER — Inpatient Hospital Stay: Payer: 59 | Attending: Hematology & Oncology | Admitting: Hematology & Oncology

## 2019-05-06 ENCOUNTER — Inpatient Hospital Stay: Payer: 59

## 2019-05-06 ENCOUNTER — Other Ambulatory Visit: Payer: Self-pay | Admitting: *Deleted

## 2019-05-06 ENCOUNTER — Ambulatory Visit: Payer: 59

## 2019-05-06 ENCOUNTER — Encounter: Payer: Self-pay | Admitting: Hematology & Oncology

## 2019-05-06 ENCOUNTER — Encounter: Payer: Self-pay | Admitting: *Deleted

## 2019-05-06 ENCOUNTER — Other Ambulatory Visit: Payer: Self-pay

## 2019-05-06 ENCOUNTER — Other Ambulatory Visit: Payer: Self-pay | Admitting: Hematology & Oncology

## 2019-05-06 DIAGNOSIS — Z8546 Personal history of malignant neoplasm of prostate: Secondary | ICD-10-CM | POA: Insufficient documentation

## 2019-05-06 DIAGNOSIS — E860 Dehydration: Secondary | ICD-10-CM | POA: Diagnosis not present

## 2019-05-06 DIAGNOSIS — C3491 Malignant neoplasm of unspecified part of right bronchus or lung: Secondary | ICD-10-CM | POA: Diagnosis not present

## 2019-05-06 DIAGNOSIS — E222 Syndrome of inappropriate secretion of antidiuretic hormone: Secondary | ICD-10-CM | POA: Diagnosis not present

## 2019-05-06 DIAGNOSIS — Z23 Encounter for immunization: Secondary | ICD-10-CM | POA: Insufficient documentation

## 2019-05-06 DIAGNOSIS — R05 Cough: Secondary | ICD-10-CM | POA: Insufficient documentation

## 2019-05-06 DIAGNOSIS — Z79899 Other long term (current) drug therapy: Secondary | ICD-10-CM | POA: Insufficient documentation

## 2019-05-06 DIAGNOSIS — Z5111 Encounter for antineoplastic chemotherapy: Secondary | ICD-10-CM | POA: Insufficient documentation

## 2019-05-06 DIAGNOSIS — E871 Hypo-osmolality and hyponatremia: Secondary | ICD-10-CM | POA: Diagnosis not present

## 2019-05-06 DIAGNOSIS — Z85528 Personal history of other malignant neoplasm of kidney: Secondary | ICD-10-CM | POA: Insufficient documentation

## 2019-05-06 DIAGNOSIS — Z7982 Long term (current) use of aspirin: Secondary | ICD-10-CM | POA: Diagnosis not present

## 2019-05-06 DIAGNOSIS — R059 Cough, unspecified: Secondary | ICD-10-CM

## 2019-05-06 DIAGNOSIS — F17210 Nicotine dependence, cigarettes, uncomplicated: Secondary | ICD-10-CM | POA: Diagnosis not present

## 2019-05-06 LAB — CMP (CANCER CENTER ONLY)
ALT: 26 U/L (ref 0–44)
AST: 24 U/L (ref 15–41)
Albumin: 4.2 g/dL (ref 3.5–5.0)
Alkaline Phosphatase: 64 U/L (ref 38–126)
Anion gap: 7 (ref 5–15)
BUN: 14 mg/dL (ref 8–23)
CO2: 28 mmol/L (ref 22–32)
Calcium: 9.9 mg/dL (ref 8.9–10.3)
Chloride: 91 mmol/L — ABNORMAL LOW (ref 98–111)
Creatinine: 1.06 mg/dL (ref 0.61–1.24)
GFR, Est AFR Am: >60 mL/min (ref >60)
GFR, Estimated: >60 mL/min (ref >60)
Glucose, Bld: 141 mg/dL — ABNORMAL HIGH (ref 70–99)
Potassium: 4.8 mmol/L (ref 3.5–5.1)
Sodium: 126 mmol/L — ABNORMAL LOW (ref 135–145)
Total Bilirubin: 0.5 mg/dL (ref 0.3–1.2)
Total Protein: 6.5 g/dL (ref 6.5–8.1)

## 2019-05-06 LAB — CBC WITH DIFFERENTIAL (CANCER CENTER ONLY)
Abs Immature Granulocytes: 0.02 10*3/uL (ref 0.00–0.07)
Basophils Absolute: 0 10*3/uL (ref 0.0–0.1)
Basophils Relative: 1 %
Eosinophils Absolute: 0.1 10*3/uL (ref 0.0–0.5)
Eosinophils Relative: 2 %
HCT: 35.7 % — ABNORMAL LOW (ref 39.0–52.0)
Hemoglobin: 12 g/dL — ABNORMAL LOW (ref 13.0–17.0)
Immature Granulocytes: 1 %
Lymphocytes Relative: 9 %
Lymphs Abs: 0.4 10*3/uL — ABNORMAL LOW (ref 0.7–4.0)
MCH: 29.5 pg (ref 26.0–34.0)
MCHC: 33.6 g/dL (ref 30.0–36.0)
MCV: 87.7 fL (ref 80.0–100.0)
Monocytes Absolute: 0.4 10*3/uL (ref 0.1–1.0)
Monocytes Relative: 10 %
Neutro Abs: 3.4 10*3/uL (ref 1.7–7.7)
Neutrophils Relative %: 77 %
Platelet Count: 160 10*3/uL (ref 150–400)
RBC: 4.07 MIL/uL — ABNORMAL LOW (ref 4.22–5.81)
RDW: 13.6 % (ref 11.5–15.5)
WBC Count: 4.4 10*3/uL (ref 4.0–10.5)
nRBC: 0 % (ref 0.0–0.2)

## 2019-05-06 LAB — LACTATE DEHYDROGENASE: LDH: 177 U/L (ref 98–192)

## 2019-05-06 MED ORDER — TEMAZEPAM 30 MG PO CAPS: 30.0000 mg | ORAL_CAPSULE | Freq: Every evening | ORAL | 2 refills | Status: DC | PRN

## 2019-05-06 MED ORDER — LORAZEPAM 1 MG PO TABS: 1.0000 mg | ORAL_TABLET | Freq: Four times a day (QID) | ORAL | 0 refills | Status: DC | PRN

## 2019-05-06 MED ORDER — HYDROCODONE-HOMATROPINE 5-1.5 MG/5ML PO SYRP: 5.0000 mL | ORAL_SOLUTION | ORAL | 0 refills | Status: DC | PRN

## 2019-05-06 MED FILL — LORAZEPAM 1 MG TABS: 1 | 15 days supply | Qty: 60 | Fill #0

## 2019-05-06 MED FILL — TEMAZEPAM 30 MG CAPSULE: 30 | 30 days supply | Qty: 30 | Fill #0

## 2019-05-06 MED FILL — HYDROCODONE-HOMATROPINE SOL: 5-1.5 | 13 days supply | Qty: 250 | Fill #0

## 2019-05-06 NOTE — Progress Notes (Signed)
Visited with patient for his follow up appointment today. His new treatment is still pending insurance authorization.  Patient is very upset, tearful and crying. He is upset with recent, rapid progression. He is very concerned for his wife. I sat and actively listened to his concerns and emotions. Reassured him that he followed all recommendations and this progression was in no way a result of his inaction. He is eager to start his new treatment but understands the process related to insurance. Expressed to him that we are hopeful that we will get authorization and be able to treat Friday of this week.   As of 3pm today, insurance approval is still pending.

## 2019-05-06 NOTE — Progress Notes (Signed)
Hematology and Oncology Follow Up Visit  Taylor Elliott [id number] 07/05/1951 68 y.o. 05/06/2019   Principle Diagnosis:   Extensive stage small cell lung cancer  SIADH secondary to small cell lung cancer  Current Therapy:           Carboplatinum/etoposide/Tecentriq-cycle #6  Atezolizumab 1200 mg IV q 3 week -- maintenance -  Start 01/21/2019  XRT for CNS mets -- completed on 03/27/2019  Lurbinectedin -- cycle #1 to start on 05/07/2019     Interim History:  Taylor Elliott is back for follow-up.  Unfortunately, we now have a problem.  He now has systemic recurrence.  We did a PET scan on him last week.  The PET scan shows recurrence in his lung, particularly in the right hilar area.  This is where his original tumor originated from.  He also had recurrence in his liver and also in the left adrenal gland.  This is truly devastating for him.  He is quite emotional over this.  I totally understand why he would be.  He finishes radiation to the brain a few weeks ago.  We will have to repeat an MRI of the brain to see if he has any new disease up in the brain.  He is done well with the Tecentriq.  Unfortunately, I do not think that immunotherapy will be necessary in the future given that he now has recurrence while taking the maintenance Tecentriq.  He has not lost weight.  He is still smoking about 5 cigarettes a day.  There is no problems with pain.  He does have a cough.  He is on Hycodan cough syrup which is helping.  He has had no problems with bowels or bladder.  He has had no problems with chest wall pain.  There is been no bleeding.  His legs are doing okay.  He has a brace on the left leg.  Overall, his performance status is ECOG 1.   Medications:  Current Outpatient Medications:  .  allopurinol (ZYLOPRIM) 100 MG tablet, TAKE 1 TABLET BY MOUTH 2 TIMES DAILY. (Patient taking differently: Take 100 mg by mouth daily. ), Disp: 180 tablet, Rfl: 3 .  amLODipine (NORVASC) 10 MG  tablet, TAKE 1 TABLET BY MOUTH EVERY MORNING, Disp: 90 tablet, Rfl: 1 .  aspirin EC 81 MG tablet, Take 81 mg by mouth daily., Disp: , Rfl:  .  brimonidine (ALPHAGAN) 0.2 % ophthalmic solution, Place 1 drop into the right eye 2 (two) times daily., Disp: 5 mL, Rfl: 12 .  calcium carbonate (TUMS - DOSED IN MG ELEMENTAL CALCIUM) 500 MG chewable tablet, Chew 2 tablets by mouth daily as needed for indigestion or heartburn. , Disp: , Rfl:  .  EPINEPHrine 0.3 mg/0.3 mL IJ SOAJ injection, Inject 0.3 mg into the muscle once., Disp: , Rfl:  .  fenofibrate 160 MG tablet, TAKE 1 TABLET (160 MG TOTAL) BY MOUTH DAILY., Disp: 90 tablet, Rfl: 1 .  HYDROcodone-acetaminophen (NORCO/VICODIN) 5-325 MG tablet, Take 1 tablet by mouth every 6 (six) hours as needed for moderate pain., Disp: 60 tablet, Rfl: 0 .  HYDROcodone-homatropine (HYCODAN) 5-1.5 MG/5ML syrup, Take 5 mLs by mouth every 4 (four) hours as needed for cough., Disp: 250 mL, Rfl: 0 .  lactulose (CHRONULAC) 10 GM/15ML solution, Take 15 mLs (10 g total) by mouth 2 (two) times daily as needed for mild constipation., Disp: 473 mL, Rfl: 1 .  lidocaine-prilocaine (EMLA) cream, Apply 1 application topically as needed (port access). , Disp: ,  Rfl:  .  lisinopril (ZESTRIL) 20 MG tablet, TAKE 1 TABLET BY MOUTH ONCE DAILY, Disp: 90 tablet, Rfl: 1 .  Multiple Vitamin (MULTIVITAMIN) tablet, Take 1 tablet by mouth daily., Disp: , Rfl:  .  prednisoLONE acetate (PRED FORTE) 1 % ophthalmic suspension, Place 1 drop into the right eye 4 (four) times daily. (Patient taking differently: Place 1 drop into the right eye 2 (two) times daily. ), Disp: 5 mL, Rfl: 0 .  predniSONE (DELTASONE) 20 MG tablet, Take 5 mg by mouth daily. , Disp: , Rfl:  .  pyridoxine (B-6) 200 MG tablet, Take 200 mg by mouth daily., Disp: , Rfl:  .  solifenacin (VESICARE) 10 MG tablet, Take 10 mg by mouth every evening., Disp: , Rfl:  .  temazepam (RESTORIL) 30 MG capsule, TAKE 1 CAPSULE (30 MG TOTAL) BY MOUTH  AT BEDTIME AS NEEDED FOR SLEEP., Disp: 30 capsule, Rfl: 0 .  UNABLE TO FIND, Med Name: Allergy shots., Disp: , Rfl:  .  bacitracin-polymyxin b (POLYSPORIN) ophthalmic ointment, Place 1 application into the right eye 3 (three) times daily. apply to eye every 12 hours while awake, Disp: 3.5 g, Rfl: 0 .  brimonidine (ALPHAGAN) 0.15 % ophthalmic solution, , Disp: , Rfl:  .  gatifloxacin (ZYMAXID) 0.5 % SOLN, Place 1 drop into the right eye 4 (four) times daily., Disp:  , Rfl:  .  LORazepam (ATIVAN) 1 MG tablet, Take 1 tablet (1 mg total) by mouth every 6 (six) hours as needed for anxiety., Disp: 60 tablet, Rfl: 0 .  rosuvastatin (CRESTOR) 10 MG tablet, TAKE 1 TABLET BY MOUTH ONCE DAILY (Patient not taking: Reported on 03/25/2019), Disp: 90 tablet, Rfl: 1  Allergies:  Allergies  Allergen Reactions  . Bee Venom Anaphylaxis  . Clindamycin/Lincomycin Hives    Past Medical History, Surgical history, Social history, and Family History were reviewed and updated.  Review of Systems: Review of Systems  Constitutional: Negative.   HENT:  Negative.   Eyes: Negative.   Respiratory: Positive for cough.   Cardiovascular: Negative.   Gastrointestinal: Negative.   Endocrine: Negative.   Genitourinary: Negative.    Musculoskeletal: Positive for back pain, myalgias and neck pain.  Skin: Positive for itching and rash.  Neurological: Negative.   Hematological: Negative.   Psychiatric/Behavioral: Negative.     Physical Exam:  weight is 203 lb (92.1 kg). His oral temperature is 97.6 F (36.4 C). His blood pressure is 170/83 (abnormal) and his pulse is 84. His respiration is 18 and oxygen saturation is 100%.   Wt Readings from Last 3 Encounters:  05/06/19 203 lb (92.1 kg)  04/27/19 201 lb 6 oz (91.3 kg)  04/15/19 196 lb (88.9 kg)    Physical Exam Vitals signs reviewed.  HENT:     Head: Normocephalic and atraumatic.  Eyes:     Pupils: Pupils are equal, round, and reactive to light.  Neck:      Musculoskeletal: Normal range of motion.  Cardiovascular:     Rate and Rhythm: Normal rate and regular rhythm.     Heart sounds: Normal heart sounds.  Pulmonary:     Effort: Pulmonary effort is normal.     Breath sounds: Normal breath sounds.  Abdominal:     General: Bowel sounds are normal.     Palpations: Abdomen is soft.     Comments: Abdominal exam shows a slightly obese abdomen.  He does have an umbilical hernia.  I really cannot palpate his liver at this point.  There is no fluid wave.  There is no inguinal adenopathy.  There is no splenomegaly.    Musculoskeletal: Normal range of motion.        General: No tenderness or deformity.  Lymphadenopathy:     Cervical: No cervical adenopathy.  Skin:    General: Skin is warm and dry.     Findings: No erythema or rash.  Neurological:     Mental Status: He is alert and oriented to person, place, and time.  Psychiatric:        Behavior: Behavior normal.        Thought Content: Thought content normal.        Judgment: Judgment normal.      Lab Results  Component Value Date   WBC 4.4 05/06/2019   HGB 12.0 (L) 05/06/2019   HCT 35.7 (L) 05/06/2019   MCV 87.7 05/06/2019   PLT 160 05/06/2019     Chemistry      Component Value Date/Time   NA 126 (L) 05/06/2019 0822   NA 135 (L) 07/12/2015 0813   K 4.8 05/06/2019 0822   K 4.5 07/12/2015 0813   CL 91 (L) 05/06/2019 0822   CO2 28 05/06/2019 0822   CO2 24 07/12/2015 0813   BUN 14 05/06/2019 0822   BUN 15.7 07/12/2015 0813   CREATININE 1.06 05/06/2019 0822   CREATININE 1.6 (H) 07/12/2015 0813      Component Value Date/Time   CALCIUM 9.9 05/06/2019 0822   CALCIUM 10.1 07/12/2015 0813   ALKPHOS 64 05/06/2019 0822   ALKPHOS 115 07/12/2015 0813   AST 24 05/06/2019 0822   AST 22 07/12/2015 0813   ALT 26 05/06/2019 0822   ALT 24 07/12/2015 0813   BILITOT 0.5 05/06/2019 0822   BILITOT 0.46 07/12/2015 0813       Impression and Plan: Mr. Dennington is a 68 year old white male.   He has a past history of renal cell carcinoma and also prostate cancer.  Now, he has a third malignancy.  This is metastatic small cell lung cancer.  We will now have to get him back onto his systemic therapy.  We will try him on lurbinectedin  (Zepzelca).  I think that this would be a reasonable choice for him.  This is almost like a targeted type of therapy.  I am talked to he about this.  His wife is on the cell phone listening.  We will have to try to get this approved by insurance.  Hopefully they will approve this for [place].  I spent about 45 minutes with him today.  We reviewed the PET scan.  We talked about the future.  He did not ask about any count at time frame.  We will try to get started with treatment this week.  Hopefully, we will see that he is responding.  The SIADH seems to be coming back.  His sodium is on the low side.  He is asymptomatic with this for right now.  We will have to watch this closely.  I would like to see him back when he starts his second cycle of lurbinectedin.   Volanda Napoleon, MD 10/7/20203:40 PM

## 2019-05-07 ENCOUNTER — Encounter: Payer: Self-pay | Admitting: Orthopedic Surgery

## 2019-05-07 ENCOUNTER — Encounter: Payer: Self-pay | Admitting: *Deleted

## 2019-05-07 ENCOUNTER — Other Ambulatory Visit: Payer: Self-pay | Admitting: *Deleted

## 2019-05-07 ENCOUNTER — Ambulatory Visit (INDEPENDENT_AMBULATORY_CARE_PROVIDER_SITE_OTHER): Payer: 59 | Admitting: Orthopedic Surgery

## 2019-05-07 VITALS — Ht 68.0 in | Wt 203.0 lb

## 2019-05-07 DIAGNOSIS — L97511 Non-pressure chronic ulcer of other part of right foot limited to breakdown of skin: Secondary | ICD-10-CM

## 2019-05-07 DIAGNOSIS — Z89432 Acquired absence of left foot: Secondary | ICD-10-CM | POA: Diagnosis not present

## 2019-05-07 MED ORDER — DEMECLOCYCLINE HCL 150 MG PO TABS: 150.0000 mg | ORAL_TABLET | Freq: Two times a day (BID) | ORAL | 2 refills | Status: DC

## 2019-05-07 MED FILL — DEMECLOCYCLINE 150 MG TABLE: 150 | 30 days supply | Qty: 60 | Fill #0

## 2019-05-07 NOTE — Progress Notes (Signed)
Called patient to notify him that his treatment was still pending insurance approval. Patient states he is experiencing symptoms of his low sodium including difficulty finding words when speaking. His sodium was low when seen in the office earlier this week.  Reviewed symptoms with Dr Marin Olp. He would like patient to restart his declomycin. Patient is aware and pharmacy confirmed.

## 2019-05-08 ENCOUNTER — Encounter: Payer: Self-pay | Admitting: *Deleted

## 2019-05-08 ENCOUNTER — Inpatient Hospital Stay: Payer: 59

## 2019-05-08 ENCOUNTER — Other Ambulatory Visit: Payer: Self-pay | Admitting: *Deleted

## 2019-05-08 ENCOUNTER — Other Ambulatory Visit: Payer: Self-pay

## 2019-05-08 VITALS — BP 123/63 | HR 86 | Temp 97.5°F | Resp 18

## 2019-05-08 DIAGNOSIS — C3491 Malignant neoplasm of unspecified part of right bronchus or lung: Secondary | ICD-10-CM

## 2019-05-08 DIAGNOSIS — F17210 Nicotine dependence, cigarettes, uncomplicated: Secondary | ICD-10-CM | POA: Diagnosis not present

## 2019-05-08 DIAGNOSIS — E871 Hypo-osmolality and hyponatremia: Secondary | ICD-10-CM | POA: Diagnosis not present

## 2019-05-08 DIAGNOSIS — Z85528 Personal history of other malignant neoplasm of kidney: Secondary | ICD-10-CM | POA: Diagnosis not present

## 2019-05-08 DIAGNOSIS — Z5111 Encounter for antineoplastic chemotherapy: Secondary | ICD-10-CM | POA: Diagnosis not present

## 2019-05-08 DIAGNOSIS — Z23 Encounter for immunization: Secondary | ICD-10-CM

## 2019-05-08 DIAGNOSIS — E860 Dehydration: Secondary | ICD-10-CM

## 2019-05-08 DIAGNOSIS — Z8546 Personal history of malignant neoplasm of prostate: Secondary | ICD-10-CM | POA: Diagnosis not present

## 2019-05-08 DIAGNOSIS — R05 Cough: Secondary | ICD-10-CM | POA: Diagnosis not present

## 2019-05-08 LAB — BASIC METABOLIC PANEL - CANCER CENTER ONLY
Anion gap: 8 (ref 5–15)
BUN: 10 mg/dL (ref 8–23)
CO2: 25 mmol/L (ref 22–32)
Calcium: 9.1 mg/dL (ref 8.9–10.3)
Chloride: 88 mmol/L — ABNORMAL LOW (ref 98–111)
Creatinine: 0.96 mg/dL (ref 0.61–1.24)
GFR, Est AFR Am: >60 mL/min (ref >60)
GFR, Estimated: >60 mL/min (ref >60)
Glucose, Bld: 113 mg/dL — ABNORMAL HIGH (ref 70–99)
Potassium: 3.6 mmol/L (ref 3.5–5.1)
Sodium: 121 mmol/L — ABNORMAL LOW (ref 135–145)

## 2019-05-08 MED ORDER — ONDANSETRON HCL 8 MG PO TABS: 8.0000 mg | ORAL_TABLET | Freq: Two times a day (BID) | ORAL | 1 refills | Status: DC | PRN

## 2019-05-08 MED ORDER — PALONOSETRON HCL INJECTION 0.25 MG/5ML
0.2500 mg | Freq: Once | INTRAVENOUS | Status: AC
  Administered 2019-05-08: 0.25 mg via INTRAVENOUS

## 2019-05-08 MED ORDER — SODIUM CHLORIDE 0.9 % IV SOLN
Freq: Once | INTRAVENOUS | Status: AC
  Administered 2019-05-08: 15:00:00 via INTRAVENOUS
  Filled 2019-05-08: qty 250

## 2019-05-08 MED ORDER — INFLUENZA VAC SPLIT QUAD 0.5 ML IM SUSY
0.5000 mL | PREFILLED_SYRINGE | Freq: Once | INTRAMUSCULAR | Status: AC
  Administered 2019-05-08: 0.5 mL via INTRAMUSCULAR

## 2019-05-08 MED ORDER — HEPARIN SOD (PORK) LOCK FLUSH 100 UNIT/ML IV SOLN
500.0000 [IU] | Freq: Once | INTRAVENOUS | Status: AC | PRN
  Administered 2019-05-08: 500 [IU]
  Filled 2019-05-08: qty 5

## 2019-05-08 MED ORDER — SODIUM CHLORIDE 0.9 % IV SOLN
Freq: Once | INTRAVENOUS | Status: AC
  Administered 2019-05-08: 16:00:00 via INTRAVENOUS
  Filled 2019-05-08: qty 250

## 2019-05-08 MED ORDER — SODIUM CHLORIDE 0.9 % IV SOLN
3.2000 mg/m2 | Freq: Once | INTRAVENOUS | Status: AC
  Administered 2019-05-08: 6.7 mg via INTRAVENOUS
  Filled 2019-05-08: qty 13.4

## 2019-05-08 MED ORDER — DEXAMETHASONE 4 MG PO TABS: 8.0000 mg | ORAL_TABLET | Freq: Every day | ORAL | 1 refills | Status: DC

## 2019-05-08 MED ORDER — DEXAMETHASONE SODIUM PHOSPHATE 10 MG/ML IJ SOLN
INTRAMUSCULAR | Status: AC
  Filled 2019-05-08: qty 1

## 2019-05-08 MED ORDER — PALONOSETRON HCL INJECTION 0.25 MG/5ML
INTRAVENOUS | Status: AC
  Filled 2019-05-08: qty 5

## 2019-05-08 MED ORDER — SODIUM CHLORIDE 0.9% FLUSH
10.0000 mL | INTRAVENOUS | Status: DC | PRN
  Administered 2019-05-08: 10 mL
  Filled 2019-05-08: qty 10

## 2019-05-08 MED ORDER — LIDOCAINE-PRILOCAINE 2.5-2.5 % EX CREA: TOPICAL_CREAM | CUTANEOUS | 3 refills | Status: DC

## 2019-05-08 MED ORDER — PROCHLORPERAZINE MALEATE 10 MG PO TABS: 10.0000 mg | ORAL_TABLET | Freq: Four times a day (QID) | ORAL | 1 refills | Status: DC | PRN

## 2019-05-08 MED ORDER — INFLUENZA VAC SPLIT QUAD 0.5 ML IM SUSY
PREFILLED_SYRINGE | INTRAMUSCULAR | Status: AC
  Filled 2019-05-08: qty 0.5

## 2019-05-08 MED ORDER — LORAZEPAM 0.5 MG PO TABS: 0.5000 mg | ORAL_TABLET | Freq: Four times a day (QID) | ORAL | 0 refills | Status: DC | PRN

## 2019-05-08 MED ORDER — DEXAMETHASONE SODIUM PHOSPHATE 10 MG/ML IJ SOLN
10.0000 mg | Freq: Once | INTRAMUSCULAR | Status: AC
  Administered 2019-05-08: 10 mg via INTRAVENOUS

## 2019-05-08 MED FILL — PROCHLORPERAZINE 10 MG TAB: 10 | 7 days supply | Qty: 30 | Fill #0

## 2019-05-08 MED FILL — LIDOCAINE-PRILOCAINE CREAM: 2.5-2.5 | 10 days supply | Qty: 30 | Fill #0

## 2019-05-08 MED FILL — DEXAMETHASONE 4 MG TABLET: 4 | 15 days supply | Qty: 30 | Fill #0

## 2019-05-08 MED FILL — ONDANSETRON HCL 8 MG TABLET: 8 | 15 days supply | Qty: 30 | Fill #0

## 2019-05-08 NOTE — Progress Notes (Signed)
OK to give flu shot per Dr. Marin Olp.

## 2019-05-08 NOTE — Progress Notes (Signed)
Unable to use alaris pump for new chemotherapy drug that is not currently in the alaris formulary. Pump was programmed manually.

## 2019-05-08 NOTE — Patient Instructions (Signed)
[facility] Discharge Instructions for Patients Receiving Chemotherapy  Today you received the following chemotherapy agents Zepzelca.  To help prevent nausea and vomiting after your treatment, we encourage you to take your nausea medication as indicated by your MD.   If you develop nausea and vomiting that is not controlled by your nausea medication, call the clinic.   BELOW ARE SYMPTOMS THAT SHOULD BE REPORTED IMMEDIATELY:  *FEVER GREATER THAN 100.5 F  *CHILLS WITH OR WITHOUT FEVER  NAUSEA AND VOMITING THAT IS NOT CONTROLLED WITH YOUR NAUSEA MEDICATION  *UNUSUAL SHORTNESS OF BREATH  *UNUSUAL BRUISING OR BLEEDING  TENDERNESS IN MOUTH AND THROAT WITH OR WITHOUT PRESENCE OF ULCERS  *URINARY PROBLEMS  *BOWEL PROBLEMS  UNUSUAL RASH Items with * indicate a potential emergency and should be followed up as soon as possible.  Feel free to call the clinic should you have any questions or concerns. The clinic phone number is [phone].  Please show the [facility] at check-in to the Emergency Department and triage nurse.

## 2019-05-08 NOTE — Progress Notes (Signed)
Received update from financial specialist, Lendell Caprice, that treatment was approved. Spoke with Dr Marin Olp. He would like patient to come in this afternoon for chemo. He would also like to recheck his BMP.   Patient called. He will come in today for treatment. Order placed for BMP. Dr Marin Olp states to use labs from Wednesday, 05/06/19, to administer chemo.

## 2019-05-08 NOTE — Patient Instructions (Signed)

## 2019-05-11 ENCOUNTER — Other Ambulatory Visit: Payer: 59

## 2019-05-11 ENCOUNTER — Other Ambulatory Visit: Payer: Self-pay

## 2019-05-11 ENCOUNTER — Encounter: Payer: Self-pay | Admitting: *Deleted

## 2019-05-11 ENCOUNTER — Ambulatory Visit: Payer: 59

## 2019-05-11 ENCOUNTER — Encounter (INDEPENDENT_AMBULATORY_CARE_PROVIDER_SITE_OTHER): Payer: 59 | Admitting: Ophthalmology

## 2019-05-11 DIAGNOSIS — H338 Other retinal detachments: Secondary | ICD-10-CM | POA: Diagnosis not present

## 2019-05-12 ENCOUNTER — Other Ambulatory Visit: Payer: Self-pay | Admitting: Hematology & Oncology

## 2019-05-12 ENCOUNTER — Telehealth: Payer: Self-pay | Admitting: *Deleted

## 2019-05-12 ENCOUNTER — Inpatient Hospital Stay: Payer: 59

## 2019-05-12 ENCOUNTER — Ambulatory Visit: Payer: 59

## 2019-05-12 ENCOUNTER — Encounter: Payer: Self-pay | Admitting: Orthopedic Surgery

## 2019-05-12 ENCOUNTER — Other Ambulatory Visit: Payer: 59

## 2019-05-12 DIAGNOSIS — E860 Dehydration: Secondary | ICD-10-CM | POA: Diagnosis not present

## 2019-05-12 DIAGNOSIS — R05 Cough: Secondary | ICD-10-CM | POA: Diagnosis not present

## 2019-05-12 DIAGNOSIS — Z23 Encounter for immunization: Secondary | ICD-10-CM | POA: Diagnosis not present

## 2019-05-12 DIAGNOSIS — C3491 Malignant neoplasm of unspecified part of right bronchus or lung: Secondary | ICD-10-CM | POA: Diagnosis not present

## 2019-05-12 DIAGNOSIS — Z5111 Encounter for antineoplastic chemotherapy: Secondary | ICD-10-CM | POA: Diagnosis not present

## 2019-05-12 DIAGNOSIS — F17210 Nicotine dependence, cigarettes, uncomplicated: Secondary | ICD-10-CM | POA: Diagnosis not present

## 2019-05-12 DIAGNOSIS — Z85528 Personal history of other malignant neoplasm of kidney: Secondary | ICD-10-CM | POA: Diagnosis not present

## 2019-05-12 DIAGNOSIS — Z8546 Personal history of malignant neoplasm of prostate: Secondary | ICD-10-CM | POA: Diagnosis not present

## 2019-05-12 DIAGNOSIS — E871 Hypo-osmolality and hyponatremia: Secondary | ICD-10-CM | POA: Diagnosis not present

## 2019-05-12 LAB — CBC WITH DIFFERENTIAL/PLATELET
Abs Immature Granulocytes: 0.01 10*3/uL (ref 0.00–0.07)
Basophils Absolute: 0 10*3/uL (ref 0.0–0.1)
Basophils Relative: 1 %
Eosinophils Absolute: 0 10*3/uL (ref 0.0–0.5)
Eosinophils Relative: 1 %
HCT: 33.9 % — ABNORMAL LOW (ref 39.0–52.0)
Hemoglobin: 11.5 g/dL — ABNORMAL LOW (ref 13.0–17.0)
Immature Granulocytes: 0 %
Lymphocytes Relative: 7 %
Lymphs Abs: 0.3 10*3/uL — ABNORMAL LOW (ref 0.7–4.0)
MCH: 29.9 pg (ref 26.0–34.0)
MCHC: 33.9 g/dL (ref 30.0–36.0)
MCV: 88.3 fL (ref 80.0–100.0)
Monocytes Absolute: 0.1 10*3/uL (ref 0.1–1.0)
Monocytes Relative: 3 %
Neutro Abs: 3.9 10*3/uL (ref 1.7–7.7)
Neutrophils Relative %: 88 %
Platelets: 143 10*3/uL — ABNORMAL LOW (ref 150–400)
RBC: 3.84 MIL/uL — ABNORMAL LOW (ref 4.22–5.81)
RDW: 14.1 % (ref 11.5–15.5)
WBC: 4.4 10*3/uL (ref 4.0–10.5)
nRBC: 0 % (ref 0.0–0.2)

## 2019-05-12 LAB — COMPREHENSIVE METABOLIC PANEL
ALT: 138 U/L — ABNORMAL HIGH (ref 0–44)
AST: 53 U/L — ABNORMAL HIGH (ref 15–41)
Albumin: 3.9 g/dL (ref 3.5–5.0)
Alkaline Phosphatase: 58 U/L (ref 38–126)
Anion gap: 6 (ref 5–15)
BUN: 22 mg/dL (ref 8–23)
CO2: 26 mmol/L (ref 22–32)
Calcium: 9.9 mg/dL (ref 8.9–10.3)
Chloride: 93 mmol/L — ABNORMAL LOW (ref 98–111)
Creatinine, Ser: 1.16 mg/dL (ref 0.61–1.24)
GFR calc Af Amer: >60 mL/min (ref >60)
GFR calc non Af Amer: >60 mL/min (ref >60)
Glucose, Bld: 114 mg/dL — ABNORMAL HIGH (ref 70–99)
Potassium: 4.9 mmol/L (ref 3.5–5.1)
Sodium: 125 mmol/L — ABNORMAL LOW (ref 135–145)
Total Bilirubin: 0.4 mg/dL (ref 0.3–1.2)
Total Protein: 6 g/dL — ABNORMAL LOW (ref 6.5–8.1)

## 2019-05-12 MED ORDER — SODIUM CHLORIDE 0.9 % IV SOLN
Freq: Once | INTRAVENOUS | Status: AC
  Administered 2019-05-12: 13:00:00 via INTRAVENOUS
  Filled 2019-05-12: qty 250

## 2019-05-12 NOTE — Telephone Encounter (Signed)
Dr. Marin Olp notified of Sodium-125.  Orders received from Dr. Marin Olp for pt to get 500 ml of NS with treatment today.

## 2019-05-12 NOTE — Progress Notes (Signed)
Na 125.  Dr. Marin Olp notified of labs.  Ordered 500 cc .9 NS
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
## 2019-05-12 NOTE — Patient Instructions (Signed)
Hyponatremia Hyponatremia is when the amount of salt (sodium) in your blood is too low. When salt levels are low, your body may take in extra water. This can cause swelling throughout the body. The swelling often affects the brain. What are the causes? This condition may be caused by:  Certain medical problems or conditions.  Vomiting a lot.  Having watery poop (diarrhea) often.  Certain medicines or illegal drugs.  Not having enough water in the body (dehydration).  Drinking too much water.  Eating a diet that is low in salt.  Large burns on your body.  Too much sweating. What increases the risk? You are more likely to get this condition if you:  Have long-term (chronic) kidney disease.  Have heart failure.  Have a medical condition that causes you to have watery poop often.  Do very hard exercises.  Take medicines that affect the amount of salt is in your blood. What are the signs or symptoms? Symptoms of this condition include:  Headache.  Feeling like you may vomit (nausea).  Vomiting.  Being very tired (lethargic).  Muscle weakness and cramps.  Not wanting to eat as much as normal (loss of appetite).  Feeling weak or light-headed. Severe symptoms of this condition include:  Confusion.  Feeling restless (agitation).  Having a fast heart rate.  Passing out (fainting).  Seizures.  Coma. How is this treated? Treatment for this condition depends on the cause. Treatment may include:  Getting fluids through an IV tube that is put into one of your veins.  Taking medicines to fix the salt levels in your blood. If medicines are causing the problem, your medicines will need to be changed.  Limiting how much water or fluid you take in.  Monitoring in the hospital to watch your symptoms. Follow these instructions at home:   Take over-the-counter and prescription medicines only as told by your doctor. Many medicines can make this condition worse.  Talk with your doctor about any medicines that you are taking.  Eat and drink exactly as you are told by your doctor. ? Eat only the foods you are told to eat. ? Limit how much fluid you take.  Do not drink alcohol.  Keep all follow-up visits as told by your doctor. This is important. Contact a doctor if:  You feel more like you may vomit.  You feel more tired.  Your headache gets worse.  You feel more confused.  You feel weaker.  Your symptoms go away and then they come back.  You have trouble following the diet instructions. Get help right away if:  You have a seizure.  You pass out.  You keep having watery poop.  You keep vomiting. Summary  Hyponatremia is when the amount of salt in your blood is too low.  When salt levels are low, you can have swelling throughout the body. The swelling mostly affects the brain.  Treatment depends on the cause. Treatment may include getting IV fluids, medicines, or not drinking as much fluid. This information is not intended to replace advice given to you by your health care provider. Make sure you discuss any questions you have with your health care provider. Document Released: 03/28/2011 Document Revised: 10/02/2018 Document Reviewed: 06/19/2018 Elsevier Patient Education  2020 [employer].

## 2019-05-14 ENCOUNTER — Encounter: Payer: Self-pay | Admitting: Radiation Oncology

## 2019-05-15 ENCOUNTER — Telehealth: Payer: Self-pay | Admitting: *Deleted

## 2019-05-15 ENCOUNTER — Other Ambulatory Visit: Payer: Self-pay

## 2019-05-15 ENCOUNTER — Inpatient Hospital Stay: Payer: 59

## 2019-05-15 ENCOUNTER — Other Ambulatory Visit: Payer: Self-pay | Admitting: *Deleted

## 2019-05-15 DIAGNOSIS — E871 Hypo-osmolality and hyponatremia: Secondary | ICD-10-CM

## 2019-05-15 DIAGNOSIS — Z8546 Personal history of malignant neoplasm of prostate: Secondary | ICD-10-CM | POA: Diagnosis not present

## 2019-05-15 DIAGNOSIS — F17210 Nicotine dependence, cigarettes, uncomplicated: Secondary | ICD-10-CM | POA: Diagnosis not present

## 2019-05-15 DIAGNOSIS — E860 Dehydration: Secondary | ICD-10-CM

## 2019-05-15 DIAGNOSIS — R05 Cough: Secondary | ICD-10-CM | POA: Diagnosis not present

## 2019-05-15 DIAGNOSIS — C3491 Malignant neoplasm of unspecified part of right bronchus or lung: Secondary | ICD-10-CM

## 2019-05-15 DIAGNOSIS — Z85528 Personal history of other malignant neoplasm of kidney: Secondary | ICD-10-CM | POA: Diagnosis not present

## 2019-05-15 DIAGNOSIS — Z5111 Encounter for antineoplastic chemotherapy: Secondary | ICD-10-CM | POA: Diagnosis not present

## 2019-05-15 DIAGNOSIS — Z23 Encounter for immunization: Secondary | ICD-10-CM | POA: Diagnosis not present

## 2019-05-15 LAB — CBC WITH DIFFERENTIAL (CANCER CENTER ONLY)
Abs Immature Granulocytes: 0.04 10*3/uL (ref 0.00–0.07)
Basophils Absolute: 0 10*3/uL (ref 0.0–0.1)
Basophils Relative: 1 %
Eosinophils Absolute: 0 10*3/uL (ref 0.0–0.5)
Eosinophils Relative: 2 %
HCT: 31.1 % — ABNORMAL LOW (ref 39.0–52.0)
Hemoglobin: 11 g/dL — ABNORMAL LOW (ref 13.0–17.0)
Immature Granulocytes: 2 %
Lymphocytes Relative: 14 %
Lymphs Abs: 0.3 10*3/uL — ABNORMAL LOW (ref 0.7–4.0)
MCH: 30.1 pg (ref 26.0–34.0)
MCHC: 35.4 g/dL (ref 30.0–36.0)
MCV: 85 fL (ref 80.0–100.0)
Monocytes Absolute: 0.1 10*3/uL (ref 0.1–1.0)
Monocytes Relative: 3 %
Neutro Abs: 2 10*3/uL (ref 1.7–7.7)
Neutrophils Relative %: 78 %
Platelet Count: 119 10*3/uL — ABNORMAL LOW (ref 150–400)
RBC: 3.66 MIL/uL — ABNORMAL LOW (ref 4.22–5.81)
RDW: 13.6 % (ref 11.5–15.5)
WBC Count: 2.5 10*3/uL — ABNORMAL LOW (ref 4.0–10.5)
nRBC: 0 % (ref 0.0–0.2)

## 2019-05-15 LAB — CMP (CANCER CENTER ONLY)
ALT: 81 U/L — ABNORMAL HIGH (ref 0–44)
AST: 32 U/L (ref 15–41)
Albumin: 3.8 g/dL (ref 3.5–5.0)
Alkaline Phosphatase: 70 U/L (ref 38–126)
Anion gap: 6 (ref 5–15)
BUN: 15 mg/dL (ref 8–23)
CO2: 23 mmol/L (ref 22–32)
Calcium: 8.9 mg/dL (ref 8.9–10.3)
Chloride: 90 mmol/L — ABNORMAL LOW (ref 98–111)
Creatinine: 1.02 mg/dL (ref 0.61–1.24)
GFR, Est AFR Am: >60 mL/min (ref >60)
GFR, Estimated: >60 mL/min (ref >60)
Glucose, Bld: 100 mg/dL — ABNORMAL HIGH (ref 70–99)
Potassium: 4.3 mmol/L (ref 3.5–5.1)
Sodium: 119 mmol/L — CL (ref 135–145)
Total Bilirubin: 0.5 mg/dL (ref 0.3–1.2)
Total Protein: 5.7 g/dL — ABNORMAL LOW (ref 6.5–8.1)

## 2019-05-15 MED ORDER — LORAZEPAM 2 MG/ML IJ SOLN
INTRAMUSCULAR | Status: AC
  Filled 2019-05-15: qty 1

## 2019-05-15 MED ORDER — SODIUM CHLORIDE 0.9 % IV SOLN
Freq: Once | INTRAVENOUS | Status: AC
  Administered 2019-05-15: 13:00:00 via INTRAVENOUS
  Filled 2019-05-15: qty 250

## 2019-05-15 MED ORDER — LORAZEPAM 2 MG/ML IJ SOLN
1.0000 mg | Freq: Once | INTRAMUSCULAR | Status: AC
  Administered 2019-05-15: 1 mg via INTRAVENOUS

## 2019-05-15 NOTE — Telephone Encounter (Signed)
Dr. Marin Olp notified of [place].  No new orders received at this time.

## 2019-05-15 NOTE — Telephone Encounter (Signed)
Message received from patient stating that he started yesterday with explosive diarrhea which has slowed down this morning d/t taking Imodium and that he vomited x1 yesterday.  Dr. Marin Olp notified and order received for pt to come in now for labs and IVF's.  Call placed to patient to notify him of MD orders.  Pt states that he will come to office now.

## 2019-05-15 NOTE — Patient Instructions (Signed)
Dehydration, Adult  Dehydration is when there is not enough fluid or water in your body. This happens when you lose more fluids than you take in. Dehydration can range from mild to very bad. It should be treated right away to keep it from getting very bad. Symptoms of mild dehydration may include:  Thirst.  Dry lips.  Slightly dry mouth.  Dry, warm skin.  Dizziness. Symptoms of moderate dehydration may include:  Very dry mouth.  Muscle cramps.  Dark pee (urine). Pee may be the color of tea.  Your body making less pee.  Your eyes making fewer tears.  Heartbeat that is uneven or faster than normal (palpitations).  Headache.  Light-headedness, especially when you stand up from sitting.  Fainting (syncope). Symptoms of very bad dehydration may include:  Changes in skin, such as: ? Cold and clammy skin. ? Blotchy (mottled) or pale skin. ? Skin that does not quickly return to normal after being lightly pinched and let go (poor skin turgor).  Changes in body fluids, such as: ? Feeling very thirsty. ? Your eyes making fewer tears. ? Not sweating when body temperature is high, such as in hot weather. ? Your body making very little pee.  Changes in vital signs, such as: ? Weak pulse. ? Pulse that is more than 100 beats a minute when you are sitting still. ? Fast breathing. ? Low blood pressure.  Other changes, such as: ? Sunken eyes. ? Cold hands and feet. ? Confusion. ? Lack of energy (lethargy). ? Trouble waking up from sleep. ? Short-term weight loss. ? Unconsciousness. Follow these instructions at home:   If told by your doctor, drink an ORS: ? Make an ORS by using instructions on the package. ? Start by drinking small amounts, about  cup (120 mL) every 5-10 minutes. ? Slowly drink more until you have had the amount that your doctor said to have.  Drink enough clear fluid to keep your pee clear or pale yellow. If you were told to drink an ORS, finish the  ORS first, then start slowly drinking clear fluids. Drink fluids such as: ? Water. Do not drink only water by itself. Doing that can make the salt (sodium) level in your body get too low (hyponatremia). ? Ice chips. ? Fruit juice that you have added water to (diluted). ? Low-calorie sports drinks.  Avoid: ? Alcohol. ? Drinks that have a lot of sugar. These include high-calorie sports drinks, fruit juice that does not have water added, and soda. ? Caffeine. ? Foods that are greasy or have a lot of fat or sugar.  Take over-the-counter and prescription medicines only as told by your doctor.  Do not take salt tablets. Doing that can make the salt level in your body get too high (hypernatremia).  Eat foods that have minerals (electrolytes). Examples include bananas, oranges, potatoes, tomatoes, and spinach.  Keep all follow-up visits as told by your doctor. This is important. Contact a doctor if:  You have belly (abdominal) pain that: ? Gets worse. ? Stays in one area (localizes).  You have a rash.  You have a stiff neck.  You get angry or annoyed more easily than normal (irritability).  You are more sleepy than normal.  You have a harder time waking up than normal.  You feel: ? Weak. ? Dizzy. ? Very thirsty.  You have peed (urinated) only a small amount of very dark pee during 6-8 hours. Get help right away if:  You have   symptoms of very bad dehydration.  You cannot drink fluids without throwing up (vomiting).  Your symptoms get worse with treatment.  You have a fever.  You have a very bad headache.  You are throwing up or having watery poop (diarrhea) and it: ? Gets worse. ? Does not go away.  You have blood or something green (bile) in your throw-up.  You have blood in your poop (stool). This may cause poop to look black and tarry.  You have not peed in 6-8 hours.  You pass out (faint).  Your heart rate when you are sitting still is more than 100 beats a  minute.  You have trouble breathing. This information is not intended to replace advice given to you by your health care provider. Make sure you discuss any questions you have with your health care provider. Document Released: 05/12/2009 Document Revised: 06/28/2017 Document Reviewed: 09/09/2015 Elsevier Patient Education  2020 Elsevier Inc.  

## 2019-05-15 NOTE — Patient Instructions (Signed)
Implanted Port Insertion, Care After This sheet gives you information about how to care for yourself after your procedure. Your health care provider may also give you more specific instructions. If you have problems or questions, contact your health care provider. What can I expect after the procedure? After the procedure, it is common to have:  Discomfort at the port insertion site.  Bruising on the skin over the port. This should improve over 3-4 days. Follow these instructions at home: Port care  After your port is placed, you will get a manufacturer's information card. The card has information about your port. Keep this card with you at all times.  Take care of the port as told by your health care provider. Ask your health care provider if you or a family member can get training for taking care of the port at home. A home health care nurse may also take care of the port.  Make sure to remember what type of port you have. Incision care      Follow instructions from your health care provider about how to take care of your port insertion site. Make sure you: ? Wash your hands with soap and water before and after you change your bandage (dressing). If soap and water are not available, use hand sanitizer. ? Change your dressing as told by your health care provider. ? Leave stitches (sutures), skin glue, or adhesive strips in place. These skin closures may need to stay in place for 2 weeks or longer. If adhesive strip edges start to loosen and curl up, you may trim the loose edges. Do not remove adhesive strips completely unless your health care provider tells you to do that.  Check your port insertion site every day for signs of infection. Check for: ? Redness, swelling, or pain. ? Fluid or blood. ? Warmth. ? Pus or a bad smell. Activity  Return to your normal activities as told by your health care provider. Ask your health care provider what activities are safe for you.  Do not  lift anything that is heavier than 10 lb (4.5 kg), or the limit that you are told, until your health care provider says that it is safe. General instructions  Take over-the-counter and prescription medicines only as told by your health care provider.  Do not take baths, swim, or use a hot tub until your health care provider approves. Ask your health care provider if you may take showers. You may only be allowed to take sponge baths.  Do not drive for 24 hours if you were given a sedative during your procedure.  Wear a medical alert bracelet in case of an emergency. This will tell any health care providers that you have a port.  Keep all follow-up visits as told by your health care provider. This is important. Contact a health care provider if:  You cannot flush your port with saline as directed, or you cannot draw blood from the port.  You have a fever or chills.  You have redness, swelling, or pain around your port insertion site.  You have fluid or blood coming from your port insertion site.  Your port insertion site feels warm to the touch.  You have pus or a bad smell coming from the port insertion site. Get help right away if:  You have chest pain or shortness of breath.  You have bleeding from your port that you cannot control. Summary  Take care of the port as told by your health   care provider. Keep the manufacturer's information card with you at all times.  Change your dressing as told by your health care provider.  Contact a health care provider if you have a fever or chills or if you have redness, swelling, or pain around your port insertion site.  Keep all follow-up visits as told by your health care provider. This information is not intended to replace advice given to you by your health care provider. Make sure you discuss any questions you have with your health care provider. Document Released: 05/06/2013 Document Revised: 02/11/2018 Document Reviewed: 02/11/2018  Elsevier Patient Education  2020 Elsevier Inc.  

## 2019-05-15 NOTE — Progress Notes (Signed)
Sodium 119.  Dr. Marin Olp notified.  Ordered an additional liter of fluid

## 2019-05-18 MED FILL — SOLIFENACIN SUCCINATE 10 MG: 10 | 90 days supply | Qty: 90 | Fill #2

## 2019-05-19 ENCOUNTER — Telehealth: Payer: Self-pay | Admitting: *Deleted

## 2019-05-19 ENCOUNTER — Inpatient Hospital Stay: Payer: 59

## 2019-05-19 ENCOUNTER — Other Ambulatory Visit: Payer: Self-pay

## 2019-05-19 VITALS — BP 158/92 | HR 82 | Resp 18

## 2019-05-19 DIAGNOSIS — Z8546 Personal history of malignant neoplasm of prostate: Secondary | ICD-10-CM | POA: Diagnosis not present

## 2019-05-19 DIAGNOSIS — E871 Hypo-osmolality and hyponatremia: Secondary | ICD-10-CM | POA: Diagnosis not present

## 2019-05-19 DIAGNOSIS — F17210 Nicotine dependence, cigarettes, uncomplicated: Secondary | ICD-10-CM | POA: Diagnosis not present

## 2019-05-19 DIAGNOSIS — Z85528 Personal history of other malignant neoplasm of kidney: Secondary | ICD-10-CM | POA: Diagnosis not present

## 2019-05-19 DIAGNOSIS — E860 Dehydration: Secondary | ICD-10-CM | POA: Diagnosis not present

## 2019-05-19 DIAGNOSIS — C3491 Malignant neoplasm of unspecified part of right bronchus or lung: Secondary | ICD-10-CM | POA: Diagnosis not present

## 2019-05-19 DIAGNOSIS — Z23 Encounter for immunization: Secondary | ICD-10-CM | POA: Diagnosis not present

## 2019-05-19 DIAGNOSIS — R05 Cough: Secondary | ICD-10-CM | POA: Diagnosis not present

## 2019-05-19 DIAGNOSIS — Z5111 Encounter for antineoplastic chemotherapy: Secondary | ICD-10-CM | POA: Diagnosis not present

## 2019-05-19 LAB — COMPREHENSIVE METABOLIC PANEL
ALT: 46 U/L — ABNORMAL HIGH (ref 0–44)
AST: 20 U/L (ref 15–41)
Albumin: 3.9 g/dL (ref 3.5–5.0)
Alkaline Phosphatase: 76 U/L (ref 38–126)
Anion gap: 6 (ref 5–15)
BUN: 9 mg/dL (ref 8–23)
CO2: 23 mmol/L (ref 22–32)
Calcium: 8.9 mg/dL (ref 8.9–10.3)
Chloride: 91 mmol/L — ABNORMAL LOW (ref 98–111)
Creatinine, Ser: 0.92 mg/dL (ref 0.61–1.24)
GFR calc Af Amer: >60 mL/min (ref >60)
GFR calc non Af Amer: >60 mL/min (ref >60)
Glucose, Bld: 115 mg/dL — ABNORMAL HIGH (ref 70–99)
Potassium: 3.8 mmol/L (ref 3.5–5.1)
Sodium: 120 mmol/L — ABNORMAL LOW (ref 135–145)
Total Bilirubin: 0.4 mg/dL (ref 0.3–1.2)
Total Protein: 5.6 g/dL — ABNORMAL LOW (ref 6.5–8.1)

## 2019-05-19 LAB — CBC WITH DIFFERENTIAL/PLATELET
Abs Immature Granulocytes: 0.01 10*3/uL (ref 0.00–0.07)
Basophils Absolute: 0 10*3/uL (ref 0.0–0.1)
Basophils Relative: 1 %
Eosinophils Absolute: 0 10*3/uL (ref 0.0–0.5)
Eosinophils Relative: 1 %
HCT: 29.9 % — ABNORMAL LOW (ref 39.0–52.0)
Hemoglobin: 10.6 g/dL — ABNORMAL LOW (ref 13.0–17.0)
Immature Granulocytes: 1 %
Lymphocytes Relative: 22 %
Lymphs Abs: 0.2 10*3/uL — ABNORMAL LOW (ref 0.7–4.0)
MCH: 29.9 pg (ref 26.0–34.0)
MCHC: 35.5 g/dL (ref 30.0–36.0)
MCV: 84.2 fL (ref 80.0–100.0)
Monocytes Absolute: 0.1 10*3/uL (ref 0.1–1.0)
Monocytes Relative: 14 %
Neutro Abs: 0.6 10*3/uL — ABNORMAL LOW (ref 1.7–7.7)
Neutrophils Relative %: 61 %
Platelets: 132 10*3/uL — ABNORMAL LOW (ref 150–400)
RBC: 3.55 MIL/uL — ABNORMAL LOW (ref 4.22–5.81)
RDW: 13.5 % (ref 11.5–15.5)
WBC: 1 10*3/uL — ABNORMAL LOW (ref 4.0–10.5)
nRBC: 0 % (ref 0.0–0.2)

## 2019-05-19 MED ORDER — SODIUM CHLORIDE 0.9% FLUSH
10.0000 mL | INTRAVENOUS | Status: DC | PRN
  Administered 2019-05-19: 10 mL via INTRAVENOUS
  Filled 2019-05-19: qty 10

## 2019-05-19 MED ORDER — SODIUM CHLORIDE 0.9 % IV SOLN
INTRAVENOUS | Status: DC
  Administered 2019-05-19: 12:00:00 via INTRAVENOUS
  Filled 2019-05-19 (×2): qty 250

## 2019-05-19 MED ORDER — HEPARIN SOD (PORK) LOCK FLUSH 100 UNIT/ML IV SOLN
500.0000 [IU] | Freq: Once | INTRAVENOUS | Status: AC
  Administered 2019-05-19: 500 [IU] via INTRAVENOUS
  Filled 2019-05-19: qty 5

## 2019-05-19 NOTE — Patient Instructions (Signed)
Dehydration, Adult  Dehydration is when there is not enough fluid or water in your body. This happens when you lose more fluids than you take in. Dehydration can range from mild to very bad. It should be treated right away to keep it from getting very bad. Symptoms of mild dehydration may include:  Thirst.  Dry lips.  Slightly dry mouth.  Dry, warm skin.  Dizziness. Symptoms of moderate dehydration may include:  Very dry mouth.  Muscle cramps.  Dark pee (urine). Pee may be the color of tea.  Your body making less pee.  Your eyes making fewer tears.  Heartbeat that is uneven or faster than normal (palpitations).  Headache.  Light-headedness, especially when you stand up from sitting.  Fainting (syncope). Symptoms of very bad dehydration may include:  Changes in skin, such as: ? Cold and clammy skin. ? Blotchy (mottled) or pale skin. ? Skin that does not quickly return to normal after being lightly pinched and let go (poor skin turgor).  Changes in body fluids, such as: ? Feeling very thirsty. ? Your eyes making fewer tears. ? Not sweating when body temperature is high, such as in hot weather. ? Your body making very little pee.  Changes in vital signs, such as: ? Weak pulse. ? Pulse that is more than 100 beats a minute when you are sitting still. ? Fast breathing. ? Low blood pressure.  Other changes, such as: ? Sunken eyes. ? Cold hands and feet. ? Confusion. ? Lack of energy (lethargy). ? Trouble waking up from sleep. ? Short-term weight loss. ? Unconsciousness. Follow these instructions at home:   If told by your doctor, drink an ORS: ? Make an ORS by using instructions on the package. ? Start by drinking small amounts, about  cup (120 mL) every 5-10 minutes. ? Slowly drink more until you have had the amount that your doctor said to have.  Drink enough clear fluid to keep your pee clear or pale yellow. If you were told to drink an ORS, finish the  ORS first, then start slowly drinking clear fluids. Drink fluids such as: ? Water. Do not drink only water by itself. Doing that can make the salt (sodium) level in your body get too low (hyponatremia). ? Ice chips. ? Fruit juice that you have added water to (diluted). ? Low-calorie sports drinks.  Avoid: ? Alcohol. ? Drinks that have a lot of sugar. These include high-calorie sports drinks, fruit juice that does not have water added, and soda. ? Caffeine. ? Foods that are greasy or have a lot of fat or sugar.  Take over-the-counter and prescription medicines only as told by your doctor.  Do not take salt tablets. Doing that can make the salt level in your body get too high (hypernatremia).  Eat foods that have minerals (electrolytes). Examples include bananas, oranges, potatoes, tomatoes, and spinach.  Keep all follow-up visits as told by your doctor. This is important. Contact a doctor if:  You have belly (abdominal) pain that: ? Gets worse. ? Stays in one area (localizes).  You have a rash.  You have a stiff neck.  You get angry or annoyed more easily than normal (irritability).  You are more sleepy than normal.  You have a harder time waking up than normal.  You feel: ? Weak. ? Dizzy. ? Very thirsty.  You have peed (urinated) only a small amount of very dark pee during 6-8 hours. Get help right away if:  You have   symptoms of very bad dehydration.  You cannot drink fluids without throwing up (vomiting).  Your symptoms get worse with treatment.  You have a fever.  You have a very bad headache.  You are throwing up or having watery poop (diarrhea) and it: ? Gets worse. ? Does not go away.  You have blood or something green (bile) in your throw-up.  You have blood in your poop (stool). This may cause poop to look black and tarry.  You have not peed in 6-8 hours.  You pass out (faint).  Your heart rate when you are sitting still is more than 100 beats a  minute.  You have trouble breathing. This information is not intended to replace advice given to you by your health care provider. Make sure you discuss any questions you have with your health care provider. Document Released: 05/12/2009 Document Revised: 06/28/2017 Document Reviewed: 09/09/2015 Elsevier Patient Education  2020 Elsevier Inc.  

## 2019-05-19 NOTE — Telephone Encounter (Signed)
Jory Ee NP notified of NA-120.  Order received from S. [place] NP for patient to receive one liter of NS over two hours today.

## 2019-05-20 ENCOUNTER — Ambulatory Visit (HOSPITAL_COMMUNITY)
Admission: RE | Admit: 2019-05-20 | Discharge: 2019-05-20 | Disposition: A | Discharge status: Home / Self Care | Payer: 59 | Source: Ambulatory Visit | Attending: Hematology & Oncology | Admitting: Hematology & Oncology

## 2019-05-20 DIAGNOSIS — C3491 Malignant neoplasm of unspecified part of right bronchus or lung: Secondary | ICD-10-CM | POA: Insufficient documentation

## 2019-05-20 DIAGNOSIS — C7931 Secondary malignant neoplasm of brain: Secondary | ICD-10-CM | POA: Diagnosis not present

## 2019-05-20 DIAGNOSIS — C3490 Malignant neoplasm of unspecified part of unspecified bronchus or lung: Secondary | ICD-10-CM | POA: Diagnosis not present

## 2019-05-20 MED ORDER — GADOBUTROL 1 MMOL/ML IV SOLN
10.0000 mL | Freq: Once | INTRAVENOUS | Status: AC | PRN
  Administered 2019-05-20: 9 mL via INTRAVENOUS

## 2019-05-22 ENCOUNTER — Inpatient Hospital Stay: Payer: 59

## 2019-05-22 ENCOUNTER — Telehealth: Payer: Self-pay | Admitting: *Deleted

## 2019-05-22 ENCOUNTER — Other Ambulatory Visit: Payer: Self-pay | Admitting: Family

## 2019-05-22 ENCOUNTER — Other Ambulatory Visit: Payer: Self-pay

## 2019-05-22 DIAGNOSIS — F17210 Nicotine dependence, cigarettes, uncomplicated: Secondary | ICD-10-CM | POA: Diagnosis not present

## 2019-05-22 DIAGNOSIS — E222 Syndrome of inappropriate secretion of antidiuretic hormone: Secondary | ICD-10-CM

## 2019-05-22 DIAGNOSIS — C3491 Malignant neoplasm of unspecified part of right bronchus or lung: Secondary | ICD-10-CM | POA: Diagnosis not present

## 2019-05-22 DIAGNOSIS — Z5111 Encounter for antineoplastic chemotherapy: Secondary | ICD-10-CM | POA: Diagnosis not present

## 2019-05-22 DIAGNOSIS — E871 Hypo-osmolality and hyponatremia: Secondary | ICD-10-CM | POA: Diagnosis not present

## 2019-05-22 DIAGNOSIS — Z23 Encounter for immunization: Secondary | ICD-10-CM | POA: Diagnosis not present

## 2019-05-22 DIAGNOSIS — T63461D Toxic effect of venom of wasps, accidental (unintentional), subsequent encounter: Secondary | ICD-10-CM | POA: Diagnosis not present

## 2019-05-22 DIAGNOSIS — Z85528 Personal history of other malignant neoplasm of kidney: Secondary | ICD-10-CM | POA: Diagnosis not present

## 2019-05-22 DIAGNOSIS — E860 Dehydration: Secondary | ICD-10-CM | POA: Diagnosis not present

## 2019-05-22 DIAGNOSIS — Z8546 Personal history of malignant neoplasm of prostate: Secondary | ICD-10-CM | POA: Diagnosis not present

## 2019-05-22 DIAGNOSIS — R05 Cough: Secondary | ICD-10-CM | POA: Diagnosis not present

## 2019-05-22 DIAGNOSIS — T63451D Toxic effect of venom of hornets, accidental (unintentional), subsequent encounter: Secondary | ICD-10-CM | POA: Diagnosis not present

## 2019-05-22 LAB — COMPREHENSIVE METABOLIC PANEL
ALT: 26 U/L (ref 0–44)
AST: 16 U/L (ref 15–41)
Albumin: 3.5 g/dL (ref 3.5–5.0)
Alkaline Phosphatase: 80 U/L (ref 38–126)
Anion gap: 6 (ref 5–15)
BUN: 9 mg/dL (ref 8–23)
CO2: 24 mmol/L (ref 22–32)
Calcium: 8.8 mg/dL — ABNORMAL LOW (ref 8.9–10.3)
Chloride: 88 mmol/L — ABNORMAL LOW (ref 98–111)
Creatinine, Ser: 0.82 mg/dL (ref 0.61–1.24)
GFR calc Af Amer: >60 mL/min (ref >60)
GFR calc non Af Amer: >60 mL/min (ref >60)
Glucose, Bld: 114 mg/dL — ABNORMAL HIGH (ref 70–99)
Potassium: 3.7 mmol/L (ref 3.5–5.1)
Sodium: 118 mmol/L — CL (ref 135–145)
Total Bilirubin: 0.4 mg/dL (ref 0.3–1.2)
Total Protein: 5.2 g/dL — ABNORMAL LOW (ref 6.5–8.1)

## 2019-05-22 LAB — CBC WITH DIFFERENTIAL/PLATELET
Abs Immature Granulocytes: 0.01 10*3/uL (ref 0.00–0.07)
Basophils Absolute: 0 10*3/uL (ref 0.0–0.1)
Basophils Relative: 0 %
Eosinophils Absolute: 0 10*3/uL (ref 0.0–0.5)
Eosinophils Relative: 0 %
HCT: 27 % — ABNORMAL LOW (ref 39.0–52.0)
Hemoglobin: 9.8 g/dL — ABNORMAL LOW (ref 13.0–17.0)
Immature Granulocytes: 1 %
Lymphocytes Relative: 20 %
Lymphs Abs: 0.2 10*3/uL — ABNORMAL LOW (ref 0.7–4.0)
MCH: 30.3 pg (ref 26.0–34.0)
MCHC: 36.3 g/dL — ABNORMAL HIGH (ref 30.0–36.0)
MCV: 83.6 fL (ref 80.0–100.0)
Monocytes Absolute: 0.3 10*3/uL (ref 0.1–1.0)
Monocytes Relative: 26 %
Neutro Abs: 0.5 10*3/uL — ABNORMAL LOW (ref 1.7–7.7)
Neutrophils Relative %: 53 %
Platelets: 148 10*3/uL — ABNORMAL LOW (ref 150–400)
RBC: 3.23 MIL/uL — ABNORMAL LOW (ref 4.22–5.81)
RDW: 14.2 % (ref 11.5–15.5)
WBC: 1 10*3/uL — ABNORMAL LOW (ref 4.0–10.5)
nRBC: 0 % (ref 0.0–0.2)

## 2019-05-22 MED ORDER — SODIUM CHLORIDE 1 G PO TABS: 1.0000 g | ORAL_TABLET | Freq: Two times a day (BID) | ORAL | 0 refills | Status: DC

## 2019-05-22 MED ORDER — SODIUM CHLORIDE 0.9% FLUSH
10.0000 mL | INTRAVENOUS | Status: DC | PRN
  Administered 2019-05-22: 10 mL via INTRAVENOUS
  Filled 2019-05-22: qty 10

## 2019-05-22 MED ORDER — SODIUM CHLORIDE 0.9 % IV SOLN
Freq: Once | INTRAVENOUS | Status: AC
  Administered 2019-05-22: 12:00:00 via INTRAVENOUS
  Filled 2019-05-22: qty 250

## 2019-05-22 MED ORDER — HEPARIN SOD (PORK) LOCK FLUSH 100 UNIT/ML IV SOLN
500.0000 [IU] | Freq: Once | INTRAVENOUS | Status: AC
  Administered 2019-05-22: 500 [IU] via INTRAVENOUS
  Filled 2019-05-22: qty 5

## 2019-05-22 MED ORDER — DEMECLOCYCLINE HCL 150 MG PO TABS: 300.0000 mg | ORAL_TABLET | Freq: Two times a day (BID) | ORAL | 2 refills | Status: DC

## 2019-05-22 MED FILL — DEMECLOCYCLINE 150 MG TABLE: 150 | 30 days supply | Qty: 120 | Fill #0

## 2019-05-22 MED FILL — SODIUM CHLORIDE 1 GM TABLET: 1 | 30 days supply | Qty: 60 | Fill #0

## 2019-05-22 NOTE — Telephone Encounter (Signed)
Taylor Ee NP notified of NA-118.  Order received for pt to increase dose of Declomycin to 300 mg twice a day, to start fluid restriction 1500 ml daily and to start Sodium tablets 1 gram twice a day with breakfast and dinner.  Teach back done.

## 2019-05-22 NOTE — Progress Notes (Signed)
I spoke with Dr. Maylon Peppers and patient will receive fluids today as planned. We will send him home on fluid restriction of 1.5 liters per day and sodium chloride 1 gm tablet by mouth BID. We will also increase his demeclocycline to 300 mg PO BID. Patient verbalized understanding and agreement with the plan.

## 2019-05-22 NOTE — Patient Instructions (Signed)
Dehydration, Adult  Dehydration is when there is not enough fluid or water in your body. This happens when you lose more fluids than you take in. Dehydration can range from mild to very bad. It should be treated right away to keep it from getting very bad. Symptoms of mild dehydration may include:  Thirst.  Dry lips.  Slightly dry mouth.  Dry, warm skin.  Dizziness. Symptoms of moderate dehydration may include:  Very dry mouth.  Muscle cramps.  Dark pee (urine). Pee may be the color of tea.  Your body making less pee.  Your eyes making fewer tears.  Heartbeat that is uneven or faster than normal (palpitations).  Headache.  Light-headedness, especially when you stand up from sitting.  Fainting (syncope). Symptoms of very bad dehydration may include:  Changes in skin, such as: ? Cold and clammy skin. ? Blotchy (mottled) or pale skin. ? Skin that does not quickly return to normal after being lightly pinched and let go (poor skin turgor).  Changes in body fluids, such as: ? Feeling very thirsty. ? Your eyes making fewer tears. ? Not sweating when body temperature is high, such as in hot weather. ? Your body making very little pee.  Changes in vital signs, such as: ? Weak pulse. ? Pulse that is more than 100 beats a minute when you are sitting still. ? Fast breathing. ? Low blood pressure.  Other changes, such as: ? Sunken eyes. ? Cold hands and feet. ? Confusion. ? Lack of energy (lethargy). ? Trouble waking up from sleep. ? Short-term weight loss. ? Unconsciousness. Follow these instructions at home:   If told by your doctor, drink an ORS: ? Make an ORS by using instructions on the package. ? Start by drinking small amounts, about  cup (120 mL) every 5-10 minutes. ? Slowly drink more until you have had the amount that your doctor said to have.  Drink enough clear fluid to keep your pee clear or pale yellow. If you were told to drink an ORS, finish the  ORS first, then start slowly drinking clear fluids. Drink fluids such as: ? Water. Do not drink only water by itself. Doing that can make the salt (sodium) level in your body get too low (hyponatremia). ? Ice chips. ? Fruit juice that you have added water to (diluted). ? Low-calorie sports drinks.  Avoid: ? Alcohol. ? Drinks that have a lot of sugar. These include high-calorie sports drinks, fruit juice that does not have water added, and soda. ? Caffeine. ? Foods that are greasy or have a lot of fat or sugar.  Take over-the-counter and prescription medicines only as told by your doctor.  Do not take salt tablets. Doing that can make the salt level in your body get too high (hypernatremia).  Eat foods that have minerals (electrolytes). Examples include bananas, oranges, potatoes, tomatoes, and spinach.  Keep all follow-up visits as told by your doctor. This is important. Contact a doctor if:  You have belly (abdominal) pain that: ? Gets worse. ? Stays in one area (localizes).  You have a rash.  You have a stiff neck.  You get angry or annoyed more easily than normal (irritability).  You are more sleepy than normal.  You have a harder time waking up than normal.  You feel: ? Weak. ? Dizzy. ? Very thirsty.  You have peed (urinated) only a small amount of very dark pee during 6-8 hours. Get help right away if:  You have   symptoms of very bad dehydration.  You cannot drink fluids without throwing up (vomiting).  Your symptoms get worse with treatment.  You have a fever.  You have a very bad headache.  You are throwing up or having watery poop (diarrhea) and it: ? Gets worse. ? Does not go away.  You have blood or something green (bile) in your throw-up.  You have blood in your poop (stool). This may cause poop to look black and tarry.  You have not peed in 6-8 hours.  You pass out (faint).  Your heart rate when you are sitting still is more than 100 beats a  minute.  You have trouble breathing. This information is not intended to replace advice given to you by your health care provider. Make sure you discuss any questions you have with your health care provider. Document Released: 05/12/2009 Document Revised: 06/28/2017 Document Reviewed: 09/09/2015 Elsevier Patient Education  2020 Elsevier Inc.  

## 2019-05-26 ENCOUNTER — Other Ambulatory Visit: Payer: Self-pay | Admitting: Family

## 2019-05-27 ENCOUNTER — Encounter: Payer: Self-pay | Admitting: Hematology & Oncology

## 2019-05-27 ENCOUNTER — Inpatient Hospital Stay: Payer: 59

## 2019-05-27 ENCOUNTER — Other Ambulatory Visit: Payer: Self-pay

## 2019-05-27 ENCOUNTER — Inpatient Hospital Stay (HOSPITAL_BASED_OUTPATIENT_CLINIC_OR_DEPARTMENT_OTHER): Payer: 59 | Admitting: Hematology & Oncology

## 2019-05-27 DIAGNOSIS — R05 Cough: Secondary | ICD-10-CM

## 2019-05-27 DIAGNOSIS — E871 Hypo-osmolality and hyponatremia: Secondary | ICD-10-CM | POA: Diagnosis not present

## 2019-05-27 DIAGNOSIS — C3491 Malignant neoplasm of unspecified part of right bronchus or lung: Secondary | ICD-10-CM

## 2019-05-27 DIAGNOSIS — R059 Cough, unspecified: Secondary | ICD-10-CM

## 2019-05-27 DIAGNOSIS — F17210 Nicotine dependence, cigarettes, uncomplicated: Secondary | ICD-10-CM | POA: Diagnosis not present

## 2019-05-27 DIAGNOSIS — Z85528 Personal history of other malignant neoplasm of kidney: Secondary | ICD-10-CM | POA: Diagnosis not present

## 2019-05-27 DIAGNOSIS — Z8546 Personal history of malignant neoplasm of prostate: Secondary | ICD-10-CM | POA: Diagnosis not present

## 2019-05-27 DIAGNOSIS — Z5111 Encounter for antineoplastic chemotherapy: Secondary | ICD-10-CM | POA: Diagnosis not present

## 2019-05-27 DIAGNOSIS — E860 Dehydration: Secondary | ICD-10-CM | POA: Diagnosis not present

## 2019-05-27 DIAGNOSIS — Z23 Encounter for immunization: Secondary | ICD-10-CM | POA: Diagnosis not present

## 2019-05-27 LAB — CBC WITH DIFFERENTIAL (CANCER CENTER ONLY)
Abs Immature Granulocytes: 0.01 10*3/uL (ref 0.00–0.07)
Basophils Absolute: 0 10*3/uL (ref 0.0–0.1)
Basophils Relative: 0 %
Eosinophils Absolute: 0 10*3/uL (ref 0.0–0.5)
Eosinophils Relative: 0 %
HCT: 29.4 % — ABNORMAL LOW (ref 39.0–52.0)
Hemoglobin: 10.2 g/dL — ABNORMAL LOW (ref 13.0–17.0)
Immature Granulocytes: 0 %
Lymphocytes Relative: 10 %
Lymphs Abs: 0.2 10*3/uL — ABNORMAL LOW (ref 0.7–4.0)
MCH: 30.3 pg (ref 26.0–34.0)
MCHC: 34.7 g/dL (ref 30.0–36.0)
MCV: 87.2 fL (ref 80.0–100.0)
Monocytes Absolute: 0.5 10*3/uL (ref 0.1–1.0)
Monocytes Relative: 22 %
Neutro Abs: 1.6 10*3/uL — ABNORMAL LOW (ref 1.7–7.7)
Neutrophils Relative %: 68 %
Platelet Count: 248 10*3/uL (ref 150–400)
RBC: 3.37 MIL/uL — ABNORMAL LOW (ref 4.22–5.81)
RDW: 15.1 % (ref 11.5–15.5)
WBC Count: 2.4 10*3/uL — ABNORMAL LOW (ref 4.0–10.5)
nRBC: 0 % (ref 0.0–0.2)

## 2019-05-27 LAB — CMP (CANCER CENTER ONLY)
ALT: 29 U/L (ref 0–44)
AST: 18 U/L (ref 15–41)
Albumin: 3.5 g/dL (ref 3.5–5.0)
Alkaline Phosphatase: 88 U/L (ref 38–126)
Anion gap: 7 (ref 5–15)
BUN: 11 mg/dL (ref 8–23)
CO2: 25 mmol/L (ref 22–32)
Calcium: 9 mg/dL (ref 8.9–10.3)
Chloride: 101 mmol/L (ref 98–111)
Creatinine: 1 mg/dL (ref 0.61–1.24)
GFR, Est AFR Am: >60 mL/min (ref >60)
GFR, Estimated: >60 mL/min (ref >60)
Glucose, Bld: 117 mg/dL — ABNORMAL HIGH (ref 70–99)
Potassium: 3.7 mmol/L (ref 3.5–5.1)
Sodium: 133 mmol/L — ABNORMAL LOW (ref 135–145)
Total Bilirubin: 0.3 mg/dL (ref 0.3–1.2)
Total Protein: 5.2 g/dL — ABNORMAL LOW (ref 6.5–8.1)

## 2019-05-27 LAB — LACTATE DEHYDROGENASE: LDH: 110 U/L (ref 98–192)

## 2019-05-27 MED ORDER — SODIUM CHLORIDE 0.9 % IV SOLN
Freq: Once | INTRAVENOUS | Status: AC
  Administered 2019-05-27: 11:00:00 via INTRAVENOUS
  Filled 2019-05-27: qty 250

## 2019-05-27 MED ORDER — PALONOSETRON HCL INJECTION 0.25 MG/5ML
0.2500 mg | Freq: Once | INTRAVENOUS | Status: AC
  Administered 2019-05-27: 0.25 mg via INTRAVENOUS

## 2019-05-27 MED ORDER — SODIUM CHLORIDE 0.9% FLUSH
10.0000 mL | INTRAVENOUS | Status: DC | PRN
  Administered 2019-05-27: 10 mL
  Filled 2019-05-27: qty 10

## 2019-05-27 MED ORDER — HYDROCODONE-HOMATROPINE 5-1.5 MG/5ML PO SYRP: 5.0000 mL | ORAL_SOLUTION | ORAL | 0 refills | Status: DC | PRN

## 2019-05-27 MED ORDER — DEXAMETHASONE SODIUM PHOSPHATE 10 MG/ML IJ SOLN
10.0000 mg | Freq: Once | INTRAMUSCULAR | Status: AC
  Administered 2019-05-27: 10 mg via INTRAVENOUS

## 2019-05-27 MED ORDER — SODIUM CHLORIDE 0.9% FLUSH
3.0000 mL | INTRAVENOUS | Status: DC | PRN
  Filled 2019-05-27: qty 10

## 2019-05-27 MED ORDER — HEPARIN SOD (PORK) LOCK FLUSH 100 UNIT/ML IV SOLN
500.0000 [IU] | Freq: Once | INTRAVENOUS | Status: AC | PRN
  Administered 2019-05-27: 500 [IU]
  Filled 2019-05-27: qty 5

## 2019-05-27 MED ORDER — SODIUM CHLORIDE 0.9 % IV SOLN
2.5600 mg/m2 | Freq: Once | INTRAVENOUS | Status: AC
  Administered 2019-05-27: 5.35 mg via INTRAVENOUS
  Filled 2019-05-27: qty 10.7

## 2019-05-27 MED ORDER — PALONOSETRON HCL INJECTION 0.25 MG/5ML
INTRAVENOUS | Status: AC
  Filled 2019-05-27: qty 5

## 2019-05-27 MED ORDER — DEXAMETHASONE SODIUM PHOSPHATE 10 MG/ML IJ SOLN
INTRAMUSCULAR | Status: AC
  Filled 2019-05-27: qty 1

## 2019-05-27 MED FILL — COMBIVENT RESPIMAT INHAL SP: 20-100 | 30 days supply | Qty: 4 | Fill #2

## 2019-05-27 MED FILL — HYDROCODONE-HOMATROPINE SYR: 5-1.5 | 13 days supply | Qty: 250 | Fill #0

## 2019-05-27 NOTE — Patient Instructions (Signed)
[facility] Discharge Instructions for Patients Receiving Chemotherapy  Today you received the following chemotherapy agents Zepzelca.  To help prevent nausea and vomiting after your treatment, we encourage you to take your nausea medication as indicated by your MD.   If you develop nausea and vomiting that is not controlled by your nausea medication, call the clinic.   BELOW ARE SYMPTOMS THAT SHOULD BE REPORTED IMMEDIATELY:  *FEVER GREATER THAN 100.5 F  *CHILLS WITH OR WITHOUT FEVER  NAUSEA AND VOMITING THAT IS NOT CONTROLLED WITH YOUR NAUSEA MEDICATION  *UNUSUAL SHORTNESS OF BREATH  *UNUSUAL BRUISING OR BLEEDING  TENDERNESS IN MOUTH AND THROAT WITH OR WITHOUT PRESENCE OF ULCERS  *URINARY PROBLEMS  *BOWEL PROBLEMS  UNUSUAL RASH Items with * indicate a potential emergency and should be followed up as soon as possible.  Feel free to call the clinic should you have any questions or concerns. The clinic phone number is [phone].  Please show the [place] at check-in to the Emergency Department and triage nurse.

## 2019-05-27 NOTE — Progress Notes (Signed)
Hematology and Oncology Follow Up Visit  Taylor Elliott [id number] [birth year] 68 y.o. 05/27/2019   Principle Diagnosis:   Extensive stage small cell lung cancer  SIADH secondary to small cell lung cancer  Current Therapy:           Carboplatinum/etoposide/Tecentriq-cycle #6  Atezolizumab 1200 mg IV q 3 week -- maintenance -  Start 01/21/2019  XRT for CNS mets -- completed on 03/27/2019  Lurbinectedin -- s/p cycle #1 -- started on 05/07/2019     Interim History:  Taylor Elliott is back for follow-up.  He really had a tough time with the first cycle of chemotherapy.  I would have to reduce his dose by 20%.  I think he will tolerate this better.  He had a lot of problems with diarrhea.  His white cell count went down a little bit too low.  It looks like the SIADH had come back.  He is on the demeclocycline for this.  His sodium today is 133.  I think we are going to have to give him some IV fluids on a schedule so that he does not get dehydrated.    The big news is that he and his wife will have her 40th wedding anniversary on November 22.  They are going to renew their vials.  I want to make sure that he is well enough to do this.  He is having hard time hearing.  When I looked into his ear canals, it looks like there was some kind of blockage in the left ear canal.  I am not sure what this is.  I am going to have to see if we can get ENT to take a look at him.  He has had no bleeding.  He has had no rashes.  There has been no headache.  His retinal tear is improving.  Overall, his performance status is ECOG 1.   Medications:  Current Outpatient Medications:    allopurinol (ZYLOPRIM) 100 MG tablet, TAKE 1 TABLET BY MOUTH 2 TIMES DAILY. (Patient taking differently: Take 100 mg by mouth daily. ), Disp: 180 tablet, Rfl: 3   amLODipine (NORVASC) 10 MG tablet, TAKE 1 TABLET BY MOUTH EVERY MORNING, Disp: 90 tablet, Rfl: 1   aspirin EC 81 MG tablet, Take 81 mg by mouth daily.,  Disp: , Rfl:    brimonidine (ALPHAGAN) 0.2 % ophthalmic solution, Place 1 drop into the right eye 2 (two) times daily., Disp: 5 mL, Rfl: 12   calcium carbonate (TUMS - DOSED IN MG ELEMENTAL CALCIUM) 500 MG chewable tablet, Chew 2 tablets by mouth daily as needed for indigestion or heartburn. , Disp: , Rfl:    demeclocycline (DECLOMYCIN) 150 MG tablet, Take 2 tablets (300 mg total) by mouth 2 (two) times daily., Disp: 120 tablet, Rfl: 2   dexamethasone (DECADRON) 4 MG tablet, Take 2 tablets (8 mg total) by mouth daily. Start the day after chemotherapy for 2 days. Take with food., Disp: 30 tablet, Rfl: 1   fenofibrate 160 MG tablet, TAKE 1 TABLET (160 MG TOTAL) BY MOUTH DAILY., Disp: 90 tablet, Rfl: 1   HYDROcodone-acetaminophen (NORCO/VICODIN) 5-325 MG tablet, Take 1 tablet by mouth every 6 (six) hours as needed for moderate pain., Disp: 60 tablet, Rfl: 0   HYDROcodone-homatropine (HYCODAN) 5-1.5 MG/5ML syrup, Take 5 mLs by mouth every 4 (four) hours as needed for cough., Disp: 250 mL, Rfl: 0   lactulose (CHRONULAC) 10 GM/15ML solution, Take 15 mLs (10 g total) by mouth 2 (  two) times daily as needed for mild constipation., Disp: 473 mL, Rfl: 1   lidocaine-prilocaine (EMLA) cream, Apply 1 application topically as needed (port access). , Disp: , Rfl:    lisinopril (ZESTRIL) 20 MG tablet, TAKE 1 TABLET BY MOUTH ONCE DAILY, Disp: 90 tablet, Rfl: 1   LORazepam (ATIVAN) 0.5 MG tablet, Take 1 tablet (0.5 mg total) by mouth every 6 (six) hours as needed (Nausea or vomiting)., Disp: 30 tablet, Rfl: 0   LORazepam (ATIVAN) 1 MG tablet, Take 1 tablet (1 mg total) by mouth every 6 (six) hours as needed for anxiety., Disp: 60 tablet, Rfl: 0   Multiple Vitamin (MULTIVITAMIN) tablet, Take 1 tablet by mouth daily., Disp: , Rfl:    ondansetron (ZOFRAN) 8 MG tablet, Take 1 tablet (8 mg total) by mouth 2 (two) times daily as needed for refractory nausea / vomiting. Start on day 3 after chemotherapy., Disp: 30  tablet, Rfl: 1   prochlorperazine (COMPAZINE) 10 MG tablet, Take 1 tablet (10 mg total) by mouth every 6 (six) hours as needed (Nausea or vomiting)., Disp: 30 tablet, Rfl: 1   pyridoxine (B-6) 200 MG tablet, Take 200 mg by mouth daily., Disp: , Rfl:    sodium chloride 1 g tablet, Take 1 tablet (1 g total) by mouth 2 (two) times daily with a meal., Disp: 60 tablet, Rfl: 0   solifenacin (VESICARE) 10 MG tablet, Take 10 mg by mouth every evening., Disp: , Rfl:    temazepam (RESTORIL) 30 MG capsule, Take 1 capsule (30 mg total) by mouth at bedtime as needed for sleep., Disp: 30 capsule, Rfl: 2   UNABLE TO FIND, Med Name: Allergy shots., Disp: , Rfl:    bacitracin-polymyxin b (POLYSPORIN) ophthalmic ointment, Place 1 application into the right eye 3 (three) times daily. apply to eye every 12 hours while awake, Disp: 3.5 g, Rfl: 0   brimonidine (ALPHAGAN) 0.15 % ophthalmic solution, , Disp: , Rfl:    EPINEPHrine 0.3 mg/0.3 mL IJ SOAJ injection, Inject 0.3 mg into the muscle once., Disp: , Rfl:    gatifloxacin (ZYMAXID) 0.5 % SOLN, Place 1 drop into the right eye 4 (four) times daily., Disp:  , Rfl:    lidocaine-prilocaine (EMLA) cream, Apply to affected area once, Disp: 30 g, Rfl: 3   prednisoLONE acetate (PRED FORTE) 1 % ophthalmic suspension, Place 1 drop into the right eye 4 (four) times daily. (Patient taking differently: Place 1 drop into the right eye 2 (two) times daily. ), Disp: 5 mL, Rfl: 0   predniSONE (DELTASONE) 20 MG tablet, Take 5 mg by mouth daily. , Disp: , Rfl:    rosuvastatin (CRESTOR) 10 MG tablet, TAKE 1 TABLET BY MOUTH ONCE DAILY (Patient not taking: Reported on 05/27/2019), Disp: 90 tablet, Rfl: 1  Allergies:  Allergies  Allergen Reactions   Bee Venom Anaphylaxis   Clindamycin/Lincomycin Hives    Past Medical History, Surgical history, Social history, and Family History were reviewed and updated.  Review of Systems: Review of Systems  Constitutional:  Negative.   HENT:  Negative.   Eyes: Negative.   Respiratory: Positive for cough.   Cardiovascular: Negative.   Gastrointestinal: Negative.   Endocrine: Negative.   Genitourinary: Negative.    Musculoskeletal: Positive for back pain, myalgias and neck pain.  Skin: Positive for itching and rash.  Neurological: Negative.   Hematological: Negative.   Psychiatric/Behavioral: Negative.     Physical Exam:  weight is 196 lb (88.9 kg). His oral temperature is 97.7  F (36.5 C). His blood pressure is 138/56 (abnormal) and his pulse is 89. His respiration is 18 and oxygen saturation is 100%.   Wt Readings from Last 3 Encounters:  05/27/19 196 lb (88.9 kg)  05/07/19 203 lb (92.1 kg)  05/06/19 203 lb (92.1 kg)    Physical Exam Vitals signs reviewed.  HENT:     Head: Normocephalic and atraumatic.  Eyes:     Pupils: Pupils are equal, round, and reactive to light.  Neck:     Musculoskeletal: Normal range of motion.  Cardiovascular:     Rate and Rhythm: Normal rate and regular rhythm.     Heart sounds: Normal heart sounds.  Pulmonary:     Effort: Pulmonary effort is normal.     Breath sounds: Normal breath sounds.  Abdominal:     General: Bowel sounds are normal.     Palpations: Abdomen is soft.     Comments: Abdominal exam shows a slightly obese abdomen.  He does have an umbilical hernia.  I really cannot palpate his liver at this point.  There is no fluid wave.  There is no inguinal adenopathy.  There is no splenomegaly.    Musculoskeletal: Normal range of motion.        General: No tenderness or deformity.  Lymphadenopathy:     Cervical: No cervical adenopathy.  Skin:    General: Skin is warm and dry.     Findings: No erythema or rash.  Neurological:     Mental Status: He is alert and oriented to person, place, and time.  Psychiatric:        Behavior: Behavior normal.        Thought Content: Thought content normal.        Judgment: Judgment normal.      Lab Results    Component Value Date   WBC 2.4 (L) 05/27/2019   HGB 10.2 (L) 05/27/2019   HCT 29.4 (L) 05/27/2019   MCV 87.2 05/27/2019   PLT 248 05/27/2019     Chemistry      Component Value Date/Time   NA 133 (L) 05/27/2019 0919   NA 135 (L) 07/12/2015 0813   K 3.7 05/27/2019 0919   K 4.5 07/12/2015 0813   CL 101 05/27/2019 0919   CO2 25 05/27/2019 0919   CO2 24 07/12/2015 0813   BUN 11 05/27/2019 0919   BUN 15.7 07/12/2015 0813   CREATININE 1.00 05/27/2019 0919   CREATININE 1.6 (H) 07/12/2015 0813      Component Value Date/Time   CALCIUM 9.0 05/27/2019 0919   CALCIUM 10.1 07/12/2015 0813   ALKPHOS 88 05/27/2019 0919   ALKPHOS 115 07/12/2015 0813   AST 18 05/27/2019 0919   AST 22 07/12/2015 0813   ALT 29 05/27/2019 0919   ALT 24 07/12/2015 0813   BILITOT 0.3 05/27/2019 0919   BILITOT 0.46 07/12/2015 0813       Impression and Plan: Mr. Lindroth is a 68 year old white male.  He has a past history of renal cell carcinoma and also prostate cancer.  Now, he has a third malignancy.  This is metastatic small cell lung cancer.  I will proceed with his second cycle of treatment.  Hopefully, we will see her response.  I will do another PET scan after this second cycle.  Again, we will make a referral to ENT.  We will see what they can do to help with his hearing and try to open up his ear canals.  I think  that the SIADH is improving.  Again, he gets dehydrated very quickly.  I will plan to get him back in 3 weeks.  Hopefully we will have a good news for him so that he can enjoy his wedding bow renewal ceremony on 06/21/2019.  I spent about 30 minutes with him today.  His wife was on the cell phone.  I went over his lab work.  I went over my recommendations and treatment plan.   Volanda Napoleon, MD 10/28/202010:11 AM

## 2019-05-29 ENCOUNTER — Inpatient Hospital Stay: Payer: 59

## 2019-05-29 ENCOUNTER — Telehealth: Payer: Self-pay | Admitting: *Deleted

## 2019-05-29 ENCOUNTER — Other Ambulatory Visit: Payer: Self-pay | Admitting: *Deleted

## 2019-05-29 DIAGNOSIS — Z5111 Encounter for antineoplastic chemotherapy: Secondary | ICD-10-CM | POA: Diagnosis not present

## 2019-05-29 DIAGNOSIS — Z85528 Personal history of other malignant neoplasm of kidney: Secondary | ICD-10-CM | POA: Diagnosis not present

## 2019-05-29 DIAGNOSIS — L03119 Cellulitis of unspecified part of limb: Secondary | ICD-10-CM

## 2019-05-29 DIAGNOSIS — F17210 Nicotine dependence, cigarettes, uncomplicated: Secondary | ICD-10-CM | POA: Diagnosis not present

## 2019-05-29 DIAGNOSIS — C3491 Malignant neoplasm of unspecified part of right bronchus or lung: Secondary | ICD-10-CM

## 2019-05-29 DIAGNOSIS — E871 Hypo-osmolality and hyponatremia: Secondary | ICD-10-CM | POA: Diagnosis not present

## 2019-05-29 DIAGNOSIS — E860 Dehydration: Secondary | ICD-10-CM | POA: Diagnosis not present

## 2019-05-29 DIAGNOSIS — Z23 Encounter for immunization: Secondary | ICD-10-CM | POA: Diagnosis not present

## 2019-05-29 DIAGNOSIS — R05 Cough: Secondary | ICD-10-CM | POA: Diagnosis not present

## 2019-05-29 DIAGNOSIS — Z8546 Personal history of malignant neoplasm of prostate: Secondary | ICD-10-CM | POA: Diagnosis not present

## 2019-05-29 LAB — CMP (CANCER CENTER ONLY)
ALT: 450 U/L (ref 0–44)
AST: 186 U/L (ref 15–41)
Albumin: 3.5 g/dL (ref 3.5–5.0)
Alkaline Phosphatase: 93 U/L (ref 38–126)
Anion gap: 9 (ref 5–15)
BUN: 23 mg/dL (ref 8–23)
CO2: 24 mmol/L (ref 22–32)
Calcium: 9.2 mg/dL (ref 8.9–10.3)
Chloride: 106 mmol/L (ref 98–111)
Creatinine: 1.35 mg/dL — ABNORMAL HIGH (ref 0.61–1.24)
GFR, Est AFR Am: >60 mL/min (ref >60)
GFR, Estimated: 54 mL/min — ABNORMAL LOW (ref >60)
Glucose, Bld: 121 mg/dL — ABNORMAL HIGH (ref 70–99)
Potassium: 3.6 mmol/L (ref 3.5–5.1)
Sodium: 139 mmol/L (ref 135–145)
Total Bilirubin: 0.3 mg/dL (ref 0.3–1.2)
Total Protein: 5.3 g/dL — ABNORMAL LOW (ref 6.5–8.1)

## 2019-05-29 LAB — CBC WITH DIFFERENTIAL (CANCER CENTER ONLY)
Abs Immature Granulocytes: 0.03 10*3/uL (ref 0.00–0.07)
Basophils Absolute: 0 10*3/uL (ref 0.0–0.1)
Basophils Relative: 0 %
Eosinophils Absolute: 0 10*3/uL (ref 0.0–0.5)
Eosinophils Relative: 0 %
HCT: 29.4 % — ABNORMAL LOW (ref 39.0–52.0)
Hemoglobin: 9.9 g/dL — ABNORMAL LOW (ref 13.0–17.0)
Immature Granulocytes: 1 %
Lymphocytes Relative: 9 %
Lymphs Abs: 0.3 10*3/uL — ABNORMAL LOW (ref 0.7–4.0)
MCH: 29.8 pg (ref 26.0–34.0)
MCHC: 33.7 g/dL (ref 30.0–36.0)
MCV: 88.6 fL (ref 80.0–100.0)
Monocytes Absolute: 0.4 10*3/uL (ref 0.1–1.0)
Monocytes Relative: 11 %
Neutro Abs: 2.9 10*3/uL (ref 1.7–7.7)
Neutrophils Relative %: 79 %
Platelet Count: 248 10*3/uL (ref 150–400)
RBC: 3.32 MIL/uL — ABNORMAL LOW (ref 4.22–5.81)
RDW: 15.4 % (ref 11.5–15.5)
WBC Count: 3.6 10*3/uL — ABNORMAL LOW (ref 4.0–10.5)
nRBC: 0 % (ref 0.0–0.2)

## 2019-05-29 MED ORDER — HEPARIN SOD (PORK) LOCK FLUSH 100 UNIT/ML IV SOLN
500.0000 [IU] | Freq: Once | INTRAVENOUS | Status: AC | PRN
  Administered 2019-05-29: 500 [IU]
  Filled 2019-05-29: qty 5

## 2019-05-29 MED ORDER — SODIUM CHLORIDE 0.9% FLUSH
10.0000 mL | INTRAVENOUS | Status: DC | PRN
  Administered 2019-05-29: 10 mL
  Filled 2019-05-29: qty 10

## 2019-05-29 MED ORDER — SODIUM CHLORIDE 0.9 % IV SOLN
INTRAVENOUS | Status: DC
  Administered 2019-05-29: 08:00:00 via INTRAVENOUS
  Filled 2019-05-29: qty 250

## 2019-05-29 NOTE — Telephone Encounter (Signed)
Dr. Marin Olp notified of AST-186 and ALT-450.  Order received for patient to get 500 ml IV NS today and to return next Friday, 06/05/19 for labs and IVF's per Dr. Marin Olp.

## 2019-05-29 NOTE — Patient Instructions (Signed)
Hyponatremia Hyponatremia is when the amount of salt (sodium) in your blood is too low. When salt levels are low, your body may take in extra water. This can cause swelling throughout the body. The swelling often affects the brain. What are the causes? This condition may be caused by:  Certain medical problems or conditions.  Vomiting a lot.  Having watery poop (diarrhea) often.  Certain medicines or illegal drugs.  Not having enough water in the body (dehydration).  Drinking too much water.  Eating a diet that is low in salt.  Large burns on your body.  Too much sweating. What increases the risk? You are more likely to get this condition if you:  Have long-term (chronic) kidney disease.  Have heart failure.  Have a medical condition that causes you to have watery poop often.  Do very hard exercises.  Take medicines that affect the amount of salt is in your blood. What are the signs or symptoms? Symptoms of this condition include:  Headache.  Feeling like you may vomit (nausea).  Vomiting.  Being very tired (lethargic).  Muscle weakness and cramps.  Not wanting to eat as much as normal (loss of appetite).  Feeling weak or light-headed. Severe symptoms of this condition include:  Confusion.  Feeling restless (agitation).  Having a fast heart rate.  Passing out (fainting).  Seizures.  Coma. How is this treated? Treatment for this condition depends on the cause. Treatment may include:  Getting fluids through an IV tube that is put into one of your veins.  Taking medicines to fix the salt levels in your blood. If medicines are causing the problem, your medicines will need to be changed.  Limiting how much water or fluid you take in.  Monitoring in the hospital to watch your symptoms. Follow these instructions at home:   Take over-the-counter and prescription medicines only as told by your doctor. Many medicines can make this condition worse.  Talk with your doctor about any medicines that you are taking.  Eat and drink exactly as you are told by your doctor. ? Eat only the foods you are told to eat. ? Limit how much fluid you take.  Do not drink alcohol.  Keep all follow-up visits as told by your doctor. This is important. Contact a doctor if:  You feel more like you may vomit.  You feel more tired.  Your headache gets worse.  You feel more confused.  You feel weaker.  Your symptoms go away and then they come back.  You have trouble following the diet instructions. Get help right away if:  You have a seizure.  You pass out.  You keep having watery poop.  You keep vomiting. Summary  Hyponatremia is when the amount of salt in your blood is too low.  When salt levels are low, you can have swelling throughout the body. The swelling mostly affects the brain.  Treatment depends on the cause. Treatment may include getting IV fluids, medicines, or not drinking as much fluid. This information is not intended to replace advice given to you by your health care provider. Make sure you discuss any questions you have with your health care provider. Document Released: 03/28/2011 Document Revised: 10/02/2018 Document Reviewed: 06/19/2018 Elsevier Patient Education  2020 [employer].

## 2019-06-01 ENCOUNTER — Ambulatory Visit
Admission: RE | Admit: 2019-06-01 | Discharge: 2019-06-01 | Disposition: A | Discharge status: Home / Self Care | Payer: Medicare Other | Source: Ambulatory Visit | Attending: Radiation Oncology | Admitting: Radiation Oncology

## 2019-06-02 ENCOUNTER — Other Ambulatory Visit: Payer: Self-pay

## 2019-06-02 ENCOUNTER — Encounter (INDEPENDENT_AMBULATORY_CARE_PROVIDER_SITE_OTHER): Payer: Self-pay | Admitting: Otolaryngology

## 2019-06-02 ENCOUNTER — Ambulatory Visit (INDEPENDENT_AMBULATORY_CARE_PROVIDER_SITE_OTHER): Payer: 59 | Admitting: Otolaryngology

## 2019-06-02 VITALS — Temp 97.8°F

## 2019-06-02 DIAGNOSIS — D164 Benign neoplasm of bones of skull and face: Secondary | ICD-10-CM | POA: Diagnosis not present

## 2019-06-02 DIAGNOSIS — H65111 Acute and subacute allergic otitis media (mucoid) (sanguinous) (serous), right ear: Secondary | ICD-10-CM

## 2019-06-02 DIAGNOSIS — H6123 Impacted cerumen, bilateral: Secondary | ICD-10-CM | POA: Diagnosis not present
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
## 2019-06-04 ENCOUNTER — Telehealth: Payer: Self-pay | Admitting: *Deleted

## 2019-06-04 NOTE — Telephone Encounter (Signed)
Call received from [place] with [facility] requesting Dr. Antonieta Pert last office visit note be faxed to [phone].  Office note faxed.

## 2019-06-05 ENCOUNTER — Inpatient Hospital Stay: Payer: 59 | Attending: Hematology & Oncology

## 2019-06-05 ENCOUNTER — Inpatient Hospital Stay: Payer: 59

## 2019-06-05 ENCOUNTER — Other Ambulatory Visit: Payer: Self-pay

## 2019-06-05 VITALS — BP 142/74 | HR 83 | Resp 17

## 2019-06-05 DIAGNOSIS — Z905 Acquired absence of kidney: Secondary | ICD-10-CM | POA: Diagnosis not present

## 2019-06-05 DIAGNOSIS — Z7982 Long term (current) use of aspirin: Secondary | ICD-10-CM | POA: Diagnosis not present

## 2019-06-05 DIAGNOSIS — Z8546 Personal history of malignant neoplasm of prostate: Secondary | ICD-10-CM | POA: Diagnosis not present

## 2019-06-05 DIAGNOSIS — Z79899 Other long term (current) drug therapy: Secondary | ICD-10-CM | POA: Diagnosis not present

## 2019-06-05 DIAGNOSIS — K5900 Constipation, unspecified: Secondary | ICD-10-CM | POA: Diagnosis not present

## 2019-06-05 DIAGNOSIS — E222 Syndrome of inappropriate secretion of antidiuretic hormone: Secondary | ICD-10-CM | POA: Insufficient documentation

## 2019-06-05 DIAGNOSIS — I959 Hypotension, unspecified: Secondary | ICD-10-CM | POA: Insufficient documentation

## 2019-06-05 DIAGNOSIS — C3491 Malignant neoplasm of unspecified part of right bronchus or lung: Secondary | ICD-10-CM | POA: Diagnosis not present

## 2019-06-05 DIAGNOSIS — L03119 Cellulitis of unspecified part of limb: Secondary | ICD-10-CM

## 2019-06-05 DIAGNOSIS — Z85528 Personal history of other malignant neoplasm of kidney: Secondary | ICD-10-CM | POA: Insufficient documentation

## 2019-06-05 LAB — CBC WITH DIFFERENTIAL (CANCER CENTER ONLY)
Abs Immature Granulocytes: 0.02 10*3/uL (ref 0.00–0.07)
Basophils Absolute: 0 10*3/uL (ref 0.0–0.1)
Basophils Relative: 1 %
Eosinophils Absolute: 0 10*3/uL (ref 0.0–0.5)
Eosinophils Relative: 0 %
HCT: 35.9 % — ABNORMAL LOW (ref 39.0–52.0)
Hemoglobin: 12 g/dL — ABNORMAL LOW (ref 13.0–17.0)
Immature Granulocytes: 1 %
Lymphocytes Relative: 17 %
Lymphs Abs: 0.5 10*3/uL — ABNORMAL LOW (ref 0.7–4.0)
MCH: 29.9 pg (ref 26.0–34.0)
MCHC: 33.4 g/dL (ref 30.0–36.0)
MCV: 89.5 fL (ref 80.0–100.0)
Monocytes Absolute: 0.3 10*3/uL (ref 0.1–1.0)
Monocytes Relative: 11 %
Neutro Abs: 2.1 10*3/uL (ref 1.7–7.7)
Neutrophils Relative %: 70 %
Platelet Count: 174 10*3/uL (ref 150–400)
RBC: 4.01 MIL/uL — ABNORMAL LOW (ref 4.22–5.81)
RDW: 14.7 % (ref 11.5–15.5)
WBC Count: 3.1 10*3/uL — ABNORMAL LOW (ref 4.0–10.5)
nRBC: 0 % (ref 0.0–0.2)

## 2019-06-05 LAB — CMP (CANCER CENTER ONLY)
ALT: 68 U/L — ABNORMAL HIGH (ref 0–44)
AST: 26 U/L (ref 15–41)
Albumin: 3.7 g/dL (ref 3.5–5.0)
Alkaline Phosphatase: 107 U/L (ref 38–126)
Anion gap: 9 (ref 5–15)
BUN: 28 mg/dL — ABNORMAL HIGH (ref 8–23)
CO2: 24 mmol/L (ref 22–32)
Calcium: 9 mg/dL (ref 8.9–10.3)
Chloride: 104 mmol/L (ref 98–111)
Creatinine: 1.95 mg/dL — ABNORMAL HIGH (ref 0.61–1.24)
GFR, Est AFR Am: 40 mL/min — ABNORMAL LOW
GFR, Estimated: 35 mL/min — ABNORMAL LOW
Glucose, Bld: 120 mg/dL — ABNORMAL HIGH (ref 70–99)
Potassium: 3.8 mmol/L (ref 3.5–5.1)
Sodium: 137 mmol/L (ref 135–145)
Total Bilirubin: 0.8 mg/dL (ref 0.3–1.2)
Total Protein: 5.8 g/dL — ABNORMAL LOW (ref 6.5–8.1)

## 2019-06-05 MED ORDER — HEPARIN SOD (PORK) LOCK FLUSH 100 UNIT/ML IV SOLN
500.0000 [IU] | Freq: Once | INTRAVENOUS | Status: AC | PRN
  Administered 2019-06-05: 500 [IU]
  Filled 2019-06-05: qty 5

## 2019-06-05 MED ORDER — SODIUM CHLORIDE 0.9 % IV SOLN
INTRAVENOUS | Status: DC
  Administered 2019-06-05: 09:00:00 via INTRAVENOUS
  Filled 2019-06-05 (×2): qty 250

## 2019-06-05 MED ORDER — SODIUM CHLORIDE 0.9% FLUSH
10.0000 mL | INTRAVENOUS | Status: DC | PRN
  Administered 2019-06-05: 11:00:00 10 mL
  Filled 2019-06-05: qty 10

## 2019-06-05 MED FILL — TRIAMCINOLONE ACETONIDE 55: 55 | 30 days supply | Qty: 17 | Fill #0

## 2019-06-05 NOTE — Patient Instructions (Signed)
Implanted [facility] Guide An implanted port is a device that is placed under the skin. It is usually placed in the chest. The device can be used to give IV medicine, to take blood, or for dialysis. You may have an implanted port if:  You need IV medicine that would be irritating to the small veins in your hands or arms.  You need IV medicines, such as antibiotics, for a long period of time.  You need IV nutrition for a long period of time.  You need dialysis. Having a port means that your health care provider will not need to use the veins in your arms for these procedures. You may have fewer limitations when using a port than you would if you used other types of long-term IVs, and you will likely be able to return to normal activities after your incision heals. An implanted port has two main parts:  Reservoir. The reservoir is the part where a needle is inserted to give medicines or draw blood. The reservoir is round. After it is placed, it appears as a small, raised area under your skin.  Catheter. The catheter is a thin, flexible tube that connects the reservoir to a vein. Medicine that is inserted into the reservoir goes into the catheter and then into the vein. How is my port accessed? To access your port:  A numbing cream may be placed on the skin over the port site.  Your health care provider will put on a mask and sterile gloves.  The skin over your port will be cleaned carefully with a germ-killing soap and allowed to dry.  Your health care provider will gently pinch the port and insert a needle into it.  Your health care provider will check for a blood return to make sure the port is in the vein and is not clogged.  If your port needs to remain accessed to get medicine continuously (constant infusion), your health care provider will place a clear bandage (dressing) over the needle site. The dressing and needle will need to be changed every week, or as told by your health care  provider. What is flushing? Flushing helps keep the port from getting clogged. Follow instructions from your health care provider about how and when to flush the port. Ports are usually flushed with saline solution or a medicine called heparin. The need for flushing will depend on how the port is used:  If the port is only used from time to time to give medicines or draw blood, the port may need to be flushed: ? Before and after medicines have been given. ? Before and after blood has been drawn. ? As part of routine maintenance. Flushing may be recommended every 4-6 weeks.  If a constant infusion is running, the port may not need to be flushed.  Throw away any syringes in a disposal container that is meant for sharp items (sharps container). You can buy a sharps container from a pharmacy, or you can make one by using an empty hard plastic bottle with a cover. How long will my port stay implanted? The port can stay in for as long as your health care provider thinks it is needed. When it is time for the port to come out, a surgery will be done to remove it. The surgery will be similar to the procedure that was done to put the port in. Follow these instructions at home:   Flush your port as told by your health care provider.  If you need an infusion over several days, follow instructions from your health care provider about how to take care of your port site. Make sure you: ? Wash your hands with soap and water before you change your dressing. If soap and water are not available, use alcohol-based hand sanitizer. ? Change your dressing as told by your health care provider. ? Place any used dressings or infusion bags into a plastic bag. Throw that bag in the trash. ? Keep the dressing that covers the needle clean and dry. Do not get it wet. ? Do not use scissors or sharp objects near the tube. ? Keep the tube clamped, unless it is being used.  Check your port site every day for signs of  infection. Check for: ? Redness, swelling, or pain. ? Fluid or blood. ? Pus or a bad smell.  Protect the skin around the port site. ? Avoid wearing bra straps that rub or irritate the site. ? Protect the skin around your port from seat belts. Place a soft pad over your chest if needed.  Bathe or shower as told by your health care provider. The site may get wet as long as you are not actively receiving an infusion.  Return to your normal activities as told by your health care provider. Ask your health care provider what activities are safe for you.  Carry a medical alert card or wear a medical alert bracelet at all times. This will let health care providers know that you have an implanted port in case of an emergency. Get help right away if:  You have redness, swelling, or pain at the port site.  You have fluid or blood coming from your port site.  You have pus or a bad smell coming from the port site.  You have a fever. Summary  Implanted ports are usually placed in the chest for long-term IV access.  Follow instructions from your health care provider about flushing the port and changing bandages (dressings).  Take care of the area around your port by avoiding clothing that puts pressure on the area, and by watching for signs of infection.  Protect the skin around your port from seat belts. Place a soft pad over your chest if needed.  Get help right away if you have a fever or you have redness, swelling, pain, drainage, or a bad smell at the port site. This information is not intended to replace advice given to you by your health care provider. Make sure you discuss any questions you have with your health care provider. Document Released: 07/16/2005 Document Revised: 11/07/2018 Document Reviewed: 08/18/2016 Elsevier Patient Education  2020 [employer].

## 2019-06-08 ENCOUNTER — Other Ambulatory Visit: Payer: Self-pay

## 2019-06-08 ENCOUNTER — Encounter (HOSPITAL_COMMUNITY)
Admission: RE | Admit: 2019-06-08 | Discharge: 2019-06-08 | Disposition: A | Discharge status: Home / Self Care | Payer: 59 | Source: Ambulatory Visit | Attending: Hematology & Oncology | Admitting: Hematology & Oncology

## 2019-06-08 DIAGNOSIS — C3491 Malignant neoplasm of unspecified part of right bronchus or lung: Secondary | ICD-10-CM | POA: Diagnosis not present

## 2019-06-08 DIAGNOSIS — C3490 Malignant neoplasm of unspecified part of unspecified bronchus or lung: Secondary | ICD-10-CM | POA: Diagnosis not present

## 2019-06-08 LAB — GLUCOSE, CAPILLARY: Glucose-Capillary: 88 mg/dL (ref 70–99)

## 2019-06-08 MED ORDER — FLUDEOXYGLUCOSE F - 18 (FDG) INJECTION
9.5200 | Freq: Once | INTRAVENOUS | Status: AC
  Administered 2019-06-08: 9.52 via INTRAVENOUS

## 2019-06-09 ENCOUNTER — Telehealth: Payer: Self-pay | Admitting: *Deleted

## 2019-06-09 NOTE — Telephone Encounter (Signed)
-----   Message from Taylor Napoleon, MD sent at 06/09/2019 12:31 PM EST ----- Call =- the cancer is smaller!!! Great job!!!  [name]

## 2019-06-09 NOTE — Telephone Encounter (Signed)
Notified pt of results. Pt verbalized understanding. No concerns at this time.

## 2019-06-12 ENCOUNTER — Other Ambulatory Visit: Payer: Self-pay | Admitting: Family

## 2019-06-12 ENCOUNTER — Inpatient Hospital Stay: Payer: 59

## 2019-06-12 ENCOUNTER — Encounter: Payer: Self-pay | Admitting: *Deleted

## 2019-06-12 ENCOUNTER — Telehealth: Payer: Self-pay | Admitting: *Deleted

## 2019-06-12 ENCOUNTER — Other Ambulatory Visit: Payer: Self-pay

## 2019-06-12 VITALS — BP 111/61 | HR 89 | Temp 98.7°F | Resp 19

## 2019-06-12 DIAGNOSIS — C3491 Malignant neoplasm of unspecified part of right bronchus or lung: Secondary | ICD-10-CM

## 2019-06-12 DIAGNOSIS — Z7982 Long term (current) use of aspirin: Secondary | ICD-10-CM | POA: Diagnosis not present

## 2019-06-12 DIAGNOSIS — Z85528 Personal history of other malignant neoplasm of kidney: Secondary | ICD-10-CM | POA: Diagnosis not present

## 2019-06-12 DIAGNOSIS — Z8546 Personal history of malignant neoplasm of prostate: Secondary | ICD-10-CM | POA: Diagnosis not present

## 2019-06-12 DIAGNOSIS — Z905 Acquired absence of kidney: Secondary | ICD-10-CM | POA: Diagnosis not present

## 2019-06-12 DIAGNOSIS — I959 Hypotension, unspecified: Secondary | ICD-10-CM | POA: Diagnosis not present

## 2019-06-12 DIAGNOSIS — E222 Syndrome of inappropriate secretion of antidiuretic hormone: Secondary | ICD-10-CM | POA: Diagnosis not present

## 2019-06-12 DIAGNOSIS — K5900 Constipation, unspecified: Secondary | ICD-10-CM | POA: Diagnosis not present

## 2019-06-12 DIAGNOSIS — Z79899 Other long term (current) drug therapy: Secondary | ICD-10-CM | POA: Diagnosis not present

## 2019-06-12 DIAGNOSIS — C649 Malignant neoplasm of unspecified kidney, except renal pelvis: Secondary | ICD-10-CM

## 2019-06-12 LAB — CBC WITH DIFFERENTIAL (CANCER CENTER ONLY)
Abs Immature Granulocytes: 0.01 10*3/uL (ref 0.00–0.07)
Basophils Absolute: 0 10*3/uL (ref 0.0–0.1)
Basophils Relative: 1 %
Eosinophils Absolute: 0 10*3/uL (ref 0.0–0.5)
Eosinophils Relative: 2 %
HCT: 30.2 % — ABNORMAL LOW (ref 39.0–52.0)
Hemoglobin: 9.9 g/dL — ABNORMAL LOW (ref 13.0–17.0)
Immature Granulocytes: 1 %
Lymphocytes Relative: 21 %
Lymphs Abs: 0.4 10*3/uL — ABNORMAL LOW (ref 0.7–4.0)
MCH: 30 pg (ref 26.0–34.0)
MCHC: 32.8 g/dL (ref 30.0–36.0)
MCV: 91.5 fL (ref 80.0–100.0)
Monocytes Absolute: 0.4 10*3/uL (ref 0.1–1.0)
Monocytes Relative: 23 %
Neutro Abs: 1 10*3/uL — ABNORMAL LOW (ref 1.7–7.7)
Neutrophils Relative %: 52 %
Platelet Count: 170 10*3/uL (ref 150–400)
RBC: 3.3 MIL/uL — ABNORMAL LOW (ref 4.22–5.81)
RDW: 15.6 % — ABNORMAL HIGH (ref 11.5–15.5)
WBC Count: 1.9 10*3/uL — ABNORMAL LOW (ref 4.0–10.5)
nRBC: 0 % (ref 0.0–0.2)

## 2019-06-12 LAB — CMP (CANCER CENTER ONLY)
ALT: 54 U/L — ABNORMAL HIGH (ref 0–44)
AST: 30 U/L (ref 15–41)
Albumin: 3 g/dL — ABNORMAL LOW (ref 3.5–5.0)
Alkaline Phosphatase: 116 U/L (ref 38–126)
Anion gap: 7 (ref 5–15)
BUN: 33 mg/dL — ABNORMAL HIGH (ref 8–23)
CO2: 23 mmol/L (ref 22–32)
Calcium: 8.9 mg/dL (ref 8.9–10.3)
Chloride: 111 mmol/L (ref 98–111)
Creatinine: 2.15 mg/dL — ABNORMAL HIGH (ref 0.61–1.24)
GFR, Est AFR Am: 36 mL/min — ABNORMAL LOW (ref >60)
GFR, Estimated: 31 mL/min — ABNORMAL LOW (ref >60)
Glucose, Bld: 104 mg/dL — ABNORMAL HIGH (ref 70–99)
Potassium: 4.2 mmol/L (ref 3.5–5.1)
Sodium: 141 mmol/L (ref 135–145)
Total Bilirubin: 0.6 mg/dL (ref 0.3–1.2)
Total Protein: 4.9 g/dL — ABNORMAL LOW (ref 6.5–8.1)

## 2019-06-12 MED ORDER — SODIUM CHLORIDE 0.9 % IV SOLN
Freq: Once | INTRAVENOUS | Status: AC
  Administered 2019-06-12: 10:00:00 via INTRAVENOUS
  Filled 2019-06-12: qty 250

## 2019-06-12 NOTE — Progress Notes (Signed)
  Oncology Nurse Navigator Documentation  Navigator Location: [facility] [phone]/13/20 0945)   )Navigator Encounter Type: Treatment (06/12/19 0945)                     Patient Visit Type: MedOnc (06/12/19 0945) Treatment Phase: Active Tx (06/12/19 0945) Barriers/Navigation Needs: No Barriers At This Time;No Needs (06/12/19 0945)   Interventions: Psycho-Social Support (06/12/19 0945)            Acuity: Level 1-No Barriers (06/12/19 0945)         Time Spent with Patient: 15 (06/12/19 0945)

## 2019-06-12 NOTE — Telephone Encounter (Signed)
Patient notified per order of Dr. Marin Olp that CMET and CBC results are "looking good" and that there is no reason for his wife to not go out of town this weekend.  Pt appreciative of call and has no questions or concerns at this time.

## 2019-06-12 NOTE — Patient Instructions (Signed)
Dehydration, Adult  Dehydration is when there is not enough fluid or water in your body. This happens when you lose more fluids than you take in. Dehydration can range from mild to very bad. It should be treated right away to keep it from getting very bad. Symptoms of mild dehydration may include:  Thirst.  Dry lips.  Slightly dry mouth.  Dry, warm skin.  Dizziness. Symptoms of moderate dehydration may include:  Very dry mouth.  Muscle cramps.  Dark pee (urine). Pee may be the color of tea.  Your body making less pee.  Your eyes making fewer tears.  Heartbeat that is uneven or faster than normal (palpitations).  Headache.  Light-headedness, especially when you stand up from sitting.  Fainting (syncope). Symptoms of very bad dehydration may include:  Changes in skin, such as: ? Cold and clammy skin. ? Blotchy (mottled) or pale skin. ? Skin that does not quickly return to normal after being lightly pinched and let go (poor skin turgor).  Changes in body fluids, such as: ? Feeling very thirsty. ? Your eyes making fewer tears. ? Not sweating when body temperature is high, such as in hot weather. ? Your body making very little pee.  Changes in vital signs, such as: ? Weak pulse. ? Pulse that is more than 100 beats a minute when you are sitting still. ? Fast breathing. ? Low blood pressure.  Other changes, such as: ? Sunken eyes. ? Cold hands and feet. ? Confusion. ? Lack of energy (lethargy). ? Trouble waking up from sleep. ? Short-term weight loss. ? Unconsciousness. Follow these instructions at home:   If told by your doctor, drink an ORS: ? Make an ORS by using instructions on the package. ? Start by drinking small amounts, about  cup (120 mL) every 5-10 minutes. ? Slowly drink more until you have had the amount that your doctor said to have.  Drink enough clear fluid to keep your pee clear or pale yellow. If you were told to drink an ORS, finish the  ORS first, then start slowly drinking clear fluids. Drink fluids such as: ? Water. Do not drink only water by itself. Doing that can make the salt (sodium) level in your body get too low (hyponatremia). ? Ice chips. ? Fruit juice that you have added water to (diluted). ? Low-calorie sports drinks.  Avoid: ? Alcohol. ? Drinks that have a lot of sugar. These include high-calorie sports drinks, fruit juice that does not have water added, and soda. ? Caffeine. ? Foods that are greasy or have a lot of fat or sugar.  Take over-the-counter and prescription medicines only as told by your doctor.  Do not take salt tablets. Doing that can make the salt level in your body get too high (hypernatremia).  Eat foods that have minerals (electrolytes). Examples include bananas, oranges, potatoes, tomatoes, and spinach.  Keep all follow-up visits as told by your doctor. This is important. Contact a doctor if:  You have belly (abdominal) pain that: ? Gets worse. ? Stays in one area (localizes).  You have a rash.  You have a stiff neck.  You get angry or annoyed more easily than normal (irritability).  You are more sleepy than normal.  You have a harder time waking up than normal.  You feel: ? Weak. ? Dizzy. ? Very thirsty.  You have peed (urinated) only a small amount of very dark pee during 6-8 hours. Get help right away if:  You have   symptoms of very bad dehydration.  You cannot drink fluids without throwing up (vomiting).  Your symptoms get worse with treatment.  You have a fever.  You have a very bad headache.  You are throwing up or having watery poop (diarrhea) and it: ? Gets worse. ? Does not go away.  You have blood or something green (bile) in your throw-up.  You have blood in your poop (stool). This may cause poop to look black and tarry.  You have not peed in 6-8 hours.  You pass out (faint).  Your heart rate when you are sitting still is more than 100 beats a  minute.  You have trouble breathing. This information is not intended to replace advice given to you by your health care provider. Make sure you discuss any questions you have with your health care provider. Document Released: 05/12/2009 Document Revised: 06/28/2017 Document Reviewed: 09/09/2015 Elsevier Patient Education  2020 Elsevier Inc.  

## 2019-06-13 ENCOUNTER — Encounter: Payer: Self-pay | Admitting: *Deleted

## 2019-06-15 ENCOUNTER — Telehealth: Payer: Self-pay | Admitting: *Deleted

## 2019-06-15 ENCOUNTER — Other Ambulatory Visit: Payer: Self-pay | Admitting: *Deleted

## 2019-06-15 MED ORDER — DRONABINOL 5 MG PO CAPS: 5.0000 mg | ORAL_CAPSULE | Freq: Two times a day (BID) | ORAL | 1 refills | Status: AC

## 2019-06-15 MED FILL — DRONABINOL 5 MG CAPSULE: 5 | 30 days supply | Qty: 60 | Fill #0

## 2019-06-15 NOTE — Telephone Encounter (Signed)
Received a call from patients wife stating that patient is not eating and has very little appetite.  Feels husband is losing weight.  Discussed with Dr. Marin Olp.  Rx Marinol bid.  Also discussed seeing the dietician on Tuesday when she is here. Discussed with Charlsie Merles. Will work on referral tomorrow.

## 2019-06-16 ENCOUNTER — Encounter: Payer: Self-pay | Admitting: *Deleted

## 2019-06-16 NOTE — Progress Notes (Signed)
Patient reached out seeking some assistance with his nutrition. Called patient. He would prefer a phone appointment to save him from coming into the hospital another day.  Appointment made for patient for next Tuesday. Patient aware of appointment.

## 2019-06-17 ENCOUNTER — Inpatient Hospital Stay: Payer: 59

## 2019-06-17 ENCOUNTER — Inpatient Hospital Stay (HOSPITAL_BASED_OUTPATIENT_CLINIC_OR_DEPARTMENT_OTHER): Payer: 59 | Admitting: Hematology & Oncology

## 2019-06-17 ENCOUNTER — Encounter: Payer: Self-pay | Admitting: Hematology & Oncology

## 2019-06-17 ENCOUNTER — Other Ambulatory Visit: Payer: Self-pay

## 2019-06-17 VITALS — BP 150/75 | HR 87 | Resp 18

## 2019-06-17 DIAGNOSIS — L03119 Cellulitis of unspecified part of limb: Secondary | ICD-10-CM

## 2019-06-17 DIAGNOSIS — R05 Cough: Secondary | ICD-10-CM

## 2019-06-17 DIAGNOSIS — K5900 Constipation, unspecified: Secondary | ICD-10-CM | POA: Diagnosis not present

## 2019-06-17 DIAGNOSIS — Z905 Acquired absence of kidney: Secondary | ICD-10-CM | POA: Diagnosis not present

## 2019-06-17 DIAGNOSIS — Z85528 Personal history of other malignant neoplasm of kidney: Secondary | ICD-10-CM | POA: Diagnosis not present

## 2019-06-17 DIAGNOSIS — C3491 Malignant neoplasm of unspecified part of right bronchus or lung: Secondary | ICD-10-CM

## 2019-06-17 DIAGNOSIS — E222 Syndrome of inappropriate secretion of antidiuretic hormone: Secondary | ICD-10-CM | POA: Diagnosis not present

## 2019-06-17 DIAGNOSIS — E860 Dehydration: Secondary | ICD-10-CM

## 2019-06-17 DIAGNOSIS — R059 Cough, unspecified: Secondary | ICD-10-CM

## 2019-06-17 DIAGNOSIS — Z79899 Other long term (current) drug therapy: Secondary | ICD-10-CM | POA: Diagnosis not present

## 2019-06-17 DIAGNOSIS — Z7982 Long term (current) use of aspirin: Secondary | ICD-10-CM | POA: Diagnosis not present

## 2019-06-17 DIAGNOSIS — Z8546 Personal history of malignant neoplasm of prostate: Secondary | ICD-10-CM | POA: Diagnosis not present

## 2019-06-17 DIAGNOSIS — I959 Hypotension, unspecified: Secondary | ICD-10-CM | POA: Diagnosis not present

## 2019-06-17 LAB — CBC WITH DIFFERENTIAL (CANCER CENTER ONLY)
Abs Immature Granulocytes: 0.04 10*3/uL (ref 0.00–0.07)
Basophils Absolute: 0 10*3/uL (ref 0.0–0.1)
Basophils Relative: 1 %
Eosinophils Absolute: 0.1 10*3/uL (ref 0.0–0.5)
Eosinophils Relative: 2 %
HCT: 34.2 % — ABNORMAL LOW (ref 39.0–52.0)
Hemoglobin: 11.2 g/dL — ABNORMAL LOW (ref 13.0–17.0)
Immature Granulocytes: 1 %
Lymphocytes Relative: 12 %
Lymphs Abs: 0.5 10*3/uL — ABNORMAL LOW (ref 0.7–4.0)
MCH: 29.6 pg (ref 26.0–34.0)
MCHC: 32.7 g/dL (ref 30.0–36.0)
MCV: 90.5 fL (ref 80.0–100.0)
Monocytes Absolute: 0.5 10*3/uL (ref 0.1–1.0)
Monocytes Relative: 13 %
Neutro Abs: 2.7 10*3/uL (ref 1.7–7.7)
Neutrophils Relative %: 71 %
Platelet Count: 252 10*3/uL (ref 150–400)
RBC: 3.78 MIL/uL — ABNORMAL LOW (ref 4.22–5.81)
RDW: 15.5 % (ref 11.5–15.5)
WBC Count: 3.7 10*3/uL — ABNORMAL LOW (ref 4.0–10.5)
nRBC: 0 % (ref 0.0–0.2)

## 2019-06-17 LAB — CMP (CANCER CENTER ONLY)
ALT: 44 U/L (ref 0–44)
AST: 28 U/L (ref 15–41)
Albumin: 3.1 g/dL — ABNORMAL LOW (ref 3.5–5.0)
Alkaline Phosphatase: 123 U/L (ref 38–126)
Anion gap: 8 (ref 5–15)
BUN: 35 mg/dL — ABNORMAL HIGH (ref 8–23)
CO2: 25 mmol/L (ref 22–32)
Calcium: 9.3 mg/dL (ref 8.9–10.3)
Chloride: 109 mmol/L (ref 98–111)
Creatinine: 2.67 mg/dL — ABNORMAL HIGH (ref 0.61–1.24)
GFR, Est AFR Am: 27 mL/min — ABNORMAL LOW (ref >60)
GFR, Estimated: 24 mL/min — ABNORMAL LOW (ref >60)
Glucose, Bld: 110 mg/dL — ABNORMAL HIGH (ref 70–99)
Potassium: 4 mmol/L (ref 3.5–5.1)
Sodium: 142 mmol/L (ref 135–145)
Total Bilirubin: 0.6 mg/dL (ref 0.3–1.2)
Total Protein: 5 g/dL — ABNORMAL LOW (ref 6.5–8.1)

## 2019-06-17 LAB — LACTATE DEHYDROGENASE: LDH: 156 U/L (ref 98–192)

## 2019-06-17 MED ORDER — SODIUM CHLORIDE 0.9% FLUSH
10.0000 mL | INTRAVENOUS | Status: DC | PRN
  Administered 2019-06-17: 12:00:00 10 mL
  Filled 2019-06-17: qty 10

## 2019-06-17 MED ORDER — SODIUM CHLORIDE 0.9 % IV SOLN
Freq: Once | INTRAVENOUS | Status: AC
  Administered 2019-06-17: 10:00:00 via INTRAVENOUS
  Filled 2019-06-17: qty 250

## 2019-06-17 MED ORDER — LORAZEPAM 1 MG PO TABS: 1.0000 mg | ORAL_TABLET | Freq: Four times a day (QID) | ORAL | 0 refills | Status: DC | PRN

## 2019-06-17 MED ORDER — HEPARIN SOD (PORK) LOCK FLUSH 100 UNIT/ML IV SOLN
500.0000 [IU] | Freq: Once | INTRAVENOUS | Status: AC | PRN
  Administered 2019-06-17: 500 [IU]
  Filled 2019-06-17: qty 5

## 2019-06-17 MED ORDER — HYDROCODONE-HOMATROPINE 5-1.5 MG/5ML PO SYRP: 5.0000 mL | ORAL_SOLUTION | ORAL | 0 refills | Status: DC | PRN

## 2019-06-17 MED FILL — LORAZEPAM 1 MG TABS: 1 | 15 days supply | Qty: 60 | Fill #0

## 2019-06-17 MED FILL — HYDROCODONE-HOMATROPINE SYR: 5-1.5 | 10 days supply | Qty: 300 | Fill #0

## 2019-06-17 NOTE — Progress Notes (Signed)
Hematology and Oncology Follow Up Visit  Taylor Elliott [id number] 1951-02-05 68 y.o. 06/17/2019   Principle Diagnosis:   Extensive stage small cell lung cancer  SIADH secondary to small cell lung cancer  Current Therapy:           Carboplatinum/etoposide/Tecentriq-cycle #6  Atezolizumab 1200 mg IV q 3 week -- maintenance -  Start 01/21/2019  XRT for CNS mets -- completed on 03/27/2019  Lurbinectedin -- s/p cycle #2 -- started on 05/07/2019     Interim History:  [name] is back for follow-up.  Unfortunately, we have some problems right now with him.  His renal function is going up.  He only has 1 kidney.  His creatinine is now 2.67.  3 weeks ago his creatinine was 1.0.  He is lost 16 pounds.  He just is not eating.  His blood pressure is quite low.  We really need to adjust his medications.  I told him to stop the amlodipine and also the lisinopril.  I told him to also stop allopurinol.  I am not going to give him chemotherapy today.  I just think that he is in no condition to take chemotherapy.  With his worsening renal function, his low blood pressure, his weight loss, we really need to adjust and step back and try to figure out what is going on.  His last PET scan seem to show that things were little bit better.  There has been no problems with nausea or vomiting.  I have on Marinol.  He says this causes a little bit of dizziness.  He is not having any further diarrhea.  In fact, he says he is a little constipated.  I think he is still smoking.  His 41th anniversary is coming up this Sunday.  This is an incredibly important event for he and his wife.  I really want him to feel better and get better for this event.  He will get IV fluids today.  Overall, his performance status is ECOG 2.   Medications:  Current Outpatient Medications:  .  allopurinol (ZYLOPRIM) 100 MG tablet, TAKE 1 TABLET BY MOUTH 2 TIMES DAILY. (Patient taking differently: Take 100 mg by mouth  daily. ), Disp: 180 tablet, Rfl: 3 .  amLODipine (NORVASC) 10 MG tablet, TAKE 1 TABLET BY MOUTH EVERY MORNING, Disp: 90 tablet, Rfl: 1 .  aspirin EC 81 MG tablet, Take 81 mg by mouth daily., Disp: , Rfl:  .  brimonidine (ALPHAGAN) 0.2 % ophthalmic solution, Place 1 drop into the right eye 2 (two) times daily., Disp: 5 mL, Rfl: 12 .  calcium carbonate (TUMS - DOSED IN MG ELEMENTAL CALCIUM) 500 MG chewable tablet, Chew 2 tablets by mouth daily as needed for indigestion or heartburn. , Disp: , Rfl:  .  demeclocycline (DECLOMYCIN) 150 MG tablet, Take 2 tablets (300 mg total) by mouth 2 (two) times daily., Disp: 120 tablet, Rfl: 2 .  dexamethasone (DECADRON) 4 MG tablet, Take 2 tablets (8 mg total) by mouth daily. Start the day after chemotherapy for 2 days. Take with food., Disp: 30 tablet, Rfl: 1 .  fenofibrate 160 MG tablet, TAKE 1 TABLET (160 MG TOTAL) BY MOUTH DAILY., Disp: 90 tablet, Rfl: 1 .  HYDROcodone-acetaminophen (NORCO/VICODIN) 5-325 MG tablet, Take 1 tablet by mouth every 6 (six) hours as needed for moderate pain., Disp: 60 tablet, Rfl: 0 .  HYDROcodone-homatropine (HYCODAN) 5-1.5 MG/5ML syrup, Take 5 mLs by mouth every 4 (four) hours as needed for cough.,  Disp: 250 mL, Rfl: 0 .  lidocaine-prilocaine (EMLA) cream, Apply to affected area once, Disp: 30 g, Rfl: 3 .  lisinopril (ZESTRIL) 20 MG tablet, TAKE 1 TABLET BY MOUTH ONCE DAILY, Disp: 90 tablet, Rfl: 1 .  LORazepam (ATIVAN) 1 MG tablet, Take 1 tablet (1 mg total) by mouth every 6 (six) hours as needed for anxiety., Disp: 60 tablet, Rfl: 0 .  Multiple Vitamin (MULTIVITAMIN) tablet, Take 1 tablet by mouth daily., Disp: , Rfl:  .  ondansetron (ZOFRAN) 8 MG tablet, Take 1 tablet (8 mg total) by mouth 2 (two) times daily as needed for refractory nausea / vomiting. Start on day 3 after chemotherapy., Disp: 30 tablet, Rfl: 1 .  pyridoxine (B-6) 200 MG tablet, Take 200 mg by mouth daily., Disp: , Rfl:  .  sodium chloride 1 g tablet, Take 1  tablet (1 g total) by mouth 2 (two) times daily with a meal., Disp: 60 tablet, Rfl: 0 .  solifenacin (VESICARE) 10 MG tablet, Take 10 mg by mouth every evening., Disp: , Rfl:  .  temazepam (RESTORIL) 30 MG capsule, Take 1 capsule (30 mg total) by mouth at bedtime as needed for sleep., Disp: 30 capsule, Rfl: 2 .  UNABLE TO FIND, Med Name: Allergy shots., Disp: , Rfl:  .  dronabinol (MARINOL) 5 MG capsule, Take 1 capsule (5 mg total) by mouth 2 (two) times daily before a meal. (Patient not taking: Reported on 06/17/2019), Disp: 60 capsule, Rfl: 1 .  EPINEPHrine 0.3 mg/0.3 mL IJ SOAJ injection, Inject 0.3 mg into the muscle once., Disp: , Rfl:  .  lactulose (CHRONULAC) 10 GM/15ML solution, Take 15 mLs (10 g total) by mouth 2 (two) times daily as needed for mild constipation. (Patient not taking: Reported on 06/17/2019), Disp: 473 mL, Rfl: 1 .  prochlorperazine (COMPAZINE) 10 MG tablet, Take 1 tablet (10 mg total) by mouth every 6 (six) hours as needed (Nausea or vomiting). (Patient not taking: Reported on 06/17/2019), Disp: 30 tablet, Rfl: 1 .  rosuvastatin (CRESTOR) 10 MG tablet, TAKE 1 TABLET BY MOUTH ONCE DAILY (Patient not taking: Reported on 05/27/2019), Disp: 90 tablet, Rfl: 1  Allergies:  Allergies  Allergen Reactions  . Bee Venom Anaphylaxis  . Clindamycin/Lincomycin Hives    Past Medical History, Surgical history, Social history, and Family History were reviewed and updated.  Review of Systems: Review of Systems  Constitutional: Negative.   HENT:  Negative.   Eyes: Negative.   Respiratory: Positive for cough.   Cardiovascular: Negative.   Gastrointestinal: Negative.   Endocrine: Negative.   Genitourinary: Negative.    Musculoskeletal: Positive for back pain, myalgias and neck pain.  Skin: Positive for itching and rash.  Neurological: Negative.   Hematological: Negative.   Psychiatric/Behavioral: Negative.     Physical Exam:  weight is 178 lb (80.7 kg). His temporal  temperature is 98 F (36.7 C). His blood pressure is 89/63 (abnormal) and his pulse is 93. His respiration is 20 and oxygen saturation is 100%.   Wt Readings from Last 3 Encounters:  06/17/19 178 lb (80.7 kg)  05/27/19 196 lb (88.9 kg)  05/07/19 203 lb (92.1 kg)    Physical Exam Vitals signs reviewed.  HENT:     Head: Normocephalic and atraumatic.  Eyes:     Pupils: Pupils are equal, round, and reactive to light.  Neck:     Musculoskeletal: Normal range of motion.  Cardiovascular:     Rate and Rhythm: Normal rate and regular rhythm.  Heart sounds: Normal heart sounds.  Pulmonary:     Effort: Pulmonary effort is normal.     Breath sounds: Normal breath sounds.  Abdominal:     General: Bowel sounds are normal.     Palpations: Abdomen is soft.     Comments: Abdominal exam shows a slightly obese abdomen.  He does have an umbilical hernia.  I really cannot palpate his liver at this point.  There is no fluid wave.  There is no inguinal adenopathy.  There is no splenomegaly.    Musculoskeletal: Normal range of motion.        General: No tenderness or deformity.  Lymphadenopathy:     Cervical: No cervical adenopathy.  Skin:    General: Skin is warm and dry.     Findings: No erythema or rash.  Neurological:     Mental Status: He is alert and oriented to person, place, and time.  Psychiatric:        Behavior: Behavior normal.        Thought Content: Thought content normal.        Judgment: Judgment normal.      Lab Results  Component Value Date   WBC 3.7 (L) 06/17/2019   HGB 11.2 (L) 06/17/2019   HCT 34.2 (L) 06/17/2019   MCV 90.5 06/17/2019   PLT 252 06/17/2019     Chemistry      Component Value Date/Time   NA 142 06/17/2019 0840   NA 135 (L) 07/12/2015 0813   K 4.0 06/17/2019 0840   K 4.5 07/12/2015 0813   CL 109 06/17/2019 0840   CO2 25 06/17/2019 0840   CO2 24 07/12/2015 0813   BUN 35 (H) 06/17/2019 0840   BUN 15.7 07/12/2015 0813   CREATININE 2.67 (H)  06/17/2019 0840   CREATININE 1.6 (H) 07/12/2015 0813      Component Value Date/Time   CALCIUM 9.3 06/17/2019 0840   CALCIUM 10.1 07/12/2015 0813   ALKPHOS 123 06/17/2019 0840   ALKPHOS 115 07/12/2015 0813   AST 28 06/17/2019 0840   AST 22 07/12/2015 0813   ALT 44 06/17/2019 0840   ALT 24 07/12/2015 0813   BILITOT 0.6 06/17/2019 0840   BILITOT 0.46 07/12/2015 0813       Impression and Plan: Mr. Popowski is a 68 year old white male.  He has a past history of renal cell carcinoma and also prostate cancer.  Now, he has a third malignancy.  This is metastatic small cell lung cancer.  Hopefully, we can get things reversed by giving him IV fluids today.  Hopefully cutting back his medications will also help.  Again, the PET scan that was done on 06/08/2019 showed an improvement.  He will come back on Friday for IV fluids.  If his labs and blood pressure are better, we might do his treatment then.  Otherwise, I will have to wait a couple weeks.  I do not want to treat him next week with Thanksgiving coming up.  This was very complicated today.  He had a lot of issues that we had to address.  I took about 40 minutes for [place] to go through everything so that we can try to get him feeling better.     Volanda Napoleon, MD 11/18/20209:46 AM

## 2019-06-17 NOTE — Patient Instructions (Signed)
Implanted [facility] Guide An implanted port is a device that is placed under the skin. It is usually placed in the chest. The device can be used to give IV medicine, to take blood, or for dialysis. You may have an implanted port if:  You need IV medicine that would be irritating to the small veins in your hands or arms.  You need IV medicines, such as antibiotics, for a long period of time.  You need IV nutrition for a long period of time.  You need dialysis. Having a port means that your health care provider will not need to use the veins in your arms for these procedures. You may have fewer limitations when using a port than you would if you used other types of long-term IVs, and you will likely be able to return to normal activities after your incision heals. An implanted port has two main parts:  Reservoir. The reservoir is the part where a needle is inserted to give medicines or draw blood. The reservoir is round. After it is placed, it appears as a small, raised area under your skin.  Catheter. The catheter is a thin, flexible tube that connects the reservoir to a vein. Medicine that is inserted into the reservoir goes into the catheter and then into the vein. How is my port accessed? To access your port:  A numbing cream may be placed on the skin over the port site.  Your health care provider will put on a mask and sterile gloves.  The skin over your port will be cleaned carefully with a germ-killing soap and allowed to dry.  Your health care provider will gently pinch the port and insert a needle into it.  Your health care provider will check for a blood return to make sure the port is in the vein and is not clogged.  If your port needs to remain accessed to get medicine continuously (constant infusion), your health care provider will place a clear bandage (dressing) over the needle site. The dressing and needle will need to be changed every week, or as told by your health care  provider. What is flushing? Flushing helps keep the port from getting clogged. Follow instructions from your health care provider about how and when to flush the port. Ports are usually flushed with saline solution or a medicine called heparin. The need for flushing will depend on how the port is used:  If the port is only used from time to time to give medicines or draw blood, the port may need to be flushed: ? Before and after medicines have been given. ? Before and after blood has been drawn. ? As part of routine maintenance. Flushing may be recommended every 4-6 weeks.  If a constant infusion is running, the port may not need to be flushed.  Throw away any syringes in a disposal container that is meant for sharp items (sharps container). You can buy a sharps container from a pharmacy, or you can make one by using an empty hard plastic bottle with a cover. How long will my port stay implanted? The port can stay in for as long as your health care provider thinks it is needed. When it is time for the port to come out, a surgery will be done to remove it. The surgery will be similar to the procedure that was done to put the port in. Follow these instructions at home:   Flush your port as told by your health care provider.  If you need an infusion over several days, follow instructions from your health care provider about how to take care of your port site. Make sure you: ? Wash your hands with soap and water before you change your dressing. If soap and water are not available, use alcohol-based hand sanitizer. ? Change your dressing as told by your health care provider. ? Place any used dressings or infusion bags into a plastic bag. Throw that bag in the trash. ? Keep the dressing that covers the needle clean and dry. Do not get it wet. ? Do not use scissors or sharp objects near the tube. ? Keep the tube clamped, unless it is being used.  Check your port site every day for signs of  infection. Check for: ? Redness, swelling, or pain. ? Fluid or blood. ? Pus or a bad smell.  Protect the skin around the port site. ? Avoid wearing bra straps that rub or irritate the site. ? Protect the skin around your port from seat belts. Place a soft pad over your chest if needed.  Bathe or shower as told by your health care provider. The site may get wet as long as you are not actively receiving an infusion.  Return to your normal activities as told by your health care provider. Ask your health care provider what activities are safe for you.  Carry a medical alert card or wear a medical alert bracelet at all times. This will let health care providers know that you have an implanted port in case of an emergency. Get help right away if:  You have redness, swelling, or pain at the port site.  You have fluid or blood coming from your port site.  You have pus or a bad smell coming from the port site.  You have a fever. Summary  Implanted ports are usually placed in the chest for long-term IV access.  Follow instructions from your health care provider about flushing the port and changing bandages (dressings).  Take care of the area around your port by avoiding clothing that puts pressure on the area, and by watching for signs of infection.  Protect the skin around your port from seat belts. Place a soft pad over your chest if needed.  Get help right away if you have a fever or you have redness, swelling, pain, drainage, or a bad smell at the port site. This information is not intended to replace advice given to you by your health care provider. Make sure you discuss any questions you have with your health care provider. Document Released: 07/16/2005 Document Revised: 11/07/2018 Document Reviewed: 08/18/2016 Elsevier Patient Education  2020 [employer].

## 2019-06-18 ENCOUNTER — Encounter: Payer: Self-pay | Admitting: *Deleted

## 2019-06-18 ENCOUNTER — Other Ambulatory Visit: Payer: Self-pay | Admitting: *Deleted

## 2019-06-18 DIAGNOSIS — Z792 Long term (current) use of antibiotics: Secondary | ICD-10-CM

## 2019-06-18 MED ORDER — AMOXICILLIN 500 MG PO TABS: 2000.0000 mg | ORAL_TABLET | Freq: Once | ORAL | 3 refills | Status: DC | PRN

## 2019-06-18 MED FILL — AMOXICILLIN 500 MG CAPSULE: 500 | 1 days supply | Qty: 4 | Fill #0

## 2019-06-19 ENCOUNTER — Other Ambulatory Visit: Payer: Self-pay

## 2019-06-19 ENCOUNTER — Other Ambulatory Visit: Payer: Self-pay | Admitting: *Deleted

## 2019-06-19 ENCOUNTER — Inpatient Hospital Stay: Payer: 59

## 2019-06-19 ENCOUNTER — Ambulatory Visit (HOSPITAL_BASED_OUTPATIENT_CLINIC_OR_DEPARTMENT_OTHER)
Admission: RE | Admit: 2019-06-19 | Discharge: 2019-06-19 | Disposition: A | Discharge status: Home / Self Care | Payer: 59 | Source: Ambulatory Visit | Attending: Hematology & Oncology | Admitting: Hematology & Oncology

## 2019-06-19 ENCOUNTER — Other Ambulatory Visit: Payer: Self-pay | Admitting: Family

## 2019-06-19 DIAGNOSIS — C3491 Malignant neoplasm of unspecified part of right bronchus or lung: Secondary | ICD-10-CM | POA: Diagnosis not present

## 2019-06-19 DIAGNOSIS — L03119 Cellulitis of unspecified part of limb: Secondary | ICD-10-CM

## 2019-06-19 DIAGNOSIS — E860 Dehydration: Secondary | ICD-10-CM

## 2019-06-19 DIAGNOSIS — N19 Unspecified kidney failure: Secondary | ICD-10-CM

## 2019-06-19 DIAGNOSIS — N1330 Unspecified hydronephrosis: Secondary | ICD-10-CM | POA: Diagnosis not present

## 2019-06-19 LAB — CBC WITH DIFFERENTIAL (CANCER CENTER ONLY)
Abs Immature Granulocytes: 0.08 10*3/uL — ABNORMAL HIGH (ref 0.00–0.07)
Basophils Absolute: 0 10*3/uL (ref 0.0–0.1)
Basophils Relative: 0 %
Eosinophils Absolute: 0 10*3/uL (ref 0.0–0.5)
Eosinophils Relative: 1 %
HCT: 33.3 % — ABNORMAL LOW (ref 39.0–52.0)
Hemoglobin: 11 g/dL — ABNORMAL LOW (ref 13.0–17.0)
Immature Granulocytes: 2 %
Lymphocytes Relative: 7 %
Lymphs Abs: 0.4 10*3/uL — ABNORMAL LOW (ref 0.7–4.0)
MCH: 29.7 pg (ref 26.0–34.0)
MCHC: 33 g/dL (ref 30.0–36.0)
MCV: 90 fL (ref 80.0–100.0)
Monocytes Absolute: 0.5 10*3/uL (ref 0.1–1.0)
Monocytes Relative: 10 %
Neutro Abs: 4.3 10*3/uL (ref 1.7–7.7)
Neutrophils Relative %: 80 %
Platelet Count: 226 10*3/uL (ref 150–400)
RBC: 3.7 MIL/uL — ABNORMAL LOW (ref 4.22–5.81)
RDW: 15.5 % (ref 11.5–15.5)
WBC Count: 5.4 10*3/uL (ref 4.0–10.5)
nRBC: 0 % (ref 0.0–0.2)

## 2019-06-19 LAB — CMP (CANCER CENTER ONLY)
ALT: 39 U/L (ref 0–44)
AST: 25 U/L (ref 15–41)
Albumin: 3.1 g/dL — ABNORMAL LOW (ref 3.5–5.0)
Alkaline Phosphatase: 124 U/L (ref 38–126)
Anion gap: 8 (ref 5–15)
BUN: 44 mg/dL — ABNORMAL HIGH (ref 8–23)
CO2: 24 mmol/L (ref 22–32)
Calcium: 9.6 mg/dL (ref 8.9–10.3)
Chloride: 110 mmol/L (ref 98–111)
Creatinine: 2.74 mg/dL — ABNORMAL HIGH (ref 0.61–1.24)
GFR, Est AFR Am: 27 mL/min — ABNORMAL LOW (ref >60)
GFR, Estimated: 23 mL/min — ABNORMAL LOW (ref >60)
Glucose, Bld: 118 mg/dL — ABNORMAL HIGH (ref 70–99)
Potassium: 4 mmol/L (ref 3.5–5.1)
Sodium: 142 mmol/L (ref 135–145)
Total Bilirubin: 0.5 mg/dL (ref 0.3–1.2)
Total Protein: 5.6 g/dL — ABNORMAL LOW (ref 6.5–8.1)

## 2019-06-19 MED ORDER — SODIUM CHLORIDE 0.9 % IV SOLN
INTRAVENOUS | Status: DC
  Administered 2019-06-19: 09:00:00 via INTRAVENOUS
  Filled 2019-06-19 (×2): qty 250

## 2019-06-19 MED ORDER — HEPARIN SOD (PORK) LOCK FLUSH 100 UNIT/ML IV SOLN
500.0000 [IU] | Freq: Once | INTRAVENOUS | Status: AC | PRN
  Administered 2019-06-19: 500 [IU]
  Filled 2019-06-19: qty 5

## 2019-06-19 MED ORDER — SODIUM CHLORIDE 0.9% FLUSH
10.0000 mL | INTRAVENOUS | Status: DC | PRN
  Administered 2019-06-19: 10 mL
  Filled 2019-06-19: qty 10

## 2019-06-19 NOTE — Patient Instructions (Signed)
Implanted [facility] Guide An implanted port is a device that is placed under the skin. It is usually placed in the chest. The device can be used to give IV medicine, to take blood, or for dialysis. You may have an implanted port if:  You need IV medicine that would be irritating to the small veins in your hands or arms.  You need IV medicines, such as antibiotics, for a long period of time.  You need IV nutrition for a long period of time.  You need dialysis. Having a port means that your health care provider will not need to use the veins in your arms for these procedures. You may have fewer limitations when using a port than you would if you used other types of long-term IVs, and you will likely be able to return to normal activities after your incision heals. An implanted port has two main parts:  Reservoir. The reservoir is the part where a needle is inserted to give medicines or draw blood. The reservoir is round. After it is placed, it appears as a small, raised area under your skin.  Catheter. The catheter is a thin, flexible tube that connects the reservoir to a vein. Medicine that is inserted into the reservoir goes into the catheter and then into the vein. How is my port accessed? To access your port:  A numbing cream may be placed on the skin over the port site.  Your health care provider will put on a mask and sterile gloves.  The skin over your port will be cleaned carefully with a germ-killing soap and allowed to dry.  Your health care provider will gently pinch the port and insert a needle into it.  Your health care provider will check for a blood return to make sure the port is in the vein and is not clogged.  If your port needs to remain accessed to get medicine continuously (constant infusion), your health care provider will place a clear bandage (dressing) over the needle site. The dressing and needle will need to be changed every week, or as told by your health care  provider. What is flushing? Flushing helps keep the port from getting clogged. Follow instructions from your health care provider about how and when to flush the port. Ports are usually flushed with saline solution or a medicine called heparin. The need for flushing will depend on how the port is used:  If the port is only used from time to time to give medicines or draw blood, the port may need to be flushed: ? Before and after medicines have been given. ? Before and after blood has been drawn. ? As part of routine maintenance. Flushing may be recommended every 4-6 weeks.  If a constant infusion is running, the port may not need to be flushed.  Throw away any syringes in a disposal container that is meant for sharp items (sharps container). You can buy a sharps container from a pharmacy, or you can make one by using an empty hard plastic bottle with a cover. How long will my port stay implanted? The port can stay in for as long as your health care provider thinks it is needed. When it is time for the port to come out, a surgery will be done to remove it. The surgery will be similar to the procedure that was done to put the port in. Follow these instructions at home:   Flush your port as told by your health care provider.  If you need an infusion over several days, follow instructions from your health care provider about how to take care of your port site. Make sure you: ? Wash your hands with soap and water before you change your dressing. If soap and water are not available, use alcohol-based hand sanitizer. ? Change your dressing as told by your health care provider. ? Place any used dressings or infusion bags into a plastic bag. Throw that bag in the trash. ? Keep the dressing that covers the needle clean and dry. Do not get it wet. ? Do not use scissors or sharp objects near the tube. ? Keep the tube clamped, unless it is being used.  Check your port site every day for signs of  infection. Check for: ? Redness, swelling, or pain. ? Fluid or blood. ? Pus or a bad smell.  Protect the skin around the port site. ? Avoid wearing bra straps that rub or irritate the site. ? Protect the skin around your port from seat belts. Place a soft pad over your chest if needed.  Bathe or shower as told by your health care provider. The site may get wet as long as you are not actively receiving an infusion.  Return to your normal activities as told by your health care provider. Ask your health care provider what activities are safe for you.  Carry a medical alert card or wear a medical alert bracelet at all times. This will let health care providers know that you have an implanted port in case of an emergency. Get help right away if:  You have redness, swelling, or pain at the port site.  You have fluid or blood coming from your port site.  You have pus or a bad smell coming from the port site.  You have a fever. Summary  Implanted ports are usually placed in the chest for long-term IV access.  Follow instructions from your health care provider about flushing the port and changing bandages (dressings).  Take care of the area around your port by avoiding clothing that puts pressure on the area, and by watching for signs of infection.  Protect the skin around your port from seat belts. Place a soft pad over your chest if needed.  Get help right away if you have a fever or you have redness, swelling, pain, drainage, or a bad smell at the port site. This information is not intended to replace advice given to you by your health care provider. Make sure you discuss any questions you have with your health care provider. Document Released: 07/16/2005 Document Revised: 11/07/2018 Document Reviewed: 08/18/2016 Elsevier Patient Education  2020 [employer].

## 2019-06-19 NOTE — Progress Notes (Signed)
No treatment today per Dr Marin Olp. Abd U/S ordered. IVF appt added for Wednesday. dph

## 2019-06-20 ENCOUNTER — Other Ambulatory Visit (HOSPITAL_BASED_OUTPATIENT_CLINIC_OR_DEPARTMENT_OTHER): Payer: 59

## 2019-06-22 ENCOUNTER — Encounter: Payer: Self-pay | Admitting: Nutrition

## 2019-06-22 ENCOUNTER — Encounter: Payer: Self-pay | Admitting: *Deleted

## 2019-06-22 NOTE — Progress Notes (Signed)
Patient called to contact his telephone consult with nutrition.  Reports he has another appointment that he must go to.  He will call to reschedule.

## 2019-06-23 ENCOUNTER — Encounter: Payer: 59 | Admitting: Nutrition

## 2019-06-23 ENCOUNTER — Inpatient Hospital Stay: Payer: 59 | Admitting: Nutrition

## 2019-06-23 DIAGNOSIS — N1330 Unspecified hydronephrosis: Secondary | ICD-10-CM | POA: Diagnosis not present

## 2019-06-23 DIAGNOSIS — N130 Hydronephrosis with ureteropelvic junction obstruction: Secondary | ICD-10-CM | POA: Diagnosis not present

## 2019-06-24 ENCOUNTER — Inpatient Hospital Stay: Payer: 59

## 2019-06-24 ENCOUNTER — Other Ambulatory Visit: Payer: Self-pay

## 2019-06-24 ENCOUNTER — Encounter: Payer: Self-pay | Admitting: *Deleted

## 2019-06-24 VITALS — BP 115/75 | HR 93 | Temp 97.8°F | Resp 19

## 2019-06-24 DIAGNOSIS — Z8546 Personal history of malignant neoplasm of prostate: Secondary | ICD-10-CM | POA: Diagnosis not present

## 2019-06-24 DIAGNOSIS — I959 Hypotension, unspecified: Secondary | ICD-10-CM | POA: Diagnosis not present

## 2019-06-24 DIAGNOSIS — C3491 Malignant neoplasm of unspecified part of right bronchus or lung: Secondary | ICD-10-CM

## 2019-06-24 DIAGNOSIS — K5900 Constipation, unspecified: Secondary | ICD-10-CM | POA: Diagnosis not present

## 2019-06-24 DIAGNOSIS — Z905 Acquired absence of kidney: Secondary | ICD-10-CM | POA: Diagnosis not present

## 2019-06-24 DIAGNOSIS — L03119 Cellulitis of unspecified part of limb: Secondary | ICD-10-CM

## 2019-06-24 DIAGNOSIS — Z85528 Personal history of other malignant neoplasm of kidney: Secondary | ICD-10-CM | POA: Diagnosis not present

## 2019-06-24 DIAGNOSIS — E222 Syndrome of inappropriate secretion of antidiuretic hormone: Secondary | ICD-10-CM | POA: Diagnosis not present

## 2019-06-24 DIAGNOSIS — Z7982 Long term (current) use of aspirin: Secondary | ICD-10-CM | POA: Diagnosis not present

## 2019-06-24 DIAGNOSIS — Z79899 Other long term (current) drug therapy: Secondary | ICD-10-CM | POA: Diagnosis not present

## 2019-06-24 LAB — CBC WITH DIFFERENTIAL/PLATELET
Abs Immature Granulocytes: 0.07 10*3/uL (ref 0.00–0.07)
Basophils Absolute: 0 10*3/uL (ref 0.0–0.1)
Basophils Relative: 0 %
Eosinophils Absolute: 0.1 10*3/uL (ref 0.0–0.5)
Eosinophils Relative: 1 %
HCT: 32.4 % — ABNORMAL LOW (ref 39.0–52.0)
Hemoglobin: 10.6 g/dL — ABNORMAL LOW (ref 13.0–17.0)
Immature Granulocytes: 1 %
Lymphocytes Relative: 5 %
Lymphs Abs: 0.5 10*3/uL — ABNORMAL LOW (ref 0.7–4.0)
MCH: 29.6 pg (ref 26.0–34.0)
MCHC: 32.7 g/dL (ref 30.0–36.0)
MCV: 90.5 fL (ref 80.0–100.0)
Monocytes Absolute: 0.8 10*3/uL (ref 0.1–1.0)
Monocytes Relative: 9 %
Neutro Abs: 7.9 10*3/uL — ABNORMAL HIGH (ref 1.7–7.7)
Neutrophils Relative %: 84 %
Platelets: 205 10*3/uL (ref 150–400)
RBC: 3.58 MIL/uL — ABNORMAL LOW (ref 4.22–5.81)
RDW: 15.7 % — ABNORMAL HIGH (ref 11.5–15.5)
WBC: 9.4 10*3/uL (ref 4.0–10.5)
nRBC: 0 % (ref 0.0–0.2)

## 2019-06-24 LAB — COMPREHENSIVE METABOLIC PANEL
ALT: 28 U/L (ref 0–44)
AST: 19 U/L (ref 15–41)
Albumin: 3.3 g/dL — ABNORMAL LOW (ref 3.5–5.0)
Alkaline Phosphatase: 100 U/L (ref 38–126)
Anion gap: 8 (ref 5–15)
BUN: 22 mg/dL (ref 8–23)
CO2: 25 mmol/L (ref 22–32)
Calcium: 9 mg/dL (ref 8.9–10.3)
Chloride: 105 mmol/L (ref 98–111)
Creatinine, Ser: 1.83 mg/dL — ABNORMAL HIGH (ref 0.61–1.24)
GFR calc Af Amer: 43 mL/min — ABNORMAL LOW (ref >60)
GFR calc non Af Amer: 37 mL/min — ABNORMAL LOW (ref >60)
Glucose, Bld: 128 mg/dL — ABNORMAL HIGH (ref 70–99)
Potassium: 4 mmol/L (ref 3.5–5.1)
Sodium: 138 mmol/L (ref 135–145)
Total Bilirubin: 0.5 mg/dL (ref 0.3–1.2)
Total Protein: 5.8 g/dL — ABNORMAL LOW (ref 6.5–8.1)

## 2019-06-24 MED ORDER — SODIUM CHLORIDE 0.9 % IV SOLN
Freq: Once | INTRAVENOUS | Status: AC
  Administered 2019-06-24: 11:00:00 via INTRAVENOUS
  Filled 2019-06-24: qty 250

## 2019-06-24 MED ORDER — SODIUM CHLORIDE 0.9% FLUSH
10.0000 mL | INTRAVENOUS | Status: DC | PRN
  Administered 2019-06-24: 13:00:00 10 mL
  Filled 2019-06-24: qty 10

## 2019-06-24 MED ORDER — HEPARIN SOD (PORK) LOCK FLUSH 100 UNIT/ML IV SOLN
500.0000 [IU] | Freq: Once | INTRAVENOUS | Status: AC | PRN
  Administered 2019-06-24: 13:00:00 500 [IU]
  Filled 2019-06-24: qty 5

## 2019-06-24 NOTE — Patient Instructions (Signed)
Dehydration, Adult  Dehydration is when there is not enough fluid or water in your body. This happens when you lose more fluids than you take in. Dehydration can range from mild to very bad. It should be treated right away to keep it from getting very bad. Symptoms of mild dehydration may include:  Thirst.  Dry lips.  Slightly dry mouth.  Dry, warm skin.  Dizziness. Symptoms of moderate dehydration may include:  Very dry mouth.  Muscle cramps.  Dark pee (urine). Pee may be the color of tea.  Your body making less pee.  Your eyes making fewer tears.  Heartbeat that is uneven or faster than normal (palpitations).  Headache.  Light-headedness, especially when you stand up from sitting.  Fainting (syncope). Symptoms of very bad dehydration may include:  Changes in skin, such as: ? Cold and clammy skin. ? Blotchy (mottled) or pale skin. ? Skin that does not quickly return to normal after being lightly pinched and let go (poor skin turgor).  Changes in body fluids, such as: ? Feeling very thirsty. ? Your eyes making fewer tears. ? Not sweating when body temperature is high, such as in hot weather. ? Your body making very little pee.  Changes in vital signs, such as: ? Weak pulse. ? Pulse that is more than 100 beats a minute when you are sitting still. ? Fast breathing. ? Low blood pressure.  Other changes, such as: ? Sunken eyes. ? Cold hands and feet. ? Confusion. ? Lack of energy (lethargy). ? Trouble waking up from sleep. ? Short-term weight loss. ? Unconsciousness. Follow these instructions at home:   If told by your doctor, drink an ORS: ? Make an ORS by using instructions on the package. ? Start by drinking small amounts, about  cup (120 mL) every 5-10 minutes. ? Slowly drink more until you have had the amount that your doctor said to have.  Drink enough clear fluid to keep your pee clear or pale yellow. If you were told to drink an ORS, finish the  ORS first, then start slowly drinking clear fluids. Drink fluids such as: ? Water. Do not drink only water by itself. Doing that can make the salt (sodium) level in your body get too low (hyponatremia). ? Ice chips. ? Fruit juice that you have added water to (diluted). ? Low-calorie sports drinks.  Avoid: ? Alcohol. ? Drinks that have a lot of sugar. These include high-calorie sports drinks, fruit juice that does not have water added, and soda. ? Caffeine. ? Foods that are greasy or have a lot of fat or sugar.  Take over-the-counter and prescription medicines only as told by your doctor.  Do not take salt tablets. Doing that can make the salt level in your body get too high (hypernatremia).  Eat foods that have minerals (electrolytes). Examples include bananas, oranges, potatoes, tomatoes, and spinach.  Keep all follow-up visits as told by your doctor. This is important. Contact a doctor if:  You have belly (abdominal) pain that: ? Gets worse. ? Stays in one area (localizes).  You have a rash.  You have a stiff neck.  You get angry or annoyed more easily than normal (irritability).  You are more sleepy than normal.  You have a harder time waking up than normal.  You feel: ? Weak. ? Dizzy. ? Very thirsty.  You have peed (urinated) only a small amount of very dark pee during 6-8 hours. Get help right away if:  You have   symptoms of very bad dehydration.  You cannot drink fluids without throwing up (vomiting).  Your symptoms get worse with treatment.  You have a fever.  You have a very bad headache.  You are throwing up or having watery poop (diarrhea) and it: ? Gets worse. ? Does not go away.  You have blood or something green (bile) in your throw-up.  You have blood in your poop (stool). This may cause poop to look black and tarry.  You have not peed in 6-8 hours.  You pass out (faint).  Your heart rate when you are sitting still is more than 100 beats a  minute.  You have trouble breathing. This information is not intended to replace advice given to you by your health care provider. Make sure you discuss any questions you have with your health care provider. Document Released: 05/12/2009 Document Revised: 06/28/2017 Document Reviewed: 09/09/2015 Elsevier Patient Education  2020 Elsevier Inc.  

## 2019-06-30 DIAGNOSIS — E222 Syndrome of inappropriate secretion of antidiuretic hormone: Secondary | ICD-10-CM | POA: Diagnosis not present

## 2019-06-30 DIAGNOSIS — Z85528 Personal history of other malignant neoplasm of kidney: Secondary | ICD-10-CM | POA: Diagnosis not present

## 2019-06-30 DIAGNOSIS — N189 Chronic kidney disease, unspecified: Secondary | ICD-10-CM | POA: Diagnosis not present

## 2019-06-30 DIAGNOSIS — D631 Anemia in chronic kidney disease: Secondary | ICD-10-CM | POA: Diagnosis not present

## 2019-06-30 DIAGNOSIS — K219 Gastro-esophageal reflux disease without esophagitis: Secondary | ICD-10-CM | POA: Diagnosis not present

## 2019-06-30 DIAGNOSIS — E785 Hyperlipidemia, unspecified: Secondary | ICD-10-CM | POA: Diagnosis not present

## 2019-06-30 DIAGNOSIS — C3490 Malignant neoplasm of unspecified part of unspecified bronchus or lung: Secondary | ICD-10-CM | POA: Diagnosis not present

## 2019-06-30 DIAGNOSIS — N179 Acute kidney failure, unspecified: Secondary | ICD-10-CM | POA: Diagnosis not present

## 2019-06-30 DIAGNOSIS — I129 Hypertensive chronic kidney disease with stage 1 through stage 4 chronic kidney disease, or unspecified chronic kidney disease: Secondary | ICD-10-CM | POA: Diagnosis not present

## 2019-07-01 ENCOUNTER — Inpatient Hospital Stay: Payer: 59

## 2019-07-01 ENCOUNTER — Encounter: Payer: Self-pay | Admitting: Hematology & Oncology

## 2019-07-01 ENCOUNTER — Other Ambulatory Visit: Payer: Self-pay

## 2019-07-01 ENCOUNTER — Inpatient Hospital Stay: Payer: 59 | Attending: Hematology & Oncology

## 2019-07-01 ENCOUNTER — Encounter: Payer: Self-pay | Admitting: *Deleted

## 2019-07-01 ENCOUNTER — Inpatient Hospital Stay (HOSPITAL_BASED_OUTPATIENT_CLINIC_OR_DEPARTMENT_OTHER): Payer: 59 | Admitting: Hematology & Oncology

## 2019-07-01 DIAGNOSIS — Z79899 Other long term (current) drug therapy: Secondary | ICD-10-CM | POA: Insufficient documentation

## 2019-07-01 DIAGNOSIS — E222 Syndrome of inappropriate secretion of antidiuretic hormone: Secondary | ICD-10-CM | POA: Diagnosis not present

## 2019-07-01 DIAGNOSIS — N289 Disorder of kidney and ureter, unspecified: Secondary | ICD-10-CM | POA: Insufficient documentation

## 2019-07-01 DIAGNOSIS — C3491 Malignant neoplasm of unspecified part of right bronchus or lung: Secondary | ICD-10-CM | POA: Diagnosis not present

## 2019-07-01 DIAGNOSIS — Z85528 Personal history of other malignant neoplasm of kidney: Secondary | ICD-10-CM | POA: Insufficient documentation

## 2019-07-01 DIAGNOSIS — L03119 Cellulitis of unspecified part of limb: Secondary | ICD-10-CM

## 2019-07-01 DIAGNOSIS — Z5111 Encounter for antineoplastic chemotherapy: Secondary | ICD-10-CM | POA: Diagnosis not present

## 2019-07-01 DIAGNOSIS — Z8546 Personal history of malignant neoplasm of prostate: Secondary | ICD-10-CM | POA: Insufficient documentation

## 2019-07-01 DIAGNOSIS — Z7982 Long term (current) use of aspirin: Secondary | ICD-10-CM | POA: Insufficient documentation

## 2019-07-01 DIAGNOSIS — F17210 Nicotine dependence, cigarettes, uncomplicated: Secondary | ICD-10-CM | POA: Diagnosis not present

## 2019-07-01 LAB — CBC WITH DIFFERENTIAL (CANCER CENTER ONLY)
Abs Immature Granulocytes: 0.04 10*3/uL (ref 0.00–0.07)
Basophils Absolute: 0 10*3/uL (ref 0.0–0.1)
Basophils Relative: 1 %
Eosinophils Absolute: 0 10*3/uL (ref 0.0–0.5)
Eosinophils Relative: 1 %
HCT: 30.7 % — ABNORMAL LOW (ref 39.0–52.0)
Hemoglobin: 10.2 g/dL — ABNORMAL LOW (ref 13.0–17.0)
Immature Granulocytes: 1 %
Lymphocytes Relative: 8 %
Lymphs Abs: 0.5 10*3/uL — ABNORMAL LOW (ref 0.7–4.0)
MCH: 30.5 pg (ref 26.0–34.0)
MCHC: 33.2 g/dL (ref 30.0–36.0)
MCV: 91.9 fL (ref 80.0–100.0)
Monocytes Absolute: 0.5 10*3/uL (ref 0.1–1.0)
Monocytes Relative: 9 %
Neutro Abs: 4.7 10*3/uL (ref 1.7–7.7)
Neutrophils Relative %: 80 %
Platelet Count: 205 10*3/uL (ref 150–400)
RBC: 3.34 MIL/uL — ABNORMAL LOW (ref 4.22–5.81)
RDW: 16.5 % — ABNORMAL HIGH (ref 11.5–15.5)
WBC Count: 5.8 10*3/uL (ref 4.0–10.5)
nRBC: 0 % (ref 0.0–0.2)

## 2019-07-01 LAB — CMP (CANCER CENTER ONLY)
ALT: 19 U/L (ref 0–44)
AST: 15 U/L (ref 15–41)
Albumin: 3.5 g/dL (ref 3.5–5.0)
Alkaline Phosphatase: 84 U/L (ref 38–126)
Anion gap: 7 (ref 5–15)
BUN: 12 mg/dL (ref 8–23)
CO2: 26 mmol/L (ref 22–32)
Calcium: 9.3 mg/dL (ref 8.9–10.3)
Chloride: 103 mmol/L (ref 98–111)
Creatinine: 1.22 mg/dL (ref 0.61–1.24)
GFR, Est AFR Am: >60 mL/min (ref >60)
GFR, Estimated: >60 mL/min (ref >60)
Glucose, Bld: 132 mg/dL — ABNORMAL HIGH (ref 70–99)
Potassium: 3.7 mmol/L (ref 3.5–5.1)
Sodium: 136 mmol/L (ref 135–145)
Total Bilirubin: 0.4 mg/dL (ref 0.3–1.2)
Total Protein: 5.9 g/dL — ABNORMAL LOW (ref 6.5–8.1)

## 2019-07-01 LAB — LACTATE DEHYDROGENASE: LDH: 171 U/L (ref 98–192)

## 2019-07-01 MED ORDER — PALONOSETRON HCL INJECTION 0.25 MG/5ML
0.2500 mg | Freq: Once | INTRAVENOUS | Status: AC
  Administered 2019-07-01: 11:00:00 0.25 mg via INTRAVENOUS

## 2019-07-01 MED ORDER — ALTEPLASE 2 MG IJ SOLR
2.0000 mg | Freq: Once | INTRAMUSCULAR | Status: DC | PRN
  Filled 2019-07-01: qty 2

## 2019-07-01 MED ORDER — SODIUM CHLORIDE 0.9 % IV SOLN
Freq: Once | INTRAVENOUS | Status: AC
  Administered 2019-07-01: 11:00:00 via INTRAVENOUS
  Filled 2019-07-01: qty 250

## 2019-07-01 MED ORDER — TEMAZEPAM 30 MG PO CAPS: 30.0000 mg | ORAL_CAPSULE | Freq: Every evening | ORAL | 2 refills | Status: DC | PRN

## 2019-07-01 MED ORDER — HEPARIN SOD (PORK) LOCK FLUSH 100 UNIT/ML IV SOLN
500.0000 [IU] | Freq: Once | INTRAVENOUS | Status: AC | PRN
  Administered 2019-07-01: 14:00:00 500 [IU]
  Filled 2019-07-01: qty 5

## 2019-07-01 MED ORDER — SODIUM CHLORIDE 0.9 % IV SOLN
Freq: Once | INTRAVENOUS | Status: DC
  Filled 2019-07-01: qty 250

## 2019-07-01 MED ORDER — DEXAMETHASONE SODIUM PHOSPHATE 10 MG/ML IJ SOLN
10.0000 mg | Freq: Once | INTRAMUSCULAR | Status: AC
  Administered 2019-07-01: 11:00:00 10 mg via INTRAVENOUS

## 2019-07-01 MED ORDER — SODIUM CHLORIDE 0.9% FLUSH
10.0000 mL | INTRAVENOUS | Status: DC | PRN
  Administered 2019-07-01: 14:00:00 10 mL
  Filled 2019-07-01: qty 10

## 2019-07-01 MED ORDER — SODIUM CHLORIDE 0.9 % IV SOLN
2.5600 mg/m2 | Freq: Once | INTRAVENOUS | Status: AC
  Administered 2019-07-01: 5.35 mg via INTRAVENOUS
  Filled 2019-07-01: qty 10.7

## 2019-07-01 MED ORDER — PALONOSETRON HCL INJECTION 0.25 MG/5ML
INTRAVENOUS | Status: AC
  Filled 2019-07-01: qty 5

## 2019-07-01 MED ORDER — DEXAMETHASONE SODIUM PHOSPHATE 10 MG/ML IJ SOLN
INTRAMUSCULAR | Status: AC
  Filled 2019-07-01: qty 1

## 2019-07-01 MED FILL — TEMAZEPAM 30 MG CAPSULE: 30 | 30 days supply | Qty: 30 | Fill #0

## 2019-07-01 NOTE — Patient Instructions (Signed)
Implanted [facility] Guide An implanted port is a device that is placed under the skin. It is usually placed in the chest. The device can be used to give IV medicine, to take blood, or for dialysis. You may have an implanted port if:  You need IV medicine that would be irritating to the small veins in your hands or arms.  You need IV medicines, such as antibiotics, for a long period of time.  You need IV nutrition for a long period of time.  You need dialysis. Having a port means that your health care provider will not need to use the veins in your arms for these procedures. You may have fewer limitations when using a port than you would if you used other types of long-term IVs, and you will likely be able to return to normal activities after your incision heals. An implanted port has two main parts:  Reservoir. The reservoir is the part where a needle is inserted to give medicines or draw blood. The reservoir is round. After it is placed, it appears as a small, raised area under your skin.  Catheter. The catheter is a thin, flexible tube that connects the reservoir to a vein. Medicine that is inserted into the reservoir goes into the catheter and then into the vein. How is my port accessed? To access your port:  A numbing cream may be placed on the skin over the port site.  Your health care provider will put on a mask and sterile gloves.  The skin over your port will be cleaned carefully with a germ-killing soap and allowed to dry.  Your health care provider will gently pinch the port and insert a needle into it.  Your health care provider will check for a blood return to make sure the port is in the vein and is not clogged.  If your port needs to remain accessed to get medicine continuously (constant infusion), your health care provider will place a clear bandage (dressing) over the needle site. The dressing and needle will need to be changed every week, or as told by your health care  provider. What is flushing? Flushing helps keep the port from getting clogged. Follow instructions from your health care provider about how and when to flush the port. Ports are usually flushed with saline solution or a medicine called heparin. The need for flushing will depend on how the port is used:  If the port is only used from time to time to give medicines or draw blood, the port may need to be flushed: ? Before and after medicines have been given. ? Before and after blood has been drawn. ? As part of routine maintenance. Flushing may be recommended every 4-6 weeks.  If a constant infusion is running, the port may not need to be flushed.  Throw away any syringes in a disposal container that is meant for sharp items (sharps container). You can buy a sharps container from a pharmacy, or you can make one by using an empty hard plastic bottle with a cover. How long will my port stay implanted? The port can stay in for as long as your health care provider thinks it is needed. When it is time for the port to come out, a surgery will be done to remove it. The surgery will be similar to the procedure that was done to put the port in. Follow these instructions at home:   Flush your port as told by your health care provider.  If you need an infusion over several days, follow instructions from your health care provider about how to take care of your port site. Make sure you: ? Wash your hands with soap and water before you change your dressing. If soap and water are not available, use alcohol-based hand sanitizer. ? Change your dressing as told by your health care provider. ? Place any used dressings or infusion bags into a plastic bag. Throw that bag in the trash. ? Keep the dressing that covers the needle clean and dry. Do not get it wet. ? Do not use scissors or sharp objects near the tube. ? Keep the tube clamped, unless it is being used.  Check your port site every day for signs of  infection. Check for: ? Redness, swelling, or pain. ? Fluid or blood. ? Pus or a bad smell.  Protect the skin around the port site. ? Avoid wearing bra straps that rub or irritate the site. ? Protect the skin around your port from seat belts. Place a soft pad over your chest if needed.  Bathe or shower as told by your health care provider. The site may get wet as long as you are not actively receiving an infusion.  Return to your normal activities as told by your health care provider. Ask your health care provider what activities are safe for you.  Carry a medical alert card or wear a medical alert bracelet at all times. This will let health care providers know that you have an implanted port in case of an emergency. Get help right away if:  You have redness, swelling, or pain at the port site.  You have fluid or blood coming from your port site.  You have pus or a bad smell coming from the port site.  You have a fever. Summary  Implanted ports are usually placed in the chest for long-term IV access.  Follow instructions from your health care provider about flushing the port and changing bandages (dressings).  Take care of the area around your port by avoiding clothing that puts pressure on the area, and by watching for signs of infection.  Protect the skin around your port from seat belts. Place a soft pad over your chest if needed.  Get help right away if you have a fever or you have redness, swelling, pain, drainage, or a bad smell at the port site. This information is not intended to replace advice given to you by your health care provider. Make sure you discuss any questions you have with your health care provider. Document Released: 07/16/2005 Document Revised: 11/07/2018 Document Reviewed: 08/18/2016 Elsevier Patient Education  2020 [employer].

## 2019-07-01 NOTE — Progress Notes (Signed)
1320-OK to increase NS rate to 999 ml/hr to complete one liter of NS per order of Dr. Marin Olp.

## 2019-07-01 NOTE — Progress Notes (Signed)
Hematology and Oncology Follow Up Visit  Taylor Elliott [id number] [birth year] 68 y.o. 07/01/2019   Principle Diagnosis:   Extensive stage small cell lung cancer  SIADH secondary to small cell lung cancer  Current Therapy:           Carboplatinum/etoposide/Tecentriq-cycle #6  Atezolizumab 1200 mg IV q 3 week -- maintenance -  Start 01/21/2019  XRT for CNS mets -- completed on 03/27/2019  Lurbinectedin -- s/p cycle #2 -- started on 05/07/2019     Interim History:  Taylor Elliott is back for follow-up.  Thankfully, he looks much better.  He had a wonderful 40th anniversary celebration with his family.  He and his wife really enjoyed themselves.    He just looks a lot better now.  His renal function is back to normal.,  Sure if this was the chemotherapy or if this was one of his other medications.  We stopped a lot of his medications.  His blood pressure is doing much better.  His appetite seems to be getting little bit better.  His weight is up 1 pound.  He has had no problems with nausea or vomiting.  He has had no diarrhea.  He is still smoking.  Hopefully, he will be able to cut back a little bit now that his renal function is better.  He had a complete work-up for the renal insufficiency.  He had a ultrasound of his abdomen.  There is no hydronephrosis.  He had a CT of the abdomen which did not show any hydronephrosis.  He is seen nephrology.  As always, they are doing a fantastic job with him.  There is been no problems with bleeding.  He has had no headache.  Overall, I would have to say that his performance status is ECOG 1.    Medications:  Current Outpatient Medications:  .  aspirin EC 81 MG tablet, Take 81 mg by mouth daily., Disp: , Rfl:  .  brimonidine (ALPHAGAN) 0.2 % ophthalmic solution, Place 1 drop into the right eye 2 (two) times daily., Disp: 5 mL, Rfl: 12 .  calcium carbonate (TUMS - DOSED IN MG ELEMENTAL CALCIUM) 500 MG chewable tablet, Chew 2 tablets by  mouth daily as needed for indigestion or heartburn. , Disp: , Rfl:  .  dexamethasone (DECADRON) 4 MG tablet, Take 2 tablets (8 mg total) by mouth daily. Start the day after chemotherapy for 2 days. Take with food., Disp: 30 tablet, Rfl: 1 .  fenofibrate 160 MG tablet, TAKE 1 TABLET (160 MG TOTAL) BY MOUTH DAILY., Disp: 90 tablet, Rfl: 1 .  HYDROcodone-homatropine (HYCODAN) 5-1.5 MG/5ML syrup, Take 5 mLs by mouth every 4 (four) hours as needed for cough., Disp: 300 mL, Rfl: 0 .  lactulose (CHRONULAC) 10 GM/15ML solution, Take 15 mLs (10 g total) by mouth 2 (two) times daily as needed for mild constipation., Disp: 473 mL, Rfl: 1 .  lidocaine-prilocaine (EMLA) cream, Apply to affected area once, Disp: 30 g, Rfl: 3 .  LORazepam (ATIVAN) 1 MG tablet, Take 1 tablet (1 mg total) by mouth every 6 (six) hours as needed for anxiety., Disp: 60 tablet, Rfl: 0 .  Multiple Vitamin (MULTIVITAMIN) tablet, Take 1 tablet by mouth daily., Disp: , Rfl:  .  pyridoxine (B-6) 200 MG tablet, Take 200 mg by mouth daily., Disp: , Rfl:  .  solifenacin (VESICARE) 10 MG tablet, Take 10 mg by mouth every evening., Disp: , Rfl:  .  temazepam (RESTORIL) 30 MG capsule, Take  1 capsule (30 mg total) by mouth at bedtime as needed for sleep., Disp: 30 capsule, Rfl: 2 .  UNABLE TO FIND, Med Name: Allergy shots., Disp: , Rfl:  .  amoxicillin (AMOXIL) 500 MG tablet, Take 4 tablets (2,000 mg total) by mouth once as needed for up to 1 dose. Take one hour prior to dental procedure (Patient not taking: Reported on 07/01/2019), Disp: 4 tablet, Rfl: 3 .  dronabinol (MARINOL) 5 MG capsule, Take 1 capsule (5 mg total) by mouth 2 (two) times daily before a meal. (Patient not taking: Reported on 06/17/2019), Disp: 60 capsule, Rfl: 1 .  EPINEPHrine 0.3 mg/0.3 mL IJ SOAJ injection, Inject 0.3 mg into the muscle once., Disp: , Rfl:  .  ondansetron (ZOFRAN) 8 MG tablet, Take 1 tablet (8 mg total) by mouth 2 (two) times daily as needed for refractory  nausea / vomiting. Start on day 3 after chemotherapy. (Patient not taking: Reported on 07/01/2019), Disp: 30 tablet, Rfl: 1 .  prochlorperazine (COMPAZINE) 10 MG tablet, Take 1 tablet (10 mg total) by mouth every 6 (six) hours as needed (Nausea or vomiting). (Patient not taking: Reported on 06/17/2019), Disp: 30 tablet, Rfl: 1  Allergies:  Allergies  Allergen Reactions  . Bee Venom Anaphylaxis  . Clindamycin/Lincomycin Hives    Past Medical History, Surgical history, Social history, and Family History were reviewed and updated.  Review of Systems: Review of Systems  Constitutional: Negative.   HENT:  Negative.   Eyes: Negative.   Respiratory: Positive for cough.   Cardiovascular: Negative.   Gastrointestinal: Negative.   Endocrine: Negative.   Genitourinary: Negative.    Musculoskeletal: Positive for back pain, myalgias and neck pain.  Skin: Positive for itching and rash.  Neurological: Negative.   Hematological: Negative.   Psychiatric/Behavioral: Negative.     Physical Exam:  vitals were not taken for this visit.   Wt Readings from Last 3 Encounters:  07/01/19 179 lb (81.2 kg)  06/17/19 178 lb (80.7 kg)  05/27/19 196 lb (88.9 kg)    Physical Exam Vitals signs reviewed.  HENT:     Head: Normocephalic and atraumatic.  Eyes:     Pupils: Pupils are equal, round, and reactive to light.  Neck:     Musculoskeletal: Normal range of motion.  Cardiovascular:     Rate and Rhythm: Normal rate and regular rhythm.     Heart sounds: Normal heart sounds.  Pulmonary:     Effort: Pulmonary effort is normal.     Breath sounds: Normal breath sounds.  Abdominal:     General: Bowel sounds are normal.     Palpations: Abdomen is soft.     Comments: Abdominal exam shows a slightly obese abdomen.  He does have an umbilical hernia.  I really cannot palpate his liver at this point.  There is no fluid wave.  There is no inguinal adenopathy.  There is no splenomegaly.    Musculoskeletal:  Normal range of motion.        General: No tenderness or deformity.  Lymphadenopathy:     Cervical: No cervical adenopathy.  Skin:    General: Skin is warm and dry.     Findings: No erythema or rash.  Neurological:     Mental Status: He is alert and oriented to person, place, and time.  Psychiatric:        Behavior: Behavior normal.        Thought Content: Thought content normal.  Judgment: Judgment normal.      Lab Results  Component Value Date   WBC 5.8 07/01/2019   HGB 10.2 (L) 07/01/2019   HCT 30.7 (L) 07/01/2019   MCV 91.9 07/01/2019   PLT 205 07/01/2019     Chemistry      Component Value Date/Time   NA 138 06/24/2019 1100   NA 135 (L) 07/12/2015 0813   K 4.0 06/24/2019 1100   K 4.5 07/12/2015 0813   CL 105 06/24/2019 1100   CO2 25 06/24/2019 1100   CO2 24 07/12/2015 0813   BUN 22 06/24/2019 1100   BUN 15.7 07/12/2015 0813   CREATININE 1.83 (H) 06/24/2019 1100   CREATININE 2.74 (H) 06/19/2019 0900   CREATININE 1.6 (H) 07/12/2015 0813      Component Value Date/Time   CALCIUM 9.0 06/24/2019 1100   CALCIUM 10.1 07/12/2015 0813   ALKPHOS 100 06/24/2019 1100   ALKPHOS 115 07/12/2015 0813   AST 19 06/24/2019 1100   AST 25 06/19/2019 0900   AST 22 07/12/2015 0813   ALT 28 06/24/2019 1100   ALT 39 06/19/2019 0900   ALT 24 07/12/2015 0813   BILITOT 0.5 06/24/2019 1100   BILITOT 0.5 06/19/2019 0900   BILITOT 0.46 07/12/2015 0813       Impression and Plan: Taylor Elliott is a 68 year old white male.  He has a past history of renal cell carcinoma and also prostate cancer.  Now, he has a third malignancy.  This is metastatic small cell lung cancer.  We will go ahead with his chemotherapy.  I feel much better given him chemotherapy now that his renal function is better and his blood pressure is better.  IV fluids definitely help him out.  I will make sure that he comes in weekly for IV fluids.  His sodium is stable.  There is no evidence that he is  developing SIADH.  I know this is been quite complicated and quite stressful for he and his wife.  We will probably plan for another PET scan after his fourth cycle of treatment.    We will have him come back in 3 weeks for his fourth cycle.       Volanda Napoleon, MD 12/2/202010:28 AM

## 2019-07-02 ENCOUNTER — Ambulatory Visit
Admission: RE | Admit: 2019-07-02 | Discharge: 2019-07-02 | Disposition: A | Discharge status: Home / Self Care | Payer: 59 | Source: Ambulatory Visit | Attending: Radiation Oncology | Admitting: Radiation Oncology

## 2019-07-02 ENCOUNTER — Encounter: Payer: Self-pay | Admitting: Radiation Oncology

## 2019-07-02 ENCOUNTER — Other Ambulatory Visit: Payer: Self-pay

## 2019-07-02 VITALS — BP 158/74 | HR 109 | Temp 98.7°F | Resp 18 | Ht 68.0 in | Wt 183.1 lb

## 2019-07-02 DIAGNOSIS — R590 Localized enlarged lymph nodes: Secondary | ICD-10-CM | POA: Insufficient documentation

## 2019-07-02 DIAGNOSIS — C3401 Malignant neoplasm of right main bronchus: Secondary | ICD-10-CM | POA: Insufficient documentation

## 2019-07-02 DIAGNOSIS — N1330 Unspecified hydronephrosis: Secondary | ICD-10-CM | POA: Diagnosis not present

## 2019-07-02 DIAGNOSIS — Z87891 Personal history of nicotine dependence: Secondary | ICD-10-CM | POA: Diagnosis not present

## 2019-07-02 DIAGNOSIS — Z85841 Personal history of malignant neoplasm of brain: Secondary | ICD-10-CM | POA: Diagnosis not present

## 2019-07-02 DIAGNOSIS — I700 Atherosclerosis of aorta: Secondary | ICD-10-CM | POA: Insufficient documentation

## 2019-07-02 DIAGNOSIS — C7931 Secondary malignant neoplasm of brain: Secondary | ICD-10-CM | POA: Diagnosis not present

## 2019-07-02 DIAGNOSIS — Z08 Encounter for follow-up examination after completed treatment for malignant neoplasm: Secondary | ICD-10-CM | POA: Diagnosis not present

## 2019-07-02 DIAGNOSIS — Z7982 Long term (current) use of aspirin: Secondary | ICD-10-CM | POA: Diagnosis not present

## 2019-07-02 DIAGNOSIS — Z923 Personal history of irradiation: Secondary | ICD-10-CM | POA: Insufficient documentation

## 2019-07-02 DIAGNOSIS — Z79899 Other long term (current) drug therapy: Secondary | ICD-10-CM | POA: Insufficient documentation

## 2019-07-02 DIAGNOSIS — C3490 Malignant neoplasm of unspecified part of unspecified bronchus or lung: Secondary | ICD-10-CM | POA: Diagnosis not present

## 2019-07-02 DIAGNOSIS — C7949 Secondary malignant neoplasm of other parts of nervous system: Secondary | ICD-10-CM

## 2019-07-02 NOTE — Patient Instructions (Signed)
Coronavirus (COVID-19) Are you at risk?  Are you at risk for the Coronavirus (COVID-19)?  To be considered HIGH RISK for Coronavirus (COVID-19), you have to meet the following criteria:  . Traveled to [place], [place], [place], [place] or [place]; or in the [place] to [place], [place], [place], or [place]; and have fever, cough, and shortness of breath within the last 2 weeks of travel OR . Been in close contact with a person diagnosed with COVID-19 within the last 2 weeks and have fever, cough, and shortness of breath . IF YOU DO NOT MEET THESE CRITERIA, YOU ARE CONSIDERED LOW RISK FOR COVID-19.  What to do if you are HIGH RISK for COVID-19?  . If you are having a medical emergency, call 911. . Seek medical care right away. Before you go to a doctor's office, urgent care or emergency department, call ahead and tell them about your recent travel, contact with someone diagnosed with COVID-19, and your symptoms. You should receive instructions from your physician's office regarding next steps of care.  . When you arrive at healthcare provider, tell the healthcare staff immediately you have returned from visiting [place], [place], [place], [place] or [place]; or traveled in the [place] to [place], [place], [place], or [place]; in the last two weeks or you have been in close contact with a person diagnosed with COVID-19 in the last 2 weeks.   . Tell the health care staff about your symptoms: fever, cough and shortness of breath. . After you have been seen by a medical provider, you will be either: o Tested for (COVID-19) and discharged home on quarantine except to seek medical care if symptoms worsen, and asked to  - Stay home and avoid contact with others until you get your results (4-5 days)  - Avoid travel on public transportation if possible (such as bus, train, or airplane) or o Sent to the Emergency Department by EMS for evaluation, COVID-19 testing, and possible  admission depending on your condition and test results.  What to do if you are LOW RISK for COVID-19?  Reduce your risk of any infection by using the same precautions used for avoiding the common cold or flu:  . Wash your hands often with soap and warm water for at least 20 seconds.  If soap and water are not readily available, use an alcohol-based hand sanitizer with at least 60% alcohol.  . If coughing or sneezing, cover your mouth and nose by coughing or sneezing into the elbow areas of your shirt or coat, into a tissue or into your sleeve (not your hands). . Avoid shaking hands with others and consider head nods or verbal greetings only. . Avoid touching your eyes, nose, or mouth with unwashed hands.  . Avoid close contact with people who are sick. . Avoid places or events with large numbers of people in one location, like concerts or sporting events. . Carefully consider travel plans you have or are making. . If you are planning any travel outside or inside the [place], visit the CDC's Travelers' Health webpage for the latest health notices. . If you have some symptoms but not all symptoms, continue to monitor at home and seek medical attention if your symptoms worsen. . If you are having a medical emergency, call 911.   ADDITIONAL HEALTHCARE OPTIONS FOR PATIENTS  [facility] / e-Visit: [email or web address]         [facility]: [phone]  [facility]: [phone]                   [facility]: [phone]   

## 2019-07-02 NOTE — Progress Notes (Signed)
Radiation Oncology         [phone] ________________________________  Name: Taylor Elliott MRN: [id number]  Date: 07/02/2019  DOB: [birth year]  Follow-Up Visit Note  CC: Ann Held, DO  [name], MD    ICD-10-CM   1. Secondary malignant neoplasm of brain and spinal cord [facility])  C79.31    C79.49     Diagnosis:   Extensive stage small cell lung cancer  Interval Since Last Radiation:  3 months, 1 week 03/10/2019 through 03/27/2019 Site Technique Total Dose Dose per Fx Completed Fx Beam Energies  Brain: Brain Complex 35/35  Gy 2.5 14/14 6X   Narrative:  The patient returns today for routine follow-up.   Since treatment completion, he underwent follow up brain MRI on 05/20/2019, which showed no evidence for residual or recurrent metastatic disease.  He also underwent PET scan on 05/01/2019 that unfortunately showed evidence of lung cancer recurrence with new metastatic disease. He was started on Lurbinectedin on 05/07/2019. On follow up PET scan performed 06/08/2019, improvement was seen in the right hilum, right paratracheal and subcarinal lymph nodes, and right liver.  On review of systems, he feeling better after being discharged from the hospital for his renal issues.  He denies any difficulties with breathing,  pain within the chest area or hemoptysis.  He denies any headaches  ALLERGIES:  is allergic to bee venom and clindamycin/lincomycin.  Meds: Current Outpatient Medications  Medication Sig Dispense Refill   aspirin EC 81 MG tablet Take 81 mg by mouth daily.     brimonidine (ALPHAGAN) 0.2 % ophthalmic solution Place 1 drop into the right eye 2 (two) times daily. 5 mL 12   calcium carbonate (TUMS - DOSED IN MG ELEMENTAL CALCIUM) 500 MG chewable tablet Chew 2 tablets by mouth daily as needed for indigestion or heartburn.      fenofibrate 160 MG tablet TAKE 1 TABLET (160 MG TOTAL) BY MOUTH DAILY. 90 tablet 1   HYDROcodone-homatropine (HYCODAN) 5-1.5  MG/5ML syrup Take 5 mLs by mouth every 4 (four) hours as needed for cough. 300 mL 0   lactulose (CHRONULAC) 10 GM/15ML solution Take 15 mLs (10 g total) by mouth 2 (two) times daily as needed for mild constipation. 473 mL 1   lidocaine-prilocaine (EMLA) cream Apply to affected area once 30 g 3   LORazepam (ATIVAN) 1 MG tablet Take 1 tablet (1 mg total) by mouth every 6 (six) hours as needed for anxiety. 60 tablet 0   Multiple Vitamin (MULTIVITAMIN) tablet Take 1 tablet by mouth daily.     ondansetron (ZOFRAN) 8 MG tablet Take 1 tablet (8 mg total) by mouth 2 (two) times daily as needed for refractory nausea / vomiting. Start on day 3 after chemotherapy. 30 tablet 1   pyridoxine (B-6) 200 MG tablet Take 200 mg by mouth daily.     solifenacin (VESICARE) 10 MG tablet Take 10 mg by mouth every evening.     temazepam (RESTORIL) 30 MG capsule Take 1 capsule (30 mg total) by mouth at bedtime as needed for sleep. 30 capsule 2   amoxicillin (AMOXIL) 500 MG tablet Take 4 tablets (2,000 mg total) by mouth once as needed for up to 1 dose. Take one hour prior to dental procedure (Patient not taking: Reported on 07/01/2019) 4 tablet 3   dronabinol (MARINOL) 5 MG capsule Take 1 capsule (5 mg total) by mouth 2 (two) times daily before a meal. (Patient not taking: Reported on 06/17/2019) 60  capsule 1   EPINEPHrine 0.3 mg/0.3 mL IJ SOAJ injection Inject 0.3 mg into the muscle once.     prochlorperazine (COMPAZINE) 10 MG tablet Take 1 tablet (10 mg total) by mouth every 6 (six) hours as needed (Nausea or vomiting). (Patient not taking: Reported on 06/17/2019) 30 tablet 1   UNABLE TO FIND Med Name: Allergy shots.     No current facility-administered medications for this encounter.     Physical Findings: The patient is in no acute distress. Patient is alert and oriented.   height is 5\' 8"  (1.727 m) and weight is 183 lb 2 oz (83.1 kg). His temporal temperature is 98.7 F (37.1 C). His blood pressure is  158/74 (abnormal) and his pulse is 109 (abnormal). His respiration is 18 and oxygen saturation is 100%. .  Lungs are clear to auscultation bilaterally. Heart has regular rate and rhythm. No palpable cervical, supraclavicular, or axillary adenopathy. Abdomen soft, non-tender, normal bowel sounds. Scalp is healed well.   Lab Findings: Lab Results  Component Value Date   WBC 5.8 07/01/2019   HGB 10.2 (L) 07/01/2019   HCT 30.7 (L) 07/01/2019   MCV 91.9 07/01/2019   PLT 205 07/01/2019    Radiographic Findings: US Abdomen Complete  Result Date: 06/19/2019 CLINICAL DATA:  Decreased renal function EXAM: ABDOMEN ULTRASOUND COMPLETE COMPARISON:  Pet CT 06/08/2019 FINDINGS: Gallbladder: No gallstones or wall thickening visualized. No sonographic Murphy sign noted by sonographer. Common bile duct: Diameter: 2 mm Liver: Heterogeneous, nodular echotexture suspicious for cirrhotic change. Multiple hypoechoic masses consistent with metastatic disease. For example right posterior hepatic lobe mass lesion measures 3.4 cm. Left hepatic lobe mass lesion measures 1.7 cm. Portal vein is patent on color Doppler imaging with normal direction of blood flow towards the liver. IVC: No abnormality visualized. Pancreas: Visualized portion unremarkable. Spleen: Size and appearance within normal limits. Right Kidney: Surgically absent Left Kidney: Length: 12.6 cm. Cortical echogenicity within normal limits. Mild left hydronephrosis. No mass. Abdominal aorta: No aneurysm visualized. Other findings: None. IMPRESSION: 1. Status post right nephrectomy. 2. Mild hydronephrosis of the left kidney 3. Heterogeneous nodular appearing liver, possible cirrhosis. Multiple hypoechoic masses consistent with metastatic disease. Electronically Signed   By: Donavan Foil M.D.   On: 06/19/2019 19:43   Nm Pet Image Restag (ps) Skull Base To Thigh  Result Date: 06/08/2019 CLINICAL DATA:  Subsequent. Treatment strategy for small-cell lung cancer.  EXAM: NUCLEAR MEDICINE PET SKULL BASE TO THIGH TECHNIQUE: 9.5 mCi F-18 FDG was injected intravenously. Full-ring PET imaging was performed from the skull base to thigh after the radiotracer. CT data was obtained and used for attenuation correction and anatomic localization. Fasting blood glucose: 88 mg/dl COMPARISON:  05/01/2019 FINDINGS: Mediastinal blood pool activity: SUV max 2.8 Liver activity: SUV max NA NECK: No hypermetabolic lymph nodes in the neck. Incidental CT findings: none CHEST: Interval decrease in hypermetabolism seen previously at the right hilum, SUV max = 7.5 today compared to 14.7 previously. Abnormal soft tissue difficult to measure given lack of intravenous contrast but appearing decreased at approximately 2.0 x 2.1 cm today compared to 3.2 x 2.3 cm previously. Infrahilar hypermetabolism decreased with SUV max = 6.4 today compared to 14.7 previously Hypermetabolic subcarinal node decreased with SUV max = 4.9 today. This was unmeasured previously. Subcarinal lymph node measures 5 mm short axis today compared to 13 mm when I remeasure on the prior exam. Hypermetabolic right paratracheal node identified as new on the prior study has resolved in the interval,  measuring only 2-3 mm short axis today without hypermetabolic activity evident. No new hypermetabolism identified in the thorax. Incidental CT findings: Atherosclerotic calcification is noted in the wall of the thoracic aorta. Right Port-A-Cath tip is positioned at the SVC/RA junction. Centrilobular emphsyema noted. Tiny 2-3 mm right upper lobe nodule (57/4) is unchanged. ABDOMEN/PELVIS: Hypermetabolic liver metastases again noted. Posterior right hepatic lesion measured previously at 2.9 cm is subtle and measures 1.8 cm today (93/4). However, hypermetabolism in this lesion is increased with SUV max = 16.0 today compared to 13.6 previously. Index lesion in the lateral segment left liver measured previously at 1.2 cm is not visible on  noncontrast CT imaging today. SUV max = 8.2 on today's exam which is stable from 8.1 previously. 8 mm subtle hypoattenuating lesion at the gallbladder fossa (103/4) has decreased from 1.8 cm previously. SUV max = 7.7 today compared to 9.2 previously. Additional scattered tiny hypermetabolic liver lesions seen on PET imaging previously (but inapparent on noncontrast CT) have resolved in the interval. Hypermetabolic activity in the left adrenal gland noted previously has decreased in the interval with SUV max = 3.5 today compared to 5.1 previously. No underlying adrenal mass evident by CT. No hypermetabolic lymphadenopathy in the abdomen/pelvis. Incidental CT findings: Right nephrectomy. There is abdominal aortic atherosclerosis without aneurysm. Umbilical hernia contains only fat. Irregular circumferential bladder wall thickening noted, accentuated by underdistention. Status post prostatectomy. SKELETON: The hypermetabolic T7 vertebral body lesion is similar with SUV max = 5.0 today compared to 4.3 previously. The hypermetabolic right iliac wing lesion noted on the prior study with SUV max = 4.0 demonstrates SUV max = 2.5 today. Incidental CT findings: Numerous sclerotic lesions are seen scattered throughout the bony anatomy. IMPRESSION: 1. Interval decrease in size and hypermetabolism of disease in the right hilum with decreased hypermetabolism and size of right paratracheal and subcarinal lymphadenopathy. No new hypermetabolic disease in the thorax. 2. Dominant posterior right hepatic lesion measures smaller today but shows slight increase in hypermetabolism. Remaining prominent hypermetabolic liver lesions are stable to slightly decreased in activity. Patient had several tiny hypermetabolic liver foci on the previous exam that are no longer evident on PET imaging. 3. Hypermetabolic bone lesions in the T7 vertebral body and right iliac wing show no substantial change. No new hypermetabolic bone disease. 4. Slight  decrease in hypermetabolism associated with the left adrenal gland. Electronically Signed   By: Misty Stanley M.D.   On: 06/08/2019 11:20    Impression: Small cell lung cancer now undergoing chemotherapy for recurrence.  Most recent brain scan shows no evidence of recurrence within the brain region.  Plan: As needed follow-up in radiation oncology.  The patient continue close follow-up in medical oncology and continue on salvage therapy. ____________________________________ Gery Pray, MD   This document serves as a record of services personally performed by Gery Pray, MD. It was created on his behalf by Wilburn Mylar, a trained medical scribe. The creation of this record is based on the scribe's personal observations and the provider's statements to them. This document has been checked and approved by the attending provider.

## 2019-07-02 NOTE — Progress Notes (Signed)
Patient in for follow up doing well. Had a retinal tear that has been repaired. Still has a moist sounding cough. He lost all of his hair. Back on chemotherapy.Started 05-29-2019, Kidney issues are better.

## 2019-07-03 ENCOUNTER — Inpatient Hospital Stay: Payer: 59

## 2019-07-03 VITALS — BP 96/60 | HR 105 | Temp 97.1°F | Resp 20

## 2019-07-03 DIAGNOSIS — C3491 Malignant neoplasm of unspecified part of right bronchus or lung: Secondary | ICD-10-CM | POA: Diagnosis not present

## 2019-07-03 DIAGNOSIS — Z7982 Long term (current) use of aspirin: Secondary | ICD-10-CM | POA: Diagnosis not present

## 2019-07-03 DIAGNOSIS — Z8546 Personal history of malignant neoplasm of prostate: Secondary | ICD-10-CM | POA: Diagnosis not present

## 2019-07-03 DIAGNOSIS — L03119 Cellulitis of unspecified part of limb: Secondary | ICD-10-CM | POA: Diagnosis not present

## 2019-07-03 DIAGNOSIS — Z5111 Encounter for antineoplastic chemotherapy: Secondary | ICD-10-CM | POA: Diagnosis not present

## 2019-07-03 DIAGNOSIS — E222 Syndrome of inappropriate secretion of antidiuretic hormone: Secondary | ICD-10-CM | POA: Diagnosis not present

## 2019-07-03 DIAGNOSIS — F17210 Nicotine dependence, cigarettes, uncomplicated: Secondary | ICD-10-CM | POA: Diagnosis not present

## 2019-07-03 DIAGNOSIS — N289 Disorder of kidney and ureter, unspecified: Secondary | ICD-10-CM | POA: Diagnosis not present

## 2019-07-03 DIAGNOSIS — Z79899 Other long term (current) drug therapy: Secondary | ICD-10-CM | POA: Diagnosis not present

## 2019-07-03 LAB — CBC WITH DIFFERENTIAL (CANCER CENTER ONLY)
Abs Immature Granulocytes: 0.05 10*3/uL (ref 0.00–0.07)
Basophils Absolute: 0 10*3/uL (ref 0.0–0.1)
Basophils Relative: 0 %
Eosinophils Absolute: 0 10*3/uL (ref 0.0–0.5)
Eosinophils Relative: 0 %
HCT: 28.7 % — ABNORMAL LOW (ref 39.0–52.0)
Hemoglobin: 9.7 g/dL — ABNORMAL LOW (ref 13.0–17.0)
Immature Granulocytes: 1 %
Lymphocytes Relative: 1 %
Lymphs Abs: 0.1 10*3/uL — ABNORMAL LOW (ref 0.7–4.0)
MCH: 31.4 pg (ref 26.0–34.0)
MCHC: 33.8 g/dL (ref 30.0–36.0)
MCV: 92.9 fL (ref 80.0–100.0)
Monocytes Absolute: 0.4 10*3/uL (ref 0.1–1.0)
Monocytes Relative: 5 %
Neutro Abs: 8 10*3/uL — ABNORMAL HIGH (ref 1.7–7.7)
Neutrophils Relative %: 93 %
Platelet Count: 222 10*3/uL (ref 150–400)
RBC: 3.09 MIL/uL — ABNORMAL LOW (ref 4.22–5.81)
RDW: 17.1 % — ABNORMAL HIGH (ref 11.5–15.5)
WBC Count: 8.6 10*3/uL (ref 4.0–10.5)
nRBC: 0 % (ref 0.0–0.2)

## 2019-07-03 LAB — CMP (CANCER CENTER ONLY)
ALT: 124 U/L — ABNORMAL HIGH (ref 0–44)
AST: 81 U/L — ABNORMAL HIGH (ref 15–41)
Albumin: 3.4 g/dL — ABNORMAL LOW (ref 3.5–5.0)
Alkaline Phosphatase: 67 U/L (ref 38–126)
Anion gap: 8 (ref 5–15)
BUN: 20 mg/dL (ref 8–23)
CO2: 24 mmol/L (ref 22–32)
Calcium: 8.8 mg/dL — ABNORMAL LOW (ref 8.9–10.3)
Chloride: 105 mmol/L (ref 98–111)
Creatinine: 1.32 mg/dL — ABNORMAL HIGH (ref 0.61–1.24)
GFR, Est AFR Am: >60 mL/min (ref >60)
GFR, Estimated: 55 mL/min — ABNORMAL LOW (ref >60)
Glucose, Bld: 173 mg/dL — ABNORMAL HIGH (ref 70–99)
Potassium: 4.1 mmol/L (ref 3.5–5.1)
Sodium: 137 mmol/L (ref 135–145)
Total Bilirubin: 0.4 mg/dL (ref 0.3–1.2)
Total Protein: 5.6 g/dL — ABNORMAL LOW (ref 6.5–8.1)

## 2019-07-03 MED ORDER — HEPARIN SOD (PORK) LOCK FLUSH 100 UNIT/ML IV SOLN
500.0000 [IU] | Freq: Once | INTRAVENOUS | Status: AC | PRN
  Administered 2019-07-03: 500 [IU]
  Filled 2019-07-03: qty 5

## 2019-07-03 MED ORDER — SODIUM CHLORIDE 0.9% FLUSH
10.0000 mL | Freq: Once | INTRAVENOUS | Status: AC | PRN
  Administered 2019-07-03: 10 mL
  Filled 2019-07-03: qty 10

## 2019-07-03 MED ORDER — SODIUM CHLORIDE 0.9 % IV SOLN
Freq: Once | INTRAVENOUS | Status: AC
  Administered 2019-07-03: 10:00:00 via INTRAVENOUS
  Filled 2019-07-03: qty 250

## 2019-07-03 NOTE — Patient Instructions (Signed)
Implanted Port Insertion, Care After This sheet gives you information about how to care for yourself after your procedure. Your health care provider may also give you more specific instructions. If you have problems or questions, contact your health care provider. What can I expect after the procedure? After the procedure, it is common to have:  Discomfort at the port insertion site.  Bruising on the skin over the port. This should improve over 3-4 days. Follow these instructions at home: Port care  After your port is placed, you will get a manufacturer's information card. The card has information about your port. Keep this card with you at all times.  Take care of the port as told by your health care provider. Ask your health care provider if you or a family member can get training for taking care of the port at home. A home health care nurse may also take care of the port.  Make sure to remember what type of port you have. Incision care      Follow instructions from your health care provider about how to take care of your port insertion site. Make sure you: ? Wash your hands with soap and water before and after you change your bandage (dressing). If soap and water are not available, use hand sanitizer. ? Change your dressing as told by your health care provider. ? Leave stitches (sutures), skin glue, or adhesive strips in place. These skin closures may need to stay in place for 2 weeks or longer. If adhesive strip edges start to loosen and curl up, you may trim the loose edges. Do not remove adhesive strips completely unless your health care provider tells you to do that.  Check your port insertion site every day for signs of infection. Check for: ? Redness, swelling, or pain. ? Fluid or blood. ? Warmth. ? Pus or a bad smell. Activity  Return to your normal activities as told by your health care provider. Ask your health care provider what activities are safe for you.  Do not  lift anything that is heavier than 10 lb (4.5 kg), or the limit that you are told, until your health care provider says that it is safe. General instructions  Take over-the-counter and prescription medicines only as told by your health care provider.  Do not take baths, swim, or use a hot tub until your health care provider approves. Ask your health care provider if you may take showers. You may only be allowed to take sponge baths.  Do not drive for 24 hours if you were given a sedative during your procedure.  Wear a medical alert bracelet in case of an emergency. This will tell any health care providers that you have a port.  Keep all follow-up visits as told by your health care provider. This is important. Contact a health care provider if:  You cannot flush your port with saline as directed, or you cannot draw blood from the port.  You have a fever or chills.  You have redness, swelling, or pain around your port insertion site.  You have fluid or blood coming from your port insertion site.  Your port insertion site feels warm to the touch.  You have pus or a bad smell coming from the port insertion site. Get help right away if:  You have chest pain or shortness of breath.  You have bleeding from your port that you cannot control. Summary  Take care of the port as told by your health   care provider. Keep the manufacturer's information card with you at all times.  Change your dressing as told by your health care provider.  Contact a health care provider if you have a fever or chills or if you have redness, swelling, or pain around your port insertion site.  Keep all follow-up visits as told by your health care provider. This information is not intended to replace advice given to you by your health care provider. Make sure you discuss any questions you have with your health care provider. Document Released: 05/06/2013 Document Revised: 02/11/2018 Document Reviewed: 02/11/2018  Elsevier Patient Education  2020 Elsevier Inc.  

## 2019-07-03 NOTE — Progress Notes (Signed)
10:29 AM Pt inquiring about taking Dexamaethasone, he does not like the way it makes him feel.  Dr. Marin Olp notified. OK not to take Dexamethasone. Pt notified, verbalized understanding.

## 2019-07-06 ENCOUNTER — Encounter: Payer: Self-pay | Admitting: *Deleted

## 2019-07-06 LAB — LACTATE DEHYDROGENASE: LDH: 225 U/L — ABNORMAL HIGH (ref 98–192)

## 2019-07-07 ENCOUNTER — Inpatient Hospital Stay: Payer: 59 | Admitting: Nutrition

## 2019-07-07 NOTE — Progress Notes (Signed)
68 year old male diagnosed with recurrent SCLC.  Principle Diagnosis:   Extensive stage small cell lung cancer  SIADH secondary to small cell lung cancer  Current Therapy:               Carboplatinum/etoposide/Tecentriq-cycle #6  Atezolizumab 1200 mg IV q 3 week -- maintenance -  Start 01/21/2019  XRT for CNS mets -- completed on 03/27/2019  Lurbinectedin -- s/p cycle #2 -- started on 05/07/2019  PMH includes Renal cell cancer, Prostate cancer, Osteomyelitis, HTN, SBO, GERD, COPD, and Anxiety  Medications include Decadron, Marinol, Lactulose, Ativan, MVI, Zofran, Compazine, Vitamin B6.  Labs include Glucose, 173, Creat 1.32, and Alb 3.4  Height: 5'8" Weight: 183 pounds, UBW: 207 pounds Sept 2020. BMI: 27.84  Patient reports appetite improved. He is eating well and has gained 5 pounds since mid November. He has decreased taste for meat. He is drinking one bottle boost high Protein daily. Denies nutrition impact symptoms.  Nutrition Diagnosis: Food and Nutrition related knowledge deficit related to recurrent cancer and associated treatments as evidenced by no prior need for nutrition related information.  Intervention: Educated patient to increase protein and calories to maintain weight. Reviewed high protein food sources. Educated on taste alterations. Mail fact sheets, coupons, and contact information.  Monitoring, Evaluation, Goals: Patient will tolerate adequate calories and protein for weight maintenance and to tolerate treatment.  Next Visit: Patient to contact me with questions.

## 2019-07-08 DIAGNOSIS — T63451D Toxic effect of venom of hornets, accidental (unintentional), subsequent encounter: Secondary | ICD-10-CM | POA: Diagnosis not present

## 2019-07-08 DIAGNOSIS — T63461D Toxic effect of venom of wasps, accidental (unintentional), subsequent encounter: Secondary | ICD-10-CM | POA: Diagnosis not present

## 2019-07-10 ENCOUNTER — Inpatient Hospital Stay: Payer: 59

## 2019-07-10 ENCOUNTER — Other Ambulatory Visit: Payer: Self-pay

## 2019-07-10 VITALS — BP 110/60 | HR 96 | Temp 97.8°F | Resp 19

## 2019-07-10 DIAGNOSIS — Z8546 Personal history of malignant neoplasm of prostate: Secondary | ICD-10-CM | POA: Diagnosis not present

## 2019-07-10 DIAGNOSIS — E222 Syndrome of inappropriate secretion of antidiuretic hormone: Secondary | ICD-10-CM | POA: Diagnosis not present

## 2019-07-10 DIAGNOSIS — L03119 Cellulitis of unspecified part of limb: Secondary | ICD-10-CM | POA: Diagnosis not present

## 2019-07-10 DIAGNOSIS — C3491 Malignant neoplasm of unspecified part of right bronchus or lung: Secondary | ICD-10-CM | POA: Diagnosis not present

## 2019-07-10 DIAGNOSIS — Z5111 Encounter for antineoplastic chemotherapy: Secondary | ICD-10-CM | POA: Diagnosis not present

## 2019-07-10 DIAGNOSIS — F17210 Nicotine dependence, cigarettes, uncomplicated: Secondary | ICD-10-CM | POA: Diagnosis not present

## 2019-07-10 DIAGNOSIS — N289 Disorder of kidney and ureter, unspecified: Secondary | ICD-10-CM | POA: Diagnosis not present

## 2019-07-10 DIAGNOSIS — Z7982 Long term (current) use of aspirin: Secondary | ICD-10-CM | POA: Diagnosis not present

## 2019-07-10 DIAGNOSIS — Z79899 Other long term (current) drug therapy: Secondary | ICD-10-CM | POA: Diagnosis not present

## 2019-07-10 LAB — COMPREHENSIVE METABOLIC PANEL
ALT: 30 U/L (ref 0–44)
AST: 15 U/L (ref 15–41)
Albumin: 3.7 g/dL (ref 3.5–5.0)
Alkaline Phosphatase: 58 U/L (ref 38–126)
Anion gap: 6 (ref 5–15)
BUN: 16 mg/dL (ref 8–23)
CO2: 26 mmol/L (ref 22–32)
Calcium: 9.1 mg/dL (ref 8.9–10.3)
Chloride: 103 mmol/L (ref 98–111)
Creatinine, Ser: 1.34 mg/dL — ABNORMAL HIGH (ref 0.61–1.24)
GFR calc Af Amer: >60 mL/min (ref >60)
GFR calc non Af Amer: 54 mL/min — ABNORMAL LOW (ref >60)
Glucose, Bld: 93 mg/dL (ref 70–99)
Potassium: 4.3 mmol/L (ref 3.5–5.1)
Sodium: 135 mmol/L (ref 135–145)
Total Bilirubin: 0.4 mg/dL (ref 0.3–1.2)
Total Protein: 5.9 g/dL — ABNORMAL LOW (ref 6.5–8.1)

## 2019-07-10 LAB — CBC WITH DIFFERENTIAL/PLATELET
Abs Immature Granulocytes: 0.01 10*3/uL (ref 0.00–0.07)
Basophils Absolute: 0 10*3/uL (ref 0.0–0.1)
Basophils Relative: 1 %
Eosinophils Absolute: 0.1 10*3/uL (ref 0.0–0.5)
Eosinophils Relative: 5 %
HCT: 29.3 % — ABNORMAL LOW (ref 39.0–52.0)
Hemoglobin: 9.7 g/dL — ABNORMAL LOW (ref 13.0–17.0)
Immature Granulocytes: 0 %
Lymphocytes Relative: 15 %
Lymphs Abs: 0.4 10*3/uL — ABNORMAL LOW (ref 0.7–4.0)
MCH: 31.1 pg (ref 26.0–34.0)
MCHC: 33.1 g/dL (ref 30.0–36.0)
MCV: 93.9 fL (ref 80.0–100.0)
Monocytes Absolute: 0.2 10*3/uL (ref 0.1–1.0)
Monocytes Relative: 6 %
Neutro Abs: 1.8 10*3/uL (ref 1.7–7.7)
Neutrophils Relative %: 73 %
Platelets: 161 10*3/uL (ref 150–400)
RBC: 3.12 MIL/uL — ABNORMAL LOW (ref 4.22–5.81)
RDW: 16.9 % — ABNORMAL HIGH (ref 11.5–15.5)
WBC: 2.5 10*3/uL — ABNORMAL LOW (ref 4.0–10.5)
nRBC: 0 % (ref 0.0–0.2)

## 2019-07-10 MED ORDER — SODIUM CHLORIDE 0.9% FLUSH
10.0000 mL | INTRAVENOUS | Status: DC | PRN
  Administered 2019-07-10: 11:00:00 10 mL via INTRAVENOUS
  Filled 2019-07-10: qty 10

## 2019-07-10 MED ORDER — SODIUM CHLORIDE 0.9 % IV SOLN
Freq: Once | INTRAVENOUS | Status: AC
  Administered 2019-07-10: 09:00:00 via INTRAVENOUS
  Filled 2019-07-10: qty 250

## 2019-07-10 MED ORDER — HEPARIN SOD (PORK) LOCK FLUSH 100 UNIT/ML IV SOLN
500.0000 [IU] | Freq: Once | INTRAVENOUS | Status: AC
  Administered 2019-07-10: 11:00:00 500 [IU] via INTRAVENOUS
  Filled 2019-07-10: qty 5

## 2019-07-10 NOTE — Patient Instructions (Signed)
Dehydration, Adult  Dehydration is when there is not enough fluid or water in your body. This happens when you lose more fluids than you take in. Dehydration can range from mild to very bad. It should be treated right away to keep it from getting very bad. Symptoms of mild dehydration may include:  Thirst.  Dry lips.  Slightly dry mouth.  Dry, warm skin.  Dizziness. Symptoms of moderate dehydration may include:  Very dry mouth.  Muscle cramps.  Dark pee (urine). Pee may be the color of tea.  Your body making less pee.  Your eyes making fewer tears.  Heartbeat that is uneven or faster than normal (palpitations).  Headache.  Light-headedness, especially when you stand up from sitting.  Fainting (syncope). Symptoms of very bad dehydration may include:  Changes in skin, such as: ? Cold and clammy skin. ? Blotchy (mottled) or pale skin. ? Skin that does not quickly return to normal after being lightly pinched and let go (poor skin turgor).  Changes in body fluids, such as: ? Feeling very thirsty. ? Your eyes making fewer tears. ? Not sweating when body temperature is high, such as in hot weather. ? Your body making very little pee.  Changes in vital signs, such as: ? Weak pulse. ? Pulse that is more than 100 beats a minute when you are sitting still. ? Fast breathing. ? Low blood pressure.  Other changes, such as: ? Sunken eyes. ? Cold hands and feet. ? Confusion. ? Lack of energy (lethargy). ? Trouble waking up from sleep. ? Short-term weight loss. ? Unconsciousness. Follow these instructions at home:   If told by your doctor, drink an ORS: ? Make an ORS by using instructions on the package. ? Start by drinking small amounts, about  cup (120 mL) every 5-10 minutes. ? Slowly drink more until you have had the amount that your doctor said to have.  Drink enough clear fluid to keep your pee clear or pale yellow. If you were told to drink an ORS, finish the  ORS first, then start slowly drinking clear fluids. Drink fluids such as: ? Water. Do not drink only water by itself. Doing that can make the salt (sodium) level in your body get too low (hyponatremia). ? Ice chips. ? Fruit juice that you have added water to (diluted). ? Low-calorie sports drinks.  Avoid: ? Alcohol. ? Drinks that have a lot of sugar. These include high-calorie sports drinks, fruit juice that does not have water added, and soda. ? Caffeine. ? Foods that are greasy or have a lot of fat or sugar.  Take over-the-counter and prescription medicines only as told by your doctor.  Do not take salt tablets. Doing that can make the salt level in your body get too high (hypernatremia).  Eat foods that have minerals (electrolytes). Examples include bananas, oranges, potatoes, tomatoes, and spinach.  Keep all follow-up visits as told by your doctor. This is important. Contact a doctor if:  You have belly (abdominal) pain that: ? Gets worse. ? Stays in one area (localizes).  You have a rash.  You have a stiff neck.  You get angry or annoyed more easily than normal (irritability).  You are more sleepy than normal.  You have a harder time waking up than normal.  You feel: ? Weak. ? Dizzy. ? Very thirsty.  You have peed (urinated) only a small amount of very dark pee during 6-8 hours. Get help right away if:  You have   symptoms of very bad dehydration.  You cannot drink fluids without throwing up (vomiting).  Your symptoms get worse with treatment.  You have a fever.  You have a very bad headache.  You are throwing up or having watery poop (diarrhea) and it: ? Gets worse. ? Does not go away.  You have blood or something green (bile) in your throw-up.  You have blood in your poop (stool). This may cause poop to look black and tarry.  You have not peed in 6-8 hours.  You pass out (faint).  Your heart rate when you are sitting still is more than 100 beats a  minute.  You have trouble breathing. This information is not intended to replace advice given to you by your health care provider. Make sure you discuss any questions you have with your health care provider. Document Released: 05/12/2009 Document Revised: 06/28/2017 Document Reviewed: 09/09/2015 Elsevier Patient Education  2020 Elsevier Inc.  

## 2019-07-10 NOTE — Patient Instructions (Signed)
Implanted Port Insertion, Care After This sheet gives you information about how to care for yourself after your procedure. Your health care provider may also give you more specific instructions. If you have problems or questions, contact your health care provider. What can I expect after the procedure? After the procedure, it is common to have:  Discomfort at the port insertion site.  Bruising on the skin over the port. This should improve over 3-4 days. Follow these instructions at home: Port care  After your port is placed, you will get a manufacturer's information card. The card has information about your port. Keep this card with you at all times.  Take care of the port as told by your health care provider. Ask your health care provider if you or a family member can get training for taking care of the port at home. A home health care nurse may also take care of the port.  Make sure to remember what type of port you have. Incision care      Follow instructions from your health care provider about how to take care of your port insertion site. Make sure you: ? Wash your hands with soap and water before and after you change your bandage (dressing). If soap and water are not available, use hand sanitizer. ? Change your dressing as told by your health care provider. ? Leave stitches (sutures), skin glue, or adhesive strips in place. These skin closures may need to stay in place for 2 weeks or longer. If adhesive strip edges start to loosen and curl up, you may trim the loose edges. Do not remove adhesive strips completely unless your health care provider tells you to do that.  Check your port insertion site every day for signs of infection. Check for: ? Redness, swelling, or pain. ? Fluid or blood. ? Warmth. ? Pus or a bad smell. Activity  Return to your normal activities as told by your health care provider. Ask your health care provider what activities are safe for you.  Do not  lift anything that is heavier than 10 lb (4.5 kg), or the limit that you are told, until your health care provider says that it is safe. General instructions  Take over-the-counter and prescription medicines only as told by your health care provider.  Do not take baths, swim, or use a hot tub until your health care provider approves. Ask your health care provider if you may take showers. You may only be allowed to take sponge baths.  Do not drive for 24 hours if you were given a sedative during your procedure.  Wear a medical alert bracelet in case of an emergency. This will tell any health care providers that you have a port.  Keep all follow-up visits as told by your health care provider. This is important. Contact a health care provider if:  You cannot flush your port with saline as directed, or you cannot draw blood from the port.  You have a fever or chills.  You have redness, swelling, or pain around your port insertion site.  You have fluid or blood coming from your port insertion site.  Your port insertion site feels warm to the touch.  You have pus or a bad smell coming from the port insertion site. Get help right away if:  You have chest pain or shortness of breath.  You have bleeding from your port that you cannot control. Summary  Take care of the port as told by your health   care provider. Keep the manufacturer's information card with you at all times.  Change your dressing as told by your health care provider.  Contact a health care provider if you have a fever or chills or if you have redness, swelling, or pain around your port insertion site.  Keep all follow-up visits as told by your health care provider. This information is not intended to replace advice given to you by your health care provider. Make sure you discuss any questions you have with your health care provider. Document Released: 05/06/2013 Document Revised: 02/11/2018 Document Reviewed: 02/11/2018  Elsevier Patient Education  2020 Elsevier Inc.  

## 2019-07-15 ENCOUNTER — Other Ambulatory Visit: Payer: Self-pay | Admitting: Hematology & Oncology

## 2019-07-15 DIAGNOSIS — R05 Cough: Secondary | ICD-10-CM

## 2019-07-15 DIAGNOSIS — R059 Cough, unspecified: Secondary | ICD-10-CM

## 2019-07-15 DIAGNOSIS — C3491 Malignant neoplasm of unspecified part of right bronchus or lung: Secondary | ICD-10-CM

## 2019-07-15 MED ORDER — HYDROCODONE-HOMATROPINE 5-1.5 MG/5ML PO SYRP: 5.0000 mL | ORAL_SOLUTION | ORAL | 0 refills | Status: DC | PRN

## 2019-07-15 MED FILL — HYDROCODONE-HOMATROPINE SYR: 5-1.5 | 15 days supply | Qty: 300 | Fill #0

## 2019-07-17 ENCOUNTER — Inpatient Hospital Stay: Payer: 59

## 2019-07-17 ENCOUNTER — Other Ambulatory Visit: Payer: Self-pay

## 2019-07-17 VITALS — BP 115/66 | HR 87 | Temp 97.8°F | Resp 17 | Ht 68.0 in | Wt 181.0 lb

## 2019-07-17 DIAGNOSIS — L03119 Cellulitis of unspecified part of limb: Secondary | ICD-10-CM

## 2019-07-17 DIAGNOSIS — Z7982 Long term (current) use of aspirin: Secondary | ICD-10-CM | POA: Diagnosis not present

## 2019-07-17 DIAGNOSIS — C3491 Malignant neoplasm of unspecified part of right bronchus or lung: Secondary | ICD-10-CM | POA: Diagnosis not present

## 2019-07-17 DIAGNOSIS — E222 Syndrome of inappropriate secretion of antidiuretic hormone: Secondary | ICD-10-CM | POA: Diagnosis not present

## 2019-07-17 DIAGNOSIS — Z79899 Other long term (current) drug therapy: Secondary | ICD-10-CM | POA: Diagnosis not present

## 2019-07-17 DIAGNOSIS — Z5111 Encounter for antineoplastic chemotherapy: Secondary | ICD-10-CM | POA: Diagnosis not present

## 2019-07-17 DIAGNOSIS — Z8546 Personal history of malignant neoplasm of prostate: Secondary | ICD-10-CM | POA: Diagnosis not present

## 2019-07-17 DIAGNOSIS — N289 Disorder of kidney and ureter, unspecified: Secondary | ICD-10-CM | POA: Diagnosis not present

## 2019-07-17 DIAGNOSIS — F17210 Nicotine dependence, cigarettes, uncomplicated: Secondary | ICD-10-CM | POA: Diagnosis not present

## 2019-07-17 LAB — COMPREHENSIVE METABOLIC PANEL
ALT: 18 U/L (ref 0–44)
AST: 14 U/L — ABNORMAL LOW (ref 15–41)
Albumin: 3.4 g/dL — ABNORMAL LOW (ref 3.5–5.0)
Alkaline Phosphatase: 65 U/L (ref 38–126)
Anion gap: 5 (ref 5–15)
BUN: 13 mg/dL (ref 8–23)
CO2: 26 mmol/L (ref 22–32)
Calcium: 8.9 mg/dL (ref 8.9–10.3)
Chloride: 106 mmol/L (ref 98–111)
Creatinine, Ser: 1.21 mg/dL (ref 0.61–1.24)
GFR calc Af Amer: >60 mL/min (ref >60)
GFR calc non Af Amer: >60 mL/min (ref >60)
Glucose, Bld: 130 mg/dL — ABNORMAL HIGH (ref 70–99)
Potassium: 4.1 mmol/L (ref 3.5–5.1)
Sodium: 137 mmol/L (ref 135–145)
Total Bilirubin: 0.4 mg/dL (ref 0.3–1.2)
Total Protein: 5.7 g/dL — ABNORMAL LOW (ref 6.5–8.1)

## 2019-07-17 LAB — CBC WITH DIFFERENTIAL/PLATELET
Abs Immature Granulocytes: 0.01 10*3/uL (ref 0.00–0.07)
Basophils Absolute: 0 10*3/uL (ref 0.0–0.1)
Basophils Relative: 1 %
Eosinophils Absolute: 0.1 10*3/uL (ref 0.0–0.5)
Eosinophils Relative: 4 %
HCT: 28.2 % — ABNORMAL LOW (ref 39.0–52.0)
Hemoglobin: 9.2 g/dL — ABNORMAL LOW (ref 13.0–17.0)
Immature Granulocytes: 1 %
Lymphocytes Relative: 19 %
Lymphs Abs: 0.3 10*3/uL — ABNORMAL LOW (ref 0.7–4.0)
MCH: 31.6 pg (ref 26.0–34.0)
MCHC: 32.6 g/dL (ref 30.0–36.0)
MCV: 96.9 fL (ref 80.0–100.0)
Monocytes Absolute: 0.3 10*3/uL (ref 0.1–1.0)
Monocytes Relative: 19 %
Neutro Abs: 0.7 10*3/uL — ABNORMAL LOW (ref 1.7–7.7)
Neutrophils Relative %: 56 %
Platelets: 159 10*3/uL (ref 150–400)
RBC: 2.91 MIL/uL — ABNORMAL LOW (ref 4.22–5.81)
RDW: 17.6 % — ABNORMAL HIGH (ref 11.5–15.5)
WBC: 1.3 10*3/uL — ABNORMAL LOW (ref 4.0–10.5)
nRBC: 0 % (ref 0.0–0.2)

## 2019-07-17 MED ORDER — SODIUM CHLORIDE 0.9% FLUSH
10.0000 mL | Freq: Once | INTRAVENOUS | Status: AC | PRN
  Administered 2019-07-17: 12:00:00 10 mL
  Filled 2019-07-17: qty 10

## 2019-07-17 MED ORDER — HEPARIN SOD (PORK) LOCK FLUSH 100 UNIT/ML IV SOLN
500.0000 [IU] | Freq: Once | INTRAVENOUS | Status: AC | PRN
  Administered 2019-07-17: 500 [IU]
  Filled 2019-07-17: qty 5

## 2019-07-17 MED ORDER — SODIUM CHLORIDE 0.9 % IV SOLN
Freq: Once | INTRAVENOUS | Status: AC
  Filled 2019-07-17: qty 250

## 2019-07-17 NOTE — Patient Instructions (Signed)
Dehydration, Adult  Dehydration is a condition in which there is not enough fluid or water in the body. This happens when you lose more fluids than you take in. Important organs, such as the kidneys, brain, and heart, cannot function without a proper amount of fluids. Any loss of fluids from the body can lead to dehydration. Dehydration can range from mild to severe. This condition should be treated right away to prevent it from becoming severe. What are the causes? This condition may be caused by:  Vomiting.  Diarrhea.  Excessive sweating, such as from heat exposure or exercise.  Not drinking enough fluid, especially: ? When ill. ? While doing activity that requires a lot of energy.  Excessive urination.  Fever.  Infection.  Certain medicines, such as medicines that cause the body to lose excess fluid (diuretics).  Inability to access safe drinking water.  Reduced physical ability to get adequate water and food. What increases the risk? This condition is more likely to develop in people:  Who have a poorly controlled long-term (chronic) illness, such as diabetes, heart disease, or kidney disease.  Who are age 13 or older.  Who are disabled.  Who live in a place with high altitude.  Who play endurance sports. What are the signs or symptoms? Symptoms of mild dehydration may include:  Thirst.  Dry lips.  Slightly dry mouth.  Dry, warm skin.  Dizziness. Symptoms of moderate dehydration may include:  Very dry mouth.  Muscle cramps.  Dark urine. Urine may be the color of tea.  Decreased urine production.  Decreased tear production.  Heartbeat that is irregular or faster than normal (palpitations).  Headache.  Light-headedness, especially when you stand up from a sitting position.  Fainting (syncope). Symptoms of severe dehydration may include:  Changes in skin, such as: ? Cold and clammy skin. ? Blotchy (mottled) or pale skin. ? Skin that does  not quickly return to normal after being lightly pinched and released (poor skin turgor).  Changes in body fluids, such as: ? Extreme thirst. ? No tear production. ? Inability to sweat when body temperature is high, such as in hot weather. ? Very little urine production.  Changes in vital signs, such as: ? Weak pulse. ? Pulse that is more than 100 beats a minute when sitting still. ? Rapid breathing. ? Low blood pressure.  Other changes, such as: ? Sunken eyes. ? Cold hands and feet. ? Confusion. ? Lack of energy (lethargy). ? Difficulty waking up from sleep. ? Short-term weight loss. ? Unconsciousness. How is this diagnosed? This condition is diagnosed based on your symptoms and a physical exam. Blood and urine tests may be done to help confirm the diagnosis. How is this treated? Treatment for this condition depends on the severity. Mild or moderate dehydration can often be treated at home. Treatment should be started right away. Do not wait until dehydration becomes severe. Severe dehydration is an emergency and it needs to be treated in a hospital. Treatment for mild dehydration may include:  Drinking more fluids.  Replacing salts and minerals in your blood (electrolytes) that you may have lost. Treatment for moderate dehydration may include:  Drinking an oral rehydration solution (ORS). This is a drink that helps you replace fluids and electrolytes (rehydrate). It can be found at pharmacies and retail stores. Treatment for severe dehydration may include:  Receiving fluids through an IV tube.  Receiving an electrolyte solution through a feeding tube that is passed through your nose and  into your stomach (nasogastric tube, or NG tube).  Correcting any abnormalities in electrolytes.  Treating the underlying cause of dehydration. Follow these instructions at home:  If directed by your health care provider, drink an ORS: ? Make an ORS by following instructions on the  package. ? Start by drinking small amounts, about  cup (120 mL) every 5-10 minutes. ? Slowly increase how much you drink until you have taken the amount recommended by your health care provider.  Drink enough clear fluid to keep your urine clear or pale yellow. If you were told to drink an ORS, finish the ORS first, then start slowly drinking other clear fluids. Drink fluids such as: ? Water. Do not drink only water. Doing that can lead to having too little salt (sodium) in the body (hyponatremia). ? Ice chips. ? Fruit juice that you have added water to (diluted fruit juice). ? Low-calorie sports drinks.  Avoid: ? Alcohol. ? Drinks that contain a lot of sugar. These include high-calorie sports drinks, fruit juice that is not diluted, and soda. ? Caffeine. ? Foods that are greasy or contain a lot of fat or sugar.  Take over-the-counter and prescription medicines only as told by your health care provider.  Do not take sodium tablets. This can lead to having too much sodium in the body (hypernatremia).  Eat foods that contain a healthy balance of electrolytes, such as bananas, oranges, potatoes, tomatoes, and spinach.  Keep all follow-up visits as told by your health care provider. This is important. Contact a health care provider if:  You have abdominal pain that: ? Gets worse. ? Stays in one area (localizes).  You have a rash.  You have a stiff neck.  You are more irritable than usual.  You are sleepier or more difficult to wake up than usual.  You feel weak or dizzy.  You feel very thirsty.  You have urinated only a small amount of very dark urine over 6-8 hours. Get help right away if:  You have symptoms of severe dehydration.  You cannot drink fluids without vomiting.  Your symptoms get worse with treatment.  You have a fever.  You have a severe headache.  You have vomiting or diarrhea that: ? Gets worse. ? Does not go away.  You have blood or green matter  (bile) in your vomit.  You have blood in your stool. This may cause stool to look black and tarry.  You have not urinated in 6-8 hours.  You faint.  Your heart rate while sitting still is over 100 beats a minute.  You have trouble breathing. This information is not intended to replace advice given to you by your health care provider. Make sure you discuss any questions you have with your health care provider. Document Released: 07/16/2005 Document Revised: 06/28/2017 Document Reviewed: 09/09/2015 Elsevier Patient Education  2020 [employer].

## 2019-07-20 ENCOUNTER — Encounter (INDEPENDENT_AMBULATORY_CARE_PROVIDER_SITE_OTHER): Payer: 59 | Admitting: Ophthalmology

## 2019-07-20 DIAGNOSIS — H35033 Hypertensive retinopathy, bilateral: Secondary | ICD-10-CM

## 2019-07-20 DIAGNOSIS — H338 Other retinal detachments: Secondary | ICD-10-CM

## 2019-07-20 DIAGNOSIS — H43813 Vitreous degeneration, bilateral: Secondary | ICD-10-CM | POA: Diagnosis not present

## 2019-07-20 DIAGNOSIS — I10 Essential (primary) hypertension: Secondary | ICD-10-CM

## 2019-07-22 ENCOUNTER — Encounter: Payer: Self-pay | Admitting: Hematology & Oncology

## 2019-07-22 ENCOUNTER — Inpatient Hospital Stay: Payer: 59

## 2019-07-22 ENCOUNTER — Other Ambulatory Visit: Payer: Self-pay

## 2019-07-22 ENCOUNTER — Inpatient Hospital Stay (HOSPITAL_BASED_OUTPATIENT_CLINIC_OR_DEPARTMENT_OTHER): Payer: 59 | Admitting: Hematology & Oncology

## 2019-07-22 VITALS — BP 131/84 | HR 93 | Temp 97.8°F | Resp 18 | Wt 188.0 lb

## 2019-07-22 DIAGNOSIS — Z8546 Personal history of malignant neoplasm of prostate: Secondary | ICD-10-CM | POA: Diagnosis not present

## 2019-07-22 DIAGNOSIS — L03119 Cellulitis of unspecified part of limb: Secondary | ICD-10-CM | POA: Diagnosis not present

## 2019-07-22 DIAGNOSIS — C3491 Malignant neoplasm of unspecified part of right bronchus or lung: Secondary | ICD-10-CM | POA: Diagnosis not present

## 2019-07-22 DIAGNOSIS — Z5111 Encounter for antineoplastic chemotherapy: Secondary | ICD-10-CM | POA: Diagnosis not present

## 2019-07-22 DIAGNOSIS — F17210 Nicotine dependence, cigarettes, uncomplicated: Secondary | ICD-10-CM | POA: Diagnosis not present

## 2019-07-22 DIAGNOSIS — Z7982 Long term (current) use of aspirin: Secondary | ICD-10-CM | POA: Diagnosis not present

## 2019-07-22 DIAGNOSIS — E222 Syndrome of inappropriate secretion of antidiuretic hormone: Secondary | ICD-10-CM | POA: Diagnosis not present

## 2019-07-22 DIAGNOSIS — Z79899 Other long term (current) drug therapy: Secondary | ICD-10-CM | POA: Diagnosis not present

## 2019-07-22 DIAGNOSIS — N289 Disorder of kidney and ureter, unspecified: Secondary | ICD-10-CM | POA: Diagnosis not present

## 2019-07-22 LAB — CBC WITH DIFFERENTIAL/PLATELET
Abs Immature Granulocytes: 0.03 10*3/uL (ref 0.00–0.07)
Basophils Absolute: 0 10*3/uL (ref 0.0–0.1)
Basophils Relative: 1 %
Eosinophils Absolute: 0 10*3/uL (ref 0.0–0.5)
Eosinophils Relative: 1 %
HCT: 29 % — ABNORMAL LOW (ref 39.0–52.0)
Hemoglobin: 9.4 g/dL — ABNORMAL LOW (ref 13.0–17.0)
Immature Granulocytes: 1 %
Lymphocytes Relative: 12 %
Lymphs Abs: 0.5 10*3/uL — ABNORMAL LOW (ref 0.7–4.0)
MCH: 31 pg (ref 26.0–34.0)
MCHC: 32.4 g/dL (ref 30.0–36.0)
MCV: 95.7 fL (ref 80.0–100.0)
Monocytes Absolute: 0.6 10*3/uL (ref 0.1–1.0)
Monocytes Relative: 14 %
Neutro Abs: 2.9 10*3/uL (ref 1.7–7.7)
Neutrophils Relative %: 71 %
Platelets: 270 10*3/uL (ref 150–400)
RBC: 3.03 MIL/uL — ABNORMAL LOW (ref 4.22–5.81)
RDW: 17 % — ABNORMAL HIGH (ref 11.5–15.5)
WBC: 4 10*3/uL (ref 4.0–10.5)
nRBC: 0 % (ref 0.0–0.2)

## 2019-07-22 LAB — COMPREHENSIVE METABOLIC PANEL
ALT: 14 U/L (ref 0–44)
AST: 12 U/L — ABNORMAL LOW (ref 15–41)
Albumin: 3.6 g/dL (ref 3.5–5.0)
Alkaline Phosphatase: 64 U/L (ref 38–126)
Anion gap: 7 (ref 5–15)
BUN: 14 mg/dL (ref 8–23)
CO2: 27 mmol/L (ref 22–32)
Calcium: 9.2 mg/dL (ref 8.9–10.3)
Chloride: 103 mmol/L (ref 98–111)
Creatinine, Ser: 1.2 mg/dL (ref 0.61–1.24)
GFR calc Af Amer: >60 mL/min (ref >60)
GFR calc non Af Amer: >60 mL/min (ref >60)
Glucose, Bld: 108 mg/dL — ABNORMAL HIGH (ref 70–99)
Potassium: 4 mmol/L (ref 3.5–5.1)
Sodium: 137 mmol/L (ref 135–145)
Total Bilirubin: 0.4 mg/dL (ref 0.3–1.2)
Total Protein: 5.9 g/dL — ABNORMAL LOW (ref 6.5–8.1)

## 2019-07-22 MED ORDER — PALONOSETRON HCL INJECTION 0.25 MG/5ML
INTRAVENOUS | Status: AC
  Filled 2019-07-22: qty 5

## 2019-07-22 MED ORDER — DEXAMETHASONE SODIUM PHOSPHATE 10 MG/ML IJ SOLN
INTRAMUSCULAR | Status: AC
  Filled 2019-07-22: qty 1

## 2019-07-22 MED ORDER — SODIUM CHLORIDE 0.9% FLUSH
10.0000 mL | INTRAVENOUS | Status: DC | PRN
  Administered 2019-07-22: 10 mL
  Filled 2019-07-22: qty 10

## 2019-07-22 MED ORDER — HEPARIN SOD (PORK) LOCK FLUSH 100 UNIT/ML IV SOLN
500.0000 [IU] | Freq: Once | INTRAVENOUS | Status: AC | PRN
  Administered 2019-07-22: 14:00:00 500 [IU]
  Filled 2019-07-22: qty 5

## 2019-07-22 MED ORDER — SODIUM CHLORIDE 0.9 % IV SOLN
Freq: Once | INTRAVENOUS | Status: AC
  Filled 2019-07-22: qty 250

## 2019-07-22 MED ORDER — DEXAMETHASONE SODIUM PHOSPHATE 10 MG/ML IJ SOLN
10.0000 mg | Freq: Once | INTRAMUSCULAR | Status: AC
  Administered 2019-07-22: 10 mg via INTRAVENOUS

## 2019-07-22 MED ORDER — SODIUM CHLORIDE 0.9 % IV SOLN
2.5600 mg/m2 | Freq: Once | INTRAVENOUS | Status: AC
  Administered 2019-07-22: 5.35 mg via INTRAVENOUS
  Filled 2019-07-22: qty 10.7

## 2019-07-22 MED ORDER — PALONOSETRON HCL INJECTION 0.25 MG/5ML
0.2500 mg | Freq: Once | INTRAVENOUS | Status: AC
  Administered 2019-07-22: 0.25 mg via INTRAVENOUS

## 2019-07-22 NOTE — Progress Notes (Signed)
Hematology and Oncology Follow Up Visit  Taylor Elliott [id number] Mar 09, 1951 68 y.o. 07/22/2019   Principle Diagnosis:   Extensive stage small cell lung cancer  SIADH secondary to small cell lung cancer  Current Therapy:           Carboplatinum/etoposide/Tecentriq-cycle #6  Atezolizumab 1200 mg IV q 3 week -- maintenance -  Start 01/21/2019  XRT for CNS mets -- completed on 03/27/2019  Lurbinectedin -- s/p cycle #3 -- started on 05/07/2019     Interim History:  [name] is back for follow-up.  He really looks good.  I am so happy that things are going well for him.  It is clear that the IV fluids that would give him weekly have helped him out quite a bit.  He is looking forward to Christmas.  He will be a relatively quiet Christmas with the and his wife.  He has had no problems with bowels or bladder.  His renal function is improved.  He has had a decent appetite.  He still smoking but only about 5 cigarettes a day.  He has had no problems with fever.  He has had no bleeding.  There has been no diarrhea.  He has had no leg swelling.  He has had no problems with headaches.  He is due for a PET scan before our next treatment in January.  Overall, his performance status is ECOG 1.    Medications:  Current Outpatient Medications:  .  amLODipine (NORVASC) 10 MG tablet, Take 10 mg by mouth daily., Disp: , Rfl:  .  aspirin EC 81 MG tablet, Take 81 mg by mouth daily., Disp: , Rfl:  .  calcium carbonate (TUMS - DOSED IN MG ELEMENTAL CALCIUM) 500 MG chewable tablet, Chew 2 tablets by mouth daily as needed for indigestion or heartburn. , Disp: , Rfl:  .  COMBIVENT RESPIMAT 20-100 MCG/ACT AERS respimat, , Disp: , Rfl:  .  dronabinol (MARINOL) 5 MG capsule, Take 1 capsule (5 mg total) by mouth 2 (two) times daily before a meal. (Patient not taking: Reported on 06/17/2019), Disp: 60 capsule, Rfl: 1 .  ENULOSE 10 GM/15ML SOLN, , Disp: , Rfl:  .  EPINEPHrine 0.3 mg/0.3 mL IJ SOAJ  injection, Inject 0.3 mg into the muscle once., Disp: , Rfl:  .  fenofibrate 160 MG tablet, TAKE 1 TABLET (160 MG TOTAL) BY MOUTH DAILY., Disp: 90 tablet, Rfl: 1 .  HYDROcodone-homatropine (HYCODAN) 5-1.5 MG/5ML syrup, Take 5 mLs by mouth every 4 (four) hours as needed for cough., Disp: 300 mL, Rfl: 0 .  lactulose (CHRONULAC) 10 GM/15ML solution, Take 15 mLs (10 g total) by mouth 2 (two) times daily as needed for mild constipation., Disp: 473 mL, Rfl: 1 .  lidocaine-prilocaine (EMLA) cream, Apply to affected area once, Disp: 30 g, Rfl: 3 .  LORazepam (ATIVAN) 1 MG tablet, Take 1 tablet (1 mg total) by mouth every 6 (six) hours as needed for anxiety., Disp: 60 tablet, Rfl: 0 .  Multiple Vitamin (MULTIVITAMIN) tablet, Take 1 tablet by mouth daily., Disp: , Rfl:  .  ondansetron (ZOFRAN) 8 MG tablet, Take 1 tablet (8 mg total) by mouth 2 (two) times daily as needed for refractory nausea / vomiting. Start on day 3 after chemotherapy., Disp: 30 tablet, Rfl: 1 .  prochlorperazine (COMPAZINE) 10 MG tablet, Take 1 tablet (10 mg total) by mouth every 6 (six) hours as needed (Nausea or vomiting). (Patient not taking: Reported on 06/17/2019), Disp: 30 tablet,  Rfl: 1 .  pyridoxine (B-6) 200 MG tablet, Take 200 mg by mouth daily., Disp: , Rfl:  .  solifenacin (VESICARE) 10 MG tablet, Take 10 mg by mouth every evening., Disp: , Rfl:  .  temazepam (RESTORIL) 30 MG capsule, Take 1 capsule (30 mg total) by mouth at bedtime as needed for sleep., Disp: 30 capsule, Rfl: 2 .  UNABLE TO FIND, Med Name: Allergy shots., Disp: , Rfl:   Allergies:  Allergies  Allergen Reactions  . Bee Venom Anaphylaxis  . Clindamycin/Lincomycin Hives    Past Medical History, Surgical history, Social history, and Family History were reviewed and updated.  Review of Systems: Review of Systems  Constitutional: Negative.   HENT:  Negative.   Eyes: Negative.   Respiratory: Positive for cough.   Cardiovascular: Negative.     Gastrointestinal: Negative.   Endocrine: Negative.   Genitourinary: Negative.    Musculoskeletal: Positive for back pain, myalgias and neck pain.  Skin: Positive for itching and rash.  Neurological: Negative.   Hematological: Negative.   Psychiatric/Behavioral: Negative.     Physical Exam:  weight is 188 lb (85.3 kg). His temporal temperature is 97.8 F (36.6 C). His blood pressure is 131/84 and his pulse is 93. His respiration is 18 and oxygen saturation is 100%.   Wt Readings from Last 3 Encounters:  07/22/19 188 lb (85.3 kg)  07/17/19 181 lb (82.1 kg)  07/02/19 183 lb 2 oz (83.1 kg)    Physical Exam Vitals reviewed.  HENT:     Head: Normocephalic and atraumatic.  Eyes:     Pupils: Pupils are equal, round, and reactive to light.  Cardiovascular:     Rate and Rhythm: Normal rate and regular rhythm.     Heart sounds: Normal heart sounds.  Pulmonary:     Effort: Pulmonary effort is normal.     Breath sounds: Normal breath sounds.  Abdominal:     General: Bowel sounds are normal.     Palpations: Abdomen is soft.     Comments: Abdominal exam shows a slightly obese abdomen.  He does have an umbilical hernia.  I really cannot palpate his liver at this point.  There is no fluid wave.  There is no inguinal adenopathy.  There is no splenomegaly.    Musculoskeletal:        General: No tenderness or deformity. Normal range of motion.     Cervical back: Normal range of motion.  Lymphadenopathy:     Cervical: No cervical adenopathy.  Skin:    General: Skin is warm and dry.     Findings: No erythema or rash.  Neurological:     Mental Status: He is alert and oriented to person, place, and time.  Psychiatric:        Behavior: Behavior normal.        Thought Content: Thought content normal.        Judgment: Judgment normal.      Lab Results  Component Value Date   WBC 4.0 07/22/2019   HGB 9.4 (L) 07/22/2019   HCT 29.0 (L) 07/22/2019   MCV 95.7 07/22/2019   PLT 270  07/22/2019     Chemistry      Component Value Date/Time   NA 137 07/22/2019 1000   NA 135 (L) 07/12/2015 0813   K 4.0 07/22/2019 1000   K 4.5 07/12/2015 0813   CL 103 07/22/2019 1000   CO2 27 07/22/2019 1000   CO2 24 07/12/2015 0813   BUN  14 07/22/2019 1000   BUN 15.7 07/12/2015 0813   CREATININE 1.20 07/22/2019 1000   CREATININE 1.32 (H) 07/03/2019 1000   CREATININE 1.6 (H) 07/12/2015 0813      Component Value Date/Time   CALCIUM 9.2 07/22/2019 1000   CALCIUM 10.1 07/12/2015 0813   ALKPHOS 64 07/22/2019 1000   ALKPHOS 115 07/12/2015 0813   AST 12 (L) 07/22/2019 1000   AST 81 (H) 07/03/2019 1000   AST 22 07/12/2015 0813   ALT 14 07/22/2019 1000   ALT 124 (H) 07/03/2019 1000   ALT 24 07/12/2015 0813   BILITOT 0.4 07/22/2019 1000   BILITOT 0.4 07/03/2019 1000   BILITOT 0.46 07/12/2015 0813       Impression and Plan: Mr. Hedberg is a 68 year old white male.  He has a past history of renal cell carcinoma and also prostate cancer.  Now, he has a third malignancy.  This is metastatic small cell lung cancer.  We will go ahead with his chemotherapy. This will be his fourth cycle of treatment.  Again, we will go ahead and set up his PET scan before his next cycle of treatment.  I would like to think that he will be responding.  We will continue his IV fluids weekly.  I am just happy that his quality life is doing better right now.  We will plan to see him back in 3 weeks.  Again, his PET scan will be done before we see him.  He will continue to have his IV fluids weekly.         Volanda Napoleon, MD 12/23/202011:06 AM

## 2019-07-22 NOTE — Patient Instructions (Signed)

## 2019-07-30 ENCOUNTER — Inpatient Hospital Stay: Payer: 59

## 2019-07-30 ENCOUNTER — Other Ambulatory Visit: Payer: Self-pay

## 2019-07-30 DIAGNOSIS — L03119 Cellulitis of unspecified part of limb: Secondary | ICD-10-CM | POA: Diagnosis not present

## 2019-07-30 DIAGNOSIS — Z5111 Encounter for antineoplastic chemotherapy: Secondary | ICD-10-CM | POA: Diagnosis not present

## 2019-07-30 DIAGNOSIS — Z7982 Long term (current) use of aspirin: Secondary | ICD-10-CM | POA: Diagnosis not present

## 2019-07-30 DIAGNOSIS — C3491 Malignant neoplasm of unspecified part of right bronchus or lung: Secondary | ICD-10-CM

## 2019-07-30 DIAGNOSIS — N289 Disorder of kidney and ureter, unspecified: Secondary | ICD-10-CM | POA: Diagnosis not present

## 2019-07-30 DIAGNOSIS — E222 Syndrome of inappropriate secretion of antidiuretic hormone: Secondary | ICD-10-CM | POA: Diagnosis not present

## 2019-07-30 DIAGNOSIS — F17210 Nicotine dependence, cigarettes, uncomplicated: Secondary | ICD-10-CM | POA: Diagnosis not present

## 2019-07-30 DIAGNOSIS — Z8546 Personal history of malignant neoplasm of prostate: Secondary | ICD-10-CM | POA: Diagnosis not present

## 2019-07-30 DIAGNOSIS — Z79899 Other long term (current) drug therapy: Secondary | ICD-10-CM | POA: Diagnosis not present

## 2019-07-30 LAB — CBC WITH DIFFERENTIAL (CANCER CENTER ONLY)
Abs Immature Granulocytes: 0.03 10*3/uL (ref 0.00–0.07)
Basophils Absolute: 0 10*3/uL (ref 0.0–0.1)
Basophils Relative: 1 %
Eosinophils Absolute: 0 10*3/uL (ref 0.0–0.5)
Eosinophils Relative: 1 %
HCT: 28.4 % — ABNORMAL LOW (ref 39.0–52.0)
Hemoglobin: 9.3 g/dL — ABNORMAL LOW (ref 13.0–17.0)
Immature Granulocytes: 1 %
Lymphocytes Relative: 12 %
Lymphs Abs: 0.3 10*3/uL — ABNORMAL LOW (ref 0.7–4.0)
MCH: 31.2 pg (ref 26.0–34.0)
MCHC: 32.7 g/dL (ref 30.0–36.0)
MCV: 95.3 fL (ref 80.0–100.0)
Monocytes Absolute: 0.1 10*3/uL (ref 0.1–1.0)
Monocytes Relative: 5 %
Neutro Abs: 2.2 10*3/uL (ref 1.7–7.7)
Neutrophils Relative %: 80 %
Platelet Count: 169 10*3/uL (ref 150–400)
RBC: 2.98 MIL/uL — ABNORMAL LOW (ref 4.22–5.81)
RDW: 15.5 % (ref 11.5–15.5)
WBC Count: 2.8 10*3/uL — ABNORMAL LOW (ref 4.0–10.5)
nRBC: 0 % (ref 0.0–0.2)

## 2019-07-30 LAB — CMP (CANCER CENTER ONLY)
ALT: 20 U/L (ref 0–44)
AST: 14 U/L — ABNORMAL LOW (ref 15–41)
Albumin: 3.7 g/dL (ref 3.5–5.0)
Alkaline Phosphatase: 64 U/L (ref 38–126)
Anion gap: 7 (ref 5–15)
BUN: 21 mg/dL (ref 8–23)
CO2: 25 mmol/L (ref 22–32)
Calcium: 9.3 mg/dL (ref 8.9–10.3)
Chloride: 103 mmol/L (ref 98–111)
Creatinine: 1.61 mg/dL — ABNORMAL HIGH (ref 0.61–1.24)
GFR, Est AFR Am: 51 mL/min — ABNORMAL LOW (ref >60)
GFR, Estimated: 44 mL/min — ABNORMAL LOW (ref >60)
Glucose, Bld: 108 mg/dL — ABNORMAL HIGH (ref 70–99)
Potassium: 4.3 mmol/L (ref 3.5–5.1)
Sodium: 135 mmol/L (ref 135–145)
Total Bilirubin: 0.6 mg/dL (ref 0.3–1.2)
Total Protein: 6 g/dL — ABNORMAL LOW (ref 6.5–8.1)

## 2019-07-30 MED ORDER — SODIUM CHLORIDE 0.9 % IV SOLN
Freq: Once | INTRAVENOUS | Status: AC
  Filled 2019-07-30: qty 250

## 2019-07-30 MED ORDER — SODIUM CHLORIDE 0.9% FLUSH
10.0000 mL | Freq: Once | INTRAVENOUS | Status: AC | PRN
  Administered 2019-07-30: 10 mL
  Filled 2019-07-30: qty 10

## 2019-07-30 MED ORDER — HEPARIN SOD (PORK) LOCK FLUSH 100 UNIT/ML IV SOLN
500.0000 [IU] | Freq: Once | INTRAVENOUS | Status: AC | PRN
  Administered 2019-07-30: 11:00:00 500 [IU]
  Filled 2019-07-30: qty 5

## 2019-07-30 MED FILL — LIDOCAINE-PRILOCAINE 2.5-2.: 2.5-2.5 | 10 days supply | Qty: 30 | Fill #0

## 2019-07-30 NOTE — Patient Instructions (Signed)
Implanted [facility] Guide An implanted port is a device that is placed under the skin. It is usually placed in the chest. The device can be used to give IV medicine, to take blood, or for dialysis. You may have an implanted port if:  You need IV medicine that would be irritating to the small veins in your hands or arms.  You need IV medicines, such as antibiotics, for a long period of time.  You need IV nutrition for a long period of time.  You need dialysis. Having a port means that your health care provider will not need to use the veins in your arms for these procedures. You may have fewer limitations when using a port than you would if you used other types of long-term IVs, and you will likely be able to return to normal activities after your incision heals. An implanted port has two main parts:  Reservoir. The reservoir is the part where a needle is inserted to give medicines or draw blood. The reservoir is round. After it is placed, it appears as a small, raised area under your skin.  Catheter. The catheter is a thin, flexible tube that connects the reservoir to a vein. Medicine that is inserted into the reservoir goes into the catheter and then into the vein. How is my port accessed? To access your port:  A numbing cream may be placed on the skin over the port site.  Your health care provider will put on a mask and sterile gloves.  The skin over your port will be cleaned carefully with a germ-killing soap and allowed to dry.  Your health care provider will gently pinch the port and insert a needle into it.  Your health care provider will check for a blood return to make sure the port is in the vein and is not clogged.  If your port needs to remain accessed to get medicine continuously (constant infusion), your health care provider will place a clear bandage (dressing) over the needle site. The dressing and needle will need to be changed every week, or as told by your health care  provider. What is flushing? Flushing helps keep the port from getting clogged. Follow instructions from your health care provider about how and when to flush the port. Ports are usually flushed with saline solution or a medicine called heparin. The need for flushing will depend on how the port is used:  If the port is only used from time to time to give medicines or draw blood, the port may need to be flushed: ? Before and after medicines have been given. ? Before and after blood has been drawn. ? As part of routine maintenance. Flushing may be recommended every 4-6 weeks.  If a constant infusion is running, the port may not need to be flushed.  Throw away any syringes in a disposal container that is meant for sharp items (sharps container). You can buy a sharps container from a pharmacy, or you can make one by using an empty hard plastic bottle with a cover. How long will my port stay implanted? The port can stay in for as long as your health care provider thinks it is needed. When it is time for the port to come out, a surgery will be done to remove it. The surgery will be similar to the procedure that was done to put the port in. Follow these instructions at home:   Flush your port as told by your health care provider.  If you need an infusion over several days, follow instructions from your health care provider about how to take care of your port site. Make sure you: ? Wash your hands with soap and water before you change your dressing. If soap and water are not available, use alcohol-based hand sanitizer. ? Change your dressing as told by your health care provider. ? Place any used dressings or infusion bags into a plastic bag. Throw that bag in the trash. ? Keep the dressing that covers the needle clean and dry. Do not get it wet. ? Do not use scissors or sharp objects near the tube. ? Keep the tube clamped, unless it is being used.  Check your port site every day for signs of  infection. Check for: ? Redness, swelling, or pain. ? Fluid or blood. ? Pus or a bad smell.  Protect the skin around the port site. ? Avoid wearing bra straps that rub or irritate the site. ? Protect the skin around your port from seat belts. Place a soft pad over your chest if needed.  Bathe or shower as told by your health care provider. The site may get wet as long as you are not actively receiving an infusion.  Return to your normal activities as told by your health care provider. Ask your health care provider what activities are safe for you.  Carry a medical alert card or wear a medical alert bracelet at all times. This will let health care providers know that you have an implanted port in case of an emergency. Get help right away if:  You have redness, swelling, or pain at the port site.  You have fluid or blood coming from your port site.  You have pus or a bad smell coming from the port site.  You have a fever. Summary  Implanted ports are usually placed in the chest for long-term IV access.  Follow instructions from your health care provider about flushing the port and changing bandages (dressings).  Take care of the area around your port by avoiding clothing that puts pressure on the area, and by watching for signs of infection.  Protect the skin around your port from seat belts. Place a soft pad over your chest if needed.  Get help right away if you have a fever or you have redness, swelling, pain, drainage, or a bad smell at the port site. This information is not intended to replace advice given to you by your health care provider. Make sure you discuss any questions you have with your health care provider. Document Revised: 11/07/2018 Document Reviewed: 08/18/2016 Elsevier Patient Education  2020 [employer].

## 2019-07-30 NOTE — Patient Instructions (Signed)
Dehydration, Adult Dehydration is a condition in which there is not enough water or other fluids in the body. This happens when a person loses more fluids than he or she takes in. Important organs, such as the kidneys, brain, and heart, cannot function without a proper amount of fluids. Any loss of fluids from the body can lead to dehydration. Dehydration can be mild, moderate, or severe. It should be treated right away to prevent it from becoming severe. What are the causes? Dehydration may be caused by:  Conditions that cause loss of water or other fluids, such as diarrhea, vomiting, or sweating or urinating a lot.  Not drinking enough fluids, especially when you are ill or doing activities that require a lot of energy.  Other illnesses and conditions, such as fever or infection.  Certain medicines, such as medicines that remove excess fluid from the body (diuretics).  Lack of safe drinking water.  Not being able to get enough water and food. What increases the risk? The following factors may make you more likely to develop this condition:  Having a long-term (chronic) illness that has not been treated properly, such as diabetes, heart disease, or kidney disease.  Being 65 years of age or older.  Having a disability.  Living in a place that is high in altitude, where thinner, drier air causes more fluid loss.  Doing exercises that put stress on your body for a long time (endurance sports). What are the signs or symptoms? Symptoms of dehydration depend on how severe it is. Mild or moderate dehydration  Thirst.  Dry lips or dry mouth.  Dizziness or light-headedness, especially when standing up from a seated position.  Muscle cramps.  Dark urine. Urine may be the color of tea.  Less urine or tears produced than usual.  Headache. Severe dehydration  Changes in skin. Your skin may be cold and clammy, blotchy, or pale. Your skin also may not return to normal after being  lightly pinched and released.  Little or no tears, urine, or sweat.  Changes in vital signs, such as rapid breathing and low blood pressure. Your pulse may be weak or may be faster than 100 beats a minute when you are sitting still.  Other changes, such as: ? Feeling very thirsty. ? Sunken eyes. ? Cold hands and feet. ? Confusion. ? Being very tired (lethargic) or having trouble waking from sleep. ? Short-term weight loss. ? Loss of consciousness. How is this diagnosed? This condition is diagnosed based on your symptoms and a physical exam. You may have blood and urine tests to help confirm the diagnosis. How is this treated? Treatment for this condition depends on how severe it is. Treatment should be started right away. Do not wait until dehydration becomes severe. Severe dehydration is an emergency and needs to be treated in a hospital.  Mild or moderate dehydration can be treated at home. You may be asked to: ? Drink more fluids. ? Drink an oral rehydration solution (ORS). This drink helps restore proper amounts of fluids and salts and minerals in the blood (electrolytes).  Severe dehydration can be treated: ? With IV fluids. ? By correcting abnormal levels of electrolytes. This is often done by giving electrolytes through a tube that is passed through your nose and into your stomach (nasogastric tube, or NG tube). ? By treating the underlying cause of dehydration. Follow these instructions at home: Oral rehydration solution If told by your health care provider, drink an ORS:  Make   an ORS by following instructions on the package.  Start by drinking small amounts, about  cup (120 mL) every 5-10 minutes.  Slowly increase how much you drink until you have taken the amount recommended by your health care provider. Eating and drinking         Drink enough clear fluid to keep your urine pale yellow. If you were told to drink an ORS, finish the ORS first and then start slowly  drinking other clear fluids. Drink fluids such as: ? Water. Do not drink only water. Doing that can lead to hyponatremia, which is having too little salt (sodium) in the body. ? Water from ice chips you suck on. ? Fruit juice that you have added water to (diluted fruit juice). ? Low-calorie sports drinks.  Eat foods that contain a healthy balance of electrolytes, such as bananas, oranges, potatoes, tomatoes, and spinach.  Do not drink alcohol.  Avoid the following: ? Drinks that contain a lot of sugar. These include high-calorie sports drinks, fruit juice that is not diluted, and soda. ? Caffeine. ? Foods that are greasy or contain a lot of fat or sugar. General instructions  Take over-the-counter and prescription medicines only as told by your health care provider.  Do not take sodium tablets. Doing that can lead to having too much sodium in the body (hypernatremia).  Return to your normal activities as told by your health care provider. Ask your health care provider what activities are safe for you.  Keep all follow-up visits as told by your health care provider. This is important. Contact a health care provider if:  You have muscle cramps, pain, or discomfort, such as: ? Pain in your abdomen and the pain gets worse or stays in one area (localizes). ? Stiff neck.  You have a rash.  You are more irritable than usual.  You are sleepier or have a harder time waking than usual.  You feel weak or dizzy.  You feel very thirsty. Get help right away if you have:  Any symptoms of severe dehydration.  Symptoms of vomiting, such as: ? You cannot eat or drink without vomiting. ? Vomiting gets worse or does not go away. ? Vomit includes blood or green matter (bile).  Symptoms that get worse with treatment.  A fever.  A severe headache.  Problems with urination or bowel movements, such as: ? Diarrhea that gets worse or does not go away. ? Blood in your stool (feces). This  may cause stool to look black and tarry. ? Not urinating, or urinating only a small amount of very dark urine, within 6-8 hours.  Trouble breathing. These symptoms may represent a serious problem that is an emergency. Do not wait to see if the symptoms will go away. Get medical help right away. Call your local emergency services (911 in the [place] Do not drive yourself to the hospital. Summary  Dehydration is a condition in which there is not enough water or other fluids in the body. This happens when a person loses more fluids than he or she takes in.  Treatment for this condition depends on how severe it is. Treatment should be started right away. Do not wait until dehydration becomes severe.  Drink enough clear fluid to keep your urine pale yellow. If you were told to drink an oral rehydration solution (ORS), finish the ORS first and then start slowly drinking other clear fluids.  Take over-the-counter and prescription medicines only as told by your health care   provider.  Get help right away if you have any symptoms of severe dehydration. This information is not intended to replace advice given to you by your health care provider. Make sure you discuss any questions you have with your health care provider. Document Revised: 02/26/2019 Document Reviewed: 02/26/2019 Elsevier Patient Education  2020 [employer] Inc[employer]   

## 2019-08-03 ENCOUNTER — Other Ambulatory Visit: Payer: Self-pay | Admitting: *Deleted

## 2019-08-03 ENCOUNTER — Encounter: Payer: Self-pay | Admitting: Hematology & Oncology

## 2019-08-03 DIAGNOSIS — R059 Cough, unspecified: Secondary | ICD-10-CM

## 2019-08-03 DIAGNOSIS — C3491 Malignant neoplasm of unspecified part of right bronchus or lung: Secondary | ICD-10-CM

## 2019-08-03 DIAGNOSIS — R05 Cough: Secondary | ICD-10-CM

## 2019-08-03 MED ORDER — HYDROCODONE-HOMATROPINE 5-1.5 MG/5ML PO SYRP: 5.0000 mL | ORAL_SOLUTION | ORAL | 0 refills | Status: DC | PRN

## 2019-08-03 MED FILL — HYDROCODONE-HOMATROPINE SOL: 5-1.5 | 10 days supply | Qty: 300 | Fill #0

## 2019-08-03 MED FILL — COMBIVENT RESPIMAT INHAL SP: 20-100 | 30 days supply | Qty: 4 | Fill #0

## 2019-08-03 MED FILL — TEMAZEPAM 30 MG CAPSULE: 30 | 30 days supply | Qty: 30 | Fill #1

## 2019-08-06 MED FILL — GENERLAC 10 GM/15 ML SOLN: 10 | 16 days supply | Qty: 473 | Fill #1

## 2019-08-07 ENCOUNTER — Inpatient Hospital Stay: Payer: 59 | Attending: Hematology & Oncology

## 2019-08-07 ENCOUNTER — Inpatient Hospital Stay: Payer: 59

## 2019-08-07 ENCOUNTER — Other Ambulatory Visit: Payer: Self-pay

## 2019-08-07 DIAGNOSIS — Z7982 Long term (current) use of aspirin: Secondary | ICD-10-CM | POA: Diagnosis not present

## 2019-08-07 DIAGNOSIS — E222 Syndrome of inappropriate secretion of antidiuretic hormone: Secondary | ICD-10-CM | POA: Insufficient documentation

## 2019-08-07 DIAGNOSIS — C3491 Malignant neoplasm of unspecified part of right bronchus or lung: Secondary | ICD-10-CM | POA: Diagnosis not present

## 2019-08-07 DIAGNOSIS — Z8546 Personal history of malignant neoplasm of prostate: Secondary | ICD-10-CM | POA: Insufficient documentation

## 2019-08-07 DIAGNOSIS — K5900 Constipation, unspecified: Secondary | ICD-10-CM | POA: Insufficient documentation

## 2019-08-07 DIAGNOSIS — L03119 Cellulitis of unspecified part of limb: Secondary | ICD-10-CM | POA: Diagnosis not present

## 2019-08-07 DIAGNOSIS — Z85528 Personal history of other malignant neoplasm of kidney: Secondary | ICD-10-CM | POA: Insufficient documentation

## 2019-08-07 DIAGNOSIS — F17210 Nicotine dependence, cigarettes, uncomplicated: Secondary | ICD-10-CM | POA: Diagnosis not present

## 2019-08-07 DIAGNOSIS — Z79899 Other long term (current) drug therapy: Secondary | ICD-10-CM | POA: Diagnosis not present

## 2019-08-07 DIAGNOSIS — Z5111 Encounter for antineoplastic chemotherapy: Secondary | ICD-10-CM | POA: Diagnosis not present

## 2019-08-07 LAB — CBC WITH DIFFERENTIAL/PLATELET
Abs Immature Granulocytes: 0.03 10*3/uL (ref 0.00–0.07)
Basophils Absolute: 0 10*3/uL (ref 0.0–0.1)
Basophils Relative: 0 %
Eosinophils Absolute: 0.1 10*3/uL (ref 0.0–0.5)
Eosinophils Relative: 2 %
HCT: 28.2 % — ABNORMAL LOW (ref 39.0–52.0)
Hemoglobin: 9.4 g/dL — ABNORMAL LOW (ref 13.0–17.0)
Immature Granulocytes: 1 %
Lymphocytes Relative: 12 %
Lymphs Abs: 0.4 10*3/uL — ABNORMAL LOW (ref 0.7–4.0)
MCH: 31.5 pg (ref 26.0–34.0)
MCHC: 33.3 g/dL (ref 30.0–36.0)
MCV: 94.6 fL (ref 80.0–100.0)
Monocytes Absolute: 0.5 10*3/uL (ref 0.1–1.0)
Monocytes Relative: 19 %
Neutro Abs: 1.8 10*3/uL (ref 1.7–7.7)
Neutrophils Relative %: 66 %
Platelets: 244 10*3/uL (ref 150–400)
RBC: 2.98 MIL/uL — ABNORMAL LOW (ref 4.22–5.81)
RDW: 16.1 % — ABNORMAL HIGH (ref 11.5–15.5)
WBC: 2.8 10*3/uL — ABNORMAL LOW (ref 4.0–10.5)
nRBC: 0 % (ref 0.0–0.2)

## 2019-08-07 LAB — COMPREHENSIVE METABOLIC PANEL
ALT: 14 U/L (ref 0–44)
AST: 12 U/L — ABNORMAL LOW (ref 15–41)
Albumin: 3.7 g/dL (ref 3.5–5.0)
Alkaline Phosphatase: 63 U/L (ref 38–126)
Anion gap: 7 (ref 5–15)
BUN: 12 mg/dL (ref 8–23)
CO2: 27 mmol/L (ref 22–32)
Calcium: 9.3 mg/dL (ref 8.9–10.3)
Chloride: 102 mmol/L (ref 98–111)
Creatinine, Ser: 1.26 mg/dL — ABNORMAL HIGH (ref 0.61–1.24)
GFR calc Af Amer: >60 mL/min (ref >60)
GFR calc non Af Amer: 59 mL/min — ABNORMAL LOW (ref >60)
Glucose, Bld: 108 mg/dL — ABNORMAL HIGH (ref 70–99)
Potassium: 3.7 mmol/L (ref 3.5–5.1)
Sodium: 136 mmol/L (ref 135–145)
Total Bilirubin: 0.4 mg/dL (ref 0.3–1.2)
Total Protein: 5.9 g/dL — ABNORMAL LOW (ref 6.5–8.1)

## 2019-08-07 MED ORDER — SODIUM CHLORIDE 0.9 % IV SOLN
Freq: Once | INTRAVENOUS | Status: AC
  Filled 2019-08-07: qty 250

## 2019-08-07 MED ORDER — SODIUM CHLORIDE 0.9% FLUSH
3.0000 mL | Freq: Once | INTRAVENOUS | Status: AC | PRN
  Administered 2019-08-07: 10 mL
  Filled 2019-08-07: qty 10

## 2019-08-07 MED ORDER — HEPARIN SOD (PORK) LOCK FLUSH 100 UNIT/ML IV SOLN
250.0000 [IU] | Freq: Once | INTRAVENOUS | Status: AC | PRN
  Administered 2019-08-07: 250 [IU]
  Filled 2019-08-07: qty 5

## 2019-08-07 NOTE — Patient Instructions (Signed)
Continue hydrating at home.  Increase fluid intake.

## 2019-08-10 ENCOUNTER — Telehealth: Payer: Self-pay | Admitting: *Deleted

## 2019-08-10 ENCOUNTER — Other Ambulatory Visit: Payer: Self-pay

## 2019-08-10 ENCOUNTER — Encounter (HOSPITAL_COMMUNITY)
Admission: RE | Admit: 2019-08-10 | Discharge: 2019-08-10 | Disposition: A | Discharge status: Home / Self Care | Payer: 59 | Source: Ambulatory Visit | Attending: Hematology & Oncology | Admitting: Hematology & Oncology

## 2019-08-10 DIAGNOSIS — C787 Secondary malignant neoplasm of liver and intrahepatic bile duct: Secondary | ICD-10-CM | POA: Diagnosis not present

## 2019-08-10 DIAGNOSIS — C3490 Malignant neoplasm of unspecified part of unspecified bronchus or lung: Secondary | ICD-10-CM | POA: Diagnosis not present

## 2019-08-10 DIAGNOSIS — C3491 Malignant neoplasm of unspecified part of right bronchus or lung: Secondary | ICD-10-CM | POA: Insufficient documentation

## 2019-08-10 LAB — GLUCOSE, CAPILLARY: Glucose-Capillary: 108 mg/dL — ABNORMAL HIGH (ref 70–99)

## 2019-08-10 MED ORDER — FLUDEOXYGLUCOSE F - 18 (FDG) INJECTION
9.4000 | Freq: Once | INTRAVENOUS | Status: AC | PRN
  Administered 2019-08-10: 9.4 via INTRAVENOUS

## 2019-08-10 NOTE — Telephone Encounter (Signed)
-----   Message from Volanda Napoleon, MD sent at 08/10/2019  4:14 PM EST ----- Call - the PET scan shows that the cancer is pretty stable!!  This is a Doctor, hospital!!!  [employer]

## 2019-08-10 NOTE — Telephone Encounter (Signed)
As noted below by Dr. Marin Olp, I informed the patient that the PET scan shows that the cancer is stable. He verbalized understanding.

## 2019-08-12 ENCOUNTER — Inpatient Hospital Stay (HOSPITAL_BASED_OUTPATIENT_CLINIC_OR_DEPARTMENT_OTHER): Payer: 59 | Admitting: Hematology & Oncology

## 2019-08-12 ENCOUNTER — Other Ambulatory Visit: Payer: 59

## 2019-08-12 ENCOUNTER — Ambulatory Visit: Payer: 59 | Admitting: Family

## 2019-08-12 ENCOUNTER — Inpatient Hospital Stay: Payer: 59

## 2019-08-12 ENCOUNTER — Encounter: Payer: Self-pay | Admitting: Hematology & Oncology

## 2019-08-12 ENCOUNTER — Other Ambulatory Visit: Payer: Self-pay

## 2019-08-12 VITALS — BP 138/72 | HR 93 | Resp 17

## 2019-08-12 VITALS — Wt 187.1 lb

## 2019-08-12 DIAGNOSIS — L03119 Cellulitis of unspecified part of limb: Secondary | ICD-10-CM

## 2019-08-12 DIAGNOSIS — C3491 Malignant neoplasm of unspecified part of right bronchus or lung: Secondary | ICD-10-CM | POA: Diagnosis not present

## 2019-08-12 DIAGNOSIS — F17210 Nicotine dependence, cigarettes, uncomplicated: Secondary | ICD-10-CM | POA: Diagnosis not present

## 2019-08-12 DIAGNOSIS — Z7982 Long term (current) use of aspirin: Secondary | ICD-10-CM | POA: Diagnosis not present

## 2019-08-12 DIAGNOSIS — K5900 Constipation, unspecified: Secondary | ICD-10-CM | POA: Diagnosis not present

## 2019-08-12 DIAGNOSIS — Z5111 Encounter for antineoplastic chemotherapy: Secondary | ICD-10-CM | POA: Diagnosis not present

## 2019-08-12 DIAGNOSIS — Z8546 Personal history of malignant neoplasm of prostate: Secondary | ICD-10-CM | POA: Diagnosis not present

## 2019-08-12 DIAGNOSIS — T63461D Toxic effect of venom of wasps, accidental (unintentional), subsequent encounter: Secondary | ICD-10-CM | POA: Diagnosis not present

## 2019-08-12 DIAGNOSIS — T63451D Toxic effect of venom of hornets, accidental (unintentional), subsequent encounter: Secondary | ICD-10-CM | POA: Diagnosis not present

## 2019-08-12 DIAGNOSIS — Z85528 Personal history of other malignant neoplasm of kidney: Secondary | ICD-10-CM | POA: Diagnosis not present

## 2019-08-12 DIAGNOSIS — E222 Syndrome of inappropriate secretion of antidiuretic hormone: Secondary | ICD-10-CM | POA: Diagnosis not present

## 2019-08-12 LAB — CBC WITH DIFFERENTIAL (CANCER CENTER ONLY)
Abs Immature Granulocytes: 0.06 10*3/uL (ref 0.00–0.07)
Basophils Absolute: 0 10*3/uL (ref 0.0–0.1)
Basophils Relative: 1 %
Eosinophils Absolute: 0.1 10*3/uL (ref 0.0–0.5)
Eosinophils Relative: 1 %
HCT: 29.4 % — ABNORMAL LOW (ref 39.0–52.0)
Hemoglobin: 9.9 g/dL — ABNORMAL LOW (ref 13.0–17.0)
Immature Granulocytes: 1 %
Lymphocytes Relative: 9 %
Lymphs Abs: 0.5 10*3/uL — ABNORMAL LOW (ref 0.7–4.0)
MCH: 32 pg (ref 26.0–34.0)
MCHC: 33.7 g/dL (ref 30.0–36.0)
MCV: 95.1 fL (ref 80.0–100.0)
Monocytes Absolute: 0.6 10*3/uL (ref 0.1–1.0)
Monocytes Relative: 10 %
Neutro Abs: 5 10*3/uL (ref 1.7–7.7)
Neutrophils Relative %: 78 %
Platelet Count: 295 10*3/uL (ref 150–400)
RBC: 3.09 MIL/uL — ABNORMAL LOW (ref 4.22–5.81)
RDW: 16 % — ABNORMAL HIGH (ref 11.5–15.5)
WBC Count: 6.3 10*3/uL (ref 4.0–10.5)
nRBC: 0 % (ref 0.0–0.2)

## 2019-08-12 LAB — CMP (CANCER CENTER ONLY)
ALT: 14 U/L (ref 0–44)
AST: 12 U/L — ABNORMAL LOW (ref 15–41)
Albumin: 3.7 g/dL (ref 3.5–5.0)
Alkaline Phosphatase: 70 U/L (ref 38–126)
Anion gap: 6 (ref 5–15)
BUN: 14 mg/dL (ref 8–23)
CO2: 25 mmol/L (ref 22–32)
Calcium: 9.5 mg/dL (ref 8.9–10.3)
Chloride: 103 mmol/L (ref 98–111)
Creatinine: 1.16 mg/dL (ref 0.61–1.24)
GFR, Est AFR Am: >60 mL/min (ref >60)
GFR, Estimated: >60 mL/min (ref >60)
Glucose, Bld: 104 mg/dL — ABNORMAL HIGH (ref 70–99)
Potassium: 4 mmol/L (ref 3.5–5.1)
Sodium: 134 mmol/L — ABNORMAL LOW (ref 135–145)
Total Bilirubin: 0.4 mg/dL (ref 0.3–1.2)
Total Protein: 5.9 g/dL — ABNORMAL LOW (ref 6.5–8.1)

## 2019-08-12 LAB — LACTATE DEHYDROGENASE: LDH: 176 U/L (ref 98–192)

## 2019-08-12 MED ORDER — PALONOSETRON HCL INJECTION 0.25 MG/5ML
0.2500 mg | Freq: Once | INTRAVENOUS | Status: AC
  Administered 2019-08-12: 13:00:00 0.25 mg via INTRAVENOUS

## 2019-08-12 MED ORDER — PALONOSETRON HCL INJECTION 0.25 MG/5ML
INTRAVENOUS | Status: AC
  Filled 2019-08-12: qty 5

## 2019-08-12 MED ORDER — DEXAMETHASONE SODIUM PHOSPHATE 10 MG/ML IJ SOLN
10.0000 mg | Freq: Once | INTRAMUSCULAR | Status: AC
  Administered 2019-08-12: 13:00:00 10 mg via INTRAVENOUS

## 2019-08-12 MED ORDER — HEPARIN SOD (PORK) LOCK FLUSH 100 UNIT/ML IV SOLN
500.0000 [IU] | Freq: Once | INTRAVENOUS | Status: AC | PRN
  Administered 2019-08-12: 500 [IU]
  Filled 2019-08-12: qty 5

## 2019-08-12 MED ORDER — SODIUM CHLORIDE 0.9 % IV SOLN
Freq: Once | INTRAVENOUS | Status: DC
  Filled 2019-08-12: qty 250

## 2019-08-12 MED ORDER — SODIUM CHLORIDE 0.9 % IV SOLN
2.5600 mg/m2 | Freq: Once | INTRAVENOUS | Status: AC
  Administered 2019-08-12: 14:00:00 5.35 mg via INTRAVENOUS
  Filled 2019-08-12: qty 10.7

## 2019-08-12 MED ORDER — DEXAMETHASONE SODIUM PHOSPHATE 10 MG/ML IJ SOLN
INTRAMUSCULAR | Status: AC
  Filled 2019-08-12: qty 1

## 2019-08-12 MED ORDER — SODIUM CHLORIDE 0.9% FLUSH
10.0000 mL | INTRAVENOUS | Status: DC | PRN
  Administered 2019-08-12: 10 mL
  Filled 2019-08-12: qty 10

## 2019-08-12 MED ORDER — SODIUM CHLORIDE 0.9 % IV SOLN
Freq: Once | INTRAVENOUS | Status: AC
  Filled 2019-08-12: qty 250

## 2019-08-12 NOTE — Patient Instructions (Signed)

## 2019-08-12 NOTE — Addendum Note (Signed)
Addended by: Burney Gauze R on: 08/12/2019 04:31 PM   Modules accepted: Orders

## 2019-08-12 NOTE — Patient Instructions (Signed)

## 2019-08-12 NOTE — Progress Notes (Signed)
Hematology and Oncology Follow Up Visit  Taylor Elliott [id number] [birth year] 69 y.o. 08/12/2019   Principle Diagnosis:   Extensive stage small cell lung cancer  SIADH secondary to small cell lung cancer  Current Therapy:           Carboplatinum/etoposide/Tecentriq-cycle #6  Atezolizumab 1200 mg IV q 3 week -- maintenance -  Start 01/21/2019  XRT for CNS mets -- completed on 03/27/2019  Lurbinectedin -- s/p cycle #4-- started on 05/07/2019     Interim History:  Taylor Elliott is back for follow-up.  Overall, I think he is doing okay.  We did do a PET scan on him.  This was done last week.  The PET scan essentially showed everything was relatively stable.  He had some areas that may have been a little bit more active but then he had other areas which were little bit less active.  He is complaining of some poor hearing.  I looked into his ear canals.  The left ear canal seems to be blocked by cerumen.  He is still smoking.  He probably smokes about 6 cigarettes a day.  His appetite is doing okay.  He has had no problems with nausea or vomiting.  He is a little constipated.  He says that the IV fluids I gave him seem to help quite a bit.  Give him IV fluids a couple days after treatment.  This does seem to make life better for him.  He has had no issues with headache.  He has not had an MRI of the brain for couple months.  I think we probably need to repeat the MRI of the brain so that we can make sure there is no recurrences.  He has had no problems with leg swelling.  He has had no rashes.  His renal function is improving quite nicely.  Currently, I would say his performance status is ECOG 1.      Medications:  Current Outpatient Medications:  .  amLODipine (NORVASC) 10 MG tablet, Take 10 mg by mouth daily., Disp: , Rfl:  .  aspirin EC 81 MG tablet, Take 81 mg by mouth daily., Disp: , Rfl:  .  calcium carbonate (TUMS - DOSED IN MG ELEMENTAL CALCIUM) 500 MG chewable tablet, Chew  2 tablets by mouth daily as needed for indigestion or heartburn. , Disp: , Rfl:  .  COMBIVENT RESPIMAT 20-100 MCG/ACT AERS respimat, 2 (two) times daily. , Disp: , Rfl:  .  fenofibrate 160 MG tablet, TAKE 1 TABLET (160 MG TOTAL) BY MOUTH DAILY., Disp: 90 tablet, Rfl: 1 .  HYDROcodone-homatropine (HYCODAN) 5-1.5 MG/5ML syrup, Take 5 mLs by mouth every 4 (four) hours as needed for cough., Disp: 300 mL, Rfl: 0 .  lactulose (CHRONULAC) 10 GM/15ML solution, Take 15 mLs (10 g total) by mouth 2 (two) times daily as needed for mild constipation., Disp: 473 mL, Rfl: 1 .  lidocaine-prilocaine (EMLA) cream, Apply to affected area once, Disp: 30 g, Rfl: 3 .  LORazepam (ATIVAN) 1 MG tablet, Take 1 tablet (1 mg total) by mouth every 6 (six) hours as needed for anxiety., Disp: 60 tablet, Rfl: 0 .  Multiple Vitamin (MULTIVITAMIN) tablet, Take 1 tablet by mouth daily., Disp: , Rfl:  .  ondansetron (ZOFRAN) 8 MG tablet, Take 1 tablet (8 mg total) by mouth 2 (two) times daily as needed for refractory nausea / vomiting. Start on day 3 after chemotherapy., Disp: 30 tablet, Rfl: 1 .  pyridoxine (B-6) 200  MG tablet, Take 200 mg by mouth daily., Disp: , Rfl:  .  solifenacin (VESICARE) 10 MG tablet, Take 10 mg by mouth every evening., Disp: , Rfl:  .  temazepam (RESTORIL) 30 MG capsule, Take 1 capsule (30 mg total) by mouth at bedtime as needed for sleep., Disp: 30 capsule, Rfl: 2 .  UNABLE TO FIND, Med Name: Allergy shots., Disp: , Rfl:  .  dronabinol (MARINOL) 5 MG capsule, Take 1 capsule (5 mg total) by mouth 2 (two) times daily before a meal. (Patient not taking: Reported on 06/17/2019), Disp: 60 capsule, Rfl: 1 .  EPINEPHrine 0.3 mg/0.3 mL IJ SOAJ injection, Inject 0.3 mg into the muscle once., Disp: , Rfl:  .  prochlorperazine (COMPAZINE) 10 MG tablet, Take 1 tablet (10 mg total) by mouth every 6 (six) hours as needed (Nausea or vomiting). (Patient not taking: Reported on 06/17/2019), Disp: 30 tablet, Rfl:  1  Allergies:  Allergies  Allergen Reactions  . Bee Venom Anaphylaxis  . Clindamycin/Lincomycin Hives    Past Medical History, Surgical history, Social history, and Family History were reviewed and updated.  Review of Systems: Review of Systems  Constitutional: Negative.   HENT:  Negative.   Eyes: Negative.   Respiratory: Positive for cough.   Cardiovascular: Negative.   Gastrointestinal: Negative.   Endocrine: Negative.   Genitourinary: Negative.    Musculoskeletal: Positive for back pain, myalgias and neck pain.  Skin: Positive for itching and rash.  Neurological: Negative.   Hematological: Negative.   Psychiatric/Behavioral: Negative.     Physical Exam:  weight is 187 lb 1.3 oz (84.9 kg).   Wt Readings from Last 3 Encounters:  08/12/19 187 lb 1.3 oz (84.9 kg)  07/22/19 188 lb (85.3 kg)  07/17/19 181 lb (82.1 kg)    Physical Exam Vitals reviewed.  HENT:     Head: Normocephalic and atraumatic.  Eyes:     Pupils: Pupils are equal, round, and reactive to light.  Cardiovascular:     Rate and Rhythm: Normal rate and regular rhythm.     Heart sounds: Normal heart sounds.  Pulmonary:     Effort: Pulmonary effort is normal.     Breath sounds: Normal breath sounds.  Abdominal:     General: Bowel sounds are normal.     Palpations: Abdomen is soft.     Comments: Abdominal exam shows a slightly obese abdomen.  He does have an umbilical hernia.  I really cannot palpate his liver at this point.  There is no fluid wave.  There is no inguinal adenopathy.  There is no splenomegaly.    Musculoskeletal:        General: No tenderness or deformity. Normal range of motion.     Cervical back: Normal range of motion.  Lymphadenopathy:     Cervical: No cervical adenopathy.  Skin:    General: Skin is warm and dry.     Findings: No erythema or rash.  Neurological:     Mental Status: He is alert and oriented to person, place, and time.  Psychiatric:        Behavior: Behavior  normal.        Thought Content: Thought content normal.        Judgment: Judgment normal.      Lab Results  Component Value Date   WBC 6.3 08/12/2019   HGB 9.9 (L) 08/12/2019   HCT 29.4 (L) 08/12/2019   MCV 95.1 08/12/2019   PLT 295 08/12/2019  Chemistry      Component Value Date/Time   NA 134 (L) 08/12/2019 1142   NA 135 (L) 07/12/2015 0813   K 4.0 08/12/2019 1142   K 4.5 07/12/2015 0813   CL 103 08/12/2019 1142   CO2 25 08/12/2019 1142   CO2 24 07/12/2015 0813   BUN 14 08/12/2019 1142   BUN 15.7 07/12/2015 0813   CREATININE 1.16 08/12/2019 1142   CREATININE 1.6 (H) 07/12/2015 0813      Component Value Date/Time   CALCIUM 9.5 08/12/2019 1142   CALCIUM 10.1 07/12/2015 0813   ALKPHOS 70 08/12/2019 1142   ALKPHOS 115 07/12/2015 0813   AST 12 (L) 08/12/2019 1142   AST 22 07/12/2015 0813   ALT 14 08/12/2019 1142   ALT 24 07/12/2015 0813   BILITOT 0.4 08/12/2019 1142   BILITOT 0.46 07/12/2015 0813       Impression and Plan: Taylor Elliott is a 69 year old white male.  He has a past history of renal cell carcinoma and also prostate cancer.  Now, he has a third malignancy.  This is metastatic small cell lung cancer.  I think that we need to continue him on the chemotherapy with the lurbinectidin.  I think this is working.  His quality life is good.  Typically, I would think that his sodium was a start going down if he started seeing his disease progressing.  He was gets IV fluids with the treatment.  We will make sure he gets his IV fluids on Friday.           Volanda Napoleon, MD 1/13/202112:41 PM

## 2019-08-14 ENCOUNTER — Other Ambulatory Visit: Payer: 59

## 2019-08-14 ENCOUNTER — Other Ambulatory Visit: Payer: Self-pay

## 2019-08-14 ENCOUNTER — Ambulatory Visit: Payer: 59 | Admitting: Family

## 2019-08-14 ENCOUNTER — Inpatient Hospital Stay: Payer: 59

## 2019-08-14 ENCOUNTER — Ambulatory Visit: Payer: 59

## 2019-08-14 DIAGNOSIS — L03119 Cellulitis of unspecified part of limb: Secondary | ICD-10-CM

## 2019-08-14 DIAGNOSIS — C3491 Malignant neoplasm of unspecified part of right bronchus or lung: Secondary | ICD-10-CM

## 2019-08-14 DIAGNOSIS — Z8546 Personal history of malignant neoplasm of prostate: Secondary | ICD-10-CM | POA: Diagnosis not present

## 2019-08-14 DIAGNOSIS — Z85528 Personal history of other malignant neoplasm of kidney: Secondary | ICD-10-CM | POA: Diagnosis not present

## 2019-08-14 DIAGNOSIS — Z7982 Long term (current) use of aspirin: Secondary | ICD-10-CM | POA: Diagnosis not present

## 2019-08-14 DIAGNOSIS — Z5111 Encounter for antineoplastic chemotherapy: Secondary | ICD-10-CM | POA: Diagnosis not present

## 2019-08-14 DIAGNOSIS — F17210 Nicotine dependence, cigarettes, uncomplicated: Secondary | ICD-10-CM | POA: Diagnosis not present

## 2019-08-14 DIAGNOSIS — K5900 Constipation, unspecified: Secondary | ICD-10-CM | POA: Diagnosis not present

## 2019-08-14 DIAGNOSIS — E222 Syndrome of inappropriate secretion of antidiuretic hormone: Secondary | ICD-10-CM | POA: Diagnosis not present

## 2019-08-14 LAB — COMPREHENSIVE METABOLIC PANEL
ALT: 34 U/L (ref 0–44)
AST: 27 U/L (ref 15–41)
Albumin: 3.6 g/dL (ref 3.5–5.0)
Alkaline Phosphatase: 58 U/L (ref 38–126)
Anion gap: 7 (ref 5–15)
BUN: 26 mg/dL — ABNORMAL HIGH (ref 8–23)
CO2: 26 mmol/L (ref 22–32)
Calcium: 9.3 mg/dL (ref 8.9–10.3)
Chloride: 102 mmol/L (ref 98–111)
Creatinine, Ser: 1.5 mg/dL — ABNORMAL HIGH (ref 0.61–1.24)
GFR calc Af Amer: 55 mL/min — ABNORMAL LOW (ref >60)
GFR calc non Af Amer: 47 mL/min — ABNORMAL LOW (ref >60)
Glucose, Bld: 96 mg/dL (ref 70–99)
Potassium: 4.1 mmol/L (ref 3.5–5.1)
Sodium: 135 mmol/L (ref 135–145)
Total Bilirubin: 0.4 mg/dL (ref 0.3–1.2)
Total Protein: 5.6 g/dL — ABNORMAL LOW (ref 6.5–8.1)

## 2019-08-14 LAB — CBC WITH DIFFERENTIAL/PLATELET
Abs Immature Granulocytes: 0.03 10*3/uL (ref 0.00–0.07)
Basophils Absolute: 0 10*3/uL (ref 0.0–0.1)
Basophils Relative: 0 %
Eosinophils Absolute: 0 10*3/uL (ref 0.0–0.5)
Eosinophils Relative: 0 %
HCT: 28.6 % — ABNORMAL LOW (ref 39.0–52.0)
Hemoglobin: 9.5 g/dL — ABNORMAL LOW (ref 13.0–17.0)
Immature Granulocytes: 1 %
Lymphocytes Relative: 11 %
Lymphs Abs: 0.6 10*3/uL — ABNORMAL LOW (ref 0.7–4.0)
MCH: 31.8 pg (ref 26.0–34.0)
MCHC: 33.2 g/dL (ref 30.0–36.0)
MCV: 95.7 fL (ref 80.0–100.0)
Monocytes Absolute: 0.4 10*3/uL (ref 0.1–1.0)
Monocytes Relative: 8 %
Neutro Abs: 4 10*3/uL (ref 1.7–7.7)
Neutrophils Relative %: 80 %
Platelets: 293 10*3/uL (ref 150–400)
RBC: 2.99 MIL/uL — ABNORMAL LOW (ref 4.22–5.81)
RDW: 16 % — ABNORMAL HIGH (ref 11.5–15.5)
WBC: 5 10*3/uL (ref 4.0–10.5)
nRBC: 0 % (ref 0.0–0.2)

## 2019-08-14 MED ORDER — HEPARIN SOD (PORK) LOCK FLUSH 100 UNIT/ML IV SOLN
500.0000 [IU] | Freq: Once | INTRAVENOUS | Status: AC | PRN
  Administered 2019-08-14: 12:00:00 500 [IU]
  Filled 2019-08-14: qty 5

## 2019-08-14 MED ORDER — ALTEPLASE 2 MG IJ SOLR
2.0000 mg | Freq: Once | INTRAMUSCULAR | Status: DC | PRN
  Filled 2019-08-14: qty 2

## 2019-08-14 MED ORDER — SODIUM CHLORIDE 0.9 % IV SOLN
Freq: Once | INTRAVENOUS | Status: AC
  Filled 2019-08-14: qty 250

## 2019-08-14 MED FILL — FENOFIBRATE 160 MG TABLET: 160 | 90 days supply | Qty: 90 | Fill #1

## 2019-08-14 MED FILL — AMLODIPINE BESYLATE 10 MG T: 10 | 90 days supply | Qty: 90 | Fill #1

## 2019-08-14 NOTE — Patient Instructions (Signed)

## 2019-08-14 NOTE — Progress Notes (Signed)
Ok to run fluids at 750 ml/hr per Sheryn Bison, NP.

## 2019-08-20 ENCOUNTER — Ambulatory Visit (INDEPENDENT_AMBULATORY_CARE_PROVIDER_SITE_OTHER): Payer: 59 | Admitting: Orthopedic Surgery

## 2019-08-20 ENCOUNTER — Other Ambulatory Visit: Payer: Self-pay

## 2019-08-20 ENCOUNTER — Encounter: Payer: Self-pay | Admitting: Orthopedic Surgery

## 2019-08-20 VITALS — Ht 68.0 in | Wt 187.0 lb

## 2019-08-20 DIAGNOSIS — L97511 Non-pressure chronic ulcer of other part of right foot limited to breakdown of skin: Secondary | ICD-10-CM | POA: Diagnosis not present

## 2019-08-20 DIAGNOSIS — Z89432 Acquired absence of left foot: Secondary | ICD-10-CM

## 2019-08-20 DIAGNOSIS — B351 Tinea unguium: Secondary | ICD-10-CM | POA: Diagnosis not present
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
## 2019-08-21 ENCOUNTER — Inpatient Hospital Stay: Payer: 59

## 2019-08-21 ENCOUNTER — Other Ambulatory Visit: Payer: 59

## 2019-08-21 DIAGNOSIS — C3491 Malignant neoplasm of unspecified part of right bronchus or lung: Secondary | ICD-10-CM | POA: Diagnosis not present

## 2019-08-21 DIAGNOSIS — Z7982 Long term (current) use of aspirin: Secondary | ICD-10-CM | POA: Diagnosis not present

## 2019-08-21 DIAGNOSIS — K5900 Constipation, unspecified: Secondary | ICD-10-CM | POA: Diagnosis not present

## 2019-08-21 DIAGNOSIS — Z85528 Personal history of other malignant neoplasm of kidney: Secondary | ICD-10-CM | POA: Diagnosis not present

## 2019-08-21 DIAGNOSIS — E222 Syndrome of inappropriate secretion of antidiuretic hormone: Secondary | ICD-10-CM | POA: Diagnosis not present

## 2019-08-21 DIAGNOSIS — L03119 Cellulitis of unspecified part of limb: Secondary | ICD-10-CM

## 2019-08-21 DIAGNOSIS — Z8546 Personal history of malignant neoplasm of prostate: Secondary | ICD-10-CM | POA: Diagnosis not present

## 2019-08-21 DIAGNOSIS — Z5111 Encounter for antineoplastic chemotherapy: Secondary | ICD-10-CM | POA: Diagnosis not present

## 2019-08-21 DIAGNOSIS — F17210 Nicotine dependence, cigarettes, uncomplicated: Secondary | ICD-10-CM | POA: Diagnosis not present

## 2019-08-21 LAB — COMPREHENSIVE METABOLIC PANEL
ALT: 18 U/L (ref 0–44)
AST: 13 U/L — ABNORMAL LOW (ref 15–41)
Albumin: 3.8 g/dL (ref 3.5–5.0)
Alkaline Phosphatase: 54 U/L (ref 38–126)
Anion gap: 9 (ref 5–15)
BUN: 19 mg/dL (ref 8–23)
CO2: 24 mmol/L (ref 22–32)
Calcium: 9.2 mg/dL (ref 8.9–10.3)
Chloride: 105 mmol/L (ref 98–111)
Creatinine, Ser: 1.3 mg/dL — ABNORMAL HIGH (ref 0.61–1.24)
GFR calc Af Amer: >60 mL/min (ref >60)
GFR calc non Af Amer: 56 mL/min — ABNORMAL LOW (ref >60)
Glucose, Bld: 99 mg/dL (ref 70–99)
Potassium: 4.1 mmol/L (ref 3.5–5.1)
Sodium: 138 mmol/L (ref 135–145)
Total Bilirubin: 0.4 mg/dL (ref 0.3–1.2)
Total Protein: 6.2 g/dL — ABNORMAL LOW (ref 6.5–8.1)

## 2019-08-21 LAB — CBC WITH DIFFERENTIAL/PLATELET
Abs Immature Granulocytes: 0.03 10*3/uL (ref 0.00–0.07)
Basophils Absolute: 0 10*3/uL (ref 0.0–0.1)
Basophils Relative: 1 %
Eosinophils Absolute: 0.1 10*3/uL (ref 0.0–0.5)
Eosinophils Relative: 2 %
HCT: 29.1 % — ABNORMAL LOW (ref 39.0–52.0)
Hemoglobin: 9.7 g/dL — ABNORMAL LOW (ref 13.0–17.0)
Immature Granulocytes: 1 %
Lymphocytes Relative: 13 %
Lymphs Abs: 0.4 10*3/uL — ABNORMAL LOW (ref 0.7–4.0)
MCH: 31.6 pg (ref 26.0–34.0)
MCHC: 33.3 g/dL (ref 30.0–36.0)
MCV: 94.8 fL (ref 80.0–100.0)
Monocytes Absolute: 0.2 10*3/uL (ref 0.1–1.0)
Monocytes Relative: 8 %
Neutro Abs: 2.2 10*3/uL (ref 1.7–7.7)
Neutrophils Relative %: 75 %
Platelets: 200 10*3/uL (ref 150–400)
RBC: 3.07 MIL/uL — ABNORMAL LOW (ref 4.22–5.81)
RDW: 15.1 % (ref 11.5–15.5)
WBC: 2.9 10*3/uL — ABNORMAL LOW (ref 4.0–10.5)
nRBC: 0 % (ref 0.0–0.2)

## 2019-08-21 MED ORDER — SODIUM CHLORIDE 0.9% FLUSH
10.0000 mL | Freq: Once | INTRAVENOUS | Status: AC | PRN
  Administered 2019-08-21: 11:00:00 10 mL
  Filled 2019-08-21: qty 10

## 2019-08-21 MED ORDER — HEPARIN SOD (PORK) LOCK FLUSH 100 UNIT/ML IV SOLN
500.0000 [IU] | Freq: Once | INTRAVENOUS | Status: AC | PRN
  Administered 2019-08-21: 11:00:00 500 [IU]
  Filled 2019-08-21: qty 5

## 2019-08-21 MED ORDER — SODIUM CHLORIDE 0.9 % IV SOLN
Freq: Once | INTRAVENOUS | Status: AC
  Filled 2019-08-21: qty 250

## 2019-08-21 NOTE — Progress Notes (Signed)
OK to run fluids at 750 ml/hr per Laverna Peace, NP.

## 2019-08-21 NOTE — Patient Instructions (Signed)

## 2019-08-25 ENCOUNTER — Other Ambulatory Visit: Payer: Self-pay

## 2019-08-25 ENCOUNTER — Encounter (INDEPENDENT_AMBULATORY_CARE_PROVIDER_SITE_OTHER): Payer: Self-pay | Admitting: Otolaryngology

## 2019-08-25 ENCOUNTER — Ambulatory Visit (INDEPENDENT_AMBULATORY_CARE_PROVIDER_SITE_OTHER): Payer: 59 | Admitting: Otolaryngology

## 2019-08-25 VITALS — Temp 97.7°F

## 2019-08-25 DIAGNOSIS — H6523 Chronic serous otitis media, bilateral: Secondary | ICD-10-CM | POA: Diagnosis not present

## 2019-08-25 DIAGNOSIS — H903 Sensorineural hearing loss, bilateral: Secondary | ICD-10-CM | POA: Diagnosis not present

## 2019-08-25 MED ORDER — AMOXICILLIN 875 MG PO TABS: 875.0000 mg | ORAL_TABLET | Freq: Two times a day (BID) | ORAL | 0 refills | Status: DC

## 2019-08-25 MED FILL — AMOXICILLIN 875 MG TABS: 875 | 10 days supply | Qty: 20 | Fill #0
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
## 2019-08-26 ENCOUNTER — Other Ambulatory Visit: Payer: Self-pay | Admitting: Hematology & Oncology

## 2019-08-26 MED FILL — LORAZEPAM 1 MG TABS: 1 | 15 days supply | Qty: 60 | Fill #0

## 2019-08-26 MED FILL — SOLIFENACIN SUCCINATE 10 MG: 10 | 90 days supply | Qty: 90 | Fill #0

## 2019-08-27 MED FILL — TEMAZEPAM 30 MG CAPSULE: 30 | 30 days supply | Qty: 30 | Fill #2

## 2019-08-28 ENCOUNTER — Inpatient Hospital Stay: Payer: 59

## 2019-08-28 ENCOUNTER — Other Ambulatory Visit: Payer: 59

## 2019-08-28 ENCOUNTER — Other Ambulatory Visit: Payer: Self-pay

## 2019-08-28 DIAGNOSIS — Z7982 Long term (current) use of aspirin: Secondary | ICD-10-CM | POA: Diagnosis not present

## 2019-08-28 DIAGNOSIS — C3491 Malignant neoplasm of unspecified part of right bronchus or lung: Secondary | ICD-10-CM

## 2019-08-28 DIAGNOSIS — K5900 Constipation, unspecified: Secondary | ICD-10-CM | POA: Diagnosis not present

## 2019-08-28 DIAGNOSIS — L03119 Cellulitis of unspecified part of limb: Secondary | ICD-10-CM | POA: Diagnosis not present

## 2019-08-28 DIAGNOSIS — Z85528 Personal history of other malignant neoplasm of kidney: Secondary | ICD-10-CM | POA: Diagnosis not present

## 2019-08-28 DIAGNOSIS — E222 Syndrome of inappropriate secretion of antidiuretic hormone: Secondary | ICD-10-CM | POA: Diagnosis not present

## 2019-08-28 DIAGNOSIS — F17210 Nicotine dependence, cigarettes, uncomplicated: Secondary | ICD-10-CM | POA: Diagnosis not present

## 2019-08-28 DIAGNOSIS — Z5111 Encounter for antineoplastic chemotherapy: Secondary | ICD-10-CM | POA: Diagnosis not present

## 2019-08-28 DIAGNOSIS — Z8546 Personal history of malignant neoplasm of prostate: Secondary | ICD-10-CM | POA: Diagnosis not present

## 2019-08-28 LAB — CBC WITH DIFFERENTIAL/PLATELET
Abs Immature Granulocytes: 0.01 10*3/uL (ref 0.00–0.07)
Basophils Absolute: 0 10*3/uL (ref 0.0–0.1)
Basophils Relative: 1 %
Eosinophils Absolute: 0.1 10*3/uL (ref 0.0–0.5)
Eosinophils Relative: 4 %
HCT: 30.9 % — ABNORMAL LOW (ref 39.0–52.0)
Hemoglobin: 10.3 g/dL — ABNORMAL LOW (ref 13.0–17.0)
Immature Granulocytes: 1 %
Lymphocytes Relative: 18 %
Lymphs Abs: 0.4 10*3/uL — ABNORMAL LOW (ref 0.7–4.0)
MCH: 32.2 pg (ref 26.0–34.0)
MCHC: 33.3 g/dL (ref 30.0–36.0)
MCV: 96.6 fL (ref 80.0–100.0)
Monocytes Absolute: 0.4 10*3/uL (ref 0.1–1.0)
Monocytes Relative: 20 %
Neutro Abs: 1.2 10*3/uL — ABNORMAL LOW (ref 1.7–7.7)
Neutrophils Relative %: 56 %
Platelets: 212 10*3/uL (ref 150–400)
RBC: 3.2 MIL/uL — ABNORMAL LOW (ref 4.22–5.81)
RDW: 15.3 % (ref 11.5–15.5)
WBC: 2.1 10*3/uL — ABNORMAL LOW (ref 4.0–10.5)
nRBC: 0 % (ref 0.0–0.2)

## 2019-08-28 LAB — COMPREHENSIVE METABOLIC PANEL
ALT: 14 U/L (ref 0–44)
AST: 13 U/L — ABNORMAL LOW (ref 15–41)
Albumin: 3.9 g/dL (ref 3.5–5.0)
Alkaline Phosphatase: 62 U/L (ref 38–126)
Anion gap: 6 (ref 5–15)
BUN: 15 mg/dL (ref 8–23)
CO2: 26 mmol/L (ref 22–32)
Calcium: 9.8 mg/dL (ref 8.9–10.3)
Chloride: 105 mmol/L (ref 98–111)
Creatinine, Ser: 1.19 mg/dL (ref 0.61–1.24)
GFR calc Af Amer: >60 mL/min (ref >60)
GFR calc non Af Amer: >60 mL/min (ref >60)
Glucose, Bld: 101 mg/dL — ABNORMAL HIGH (ref 70–99)
Potassium: 4.4 mmol/L (ref 3.5–5.1)
Sodium: 137 mmol/L (ref 135–145)
Total Bilirubin: 0.3 mg/dL (ref 0.3–1.2)
Total Protein: 6.4 g/dL — ABNORMAL LOW (ref 6.5–8.1)

## 2019-08-28 MED ORDER — HEPARIN SOD (PORK) LOCK FLUSH 100 UNIT/ML IV SOLN
500.0000 [IU] | Freq: Once | INTRAVENOUS | Status: AC | PRN
  Administered 2019-08-28: 11:00:00 500 [IU]
  Filled 2019-08-28: qty 5

## 2019-08-28 MED ORDER — SODIUM CHLORIDE 0.9 % IV SOLN
Freq: Once | INTRAVENOUS | Status: AC
  Filled 2019-08-28: qty 250

## 2019-08-28 MED ORDER — SODIUM CHLORIDE 0.9% FLUSH
10.0000 mL | Freq: Once | INTRAVENOUS | Status: AC | PRN
  Administered 2019-08-28: 10 mL
  Filled 2019-08-28: qty 10

## 2019-08-28 NOTE — Patient Instructions (Signed)
Implanted [facility] Guide An implanted port is a device that is placed under the skin. It is usually placed in the chest. The device can be used to give IV medicine, to take blood, or for dialysis. You may have an implanted port if:  You need IV medicine that would be irritating to the small veins in your hands or arms.  You need IV medicines, such as antibiotics, for a long period of time.  You need IV nutrition for a long period of time.  You need dialysis. Having a port means that your health care provider will not need to use the veins in your arms for these procedures. You may have fewer limitations when using a port than you would if you used other types of long-term IVs, and you will likely be able to return to normal activities after your incision heals. An implanted port has two main parts:  Reservoir. The reservoir is the part where a needle is inserted to give medicines or draw blood. The reservoir is round. After it is placed, it appears as a small, raised area under your skin.  Catheter. The catheter is a thin, flexible tube that connects the reservoir to a vein. Medicine that is inserted into the reservoir goes into the catheter and then into the vein. How is my port accessed? To access your port:  A numbing cream may be placed on the skin over the port site.  Your health care provider will put on a mask and sterile gloves.  The skin over your port will be cleaned carefully with a germ-killing soap and allowed to dry.  Your health care provider will gently pinch the port and insert a needle into it.  Your health care provider will check for a blood return to make sure the port is in the vein and is not clogged.  If your port needs to remain accessed to get medicine continuously (constant infusion), your health care provider will place a clear bandage (dressing) over the needle site. The dressing and needle will need to be changed every week, or as told by your health care  provider. What is flushing? Flushing helps keep the port from getting clogged. Follow instructions from your health care provider about how and when to flush the port. Ports are usually flushed with saline solution or a medicine called heparin. The need for flushing will depend on how the port is used:  If the port is only used from time to time to give medicines or draw blood, the port may need to be flushed: ? Before and after medicines have been given. ? Before and after blood has been drawn. ? As part of routine maintenance. Flushing may be recommended every 4-6 weeks.  If a constant infusion is running, the port may not need to be flushed.  Throw away any syringes in a disposal container that is meant for sharp items (sharps container). You can buy a sharps container from a pharmacy, or you can make one by using an empty hard plastic bottle with a cover. How long will my port stay implanted? The port can stay in for as long as your health care provider thinks it is needed. When it is time for the port to come out, a surgery will be done to remove it. The surgery will be similar to the procedure that was done to put the port in. Follow these instructions at home:   Flush your port as told by your health care provider.  If you need an infusion over several days, follow instructions from your health care provider about how to take care of your port site. Make sure you: ? Wash your hands with soap and water before you change your dressing. If soap and water are not available, use alcohol-based hand sanitizer. ? Change your dressing as told by your health care provider. ? Place any used dressings or infusion bags into a plastic bag. Throw that bag in the trash. ? Keep the dressing that covers the needle clean and dry. Do not get it wet. ? Do not use scissors or sharp objects near the tube. ? Keep the tube clamped, unless it is being used.  Check your port site every day for signs of  infection. Check for: ? Redness, swelling, or pain. ? Fluid or blood. ? Pus or a bad smell.  Protect the skin around the port site. ? Avoid wearing bra straps that rub or irritate the site. ? Protect the skin around your port from seat belts. Place a soft pad over your chest if needed.  Bathe or shower as told by your health care provider. The site may get wet as long as you are not actively receiving an infusion.  Return to your normal activities as told by your health care provider. Ask your health care provider what activities are safe for you.  Carry a medical alert card or wear a medical alert bracelet at all times. This will let health care providers know that you have an implanted port in case of an emergency. Get help right away if:  You have redness, swelling, or pain at the port site.  You have fluid or blood coming from your port site.  You have pus or a bad smell coming from the port site.  You have a fever. Summary  Implanted ports are usually placed in the chest for long-term IV access.  Follow instructions from your health care provider about flushing the port and changing bandages (dressings).  Take care of the area around your port by avoiding clothing that puts pressure on the area, and by watching for signs of infection.  Protect the skin around your port from seat belts. Place a soft pad over your chest if needed.  Get help right away if you have a fever or you have redness, swelling, pain, drainage, or a bad smell at the port site. This information is not intended to replace advice given to you by your health care provider. Make sure you discuss any questions you have with your health care provider. Document Revised: 11/07/2018 Document Reviewed: 08/18/2016 Elsevier Patient Education  2020 [employer].

## 2019-08-28 NOTE — Patient Instructions (Signed)

## 2019-08-29 ENCOUNTER — Ambulatory Visit: Payer: 59

## 2019-09-01 ENCOUNTER — Ambulatory Visit (HOSPITAL_COMMUNITY)
Admission: RE | Admit: 2019-09-01 | Discharge: 2019-09-01 | Disposition: A | Discharge status: Home / Self Care | Payer: 59 | Source: Ambulatory Visit | Attending: Hematology & Oncology | Admitting: Hematology & Oncology

## 2019-09-01 DIAGNOSIS — C3491 Malignant neoplasm of unspecified part of right bronchus or lung: Secondary | ICD-10-CM | POA: Diagnosis not present

## 2019-09-01 DIAGNOSIS — C7931 Secondary malignant neoplasm of brain: Secondary | ICD-10-CM | POA: Diagnosis not present

## 2019-09-01 DIAGNOSIS — C3490 Malignant neoplasm of unspecified part of unspecified bronchus or lung: Secondary | ICD-10-CM | POA: Diagnosis not present

## 2019-09-01 MED ORDER — GADOBUTROL 1 MMOL/ML IV SOLN
10.0000 mL | Freq: Once | INTRAVENOUS | Status: AC | PRN
  Administered 2019-09-01: 10 mL via INTRAVENOUS

## 2019-09-02 ENCOUNTER — Other Ambulatory Visit: Payer: Self-pay | Admitting: Hematology & Oncology

## 2019-09-02 DIAGNOSIS — R059 Cough, unspecified: Secondary | ICD-10-CM

## 2019-09-02 DIAGNOSIS — R05 Cough: Secondary | ICD-10-CM

## 2019-09-02 DIAGNOSIS — C3491 Malignant neoplasm of unspecified part of right bronchus or lung: Secondary | ICD-10-CM

## 2019-09-02 NOTE — Progress Notes (Signed)
  Radiation Oncology         [phone] ________________________________  Name: Taylor Elliott MRN: [id number]  Date: 09/03/2019  DOB: 12-15-1950  SIMULATION AND TREATMENT PLANNING NOTE    ICD-10-CM   1. Secondary malignant neoplasm of brain and spinal cord ([place])  C79.31    C79.49     DIAGNOSIS:  Extensive stage small cell lung cancer  NARRATIVE:  The patient was brought to the [facility].  Identity was confirmed.  All relevant records and images related to the planned course of therapy were reviewed.  The patient freely provided informed written consent to proceed with treatment after reviewing the details related to the planned course of therapy. The consent form was witnessed and verified by the simulation staff.  Then, the patient was set-up in a stable reproducible  supine position for radiation therapy.  CT images were obtained.  Surface markings were placed.  The CT images were loaded into the planning software.  Then the target and avoidance structures were contoured.  Treatment planning then occurred.  The radiation prescription was entered and confirmed.  Then, I designed and supervised the construction of a total of 5 medically necessary complex treatment devices.  I have requested : Isodose Plan.  I have ordered: dose calc.  PLAN:  The patient will receive 20 Gy in 10 fractions. (Retreatment to the brain)  -----------------------------------  Blair Promise, PhD, MD  This document serves as a record of services personally performed by Gery Pray, MD. It was created on his behalf by Wilburn Mylar, a trained medical scribe. The creation of this record is based on the scribe's personal observations and the provider's statements to them. This document has been checked and approved by the attending provider.

## 2019-09-02 NOTE — Progress Notes (Signed)
Radiation Oncology         [phone] ________________________________  Name: Taylor Elliott MRN: [id number]  Date: 09/03/2019  DOB: June 15, 1951  Re-Consultation Note  CC: Carollee Herter, Alferd Apa, DO  [name], MD    ICD-10-CM   1. Secondary malignant neoplasm of brain and spinal cord [facility])  C79.31    C79.49     Diagnosis:   Extensive stage small cell lung cancer  Interval Since Last Radiation:  5 months, 1 week 03/10/2019 through 03/27/2019  Site Technique Total Dose Dose per Fx Completed Fx Beam Energies  Brain: Brain Complex 35/35  Gy 2.5 14/14 6X   Narrative:  The patient returns today for re-evaluation. He was last seen in our office on 07/02/2019 for routine follow up.  Current Therapy:    Lurbinectedin -- s/p cycle #4-- started on 05/07/2019  Since his last visit, he underwent restaging PET scan on 08/10/2019. This revealed: overall no significant change compared with previous exam in 05/2019-- similar FDG uptake associated with right hilar lymph node and right infrahilar nodule; persistent multifocal FDG-avid liver metastases, dominant lesion slightly increased in size with similar amount of uptake, no new liver metastases; decreased FDG uptake associated with T8 metastases; treated tumor within scattered sclerotic bone metastases; similar uptake with right iliac wing metastases.  He also underwent restaging brain MRI on 09/01/2019, which unfortunately showed new and/or recurrent metastatic disease since 04/2019. Approximately 14 small enhancing metastases, ranging from punctate to 6 mm, without significant cerebral edema or associated mass effect.  On review of systems, he reports fatigue but denies any significant headaches or visual problems. He denies any focal motor weakness in his extremities other than his previous issues. His wife is present during evaluation and she reports him being clear without any confusion. The patient is able to recall his past events  including retinal detachment quite well  ALLERGIES:  is allergic to bee venom and clindamycin/lincomycin.  Meds: Current Outpatient Medications  Medication Sig Dispense Refill  . amLODipine (NORVASC) 10 MG tablet Take 10 mg by mouth daily.    Marland Kitchen amoxicillin (AMOXIL) 875 MG tablet Take 1 tablet (875 mg total) by mouth 2 (two) times daily. 20 tablet 0  . aspirin EC 81 MG tablet Take 81 mg by mouth daily.    . calcium carbonate (TUMS - DOSED IN MG ELEMENTAL CALCIUM) 500 MG chewable tablet Chew 2 tablets by mouth daily as needed for indigestion or heartburn.     . COMBIVENT RESPIMAT 20-100 MCG/ACT AERS respimat 2 (two) times daily.     Marland Kitchen dronabinol (MARINOL) 5 MG capsule Take 1 capsule (5 mg total) by mouth 2 (two) times daily before a meal. 60 capsule 1  . EPINEPHrine 0.3 mg/0.3 mL IJ SOAJ injection Inject 0.3 mg into the muscle once.    . fenofibrate 160 MG tablet TAKE 1 TABLET (160 MG TOTAL) BY MOUTH DAILY. 90 tablet 1  . lactulose (CHRONULAC) 10 GM/15ML solution Take 15 mLs (10 g total) by mouth 2 (two) times daily as needed for mild constipation. 473 mL 1  . lidocaine-prilocaine (EMLA) cream Apply to affected area once 30 g 3  . LORazepam (ATIVAN) 1 MG tablet TAKE 1 TABLET (1 MG TOTAL) BY MOUTH EVERY 6 (SIX) HOURS AS NEEDED FOR ANXIETY. 60 tablet 0  . Multiple Vitamin (MULTIVITAMIN) tablet Take 1 tablet by mouth daily.    Marland Kitchen pyridoxine (B-6) 200 MG tablet Take 200 mg by mouth daily.    . solifenacin (  VESICARE) 10 MG tablet Take 10 mg by mouth every evening.    . temazepam (RESTORIL) 30 MG capsule Take 1 capsule (30 mg total) by mouth at bedtime as needed for sleep. 30 capsule 2  . UNABLE TO FIND Med Name: Allergy shots.    Marland Kitchen HYDROcodone-homatropine (HYCODAN) 5-1.5 MG/5ML syrup Take 5 mLs by mouth every 4 (four) hours as needed for cough. 300 mL 0  . ondansetron (ZOFRAN) 8 MG tablet Take 1 tablet (8 mg total) by mouth 2 (two) times daily as needed for refractory nausea / vomiting. Start on day  3 after chemotherapy. (Patient not taking: Reported on 09/03/2019) 30 tablet 1  . prochlorperazine (COMPAZINE) 10 MG tablet Take 1 tablet (10 mg total) by mouth every 6 (six) hours as needed (Nausea or vomiting). (Patient not taking: Reported on 09/03/2019) 30 tablet 1   No current facility-administered medications for this encounter.    Physical Findings: The patient is in no acute distress. Patient is alert and oriented.   weight is 191 lb 6.4 oz (86.8 kg). His temperature is 97.8 F (36.6 C). His blood pressure is 146/71 (abnormal) and his pulse is 92. His respiration is 20 and oxygen saturation is 100%. .  Lungs are clear to auscultation bilaterally. Heart has regular rate and rhythm. No palpable cervical, supraclavicular, or axillary adenopathy. Abdomen soft, non-tender, normal bowel sounds. Neurologic examination nonfocal  Lab Findings: Lab Results  Component Value Date   WBC 2.1 (L) 08/28/2019   HGB 10.3 (L) 08/28/2019   HCT 30.9 (L) 08/28/2019   MCV 96.6 08/28/2019   PLT 212 08/28/2019    Radiographic Findings: MR Brain W Wo Contrast  Result Date: 09/02/2019 CLINICAL DATA:  69 year old male with extensive stage small cell lung cancer. Status post whole brain radiation in August 2020. Restaging. EXAM: MRI HEAD WITHOUT AND WITH CONTRAST TECHNIQUE: Multiplanar, multiecho pulse sequences of the brain and surrounding structures were obtained without and with intravenous contrast. CONTRAST:  19mL GADAVIST GADOBUTROL 1 MMOL/ML IV SOLN COMPARISON:  Brain MRI 05/20/2019 and earlier. FINDINGS: Brain: New multifocal abnormal nodular brain enhancement since October compatible with metastatic disease. Many of these are punctate. Compared to the July pretreatment study some of these lesions appear to be new (including one of the largest - a 5 millimeter rim enhancing lesion in the anterior right frontal lobe on series 10, image 102), while some seem to be recurrent (punctate left parietal lobe  enhancement on series 10, image 101). However, none of these lesions were evident in October. Other sites of involvement include the posterior right temporal lobe on series 10, image 68 (new since July), clustered approximately 4 punctate nodular metastases in the right cerebellum on series 10, images 38-40 (new since July). All told, approximately 14 small enhancing metastases are identified today ranging from punctate to 6 millimeters diameter. And some of these are most visible on diffusion weighted imaging (series 3) due to hypercellularity. No associated mass effect. No significant edema. No dural thickening. No associated hemorrhage. No restricted diffusion to suggest acute infarction. No midline shift, ventriculomegaly, extra-axial collection or acute intracranial hemorrhage. Cervicomedullary junction and pituitary are within normal limits. Vascular: Major intracranial vascular flow voids are stable. Major dural venous sinuses are enhancing and appear to be patent. Skull and upper cervical spine: Negative visible cervical spine and spinal cord. Visualized bone marrow signal is within normal limits. Sinuses/Orbits: Postoperative changes to both globes. Stable paranasal sinuses. Other: Stable bilateral mastoid effusions since October. Visible internal auditory structures  appear normal. Scalp and face soft tissues appear negative. IMPRESSION: 1. Positive for new and/or recurrent metastatic disease scattered throughout the brain since October. Approximately 14 small enhancing metastases are identified today ranging from punctate to 6 mm. Some are most visible on DWI (series 3), otherwise there are annotated on postcontrast series 10. 2. No significant cerebral edema.  No associated mass effect. 3. Stable probable post XRT mastoid effusions. Electronically Signed   By: Genevie Ann M.D.   On: 09/02/2019 09:22   NM PET Image Restag (PS) Skull Base To Thigh  Result Date: 08/10/2019 CLINICAL DATA:  Subsequent  treatment strategy for small cell lung cancer. EXAM: NUCLEAR MEDICINE PET SKULL BASE TO THIGH TECHNIQUE: 9.4 mCi F-18 FDG was injected intravenously. Full-ring PET imaging was performed from the skull base to thigh after the radiotracer. CT data was obtained and used for attenuation correction and anatomic localization. Fasting blood glucose: 108 mg/dl COMPARISON:  06/08/19 FINDINGS: Mediastinal blood pool activity: SUV max 2.47 Liver activity: SUV max NA NECK: No hypermetabolic lymph nodes in the neck. Incidental CT findings: none CHEST: FDG avid right hilar lymph node has an SUV max of 4.7. This is compared with 7.5 previously. Infrahilar nodule has an SUV max of 9.46. This is compared with 6.4 previously. No new areas of increased FDG uptake. Incidental CT findings: Emphysema.  Aortic atherosclerosis. ABDOMEN/PELVIS: Several FDG avid liver metastases are again identified: Index lesion within posterior right hepatic lobe measures 3 cm and has an SUV max of 14.4. Previously this measured 2.2 cm within SUV max of 16.03. Index lesion within segment 2 measures 1.5 cm and has an SUV max of 9.1. Previously this was not visible on CT but had an SUV max of 8.2. Segment 8 lesion measures 1.4 cm and has an SUV max of 13.6. Previously this had SUV max of 7.38, and was also not visible on the corresponding CT images. Segment 5 liver lesion has an SUV max of 5.44 and measures 1.2 cm. Previously this had an SUV max of 7.74. No abnormal uptake within the pancreas, spleen, or adrenal glands. No FDG avid lymph nodes within the abdomen or pelvis. Incidental CT findings: Aortic atherosclerosis. No aneurysm. Right nephrectomy. Fat containing periumbilical hernia. There is diffuse irregular bladder wall thickening as noted previously. Previous prostatectomy. SKELETON: Decreased SUV max associated with FDG avid T8 metastasis within SUV max of 3.76 on today's exam. Previously 5.0. SUV max associated with the right iliac bone lesion is  equal to 2.65. Previously 2.49. Incidental CT findings: Multifocal areas of increased sclerosis is again noted throughout the bony anatomy. IMPRESSION: 1. Overall no significant change compared with previous exam. Similar FDG uptake associated with right hilar lymph node and right infrahilar nodule. 2. Persistent multifocal FDG avid liver metastases. Dominant lesion within the posteromedial right hepatic lobe is slightly increased in size with similar degree of FDG uptake. Further, previously noted FDG avid, CT occult lesions are now visible on corresponding CT images. No new liver metastases identified. 3. Decreased FDG uptake associated with T8 metastases. Scattered sclerotic bone metastases are again noted without corresponding increased uptake which are favored to represent areas of treated tumor. Similar mild FDG uptake associated with right iliac wing metastases. 4. Aortic Atherosclerosis (ICD10-I70.0) and Emphysema (ICD10-J43.9). Electronically Signed   By: Kerby Moors M.D.   On: 08/10/2019 14:13    Impression: Extensive stage small cell lung cancer now undergoing Lurbinectedin for recurrence.   Recent brain scan shows recurrence of multiple metastasis  in the brain, at least 14, all of which are small. I discussed with the patient and his wife that he would not be a candidate for stereotactic radiosurgery given the significant number of lesions. With repeat imaging (3TMRI) there could very well be even more. Options To consider would be retreating the brain with external whole brain radiation. Other option would be to just continue on systemic therapy as above. I discussed the situation with Dr. Marin Olp and he feels that the most pressing issue for this patient would be his brain metastasis and would agree with retreatment  given the situation. I discussed the course of treatment side effects and potential toxicities of retreatment to the whole brain with patient and his wife. He appears to understand  well; so far he has not experienced any memory issues or concentration issues from his previous whole brain treatments. He understands that he would be at significant risk for dementia, short-term memory issues and concentration issues but given the lack of options he is willing to accept this risk.  Plan: The patient will proceed with CT simulation later today treatments to begin on Feb 8. He received 10 treatments to the brain at 2 Gy per fraction. ____________________________________ Gery Pray, MD   This document serves as a record of services personally performed by Gery Pray, MD. It was created on his behalf by Wilburn Mylar, a trained medical scribe. The creation of this record is based on the scribe's personal observations and the provider's statements to them. This document has been checked and approved by the attending provider.

## 2019-09-03 ENCOUNTER — Ambulatory Visit
Admission: RE | Admit: 2019-09-03 | Discharge: 2019-09-03 | Disposition: A | Discharge status: Home / Self Care | Payer: 59 | Source: Ambulatory Visit | Attending: Radiation Oncology | Admitting: Radiation Oncology

## 2019-09-03 ENCOUNTER — Encounter: Payer: Self-pay | Admitting: Radiation Oncology

## 2019-09-03 ENCOUNTER — Other Ambulatory Visit: Payer: Self-pay | Admitting: *Deleted

## 2019-09-03 ENCOUNTER — Other Ambulatory Visit: Payer: Self-pay

## 2019-09-03 VITALS — BP 146/71 | HR 92 | Temp 97.8°F | Resp 20 | Wt 191.4 lb

## 2019-09-03 DIAGNOSIS — R05 Cough: Secondary | ICD-10-CM

## 2019-09-03 DIAGNOSIS — C7949 Secondary malignant neoplasm of other parts of nervous system: Secondary | ICD-10-CM | POA: Insufficient documentation

## 2019-09-03 DIAGNOSIS — Z87891 Personal history of nicotine dependence: Secondary | ICD-10-CM | POA: Insufficient documentation

## 2019-09-03 DIAGNOSIS — Z79899 Other long term (current) drug therapy: Secondary | ICD-10-CM | POA: Diagnosis not present

## 2019-09-03 DIAGNOSIS — Z7982 Long term (current) use of aspirin: Secondary | ICD-10-CM | POA: Insufficient documentation

## 2019-09-03 DIAGNOSIS — C3401 Malignant neoplasm of right main bronchus: Secondary | ICD-10-CM | POA: Insufficient documentation

## 2019-09-03 DIAGNOSIS — C7951 Secondary malignant neoplasm of bone: Secondary | ICD-10-CM | POA: Diagnosis not present

## 2019-09-03 DIAGNOSIS — Z905 Acquired absence of kidney: Secondary | ICD-10-CM | POA: Diagnosis not present

## 2019-09-03 DIAGNOSIS — Z8546 Personal history of malignant neoplasm of prostate: Secondary | ICD-10-CM | POA: Diagnosis not present

## 2019-09-03 DIAGNOSIS — Z510 Encounter for antineoplastic radiation therapy: Secondary | ICD-10-CM | POA: Diagnosis not present

## 2019-09-03 DIAGNOSIS — C3491 Malignant neoplasm of unspecified part of right bronchus or lung: Secondary | ICD-10-CM

## 2019-09-03 DIAGNOSIS — C3490 Malignant neoplasm of unspecified part of unspecified bronchus or lung: Secondary | ICD-10-CM | POA: Diagnosis not present

## 2019-09-03 DIAGNOSIS — C7931 Secondary malignant neoplasm of brain: Secondary | ICD-10-CM | POA: Insufficient documentation

## 2019-09-03 DIAGNOSIS — Z5111 Encounter for antineoplastic chemotherapy: Secondary | ICD-10-CM | POA: Diagnosis not present

## 2019-09-03 DIAGNOSIS — C787 Secondary malignant neoplasm of liver and intrahepatic bile duct: Secondary | ICD-10-CM | POA: Insufficient documentation

## 2019-09-03 DIAGNOSIS — K5900 Constipation, unspecified: Secondary | ICD-10-CM | POA: Diagnosis not present

## 2019-09-03 DIAGNOSIS — R059 Cough, unspecified: Secondary | ICD-10-CM

## 2019-09-03 DIAGNOSIS — Z85528 Personal history of other malignant neoplasm of kidney: Secondary | ICD-10-CM | POA: Diagnosis not present

## 2019-09-03 DIAGNOSIS — Z9079 Acquired absence of other genital organ(s): Secondary | ICD-10-CM | POA: Diagnosis not present

## 2019-09-03 MED ORDER — HYDROCODONE-HOMATROPINE 5-1.5 MG/5ML PO SYRP: 5.0000 mL | ORAL_SOLUTION | ORAL | 0 refills | Status: DC | PRN

## 2019-09-03 MED FILL — HYDROCODONE-HOMATROPINE SYR: 5-1.5 | 15 days supply | Qty: 300 | Fill #0

## 2019-09-03 NOTE — Patient Instructions (Signed)
Coronavirus (COVID-19) Are you at risk?  Are you at risk for the Coronavirus (COVID-19)?  To be considered HIGH RISK for Coronavirus (COVID-19), you have to meet the following criteria:  . Traveled to [place], [place], [place], [place] or [place]; or in the [place] to [place], [place], [place], or [place]; and have fever, cough, and shortness of breath within the last 2 weeks of travel OR . Been in close contact with a person diagnosed with COVID-19 within the last 2 weeks and have fever, cough, and shortness of breath . IF YOU DO NOT MEET THESE CRITERIA, YOU ARE CONSIDERED LOW RISK FOR COVID-19.  What to do if you are HIGH RISK for COVID-19?  . If you are having a medical emergency, call 911. . Seek medical care right away. Before you go to a doctor's office, urgent care or emergency department, call ahead and tell them about your recent travel, contact with someone diagnosed with COVID-19, and your symptoms. You should receive instructions from your physician's office regarding next steps of care.  . When you arrive at healthcare provider, tell the healthcare staff immediately you have returned from visiting [place], [place], [place], [place] or [place]; or traveled in the [place] to [place], [place], [place], or [place]; in the last two weeks or you have been in close contact with a person diagnosed with COVID-19 in the last 2 weeks.   . Tell the health care staff about your symptoms: fever, cough and shortness of breath. . After you have been seen by a medical provider, you will be either: o Tested for (COVID-19) and discharged home on quarantine except to seek medical care if symptoms worsen, and asked to  - Stay home and avoid contact with others until you get your results (4-5 days)  - Avoid travel on public transportation if possible (such as bus, train, or airplane) or o Sent to the Emergency Department by EMS for evaluation, COVID-19 testing, and possible  admission depending on your condition and test results.  What to do if you are LOW RISK for COVID-19?  Reduce your risk of any infection by using the same precautions used for avoiding the common cold or flu:  . Wash your hands often with soap and warm water for at least 20 seconds.  If soap and water are not readily available, use an alcohol-based hand sanitizer with at least 60% alcohol.  . If coughing or sneezing, cover your mouth and nose by coughing or sneezing into the elbow areas of your shirt or coat, into a tissue or into your sleeve (not your hands). . Avoid shaking hands with others and consider head nods or verbal greetings only. . Avoid touching your eyes, nose, or mouth with unwashed hands.  . Avoid close contact with people who are sick. . Avoid places or events with large numbers of people in one location, like concerts or sporting events. . Carefully consider travel plans you have or are making. . If you are planning any travel outside or inside the [place], visit the CDC's Travelers' Health webpage for the latest health notices. . If you have some symptoms but not all symptoms, continue to monitor at home and seek medical attention if your symptoms worsen. . If you are having a medical emergency, call 911.   ADDITIONAL HEALTHCARE OPTIONS FOR PATIENTS  [facility] / e-Visit: [email or web address]         [facility]: [phone]  [facility]: [phone]                   [facility]: [phone]   

## 2019-09-03 NOTE — Progress Notes (Signed)
Pt presents today for reconsult for whole brain radiation with Dr. Sondra Come. Pt is anxious. Pt reports mild headache, pt attributes to tension/stress. Staff is attempting to bring wife in for consult/CT sim.   BP (!) 146/71 (BP Location: Right Arm, Patient Position: Sitting, Cuff Size: Normal)   Pulse 92   Temp 97.8 F (36.6 C)   Resp 20   Wt 191 lb 6.4 oz (86.8 kg)   SpO2 100%   BMI 29.10 kg/m   Wt Readings from Last 3 Encounters:  09/03/19 191 lb 6.4 oz (86.8 kg)  08/20/19 187 lb (84.8 kg)  08/12/19 187 lb 1.3 oz (84.9 kg)   Loma Sousa, RN BSN

## 2019-09-04 ENCOUNTER — Inpatient Hospital Stay: Payer: 59

## 2019-09-04 ENCOUNTER — Inpatient Hospital Stay (HOSPITAL_BASED_OUTPATIENT_CLINIC_OR_DEPARTMENT_OTHER): Payer: 59 | Admitting: Hematology & Oncology

## 2019-09-04 ENCOUNTER — Other Ambulatory Visit: Payer: 59

## 2019-09-04 ENCOUNTER — Ambulatory Visit: Payer: 59

## 2019-09-04 ENCOUNTER — Encounter: Payer: Self-pay | Admitting: Hematology & Oncology

## 2019-09-04 VITALS — BP 150/62 | HR 88 | Resp 17

## 2019-09-04 VITALS — BP 147/68 | HR 86 | Temp 97.7°F | Resp 20 | Wt 191.0 lb

## 2019-09-04 DIAGNOSIS — Z5111 Encounter for antineoplastic chemotherapy: Secondary | ICD-10-CM | POA: Diagnosis not present

## 2019-09-04 DIAGNOSIS — C3490 Malignant neoplasm of unspecified part of unspecified bronchus or lung: Secondary | ICD-10-CM | POA: Diagnosis not present

## 2019-09-04 DIAGNOSIS — Z510 Encounter for antineoplastic radiation therapy: Secondary | ICD-10-CM | POA: Diagnosis not present

## 2019-09-04 DIAGNOSIS — C3491 Malignant neoplasm of unspecified part of right bronchus or lung: Secondary | ICD-10-CM

## 2019-09-04 DIAGNOSIS — K5900 Constipation, unspecified: Secondary | ICD-10-CM | POA: Diagnosis not present

## 2019-09-04 DIAGNOSIS — C7951 Secondary malignant neoplasm of bone: Secondary | ICD-10-CM | POA: Diagnosis not present

## 2019-09-04 DIAGNOSIS — Z85528 Personal history of other malignant neoplasm of kidney: Secondary | ICD-10-CM | POA: Diagnosis not present

## 2019-09-04 DIAGNOSIS — Z8546 Personal history of malignant neoplasm of prostate: Secondary | ICD-10-CM | POA: Diagnosis not present

## 2019-09-04 DIAGNOSIS — C787 Secondary malignant neoplasm of liver and intrahepatic bile duct: Secondary | ICD-10-CM | POA: Diagnosis not present

## 2019-09-04 DIAGNOSIS — C7931 Secondary malignant neoplasm of brain: Secondary | ICD-10-CM | POA: Diagnosis not present

## 2019-09-04 DIAGNOSIS — L03119 Cellulitis of unspecified part of limb: Secondary | ICD-10-CM

## 2019-09-04 LAB — CMP (CANCER CENTER ONLY)
ALT: 14 U/L (ref 0–44)
AST: 14 U/L — ABNORMAL LOW (ref 15–41)
Albumin: 3.9 g/dL (ref 3.5–5.0)
Alkaline Phosphatase: 60 U/L (ref 38–126)
Anion gap: 8 (ref 5–15)
BUN: 12 mg/dL (ref 8–23)
CO2: 24 mmol/L (ref 22–32)
Calcium: 9.4 mg/dL (ref 8.9–10.3)
Chloride: 94 mmol/L — ABNORMAL LOW (ref 98–111)
Creatinine: 1.05 mg/dL (ref 0.61–1.24)
GFR, Est AFR Am: >60 mL/min (ref >60)
GFR, Estimated: >60 mL/min (ref >60)
Glucose, Bld: 108 mg/dL — ABNORMAL HIGH (ref 70–99)
Potassium: 4.1 mmol/L (ref 3.5–5.1)
Sodium: 126 mmol/L — ABNORMAL LOW (ref 135–145)
Total Bilirubin: 0.4 mg/dL (ref 0.3–1.2)
Total Protein: 6.1 g/dL — ABNORMAL LOW (ref 6.5–8.1)

## 2019-09-04 LAB — CBC WITH DIFFERENTIAL (CANCER CENTER ONLY)
Abs Immature Granulocytes: 0.03 10*3/uL (ref 0.00–0.07)
Basophils Absolute: 0 10*3/uL (ref 0.0–0.1)
Basophils Relative: 1 %
Eosinophils Absolute: 0 10*3/uL (ref 0.0–0.5)
Eosinophils Relative: 1 %
HCT: 31 % — ABNORMAL LOW (ref 39.0–52.0)
Hemoglobin: 10.8 g/dL — ABNORMAL LOW (ref 13.0–17.0)
Immature Granulocytes: 1 %
Lymphocytes Relative: 7 %
Lymphs Abs: 0.3 10*3/uL — ABNORMAL LOW (ref 0.7–4.0)
MCH: 32 pg (ref 26.0–34.0)
MCHC: 34.8 g/dL (ref 30.0–36.0)
MCV: 91.7 fL (ref 80.0–100.0)
Monocytes Absolute: 0.5 10*3/uL (ref 0.1–1.0)
Monocytes Relative: 10 %
Neutro Abs: 3.8 10*3/uL (ref 1.7–7.7)
Neutrophils Relative %: 80 %
Platelet Count: 216 10*3/uL (ref 150–400)
RBC: 3.38 MIL/uL — ABNORMAL LOW (ref 4.22–5.81)
RDW: 14.5 % (ref 11.5–15.5)
WBC Count: 4.8 10*3/uL (ref 4.0–10.5)
nRBC: 0 % (ref 0.0–0.2)

## 2019-09-04 MED ORDER — SODIUM CHLORIDE 0.9% FLUSH
10.0000 mL | INTRAVENOUS | Status: DC | PRN
  Administered 2019-09-04: 10 mL via INTRAVENOUS
  Filled 2019-09-04: qty 10

## 2019-09-04 MED ORDER — SODIUM CHLORIDE 0.9% FLUSH
10.0000 mL | Freq: Once | INTRAVENOUS | Status: AC | PRN
  Administered 2019-09-04: 10 mL
  Filled 2019-09-04: qty 10

## 2019-09-04 MED ORDER — PALONOSETRON HCL INJECTION 0.25 MG/5ML
INTRAVENOUS | Status: AC
  Filled 2019-09-04: qty 5

## 2019-09-04 MED ORDER — SODIUM CHLORIDE 0.9 % IV SOLN
Freq: Once | INTRAVENOUS | Status: AC
  Filled 2019-09-04: qty 250

## 2019-09-04 MED ORDER — DEXAMETHASONE SODIUM PHOSPHATE 10 MG/ML IJ SOLN
INTRAMUSCULAR | Status: AC
  Filled 2019-09-04: qty 1

## 2019-09-04 MED ORDER — HEPARIN SOD (PORK) LOCK FLUSH 100 UNIT/ML IV SOLN
500.0000 [IU] | Freq: Once | INTRAVENOUS | Status: AC | PRN
  Administered 2019-09-04: 500 [IU]
  Filled 2019-09-04: qty 5

## 2019-09-04 NOTE — Progress Notes (Signed)
DISCONTINUE ON PATHWAY REGIMEN - Small Cell Lung     A cycle is every 21 days:     Lurbinectedin   **Always confirm dose/schedule in your pharmacy ordering system**  REASON: Toxicities / Adverse Event PRIOR TREATMENT: [id number]: Lurbinectedin 3.2 mg/m2 IV q21 Days Until Progression or Unacceptable Toxicity TREATMENT RESPONSE: Partial Response (PR)  START OFF PATHWAY REGIMEN - Small Cell Lung   [id number]:Cisplatin + Irinotecan:   A cycle is every 21 days:     Irinotecan      Cisplatin   **Always confirm dose/schedule in your pharmacy ordering system**  Patient Characteristics: Relapsed or Progressive Disease, Third Line and Beyond Therapeutic Status: Relapsed or Progressive Disease Line of Therapy: Third Line and Beyond  Intent of Therapy: Non-Curative / Palliative Intent, Discussed with Patient

## 2019-09-04 NOTE — Patient Instructions (Signed)

## 2019-09-04 NOTE — Progress Notes (Signed)
Hematology and Oncology Follow Up Visit  Taylor Elliott [id number] 08/17/1950 69 y.o. 09/04/2019   Principle Diagnosis:   Extensive stage small cell lung cancer  SIADH secondary to small cell lung cancer  Current Therapy:           Carboplatinum/etoposide/Tecentriq-cycle #6  Atezolizumab 1200 mg IV q 3 week -- maintenance -  Start 01/21/2019  XRT for CNS mets -- completed on 03/27/2019  Lurbinectedin -- s/p cycle #4-- started on 05/07/2019 -- d/c on 09/03/2019 due to progression  CDDP/Irinotecan -- start cycle #1 on 09/11/2019     Interim History:  Taylor Elliott is back for follow-up.  Unfortunately, all of the MRI of the brain showed that there were to these lesions easily.  He is seeing radiation oncology.  Clearly, I think that this is a indicator that he has progressive disease.  I really think we are going to have to change his protocol so that we can get some agent that can get into the brain.  I believe that cis-platinum/irinotecan would be a good reasonable option for him.  He is having constipation.  I think he could tolerate this.  There is been no problems with increasing cough.  He has had no nausea or vomiting.  His sodium is quite low again.  I suspect this is his SIADH recurrent which goes along with his cancer progressing.  We will get him back on the meclocycline at 300 mg p.o. twice daily.  Thankfully, he came in with his wife.  She has been a very wonderful support for him.  She has been on top of everything.  She really has been a blessing for him.  I know that Taylor Elliott is still smoking.  I talked to him about stopping smoking.  I think smoking definitely has been an issue.  I know is not spoken all that much but yet, I think that this probably has been enough to cause issues.  Currently, I would have to say that his performance status is ECOG 1.     Medications:  Current Outpatient Medications:  .  amLODipine (NORVASC) 10 MG tablet, Take 10 mg by mouth  daily., Disp: , Rfl:  .  amoxicillin (AMOXIL) 875 MG tablet, Take 1 tablet (875 mg total) by mouth 2 (two) times daily., Disp: 20 tablet, Rfl: 0 .  aspirin EC 81 MG tablet, Take 81 mg by mouth daily., Disp: , Rfl:  .  calcium carbonate (TUMS - DOSED IN MG ELEMENTAL CALCIUM) 500 MG chewable tablet, Chew 2 tablets by mouth daily as needed for indigestion or heartburn. , Disp: , Rfl:  .  COMBIVENT RESPIMAT 20-100 MCG/ACT AERS respimat, 2 (two) times daily. , Disp: , Rfl:  .  dronabinol (MARINOL) 5 MG capsule, Take 1 capsule (5 mg total) by mouth 2 (two) times daily before a meal., Disp: 60 capsule, Rfl: 1 .  EPINEPHrine 0.3 mg/0.3 mL IJ SOAJ injection, Inject 0.3 mg into the muscle once., Disp: , Rfl:  .  fenofibrate 160 MG tablet, TAKE 1 TABLET (160 MG TOTAL) BY MOUTH DAILY., Disp: 90 tablet, Rfl: 1 .  HYDROcodone-homatropine (HYCODAN) 5-1.5 MG/5ML syrup, Take 5 mLs by mouth every 4 (four) hours as needed for cough., Disp: 300 mL, Rfl: 0 .  lactulose (CHRONULAC) 10 GM/15ML solution, Take 15 mLs (10 g total) by mouth 2 (two) times daily as needed for mild constipation., Disp: 473 mL, Rfl: 1 .  lidocaine-prilocaine (EMLA) cream, Apply to affected area once, Disp: 30  g, Rfl: 3 .  LORazepam (ATIVAN) 1 MG tablet, TAKE 1 TABLET (1 MG TOTAL) BY MOUTH EVERY 6 (SIX) HOURS AS NEEDED FOR ANXIETY., Disp: 60 tablet, Rfl: 0 .  Multiple Vitamin (MULTIVITAMIN) tablet, Take 1 tablet by mouth daily., Disp: , Rfl:  .  ondansetron (ZOFRAN) 8 MG tablet, Take 1 tablet (8 mg total) by mouth 2 (two) times daily as needed for refractory nausea / vomiting. Start on day 3 after chemotherapy. (Patient not taking: Reported on 09/03/2019), Disp: 30 tablet, Rfl: 1 .  prochlorperazine (COMPAZINE) 10 MG tablet, Take 1 tablet (10 mg total) by mouth every 6 (six) hours as needed (Nausea or vomiting). (Patient not taking: Reported on 09/03/2019), Disp: 30 tablet, Rfl: 1 .  pyridoxine (B-6) 200 MG tablet, Take 200 mg by mouth daily., Disp: ,  Rfl:  .  solifenacin (VESICARE) 10 MG tablet, Take 10 mg by mouth every evening., Disp: , Rfl:  .  temazepam (RESTORIL) 30 MG capsule, Take 1 capsule (30 mg total) by mouth at bedtime as needed for sleep., Disp: 30 capsule, Rfl: 2 .  UNABLE TO FIND, Med Name: Allergy shots., Disp: , Rfl:   Allergies:  Allergies  Allergen Reactions  . Bee Venom Anaphylaxis  . Clindamycin/Lincomycin Hives    Past Medical History, Surgical history, Social history, and Family History were reviewed and updated.  Review of Systems: Review of Systems  Constitutional: Negative.   HENT:  Negative.   Eyes: Negative.   Respiratory: Positive for cough.   Cardiovascular: Negative.   Gastrointestinal: Negative.   Endocrine: Negative.   Genitourinary: Negative.    Musculoskeletal: Positive for back pain, myalgias and neck pain.  Skin: Positive for itching and rash.  Neurological: Negative.   Hematological: Negative.   Psychiatric/Behavioral: Negative.     Physical Exam:  weight is 191 lb (86.6 kg). His temporal temperature is 97.7 F (36.5 C). His blood pressure is 147/68 (abnormal) and his pulse is 86. His respiration is 20 and oxygen saturation is 100%.   Wt Readings from Last 3 Encounters:  09/04/19 191 lb (86.6 kg)  09/03/19 191 lb 6.4 oz (86.8 kg)  08/20/19 187 lb (84.8 kg)    Physical Exam Vitals reviewed.  HENT:     Head: Normocephalic and atraumatic.  Eyes:     Pupils: Pupils are equal, round, and reactive to light.  Cardiovascular:     Rate and Rhythm: Normal rate and regular rhythm.     Heart sounds: Normal heart sounds.  Pulmonary:     Effort: Pulmonary effort is normal.     Breath sounds: Normal breath sounds.  Abdominal:     General: Bowel sounds are normal.     Palpations: Abdomen is soft.     Comments: Abdominal exam shows a slightly obese abdomen.  He does have an umbilical hernia.  I really cannot palpate his liver at this point.  There is no fluid wave.  There is no inguinal  adenopathy.  There is no splenomegaly.    Musculoskeletal:        General: No tenderness or deformity. Normal range of motion.     Cervical back: Normal range of motion.  Lymphadenopathy:     Cervical: No cervical adenopathy.  Skin:    General: Skin is warm and dry.     Findings: No erythema or rash.  Neurological:     Mental Status: He is alert and oriented to person, place, and time.  Psychiatric:  Behavior: Behavior normal.        Thought Content: Thought content normal.        Judgment: Judgment normal.      Lab Results  Component Value Date   WBC 4.8 09/04/2019   HGB 10.8 (L) 09/04/2019   HCT 31.0 (L) 09/04/2019   MCV 91.7 09/04/2019   PLT 216 09/04/2019     Chemistry      Component Value Date/Time   NA 126 (L) 09/04/2019 1030   NA 135 (L) 07/12/2015 0813   K 4.1 09/04/2019 1030   K 4.5 07/12/2015 0813   CL 94 (L) 09/04/2019 1030   CO2 24 09/04/2019 1030   CO2 24 07/12/2015 0813   BUN 12 09/04/2019 1030   BUN 15.7 07/12/2015 0813   CREATININE 1.05 09/04/2019 1030   CREATININE 1.6 (H) 07/12/2015 0813      Component Value Date/Time   CALCIUM 9.4 09/04/2019 1030   CALCIUM 10.1 07/12/2015 0813   ALKPHOS 60 09/04/2019 1030   ALKPHOS 115 07/12/2015 0813   AST 14 (L) 09/04/2019 1030   AST 22 07/12/2015 0813   ALT 14 09/04/2019 1030   ALT 24 07/12/2015 0813   BILITOT 0.4 09/04/2019 1030   BILITOT 0.46 07/12/2015 0813       Impression and Plan: Taylor Elliott is a 69 year old white male.  He has a past history of renal cell carcinoma and also prostate cancer.  Now, he has a third malignancy.  This is metastatic small cell lung cancer  Again, we will switch therapies on him.  We will try him on cis-platinum/irinotecan.  I think this would be reasonable to try.  I went over all the side effects.  The way our protocols written is that we do treatment day 1 and day 8 every 3 weeks.  I think this would be reasonable.  I do not think he should have a lot of  toxicity with this.  Again, he does have constipation so I do think that him having diarrhea would be that went to her problem.  We will have to watch out for his blood counts.  We will have to watch this closely.  I spent about 45 minutes with the and his wife.  Again it is so nice when she is there.  She really does a great job with him and is very thorough.  We will plan to see him back when he starts his second cycle of treatment.            Volanda Napoleon, MD 2/5/202111:47 AM

## 2019-09-04 NOTE — Patient Instructions (Signed)

## 2019-09-05 ENCOUNTER — Ambulatory Visit: Payer: 59 | Attending: Internal Medicine

## 2019-09-05 ENCOUNTER — Encounter: Payer: Self-pay | Admitting: *Deleted

## 2019-09-05 DIAGNOSIS — Z85528 Personal history of other malignant neoplasm of kidney: Secondary | ICD-10-CM | POA: Diagnosis not present

## 2019-09-05 DIAGNOSIS — C3490 Malignant neoplasm of unspecified part of unspecified bronchus or lung: Secondary | ICD-10-CM | POA: Diagnosis not present

## 2019-09-05 DIAGNOSIS — Z8546 Personal history of malignant neoplasm of prostate: Secondary | ICD-10-CM | POA: Diagnosis not present

## 2019-09-05 DIAGNOSIS — C7931 Secondary malignant neoplasm of brain: Secondary | ICD-10-CM | POA: Diagnosis not present

## 2019-09-05 DIAGNOSIS — K5900 Constipation, unspecified: Secondary | ICD-10-CM | POA: Diagnosis not present

## 2019-09-05 DIAGNOSIS — C787 Secondary malignant neoplasm of liver and intrahepatic bile duct: Secondary | ICD-10-CM | POA: Diagnosis not present

## 2019-09-05 DIAGNOSIS — C7951 Secondary malignant neoplasm of bone: Secondary | ICD-10-CM | POA: Diagnosis not present

## 2019-09-05 DIAGNOSIS — C3401 Malignant neoplasm of right main bronchus: Secondary | ICD-10-CM | POA: Diagnosis not present

## 2019-09-05 DIAGNOSIS — Z5111 Encounter for antineoplastic chemotherapy: Secondary | ICD-10-CM | POA: Diagnosis not present

## 2019-09-05 DIAGNOSIS — Z23 Encounter for immunization: Secondary | ICD-10-CM | POA: Insufficient documentation

## 2019-09-05 DIAGNOSIS — Z510 Encounter for antineoplastic radiation therapy: Secondary | ICD-10-CM | POA: Diagnosis not present

## 2019-09-05 NOTE — Progress Notes (Signed)
   Covid-19 Vaccination Clinic  Name:  Taylor Elliott    MRN: [id number] DOB: July 19, 1951  09/05/2019  Taylor Elliott was observed post Covid-19 immunization for 30 minutes based on pre-vaccination screening without incidence. He was provided with Vaccine Information Sheet and instruction to access the V-Safe system.   Taylor Elliott was instructed to call 911 with any severe reactions post vaccine: Marland Kitchen Difficulty breathing  . Swelling of your face and throat  . A fast heartbeat  . A bad rash all over your body  . Dizziness and weakness    Immunizations Administered    Name Date Dose VIS Date Route   Pfizer COVID-19 Vaccine 09/05/2019  6:04 PM 0.3 mL 07/10/2019 Intramuscular   Manufacturer: [place]   Lot: [id number]   [place]: [id number]

## 2019-09-06 NOTE — Progress Notes (Signed)
  Radiation Oncology         [phone]) [phone] ________________________________  Name: Taylor Elliott MRN: [id number]  Date: 09/07/2019  DOB: 01-10-1951  Simulation Verification Note    ICD-10-CM   1. Secondary malignant neoplasm of brain and spinal cord ([place])  C79.31    C79.49     NARRATIVE: The patient was brought to the treatment unit and placed in the planned treatment position. The clinical setup was verified. Then port films were obtained and uploaded to the radiation oncology medical record software.  The treatment beams were carefully compared against the planned radiation fields. The position location and shape of the radiation fields was reviewed. They targeted volume of tissue appears to be appropriately covered by the radiation beams. Organs at risk appear to be excluded as planned.  Based on my personal review, I approved the simulation verification. The patient's treatment will proceed as planned.  -----------------------------------  Blair Promise, PhD, MD  This document serves as a record of services personally performed by Gery Pray, MD. It was created on his behalf by Clerance Lav, a trained medical scribe. The creation of this record is based on the scribe's personal observations and the provider's statements to them. This document has been checked and approved by the attending provider.

## 2019-09-07 ENCOUNTER — Telehealth: Payer: Self-pay | Admitting: Hematology & Oncology

## 2019-09-07 ENCOUNTER — Other Ambulatory Visit: Payer: Self-pay

## 2019-09-07 ENCOUNTER — Ambulatory Visit
Admission: RE | Admit: 2019-09-07 | Discharge: 2019-09-07 | Disposition: A | Discharge status: Home / Self Care | Payer: 59 | Source: Ambulatory Visit | Attending: Radiation Oncology | Admitting: Radiation Oncology

## 2019-09-07 DIAGNOSIS — Z8546 Personal history of malignant neoplasm of prostate: Secondary | ICD-10-CM | POA: Diagnosis not present

## 2019-09-07 DIAGNOSIS — C7951 Secondary malignant neoplasm of bone: Secondary | ICD-10-CM | POA: Diagnosis not present

## 2019-09-07 DIAGNOSIS — Z5111 Encounter for antineoplastic chemotherapy: Secondary | ICD-10-CM | POA: Diagnosis not present

## 2019-09-07 DIAGNOSIS — C787 Secondary malignant neoplasm of liver and intrahepatic bile duct: Secondary | ICD-10-CM | POA: Diagnosis not present

## 2019-09-07 DIAGNOSIS — C7931 Secondary malignant neoplasm of brain: Secondary | ICD-10-CM | POA: Diagnosis not present

## 2019-09-07 DIAGNOSIS — Z85528 Personal history of other malignant neoplasm of kidney: Secondary | ICD-10-CM | POA: Diagnosis not present

## 2019-09-07 DIAGNOSIS — C3401 Malignant neoplasm of right main bronchus: Secondary | ICD-10-CM | POA: Diagnosis not present

## 2019-09-07 DIAGNOSIS — Z510 Encounter for antineoplastic radiation therapy: Secondary | ICD-10-CM | POA: Diagnosis not present

## 2019-09-07 DIAGNOSIS — K5900 Constipation, unspecified: Secondary | ICD-10-CM | POA: Diagnosis not present

## 2019-09-07 DIAGNOSIS — C3490 Malignant neoplasm of unspecified part of unspecified bronchus or lung: Secondary | ICD-10-CM | POA: Diagnosis not present

## 2019-09-07 DIAGNOSIS — C7949 Secondary malignant neoplasm of other parts of nervous system: Secondary | ICD-10-CM

## 2019-09-07 LAB — LACTATE DEHYDROGENASE: LDH: 115 U/L (ref 98–192)

## 2019-09-07 MED FILL — DEMECLOCYCLINE 150 MG TAB: 150 | 30 days supply | Qty: 120 | Fill #1

## 2019-09-07 NOTE — Telephone Encounter (Signed)
No los 2/5

## 2019-09-08 ENCOUNTER — Ambulatory Visit
Admission: RE | Admit: 2019-09-08 | Discharge: 2019-09-08 | Disposition: A | Discharge status: Home / Self Care | Payer: 59 | Source: Ambulatory Visit | Attending: Radiation Oncology | Admitting: Radiation Oncology

## 2019-09-08 ENCOUNTER — Encounter: Payer: Self-pay | Admitting: *Deleted

## 2019-09-08 ENCOUNTER — Other Ambulatory Visit: Payer: Self-pay

## 2019-09-08 DIAGNOSIS — Z510 Encounter for antineoplastic radiation therapy: Secondary | ICD-10-CM | POA: Diagnosis not present

## 2019-09-08 DIAGNOSIS — C7951 Secondary malignant neoplasm of bone: Secondary | ICD-10-CM | POA: Diagnosis not present

## 2019-09-08 DIAGNOSIS — K5900 Constipation, unspecified: Secondary | ICD-10-CM | POA: Diagnosis not present

## 2019-09-08 DIAGNOSIS — Z85528 Personal history of other malignant neoplasm of kidney: Secondary | ICD-10-CM | POA: Diagnosis not present

## 2019-09-08 DIAGNOSIS — C7931 Secondary malignant neoplasm of brain: Secondary | ICD-10-CM | POA: Diagnosis not present

## 2019-09-08 DIAGNOSIS — Z8546 Personal history of malignant neoplasm of prostate: Secondary | ICD-10-CM | POA: Diagnosis not present

## 2019-09-08 DIAGNOSIS — C3490 Malignant neoplasm of unspecified part of unspecified bronchus or lung: Secondary | ICD-10-CM | POA: Diagnosis not present

## 2019-09-08 DIAGNOSIS — Z5111 Encounter for antineoplastic chemotherapy: Secondary | ICD-10-CM | POA: Diagnosis not present

## 2019-09-08 DIAGNOSIS — C787 Secondary malignant neoplasm of liver and intrahepatic bile duct: Secondary | ICD-10-CM | POA: Diagnosis not present

## 2019-09-09 ENCOUNTER — Other Ambulatory Visit: Payer: Self-pay

## 2019-09-09 ENCOUNTER — Ambulatory Visit
Admission: RE | Admit: 2019-09-09 | Discharge: 2019-09-09 | Disposition: A | Discharge status: Home / Self Care | Payer: 59 | Source: Ambulatory Visit | Attending: Radiation Oncology | Admitting: Radiation Oncology

## 2019-09-09 ENCOUNTER — Ambulatory Visit: Payer: 59

## 2019-09-09 DIAGNOSIS — C787 Secondary malignant neoplasm of liver and intrahepatic bile duct: Secondary | ICD-10-CM | POA: Diagnosis not present

## 2019-09-09 DIAGNOSIS — C7931 Secondary malignant neoplasm of brain: Secondary | ICD-10-CM | POA: Diagnosis not present

## 2019-09-09 DIAGNOSIS — Z8546 Personal history of malignant neoplasm of prostate: Secondary | ICD-10-CM | POA: Diagnosis not present

## 2019-09-09 DIAGNOSIS — K5900 Constipation, unspecified: Secondary | ICD-10-CM | POA: Diagnosis not present

## 2019-09-09 DIAGNOSIS — Z5111 Encounter for antineoplastic chemotherapy: Secondary | ICD-10-CM | POA: Diagnosis not present

## 2019-09-09 DIAGNOSIS — C3490 Malignant neoplasm of unspecified part of unspecified bronchus or lung: Secondary | ICD-10-CM | POA: Diagnosis not present

## 2019-09-09 DIAGNOSIS — C7951 Secondary malignant neoplasm of bone: Secondary | ICD-10-CM | POA: Diagnosis not present

## 2019-09-09 DIAGNOSIS — Z510 Encounter for antineoplastic radiation therapy: Secondary | ICD-10-CM | POA: Diagnosis not present

## 2019-09-09 DIAGNOSIS — Z85528 Personal history of other malignant neoplasm of kidney: Secondary | ICD-10-CM | POA: Diagnosis not present

## 2019-09-10 ENCOUNTER — Ambulatory Visit
Admission: RE | Admit: 2019-09-10 | Discharge: 2019-09-10 | Disposition: A | Discharge status: Home / Self Care | Payer: 59 | Source: Ambulatory Visit | Attending: Radiation Oncology | Admitting: Radiation Oncology

## 2019-09-10 ENCOUNTER — Other Ambulatory Visit: Payer: Self-pay

## 2019-09-10 DIAGNOSIS — K5900 Constipation, unspecified: Secondary | ICD-10-CM | POA: Diagnosis not present

## 2019-09-10 DIAGNOSIS — Z8546 Personal history of malignant neoplasm of prostate: Secondary | ICD-10-CM | POA: Diagnosis not present

## 2019-09-10 DIAGNOSIS — Z510 Encounter for antineoplastic radiation therapy: Secondary | ICD-10-CM | POA: Diagnosis not present

## 2019-09-10 DIAGNOSIS — C7931 Secondary malignant neoplasm of brain: Secondary | ICD-10-CM | POA: Diagnosis not present

## 2019-09-10 DIAGNOSIS — C7951 Secondary malignant neoplasm of bone: Secondary | ICD-10-CM | POA: Diagnosis not present

## 2019-09-10 DIAGNOSIS — Z5111 Encounter for antineoplastic chemotherapy: Secondary | ICD-10-CM | POA: Diagnosis not present

## 2019-09-10 DIAGNOSIS — C3490 Malignant neoplasm of unspecified part of unspecified bronchus or lung: Secondary | ICD-10-CM | POA: Diagnosis not present

## 2019-09-10 DIAGNOSIS — Z85528 Personal history of other malignant neoplasm of kidney: Secondary | ICD-10-CM | POA: Diagnosis not present

## 2019-09-10 DIAGNOSIS — C787 Secondary malignant neoplasm of liver and intrahepatic bile duct: Secondary | ICD-10-CM | POA: Diagnosis not present

## 2019-09-11 ENCOUNTER — Inpatient Hospital Stay: Payer: 59

## 2019-09-11 ENCOUNTER — Ambulatory Visit
Admission: RE | Admit: 2019-09-11 | Discharge: 2019-09-11 | Disposition: A | Discharge status: Home / Self Care | Payer: 59 | Source: Ambulatory Visit | Attending: Radiation Oncology | Admitting: Radiation Oncology

## 2019-09-11 ENCOUNTER — Other Ambulatory Visit: Payer: Self-pay

## 2019-09-11 ENCOUNTER — Encounter: Payer: Self-pay | Admitting: *Deleted

## 2019-09-11 DIAGNOSIS — C7951 Secondary malignant neoplasm of bone: Secondary | ICD-10-CM | POA: Diagnosis not present

## 2019-09-11 DIAGNOSIS — Z8546 Personal history of malignant neoplasm of prostate: Secondary | ICD-10-CM | POA: Diagnosis not present

## 2019-09-11 DIAGNOSIS — C787 Secondary malignant neoplasm of liver and intrahepatic bile duct: Secondary | ICD-10-CM | POA: Diagnosis not present

## 2019-09-11 DIAGNOSIS — C3491 Malignant neoplasm of unspecified part of right bronchus or lung: Secondary | ICD-10-CM

## 2019-09-11 DIAGNOSIS — Z85528 Personal history of other malignant neoplasm of kidney: Secondary | ICD-10-CM | POA: Diagnosis not present

## 2019-09-11 DIAGNOSIS — K5900 Constipation, unspecified: Secondary | ICD-10-CM | POA: Diagnosis not present

## 2019-09-11 DIAGNOSIS — Z510 Encounter for antineoplastic radiation therapy: Secondary | ICD-10-CM | POA: Diagnosis not present

## 2019-09-11 DIAGNOSIS — C3490 Malignant neoplasm of unspecified part of unspecified bronchus or lung: Secondary | ICD-10-CM | POA: Diagnosis not present

## 2019-09-11 DIAGNOSIS — C7931 Secondary malignant neoplasm of brain: Secondary | ICD-10-CM | POA: Diagnosis not present

## 2019-09-11 DIAGNOSIS — C3401 Malignant neoplasm of right main bronchus: Secondary | ICD-10-CM | POA: Diagnosis not present

## 2019-09-11 DIAGNOSIS — Z5111 Encounter for antineoplastic chemotherapy: Secondary | ICD-10-CM | POA: Diagnosis not present

## 2019-09-11 LAB — CBC WITH DIFFERENTIAL (CANCER CENTER ONLY)
Abs Immature Granulocytes: 0.03 10*3/uL (ref 0.00–0.07)
Basophils Absolute: 0 10*3/uL (ref 0.0–0.1)
Basophils Relative: 1 %
Eosinophils Absolute: 0.1 10*3/uL (ref 0.0–0.5)
Eosinophils Relative: 2 %
HCT: 30.6 % — ABNORMAL LOW (ref 39.0–52.0)
Hemoglobin: 10.5 g/dL — ABNORMAL LOW (ref 13.0–17.0)
Immature Granulocytes: 1 %
Lymphocytes Relative: 9 %
Lymphs Abs: 0.4 10*3/uL — ABNORMAL LOW (ref 0.7–4.0)
MCH: 31.7 pg (ref 26.0–34.0)
MCHC: 34.3 g/dL (ref 30.0–36.0)
MCV: 92.4 fL (ref 80.0–100.0)
Monocytes Absolute: 0.5 10*3/uL (ref 0.1–1.0)
Monocytes Relative: 12 %
Neutro Abs: 3.2 10*3/uL (ref 1.7–7.7)
Neutrophils Relative %: 75 %
Platelet Count: 184 10*3/uL (ref 150–400)
RBC: 3.31 MIL/uL — ABNORMAL LOW (ref 4.22–5.81)
RDW: 13.9 % (ref 11.5–15.5)
WBC Count: 4.2 10*3/uL (ref 4.0–10.5)
nRBC: 0 % (ref 0.0–0.2)

## 2019-09-11 LAB — CMP (CANCER CENTER ONLY)
ALT: 15 U/L (ref 0–44)
AST: 13 U/L — ABNORMAL LOW (ref 15–41)
Albumin: 3.6 g/dL (ref 3.5–5.0)
Alkaline Phosphatase: 67 U/L (ref 38–126)
Anion gap: 7 (ref 5–15)
BUN: 21 mg/dL (ref 8–23)
CO2: 26 mmol/L (ref 22–32)
Calcium: 9.4 mg/dL (ref 8.9–10.3)
Chloride: 97 mmol/L — ABNORMAL LOW (ref 98–111)
Creatinine: 1.22 mg/dL (ref 0.61–1.24)
GFR, Est AFR Am: >60 mL/min (ref >60)
GFR, Estimated: >60 mL/min (ref >60)
Glucose, Bld: 94 mg/dL (ref 70–99)
Potassium: 4.6 mmol/L (ref 3.5–5.1)
Sodium: 130 mmol/L — ABNORMAL LOW (ref 135–145)
Total Bilirubin: 0.3 mg/dL (ref 0.3–1.2)
Total Protein: 5.8 g/dL — ABNORMAL LOW (ref 6.5–8.1)

## 2019-09-11 MED ORDER — SODIUM CHLORIDE 0.9 % IV SOLN
150.0000 mg | Freq: Once | INTRAVENOUS | Status: AC
  Administered 2019-09-11: 150 mg via INTRAVENOUS
  Filled 2019-09-11: qty 5

## 2019-09-11 MED ORDER — SODIUM CHLORIDE 0.9 % IV SOLN
52.0000 mg/m2 | Freq: Once | INTRAVENOUS | Status: AC
  Administered 2019-09-11: 100 mg via INTRAVENOUS
  Filled 2019-09-11: qty 5

## 2019-09-11 MED ORDER — HEPARIN SOD (PORK) LOCK FLUSH 100 UNIT/ML IV SOLN
500.0000 [IU] | Freq: Once | INTRAVENOUS | Status: AC | PRN
  Administered 2019-09-11: 500 [IU]
  Filled 2019-09-11: qty 5

## 2019-09-11 MED ORDER — LOPERAMIDE HCL 2 MG PO TABS: ORAL_TABLET | ORAL | 1 refills | Status: DC

## 2019-09-11 MED ORDER — LIDOCAINE-PRILOCAINE 2.5-2.5 % EX CREA: TOPICAL_CREAM | CUTANEOUS | 3 refills | Status: DC

## 2019-09-11 MED ORDER — SODIUM CHLORIDE 0.9 % IV SOLN
30.0000 mg/m2 | Freq: Once | INTRAVENOUS | Status: AC
  Administered 2019-09-11: 61 mg via INTRAVENOUS
  Filled 2019-09-11: qty 61

## 2019-09-11 MED ORDER — DEXAMETHASONE SODIUM PHOSPHATE 10 MG/ML IJ SOLN
10.0000 mg | Freq: Once | INTRAMUSCULAR | Status: AC
  Administered 2019-09-11: 10 mg via INTRAVENOUS

## 2019-09-11 MED ORDER — DEXAMETHASONE 4 MG PO TABS: ORAL_TABLET | ORAL | 1 refills | Status: DC

## 2019-09-11 MED ORDER — PALONOSETRON HCL INJECTION 0.25 MG/5ML
INTRAVENOUS | Status: AC
  Filled 2019-09-11: qty 5

## 2019-09-11 MED ORDER — SODIUM CHLORIDE 0.9% FLUSH
10.0000 mL | INTRAVENOUS | Status: DC | PRN
  Administered 2019-09-11: 10 mL
  Filled 2019-09-11: qty 10

## 2019-09-11 MED ORDER — PALONOSETRON HCL INJECTION 0.25 MG/5ML
0.2500 mg | Freq: Once | INTRAVENOUS | Status: AC
  Administered 2019-09-11: 0.25 mg via INTRAVENOUS

## 2019-09-11 MED ORDER — POTASSIUM CHLORIDE 2 MEQ/ML IV SOLN
Freq: Once | INTRAVENOUS | Status: AC
  Filled 2019-09-11: qty 10

## 2019-09-11 MED ORDER — ONDANSETRON HCL 8 MG PO TABS: 8.0000 mg | ORAL_TABLET | Freq: Two times a day (BID) | ORAL | 1 refills | Status: DC | PRN

## 2019-09-11 MED ORDER — DEXAMETHASONE SODIUM PHOSPHATE 10 MG/ML IJ SOLN
INTRAMUSCULAR | Status: AC
  Filled 2019-09-11: qty 1

## 2019-09-11 MED ORDER — LORAZEPAM 0.5 MG PO TABS: 0.5000 mg | ORAL_TABLET | Freq: Four times a day (QID) | ORAL | 0 refills | Status: DC | PRN

## 2019-09-11 MED ORDER — SODIUM CHLORIDE 0.9 % IV SOLN
Freq: Once | INTRAVENOUS | Status: AC
  Filled 2019-09-11: qty 250

## 2019-09-11 MED ORDER — PROCHLORPERAZINE MALEATE 10 MG PO TABS: 10.0000 mg | ORAL_TABLET | Freq: Four times a day (QID) | ORAL | 1 refills | Status: DC | PRN

## 2019-09-11 MED FILL — DEXAMETHASONE 4 MG TABLET: 4 | 15 days supply | Qty: 30 | Fill #0

## 2019-09-11 MED FILL — LOPERAMIDE 2 MG CAPSULE: 2 | 10 days supply | Qty: 100 | Fill #0

## 2019-09-11 MED FILL — LIDOCAINE-PRILOCAINE 2.5-2.: 2.5-2.5 | 14 days supply | Qty: 30 | Fill #0

## 2019-09-11 MED FILL — PROCHLORPERAZINE 10 MG TAB: 10 | 7 days supply | Qty: 30 | Fill #0

## 2019-09-11 MED FILL — ONDANSETRON HCL 8 MG TABLET: 8 | 15 days supply | Qty: 30 | Fill #0

## 2019-09-11 NOTE — Progress Notes (Signed)
Pt labs drawn today  to recheck NA+ level. Per MD, proceed with treatment with labs from 2/5.

## 2019-09-11 NOTE — Progress Notes (Signed)
Patient starting new treatment today.

## 2019-09-11 NOTE — Patient Instructions (Signed)
[place] [facility] Discharge Instructions for Patients Receiving Chemotherapy  Today you received the following chemotherapy agents  Cisplatin and irrinotecan   To help prevent nausea and vomiting after your treatment, we encourage you to take your nausea medication  As directed   If you develop nausea and vomiting that is not controlled by your nausea medication, call the clinic.   BELOW ARE SYMPTOMS THAT SHOULD BE REPORTED IMMEDIATELY:  *FEVER GREATER THAN 100.5 F  *CHILLS WITH OR WITHOUT FEVER  NAUSEA AND VOMITING THAT IS NOT CONTROLLED WITH YOUR NAUSEA MEDICATION  *UNUSUAL SHORTNESS OF BREATH  *UNUSUAL BRUISING OR BLEEDING  TENDERNESS IN MOUTH AND THROAT WITH OR WITHOUT PRESENCE OF ULCERS  *URINARY PROBLEMS  *BOWEL PROBLEMS  UNUSUAL RASH Items with * indicate a potential emergency and should be followed up as soon as possible.  Feel free to call the clinic should you have any questions or concerns. The clinic phone number is [phone].  Please show the [place] at check-in to the Emergency Department and triage nurse.

## 2019-09-14 ENCOUNTER — Ambulatory Visit
Admission: RE | Admit: 2019-09-14 | Discharge: 2019-09-14 | Disposition: A | Discharge status: Home / Self Care | Payer: 59 | Source: Ambulatory Visit | Attending: Radiation Oncology | Admitting: Radiation Oncology

## 2019-09-14 ENCOUNTER — Other Ambulatory Visit: Payer: Self-pay

## 2019-09-14 DIAGNOSIS — Z5111 Encounter for antineoplastic chemotherapy: Secondary | ICD-10-CM | POA: Diagnosis not present

## 2019-09-14 DIAGNOSIS — C787 Secondary malignant neoplasm of liver and intrahepatic bile duct: Secondary | ICD-10-CM | POA: Diagnosis not present

## 2019-09-14 DIAGNOSIS — Z510 Encounter for antineoplastic radiation therapy: Secondary | ICD-10-CM | POA: Diagnosis not present

## 2019-09-14 DIAGNOSIS — Z8546 Personal history of malignant neoplasm of prostate: Secondary | ICD-10-CM | POA: Diagnosis not present

## 2019-09-14 DIAGNOSIS — C7951 Secondary malignant neoplasm of bone: Secondary | ICD-10-CM | POA: Diagnosis not present

## 2019-09-14 DIAGNOSIS — Z85528 Personal history of other malignant neoplasm of kidney: Secondary | ICD-10-CM | POA: Diagnosis not present

## 2019-09-14 DIAGNOSIS — C3490 Malignant neoplasm of unspecified part of unspecified bronchus or lung: Secondary | ICD-10-CM | POA: Diagnosis not present

## 2019-09-14 DIAGNOSIS — C7931 Secondary malignant neoplasm of brain: Secondary | ICD-10-CM | POA: Diagnosis not present

## 2019-09-14 DIAGNOSIS — K5900 Constipation, unspecified: Secondary | ICD-10-CM | POA: Diagnosis not present

## 2019-09-15 ENCOUNTER — Other Ambulatory Visit: Payer: Self-pay

## 2019-09-15 ENCOUNTER — Ambulatory Visit (INDEPENDENT_AMBULATORY_CARE_PROVIDER_SITE_OTHER): Payer: 59 | Admitting: Otolaryngology

## 2019-09-15 ENCOUNTER — Ambulatory Visit
Admission: RE | Admit: 2019-09-15 | Discharge: 2019-09-15 | Disposition: A | Discharge status: Home / Self Care | Payer: 59 | Source: Ambulatory Visit | Attending: Radiation Oncology | Admitting: Radiation Oncology

## 2019-09-15 ENCOUNTER — Encounter (INDEPENDENT_AMBULATORY_CARE_PROVIDER_SITE_OTHER): Payer: Self-pay | Admitting: Otolaryngology

## 2019-09-15 VITALS — Temp 98.1°F

## 2019-09-15 DIAGNOSIS — Z510 Encounter for antineoplastic radiation therapy: Secondary | ICD-10-CM | POA: Diagnosis not present

## 2019-09-15 DIAGNOSIS — H6522 Chronic serous otitis media, left ear: Secondary | ICD-10-CM | POA: Diagnosis not present

## 2019-09-15 DIAGNOSIS — Z85528 Personal history of other malignant neoplasm of kidney: Secondary | ICD-10-CM | POA: Diagnosis not present

## 2019-09-15 DIAGNOSIS — H906 Mixed conductive and sensorineural hearing loss, bilateral: Secondary | ICD-10-CM | POA: Diagnosis not present

## 2019-09-15 DIAGNOSIS — C3490 Malignant neoplasm of unspecified part of unspecified bronchus or lung: Secondary | ICD-10-CM | POA: Diagnosis not present

## 2019-09-15 DIAGNOSIS — C787 Secondary malignant neoplasm of liver and intrahepatic bile duct: Secondary | ICD-10-CM | POA: Diagnosis not present

## 2019-09-15 DIAGNOSIS — C7951 Secondary malignant neoplasm of bone: Secondary | ICD-10-CM | POA: Diagnosis not present

## 2019-09-15 DIAGNOSIS — C7931 Secondary malignant neoplasm of brain: Secondary | ICD-10-CM | POA: Diagnosis not present

## 2019-09-15 DIAGNOSIS — Z5111 Encounter for antineoplastic chemotherapy: Secondary | ICD-10-CM | POA: Diagnosis not present

## 2019-09-15 DIAGNOSIS — K5900 Constipation, unspecified: Secondary | ICD-10-CM | POA: Diagnosis not present

## 2019-09-15 DIAGNOSIS — Z8546 Personal history of malignant neoplasm of prostate: Secondary | ICD-10-CM | POA: Diagnosis not present
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
## 2019-09-16 ENCOUNTER — Other Ambulatory Visit: Payer: Self-pay

## 2019-09-16 ENCOUNTER — Ambulatory Visit
Admission: RE | Admit: 2019-09-16 | Discharge: 2019-09-16 | Disposition: A | Discharge status: Home / Self Care | Payer: 59 | Source: Ambulatory Visit | Attending: Radiation Oncology | Admitting: Radiation Oncology

## 2019-09-16 DIAGNOSIS — C787 Secondary malignant neoplasm of liver and intrahepatic bile duct: Secondary | ICD-10-CM | POA: Diagnosis not present

## 2019-09-16 DIAGNOSIS — Z8546 Personal history of malignant neoplasm of prostate: Secondary | ICD-10-CM | POA: Diagnosis not present

## 2019-09-16 DIAGNOSIS — K5900 Constipation, unspecified: Secondary | ICD-10-CM | POA: Diagnosis not present

## 2019-09-16 DIAGNOSIS — C7931 Secondary malignant neoplasm of brain: Secondary | ICD-10-CM | POA: Diagnosis not present

## 2019-09-16 DIAGNOSIS — C7951 Secondary malignant neoplasm of bone: Secondary | ICD-10-CM | POA: Diagnosis not present

## 2019-09-16 DIAGNOSIS — Z85528 Personal history of other malignant neoplasm of kidney: Secondary | ICD-10-CM | POA: Diagnosis not present

## 2019-09-16 DIAGNOSIS — Z510 Encounter for antineoplastic radiation therapy: Secondary | ICD-10-CM | POA: Diagnosis not present

## 2019-09-16 DIAGNOSIS — Z5111 Encounter for antineoplastic chemotherapy: Secondary | ICD-10-CM | POA: Diagnosis not present

## 2019-09-16 DIAGNOSIS — C3490 Malignant neoplasm of unspecified part of unspecified bronchus or lung: Secondary | ICD-10-CM | POA: Diagnosis not present

## 2019-09-17 ENCOUNTER — Telehealth: Payer: Self-pay | Admitting: Hematology & Oncology

## 2019-09-17 ENCOUNTER — Ambulatory Visit: Payer: 59

## 2019-09-18 ENCOUNTER — Inpatient Hospital Stay: Payer: 59

## 2019-09-18 ENCOUNTER — Ambulatory Visit: Payer: 59

## 2019-09-18 ENCOUNTER — Encounter (INDEPENDENT_AMBULATORY_CARE_PROVIDER_SITE_OTHER): Payer: Self-pay

## 2019-09-18 ENCOUNTER — Ambulatory Visit
Admission: RE | Admit: 2019-09-18 | Discharge: 2019-09-18 | Disposition: A | Discharge status: Home / Self Care | Payer: 59 | Source: Ambulatory Visit | Attending: Radiation Oncology | Admitting: Radiation Oncology

## 2019-09-18 ENCOUNTER — Encounter: Payer: Self-pay | Admitting: *Deleted

## 2019-09-18 ENCOUNTER — Other Ambulatory Visit: Payer: Self-pay

## 2019-09-18 VITALS — BP 108/55 | HR 82 | Temp 98.2°F | Resp 17

## 2019-09-18 DIAGNOSIS — C7931 Secondary malignant neoplasm of brain: Secondary | ICD-10-CM | POA: Diagnosis not present

## 2019-09-18 DIAGNOSIS — Z510 Encounter for antineoplastic radiation therapy: Secondary | ICD-10-CM | POA: Diagnosis not present

## 2019-09-18 DIAGNOSIS — C787 Secondary malignant neoplasm of liver and intrahepatic bile duct: Secondary | ICD-10-CM | POA: Diagnosis not present

## 2019-09-18 DIAGNOSIS — C3490 Malignant neoplasm of unspecified part of unspecified bronchus or lung: Secondary | ICD-10-CM | POA: Diagnosis not present

## 2019-09-18 DIAGNOSIS — L03119 Cellulitis of unspecified part of limb: Secondary | ICD-10-CM

## 2019-09-18 DIAGNOSIS — C3491 Malignant neoplasm of unspecified part of right bronchus or lung: Secondary | ICD-10-CM

## 2019-09-18 DIAGNOSIS — Z8546 Personal history of malignant neoplasm of prostate: Secondary | ICD-10-CM | POA: Diagnosis not present

## 2019-09-18 DIAGNOSIS — Z85528 Personal history of other malignant neoplasm of kidney: Secondary | ICD-10-CM | POA: Diagnosis not present

## 2019-09-18 DIAGNOSIS — C7951 Secondary malignant neoplasm of bone: Secondary | ICD-10-CM | POA: Diagnosis not present

## 2019-09-18 DIAGNOSIS — Z5111 Encounter for antineoplastic chemotherapy: Secondary | ICD-10-CM | POA: Diagnosis not present

## 2019-09-18 DIAGNOSIS — K5900 Constipation, unspecified: Secondary | ICD-10-CM | POA: Diagnosis not present

## 2019-09-18 LAB — CBC WITH DIFFERENTIAL (CANCER CENTER ONLY)
Abs Immature Granulocytes: 0.05 10*3/uL (ref 0.00–0.07)
Basophils Absolute: 0 10*3/uL (ref 0.0–0.1)
Basophils Relative: 1 %
Eosinophils Absolute: 0.1 10*3/uL (ref 0.0–0.5)
Eosinophils Relative: 3 %
HCT: 32.2 % — ABNORMAL LOW (ref 39.0–52.0)
Hemoglobin: 10.8 g/dL — ABNORMAL LOW (ref 13.0–17.0)
Immature Granulocytes: 1 %
Lymphocytes Relative: 10 %
Lymphs Abs: 0.4 10*3/uL — ABNORMAL LOW (ref 0.7–4.0)
MCH: 31.3 pg (ref 26.0–34.0)
MCHC: 33.5 g/dL (ref 30.0–36.0)
MCV: 93.3 fL (ref 80.0–100.0)
Monocytes Absolute: 0.3 10*3/uL (ref 0.1–1.0)
Monocytes Relative: 8 %
Neutro Abs: 2.7 10*3/uL (ref 1.7–7.7)
Neutrophils Relative %: 77 %
Platelet Count: 198 10*3/uL (ref 150–400)
RBC: 3.45 MIL/uL — ABNORMAL LOW (ref 4.22–5.81)
RDW: 13.8 % (ref 11.5–15.5)
WBC Count: 3.6 10*3/uL — ABNORMAL LOW (ref 4.0–10.5)
nRBC: 0 % (ref 0.0–0.2)

## 2019-09-18 LAB — CMP (CANCER CENTER ONLY)
ALT: 16 U/L (ref 0–44)
AST: 13 U/L — ABNORMAL LOW (ref 15–41)
Albumin: 3.7 g/dL (ref 3.5–5.0)
Alkaline Phosphatase: 59 U/L (ref 38–126)
Anion gap: 7 (ref 5–15)
BUN: 21 mg/dL (ref 8–23)
CO2: 25 mmol/L (ref 22–32)
Calcium: 9.4 mg/dL (ref 8.9–10.3)
Chloride: 105 mmol/L (ref 98–111)
Creatinine: 1.17 mg/dL (ref 0.61–1.24)
GFR, Est AFR Am: >60 mL/min
GFR, Estimated: >60 mL/min
Glucose, Bld: 99 mg/dL (ref 70–99)
Potassium: 4.2 mmol/L (ref 3.5–5.1)
Sodium: 137 mmol/L (ref 135–145)
Total Bilirubin: 0.3 mg/dL (ref 0.3–1.2)
Total Protein: 6 g/dL — ABNORMAL LOW (ref 6.5–8.1)

## 2019-09-18 MED ORDER — SODIUM CHLORIDE 0.9 % IV SOLN
Freq: Once | INTRAVENOUS | Status: AC
  Filled 2019-09-18: qty 250

## 2019-09-18 MED ORDER — HEPARIN SOD (PORK) LOCK FLUSH 100 UNIT/ML IV SOLN
500.0000 [IU] | Freq: Once | INTRAVENOUS | Status: AC | PRN
  Administered 2019-09-18: 500 [IU]
  Filled 2019-09-18: qty 5

## 2019-09-18 MED ORDER — SODIUM CHLORIDE 0.9% FLUSH
10.0000 mL | Freq: Once | INTRAVENOUS | Status: AC | PRN
  Administered 2019-09-18: 10 mL
  Filled 2019-09-18: qty 10

## 2019-09-18 NOTE — Patient Instructions (Signed)
Dehydration, Adult Dehydration is a condition in which there is not enough water or other fluids in the body. This happens when a person loses more fluids than he or she takes in. Important organs, such as the kidneys, brain, and heart, cannot function without a proper amount of fluids. Any loss of fluids from the body can lead to dehydration. Dehydration can be mild, moderate, or severe. It should be treated right away to prevent it from becoming severe. What are the causes? Dehydration may be caused by:  Conditions that cause loss of water or other fluids, such as diarrhea, vomiting, or sweating or urinating a lot.  Not drinking enough fluids, especially when you are ill or doing activities that require a lot of energy.  Other illnesses and conditions, such as fever or infection.  Certain medicines, such as medicines that remove excess fluid from the body (diuretics).  Lack of safe drinking water.  Not being able to get enough water and food. What increases the risk? The following factors may make you more likely to develop this condition:  Having a long-term (chronic) illness that has not been treated properly, such as diabetes, heart disease, or kidney disease.  Being 65 years of age or older.  Having a disability.  Living in a place that is high in altitude, where thinner, drier air causes more fluid loss.  Doing exercises that put stress on your body for a long time (endurance sports). What are the signs or symptoms? Symptoms of dehydration depend on how severe it is. Mild or moderate dehydration  Thirst.  Dry lips or dry mouth.  Dizziness or light-headedness, especially when standing up from a seated position.  Muscle cramps.  Dark urine. Urine may be the color of tea.  Less urine or tears produced than usual.  Headache. Severe dehydration  Changes in skin. Your skin may be cold and clammy, blotchy, or pale. Your skin also may not return to normal after being  lightly pinched and released.  Little or no tears, urine, or sweat.  Changes in vital signs, such as rapid breathing and low blood pressure. Your pulse may be weak or may be faster than 100 beats a minute when you are sitting still.  Other changes, such as: ? Feeling very thirsty. ? Sunken eyes. ? Cold hands and feet. ? Confusion. ? Being very tired (lethargic) or having trouble waking from sleep. ? Short-term weight loss. ? Loss of consciousness. How is this diagnosed? This condition is diagnosed based on your symptoms and a physical exam. You may have blood and urine tests to help confirm the diagnosis. How is this treated? Treatment for this condition depends on how severe it is. Treatment should be started right away. Do not wait until dehydration becomes severe. Severe dehydration is an emergency and needs to be treated in a hospital.  Mild or moderate dehydration can be treated at home. You may be asked to: ? Drink more fluids. ? Drink an oral rehydration solution (ORS). This drink helps restore proper amounts of fluids and salts and minerals in the blood (electrolytes).  Severe dehydration can be treated: ? With IV fluids. ? By correcting abnormal levels of electrolytes. This is often done by giving electrolytes through a tube that is passed through your nose and into your stomach (nasogastric tube, or NG tube). ? By treating the underlying cause of dehydration. Follow these instructions at home: Oral rehydration solution If told by your health care provider, drink an ORS:  Make   an ORS by following instructions on the package.  Start by drinking small amounts, about  cup (120 mL) every 5-10 minutes.  Slowly increase how much you drink until you have taken the amount recommended by your health care provider. Eating and drinking         Drink enough clear fluid to keep your urine pale yellow. If you were told to drink an ORS, finish the ORS first and then start slowly  drinking other clear fluids. Drink fluids such as: ? Water. Do not drink only water. Doing that can lead to hyponatremia, which is having too little salt (sodium) in the body. ? Water from ice chips you suck on. ? Fruit juice that you have added water to (diluted fruit juice). ? Low-calorie sports drinks.  Eat foods that contain a healthy balance of electrolytes, such as bananas, oranges, potatoes, tomatoes, and spinach.  Do not drink alcohol.  Avoid the following: ? Drinks that contain a lot of sugar. These include high-calorie sports drinks, fruit juice that is not diluted, and soda. ? Caffeine. ? Foods that are greasy or contain a lot of fat or sugar. General instructions  Take over-the-counter and prescription medicines only as told by your health care provider.  Do not take sodium tablets. Doing that can lead to having too much sodium in the body (hypernatremia).  Return to your normal activities as told by your health care provider. Ask your health care provider what activities are safe for you.  Keep all follow-up visits as told by your health care provider. This is important. Contact a health care provider if:  You have muscle cramps, pain, or discomfort, such as: ? Pain in your abdomen and the pain gets worse or stays in one area (localizes). ? Stiff neck.  You have a rash.  You are more irritable than usual.  You are sleepier or have a harder time waking than usual.  You feel weak or dizzy.  You feel very thirsty. Get help right away if you have:  Any symptoms of severe dehydration.  Symptoms of vomiting, such as: ? You cannot eat or drink without vomiting. ? Vomiting gets worse or does not go away. ? Vomit includes blood or green matter (bile).  Symptoms that get worse with treatment.  A fever.  A severe headache.  Problems with urination or bowel movements, such as: ? Diarrhea that gets worse or does not go away. ? Blood in your stool (feces). This  may cause stool to look black and tarry. ? Not urinating, or urinating only a small amount of very dark urine, within 6-8 hours.  Trouble breathing. These symptoms may represent a serious problem that is an emergency. Do not wait to see if the symptoms will go away. Get medical help right away. Call your local emergency services (911 in the [place] Do not drive yourself to the hospital. Summary  Dehydration is a condition in which there is not enough water or other fluids in the body. This happens when a person loses more fluids than he or she takes in.  Treatment for this condition depends on how severe it is. Treatment should be started right away. Do not wait until dehydration becomes severe.  Drink enough clear fluid to keep your urine pale yellow. If you were told to drink an oral rehydration solution (ORS), finish the ORS first and then start slowly drinking other clear fluids.  Take over-the-counter and prescription medicines only as told by your health care   provider.  Get help right away if you have any symptoms of severe dehydration. This information is not intended to replace advice given to you by your health care provider. Make sure you discuss any questions you have with your health care provider. Document Revised: 02/26/2019 Document Reviewed: 02/26/2019 Elsevier Patient Education  2020 [employer] Inc[employer]   

## 2019-09-21 ENCOUNTER — Other Ambulatory Visit: Payer: Self-pay

## 2019-09-21 ENCOUNTER — Inpatient Hospital Stay: Payer: 59

## 2019-09-21 ENCOUNTER — Ambulatory Visit: Payer: 59

## 2019-09-21 ENCOUNTER — Encounter: Payer: Self-pay | Admitting: *Deleted

## 2019-09-21 ENCOUNTER — Inpatient Hospital Stay: Payer: 59 | Attending: Hematology & Oncology

## 2019-09-21 ENCOUNTER — Encounter: Payer: Self-pay | Admitting: Radiation Oncology

## 2019-09-21 VITALS — BP 103/60 | HR 81 | Temp 98.0°F | Resp 17

## 2019-09-21 DIAGNOSIS — C7931 Secondary malignant neoplasm of brain: Secondary | ICD-10-CM | POA: Insufficient documentation

## 2019-09-21 DIAGNOSIS — K5900 Constipation, unspecified: Secondary | ICD-10-CM | POA: Insufficient documentation

## 2019-09-21 DIAGNOSIS — C3491 Malignant neoplasm of unspecified part of right bronchus or lung: Secondary | ICD-10-CM

## 2019-09-21 DIAGNOSIS — C7951 Secondary malignant neoplasm of bone: Secondary | ICD-10-CM | POA: Insufficient documentation

## 2019-09-21 DIAGNOSIS — Z510 Encounter for antineoplastic radiation therapy: Secondary | ICD-10-CM | POA: Insufficient documentation

## 2019-09-21 DIAGNOSIS — Z85528 Personal history of other malignant neoplasm of kidney: Secondary | ICD-10-CM | POA: Insufficient documentation

## 2019-09-21 DIAGNOSIS — Z5111 Encounter for antineoplastic chemotherapy: Secondary | ICD-10-CM | POA: Diagnosis not present

## 2019-09-21 DIAGNOSIS — C3490 Malignant neoplasm of unspecified part of unspecified bronchus or lung: Secondary | ICD-10-CM | POA: Insufficient documentation

## 2019-09-21 DIAGNOSIS — Z9079 Acquired absence of other genital organ(s): Secondary | ICD-10-CM | POA: Insufficient documentation

## 2019-09-21 DIAGNOSIS — Z8546 Personal history of malignant neoplasm of prostate: Secondary | ICD-10-CM | POA: Insufficient documentation

## 2019-09-21 DIAGNOSIS — C787 Secondary malignant neoplasm of liver and intrahepatic bile duct: Secondary | ICD-10-CM | POA: Insufficient documentation

## 2019-09-21 DIAGNOSIS — Z905 Acquired absence of kidney: Secondary | ICD-10-CM | POA: Insufficient documentation

## 2019-09-21 DIAGNOSIS — Z79899 Other long term (current) drug therapy: Secondary | ICD-10-CM | POA: Insufficient documentation

## 2019-09-21 DIAGNOSIS — Z7982 Long term (current) use of aspirin: Secondary | ICD-10-CM | POA: Insufficient documentation

## 2019-09-21 LAB — CBC WITH DIFFERENTIAL (CANCER CENTER ONLY)
Abs Immature Granulocytes: 0.02 10*3/uL (ref 0.00–0.07)
Basophils Absolute: 0 10*3/uL (ref 0.0–0.1)
Basophils Relative: 1 %
Eosinophils Absolute: 0.1 10*3/uL (ref 0.0–0.5)
Eosinophils Relative: 3 %
HCT: 31.7 % — ABNORMAL LOW (ref 39.0–52.0)
Hemoglobin: 10.7 g/dL — ABNORMAL LOW (ref 13.0–17.0)
Immature Granulocytes: 1 %
Lymphocytes Relative: 11 %
Lymphs Abs: 0.3 10*3/uL — ABNORMAL LOW (ref 0.7–4.0)
MCH: 31.5 pg (ref 26.0–34.0)
MCHC: 33.8 g/dL (ref 30.0–36.0)
MCV: 93.2 fL (ref 80.0–100.0)
Monocytes Absolute: 0.3 10*3/uL (ref 0.1–1.0)
Monocytes Relative: 11 %
Neutro Abs: 2.2 10*3/uL (ref 1.7–7.7)
Neutrophils Relative %: 73 %
Platelet Count: 179 10*3/uL (ref 150–400)
RBC: 3.4 MIL/uL — ABNORMAL LOW (ref 4.22–5.81)
RDW: 14 % (ref 11.5–15.5)
WBC Count: 2.9 10*3/uL — ABNORMAL LOW (ref 4.0–10.5)
nRBC: 0 % (ref 0.0–0.2)

## 2019-09-21 LAB — CMP (CANCER CENTER ONLY)
ALT: 17 U/L (ref 0–44)
AST: 13 U/L — ABNORMAL LOW (ref 15–41)
Albumin: 3.6 g/dL (ref 3.5–5.0)
Alkaline Phosphatase: 75 U/L (ref 38–126)
Anion gap: 7 (ref 5–15)
BUN: 25 mg/dL — ABNORMAL HIGH (ref 8–23)
CO2: 26 mmol/L (ref 22–32)
Calcium: 9.2 mg/dL (ref 8.9–10.3)
Chloride: 106 mmol/L (ref 98–111)
Creatinine: 1.25 mg/dL — ABNORMAL HIGH (ref 0.61–1.24)
GFR, Est AFR Am: >60 mL/min (ref >60)
GFR, Estimated: 59 mL/min — ABNORMAL LOW (ref >60)
Glucose, Bld: 91 mg/dL (ref 70–99)
Potassium: 4.1 mmol/L (ref 3.5–5.1)
Sodium: 139 mmol/L (ref 135–145)
Total Bilirubin: 0.3 mg/dL (ref 0.3–1.2)
Total Protein: 6 g/dL — ABNORMAL LOW (ref 6.5–8.1)

## 2019-09-21 MED ORDER — ATROPINE SULFATE 1 MG/ML IJ SOLN
INTRAMUSCULAR | Status: AC
  Filled 2019-09-21: qty 1

## 2019-09-21 MED ORDER — SODIUM CHLORIDE 0.9 % IV SOLN
30.0000 mg/m2 | Freq: Once | INTRAVENOUS | Status: AC
  Administered 2019-09-21: 14:00:00 61 mg via INTRAVENOUS
  Filled 2019-09-21: qty 61

## 2019-09-21 MED ORDER — DEXAMETHASONE SODIUM PHOSPHATE 10 MG/ML IJ SOLN
INTRAMUSCULAR | Status: AC
  Filled 2019-09-21: qty 1

## 2019-09-21 MED ORDER — HEPARIN SOD (PORK) LOCK FLUSH 100 UNIT/ML IV SOLN
500.0000 [IU] | Freq: Once | INTRAVENOUS | Status: AC | PRN
  Administered 2019-09-21: 500 [IU]
  Filled 2019-09-21: qty 5

## 2019-09-21 MED ORDER — SODIUM CHLORIDE 0.9 % IV SOLN
52.0000 mg/m2 | Freq: Once | INTRAVENOUS | Status: AC
  Administered 2019-09-21: 100 mg via INTRAVENOUS
  Filled 2019-09-21: qty 5

## 2019-09-21 MED ORDER — POTASSIUM CHLORIDE 2 MEQ/ML IV SOLN
Freq: Once | INTRAVENOUS | Status: AC
  Filled 2019-09-21: qty 10

## 2019-09-21 MED ORDER — DEXAMETHASONE SODIUM PHOSPHATE 10 MG/ML IJ SOLN
10.0000 mg | Freq: Once | INTRAMUSCULAR | Status: AC
  Administered 2019-09-21: 10 mg via INTRAVENOUS

## 2019-09-21 MED ORDER — PALONOSETRON HCL INJECTION 0.25 MG/5ML
INTRAVENOUS | Status: AC
  Filled 2019-09-21: qty 5

## 2019-09-21 MED ORDER — SODIUM CHLORIDE 0.9% FLUSH
10.0000 mL | INTRAVENOUS | Status: DC | PRN
  Administered 2019-09-21: 10 mL
  Filled 2019-09-21: qty 10

## 2019-09-21 MED ORDER — SODIUM CHLORIDE 0.9 % IV SOLN
Freq: Once | INTRAVENOUS | Status: AC
  Filled 2019-09-21: qty 250

## 2019-09-21 MED ORDER — PALONOSETRON HCL INJECTION 0.25 MG/5ML
0.2500 mg | Freq: Once | INTRAVENOUS | Status: AC
  Administered 2019-09-21: 11:00:00 0.25 mg via INTRAVENOUS

## 2019-09-21 MED ORDER — ATROPINE SULFATE 1 MG/ML IJ SOLN
0.5000 mg | Freq: Once | INTRAMUSCULAR | Status: AC | PRN
  Administered 2019-09-21: 12:00:00 0.5 mg via INTRAVENOUS

## 2019-09-21 MED ORDER — SODIUM CHLORIDE 0.9 % IV SOLN
150.0000 mg | Freq: Once | INTRAVENOUS | Status: AC
  Administered 2019-09-21: 11:00:00 150 mg via INTRAVENOUS
  Filled 2019-09-21: qty 150

## 2019-09-21 NOTE — Patient Instructions (Signed)
Irinotecan injection What is this medicine? IRINOTECAN (ir in oh TEE kan ) is a chemotherapy drug. It is used to treat colon and rectal cancer. This medicine may be used for other purposes; ask your health care provider or pharmacist if you have questions. COMMON BRAND NAME(S): Camptosar What should I tell my health care provider before I take this medicine? They need to know if you have any of these conditions:  dehydration  diarrhea  infection (especially a virus infection such as chickenpox, cold sores, or herpes)  liver disease  low blood counts, like low white cell, platelet, or red cell counts  low levels of calcium, magnesium, or potassium in the blood  recent or ongoing radiation therapy  an unusual or allergic reaction to irinotecan, other medicines, foods, dyes, or preservatives  pregnant or trying to get pregnant  breast-feeding How should I use this medicine? This drug is given as an infusion into a vein. It is administered in a hospital or clinic by a specially trained health care professional. Talk to your pediatrician regarding the use of this medicine in children. Special care may be needed. Overdosage: If you think you have taken too much of this medicine contact a poison control center or emergency room at once. NOTE: This medicine is only for you. Do not share this medicine with others. What if I miss a dose? It is important not to miss your dose. Call your doctor or health care professional if you are unable to keep an appointment. What may interact with this medicine? This medicine may interact with the following medications:  antiviral medicines for HIV or AIDS  certain antibiotics like rifampin or rifabutin  certain medicines for fungal infections like itraconazole, ketoconazole, posaconazole, and voriconazole  certain medicines for seizures like carbamazepine, phenobarbital, phenotoin  clarithromycin  gemfibrozil  nefazodone  [facility]. John's  Wort This list may not describe all possible interactions. Give your health care provider a list of all the medicines, herbs, non-prescription drugs, or dietary supplements you use. Also tell them if you smoke, drink alcohol, or use illegal drugs. Some items may interact with your medicine. What should I watch for while using this medicine? Your condition will be monitored carefully while you are receiving this medicine. You will need important blood work done while you are taking this medicine. This drug may make you feel generally unwell. This is not uncommon, as chemotherapy can affect healthy cells as well as cancer cells. Report any side effects. Continue your course of treatment even though you feel ill unless your doctor tells you to stop. In some cases, you may be given additional medicines to help with side effects. Follow all directions for their use. You may get drowsy or dizzy. Do not drive, use machinery, or do anything that needs mental alertness until you know how this medicine affects you. Do not stand or sit up quickly, especially if you are an older patient. This reduces the risk of dizzy or fainting spells. Call your health care professional for advice if you get a fever, chills, or sore throat, or other symptoms of a cold or flu. Do not treat yourself. This medicine decreases your body's ability to fight infections. Try to avoid being around people who are sick. Avoid taking products that contain aspirin, acetaminophen, ibuprofen, naproxen, or ketoprofen unless instructed by your doctor. These medicines may hide a fever. This medicine may increase your risk to bruise or bleed. Call your doctor or health care professional if you notice  any unusual bleeding. Be careful brushing and flossing your teeth or using a toothpick because you may get an infection or bleed more easily. If you have any dental work done, tell your dentist you are receiving this medicine. Do not become pregnant while  taking this medicine or for 6 months after stopping it. Women should inform their health care professional if they wish to become pregnant or think they might be pregnant. Men should not father a child while taking this medicine and for 3 months after stopping it. There is potential for serious side effects to an unborn child. Talk to your health care professional for more information. Do not breast-feed an infant while taking this medicine or for 7 days after stopping it. This medicine has caused ovarian failure in some women. This medicine may make it more difficult to get pregnant. Talk to your health care professional if you are concerned about your fertility. This medicine has caused decreased sperm counts in some men. This may make it more difficult to father a child. Talk to your health care professional if you are concerned about your fertility. What side effects may I notice from receiving this medicine? Side effects that you should report to your doctor or health care professional as soon as possible:  allergic reactions like skin rash, itching or hives, swelling of the face, lips, or tongue  chest pain  diarrhea  flushing, runny nose, sweating during infusion  low blood counts - this medicine may decrease the number of white blood cells, red blood cells and platelets. You may be at increased risk for infections and bleeding.  nausea, vomiting  pain, swelling, warmth in the leg  signs of decreased platelets or bleeding - bruising, pinpoint red spots on the skin, black, tarry stools, blood in the urine  signs of infection - fever or chills, cough, sore throat, pain or difficulty passing urine  signs of decreased red blood cells - unusually weak or tired, fainting spells, lightheadedness Side effects that usually do not require medical attention (report to your doctor or health care professional if they continue or are bothersome):  constipation  hair loss  headache  loss of  appetite  mouth sores  stomach pain This list may not describe all possible side effects. Call your doctor for medical advice about side effects. You may report side effects to FDA at 1-800-FDA-1088. Where should I keep my medicine? This drug is given in a hospital or clinic and will not be stored at home. NOTE: This sheet is a summary. It may not cover all possible information. If you have questions about this medicine, talk to your doctor, pharmacist, or health care provider.  2020 Elsevier/Gold Standard (2018-09-05 10:09:17) Cisplatin injection What is this medicine? CISPLATIN (SIS pla tin) is a chemotherapy drug. It targets fast dividing cells, like cancer cells, and causes these cells to die. This medicine is used to treat many types of cancer like bladder, ovarian, and testicular cancers. This medicine may be used for other purposes; ask your health care provider or pharmacist if you have questions. COMMON BRAND NAME(S): Platinol, Platinol -AQ What should I tell my health care provider before I take this medicine? They need to know if you have any of these conditions:  eye disease, vision problems  hearing problems  kidney disease  low blood counts, like white cells, platelets, or red blood cells  tingling of the fingers or toes, or other nerve disorder  an unusual or allergic reaction to cisplatin,  carboplatin, oxaliplatin, other medicines, foods, dyes, or preservatives  pregnant or trying to get pregnant  breast-feeding How should I use this medicine? This drug is given as an infusion into a vein. It is administered in a hospital or clinic by a specially trained health care professional. Talk to your pediatrician regarding the use of this medicine in children. Special care may be needed. Overdosage: If you think you have taken too much of this medicine contact a poison control center or emergency room at once. NOTE: This medicine is only for you. Do not share this  medicine with others. What if I miss a dose? It is important not to miss a dose. Call your doctor or health care professional if you are unable to keep an appointment. What may interact with this medicine? This medicine may interact with the following medications:  foscarnet  certain antibiotics like amikacin, gentamicin, neomycin, polymyxin B, streptomycin, tobramycin, vancomycin This list may not describe all possible interactions. Give your health care provider a list of all the medicines, herbs, non-prescription drugs, or dietary supplements you use. Also tell them if you smoke, drink alcohol, or use illegal drugs. Some items may interact with your medicine. What should I watch for while using this medicine? Your condition will be monitored carefully while you are receiving this medicine. You will need important blood work done while you are taking this medicine. This drug may make you feel generally unwell. This is not uncommon, as chemotherapy can affect healthy cells as well as cancer cells. Report any side effects. Continue your course of treatment even though you feel ill unless your doctor tells you to stop. This medicine may increase your risk of getting an infection. Call your healthcare professional for advice if you get a fever, chills, or sore throat, or other symptoms of a cold or flu. Do not treat yourself. Try to avoid being around people who are sick. Avoid taking medicines that contain aspirin, acetaminophen, ibuprofen, naproxen, or ketoprofen unless instructed by your healthcare professional. These medicines may hide a fever. This medicine may increase your risk to bruise or bleed. Call your doctor or health care professional if you notice any unusual bleeding. Be careful brushing and flossing your teeth or using a toothpick because you may get an infection or bleed more easily. If you have any dental work done, tell your dentist you are receiving this medicine. Do not become  pregnant while taking this medicine or for 14 months after stopping it. Women should inform their healthcare professional if they wish to become pregnant or think they might be pregnant. Men should not father a child while taking this medicine and for 11 months after stopping it. There is potential for serious side effects to an unborn child. Talk to your healthcare professional for more information. Do not breast-feed an infant while taking this medicine. This medicine has caused ovarian failure in some women. This medicine may make it more difficult to get pregnant. Talk to your healthcare professional if you are concerned about your fertility. This medicine has caused decreased sperm counts in some men. This may make it more difficult to father a child. Talk to your healthcare professional if you are concerned about your fertility. Drink fluids as directed while you are taking this medicine. This will help protect your kidneys. Call your doctor or health care professional if you get diarrhea. Do not treat yourself. What side effects may I notice from receiving this medicine? Side effects that you should report  to your doctor or health care professional as soon as possible:  allergic reactions like skin rash, itching or hives, swelling of the face, lips, or tongue  blurred vision  changes in vision  decreased hearing or ringing of the ears  nausea, vomiting  pain, redness, or irritation at site where injected  pain, tingling, numbness in the hands or feet  signs and symptoms of bleeding such as bloody or black, tarry stools; red or dark brown urine; spitting up blood or brown material that looks like coffee grounds; red spots on the skin; unusual bruising or bleeding from the eyes, gums, or nose  signs and symptoms of infection like fever; chills; cough; sore throat; pain or trouble passing urine  signs and symptoms of kidney injury like trouble passing urine or change in the amount of  urine  signs and symptoms of low red blood cells or anemia such as unusually weak or tired; feeling faint or lightheaded; falls; breathing problems Side effects that usually do not require medical attention (report to your doctor or health care professional if they continue or are bothersome):  loss of appetite  mouth sores  muscle cramps This list may not describe all possible side effects. Call your doctor for medical advice about side effects. You may report side effects to FDA at 1-800-FDA-1088. Where should I keep my medicine? This drug is given in a hospital or clinic and will not be stored at home. NOTE: This sheet is a summary. It may not cover all possible information. If you have questions about this medicine, talk to your doctor, pharmacist, or health care provider.  2020 Elsevier/Gold Standard (2018-07-11 15:59:17)

## 2019-09-21 NOTE — Patient Instructions (Signed)

## 2019-09-22 ENCOUNTER — Encounter: Payer: Self-pay | Admitting: *Deleted

## 2019-09-22 ENCOUNTER — Encounter: Payer: Self-pay | Admitting: Radiation Oncology

## 2019-09-22 ENCOUNTER — Other Ambulatory Visit: Payer: Self-pay

## 2019-09-22 ENCOUNTER — Ambulatory Visit
Admission: RE | Admit: 2019-09-22 | Discharge: 2019-09-22 | Disposition: A | Discharge status: Home / Self Care | Payer: 59 | Source: Ambulatory Visit | Attending: Radiation Oncology | Admitting: Radiation Oncology

## 2019-09-22 DIAGNOSIS — Z510 Encounter for antineoplastic radiation therapy: Secondary | ICD-10-CM | POA: Diagnosis not present

## 2019-09-22 DIAGNOSIS — Z5111 Encounter for antineoplastic chemotherapy: Secondary | ICD-10-CM | POA: Diagnosis not present

## 2019-09-22 DIAGNOSIS — Z85528 Personal history of other malignant neoplasm of kidney: Secondary | ICD-10-CM | POA: Diagnosis not present

## 2019-09-22 DIAGNOSIS — K5900 Constipation, unspecified: Secondary | ICD-10-CM | POA: Diagnosis not present

## 2019-09-22 DIAGNOSIS — C3490 Malignant neoplasm of unspecified part of unspecified bronchus or lung: Secondary | ICD-10-CM | POA: Diagnosis not present

## 2019-09-22 DIAGNOSIS — C3401 Malignant neoplasm of right main bronchus: Secondary | ICD-10-CM | POA: Diagnosis not present

## 2019-09-22 DIAGNOSIS — Z8546 Personal history of malignant neoplasm of prostate: Secondary | ICD-10-CM | POA: Diagnosis not present

## 2019-09-22 DIAGNOSIS — C7951 Secondary malignant neoplasm of bone: Secondary | ICD-10-CM | POA: Diagnosis not present

## 2019-09-22 DIAGNOSIS — C7931 Secondary malignant neoplasm of brain: Secondary | ICD-10-CM | POA: Diagnosis not present

## 2019-09-22 DIAGNOSIS — C787 Secondary malignant neoplasm of liver and intrahepatic bile duct: Secondary | ICD-10-CM | POA: Diagnosis not present

## 2019-09-24 ENCOUNTER — Ambulatory Visit (INDEPENDENT_AMBULATORY_CARE_PROVIDER_SITE_OTHER): Payer: 59 | Admitting: Otolaryngology

## 2019-09-24 ENCOUNTER — Encounter (INDEPENDENT_AMBULATORY_CARE_PROVIDER_SITE_OTHER): Payer: Self-pay | Admitting: Otolaryngology

## 2019-09-24 ENCOUNTER — Other Ambulatory Visit: Payer: Self-pay

## 2019-09-24 VITALS — Temp 97.9°F

## 2019-09-24 DIAGNOSIS — Z4889 Encounter for other specified surgical aftercare: Secondary | ICD-10-CM
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
## 2019-09-25 DIAGNOSIS — T63451D Toxic effect of venom of hornets, accidental (unintentional), subsequent encounter: Secondary | ICD-10-CM | POA: Diagnosis not present

## 2019-09-25 DIAGNOSIS — T63461D Toxic effect of venom of wasps, accidental (unintentional), subsequent encounter: Secondary | ICD-10-CM | POA: Diagnosis not present

## 2019-09-28 ENCOUNTER — Inpatient Hospital Stay: Payer: 59

## 2019-09-28 ENCOUNTER — Inpatient Hospital Stay: Payer: 59 | Attending: Hematology & Oncology

## 2019-09-28 ENCOUNTER — Other Ambulatory Visit: Payer: Self-pay

## 2019-09-28 VITALS — BP 127/66 | HR 87 | Temp 97.5°F | Resp 17

## 2019-09-28 DIAGNOSIS — Z5111 Encounter for antineoplastic chemotherapy: Secondary | ICD-10-CM | POA: Insufficient documentation

## 2019-09-28 DIAGNOSIS — Z8546 Personal history of malignant neoplasm of prostate: Secondary | ICD-10-CM | POA: Insufficient documentation

## 2019-09-28 DIAGNOSIS — C3491 Malignant neoplasm of unspecified part of right bronchus or lung: Secondary | ICD-10-CM | POA: Diagnosis not present

## 2019-09-28 DIAGNOSIS — Z79899 Other long term (current) drug therapy: Secondary | ICD-10-CM | POA: Insufficient documentation

## 2019-09-28 DIAGNOSIS — Z7952 Long term (current) use of systemic steroids: Secondary | ICD-10-CM | POA: Insufficient documentation

## 2019-09-28 DIAGNOSIS — Z7982 Long term (current) use of aspirin: Secondary | ICD-10-CM | POA: Insufficient documentation

## 2019-09-28 DIAGNOSIS — L03119 Cellulitis of unspecified part of limb: Secondary | ICD-10-CM

## 2019-09-28 DIAGNOSIS — E222 Syndrome of inappropriate secretion of antidiuretic hormone: Secondary | ICD-10-CM | POA: Diagnosis not present

## 2019-09-28 DIAGNOSIS — Z85528 Personal history of other malignant neoplasm of kidney: Secondary | ICD-10-CM | POA: Diagnosis not present

## 2019-09-28 LAB — CBC WITH DIFFERENTIAL (CANCER CENTER ONLY)
Abs Immature Granulocytes: 0.02 10*3/uL (ref 0.00–0.07)
Basophils Absolute: 0 10*3/uL (ref 0.0–0.1)
Basophils Relative: 0 %
Eosinophils Absolute: 0.1 10*3/uL (ref 0.0–0.5)
Eosinophils Relative: 3 %
HCT: 33.2 % — ABNORMAL LOW (ref 39.0–52.0)
Hemoglobin: 11 g/dL — ABNORMAL LOW (ref 13.0–17.0)
Immature Granulocytes: 1 %
Lymphocytes Relative: 16 %
Lymphs Abs: 0.4 10*3/uL — ABNORMAL LOW (ref 0.7–4.0)
MCH: 30.8 pg (ref 26.0–34.0)
MCHC: 33.1 g/dL (ref 30.0–36.0)
MCV: 93 fL (ref 80.0–100.0)
Monocytes Absolute: 0.2 10*3/uL (ref 0.1–1.0)
Monocytes Relative: 11 %
Neutro Abs: 1.5 10*3/uL — ABNORMAL LOW (ref 1.7–7.7)
Neutrophils Relative %: 69 %
Platelet Count: 171 10*3/uL (ref 150–400)
RBC: 3.57 MIL/uL — ABNORMAL LOW (ref 4.22–5.81)
RDW: 13.7 % (ref 11.5–15.5)
WBC Count: 2.2 10*3/uL — ABNORMAL LOW (ref 4.0–10.5)
nRBC: 0 % (ref 0.0–0.2)

## 2019-09-28 LAB — CMP (CANCER CENTER ONLY)
ALT: 15 U/L (ref 0–44)
AST: 11 U/L — ABNORMAL LOW (ref 15–41)
Albumin: 3.7 g/dL (ref 3.5–5.0)
Alkaline Phosphatase: 67 U/L (ref 38–126)
Anion gap: 6 (ref 5–15)
BUN: 24 mg/dL — ABNORMAL HIGH (ref 8–23)
CO2: 26 mmol/L (ref 22–32)
Calcium: 9.8 mg/dL (ref 8.9–10.3)
Chloride: 106 mmol/L (ref 98–111)
Creatinine: 1.18 mg/dL (ref 0.61–1.24)
GFR, Est AFR Am: >60 mL/min (ref >60)
GFR, Estimated: >60 mL/min (ref >60)
Glucose, Bld: 105 mg/dL — ABNORMAL HIGH (ref 70–99)
Potassium: 4.1 mmol/L (ref 3.5–5.1)
Sodium: 138 mmol/L (ref 135–145)
Total Bilirubin: 0.3 mg/dL (ref 0.3–1.2)
Total Protein: 6 g/dL — ABNORMAL LOW (ref 6.5–8.1)

## 2019-09-28 MED ORDER — SODIUM CHLORIDE 0.9% FLUSH
10.0000 mL | INTRAVENOUS | Status: DC | PRN
  Administered 2019-09-28: 10 mL
  Filled 2019-09-28: qty 10

## 2019-09-28 MED ORDER — HEPARIN SOD (PORK) LOCK FLUSH 100 UNIT/ML IV SOLN
500.0000 [IU] | Freq: Once | INTRAVENOUS | Status: AC | PRN
  Administered 2019-09-28: 500 [IU]
  Filled 2019-09-28: qty 5

## 2019-09-28 MED ORDER — SODIUM CHLORIDE 0.9 % IV SOLN
Freq: Once | INTRAVENOUS | Status: AC
  Filled 2019-09-28: qty 250

## 2019-09-28 MED FILL — TEMAZEPAM 30 MG CAPSULE: 30 | 30 days supply | Qty: 30 | Fill #1

## 2019-09-28 NOTE — Patient Instructions (Signed)
Dehydration, Adult Dehydration is a condition in which there is not enough water or other fluids in the body. This happens when a person loses more fluids than he or she takes in. Important organs, such as the kidneys, brain, and heart, cannot function without a proper amount of fluids. Any loss of fluids from the body can lead to dehydration. Dehydration can be mild, moderate, or severe. It should be treated right away to prevent it from becoming severe. What are the causes? Dehydration may be caused by:  Conditions that cause loss of water or other fluids, such as diarrhea, vomiting, or sweating or urinating a lot.  Not drinking enough fluids, especially when you are ill or doing activities that require a lot of energy.  Other illnesses and conditions, such as fever or infection.  Certain medicines, such as medicines that remove excess fluid from the body (diuretics).  Lack of safe drinking water.  Not being able to get enough water and food. What increases the risk? The following factors may make you more likely to develop this condition:  Having a long-term (chronic) illness that has not been treated properly, such as diabetes, heart disease, or kidney disease.  Being 65 years of age or older.  Having a disability.  Living in a place that is high in altitude, where thinner, drier air causes more fluid loss.  Doing exercises that put stress on your body for a long time (endurance sports). What are the signs or symptoms? Symptoms of dehydration depend on how severe it is. Mild or moderate dehydration  Thirst.  Dry lips or dry mouth.  Dizziness or light-headedness, especially when standing up from a seated position.  Muscle cramps.  Dark urine. Urine may be the color of tea.  Less urine or tears produced than usual.  Headache. Severe dehydration  Changes in skin. Your skin may be cold and clammy, blotchy, or pale. Your skin also may not return to normal after being  lightly pinched and released.  Little or no tears, urine, or sweat.  Changes in vital signs, such as rapid breathing and low blood pressure. Your pulse may be weak or may be faster than 100 beats a minute when you are sitting still.  Other changes, such as: ? Feeling very thirsty. ? Sunken eyes. ? Cold hands and feet. ? Confusion. ? Being very tired (lethargic) or having trouble waking from sleep. ? Short-term weight loss. ? Loss of consciousness. How is this diagnosed? This condition is diagnosed based on your symptoms and a physical exam. You may have blood and urine tests to help confirm the diagnosis. How is this treated? Treatment for this condition depends on how severe it is. Treatment should be started right away. Do not wait until dehydration becomes severe. Severe dehydration is an emergency and needs to be treated in a hospital.  Mild or moderate dehydration can be treated at home. You may be asked to: ? Drink more fluids. ? Drink an oral rehydration solution (ORS). This drink helps restore proper amounts of fluids and salts and minerals in the blood (electrolytes).  Severe dehydration can be treated: ? With IV fluids. ? By correcting abnormal levels of electrolytes. This is often done by giving electrolytes through a tube that is passed through your nose and into your stomach (nasogastric tube, or NG tube). ? By treating the underlying cause of dehydration. Follow these instructions at home: Oral rehydration solution If told by your health care provider, drink an ORS:  Make   an ORS by following instructions on the package.  Start by drinking small amounts, about  cup (120 mL) every 5-10 minutes.  Slowly increase how much you drink until you have taken the amount recommended by your health care provider. Eating and drinking         Drink enough clear fluid to keep your urine pale yellow. If you were told to drink an ORS, finish the ORS first and then start slowly  drinking other clear fluids. Drink fluids such as: ? Water. Do not drink only water. Doing that can lead to hyponatremia, which is having too little salt (sodium) in the body. ? Water from ice chips you suck on. ? Fruit juice that you have added water to (diluted fruit juice). ? Low-calorie sports drinks.  Eat foods that contain a healthy balance of electrolytes, such as bananas, oranges, potatoes, tomatoes, and spinach.  Do not drink alcohol.  Avoid the following: ? Drinks that contain a lot of sugar. These include high-calorie sports drinks, fruit juice that is not diluted, and soda. ? Caffeine. ? Foods that are greasy or contain a lot of fat or sugar. General instructions  Take over-the-counter and prescription medicines only as told by your health care provider.  Do not take sodium tablets. Doing that can lead to having too much sodium in the body (hypernatremia).  Return to your normal activities as told by your health care provider. Ask your health care provider what activities are safe for you.  Keep all follow-up visits as told by your health care provider. This is important. Contact a health care provider if:  You have muscle cramps, pain, or discomfort, such as: ? Pain in your abdomen and the pain gets worse or stays in one area (localizes). ? Stiff neck.  You have a rash.  You are more irritable than usual.  You are sleepier or have a harder time waking than usual.  You feel weak or dizzy.  You feel very thirsty. Get help right away if you have:  Any symptoms of severe dehydration.  Symptoms of vomiting, such as: ? You cannot eat or drink without vomiting. ? Vomiting gets worse or does not go away. ? Vomit includes blood or green matter (bile).  Symptoms that get worse with treatment.  A fever.  A severe headache.  Problems with urination or bowel movements, such as: ? Diarrhea that gets worse or does not go away. ? Blood in your stool (feces). This  may cause stool to look black and tarry. ? Not urinating, or urinating only a small amount of very dark urine, within 6-8 hours.  Trouble breathing. These symptoms may represent a serious problem that is an emergency. Do not wait to see if the symptoms will go away. Get medical help right away. Call your local emergency services (911 in the [place] Do not drive yourself to the hospital. Summary  Dehydration is a condition in which there is not enough water or other fluids in the body. This happens when a person loses more fluids than he or she takes in.  Treatment for this condition depends on how severe it is. Treatment should be started right away. Do not wait until dehydration becomes severe.  Drink enough clear fluid to keep your urine pale yellow. If you were told to drink an oral rehydration solution (ORS), finish the ORS first and then start slowly drinking other clear fluids.  Take over-the-counter and prescription medicines only as told by your health care   provider.  Get help right away if you have any symptoms of severe dehydration. This information is not intended to replace advice given to you by your health care provider. Make sure you discuss any questions you have with your health care provider. Document Revised: 02/26/2019 Document Reviewed: 02/26/2019 Elsevier Patient Education  2020 [employer] Inc[employer]   

## 2019-09-28 NOTE — Patient Instructions (Signed)
Implanted [facility] Guide An implanted port is a device that is placed under the skin. It is usually placed in the chest. The device can be used to give IV medicine, to take blood, or for dialysis. You may have an implanted port if:  You need IV medicine that would be irritating to the small veins in your hands or arms.  You need IV medicines, such as antibiotics, for a long period of time.  You need IV nutrition for a long period of time.  You need dialysis. Having a port means that your health care provider will not need to use the veins in your arms for these procedures. You may have fewer limitations when using a port than you would if you used other types of long-term IVs, and you will likely be able to return to normal activities after your incision heals. An implanted port has two main parts:  Reservoir. The reservoir is the part where a needle is inserted to give medicines or draw blood. The reservoir is round. After it is placed, it appears as a small, raised area under your skin.  Catheter. The catheter is a thin, flexible tube that connects the reservoir to a vein. Medicine that is inserted into the reservoir goes into the catheter and then into the vein. How is my port accessed? To access your port:  A numbing cream may be placed on the skin over the port site.  Your health care provider will put on a mask and sterile gloves.  The skin over your port will be cleaned carefully with a germ-killing soap and allowed to dry.  Your health care provider will gently pinch the port and insert a needle into it.  Your health care provider will check for a blood return to make sure the port is in the vein and is not clogged.  If your port needs to remain accessed to get medicine continuously (constant infusion), your health care provider will place a clear bandage (dressing) over the needle site. The dressing and needle will need to be changed every week, or as told by your health care  provider. What is flushing? Flushing helps keep the port from getting clogged. Follow instructions from your health care provider about how and when to flush the port. Ports are usually flushed with saline solution or a medicine called heparin. The need for flushing will depend on how the port is used:  If the port is only used from time to time to give medicines or draw blood, the port may need to be flushed: ? Before and after medicines have been given. ? Before and after blood has been drawn. ? As part of routine maintenance. Flushing may be recommended every 4-6 weeks.  If a constant infusion is running, the port may not need to be flushed.  Throw away any syringes in a disposal container that is meant for sharp items (sharps container). You can buy a sharps container from a pharmacy, or you can make one by using an empty hard plastic bottle with a cover. How long will my port stay implanted? The port can stay in for as long as your health care provider thinks it is needed. When it is time for the port to come out, a surgery will be done to remove it. The surgery will be similar to the procedure that was done to put the port in. Follow these instructions at home:   Flush your port as told by your health care provider.  If you need an infusion over several days, follow instructions from your health care provider about how to take care of your port site. Make sure you: ? Wash your hands with soap and water before you change your dressing. If soap and water are not available, use alcohol-based hand sanitizer. ? Change your dressing as told by your health care provider. ? Place any used dressings or infusion bags into a plastic bag. Throw that bag in the trash. ? Keep the dressing that covers the needle clean and dry. Do not get it wet. ? Do not use scissors or sharp objects near the tube. ? Keep the tube clamped, unless it is being used.  Check your port site every day for signs of  infection. Check for: ? Redness, swelling, or pain. ? Fluid or blood. ? Pus or a bad smell.  Protect the skin around the port site. ? Avoid wearing bra straps that rub or irritate the site. ? Protect the skin around your port from seat belts. Place a soft pad over your chest if needed.  Bathe or shower as told by your health care provider. The site may get wet as long as you are not actively receiving an infusion.  Return to your normal activities as told by your health care provider. Ask your health care provider what activities are safe for you.  Carry a medical alert card or wear a medical alert bracelet at all times. This will let health care providers know that you have an implanted port in case of an emergency. Get help right away if:  You have redness, swelling, or pain at the port site.  You have fluid or blood coming from your port site.  You have pus or a bad smell coming from the port site.  You have a fever. Summary  Implanted ports are usually placed in the chest for long-term IV access.  Follow instructions from your health care provider about flushing the port and changing bandages (dressings).  Take care of the area around your port by avoiding clothing that puts pressure on the area, and by watching for signs of infection.  Protect the skin around your port from seat belts. Place a soft pad over your chest if needed.  Get help right away if you have a fever or you have redness, swelling, pain, drainage, or a bad smell at the port site. This information is not intended to replace advice given to you by your health care provider. Make sure you discuss any questions you have with your health care provider. Document Revised: 11/07/2018 Document Reviewed: 08/18/2016 Elsevier Patient Education  2020 [employer].

## 2019-09-30 ENCOUNTER — Ambulatory Visit: Payer: 59 | Attending: Internal Medicine

## 2019-09-30 DIAGNOSIS — Z23 Encounter for immunization: Secondary | ICD-10-CM | POA: Insufficient documentation

## 2019-09-30 NOTE — Progress Notes (Signed)
   Covid-19 Vaccination Clinic  Name:  Taylor Elliott    MRN: [id number] DOB: 03-May-1951  09/30/2019  [name] was observed post Covid-19 immunization for 30 minutes based on pre-vaccination screening without incident. He was provided with Vaccine Information Sheet and instruction to access the V-Safe system.   [name] was instructed to call 911 with any severe reactions post vaccine: Marland Kitchen Difficulty breathing  . Swelling of face and throat  . A fast heartbeat  . A bad rash all over body  . Dizziness and weakness   Immunizations Administered    Name Date Dose VIS Date Route   Pfizer COVID-19 Vaccine 09/30/2019  9:04 AM 0.3 mL 07/10/2019 Intramuscular   Manufacturer: [place]   Lot: [id number]   [place]: [id number]

## 2019-10-01 ENCOUNTER — Other Ambulatory Visit: Payer: 59

## 2019-10-01 ENCOUNTER — Ambulatory Visit: Payer: 59

## 2019-10-01 ENCOUNTER — Ambulatory Visit: Payer: 59 | Admitting: Hematology & Oncology

## 2019-10-02 IMAGING — US US EXTREM LOW VENOUS
1 series · 13 of 24 positions shown · non-contrast
Comparison: None.

CLINICAL DATA: 67-year-old male with a history of lower extremity
swelling



[Series 1: us extrem low venous · 0.09mm/px · 13 of 65 slices shown]
[im 1/65]
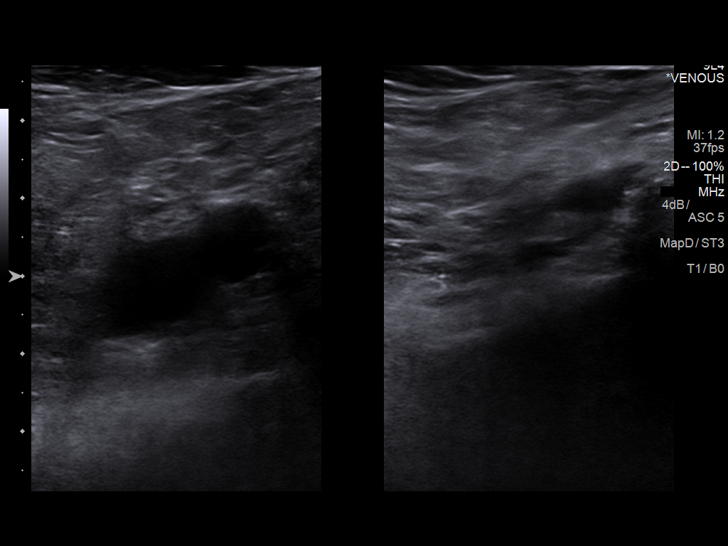
[im 6/65]
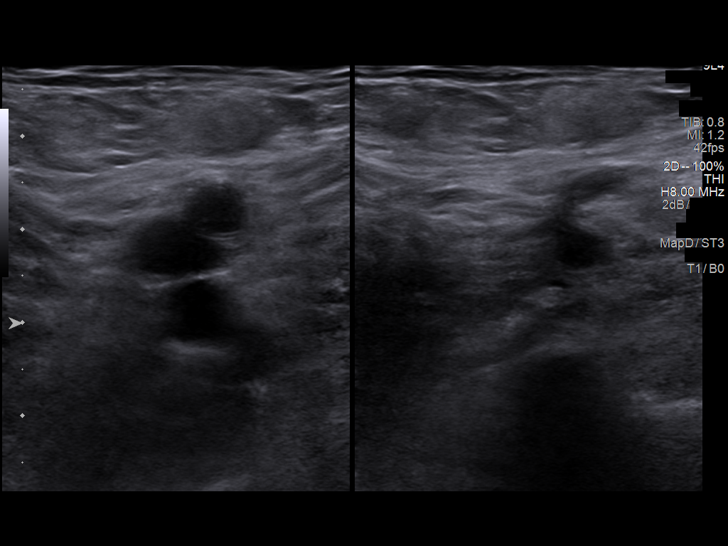
[im 12/65]
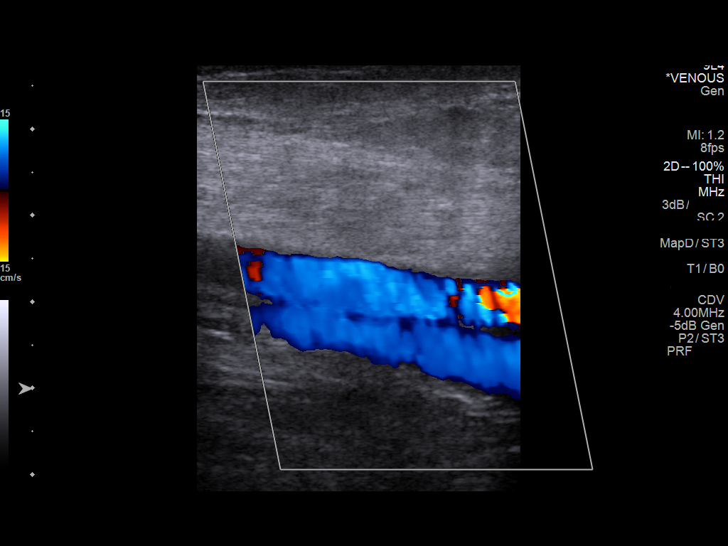
[im 17/65]
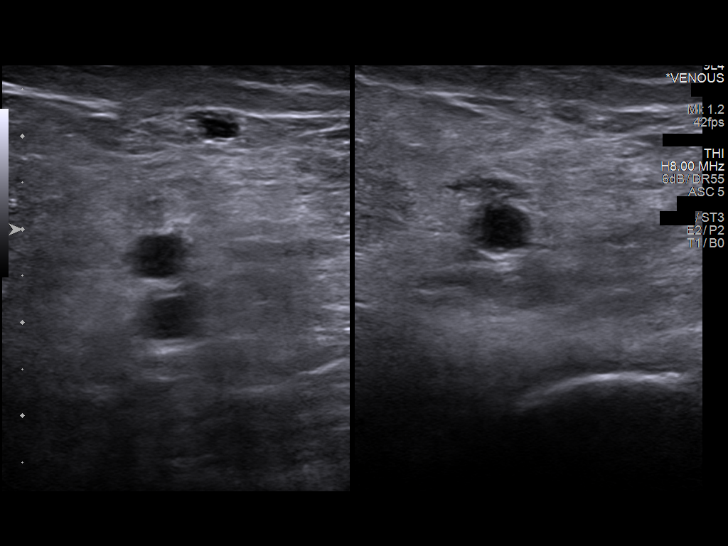
[im 23/65]
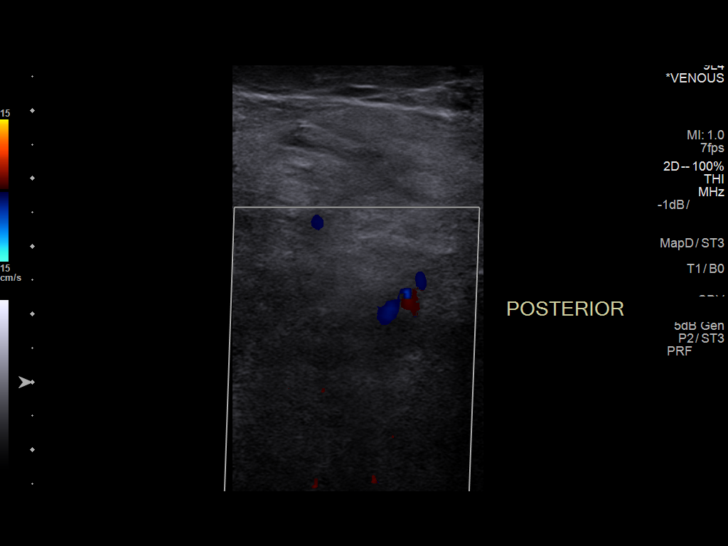
[im 28/65]
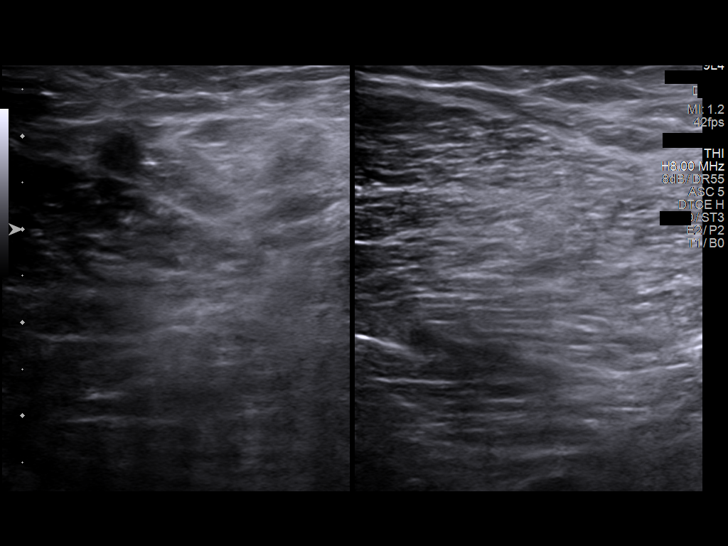
[im 34/65]
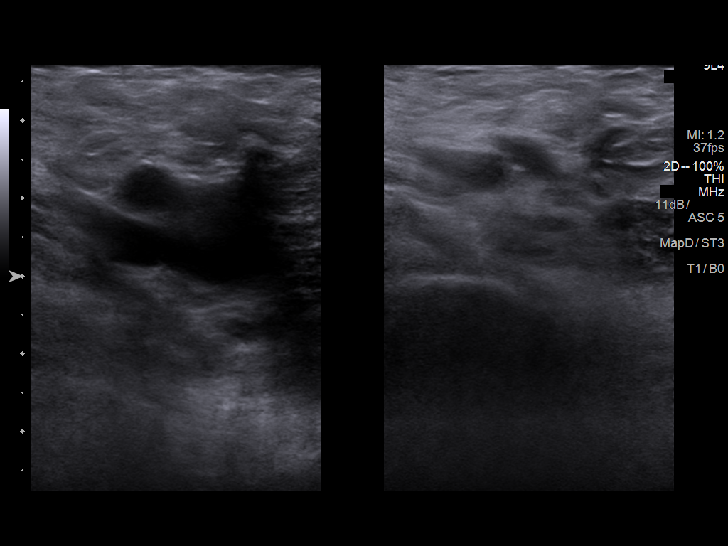
[im 37/65]
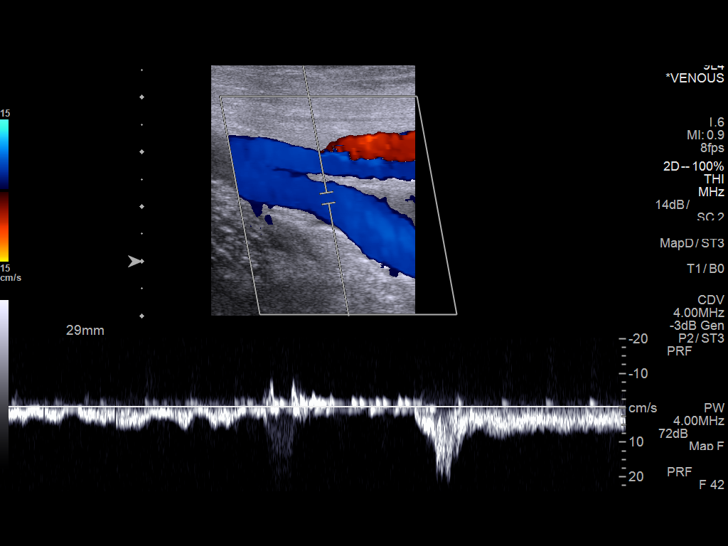
[im 42/65]
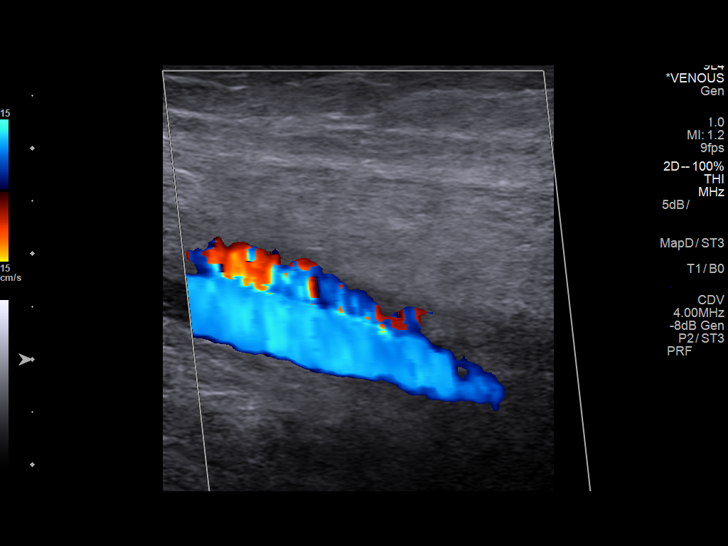
[im 48/65]
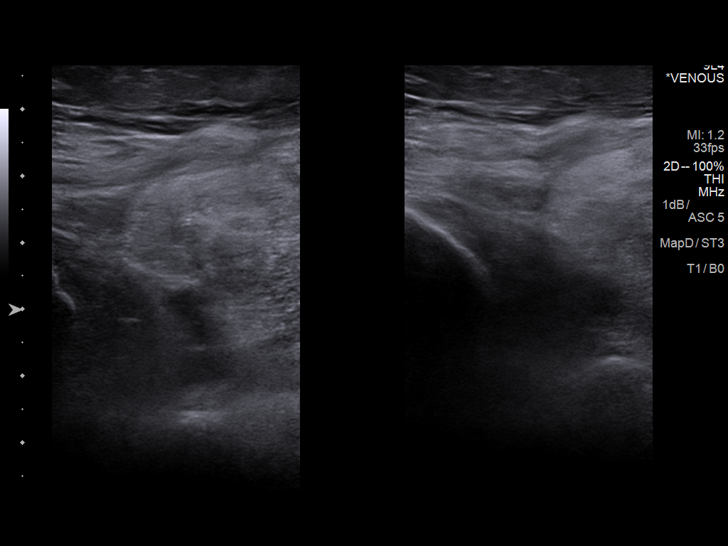
[im 53/65]
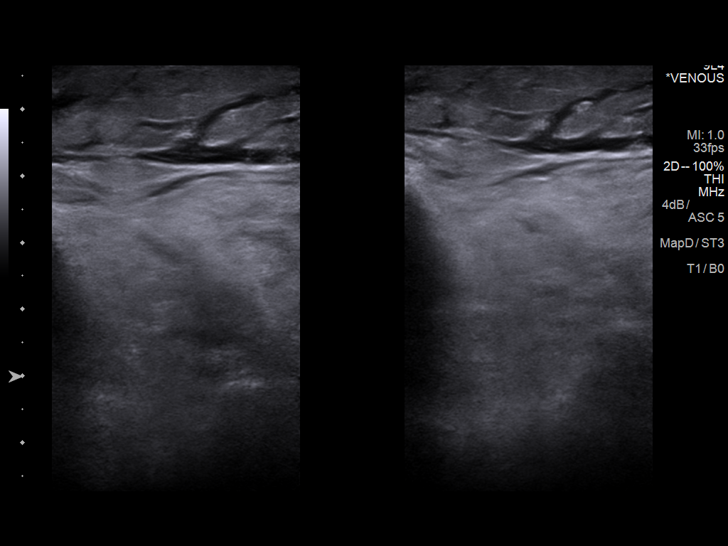
[im 59/65]
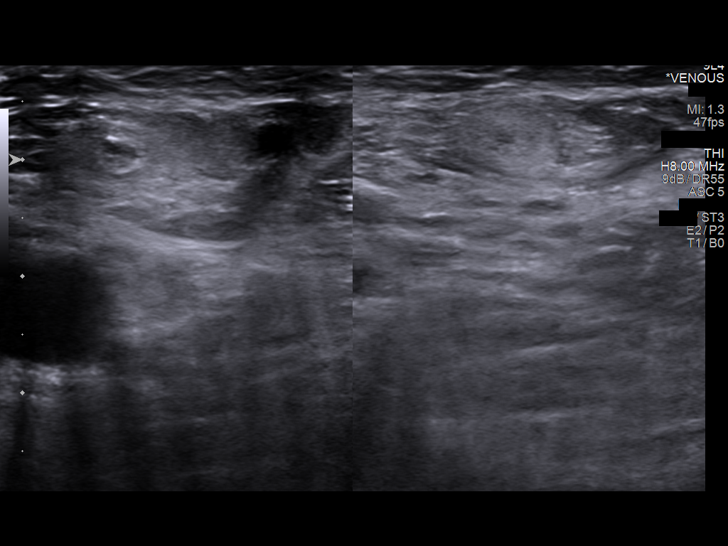
[im 65/65]
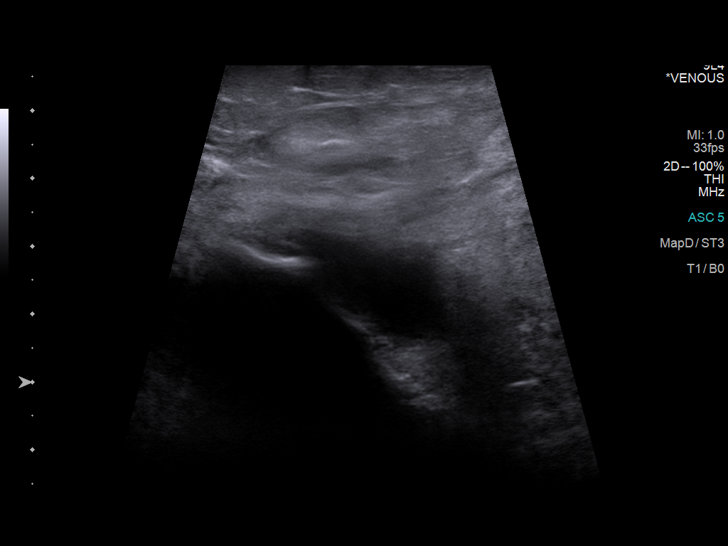

[13 of 24 positions shown; findings below may reference images not displayed]

FINDINGS: RIGHT LOWER EXTREMITY

Common Femoral Vein: No evidence of thrombus. Normal
compressibility, respiratory phasicity and response to augmentation.

Saphenofemoral Junction: No evidence of thrombus. Normal
compressibility and flow on color Doppler imaging.

Profunda Femoral Vein: No evidence of thrombus. Normal
compressibility and flow on color Doppler imaging.

Femoral Vein: No evidence of thrombus. Normal compressibility,
respiratory phasicity and response to augmentation.

Popliteal Vein: No evidence of thrombus. Normal compressibility,
respiratory phasicity and response to augmentation.

Calf Veins: No evidence of thrombus. Normal compressibility and flow
on color Doppler imaging.

Superficial Great Saphenous Vein: No evidence of thrombus. Normal
compressibility and flow on color Doppler imaging.

Other Findings: Edema. Questionable parameniscal cyst or Baker's
cyst at the knee.

LEFT LOWER EXTREMITY

Common Femoral Vein: No evidence of thrombus. Normal
compressibility, respiratory phasicity and response to augmentation.

Saphenofemoral Junction: No evidence of thrombus. Normal
compressibility and flow on color Doppler imaging.

Profunda Femoral Vein: No evidence of thrombus. Normal
compressibility and flow on color Doppler imaging.

Femoral Vein: No evidence of thrombus. Normal compressibility,
respiratory phasicity and response to augmentation.

Popliteal Vein: No evidence of thrombus. Normal compressibility,
respiratory phasicity and response to augmentation.

Calf Veins: No evidence of thrombus. Normal compressibility and flow
on color Doppler imaging.

Superficial Great Saphenous Vein: No evidence of thrombus. Normal
compressibility and flow on color Doppler imaging.

Other Findings:  Edema
IMPRESSION: Sonographic survey of the bilateral lower extremities negative for
DVT.

Edema of the bilateral lower extremities

Questionable right-sided parameniscal cyst or Baker's cyst.

## 2019-10-05 ENCOUNTER — Other Ambulatory Visit: Payer: Self-pay

## 2019-10-05 ENCOUNTER — Encounter: Payer: Self-pay | Admitting: *Deleted

## 2019-10-05 ENCOUNTER — Inpatient Hospital Stay: Payer: 59

## 2019-10-05 ENCOUNTER — Inpatient Hospital Stay (HOSPITAL_BASED_OUTPATIENT_CLINIC_OR_DEPARTMENT_OTHER): Payer: 59 | Admitting: Hematology & Oncology

## 2019-10-05 ENCOUNTER — Encounter: Payer: Self-pay | Admitting: Hematology & Oncology

## 2019-10-05 VITALS — BP 129/65 | HR 80 | Temp 97.0°F | Resp 16

## 2019-10-05 DIAGNOSIS — Z7982 Long term (current) use of aspirin: Secondary | ICD-10-CM | POA: Diagnosis not present

## 2019-10-05 DIAGNOSIS — Z8546 Personal history of malignant neoplasm of prostate: Secondary | ICD-10-CM | POA: Diagnosis not present

## 2019-10-05 DIAGNOSIS — E222 Syndrome of inappropriate secretion of antidiuretic hormone: Secondary | ICD-10-CM | POA: Diagnosis not present

## 2019-10-05 DIAGNOSIS — Z7952 Long term (current) use of systemic steroids: Secondary | ICD-10-CM | POA: Diagnosis not present

## 2019-10-05 DIAGNOSIS — R05 Cough: Secondary | ICD-10-CM | POA: Diagnosis not present

## 2019-10-05 DIAGNOSIS — C3491 Malignant neoplasm of unspecified part of right bronchus or lung: Secondary | ICD-10-CM

## 2019-10-05 DIAGNOSIS — Z5111 Encounter for antineoplastic chemotherapy: Secondary | ICD-10-CM | POA: Diagnosis not present

## 2019-10-05 DIAGNOSIS — Z85528 Personal history of other malignant neoplasm of kidney: Secondary | ICD-10-CM | POA: Diagnosis not present

## 2019-10-05 DIAGNOSIS — R059 Cough, unspecified: Secondary | ICD-10-CM

## 2019-10-05 DIAGNOSIS — L03119 Cellulitis of unspecified part of limb: Secondary | ICD-10-CM | POA: Diagnosis not present

## 2019-10-05 DIAGNOSIS — Z79899 Other long term (current) drug therapy: Secondary | ICD-10-CM | POA: Diagnosis not present

## 2019-10-05 LAB — CBC WITH DIFFERENTIAL (CANCER CENTER ONLY)
Abs Immature Granulocytes: 0 10*3/uL (ref 0.00–0.07)
Basophils Absolute: 0 10*3/uL (ref 0.0–0.1)
Basophils Relative: 1 %
Eosinophils Absolute: 0.1 10*3/uL (ref 0.0–0.5)
Eosinophils Relative: 3 %
HCT: 32 % — ABNORMAL LOW (ref 39.0–52.0)
Hemoglobin: 10.7 g/dL — ABNORMAL LOW (ref 13.0–17.0)
Immature Granulocytes: 0 %
Lymphocytes Relative: 15 %
Lymphs Abs: 0.3 10*3/uL — ABNORMAL LOW (ref 0.7–4.0)
MCH: 30.9 pg (ref 26.0–34.0)
MCHC: 33.4 g/dL (ref 30.0–36.0)
MCV: 92.5 fL (ref 80.0–100.0)
Monocytes Absolute: 0.3 10*3/uL (ref 0.1–1.0)
Monocytes Relative: 16 %
Neutro Abs: 1.4 10*3/uL — ABNORMAL LOW (ref 1.7–7.7)
Neutrophils Relative %: 65 %
Platelet Count: 142 10*3/uL — ABNORMAL LOW (ref 150–400)
RBC: 3.46 MIL/uL — ABNORMAL LOW (ref 4.22–5.81)
RDW: 14.4 % (ref 11.5–15.5)
WBC Count: 2.1 10*3/uL — ABNORMAL LOW (ref 4.0–10.5)
nRBC: 0 % (ref 0.0–0.2)

## 2019-10-05 LAB — CMP (CANCER CENTER ONLY)
ALT: 16 U/L (ref 0–44)
AST: 15 U/L (ref 15–41)
Albumin: 3.7 g/dL (ref 3.5–5.0)
Alkaline Phosphatase: 81 U/L (ref 38–126)
Anion gap: 6 (ref 5–15)
BUN: 18 mg/dL (ref 8–23)
CO2: 26 mmol/L (ref 22–32)
Calcium: 9.6 mg/dL (ref 8.9–10.3)
Chloride: 103 mmol/L (ref 98–111)
Creatinine: 1.13 mg/dL (ref 0.61–1.24)
GFR, Est AFR Am: >60 mL/min (ref >60)
GFR, Estimated: >60 mL/min (ref >60)
Glucose, Bld: 96 mg/dL (ref 70–99)
Potassium: 4.3 mmol/L (ref 3.5–5.1)
Sodium: 135 mmol/L (ref 135–145)
Total Bilirubin: 0.3 mg/dL (ref 0.3–1.2)
Total Protein: 5.9 g/dL — ABNORMAL LOW (ref 6.5–8.1)

## 2019-10-05 MED ORDER — SODIUM CHLORIDE 0.9 % IV SOLN
30.0000 mg/m2 | Freq: Once | INTRAVENOUS | Status: AC
  Administered 2019-10-05: 61 mg via INTRAVENOUS
  Filled 2019-10-05: qty 61

## 2019-10-05 MED ORDER — SODIUM CHLORIDE 0.9 % IV SOLN
52.0000 mg/m2 | Freq: Once | INTRAVENOUS | Status: AC
  Administered 2019-10-05: 100 mg via INTRAVENOUS
  Filled 2019-10-05: qty 5

## 2019-10-05 MED ORDER — POTASSIUM CHLORIDE 2 MEQ/ML IV SOLN
Freq: Once | INTRAVENOUS | Status: AC
  Filled 2019-10-05: qty 10

## 2019-10-05 MED ORDER — PALONOSETRON HCL INJECTION 0.25 MG/5ML
0.2500 mg | Freq: Once | INTRAVENOUS | Status: AC
  Administered 2019-10-05: 0.25 mg via INTRAVENOUS

## 2019-10-05 MED ORDER — PALONOSETRON HCL INJECTION 0.25 MG/5ML
INTRAVENOUS | Status: AC
  Filled 2019-10-05: qty 5

## 2019-10-05 MED ORDER — SODIUM CHLORIDE 0.9 % IV SOLN
150.0000 mg | Freq: Once | INTRAVENOUS | Status: AC
  Administered 2019-10-05: 150 mg via INTRAVENOUS
  Filled 2019-10-05: qty 150

## 2019-10-05 MED ORDER — HEPARIN SOD (PORK) LOCK FLUSH 100 UNIT/ML IV SOLN
500.0000 [IU] | Freq: Once | INTRAVENOUS | Status: AC | PRN
  Administered 2019-10-05: 500 [IU]
  Filled 2019-10-05: qty 5

## 2019-10-05 MED ORDER — DEXAMETHASONE SODIUM PHOSPHATE 10 MG/ML IJ SOLN
10.0000 mg | Freq: Once | INTRAMUSCULAR | Status: AC
  Administered 2019-10-05: 10 mg via INTRAVENOUS

## 2019-10-05 MED ORDER — DEXAMETHASONE SODIUM PHOSPHATE 10 MG/ML IJ SOLN
INTRAMUSCULAR | Status: AC
  Filled 2019-10-05: qty 1

## 2019-10-05 MED ORDER — SODIUM CHLORIDE 0.9% FLUSH
10.0000 mL | INTRAVENOUS | Status: DC | PRN
  Administered 2019-10-05: 10 mL
  Filled 2019-10-05: qty 10

## 2019-10-05 MED ORDER — HYDROCODONE-HOMATROPINE 5-1.5 MG/5ML PO SYRP: 5.0000 mL | ORAL_SOLUTION | ORAL | 0 refills | Status: DC | PRN

## 2019-10-05 MED ORDER — SODIUM CHLORIDE 0.9 % IV SOLN
Freq: Once | INTRAVENOUS | Status: AC
  Filled 2019-10-05: qty 250

## 2019-10-05 MED FILL — HYDROCODONE-HOMATROPINE SYR: 5-1.5 | 10 days supply | Qty: 300 | Fill #0

## 2019-10-05 NOTE — Progress Notes (Signed)
   1000 250 cc urine out put this morning. Reviewed all labs with Dr Marin Olp.  Ok to treat today with WBC 2.1 and ANC 1.4.

## 2019-10-05 NOTE — Patient Instructions (Signed)
[facility] Discharge Instructions for Patients Receiving Chemotherapy  Today you received the following chemotherapy agents Cisplatin, Irinotecan  To help prevent nausea and vomiting after your treatment, we encourage you to take your nausea medication    If you develop nausea and vomiting that is not controlled by your nausea medication, call the clinic.   BELOW ARE SYMPTOMS THAT SHOULD BE REPORTED IMMEDIATELY:  *FEVER GREATER THAN 100.5 F  *CHILLS WITH OR WITHOUT FEVER  NAUSEA AND VOMITING THAT IS NOT CONTROLLED WITH YOUR NAUSEA MEDICATION  *UNUSUAL SHORTNESS OF BREATH  *UNUSUAL BRUISING OR BLEEDING  TENDERNESS IN MOUTH AND THROAT WITH OR WITHOUT PRESENCE OF ULCERS  *URINARY PROBLEMS  *BOWEL PROBLEMS  UNUSUAL RASH Items with * indicate a potential emergency and should be followed up as soon as possible.  Feel free to call the clinic should you have any questions or concerns. The clinic phone number is [phone].  Please show the [facility] at check-in to the Emergency Department and triage nurse.

## 2019-10-05 NOTE — Patient Instructions (Signed)
Implanted Port Insertion, Care After °This sheet gives you information about how to care for yourself after your procedure. Your health care provider may also give you more specific instructions. If you have problems or questions, contact your health care provider. °What can I expect after the procedure? °After the procedure, it is common to have: °· Discomfort at the port insertion site. °· Bruising on the skin over the port. This should improve over 3-4 days. °Follow these instructions at home: °Port care °· After your port is placed, you will get a manufacturer's information card. The card has information about your port. Keep this card with you at all times. °· Take care of the port as told by your health care provider. Ask your health care provider if you or a family member can get training for taking care of the port at home. A home health care nurse may also take care of the port. °· Make sure to remember what type of port you have. °Incision care ° °  ° °· Follow instructions from your health care provider about how to take care of your port insertion site. Make sure you: °? Wash your hands with soap and water before and after you change your bandage (dressing). If soap and water are not available, use hand sanitizer. °? Change your dressing as told by your health care provider. °? Leave stitches (sutures), skin glue, or adhesive strips in place. These skin closures may need to stay in place for 2 weeks or longer. If adhesive strip edges start to loosen and curl up, you may trim the loose edges. Do not remove adhesive strips completely unless your health care provider tells you to do that. °· Check your port insertion site every day for signs of infection. Check for: °? Redness, swelling, or pain. °? Fluid or blood. °? Warmth. °? Pus or a bad smell. °Activity °· Return to your normal activities as told by your health care provider. Ask your health care provider what activities are safe for you. °· Do not  lift anything that is heavier than 10 lb (4.5 kg), or the limit that you are told, until your health care provider says that it is safe. °General instructions °· Take over-the-counter and prescription medicines only as told by your health care provider. °· Do not take baths, swim, or use a hot tub until your health care provider approves. Ask your health care provider if you may take showers. You may only be allowed to take sponge baths. °· Do not drive for 24 hours if you were given a sedative during your procedure. °· Wear a medical alert bracelet in case of an emergency. This will tell any health care providers that you have a port. °· Keep all follow-up visits as told by your health care provider. This is important. °Contact a health care provider if: °· You cannot flush your port with saline as directed, or you cannot draw blood from the port. °· You have a fever or chills. °· You have redness, swelling, or pain around your port insertion site. °· You have fluid or blood coming from your port insertion site. °· Your port insertion site feels warm to the touch. °· You have pus or a bad smell coming from the port insertion site. °Get help right away if: °· You have chest pain or shortness of breath. °· You have bleeding from your port that you cannot control. °Summary °· Take care of the port as told by your health   care provider. Keep the manufacturer's information card with you at all times. °· Change your dressing as told by your health care provider. °· Contact a health care provider if you have a fever or chills or if you have redness, swelling, or pain around your port insertion site. °· Keep all follow-up visits as told by your health care provider. °This information is not intended to replace advice given to you by your health care provider. Make sure you discuss any questions you have with your health care provider. °Document Revised: 02/11/2018 Document Reviewed: 02/11/2018 °Elsevier Patient Education ©  2020 Elsevier Inc. ° °

## 2019-10-05 NOTE — Progress Notes (Signed)
Hematology and Oncology Follow Up Visit  Taylor Elliott [id number] [birth year] 69 y.o. 10/05/2019   Principle Diagnosis:   Extensive stage small cell lung cancer  SIADH secondary to small cell lung cancer  Current Therapy:           Carboplatinum/etoposide/Tecentriq-cycle #6  Atezolizumab 1200 mg IV q 3 week -- maintenance -  Start 01/21/2019  XRT for CNS mets -- completed on 03/27/2019  Lurbinectedin -- s/p cycle #4-- started on 05/07/2019 -- d/c on 09/03/2019 due to progression  CDDP/Irinotecan -- s/p cycle #1- started on 09/11/2019     Interim History:  [name] is back for follow-up.  He has for cycle of chemotherapy and did pretty well with it.  We are decreasing his dose of demeclocycline for the SIADH.  Hopefully, the chemotherapy is shrinking his recurrent small cell lung cancer and the SIADH is improving.  [name] looks pretty good.  He feels okay.  He really has had no problems with diarrhea.  He has had a little bit of constipation.  He has had no bleeding.  He has had no bruising.  He has had no headache.  There is been no problems with mouth sores.  He has had no problems with fever.  His cough seems to be under fairly good control.  Overall, I would say his performance status is ECOG 1.    Medications:  Current Outpatient Medications:  .  amLODipine (NORVASC) 10 MG tablet, Take 10 mg by mouth daily., Disp: , Rfl:  .  amoxicillin (AMOXIL) 875 MG tablet, Take 1 tablet (875 mg total) by mouth 2 (two) times daily., Disp: 20 tablet, Rfl: 0 .  aspirin EC 81 MG tablet, Take 81 mg by mouth daily., Disp: , Rfl:  .  calcium carbonate (TUMS - DOSED IN MG ELEMENTAL CALCIUM) 500 MG chewable tablet, Chew 2 tablets by mouth daily as needed for indigestion or heartburn. , Disp: , Rfl:  .  COMBIVENT RESPIMAT 20-100 MCG/ACT AERS respimat, 2 (two) times daily. , Disp: , Rfl:  .  dexamethasone (DECADRON) 4 MG tablet, Take 2 tablets once a day on the day after chemotherapy and  then take 2 tablets two times a day for 2 days. Take with food., Disp: 30 tablet, Rfl: 1 .  dronabinol (MARINOL) 5 MG capsule, Take 1 capsule (5 mg total) by mouth 2 (two) times daily before a meal., Disp: 60 capsule, Rfl: 1 .  EPINEPHrine 0.3 mg/0.3 mL IJ SOAJ injection, Inject 0.3 mg into the muscle once., Disp: , Rfl:  .  fenofibrate 160 MG tablet, TAKE 1 TABLET (160 MG TOTAL) BY MOUTH DAILY., Disp: 90 tablet, Rfl: 1 .  HYDROcodone-homatropine (HYCODAN) 5-1.5 MG/5ML syrup, Take 5 mLs by mouth every 4 (four) hours as needed for cough., Disp: 300 mL, Rfl: 0 .  lactulose (CHRONULAC) 10 GM/15ML solution, Take 15 mLs (10 g total) by mouth 2 (two) times daily as needed for mild constipation., Disp: 473 mL, Rfl: 1 .  lidocaine-prilocaine (EMLA) cream, Apply to affected area once, Disp: 30 g, Rfl: 3 .  loperamide (IMODIUM A-D) 2 MG tablet, Take 2 at diarrhea onset, then 1 every 2hr until 12hrs with no BM. May take 2 every 4hrs at night. If diarrhea recurs repeat., Disp: 100 tablet, Rfl: 1 .  LORazepam (ATIVAN) 0.5 MG tablet, Take 1 tablet (0.5 mg total) by mouth every 6 (six) hours as needed (Nausea or vomiting)., Disp: 30 tablet, Rfl: 0 .  LORazepam (ATIVAN) 1 MG  tablet, TAKE 1 TABLET (1 MG TOTAL) BY MOUTH EVERY 6 (SIX) HOURS AS NEEDED FOR ANXIETY., Disp: 60 tablet, Rfl: 0 .  Multiple Vitamin (MULTIVITAMIN) tablet, Take 1 tablet by mouth daily., Disp: , Rfl:  .  ondansetron (ZOFRAN) 8 MG tablet, Take 1 tablet (8 mg total) by mouth 2 (two) times daily as needed. Start on the third day after chemotherapy., Disp: 30 tablet, Rfl: 1 .  prochlorperazine (COMPAZINE) 10 MG tablet, Take 1 tablet (10 mg total) by mouth every 6 (six) hours as needed (Nausea or vomiting)., Disp: 30 tablet, Rfl: 1 .  pyridoxine (B-6) 200 MG tablet, Take 200 mg by mouth daily., Disp: , Rfl:  .  solifenacin (VESICARE) 10 MG tablet, Take 10 mg by mouth every evening., Disp: , Rfl:  .  temazepam (RESTORIL) 30 MG capsule, Take 1 capsule  (30 mg total) by mouth at bedtime as needed for sleep., Disp: 30 capsule, Rfl: 2 .  UNABLE TO FIND, Med Name: Allergy shots., Disp: , Rfl:  No current facility-administered medications for this visit.  Facility-Administered Medications Ordered in Other Visits:  .  heparin lock flush 100 unit/mL, 500 Units, Intracatheter, Once PRN, Yazmina Pareja, Rudell Cobb, MD .  irinotecan (CAMPTOSAR) 100 mg in sodium chloride 0.9 % 500 mL chemo infusion, 52 mg/m2 (Treatment Plan Recorded), Intravenous, Once, Daesha Insco R, MD .  sodium chloride flush (NS) 0.9 % injection 10 mL, 10 mL, Intracatheter, PRN, Volanda Napoleon, MD  Allergies:  Allergies  Allergen Reactions  . Bee Venom Anaphylaxis  . Clindamycin/Lincomycin Hives    Past Medical History, Surgical history, Social history, and Family History were reviewed and updated.  Review of Systems: Review of Systems  Constitutional: Negative.   HENT:  Negative.   Eyes: Negative.   Respiratory: Positive for cough.   Cardiovascular: Negative.   Gastrointestinal: Negative.   Endocrine: Negative.   Genitourinary: Negative.    Musculoskeletal: Positive for back pain, myalgias and neck pain.  Skin: Positive for itching and rash.  Neurological: Negative.   Hematological: Negative.   Psychiatric/Behavioral: Negative.     Physical Exam:  vitals were not taken for this visit.   Wt Readings from Last 3 Encounters:  09/04/19 191 lb (86.6 kg)  09/03/19 191 lb 6.4 oz (86.8 kg)  08/20/19 187 lb (84.8 kg)    Physical Exam Vitals reviewed.  HENT:     Head: Normocephalic and atraumatic.  Eyes:     Pupils: Pupils are equal, round, and reactive to light.  Cardiovascular:     Rate and Rhythm: Normal rate and regular rhythm.     Heart sounds: Normal heart sounds.  Pulmonary:     Effort: Pulmonary effort is normal.     Breath sounds: Normal breath sounds.  Abdominal:     General: Bowel sounds are normal.     Palpations: Abdomen is soft.     Comments:  Abdominal exam shows a slightly obese abdomen.  He does have an umbilical hernia.  I really cannot palpate his liver at this point.  There is no fluid wave.  There is no inguinal adenopathy.  There is no splenomegaly.    Musculoskeletal:        General: No tenderness or deformity. Normal range of motion.     Cervical back: Normal range of motion.  Lymphadenopathy:     Cervical: No cervical adenopathy.  Skin:    General: Skin is warm and dry.     Findings: No erythema or rash.  Neurological:  Mental Status: He is alert and oriented to person, place, and time.  Psychiatric:        Behavior: Behavior normal.        Thought Content: Thought content normal.        Judgment: Judgment normal.      Lab Results  Component Value Date   WBC 2.1 (L) 10/05/2019   HGB 10.7 (L) 10/05/2019   HCT 32.0 (L) 10/05/2019   MCV 92.5 10/05/2019   PLT 142 (L) 10/05/2019     Chemistry      Component Value Date/Time   NA 135 10/05/2019 0815   NA 135 (L) 07/12/2015 0813   K 4.3 10/05/2019 0815   K 4.5 07/12/2015 0813   CL 103 10/05/2019 0815   CO2 26 10/05/2019 0815   CO2 24 07/12/2015 0813   BUN 18 10/05/2019 0815   BUN 15.7 07/12/2015 0813   CREATININE 1.13 10/05/2019 0815   CREATININE 1.6 (H) 07/12/2015 0813      Component Value Date/Time   CALCIUM 9.6 10/05/2019 0815   CALCIUM 10.1 07/12/2015 0813   ALKPHOS 81 10/05/2019 0815   ALKPHOS 115 07/12/2015 0813   AST 15 10/05/2019 0815   AST 22 07/12/2015 0813   ALT 16 10/05/2019 0815   ALT 24 07/12/2015 0813   BILITOT 0.3 10/05/2019 0815   BILITOT 0.46 07/12/2015 0813       Impression and Plan: Mr. Maher is a 69 year old white male.  He has a past history of renal cell carcinoma and also prostate cancer.  Now, he has a third malignancy.  This is metastatic small cell lung cancer.  It is recurrent.  Hopefully, we are seeing that he is responding.  I will plan for a follow-up PET scan after his third cycle of treatment.  I think  if his sodium starts going down, this would be a good indicator as to what is going on with his cancer.  He gets IV fluids.  He gets these with treatment and also the week he has off of treatment.  We will go ahead with the second cycle of treatment today.  I will plan to have him come back in 3 weeks for his third cycle and then we will rescan after the third cycle.             Volanda Napoleon, MD 3/8/202110:15 AM

## 2019-10-08 ENCOUNTER — Other Ambulatory Visit: Payer: 59

## 2019-10-08 ENCOUNTER — Ambulatory Visit: Payer: 59

## 2019-10-12 ENCOUNTER — Inpatient Hospital Stay: Payer: 59

## 2019-10-12 ENCOUNTER — Other Ambulatory Visit: Payer: Self-pay

## 2019-10-12 VITALS — BP 140/71 | HR 86 | Temp 97.8°F | Resp 16

## 2019-10-12 DIAGNOSIS — C3491 Malignant neoplasm of unspecified part of right bronchus or lung: Secondary | ICD-10-CM

## 2019-10-12 DIAGNOSIS — Z95828 Presence of other vascular implants and grafts: Secondary | ICD-10-CM

## 2019-10-12 DIAGNOSIS — Z8546 Personal history of malignant neoplasm of prostate: Secondary | ICD-10-CM | POA: Diagnosis not present

## 2019-10-12 DIAGNOSIS — Z7982 Long term (current) use of aspirin: Secondary | ICD-10-CM | POA: Diagnosis not present

## 2019-10-12 DIAGNOSIS — E222 Syndrome of inappropriate secretion of antidiuretic hormone: Secondary | ICD-10-CM | POA: Diagnosis not present

## 2019-10-12 DIAGNOSIS — Z5111 Encounter for antineoplastic chemotherapy: Secondary | ICD-10-CM | POA: Diagnosis not present

## 2019-10-12 DIAGNOSIS — Z79899 Other long term (current) drug therapy: Secondary | ICD-10-CM | POA: Diagnosis not present

## 2019-10-12 DIAGNOSIS — L03119 Cellulitis of unspecified part of limb: Secondary | ICD-10-CM | POA: Diagnosis not present

## 2019-10-12 DIAGNOSIS — Z85528 Personal history of other malignant neoplasm of kidney: Secondary | ICD-10-CM | POA: Diagnosis not present

## 2019-10-12 DIAGNOSIS — Z7952 Long term (current) use of systemic steroids: Secondary | ICD-10-CM | POA: Diagnosis not present

## 2019-10-12 LAB — CBC WITH DIFFERENTIAL (CANCER CENTER ONLY)
Abs Immature Granulocytes: 0.02 10*3/uL (ref 0.00–0.07)
Basophils Absolute: 0 10*3/uL (ref 0.0–0.1)
Basophils Relative: 1 %
Eosinophils Absolute: 0 10*3/uL (ref 0.0–0.5)
Eosinophils Relative: 2 %
HCT: 31.3 % — ABNORMAL LOW (ref 39.0–52.0)
Hemoglobin: 10.5 g/dL — ABNORMAL LOW (ref 13.0–17.0)
Immature Granulocytes: 1 %
Lymphocytes Relative: 17 %
Lymphs Abs: 0.3 10*3/uL — ABNORMAL LOW (ref 0.7–4.0)
MCH: 30.5 pg (ref 26.0–34.0)
MCHC: 33.5 g/dL (ref 30.0–36.0)
MCV: 91 fL (ref 80.0–100.0)
Monocytes Absolute: 0.2 10*3/uL (ref 0.1–1.0)
Monocytes Relative: 13 %
Neutro Abs: 1.2 10*3/uL — ABNORMAL LOW (ref 1.7–7.7)
Neutrophils Relative %: 66 %
Platelet Count: 128 10*3/uL — ABNORMAL LOW (ref 150–400)
RBC: 3.44 MIL/uL — ABNORMAL LOW (ref 4.22–5.81)
RDW: 14.2 % (ref 11.5–15.5)
WBC Count: 1.8 10*3/uL — ABNORMAL LOW (ref 4.0–10.5)
nRBC: 0 % (ref 0.0–0.2)

## 2019-10-12 LAB — CMP (CANCER CENTER ONLY)
ALT: 15 U/L (ref 0–44)
AST: 14 U/L — ABNORMAL LOW (ref 15–41)
Albumin: 3.6 g/dL (ref 3.5–5.0)
Alkaline Phosphatase: 66 U/L (ref 38–126)
Anion gap: 7 (ref 5–15)
BUN: 22 mg/dL (ref 8–23)
CO2: 26 mmol/L (ref 22–32)
Calcium: 9.4 mg/dL (ref 8.9–10.3)
Chloride: 102 mmol/L (ref 98–111)
Creatinine: 1.14 mg/dL (ref 0.61–1.24)
GFR, Est AFR Am: >60 mL/min (ref >60)
GFR, Estimated: >60 mL/min (ref >60)
Glucose, Bld: 102 mg/dL — ABNORMAL HIGH (ref 70–99)
Potassium: 4.2 mmol/L (ref 3.5–5.1)
Sodium: 135 mmol/L (ref 135–145)
Total Bilirubin: 0.3 mg/dL (ref 0.3–1.2)
Total Protein: 5.8 g/dL — ABNORMAL LOW (ref 6.5–8.1)

## 2019-10-12 LAB — LACTATE DEHYDROGENASE: LDH: 137 U/L (ref 98–192)

## 2019-10-12 MED ORDER — SODIUM CHLORIDE 0.9% FLUSH
10.0000 mL | Freq: Once | INTRAVENOUS | Status: AC
  Administered 2019-10-12: 10 mL via INTRAVENOUS
  Filled 2019-10-12: qty 10

## 2019-10-12 MED ORDER — SODIUM CHLORIDE 0.9 % IV SOLN
30.0000 mg/m2 | Freq: Once | INTRAVENOUS | Status: AC
  Administered 2019-10-12: 61 mg via INTRAVENOUS
  Filled 2019-10-12: qty 61

## 2019-10-12 MED ORDER — SODIUM CHLORIDE 0.9 % IV SOLN
52.0000 mg/m2 | Freq: Once | INTRAVENOUS | Status: AC
  Administered 2019-10-12: 100 mg via INTRAVENOUS
  Filled 2019-10-12: qty 5

## 2019-10-12 MED ORDER — HEPARIN SOD (PORK) LOCK FLUSH 100 UNIT/ML IV SOLN
500.0000 [IU] | Freq: Once | INTRAVENOUS | Status: AC | PRN
  Administered 2019-10-12: 500 [IU]
  Filled 2019-10-12: qty 5

## 2019-10-12 MED ORDER — POTASSIUM CHLORIDE 2 MEQ/ML IV SOLN
Freq: Once | INTRAVENOUS | Status: AC
  Filled 2019-10-12: qty 10

## 2019-10-12 MED ORDER — SODIUM CHLORIDE 0.9 % IV SOLN
150.0000 mg | Freq: Once | INTRAVENOUS | Status: AC
  Administered 2019-10-12: 150 mg via INTRAVENOUS
  Filled 2019-10-12: qty 150

## 2019-10-12 MED ORDER — PALONOSETRON HCL INJECTION 0.25 MG/5ML
0.2500 mg | Freq: Once | INTRAVENOUS | Status: AC
  Administered 2019-10-12: 0.25 mg via INTRAVENOUS

## 2019-10-12 MED ORDER — SODIUM CHLORIDE 0.9% FLUSH
10.0000 mL | INTRAVENOUS | Status: DC | PRN
  Administered 2019-10-12: 10 mL
  Filled 2019-10-12: qty 10

## 2019-10-12 MED ORDER — PALONOSETRON HCL INJECTION 0.25 MG/5ML
INTRAVENOUS | Status: AC
  Filled 2019-10-12: qty 5

## 2019-10-12 MED ORDER — DEXAMETHASONE SODIUM PHOSPHATE 10 MG/ML IJ SOLN
INTRAMUSCULAR | Status: AC
  Filled 2019-10-12: qty 1

## 2019-10-12 MED ORDER — DEXAMETHASONE SODIUM PHOSPHATE 10 MG/ML IJ SOLN
10.0000 mg | Freq: Once | INTRAMUSCULAR | Status: AC
  Administered 2019-10-12: 10 mg via INTRAVENOUS

## 2019-10-12 NOTE — Patient Instructions (Signed)
[facility] Discharge Instructions for Patients Receiving Chemotherapy  Today you received the following chemotherapy agents Cisplatin, Irinotecan  To help prevent nausea and vomiting after your treatment, we encourage you to take your nausea medication as prescribed by MD.   If you develop nausea and vomiting that is not controlled by your nausea medication, call the clinic.   BELOW ARE SYMPTOMS THAT SHOULD BE REPORTED IMMEDIATELY:  *FEVER GREATER THAN 100.5 F  *CHILLS WITH OR WITHOUT FEVER  NAUSEA AND VOMITING THAT IS NOT CONTROLLED WITH YOUR NAUSEA MEDICATION  *UNUSUAL SHORTNESS OF BREATH  *UNUSUAL BRUISING OR BLEEDING  TENDERNESS IN MOUTH AND THROAT WITH OR WITHOUT PRESENCE OF ULCERS  *URINARY PROBLEMS  *BOWEL PROBLEMS  UNUSUAL RASH Items with * indicate a potential emergency and should be followed up as soon as possible.  Feel free to call the clinic should you have any questions or concerns. The clinic phone number is [phone].  Please show the [facility] at check-in to the Emergency Department and triage nurse.

## 2019-10-12 NOTE — Progress Notes (Signed)
Per dr Marin Olp, okay to treat today despite labs.

## 2019-10-13 ENCOUNTER — Telehealth: Payer: Self-pay | Admitting: *Deleted

## 2019-10-13 NOTE — Telephone Encounter (Signed)
Received a call from patient stating that he had intense diarrhea last night after Chemotherapy.  Took 3 imodium at that time.  Had less intense diarrhea this morning and has taken 4 imodium this morning so far.  Notified Dr. Marin Olp who stated that we do not need to recheck labs this weeknor do we need to change Demeclocycline dose.   Instructed patient on Imodium and told patient to call us if diarrhea continues.  Patient and wife agree and appreciative of call

## 2019-10-14 NOTE — Progress Notes (Incomplete)
  Patient Name: Taylor Elliott MRN: [id number] DOB: 1950/09/13 Referring Physician: Burney Gauze (Profile Not Attached) Date of Service: 09/22/2019 [facility] [place], [place]                                                        End Of Treatment Note  Diagnoses: C79.31-Secondary malignant neoplasm of brain  Cancer Staging: Extensive stage small cell lung cancer  Intent: Palliative  Radiation Treatment Dates: 09/07/2019 through 09/22/2019 Site Technique Total Dose (Gy) Dose per Fx (Gy) Completed Fx Beam Energies  Brain: Brain Complex 20/20 2 10/10 6X   Narrative: The patient tolerated radiation therapy relatively well. At the beginning of treatment, he reported mild fatigue and poor hearing in his left ear, both at baseline. Fatigue was moderate throughout the rest of treatment. He also reported a single episode of nausea that was relieved by Ativan. He denied headaches and vomiting. On examination, there was some mild erythema to the scalp on 09/15/2019. At the end of treatment, the oral cavity was free of secondary infection and there was no significant scalp irritation.  Of note, Dr. Lucia Gaskins, ENT, inserted a tube in his left ear, which resulted in improvement in his hearing.  Plan: The patient will follow-up with radiation oncology in one month.  ________________________________________________   Blair Promise, PhD, MD  This document serves as a record of services personally performed by Gery Pray, MD. It was created on his behalf by Clerance Lav, a trained medical scribe. The creation of this record is based on the scribe's personal observations and the provider's statements to them. This document has been checked and approved by the attending provider.

## 2019-10-19 ENCOUNTER — Telehealth: Payer: Self-pay | Admitting: Hematology & Oncology

## 2019-10-19 ENCOUNTER — Inpatient Hospital Stay: Payer: 59

## 2019-10-19 ENCOUNTER — Encounter: Payer: Self-pay | Admitting: *Deleted

## 2019-10-19 ENCOUNTER — Ambulatory Visit: Payer: Self-pay | Admitting: Radiation Oncology

## 2019-10-19 ENCOUNTER — Other Ambulatory Visit: Payer: Self-pay

## 2019-10-19 ENCOUNTER — Other Ambulatory Visit: Payer: Self-pay | Admitting: *Deleted

## 2019-10-19 VITALS — BP 140/64 | HR 87

## 2019-10-19 DIAGNOSIS — L03119 Cellulitis of unspecified part of limb: Secondary | ICD-10-CM

## 2019-10-19 DIAGNOSIS — C3491 Malignant neoplasm of unspecified part of right bronchus or lung: Secondary | ICD-10-CM | POA: Diagnosis not present

## 2019-10-19 DIAGNOSIS — E222 Syndrome of inappropriate secretion of antidiuretic hormone: Secondary | ICD-10-CM | POA: Diagnosis not present

## 2019-10-19 DIAGNOSIS — E871 Hypo-osmolality and hyponatremia: Secondary | ICD-10-CM

## 2019-10-19 DIAGNOSIS — Z5111 Encounter for antineoplastic chemotherapy: Secondary | ICD-10-CM | POA: Diagnosis not present

## 2019-10-19 DIAGNOSIS — Z8546 Personal history of malignant neoplasm of prostate: Secondary | ICD-10-CM | POA: Diagnosis not present

## 2019-10-19 DIAGNOSIS — E860 Dehydration: Secondary | ICD-10-CM

## 2019-10-19 DIAGNOSIS — Z79899 Other long term (current) drug therapy: Secondary | ICD-10-CM | POA: Diagnosis not present

## 2019-10-19 DIAGNOSIS — Z85528 Personal history of other malignant neoplasm of kidney: Secondary | ICD-10-CM | POA: Diagnosis not present

## 2019-10-19 DIAGNOSIS — Z7952 Long term (current) use of systemic steroids: Secondary | ICD-10-CM | POA: Diagnosis not present

## 2019-10-19 DIAGNOSIS — Z7982 Long term (current) use of aspirin: Secondary | ICD-10-CM | POA: Diagnosis not present

## 2019-10-19 LAB — CMP (CANCER CENTER ONLY)
ALT: 14 U/L (ref 0–44)
AST: 12 U/L — ABNORMAL LOW (ref 15–41)
Albumin: 3.6 g/dL (ref 3.5–5.0)
Alkaline Phosphatase: 76 U/L (ref 38–126)
Anion gap: 7 (ref 5–15)
BUN: 15 mg/dL (ref 8–23)
CO2: 23 mmol/L (ref 22–32)
Calcium: 9.3 mg/dL (ref 8.9–10.3)
Chloride: 90 mmol/L — ABNORMAL LOW (ref 98–111)
Creatinine: 0.95 mg/dL (ref 0.61–1.24)
GFR, Est AFR Am: >60 mL/min (ref >60)
GFR, Estimated: >60 mL/min (ref >60)
Glucose, Bld: 109 mg/dL — ABNORMAL HIGH (ref 70–99)
Potassium: 4.1 mmol/L (ref 3.5–5.1)
Sodium: 120 mmol/L — ABNORMAL LOW (ref 135–145)
Total Bilirubin: 0.4 mg/dL (ref 0.3–1.2)
Total Protein: 5.9 g/dL — ABNORMAL LOW (ref 6.5–8.1)

## 2019-10-19 LAB — CBC WITH DIFFERENTIAL (CANCER CENTER ONLY)
Abs Immature Granulocytes: 0.01 10*3/uL (ref 0.00–0.07)
Basophils Absolute: 0 10*3/uL (ref 0.0–0.1)
Basophils Relative: 1 %
Eosinophils Absolute: 0 10*3/uL (ref 0.0–0.5)
Eosinophils Relative: 1 %
HCT: 27.3 % — ABNORMAL LOW (ref 39.0–52.0)
Hemoglobin: 9.7 g/dL — ABNORMAL LOW (ref 13.0–17.0)
Immature Granulocytes: 1 %
Lymphocytes Relative: 12 %
Lymphs Abs: 0.2 10*3/uL — ABNORMAL LOW (ref 0.7–4.0)
MCH: 30.3 pg (ref 26.0–34.0)
MCHC: 35.5 g/dL (ref 30.0–36.0)
MCV: 85.3 fL (ref 80.0–100.0)
Monocytes Absolute: 0.2 10*3/uL (ref 0.1–1.0)
Monocytes Relative: 12 %
Neutro Abs: 1.3 10*3/uL — ABNORMAL LOW (ref 1.7–7.7)
Neutrophils Relative %: 73 %
Platelet Count: 112 10*3/uL — ABNORMAL LOW (ref 150–400)
RBC: 3.2 MIL/uL — ABNORMAL LOW (ref 4.22–5.81)
RDW: 14 % (ref 11.5–15.5)
WBC Count: 1.8 10*3/uL — ABNORMAL LOW (ref 4.0–10.5)
nRBC: 0 % (ref 0.0–0.2)

## 2019-10-19 MED ORDER — SODIUM CHLORIDE 0.9 % IV SOLN
Freq: Once | INTRAVENOUS | Status: AC
  Filled 2019-10-19: qty 250

## 2019-10-19 MED ORDER — HEPARIN SOD (PORK) LOCK FLUSH 100 UNIT/ML IV SOLN
500.0000 [IU] | Freq: Once | INTRAVENOUS | Status: AC | PRN
  Administered 2019-10-19: 500 [IU]
  Filled 2019-10-19: qty 5

## 2019-10-19 MED ORDER — SODIUM CHLORIDE 0.9% FLUSH
10.0000 mL | INTRAVENOUS | Status: DC | PRN
  Administered 2019-10-19: 10 mL
  Filled 2019-10-19: qty 10

## 2019-10-19 NOTE — Patient Instructions (Signed)

## 2019-10-19 NOTE — Telephone Encounter (Signed)
Appointments scheduled calendar printed per 3/22 secure chat

## 2019-10-19 NOTE — Patient Instructions (Signed)

## 2019-10-19 NOTE — Progress Notes (Unsigned)
Dr. Marin Olp notified of NA-120.  Order received for pt to increase Declomycin to two tablets twice a day and to come back tomorrow for labs and ivf's.  Pt notified and message sent to scheduling.

## 2019-10-19 NOTE — Progress Notes (Signed)
Pharmacist Chemotherapy Monitoring - Follow Up Assessment    I verify that I have reviewed each item in the below checklist:  . Regimen for the patient is scheduled for the appropriate day and plan matches scheduled date. Marland Kitchen Appropriate non-routine labs are ordered dependent on drug ordered. . If applicable, additional medications reviewed and ordered per protocol based on lifetime cumulative doses and/or treatment regimen.   Plan for follow-up and/or issues identified: No . I-vent associated with next due treatment: No . MD and/or nursing notified: No  Taylor Elliott, [name] 10/19/2019 7:55 AM

## 2019-10-20 ENCOUNTER — Inpatient Hospital Stay: Payer: 59

## 2019-10-20 VITALS — BP 128/62 | HR 78 | Temp 98.4°F | Resp 18

## 2019-10-20 DIAGNOSIS — C3491 Malignant neoplasm of unspecified part of right bronchus or lung: Secondary | ICD-10-CM

## 2019-10-20 DIAGNOSIS — Z7952 Long term (current) use of systemic steroids: Secondary | ICD-10-CM | POA: Diagnosis not present

## 2019-10-20 DIAGNOSIS — L03119 Cellulitis of unspecified part of limb: Secondary | ICD-10-CM

## 2019-10-20 DIAGNOSIS — Z5111 Encounter for antineoplastic chemotherapy: Secondary | ICD-10-CM | POA: Diagnosis not present

## 2019-10-20 DIAGNOSIS — Z79899 Other long term (current) drug therapy: Secondary | ICD-10-CM | POA: Diagnosis not present

## 2019-10-20 DIAGNOSIS — Z85528 Personal history of other malignant neoplasm of kidney: Secondary | ICD-10-CM | POA: Diagnosis not present

## 2019-10-20 DIAGNOSIS — E860 Dehydration: Secondary | ICD-10-CM

## 2019-10-20 DIAGNOSIS — Z7982 Long term (current) use of aspirin: Secondary | ICD-10-CM | POA: Diagnosis not present

## 2019-10-20 DIAGNOSIS — E222 Syndrome of inappropriate secretion of antidiuretic hormone: Secondary | ICD-10-CM | POA: Diagnosis not present

## 2019-10-20 DIAGNOSIS — Z8546 Personal history of malignant neoplasm of prostate: Secondary | ICD-10-CM | POA: Diagnosis not present

## 2019-10-20 DIAGNOSIS — E871 Hypo-osmolality and hyponatremia: Secondary | ICD-10-CM

## 2019-10-20 LAB — CMP (CANCER CENTER ONLY)
ALT: 13 U/L (ref 0–44)
AST: 13 U/L — ABNORMAL LOW (ref 15–41)
Albumin: 3.4 g/dL — ABNORMAL LOW (ref 3.5–5.0)
Alkaline Phosphatase: 71 U/L (ref 38–126)
Anion gap: 6 (ref 5–15)
BUN: 13 mg/dL (ref 8–23)
CO2: 23 mmol/L (ref 22–32)
Calcium: 8.7 mg/dL — ABNORMAL LOW (ref 8.9–10.3)
Chloride: 92 mmol/L — ABNORMAL LOW (ref 98–111)
Creatinine: 0.85 mg/dL (ref 0.61–1.24)
GFR, Est AFR Am: >60 mL/min (ref >60)
GFR, Estimated: >60 mL/min (ref >60)
Glucose, Bld: 97 mg/dL (ref 70–99)
Potassium: 4.2 mmol/L (ref 3.5–5.1)
Sodium: 121 mmol/L — ABNORMAL LOW (ref 135–145)
Total Bilirubin: 0.3 mg/dL (ref 0.3–1.2)
Total Protein: 5.5 g/dL — ABNORMAL LOW (ref 6.5–8.1)

## 2019-10-20 LAB — CBC WITH DIFFERENTIAL (CANCER CENTER ONLY)
Abs Immature Granulocytes: 0 10*3/uL (ref 0.00–0.07)
Basophils Absolute: 0 10*3/uL (ref 0.0–0.1)
Basophils Relative: 1 %
Eosinophils Absolute: 0 10*3/uL (ref 0.0–0.5)
Eosinophils Relative: 1 %
HCT: 25.5 % — ABNORMAL LOW (ref 39.0–52.0)
Hemoglobin: 9.1 g/dL — ABNORMAL LOW (ref 13.0–17.0)
Immature Granulocytes: 0 %
Lymphocytes Relative: 16 %
Lymphs Abs: 0.3 10*3/uL — ABNORMAL LOW (ref 0.7–4.0)
MCH: 30.7 pg (ref 26.0–34.0)
MCHC: 35.7 g/dL (ref 30.0–36.0)
MCV: 86.1 fL (ref 80.0–100.0)
Monocytes Absolute: 0.3 10*3/uL (ref 0.1–1.0)
Monocytes Relative: 14 %
Neutro Abs: 1.2 10*3/uL — ABNORMAL LOW (ref 1.7–7.7)
Neutrophils Relative %: 68 %
Platelet Count: 121 10*3/uL — ABNORMAL LOW (ref 150–400)
RBC: 2.96 MIL/uL — ABNORMAL LOW (ref 4.22–5.81)
RDW: 14.2 % (ref 11.5–15.5)
WBC Count: 1.8 10*3/uL — ABNORMAL LOW (ref 4.0–10.5)
nRBC: 0 % (ref 0.0–0.2)

## 2019-10-20 MED ORDER — SODIUM CHLORIDE 0.9% FLUSH
10.0000 mL | INTRAVENOUS | Status: DC | PRN
  Administered 2019-10-20: 10 mL
  Filled 2019-10-20: qty 10

## 2019-10-20 MED ORDER — SODIUM CHLORIDE 0.9 % IV SOLN
Freq: Once | INTRAVENOUS | Status: AC
  Filled 2019-10-20: qty 250

## 2019-10-20 MED ORDER — HEPARIN SOD (PORK) LOCK FLUSH 100 UNIT/ML IV SOLN
500.0000 [IU] | Freq: Once | INTRAVENOUS | Status: AC | PRN
  Administered 2019-10-20: 500 [IU]
  Filled 2019-10-20: qty 5

## 2019-10-20 NOTE — Patient Instructions (Signed)

## 2019-10-22 ENCOUNTER — Ambulatory Visit
Admission: RE | Admit: 2019-10-22 | Discharge: 2019-10-22 | Disposition: A | Discharge status: Home / Self Care | Payer: 59 | Source: Ambulatory Visit | Attending: Radiation Oncology | Admitting: Radiation Oncology

## 2019-10-22 ENCOUNTER — Ambulatory Visit: Payer: Self-pay | Admitting: Radiation Oncology

## 2019-10-22 ENCOUNTER — Other Ambulatory Visit: Payer: Self-pay

## 2019-10-22 ENCOUNTER — Encounter: Payer: Self-pay | Admitting: Radiation Oncology

## 2019-10-22 VITALS — BP 142/73 | HR 74 | Temp 98.2°F | Resp 18 | Ht 68.0 in | Wt 196.5 lb

## 2019-10-22 DIAGNOSIS — C7931 Secondary malignant neoplasm of brain: Secondary | ICD-10-CM | POA: Insufficient documentation

## 2019-10-22 DIAGNOSIS — Z7982 Long term (current) use of aspirin: Secondary | ICD-10-CM | POA: Diagnosis not present

## 2019-10-22 DIAGNOSIS — Z79899 Other long term (current) drug therapy: Secondary | ICD-10-CM | POA: Insufficient documentation

## 2019-10-22 DIAGNOSIS — Z923 Personal history of irradiation: Secondary | ICD-10-CM | POA: Insufficient documentation

## 2019-10-22 DIAGNOSIS — E871 Hypo-osmolality and hyponatremia: Secondary | ICD-10-CM | POA: Diagnosis not present

## 2019-10-22 DIAGNOSIS — C3491 Malignant neoplasm of unspecified part of right bronchus or lung: Secondary | ICD-10-CM | POA: Insufficient documentation

## 2019-10-22 DIAGNOSIS — C7949 Secondary malignant neoplasm of other parts of nervous system: Secondary | ICD-10-CM

## 2019-10-22 DIAGNOSIS — R519 Headache, unspecified: Secondary | ICD-10-CM | POA: Diagnosis not present

## 2019-10-22 DIAGNOSIS — R2689 Other abnormalities of gait and mobility: Secondary | ICD-10-CM | POA: Diagnosis not present

## 2019-10-22 MED FILL — DEMECLOCYCLINE 150 MG TAB: 150 | 30 days supply | Qty: 120 | Fill #2

## 2019-10-22 NOTE — Progress Notes (Signed)
Mr. Nehme presents today for f/u with Dr. Sondra Come. Pt denies c/o pain. Pt reports balance is "shot". Pt reports that headaches are very "short-lived". Pt reports fatigue is lingering. Pt is receiving infusion from Dr. Marin Olp. Pt is continuing to struggle with hyponatremia. Pt is on demeclocycline for hyponatremia.  BP (!) 142/73 (BP Location: Left Arm, Patient Position: Sitting)   Pulse 74   Temp 98.2 F (36.8 C) (Temporal)   Resp 18   Ht 5\' 8"  (1.727 m)   Wt 196 lb 8 oz (89.1 kg)   SpO2 100%   BMI 29.88 kg/m   Wt Readings from Last 3 Encounters:  10/22/19 196 lb 8 oz (89.1 kg)  09/04/19 191 lb (86.6 kg)  09/03/19 191 lb 6.4 oz (86.8 kg)   Loma Sousa, RN BSN

## 2019-10-22 NOTE — Patient Instructions (Signed)
Coronavirus (COVID-19) Are you at risk?  Are you at risk for the Coronavirus (COVID-19)?  To be considered HIGH RISK for Coronavirus (COVID-19), you have to meet the following criteria:  . Traveled to [place], [place], [place], [place] or [place]; or in the [place] to [place], [place], [place], or [place]; and have fever, cough, and shortness of breath within the last 2 weeks of travel OR . Been in close contact with a person diagnosed with COVID-19 within the last 2 weeks and have fever, cough, and shortness of breath . IF YOU DO NOT MEET THESE CRITERIA, YOU ARE CONSIDERED LOW RISK FOR COVID-19.  What to do if you are HIGH RISK for COVID-19?  . If you are having a medical emergency, call 911. . Seek medical care right away. Before you go to a doctor's office, urgent care or emergency department, call ahead and tell them about your recent travel, contact with someone diagnosed with COVID-19, and your symptoms. You should receive instructions from your physician's office regarding next steps of care.  . When you arrive at healthcare provider, tell the healthcare staff immediately you have returned from visiting [place], [place], [place], [place] or [place]; or traveled in the [place] to [place], [place], [place], or [place]; in the last two weeks or you have been in close contact with a person diagnosed with COVID-19 in the last 2 weeks.   . Tell the health care staff about your symptoms: fever, cough and shortness of breath. . After you have been seen by a medical provider, you will be either: o Tested for (COVID-19) and discharged home on quarantine except to seek medical care if symptoms worsen, and asked to  - Stay home and avoid contact with others until you get your results (4-5 days)  - Avoid travel on public transportation if possible (such as bus, train, or airplane) or o Sent to the Emergency Department by EMS for evaluation, COVID-19 testing, and possible  admission depending on your condition and test results.  What to do if you are LOW RISK for COVID-19?  Reduce your risk of any infection by using the same precautions used for avoiding the common cold or flu:  . Wash your hands often with soap and warm water for at least 20 seconds.  If soap and water are not readily available, use an alcohol-based hand sanitizer with at least 60% alcohol.  . If coughing or sneezing, cover your mouth and nose by coughing or sneezing into the elbow areas of your shirt or coat, into a tissue or into your sleeve (not your hands). . Avoid shaking hands with others and consider head nods or verbal greetings only. . Avoid touching your eyes, nose, or mouth with unwashed hands.  . Avoid close contact with people who are sick. . Avoid places or events with large numbers of people in one location, like concerts or sporting events. . Carefully consider travel plans you have or are making. . If you are planning any travel outside or inside the [place], visit the CDC's Travelers' Health webpage for the latest health notices. . If you have some symptoms but not all symptoms, continue to monitor at home and seek medical attention if your symptoms worsen. . If you are having a medical emergency, call 911.   ADDITIONAL HEALTHCARE OPTIONS FOR PATIENTS  [facility] / e-Visit: [email or web address]         [facility]: [phone]  [facility]: [phone]                   [facility]: [phone]   

## 2019-10-22 NOTE — Progress Notes (Signed)
Radiation Oncology         [phone] ________________________________  Name: Taylor Elliott MRN: [id number]  Date: 10/22/2019  DOB: 1950-11-24  Follow-Up Visit Note  CC: Ann Held, DO  [name], MD    ICD-10-CM   1. Small cell lung cancer, right (HCC)  C34.91   2. Secondary malignant neoplasm of brain and spinal cord [facility])  C79.31    C79.49     Diagnosis:   Extensive stage small cell lung cancer  Interval Since Last Radiation:  1 month   09/07/2019 through 09/22/2019 Site Technique Total Dose (Gy) Dose per Fx (Gy) Completed Fx Beam Energies  Brain: Brain Complex 20/20 2 10/10 6X   03/10/2019 through 03/27/2019 Site Technique Total Dose Dose per Fx Completed Fx Beam Energies  Brain: Brain Complex 35/35 2.5 14/14 6X    Narrative:  The patient returns today for routine follow-up. He last saw Dr. Marin Olp on 10/05/2019. He continues on chemotherapy and is s/p two cycles.    On review of systems, he reports gait imbalance, short-lasting headaches, and fatigue. He also continues to struggle with hyponatremia, and he takes demeclocycline for this.  He denies any nausea issues.  ALLERGIES:  is allergic to bee venom and clindamycin/lincomycin.  Meds: Current Outpatient Medications  Medication Sig Dispense Refill  . amLODipine (NORVASC) 10 MG tablet Take 10 mg by mouth daily.    Marland Kitchen amoxicillin (AMOXIL) 875 MG tablet Take 1 tablet (875 mg total) by mouth 2 (two) times daily. 20 tablet 0  . aspirin EC 81 MG tablet Take 81 mg by mouth daily.    . calcium carbonate (TUMS - DOSED IN MG ELEMENTAL CALCIUM) 500 MG chewable tablet Chew 2 tablets by mouth daily as needed for indigestion or heartburn.     . COMBIVENT RESPIMAT 20-100 MCG/ACT AERS respimat 2 (two) times daily.     Marland Kitchen demeclocycline (DECLOMYCIN) 150 MG tablet     . dexamethasone (DECADRON) 4 MG tablet Take 2 tablets once a day on the day after chemotherapy and then take 2 tablets two times a day for 2 days. Take with  food. 30 tablet 1  . dronabinol (MARINOL) 5 MG capsule Take 1 capsule (5 mg total) by mouth 2 (two) times daily before a meal. 60 capsule 1  . EPINEPHrine 0.3 mg/0.3 mL IJ SOAJ injection Inject 0.3 mg into the muscle once.    . fenofibrate 160 MG tablet TAKE 1 TABLET (160 MG TOTAL) BY MOUTH DAILY. 90 tablet 1  . HYDROcodone-homatropine (HYCODAN) 5-1.5 MG/5ML syrup Take 5 mLs by mouth every 4 (four) hours as needed for cough. 300 mL 0  . lactulose (CHRONULAC) 10 GM/15ML solution Take 15 mLs (10 g total) by mouth 2 (two) times daily as needed for mild constipation. 473 mL 1  . lidocaine-prilocaine (EMLA) cream Apply to affected area once 30 g 3  . loperamide (IMODIUM A-D) 2 MG tablet Take 2 at diarrhea onset, then 1 every 2hr until 12hrs with no BM. May take 2 every 4hrs at night. If diarrhea recurs repeat. 100 tablet 1  . LORazepam (ATIVAN) 0.5 MG tablet Take 1 tablet (0.5 mg total) by mouth every 6 (six) hours as needed (Nausea or vomiting). 30 tablet 0  . LORazepam (ATIVAN) 1 MG tablet TAKE 1 TABLET (1 MG TOTAL) BY MOUTH EVERY 6 (SIX) HOURS AS NEEDED FOR ANXIETY. 60 tablet 0  . Multiple Vitamin (MULTIVITAMIN) tablet Take 1 tablet by mouth daily.    Marland Kitchen  ondansetron (ZOFRAN) 8 MG tablet Take 1 tablet (8 mg total) by mouth 2 (two) times daily as needed. Start on the third day after chemotherapy. 30 tablet 1  . prochlorperazine (COMPAZINE) 10 MG tablet Take 1 tablet (10 mg total) by mouth every 6 (six) hours as needed (Nausea or vomiting). 30 tablet 1  . pyridoxine (B-6) 200 MG tablet Take 200 mg by mouth daily.    . solifenacin (VESICARE) 10 MG tablet Take 10 mg by mouth every evening.    . temazepam (RESTORIL) 30 MG capsule Take 1 capsule (30 mg total) by mouth at bedtime as needed for sleep. 30 capsule 2  . triamcinolone (NASACORT) 55 MCG/ACT AERO nasal inhaler     . UNABLE TO FIND Med Name: Allergy shots.     No current facility-administered medications for this encounter.    Physical Findings:  The patient is in no acute distress. Patient is alert and oriented.  He continues to have good recall of his treatment, medications  height is 5\' 8"  (1.727 m) and weight is 196 lb 8 oz (89.1 kg). His temporal temperature is 98.2 F (36.8 C). His blood pressure is 142/73 (abnormal) and his pulse is 74. His respiration is 18 and oxygen saturation is 100%. .  No significant changes. Lungs are clear to auscultation bilaterally. Heart has regular rate and rhythm. No palpable cervical, supraclavicular, or axillary adenopathy. Abdomen soft, non-tender, normal bowel sounds.  Ambulates with the assistance of a cane which is his baseline.    Lab Findings: Lab Results  Component Value Date   WBC 1.8 (L) 10/20/2019   HGB 9.1 (L) 10/20/2019   HCT 25.5 (L) 10/20/2019   MCV 86.1 10/20/2019   PLT 121 (L) 10/20/2019    Radiographic Findings: No results found.  Impression:  The patient is recovering from the effects of radiation.  He continues on systemic therapy  Plan: Patient will be scheduled for an MRI of the brain in approximately a month and follow-up soon afterward.  ____________________________________ Gery Pray, MD    This document serves as a record of services personally performed by Gery Pray, MD. It was created on his behalf by Wilburn Mylar, a trained medical scribe. The creation of this record is based on the scribe's personal observations and the provider's statements to them. This document has been checked and approved by the attending provider.

## 2019-10-23 ENCOUNTER — Inpatient Hospital Stay: Payer: 59

## 2019-10-23 ENCOUNTER — Other Ambulatory Visit: Payer: Self-pay | Admitting: *Deleted

## 2019-10-23 VITALS — BP 124/58 | HR 77 | Temp 97.9°F | Resp 17

## 2019-10-23 DIAGNOSIS — Z95828 Presence of other vascular implants and grafts: Secondary | ICD-10-CM

## 2019-10-23 DIAGNOSIS — C3491 Malignant neoplasm of unspecified part of right bronchus or lung: Secondary | ICD-10-CM

## 2019-10-23 DIAGNOSIS — E860 Dehydration: Secondary | ICD-10-CM

## 2019-10-23 DIAGNOSIS — L03119 Cellulitis of unspecified part of limb: Secondary | ICD-10-CM | POA: Diagnosis not present

## 2019-10-23 DIAGNOSIS — Z5111 Encounter for antineoplastic chemotherapy: Secondary | ICD-10-CM | POA: Diagnosis not present

## 2019-10-23 DIAGNOSIS — Z7982 Long term (current) use of aspirin: Secondary | ICD-10-CM | POA: Diagnosis not present

## 2019-10-23 DIAGNOSIS — E871 Hypo-osmolality and hyponatremia: Secondary | ICD-10-CM

## 2019-10-23 DIAGNOSIS — Z8546 Personal history of malignant neoplasm of prostate: Secondary | ICD-10-CM | POA: Diagnosis not present

## 2019-10-23 DIAGNOSIS — Z85528 Personal history of other malignant neoplasm of kidney: Secondary | ICD-10-CM | POA: Diagnosis not present

## 2019-10-23 DIAGNOSIS — Z7952 Long term (current) use of systemic steroids: Secondary | ICD-10-CM | POA: Diagnosis not present

## 2019-10-23 DIAGNOSIS — E222 Syndrome of inappropriate secretion of antidiuretic hormone: Secondary | ICD-10-CM | POA: Diagnosis not present

## 2019-10-23 DIAGNOSIS — Z79899 Other long term (current) drug therapy: Secondary | ICD-10-CM | POA: Diagnosis not present

## 2019-10-23 LAB — CBC WITH DIFFERENTIAL (CANCER CENTER ONLY)
Abs Immature Granulocytes: 0.01 10*3/uL (ref 0.00–0.07)
Basophils Absolute: 0 10*3/uL (ref 0.0–0.1)
Basophils Relative: 1 %
Eosinophils Absolute: 0 10*3/uL (ref 0.0–0.5)
Eosinophils Relative: 1 %
HCT: 27.4 % — ABNORMAL LOW (ref 39.0–52.0)
Hemoglobin: 9.7 g/dL — ABNORMAL LOW (ref 13.0–17.0)
Immature Granulocytes: 1 %
Lymphocytes Relative: 13 %
Lymphs Abs: 0.2 10*3/uL — ABNORMAL LOW (ref 0.7–4.0)
MCH: 31.1 pg (ref 26.0–34.0)
MCHC: 35.4 g/dL (ref 30.0–36.0)
MCV: 87.8 fL (ref 80.0–100.0)
Monocytes Absolute: 0.3 10*3/uL (ref 0.1–1.0)
Monocytes Relative: 17 %
Neutro Abs: 1.1 10*3/uL — ABNORMAL LOW (ref 1.7–7.7)
Neutrophils Relative %: 67 %
Platelet Count: 112 10*3/uL — ABNORMAL LOW (ref 150–400)
RBC: 3.12 MIL/uL — ABNORMAL LOW (ref 4.22–5.81)
RDW: 15.3 % (ref 11.5–15.5)
WBC Count: 1.6 10*3/uL — ABNORMAL LOW (ref 4.0–10.5)
nRBC: 0 % (ref 0.0–0.2)

## 2019-10-23 LAB — CMP (CANCER CENTER ONLY)
ALT: 15 U/L (ref 0–44)
AST: 13 U/L — ABNORMAL LOW (ref 15–41)
Albumin: 3.7 g/dL (ref 3.5–5.0)
Alkaline Phosphatase: 74 U/L (ref 38–126)
Anion gap: 7 (ref 5–15)
BUN: 16 mg/dL (ref 8–23)
CO2: 24 mmol/L (ref 22–32)
Calcium: 9.2 mg/dL (ref 8.9–10.3)
Chloride: 94 mmol/L — ABNORMAL LOW (ref 98–111)
Creatinine: 0.98 mg/dL (ref 0.61–1.24)
GFR, Est AFR Am: >60 mL/min (ref >60)
GFR, Estimated: >60 mL/min (ref >60)
Glucose, Bld: 99 mg/dL (ref 70–99)
Potassium: 4.5 mmol/L (ref 3.5–5.1)
Sodium: 125 mmol/L — ABNORMAL LOW (ref 135–145)
Total Bilirubin: 0.3 mg/dL (ref 0.3–1.2)
Total Protein: 5.9 g/dL — ABNORMAL LOW (ref 6.5–8.1)

## 2019-10-23 MED ORDER — SODIUM CHLORIDE 0.9 % IV SOLN
Freq: Once | INTRAVENOUS | Status: AC
  Filled 2019-10-23: qty 250

## 2019-10-23 MED ORDER — SODIUM CHLORIDE 0.9% FLUSH
10.0000 mL | Freq: Once | INTRAVENOUS | Status: AC
  Administered 2019-10-23: 10 mL via INTRAVENOUS
  Filled 2019-10-23: qty 10

## 2019-10-23 MED ORDER — HEPARIN SOD (PORK) LOCK FLUSH 100 UNIT/ML IV SOLN
500.0000 [IU] | Freq: Once | INTRAVENOUS | Status: AC
  Administered 2019-10-23: 500 [IU] via INTRAVENOUS
  Filled 2019-10-23: qty 5

## 2019-10-23 NOTE — Patient Instructions (Signed)

## 2019-10-23 NOTE — Patient Instructions (Signed)
Dehydration, Adult Dehydration is a condition in which there is not enough water or other fluids in the body. This happens when a person loses more fluids than he or she takes in. Important organs, such as the kidneys, brain, and heart, cannot function without a proper amount of fluids. Any loss of fluids from the body can lead to dehydration. Dehydration can be mild, moderate, or severe. It should be treated right away to prevent it from becoming severe. What are the causes? Dehydration may be caused by:  Conditions that cause loss of water or other fluids, such as diarrhea, vomiting, or sweating or urinating a lot.  Not drinking enough fluids, especially when you are ill or doing activities that require a lot of energy.  Other illnesses and conditions, such as fever or infection.  Certain medicines, such as medicines that remove excess fluid from the body (diuretics).  Lack of safe drinking water.  Not being able to get enough water and food. What increases the risk? The following factors may make you more likely to develop this condition:  Having a long-term (chronic) illness that has not been treated properly, such as diabetes, heart disease, or kidney disease.  Being 65 years of age or older.  Having a disability.  Living in a place that is high in altitude, where thinner, drier air causes more fluid loss.  Doing exercises that put stress on your body for a long time (endurance sports). What are the signs or symptoms? Symptoms of dehydration depend on how severe it is. Mild or moderate dehydration  Thirst.  Dry lips or dry mouth.  Dizziness or light-headedness, especially when standing up from a seated position.  Muscle cramps.  Dark urine. Urine may be the color of tea.  Less urine or tears produced than usual.  Headache. Severe dehydration  Changes in skin. Your skin may be cold and clammy, blotchy, or pale. Your skin also may not return to normal after being  lightly pinched and released.  Little or no tears, urine, or sweat.  Changes in vital signs, such as rapid breathing and low blood pressure. Your pulse may be weak or may be faster than 100 beats a minute when you are sitting still.  Other changes, such as: ? Feeling very thirsty. ? Sunken eyes. ? Cold hands and feet. ? Confusion. ? Being very tired (lethargic) or having trouble waking from sleep. ? Short-term weight loss. ? Loss of consciousness. How is this diagnosed? This condition is diagnosed based on your symptoms and a physical exam. You may have blood and urine tests to help confirm the diagnosis. How is this treated? Treatment for this condition depends on how severe it is. Treatment should be started right away. Do not wait until dehydration becomes severe. Severe dehydration is an emergency and needs to be treated in a hospital.  Mild or moderate dehydration can be treated at home. You may be asked to: ? Drink more fluids. ? Drink an oral rehydration solution (ORS). This drink helps restore proper amounts of fluids and salts and minerals in the blood (electrolytes).  Severe dehydration can be treated: ? With IV fluids. ? By correcting abnormal levels of electrolytes. This is often done by giving electrolytes through a tube that is passed through your nose and into your stomach (nasogastric tube, or NG tube). ? By treating the underlying cause of dehydration. Follow these instructions at home: Oral rehydration solution If told by your health care provider, drink an ORS:  Make   an ORS by following instructions on the package.  Start by drinking small amounts, about  cup (120 mL) every 5-10 minutes.  Slowly increase how much you drink until you have taken the amount recommended by your health care provider. Eating and drinking         Drink enough clear fluid to keep your urine pale yellow. If you were told to drink an ORS, finish the ORS first and then start slowly  drinking other clear fluids. Drink fluids such as: ? Water. Do not drink only water. Doing that can lead to hyponatremia, which is having too little salt (sodium) in the body. ? Water from ice chips you suck on. ? Fruit juice that you have added water to (diluted fruit juice). ? Low-calorie sports drinks.  Eat foods that contain a healthy balance of electrolytes, such as bananas, oranges, potatoes, tomatoes, and spinach.  Do not drink alcohol.  Avoid the following: ? Drinks that contain a lot of sugar. These include high-calorie sports drinks, fruit juice that is not diluted, and soda. ? Caffeine. ? Foods that are greasy or contain a lot of fat or sugar. General instructions  Take over-the-counter and prescription medicines only as told by your health care provider.  Do not take sodium tablets. Doing that can lead to having too much sodium in the body (hypernatremia).  Return to your normal activities as told by your health care provider. Ask your health care provider what activities are safe for you.  Keep all follow-up visits as told by your health care provider. This is important. Contact a health care provider if:  You have muscle cramps, pain, or discomfort, such as: ? Pain in your abdomen and the pain gets worse or stays in one area (localizes). ? Stiff neck.  You have a rash.  You are more irritable than usual.  You are sleepier or have a harder time waking than usual.  You feel weak or dizzy.  You feel very thirsty. Get help right away if you have:  Any symptoms of severe dehydration.  Symptoms of vomiting, such as: ? You cannot eat or drink without vomiting. ? Vomiting gets worse or does not go away. ? Vomit includes blood or green matter (bile).  Symptoms that get worse with treatment.  A fever.  A severe headache.  Problems with urination or bowel movements, such as: ? Diarrhea that gets worse or does not go away. ? Blood in your stool (feces). This  may cause stool to look black and tarry. ? Not urinating, or urinating only a small amount of very dark urine, within 6-8 hours.  Trouble breathing. These symptoms may represent a serious problem that is an emergency. Do not wait to see if the symptoms will go away. Get medical help right away. Call your local emergency services (911 in the [place] Do not drive yourself to the hospital. Summary  Dehydration is a condition in which there is not enough water or other fluids in the body. This happens when a person loses more fluids than he or she takes in.  Treatment for this condition depends on how severe it is. Treatment should be started right away. Do not wait until dehydration becomes severe.  Drink enough clear fluid to keep your urine pale yellow. If you were told to drink an oral rehydration solution (ORS), finish the ORS first and then start slowly drinking other clear fluids.  Take over-the-counter and prescription medicines only as told by your health care   provider.  Get help right away if you have any symptoms of severe dehydration. This information is not intended to replace advice given to you by your health care provider. Make sure you discuss any questions you have with your health care provider. Document Revised: 02/26/2019 Document Reviewed: 02/26/2019 Elsevier Patient Education  2020 [employer] Inc[employer]   

## 2019-10-26 ENCOUNTER — Telehealth: Payer: Self-pay | Admitting: *Deleted

## 2019-10-26 ENCOUNTER — Other Ambulatory Visit: Payer: Self-pay

## 2019-10-26 ENCOUNTER — Inpatient Hospital Stay: Payer: 59

## 2019-10-26 ENCOUNTER — Other Ambulatory Visit: Payer: Self-pay | Admitting: Hematology & Oncology

## 2019-10-26 ENCOUNTER — Encounter: Payer: Self-pay | Admitting: Hematology & Oncology

## 2019-10-26 ENCOUNTER — Inpatient Hospital Stay (HOSPITAL_BASED_OUTPATIENT_CLINIC_OR_DEPARTMENT_OTHER): Payer: 59 | Admitting: Hematology & Oncology

## 2019-10-26 VITALS — BP 132/67 | HR 78 | Temp 97.7°F | Resp 18 | Wt 195.0 lb

## 2019-10-26 DIAGNOSIS — Z7952 Long term (current) use of systemic steroids: Secondary | ICD-10-CM | POA: Diagnosis not present

## 2019-10-26 DIAGNOSIS — E222 Syndrome of inappropriate secretion of antidiuretic hormone: Secondary | ICD-10-CM | POA: Diagnosis not present

## 2019-10-26 DIAGNOSIS — L03119 Cellulitis of unspecified part of limb: Secondary | ICD-10-CM | POA: Diagnosis not present

## 2019-10-26 DIAGNOSIS — Z79899 Other long term (current) drug therapy: Secondary | ICD-10-CM | POA: Diagnosis not present

## 2019-10-26 DIAGNOSIS — Z5111 Encounter for antineoplastic chemotherapy: Secondary | ICD-10-CM | POA: Diagnosis not present

## 2019-10-26 DIAGNOSIS — C3491 Malignant neoplasm of unspecified part of right bronchus or lung: Secondary | ICD-10-CM | POA: Diagnosis not present

## 2019-10-26 DIAGNOSIS — Z8546 Personal history of malignant neoplasm of prostate: Secondary | ICD-10-CM | POA: Diagnosis not present

## 2019-10-26 DIAGNOSIS — E860 Dehydration: Secondary | ICD-10-CM

## 2019-10-26 DIAGNOSIS — Z7982 Long term (current) use of aspirin: Secondary | ICD-10-CM | POA: Diagnosis not present

## 2019-10-26 DIAGNOSIS — Z85528 Personal history of other malignant neoplasm of kidney: Secondary | ICD-10-CM | POA: Diagnosis not present

## 2019-10-26 DIAGNOSIS — E871 Hypo-osmolality and hyponatremia: Secondary | ICD-10-CM

## 2019-10-26 LAB — CBC WITH DIFFERENTIAL (CANCER CENTER ONLY)
Abs Immature Granulocytes: 0 10*3/uL (ref 0.00–0.07)
Basophils Absolute: 0 10*3/uL (ref 0.0–0.1)
Basophils Relative: 1 %
Eosinophils Absolute: 0 10*3/uL (ref 0.0–0.5)
Eosinophils Relative: 2 %
HCT: 28 % — ABNORMAL LOW (ref 39.0–52.0)
Hemoglobin: 9.6 g/dL — ABNORMAL LOW (ref 13.0–17.0)
Immature Granulocytes: 0 %
Lymphocytes Relative: 16 %
Lymphs Abs: 0.2 10*3/uL — ABNORMAL LOW (ref 0.7–4.0)
MCH: 30.7 pg (ref 26.0–34.0)
MCHC: 34.3 g/dL (ref 30.0–36.0)
MCV: 89.5 fL (ref 80.0–100.0)
Monocytes Absolute: 0.3 10*3/uL (ref 0.1–1.0)
Monocytes Relative: 20 %
Neutro Abs: 0.9 10*3/uL — ABNORMAL LOW (ref 1.7–7.7)
Neutrophils Relative %: 61 %
Platelet Count: 110 10*3/uL — ABNORMAL LOW (ref 150–400)
RBC: 3.13 MIL/uL — ABNORMAL LOW (ref 4.22–5.81)
RDW: 15.9 % — ABNORMAL HIGH (ref 11.5–15.5)
WBC Count: 1.5 10*3/uL — ABNORMAL LOW (ref 4.0–10.5)
nRBC: 0 % (ref 0.0–0.2)

## 2019-10-26 LAB — CMP (CANCER CENTER ONLY)
ALT: 15 U/L (ref 0–44)
AST: 12 U/L — ABNORMAL LOW (ref 15–41)
Albumin: 3.6 g/dL (ref 3.5–5.0)
Alkaline Phosphatase: 79 U/L (ref 38–126)
Anion gap: 6 (ref 5–15)
BUN: 17 mg/dL (ref 8–23)
CO2: 25 mmol/L (ref 22–32)
Calcium: 9.2 mg/dL (ref 8.9–10.3)
Chloride: 96 mmol/L — ABNORMAL LOW (ref 98–111)
Creatinine: 1.09 mg/dL (ref 0.61–1.24)
GFR, Est AFR Am: >60 mL/min (ref >60)
GFR, Estimated: >60 mL/min (ref >60)
Glucose, Bld: 97 mg/dL (ref 70–99)
Potassium: 4.4 mmol/L (ref 3.5–5.1)
Sodium: 127 mmol/L — ABNORMAL LOW (ref 135–145)
Total Bilirubin: 0.4 mg/dL (ref 0.3–1.2)
Total Protein: 5.8 g/dL — ABNORMAL LOW (ref 6.5–8.1)

## 2019-10-26 LAB — LACTATE DEHYDROGENASE: LDH: 139 U/L (ref 98–192)

## 2019-10-26 LAB — IRON AND TIBC
Iron: 63 ug/dL (ref 42–163)
Saturation Ratios: 21 % (ref 20–55)
TIBC: 295 ug/dL (ref 202–409)
UIBC: 232 ug/dL (ref 117–376)

## 2019-10-26 LAB — FERRITIN: Ferritin: 100 ng/mL (ref 24–336)

## 2019-10-26 MED ORDER — HEPARIN SOD (PORK) LOCK FLUSH 100 UNIT/ML IV SOLN
500.0000 [IU] | Freq: Once | INTRAVENOUS | Status: AC | PRN
  Administered 2019-10-26: 500 [IU]
  Filled 2019-10-26: qty 5

## 2019-10-26 MED ORDER — BELSOMRA 20 MG PO TABS: 20.0000 mg | ORAL_TABLET | Freq: Every day | ORAL | 2 refills | Status: DC

## 2019-10-26 MED ORDER — LORAZEPAM 1 MG PO TABS: 1.0000 mg | ORAL_TABLET | Freq: Four times a day (QID) | ORAL | 0 refills | Status: DC | PRN

## 2019-10-26 MED ORDER — SODIUM CHLORIDE 0.9 % IV SOLN
Freq: Once | INTRAVENOUS | Status: AC
  Filled 2019-10-26: qty 250

## 2019-10-26 MED ORDER — FILGRASTIM-SNDZ 480 MCG/0.8ML IJ SOSY
480.0000 ug | PREFILLED_SYRINGE | Freq: Once | INTRAMUSCULAR | Status: AC
  Administered 2019-10-26: 480 ug via SUBCUTANEOUS
  Filled 2019-10-26: qty 0.8

## 2019-10-26 MED ORDER — SODIUM CHLORIDE 0.9 % IV SOLN
20.0000 mg | Freq: Once | INTRAVENOUS | Status: AC
  Administered 2019-10-26: 20 mg via INTRAVENOUS
  Filled 2019-10-26: qty 20

## 2019-10-26 MED ORDER — SODIUM CHLORIDE 0.9% FLUSH
10.0000 mL | INTRAVENOUS | Status: DC | PRN
  Administered 2019-10-26: 10 mL
  Filled 2019-10-26: qty 10

## 2019-10-26 MED ORDER — PEGFILGRASTIM INJECTION 6 MG/0.6ML ~~LOC~~
PREFILLED_SYRINGE | SUBCUTANEOUS | Status: AC
  Filled 2019-10-26: qty 0.6

## 2019-10-26 MED FILL — BELSOMRA 20 MG TABLET: 20 | 30 days supply | Qty: 30 | Fill #0

## 2019-10-26 MED FILL — LORazepam 1 MG TABS: 1 | 15 days supply | Qty: 60 | Fill #0

## 2019-10-26 NOTE — Telephone Encounter (Signed)
Call from [place] at [facility] for pt. [place] [phone]  Zarxio with each treatment and PRN- Beginning on  10/26/19-03/07/20

## 2019-10-26 NOTE — Progress Notes (Signed)
Pharmacist Chemotherapy Monitoring - Follow Up Assessment    I verify that I have reviewed each item in the below checklist:  . Regimen for the patient is scheduled for the appropriate day and plan matches scheduled date. Marland Kitchen Appropriate non-routine labs are ordered dependent on drug ordered. . If applicable, additional medications reviewed and ordered per protocol based on lifetime cumulative doses and/or treatment regimen.   Plan for follow-up and/or issues identified: No . I-vent associated with next due treatment: No . MD and/or nursing notified: No  Taylor Elliott, Taylor Elliott 10/26/2019 7:43 AM

## 2019-10-26 NOTE — Patient Instructions (Signed)

## 2019-10-26 NOTE — Progress Notes (Signed)
Hematology and Oncology Follow Up Visit  JODECI ROARTY [id number] [birth year] 69 y.o. 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 Mcpheeters is back for follow-up.  He has had little bit of a tough time lately.  His white cell count is quite low with respect to his chemotherapy.  I still think he is able to get treated today.  He is very tired.  He does not have much energy.  I think a lot of this is from the low white cell.  As such, we probably will have to give him a dose of Neupogen to try to get his white cell count up.  I also worry about the SIADH.  We increase his dose of demeclocycline.  His sodium is coming up slowly.  He does have an element of dehydration.  We will go ahead and give him some IV fluids today.  He is not sleeping at all.  He says a when he goes to sleep he wakes up.  He then cannot go back to sleep.  He is on Restoril.  This apparently is no longer working.  I am not sure what we can try for him.  He does have a cough.  I think he is still smoking maybe just a little bit.  He has a pretty decent appetite.  He has had no nausea or vomiting.  There is been no issues with his bowels or bladder.  Overall, I would say his performance status is ECOG 2.     Medications:  Current Outpatient Medications:  .  amLODipine (NORVASC) 10 MG tablet, Take 10 mg by mouth daily., Disp: , Rfl:  .  aspirin EC 81 MG tablet, Take 81 mg by mouth daily., Disp: , Rfl:  .  calcium carbonate (TUMS - DOSED IN MG ELEMENTAL CALCIUM) 500 MG chewable  tablet, Chew 2 tablets by mouth daily as needed for indigestion or heartburn. , Disp: , Rfl:  .  COMBIVENT RESPIMAT 20-100 MCG/ACT AERS respimat, 2 (two) times daily. , Disp: , Rfl:  .  demeclocycline (DECLOMYCIN) 150 MG tablet, , Disp: , Rfl:  .  dexamethasone (DECADRON) 4 MG tablet, Take 2 tablets once a day on the day after chemotherapy and then take 2 tablets two times a day for 2 days. Take with food., Disp: 30 tablet, Rfl: 1 .  dronabinol (MARINOL) 5 MG capsule, Take 1 capsule (5 mg total) by mouth 2 (two) times daily before a meal., Disp: 60 capsule, Rfl: 1 .  EPINEPHrine 0.3 mg/0.3 mL IJ SOAJ injection, Inject 0.3 mg into the muscle once., Disp: , Rfl:  .  fenofibrate 160 MG tablet, TAKE 1 TABLET (160 MG TOTAL) BY MOUTH DAILY., Disp: 90 tablet, Rfl: 1 .  HYDROcodone-homatropine (HYCODAN) 5-1.5 MG/5ML syrup, Take 5 mLs by mouth every 4 (four) hours as needed for cough., Disp: 300 mL, Rfl: 0 .  lactulose (CHRONULAC) 10 GM/15ML solution, Take 15 mLs (10 g total) by mouth 2 (two) times daily as needed for mild constipation.,  Disp: 473 mL, Rfl: 1 .  lidocaine-prilocaine (EMLA) cream, Apply to affected area once, Disp: 30 g, Rfl: 3 .  loperamide (IMODIUM A-D) 2 MG tablet, Take 2 at diarrhea onset, then 1 every 2hr until 12hrs with no BM. May take 2 every 4hrs at night. If diarrhea recurs repeat., Disp: 100 tablet, Rfl: 1 .  LORazepam (ATIVAN) 1 MG tablet, TAKE 1 TABLET (1 MG TOTAL) BY MOUTH EVERY 6 (SIX) HOURS AS NEEDED FOR ANXIETY., Disp: 60 tablet, Rfl: 0 .  Multiple Vitamin (MULTIVITAMIN) tablet, Take 1 tablet by mouth daily., Disp: , Rfl:  .  ondansetron (ZOFRAN) 8 MG tablet, Take 1 tablet (8 mg total) by mouth 2 (two) times daily as needed. Start on the third day after chemotherapy., Disp: 30 tablet, Rfl: 1 .  prochlorperazine (COMPAZINE) 10 MG tablet, Take 1 tablet (10 mg total) by mouth every 6 (six) hours as needed (Nausea or vomiting)., Disp: 30 tablet, Rfl: 1 .  pyridoxine (B-6) 200 MG  tablet, Take 200 mg by mouth daily., Disp: , Rfl:  .  solifenacin (VESICARE) 10 MG tablet, Take 10 mg by mouth every evening., Disp: , Rfl:  .  temazepam (RESTORIL) 30 MG capsule, Take 1 capsule (30 mg total) by mouth at bedtime as needed for sleep., Disp: 30 capsule, Rfl: 2 .  triamcinolone (NASACORT) 55 MCG/ACT AERO nasal inhaler, , Disp: , Rfl:  .  UNABLE TO FIND, Med Name: Allergy shots., Disp: , Rfl:   Allergies:  Allergies  Allergen Reactions  . Bee Venom Anaphylaxis  . Clindamycin/Lincomycin Hives    Past Medical History, Surgical history, Social history, and Family History were reviewed and updated.  Review of Systems: Review of Systems  Constitutional: Negative.   HENT:  Negative.   Eyes: Negative.   Respiratory: Positive for cough.   Cardiovascular: Negative.   Gastrointestinal: Negative.   Endocrine: Negative.   Genitourinary: Negative.    Musculoskeletal: Positive for back pain, myalgias and neck pain.  Skin: Positive for itching and rash.  Neurological: Negative.   Hematological: Negative.   Psychiatric/Behavioral: Negative.     Physical Exam:  weight is 195 lb (88.5 kg). His temporal temperature is 97.7 F (36.5 C). His blood pressure is 132/67 and his pulse is 78. His respiration is 18 and oxygen saturation is 100%.   Wt Readings from Last 3 Encounters:  10/26/19 195 lb (88.5 kg)  10/22/19 196 lb 8 oz (89.1 kg)  09/04/19 191 lb (86.6 kg)    Physical Exam Vitals reviewed.  HENT:     Head: Normocephalic and atraumatic.  Eyes:     Pupils: Pupils are equal, round, and reactive to light.  Cardiovascular:     Rate and Rhythm: Normal rate and regular rhythm.     Heart sounds: Normal heart sounds.  Pulmonary:     Effort: Pulmonary effort is normal.     Breath sounds: Normal breath sounds.  Abdominal:     General: Bowel sounds are normal.     Palpations: Abdomen is soft.     Comments: Abdominal exam shows a slightly obese abdomen.  He does have an  umbilical hernia.  I really cannot palpate his liver at this point.  There is no fluid wave.  There is no inguinal adenopathy.  There is no splenomegaly.    Musculoskeletal:        General: No tenderness or deformity. Normal range of motion.     Cervical back: Normal range of motion.  Lymphadenopathy:  Cervical: No cervical adenopathy.  Skin:    General: Skin is warm and dry.     Findings: No erythema or rash.  Neurological:     Mental Status: He is alert and oriented to person, place, and time.  Psychiatric:        Behavior: Behavior normal.        Thought Content: Thought content normal.        Judgment: Judgment normal.      Lab Results  Component Value Date   WBC 1.5 (L) 10/26/2019   HGB 9.6 (L) 10/26/2019   HCT 28.0 (L) 10/26/2019   MCV 89.5 10/26/2019   PLT 110 (L) 10/26/2019     Chemistry      Component Value Date/Time   NA 127 (L) 10/26/2019 0835   NA 135 (L) 07/12/2015 0813   K 4.4 10/26/2019 0835   K 4.5 07/12/2015 0813   CL 96 (L) 10/26/2019 0835   CO2 25 10/26/2019 0835   CO2 24 07/12/2015 0813   BUN 17 10/26/2019 0835   BUN 15.7 07/12/2015 0813   CREATININE 1.09 10/26/2019 0835   CREATININE 1.6 (H) 07/12/2015 0813      Component Value Date/Time   CALCIUM 9.2 10/26/2019 0835   CALCIUM 10.1 07/12/2015 0813   ALKPHOS 79 10/26/2019 0835   ALKPHOS 115 07/12/2015 0813   AST 12 (L) 10/26/2019 0835   AST 22 07/12/2015 0813   ALT 15 10/26/2019 0835   ALT 24 07/12/2015 0813   BILITOT 0.4 10/26/2019 0835   BILITOT 0.46 07/12/2015 0813       Impression and Plan: Mr. Jarnigan is a 69 year old white male.  He has a past history of renal cell carcinoma and also prostate cancer.  Now, he has a third malignancy.  This is metastatic small cell lung cancer.  It is recurrent.  Hopefully, we are seeing that he is responding.  We will have to hold on his third cycle of chemotherapy for a week.  He will get IV fluids today.  He will get Neupogen today.  We  will plan to see him back next week.  Hopefully, his white cell count will be a little better and his sodium will also be better so we can do treatment.               Volanda Napoleon, MD 3/29/20219:23 AM

## 2019-10-27 ENCOUNTER — Encounter: Payer: Self-pay | Admitting: *Deleted

## 2019-10-29 MED FILL — TEMAZEPAM 30 MG CAPSULE: 30 | 30 days supply | Qty: 30 | Fill #2

## 2019-11-02 ENCOUNTER — Inpatient Hospital Stay: Payer: 59

## 2019-11-02 ENCOUNTER — Inpatient Hospital Stay: Payer: 59 | Attending: Hematology & Oncology

## 2019-11-02 ENCOUNTER — Other Ambulatory Visit: Payer: Self-pay

## 2019-11-02 ENCOUNTER — Encounter: Payer: Self-pay | Admitting: Hematology & Oncology

## 2019-11-02 DIAGNOSIS — C3491 Malignant neoplasm of unspecified part of right bronchus or lung: Secondary | ICD-10-CM

## 2019-11-02 DIAGNOSIS — Z5111 Encounter for antineoplastic chemotherapy: Secondary | ICD-10-CM | POA: Insufficient documentation

## 2019-11-02 DIAGNOSIS — Z7982 Long term (current) use of aspirin: Secondary | ICD-10-CM | POA: Diagnosis not present

## 2019-11-02 DIAGNOSIS — Z85528 Personal history of other malignant neoplasm of kidney: Secondary | ICD-10-CM | POA: Insufficient documentation

## 2019-11-02 DIAGNOSIS — Z79899 Other long term (current) drug therapy: Secondary | ICD-10-CM | POA: Insufficient documentation

## 2019-11-02 DIAGNOSIS — E222 Syndrome of inappropriate secretion of antidiuretic hormone: Secondary | ICD-10-CM | POA: Diagnosis not present

## 2019-11-02 DIAGNOSIS — Z5189 Encounter for other specified aftercare: Secondary | ICD-10-CM | POA: Insufficient documentation

## 2019-11-02 DIAGNOSIS — Z8546 Personal history of malignant neoplasm of prostate: Secondary | ICD-10-CM | POA: Diagnosis not present

## 2019-11-02 DIAGNOSIS — Z7952 Long term (current) use of systemic steroids: Secondary | ICD-10-CM | POA: Insufficient documentation

## 2019-11-02 LAB — CBC WITH DIFFERENTIAL (CANCER CENTER ONLY)
Abs Immature Granulocytes: 0.05 10*3/uL (ref 0.00–0.07)
Basophils Absolute: 0 10*3/uL (ref 0.0–0.1)
Basophils Relative: 0 %
Eosinophils Absolute: 0.1 10*3/uL (ref 0.0–0.5)
Eosinophils Relative: 3 %
HCT: 31 % — ABNORMAL LOW (ref 39.0–52.0)
Hemoglobin: 10.2 g/dL — ABNORMAL LOW (ref 13.0–17.0)
Immature Granulocytes: 2 %
Lymphocytes Relative: 13 %
Lymphs Abs: 0.3 10*3/uL — ABNORMAL LOW (ref 0.7–4.0)
MCH: 30.7 pg (ref 26.0–34.0)
MCHC: 32.9 g/dL (ref 30.0–36.0)
MCV: 93.4 fL (ref 80.0–100.0)
Monocytes Absolute: 0.4 10*3/uL (ref 0.1–1.0)
Monocytes Relative: 18 %
Neutro Abs: 1.5 10*3/uL — ABNORMAL LOW (ref 1.7–7.7)
Neutrophils Relative %: 64 %
Platelet Count: 107 10*3/uL — ABNORMAL LOW (ref 150–400)
RBC: 3.32 MIL/uL — ABNORMAL LOW (ref 4.22–5.81)
RDW: 17.4 % — ABNORMAL HIGH (ref 11.5–15.5)
WBC Count: 2.3 10*3/uL — ABNORMAL LOW (ref 4.0–10.5)
nRBC: 0 % (ref 0.0–0.2)

## 2019-11-02 LAB — CMP (CANCER CENTER ONLY)
ALT: 18 U/L (ref 0–44)
AST: 13 U/L — ABNORMAL LOW (ref 15–41)
Albumin: 3.4 g/dL — ABNORMAL LOW (ref 3.5–5.0)
Alkaline Phosphatase: 100 U/L (ref 38–126)
Anion gap: 7 (ref 5–15)
BUN: 25 mg/dL — ABNORMAL HIGH (ref 8–23)
CO2: 26 mmol/L (ref 22–32)
Calcium: 9.1 mg/dL (ref 8.9–10.3)
Chloride: 106 mmol/L (ref 98–111)
Creatinine: 1.25 mg/dL — ABNORMAL HIGH (ref 0.61–1.24)
GFR, Est AFR Am: >60 mL/min (ref >60)
GFR, Estimated: 59 mL/min — ABNORMAL LOW (ref >60)
Glucose, Bld: 96 mg/dL (ref 70–99)
Potassium: 4.2 mmol/L (ref 3.5–5.1)
Sodium: 139 mmol/L (ref 135–145)
Total Bilirubin: 0.3 mg/dL (ref 0.3–1.2)
Total Protein: 5.5 g/dL — ABNORMAL LOW (ref 6.5–8.1)

## 2019-11-02 MED ORDER — SODIUM CHLORIDE 0.9% FLUSH
10.0000 mL | INTRAVENOUS | Status: DC | PRN
  Administered 2019-11-02: 10 mL
  Filled 2019-11-02: qty 10

## 2019-11-02 MED ORDER — POTASSIUM CHLORIDE 2 MEQ/ML IV SOLN
Freq: Once | INTRAVENOUS | Status: AC
  Filled 2019-11-02: qty 10

## 2019-11-02 MED ORDER — SODIUM CHLORIDE 0.9 % IV SOLN
10.0000 mg | Freq: Once | INTRAVENOUS | Status: AC
  Administered 2019-11-02: 10 mg via INTRAVENOUS
  Filled 2019-11-02: qty 1

## 2019-11-02 MED ORDER — SODIUM CHLORIDE 0.9 % IV SOLN
Freq: Once | INTRAVENOUS | Status: AC
  Filled 2019-11-02: qty 250

## 2019-11-02 MED ORDER — PALONOSETRON HCL INJECTION 0.25 MG/5ML
0.2500 mg | Freq: Once | INTRAVENOUS | Status: AC
  Administered 2019-11-02: 0.25 mg via INTRAVENOUS

## 2019-11-02 MED ORDER — ATROPINE SULFATE 1 MG/ML IJ SOLN
0.5000 mg | Freq: Once | INTRAMUSCULAR | Status: AC | PRN
  Administered 2019-11-02: 0.5 mg via INTRAVENOUS

## 2019-11-02 MED ORDER — SODIUM CHLORIDE 0.9 % IV SOLN
52.0000 mg/m2 | Freq: Once | INTRAVENOUS | Status: AC
  Administered 2019-11-02: 100 mg via INTRAVENOUS
  Filled 2019-11-02: qty 5

## 2019-11-02 MED ORDER — ATROPINE SULFATE 1 MG/ML IJ SOLN
INTRAMUSCULAR | Status: AC
  Filled 2019-11-02: qty 1

## 2019-11-02 MED ORDER — SODIUM CHLORIDE 0.9 % IV SOLN
150.0000 mg | Freq: Once | INTRAVENOUS | Status: AC
  Administered 2019-11-02: 150 mg via INTRAVENOUS
  Filled 2019-11-02: qty 150

## 2019-11-02 MED ORDER — PALONOSETRON HCL INJECTION 0.25 MG/5ML
INTRAVENOUS | Status: AC
  Filled 2019-11-02: qty 5

## 2019-11-02 MED ORDER — HEPARIN SOD (PORK) LOCK FLUSH 100 UNIT/ML IV SOLN
500.0000 [IU] | Freq: Once | INTRAVENOUS | Status: AC | PRN
  Administered 2019-11-02: 500 [IU]
  Filled 2019-11-02: qty 5

## 2019-11-02 MED ORDER — SODIUM CHLORIDE 0.9 % IV SOLN
30.0000 mg/m2 | Freq: Once | INTRAVENOUS | Status: AC
  Administered 2019-11-02: 61 mg via INTRAVENOUS
  Filled 2019-11-02: qty 61

## 2019-11-02 NOTE — Progress Notes (Signed)
Urine output 350cc. Treatment realeased

## 2019-11-02 NOTE — Patient Instructions (Signed)
[facility] Discharge Instructions for Patients Receiving Chemotherapy  Today you received the following chemotherapy agents Cisplatin, Irinotecan  To help prevent nausea and vomiting after your treatment, we encourage you to take your nausea medication as prescribed by MD.   If you develop nausea and vomiting that is not controlled by your nausea medication, call the clinic.   BELOW ARE SYMPTOMS THAT SHOULD BE REPORTED IMMEDIATELY:  *FEVER GREATER THAN 100.5 F  *CHILLS WITH OR WITHOUT FEVER  NAUSEA AND VOMITING THAT IS NOT CONTROLLED WITH YOUR NAUSEA MEDICATION  *UNUSUAL SHORTNESS OF BREATH  *UNUSUAL BRUISING OR BLEEDING  TENDERNESS IN MOUTH AND THROAT WITH OR WITHOUT PRESENCE OF ULCERS  *URINARY PROBLEMS  *BOWEL PROBLEMS  UNUSUAL RASH Items with * indicate a potential emergency and should be followed up as soon as possible.  Feel free to call the clinic should you have any questions or concerns. The clinic phone number is [phone].  Please show the [facility] at check-in to the Emergency Department and triage nurse.

## 2019-11-03 ENCOUNTER — Other Ambulatory Visit: Payer: Self-pay | Admitting: Hematology & Oncology

## 2019-11-03 ENCOUNTER — Encounter (INDEPENDENT_AMBULATORY_CARE_PROVIDER_SITE_OTHER): Payer: Self-pay | Admitting: Otolaryngology

## 2019-11-03 ENCOUNTER — Ambulatory Visit (INDEPENDENT_AMBULATORY_CARE_PROVIDER_SITE_OTHER): Payer: 59 | Admitting: Otolaryngology

## 2019-11-03 VITALS — Temp 98.6°F

## 2019-11-03 DIAGNOSIS — H66002 Acute suppurative otitis media without spontaneous rupture of ear drum, left ear: Secondary | ICD-10-CM | POA: Diagnosis not present

## 2019-11-03 MED FILL — CIPROFLOXACIN-DEXAMETHASONE: 0.3-0.1 | 15 days supply | Qty: 8 | Fill #0

## 2019-11-03 NOTE — Progress Notes (Signed)
Pharmacist Chemotherapy Monitoring - Follow Up Assessment    I verify that I have reviewed each item in the below checklist:  . Regimen for the patient is scheduled for the appropriate day and plan matches scheduled date. Marland Kitchen Appropriate non-routine labs are ordered dependent on drug ordered. . If applicable, additional medications reviewed and ordered per protocol based on lifetime cumulative doses and/or treatment regimen.   Plan for follow-up and/or issues identified: No . I-vent associated with next due treatment: No . MD and/or nursing notified: No  Charonda Hefter, [name] 11/03/2019 7:43 AM
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
## 2019-11-04 DIAGNOSIS — T63461D Toxic effect of venom of wasps, accidental (unintentional), subsequent encounter: Secondary | ICD-10-CM | POA: Diagnosis not present

## 2019-11-04 DIAGNOSIS — T63451D Toxic effect of venom of hornets, accidental (unintentional), subsequent encounter: Secondary | ICD-10-CM | POA: Diagnosis not present

## 2019-11-04 DIAGNOSIS — Z760 Encounter for issue of repeat prescription: Secondary | ICD-10-CM | POA: Diagnosis not present

## 2019-11-08 ENCOUNTER — Other Ambulatory Visit: Payer: Self-pay | Admitting: Hematology & Oncology

## 2019-11-08 DIAGNOSIS — R05 Cough: Secondary | ICD-10-CM

## 2019-11-08 DIAGNOSIS — R059 Cough, unspecified: Secondary | ICD-10-CM

## 2019-11-08 DIAGNOSIS — C3491 Malignant neoplasm of unspecified part of right bronchus or lung: Secondary | ICD-10-CM

## 2019-11-09 ENCOUNTER — Other Ambulatory Visit: Payer: Self-pay | Admitting: *Deleted

## 2019-11-09 DIAGNOSIS — C3491 Malignant neoplasm of unspecified part of right bronchus or lung: Secondary | ICD-10-CM

## 2019-11-09 DIAGNOSIS — R05 Cough: Secondary | ICD-10-CM

## 2019-11-09 DIAGNOSIS — R059 Cough, unspecified: Secondary | ICD-10-CM

## 2019-11-09 MED ORDER — HYDROCODONE-HOMATROPINE 5-1.5 MG/5ML PO SYRP: 5.0000 mL | ORAL_SOLUTION | ORAL | 0 refills | Status: DC | PRN

## 2019-11-09 MED FILL — HYDROCODONE-HOMATROPINE SYR: 5-1.5 | 10 days supply | Qty: 300 | Fill #0

## 2019-11-10 ENCOUNTER — Encounter: Payer: Self-pay | Admitting: *Deleted

## 2019-11-10 ENCOUNTER — Inpatient Hospital Stay: Payer: 59

## 2019-11-10 ENCOUNTER — Telehealth: Payer: Self-pay | Admitting: Hematology & Oncology

## 2019-11-10 ENCOUNTER — Other Ambulatory Visit: Payer: Self-pay

## 2019-11-10 ENCOUNTER — Inpatient Hospital Stay (HOSPITAL_BASED_OUTPATIENT_CLINIC_OR_DEPARTMENT_OTHER): Payer: 59 | Admitting: Hematology & Oncology

## 2019-11-10 ENCOUNTER — Encounter: Payer: Self-pay | Admitting: Pharmacist

## 2019-11-10 DIAGNOSIS — C3491 Malignant neoplasm of unspecified part of right bronchus or lung: Secondary | ICD-10-CM

## 2019-11-10 DIAGNOSIS — Z85528 Personal history of other malignant neoplasm of kidney: Secondary | ICD-10-CM | POA: Diagnosis not present

## 2019-11-10 DIAGNOSIS — Z7952 Long term (current) use of systemic steroids: Secondary | ICD-10-CM | POA: Diagnosis not present

## 2019-11-10 DIAGNOSIS — Z7982 Long term (current) use of aspirin: Secondary | ICD-10-CM | POA: Diagnosis not present

## 2019-11-10 DIAGNOSIS — Z5111 Encounter for antineoplastic chemotherapy: Secondary | ICD-10-CM | POA: Diagnosis not present

## 2019-11-10 DIAGNOSIS — Z79899 Other long term (current) drug therapy: Secondary | ICD-10-CM | POA: Diagnosis not present

## 2019-11-10 DIAGNOSIS — L03119 Cellulitis of unspecified part of limb: Secondary | ICD-10-CM

## 2019-11-10 DIAGNOSIS — E222 Syndrome of inappropriate secretion of antidiuretic hormone: Secondary | ICD-10-CM | POA: Diagnosis not present

## 2019-11-10 DIAGNOSIS — Z5189 Encounter for other specified aftercare: Secondary | ICD-10-CM | POA: Diagnosis not present

## 2019-11-10 DIAGNOSIS — Z8546 Personal history of malignant neoplasm of prostate: Secondary | ICD-10-CM | POA: Diagnosis not present

## 2019-11-10 LAB — CBC WITH DIFFERENTIAL (CANCER CENTER ONLY)
Abs Immature Granulocytes: 0.03 10*3/uL (ref 0.00–0.07)
Basophils Absolute: 0 10*3/uL (ref 0.0–0.1)
Basophils Relative: 1 %
Eosinophils Absolute: 0 10*3/uL (ref 0.0–0.5)
Eosinophils Relative: 1 %
HCT: 28.5 % — ABNORMAL LOW (ref 39.0–52.0)
Hemoglobin: 9.5 g/dL — ABNORMAL LOW (ref 13.0–17.0)
Immature Granulocytes: 2 %
Lymphocytes Relative: 16 %
Lymphs Abs: 0.3 10*3/uL — ABNORMAL LOW (ref 0.7–4.0)
MCH: 31 pg (ref 26.0–34.0)
MCHC: 33.3 g/dL (ref 30.0–36.0)
MCV: 93.1 fL (ref 80.0–100.0)
Monocytes Absolute: 0.3 10*3/uL (ref 0.1–1.0)
Monocytes Relative: 15 %
Neutro Abs: 1.4 10*3/uL — ABNORMAL LOW (ref 1.7–7.7)
Neutrophils Relative %: 65 %
Platelet Count: 171 10*3/uL (ref 150–400)
RBC: 3.06 MIL/uL — ABNORMAL LOW (ref 4.22–5.81)
RDW: 16.6 % — ABNORMAL HIGH (ref 11.5–15.5)
WBC Count: 2.1 10*3/uL — ABNORMAL LOW (ref 4.0–10.5)
nRBC: 0 % (ref 0.0–0.2)

## 2019-11-10 LAB — CMP (CANCER CENTER ONLY)
ALT: 15 U/L (ref 0–44)
AST: 13 U/L — ABNORMAL LOW (ref 15–41)
Albumin: 3.3 g/dL — ABNORMAL LOW (ref 3.5–5.0)
Alkaline Phosphatase: 82 U/L (ref 38–126)
Anion gap: 6 (ref 5–15)
BUN: 26 mg/dL — ABNORMAL HIGH (ref 8–23)
CO2: 27 mmol/L (ref 22–32)
Calcium: 9.2 mg/dL (ref 8.9–10.3)
Chloride: 108 mmol/L (ref 98–111)
Creatinine: 1.16 mg/dL (ref 0.61–1.24)
GFR, Est AFR Am: >60 mL/min (ref >60)
GFR, Estimated: >60 mL/min (ref >60)
Glucose, Bld: 104 mg/dL — ABNORMAL HIGH (ref 70–99)
Potassium: 4.2 mmol/L (ref 3.5–5.1)
Sodium: 141 mmol/L (ref 135–145)
Total Bilirubin: 0.3 mg/dL (ref 0.3–1.2)
Total Protein: 5.4 g/dL — ABNORMAL LOW (ref 6.5–8.1)

## 2019-11-10 LAB — LACTATE DEHYDROGENASE: LDH: 162 U/L (ref 98–192)

## 2019-11-10 MED ORDER — ATROPINE SULFATE 1 MG/ML IJ SOLN
0.5000 mg | Freq: Once | INTRAMUSCULAR | Status: AC | PRN
  Administered 2019-11-10: 0.5 mg via INTRAVENOUS

## 2019-11-10 MED ORDER — PEGFILGRASTIM 6 MG/0.6ML ~~LOC~~ PSKT
PREFILLED_SYRINGE | SUBCUTANEOUS | Status: AC
  Filled 2019-11-10: qty 0.6

## 2019-11-10 MED ORDER — COMBIVENT RESPIMAT 20-100 MCG/ACT IN AERS: 1.0000 | INHALATION_SPRAY | Freq: Two times a day (BID) | RESPIRATORY_TRACT | 4 refills | Status: DC

## 2019-11-10 MED ORDER — SODIUM CHLORIDE 0.9 % IV SOLN
52.0000 mg/m2 | Freq: Once | INTRAVENOUS | Status: AC
  Administered 2019-11-10: 100 mg via INTRAVENOUS
  Filled 2019-11-10: qty 5

## 2019-11-10 MED ORDER — PALONOSETRON HCL INJECTION 0.25 MG/5ML
INTRAVENOUS | Status: AC
  Filled 2019-11-10: qty 5

## 2019-11-10 MED ORDER — SODIUM CHLORIDE 0.9 % IV SOLN
150.0000 mg | Freq: Once | INTRAVENOUS | Status: AC
  Administered 2019-11-10: 150 mg via INTRAVENOUS
  Filled 2019-11-10: qty 150

## 2019-11-10 MED ORDER — SODIUM CHLORIDE 0.9 % IV SOLN
Freq: Once | INTRAVENOUS | Status: AC
  Filled 2019-11-10: qty 250

## 2019-11-10 MED ORDER — PALONOSETRON HCL INJECTION 0.25 MG/5ML
0.2500 mg | Freq: Once | INTRAVENOUS | Status: AC
  Administered 2019-11-10: 0.25 mg via INTRAVENOUS

## 2019-11-10 MED ORDER — SODIUM CHLORIDE 0.9 % IV SOLN
30.0000 mg/m2 | Freq: Once | INTRAVENOUS | Status: AC
  Administered 2019-11-10: 61 mg via INTRAVENOUS
  Filled 2019-11-10: qty 61

## 2019-11-10 MED ORDER — SODIUM CHLORIDE 0.9 % IV SOLN
10.0000 mg | Freq: Once | INTRAVENOUS | Status: AC
  Administered 2019-11-10: 10 mg via INTRAVENOUS
  Filled 2019-11-10: qty 10

## 2019-11-10 MED ORDER — SODIUM CHLORIDE 0.9% FLUSH
10.0000 mL | INTRAVENOUS | Status: DC | PRN
  Administered 2019-11-10: 10 mL
  Filled 2019-11-10: qty 10

## 2019-11-10 MED ORDER — ATROPINE SULFATE 1 MG/ML IJ SOLN
INTRAMUSCULAR | Status: AC
  Filled 2019-11-10: qty 1

## 2019-11-10 MED ORDER — HEPARIN SOD (PORK) LOCK FLUSH 100 UNIT/ML IV SOLN
500.0000 [IU] | Freq: Once | INTRAVENOUS | Status: AC | PRN
  Administered 2019-11-10: 500 [IU]
  Filled 2019-11-10: qty 5

## 2019-11-10 MED ORDER — PEGFILGRASTIM 6 MG/0.6ML ~~LOC~~ PSKT
6.0000 mg | PREFILLED_SYRINGE | Freq: Once | SUBCUTANEOUS | Status: AC
  Administered 2019-11-10: 6 mg via SUBCUTANEOUS

## 2019-11-10 MED ORDER — POTASSIUM CHLORIDE 2 MEQ/ML IV SOLN
Freq: Once | INTRAVENOUS | Status: AC
  Filled 2019-11-10: qty 10

## 2019-11-10 MED FILL — COMBIVENT RESPIMAT INHAL SP: 20-100 | 60 days supply | Qty: 4 | Fill #0

## 2019-11-10 NOTE — Progress Notes (Unsigned)
NPR required for [facility] per Otilio Carpen, Financial Advocate.

## 2019-11-10 NOTE — Telephone Encounter (Signed)
Appointments scheduled calendar printed per 4/13 los 

## 2019-11-10 NOTE — Patient Instructions (Signed)
Implanted Port Insertion, Care After °This sheet gives you information about how to care for yourself after your procedure. Your health care provider may also give you more specific instructions. If you have problems or questions, contact your health care provider. °What can I expect after the procedure? °After the procedure, it is common to have: °· Discomfort at the port insertion site. °· Bruising on the skin over the port. This should improve over 3-4 days. °Follow these instructions at home: °Port care °· After your port is placed, you will get a manufacturer's information card. The card has information about your port. Keep this card with you at all times. °· Take care of the port as told by your health care provider. Ask your health care provider if you or a family member can get training for taking care of the port at home. A home health care nurse may also take care of the port. °· Make sure to remember what type of port you have. °Incision care ° °  ° °· Follow instructions from your health care provider about how to take care of your port insertion site. Make sure you: °? Wash your hands with soap and water before and after you change your bandage (dressing). If soap and water are not available, use hand sanitizer. °? Change your dressing as told by your health care provider. °? Leave stitches (sutures), skin glue, or adhesive strips in place. These skin closures may need to stay in place for 2 weeks or longer. If adhesive strip edges start to loosen and curl up, you may trim the loose edges. Do not remove adhesive strips completely unless your health care provider tells you to do that. °· Check your port insertion site every day for signs of infection. Check for: °? Redness, swelling, or pain. °? Fluid or blood. °? Warmth. °? Pus or a bad smell. °Activity °· Return to your normal activities as told by your health care provider. Ask your health care provider what activities are safe for you. °· Do not  lift anything that is heavier than 10 lb (4.5 kg), or the limit that you are told, until your health care provider says that it is safe. °General instructions °· Take over-the-counter and prescription medicines only as told by your health care provider. °· Do not take baths, swim, or use a hot tub until your health care provider approves. Ask your health care provider if you may take showers. You may only be allowed to take sponge baths. °· Do not drive for 24 hours if you were given a sedative during your procedure. °· Wear a medical alert bracelet in case of an emergency. This will tell any health care providers that you have a port. °· Keep all follow-up visits as told by your health care provider. This is important. °Contact a health care provider if: °· You cannot flush your port with saline as directed, or you cannot draw blood from the port. °· You have a fever or chills. °· You have redness, swelling, or pain around your port insertion site. °· You have fluid or blood coming from your port insertion site. °· Your port insertion site feels warm to the touch. °· You have pus or a bad smell coming from the port insertion site. °Get help right away if: °· You have chest pain or shortness of breath. °· You have bleeding from your port that you cannot control. °Summary °· Take care of the port as told by your health   care provider. Keep the manufacturer's information card with you at all times. °· Change your dressing as told by your health care provider. °· Contact a health care provider if you have a fever or chills or if you have redness, swelling, or pain around your port insertion site. °· Keep all follow-up visits as told by your health care provider. °This information is not intended to replace advice given to you by your health care provider. Make sure you discuss any questions you have with your health care provider. °Document Revised: 02/11/2018 Document Reviewed: 02/11/2018 °Elsevier Patient Education ©  2020 Elsevier Inc. ° °

## 2019-11-10 NOTE — Progress Notes (Signed)
Hematology and Oncology Follow Up Visit  Taylor Elliott [id number] Dec 20, 1950 69 y.o. 11/10/2019   Principle Diagnosis:   Extensive stage small cell lung cancer  SIADH secondary to small cell lung cancer  Current Therapy:           Carboplatinum/etoposide/Tecentriq-cycle #6  Atezolizumab 1200 mg IV q 3 week -- maintenance -  Start 01/21/2019  XRT for CNS mets -- completed on 03/27/2019  Lurbinectedin -- s/p cycle #4-- started on 05/07/2019 -- d/c on 09/03/2019 due to progression  CDDP/Irinotecan -- s/p cycle #3- started on 09/11/2019     Interim History:  Taylor Elliott is back for follow-up.  He actually looks quite good.  He feels a little better.  I think that the fact that his sodium is 628 is certainly helping.  I think he is not dehydrated.  He is on the demeclocycline which I am sure is helping.  I am not going to change the dose of the the meclocycline.  There is been no problems with fever.  He is still smoking.  He probably smokes 6 cigarettes a day.  He is trying to cut back.  There has been no problems with nausea or vomiting.  He has had no diarrhea.  There is been no issues with bleeding or bruising.  He is does have some balance issues.  He goes for the MRI of the brain in early May.  Today, is day 8 of cycle 3 of treatment.  We will set him up with a PET scan after this cycle to see how everything is going.  Overall, I would say his performance status is ECOG 1.     Medications:  Current Outpatient Medications:  .  amLODipine (NORVASC) 10 MG tablet, Take 10 mg by mouth daily., Disp: , Rfl:  .  aspirin EC 81 MG tablet, Take 81 mg by mouth daily., Disp: , Rfl:  .  calcium carbonate (TUMS - DOSED IN MG ELEMENTAL CALCIUM) 500 MG chewable tablet, Chew 2 tablets by mouth daily as needed for indigestion or heartburn. , Disp: , Rfl:  .  ciprofloxacin-dexamethasone (CIPRODEX) OTIC suspension, Place 5 drops into both ears as needed., Disp: , Rfl:  .  COMBIVENT RESPIMAT  20-100 MCG/ACT AERS respimat, 2 (two) times daily. , Disp: , Rfl:  .  demeclocycline (DECLOMYCIN) 150 MG tablet, , Disp: , Rfl:  .  dexamethasone (DECADRON) 4 MG tablet, Take 2 tablets once a day on the day after chemotherapy and then take 2 tablets two times a day for 2 days. Take with food., Disp: 30 tablet, Rfl: 1 .  dronabinol (MARINOL) 5 MG capsule, Take 1 capsule (5 mg total) by mouth 2 (two) times daily before a meal., Disp: 60 capsule, Rfl: 1 .  EPINEPHrine 0.3 mg/0.3 mL IJ SOAJ injection, Inject 0.3 mg into the muscle once., Disp: , Rfl:  .  fenofibrate 160 MG tablet, TAKE 1 TABLET (160 MG TOTAL) BY MOUTH DAILY., Disp: 90 tablet, Rfl: 1 .  HYDROcodone-homatropine (HYCODAN) 5-1.5 MG/5ML syrup, Take 5 mLs by mouth every 4 (four) hours as needed for cough., Disp: 300 mL, Rfl: 0 .  lactulose (CHRONULAC) 10 GM/15ML solution, Take 15 mLs (10 g total) by mouth 2 (two) times daily as needed for mild constipation., Disp: 473 mL, Rfl: 1 .  lidocaine-prilocaine (EMLA) cream, Apply to affected area once, Disp: 30 g, Rfl: 3 .  loperamide (IMODIUM A-D) 2 MG tablet, Take 2 at diarrhea onset, then 1 every 2hr until 12hrs  with no BM. May take 2 every 4hrs at night. If diarrhea recurs repeat., Disp: 100 tablet, Rfl: 1 .  LORazepam (ATIVAN) 1 MG tablet, Take 1 tablet (1 mg total) by mouth every 6 (six) hours as needed for anxiety., Disp: 60 tablet, Rfl: 0 .  Multiple Vitamin (MULTIVITAMIN) tablet, Take 1 tablet by mouth daily., Disp: , Rfl:  .  ondansetron (ZOFRAN) 8 MG tablet, Take 1 tablet (8 mg total) by mouth 2 (two) times daily as needed. Start on the third day after chemotherapy., Disp: 30 tablet, Rfl: 1 .  prochlorperazine (COMPAZINE) 10 MG tablet, Take 1 tablet (10 mg total) by mouth every 6 (six) hours as needed (Nausea or vomiting)., Disp: 30 tablet, Rfl: 1 .  pyridoxine (B-6) 200 MG tablet, Take 200 mg by mouth daily., Disp: , Rfl:  .  solifenacin (VESICARE) 10 MG tablet, Take 10 mg by mouth every  evening., Disp: , Rfl:  .  temazepam (RESTORIL) 30 MG capsule, Take 30 mg by mouth at bedtime as needed., Disp: , Rfl:  .  triamcinolone (NASACORT) 55 MCG/ACT AERO nasal inhaler, , Disp: , Rfl:  .  UNABLE TO FIND, Med Name: Allergy shots., Disp: , Rfl:   Allergies:  Allergies  Allergen Reactions  . Bee Venom Anaphylaxis  . Clindamycin/Lincomycin Hives    Past Medical History, Surgical history, Social history, and Family History were reviewed and updated.  Review of Systems: Review of Systems  Constitutional: Negative.   HENT:  Negative.   Eyes: Negative.   Respiratory: Positive for cough.   Cardiovascular: Negative.   Gastrointestinal: Negative.   Endocrine: Negative.   Genitourinary: Negative.    Musculoskeletal: Positive for back pain, myalgias and neck pain.  Skin: Positive for itching and rash.  Neurological: Negative.   Hematological: Negative.   Psychiatric/Behavioral: Negative.     Physical Exam:  vitals were not taken for this visit.   Wt Readings from Last 3 Encounters:  11/10/19 190 lb (86.2 kg)  10/26/19 195 lb (88.5 kg)  10/22/19 196 lb 8 oz (89.1 kg)    Physical Exam Vitals reviewed.  HENT:     Head: Normocephalic and atraumatic.  Eyes:     Pupils: Pupils are equal, round, and reactive to light.  Cardiovascular:     Rate and Rhythm: Normal rate and regular rhythm.     Heart sounds: Normal heart sounds.  Pulmonary:     Effort: Pulmonary effort is normal.     Breath sounds: Normal breath sounds.  Abdominal:     General: Bowel sounds are normal.     Palpations: Abdomen is soft.     Comments: Abdominal exam shows a slightly obese abdomen.  He does have an umbilical hernia.  I really cannot palpate his liver at this point.  There is no fluid wave.  There is no inguinal adenopathy.  There is no splenomegaly.    Musculoskeletal:        General: No tenderness or deformity. Normal range of motion.     Cervical back: Normal range of motion.   Lymphadenopathy:     Cervical: No cervical adenopathy.  Skin:    General: Skin is warm and dry.     Findings: No erythema or rash.  Neurological:     Mental Status: He is alert and oriented to person, place, and time.  Psychiatric:        Behavior: Behavior normal.        Thought Content: Thought content normal.  Judgment: Judgment normal.      Lab Results  Component Value Date   WBC 2.1 (L) 11/10/2019   HGB 9.5 (L) 11/10/2019   HCT 28.5 (L) 11/10/2019   MCV 93.1 11/10/2019   PLT 171 11/10/2019     Chemistry      Component Value Date/Time   NA 139 11/02/2019 0823   NA 135 (L) 07/12/2015 0813   K 4.2 11/02/2019 0823   K 4.5 07/12/2015 0813   CL 106 11/02/2019 0823   CO2 26 11/02/2019 0823   CO2 24 07/12/2015 0813   BUN 25 (H) 11/02/2019 0823   BUN 15.7 07/12/2015 0813   CREATININE 1.25 (H) 11/02/2019 0823   CREATININE 1.6 (H) 07/12/2015 0813      Component Value Date/Time   CALCIUM 9.1 11/02/2019 0823   CALCIUM 10.1 07/12/2015 0813   ALKPHOS 100 11/02/2019 0823   ALKPHOS 115 07/12/2015 0813   AST 13 (L) 11/02/2019 0823   AST 22 07/12/2015 0813   ALT 18 11/02/2019 0823   ALT 24 07/12/2015 0813   BILITOT 0.3 11/02/2019 0823   BILITOT 0.46 07/12/2015 0813       Impression and Plan: Mr. Milliron is a 69 year old white male.  He has a past history of renal cell carcinoma and also prostate cancer.  Now, he has a third malignancy.  This is metastatic small cell lung cancer.  It is recurrent.  Hopefully, we are seeing that he is responding.  Again, the PET scan will show [place] this.  I realize that his white cell count is a little bit on the low side today.  We will give him a dose of Neulasta after this treatment today.  He will come in next week for IV fluids.  We will plan for a fourth cycle to start in a couple weeks.  I am just glad that his quality of life is doing better.          Volanda Napoleon, MD 4/13/20218:48 AM

## 2019-11-10 NOTE — Patient Instructions (Addendum)
[facility] Discharge Instructions for Patients Receiving Chemotherapy  Today you received the following chemotherapy agents Cisplatin, Irinotecan  To help prevent nausea and vomiting after your treatment, we encourage you to take your nausea medication as prescribed by MD.   If you develop nausea and vomiting that is not controlled by your nausea medication, call the clinic.   BELOW ARE SYMPTOMS THAT SHOULD BE REPORTED IMMEDIATELY:  *FEVER GREATER THAN 100.5 F  *CHILLS WITH OR WITHOUT FEVER  NAUSEA AND VOMITING THAT IS NOT CONTROLLED WITH YOUR NAUSEA MEDICATION  *UNUSUAL SHORTNESS OF BREATH  *UNUSUAL BRUISING OR BLEEDING  TENDERNESS IN MOUTH AND THROAT WITH OR WITHOUT PRESENCE OF ULCERS  *URINARY PROBLEMS  *BOWEL PROBLEMS  UNUSUAL RASH Items with * indicate a potential emergency and should be followed up as soon as possible.  Feel free to call the clinic should you have any questions or concerns. The clinic phone number is [phone].  Please show the [facility] at check-in to the Emergency Department and triage nurse.  Aprepitant capsules What is this medicine? APREPITANT (ap RE pi tant) is used with other medicines to prevent nausea and vomiting caused by cancer treatment (chemotherapy). This medicine may be used for other purposes; ask your health care provider or pharmacist if you have questions. COMMON BRAND NAME(S): Emend What should I tell my health care provider before I take this medicine? They need to know if you have any of these conditions: liver disease an unusual or allergic reaction to aprepitant, fosaprepitant, other medicines, foods, dyes, or preservatives pregnant or trying to get pregnant breast-feeding How should I use this medicine? Take this medicine by mouth with a glass of water. Follow the directions on the prescription label. Usually, you will take your first dose one hour before your chemotherapy begins, and then once  daily in the morning for the next 2 days after your chemotherapy treatment. This medicine may be taken with or without food. Do not take more often than directed. Talk to your pediatrician regarding the use of this medicine in children. While this drug may be prescribed for children as young as 12 years for selected conditions, precautions do apply. Overdosage: If you think you have taken too much of this medicine contact a poison control center or emergency room at once. NOTE: This medicine is only for you. Do not share this medicine with others. What if I miss a dose? If you miss a dose, take it as soon as you can. If it is almost time for your next dose, take only that dose. Do not take double or extra doses. What may interact with this medicine? Do not take this medicine with any of these medicines: cisapride flibanserin lomitapide pimozide This medicine may also interact with the following medications: diltiazem male hormones, like estrogens or progestins and birth control pills medicines for fungal infections like ketoconazole and itraconazole medicines for HIV medicines for seizures or to control epilepsy like carbamazepine or phenytoin medicines used for sleep or anxiety disorders like alprazolam, diazepam, or midazolam nefazodone paroxetine ranolazine rifampin some chemotherapy medications like etoposide, ifosfamide, vinblastine, vincristine some antibiotics like clarithromycin, erythromycin, troleandomycin steroid medicines like dexamethasone or methylprednisolone tolbutamide warfarin This list may not describe all possible interactions. Give your health care provider a list of all the medicines, herbs, non-prescription drugs, or dietary supplements you use. Also tell them if you smoke, drink alcohol, or use illegal drugs. Some items may interact with your medicine. What should I watch  for while using this medicine? Do not take this medicine if you already have nausea and  vomiting. Ask your health care provider what to do if you already have nausea. Birth control pills and other methods of hormonal contraception (for example, IUD or patch) may not work properly while you are taking this medicine. Use an extra method of birth control during treatment and for 1 month after your last dose of aprepitant. This medicine should not be used continuously for a long time. Visit your doctor or health care professional for regular check-ups. This medicine may change your liver function blood test results. What side effects may I notice from receiving this medicine? Side effects that you should report to your doctor or health care professional as soon as possible: allergic reactions like skin rash, itching or hives, swelling of the face, lips, or tongue breathing problems changes in heart rhythm high or low blood pressure rectal bleeding serious dizziness or disorientation, confusion sharp or severe stomach pain sharp pain in your leg Side effects that usually do not require medical attention (report to your doctor or health care professional if they continue or are bothersome): constipation or diarrhea hair loss headache hiccups loss of appetite nausea upset stomach tiredness This list may not describe all possible side effects. Call your doctor for medical advice about side effects. You may report side effects to FDA at 1-800-FDA-1088. Where should I keep my medicine? Keep out of the reach of children. Store at room temperature between 20 and 25 degrees C (68 and 77 degrees F). Throw away any unused medicine after the expiration date. NOTE: This sheet is a summary. It may not cover all possible information. If you have questions about this medicine, talk to your doctor, pharmacist, or health care provider.  2020 Elsevier/Gold Standard (2018-04-25 13:36:30) Dexamethasone eye drops What is this medicine? DEXAMETHASONE (dex a METH a sone) is a corticosteroid. It is  used to treat swelling, redness, itching, and allergic reactions in the eye. This medicine may be used for other purposes; ask your health care provider or pharmacist if you have questions. COMMON BRAND NAME(S): AK-Dex, Decadron, Maxidex What should I tell my health care provider before I take this medicine? They need to know if you have any of these conditions: an active eye infection cataracts glaucoma contact lens wearer an unusual or allergic reaction to dexamethasone, corticosteroids, other medicines, foods, dyes, or preservatives pregnant or trying to get pregnant breast-feeding How should I use this medicine? This medicine is only for use in the eye. Do not take by mouth. Follow the directions on the prescription label. Wash hands before and after use. Tilt your head back slightly and pull the lower eyelid away from the eye to form a pouch. Try not to touch the tip of the dropper to your eye, fingertips, or other surface. Squeeze the prescribed number of drops into the pouch. Close the eye for a few moments to spread the drops. Do not use more often than directed. Talk to your pediatrician regarding the use of this medicine in children. Special care may be needed. Overdosage: If you think you have taken too much of this medicine contact a poison control center or emergency room at once. NOTE: This medicine is only for you. Do not share this medicine with others. What if I miss a dose? If you miss a dose, use it as soon as you can. If it is almost time for your next dose, use only that dose. Do  not use double or extra doses. What may interact with this medicine? Interactions are not expected. Do not use any other eye products without asking your doctor or health care professional. This list may not describe all possible interactions. Give your health care provider a list of all the medicines, herbs, non-prescription drugs, or dietary supplements you use. Also tell them if you smoke, drink  alcohol, or use illegal drugs. Some items may interact with your medicine. What should I watch for while using this medicine? Tell your health care professional if your symptoms do not start to get better or if they get worse. Have your eyes checked as directed. If you wear contact lenses, ask your doctor or health care professional when you can wear your lenses again. If you continue wearing your lenses during treatment, wait 15 minutes after the application of the product before inserting your lenses again. What side effects may I notice from receiving this medicine? Side effects that you should report to your doctor or health care professional as soon as possible: allergic reactions like skin rash, itching or hives, swelling of the face, lips, or tongue changes in vision eye pain, swelling, or redness Side effects that usually do not require medical attention (report to your doctor or health care professional if they continue or are bothersome): burning, discomfort, stinging when applied This list may not describe all possible side effects. Call your doctor for medical advice about side effects. You may report side effects to FDA at 1-800-FDA-1088. Where should I keep my medicine? Keep out of the reach of children. Store at room temperature between 15 and 30 degrees C (59 and 86 degrees F). Do not freeze. Throw away any unused medicine after the expiration date. NOTE: This sheet is a summary. It may not cover all possible information. If you have questions about this medicine, talk to your doctor, pharmacist, or health care provider.  2020 Elsevier/Gold Standard (2019-01-29 14:50:53) Atropine; Benzoate; Hyoscyamine; Methenamine; Methylene Blue; Phenyl tab What is this medicine? ATROPINE; BENZOIC ACID; HYOSCYAMINE; METHENAMINE; METHYLENE BLUE; PHENYL SALICYLATE (A troe peen; ben zo ik as id; hye oh SYE a meen; meth EN a meen; METH uh leen bloo; fen il suh LIS uh leyt) is used to help stop the  pain, burning, or discomfort caused by infection or irritation of the urinary tract. This medicine is not an antibiotic. It will not cure a urinary tract infection. This medicine may be used for other purposes; ask your health care provider or pharmacist if you have questions. COMMON BRAND NAME(S): MHP A, Prosed DS, Prosed EC, Trac Tabs 2X, U-Tran, UAA, Uretron, Urised, Uritract DS, Uritract-EC, Usept What should I tell my health care provider before I take this medicine? They need to know if you have any of these conditions: bladder or prostate problems or trouble passing urine dehydration glaucoma heart disease kidney disease liver disease myasthenia gravis stomach problems an unusual or allergic reaction to atropine; benzoic acid; hyoscyamine; methenamine; methylene blue; phenyl salicylate, other medicines, foods, dyes, or preservatives pregnant or trying to get pregnant breast-feeding How should I use this medicine? Take this medicine by mouth with a glass of water. Follow the directions on the prescription label. Take your medicine at regular intervals. Do not take it more often than directed. Do not stop taking except on your doctor's advice. Talk to your pediatrician regarding the use of this medicine in children. While this drug may be prescribed for children as young as 6 years for selected conditions,  precautions do apply. Overdosage: If you think you have taken too much of this medicine contact a poison control center or emergency room at once. NOTE: This medicine is only for you. Do not share this medicine with others. What if I miss a dose? If you miss a dose, take it as soon as you can. If it is almost time for your next dose, take only that dose. Do not take double or extra doses. What may interact with this medicine? antacids atropine antihistamines for allergy, cough and cold certain antibiotics like sulfacetamide and sulfamethoxazole certain medicines for bladder  problems like oxybutynin, tolterodine certain medicines for blood pressure like hydrochlorothiazide, chlorthalidone certain medicines for depression, anxiety or psychotic disturbances certain medicines for stomach problems like dicyclomine, hyoscyamine certain medicines for travel sickness like scopolamine certain medicines for Parkinson's disease like benztropine, trihexyphenidyl ipratropium ketoconazole MAOIs like Carbex, Eldepryl, Marplan, Nardil, and Parnate narcotic medicines for pain This list may not describe all possible interactions. Give your health care provider a list of all the medicines, herbs, non-prescription drugs, or dietary supplements you use. Also tell them if you smoke, drink alcohol, or use illegal drugs. Some items may interact with your medicine. What should I watch for while using this medicine? Tell your doctor or healthcare professional if your symptoms do not start to get better or if they get worse. You may get drowsy or dizzy. Do not drive, use machinery, or do anything that needs mental alertness until you know how this medicine affects you. Do not stand or sit up quickly, especially if you are an older patient. This reduces the risk of dizzy or fainting spells. Your mouth may get dry. Chewing sugarless gum or sucking hard candy, and drinking plenty of water may help. Contact your doctor if the problem does not go away or is severe. You may need to be on a special diet while taking this medicine. Ask your health care professional how many glasses of water or other fluids to drink each day. Also, ask which foods to include and which to avoid to help keep your urine acidic. Your urine must be acidic for this medicine to work. This medicine may cause dry eyes and blurred vision. If you wear contact lenses you may feel some discomfort. Lubricating drops may help. See your eye doctor if the problem does not go away or is severe. What side effects may I notice from  receiving this medicine? Side effects that you should report to your doctor or health care professional as soon as possible: allergic reactions like skin rash, itching or hives, swelling of the face, lips, or tongue blurred vision breathing problems dizziness rapid pulse trouble passing urine or change in the amount of urine Side effects that usually do not require medical attention (report to your doctor or health care professional if they continue or are bothersome): blue or blue-green urine or stools dry mouth flushing nausea, vomiting tiredness This list may not describe all possible side effects. Call your doctor for medical advice about side effects. You may report side effects to FDA at 1-800-FDA-1088. Where should I keep my medicine? Keep out of the reach of children. Store at room temperature between 15 and 25 degrees C (59 and 77 degrees F). Throw away any unused medicine after the expiration date. NOTE: This sheet is a summary. It may not cover all possible information. If you have questions about this medicine, talk to your doctor, pharmacist, or health care provider.  2020 Elsevier/Gold Standard (  2015-08-18 10:22:35) Irinotecan injection What is this medicine? IRINOTECAN (ir in oh TEE kan ) is a chemotherapy drug. It is used to treat colon and rectal cancer. This medicine may be used for other purposes; ask your health care provider or pharmacist if you have questions. COMMON BRAND NAME(S): Camptosar What should I tell my health care provider before I take this medicine? They need to know if you have any of these conditions: dehydration diarrhea infection (especially a virus infection such as chickenpox, cold sores, or herpes) liver disease low blood counts, like low white cell, platelet, or red cell counts low levels of calcium, magnesium, or potassium in the blood recent or ongoing radiation therapy an unusual or allergic reaction to irinotecan, other medicines,  foods, dyes, or preservatives pregnant or trying to get pregnant breast-feeding How should I use this medicine? This drug is given as an infusion into a vein. It is administered in a hospital or clinic by a specially trained health care professional. Talk to your pediatrician regarding the use of this medicine in children. Special care may be needed. Overdosage: If you think you have taken too much of this medicine contact a poison control center or emergency room at once. NOTE: This medicine is only for you. Do not share this medicine with others. What if I miss a dose? It is important not to miss your dose. Call your doctor or health care professional if you are unable to keep an appointment. What may interact with this medicine? This medicine may interact with the following medications: antiviral medicines for HIV or AIDS certain antibiotics like rifampin or rifabutin certain medicines for fungal infections like itraconazole, ketoconazole, posaconazole, and voriconazole certain medicines for seizures like carbamazepine, phenobarbital, phenotoin clarithromycin gemfibrozil nefazodone St. John's Wort This list may not describe all possible interactions. Give your health care provider a list of all the medicines, herbs, non-prescription drugs, or dietary supplements you use. Also tell them if you smoke, drink alcohol, or use illegal drugs. Some items may interact with your medicine. What should I watch for while using this medicine? Your condition will be monitored carefully while you are receiving this medicine. You will need important blood work done while you are taking this medicine. This drug may make you feel generally unwell. This is not uncommon, as chemotherapy can affect healthy cells as well as cancer cells. Report any side effects. Continue your course of treatment even though you feel ill unless your doctor tells you to stop. In some cases, you may be given additional medicines to  help with side effects. Follow all directions for their use. You may get drowsy or dizzy. Do not drive, use machinery, or do anything that needs mental alertness until you know how this medicine affects you. Do not stand or sit up quickly, especially if you are an older patient. This reduces the risk of dizzy or fainting spells. Call your health care professional for advice if you get a fever, chills, or sore throat, or other symptoms of a cold or flu. Do not treat yourself. This medicine decreases your body's ability to fight infections. Try to avoid being around people who are sick. Avoid taking products that contain aspirin, acetaminophen, ibuprofen, naproxen, or ketoprofen unless instructed by your doctor. These medicines may hide a fever. This medicine may increase your risk to bruise or bleed. Call your doctor or health care professional if you notice any unusual bleeding. Be careful brushing and flossing your teeth or using a toothpick because  you may get an infection or bleed more easily. If you have any dental work done, tell your dentist you are receiving this medicine. Do not become pregnant while taking this medicine or for 6 months after stopping it. Women should inform their health care professional if they wish to become pregnant or think they might be pregnant. Men should not father a child while taking this medicine and for 3 months after stopping it. There is potential for serious side effects to an unborn child. Talk to your health care professional for more information. Do not breast-feed an infant while taking this medicine or for 7 days after stopping it. This medicine has caused ovarian failure in some women. This medicine may make it more difficult to get pregnant. Talk to your health care professional if you are concerned about your fertility. This medicine has caused decreased sperm counts in some men. This may make it more difficult to father a child. Talk to your health care  professional if you are concerned about your fertility. What side effects may I notice from receiving this medicine? Side effects that you should report to your doctor or health care professional as soon as possible: allergic reactions like skin rash, itching or hives, swelling of the face, lips, or tongue chest pain diarrhea flushing, runny nose, sweating during infusion low blood counts - this medicine may decrease the number of white blood cells, red blood cells and platelets. You may be at increased risk for infections and bleeding. nausea, vomiting pain, swelling, warmth in the leg signs of decreased platelets or bleeding - bruising, pinpoint red spots on the skin, black, tarry stools, blood in the urine signs of infection - fever or chills, cough, sore throat, pain or difficulty passing urine signs of decreased red blood cells - unusually weak or tired, fainting spells, lightheadedness Side effects that usually do not require medical attention (report to your doctor or health care professional if they continue or are bothersome): constipation hair loss headache loss of appetite mouth sores stomach pain This list may not describe all possible side effects. Call your doctor for medical advice about side effects. You may report side effects to FDA at 1-800-FDA-1088. Where should I keep my medicine? This drug is given in a hospital or clinic and will not be stored at home. NOTE: This sheet is a summary. It may not cover all possible information. If you have questions about this medicine, talk to your doctor, pharmacist, or health care provider.  2020 Elsevier/Gold Standard (2018-09-05 10:09:17) Cisplatin injection What is this medicine? CISPLATIN (SIS pla tin) is a chemotherapy drug. It targets fast dividing cells, like cancer cells, and causes these cells to die. This medicine is used to treat many types of cancer like bladder, ovarian, and testicular cancers. This medicine may be  used for other purposes; ask your health care provider or pharmacist if you have questions. COMMON BRAND NAME(S): Platinol, Platinol -AQ What should I tell my health care provider before I take this medicine? They need to know if you have any of these conditions: eye disease, vision problems hearing problems kidney disease low blood counts, like white cells, platelets, or red blood cells tingling of the fingers or toes, or other nerve disorder an unusual or allergic reaction to cisplatin, carboplatin, oxaliplatin, other medicines, foods, dyes, or preservatives pregnant or trying to get pregnant breast-feeding How should I use this medicine? This drug is given as an infusion into a vein. It is administered in a hospital  or clinic by a specially trained health care professional. Talk to your pediatrician regarding the use of this medicine in children. Special care may be needed. Overdosage: If you think you have taken too much of this medicine contact a poison control center or emergency room at once. NOTE: This medicine is only for you. Do not share this medicine with others. What if I miss a dose? It is important not to miss a dose. Call your doctor or health care professional if you are unable to keep an appointment. What may interact with this medicine? This medicine may interact with the following medications: foscarnet certain antibiotics like amikacin, gentamicin, neomycin, polymyxin B, streptomycin, tobramycin, vancomycin This list may not describe all possible interactions. Give your health care provider a list of all the medicines, herbs, non-prescription drugs, or dietary supplements you use. Also tell them if you smoke, drink alcohol, or use illegal drugs. Some items may interact with your medicine. What should I watch for while using this medicine? Your condition will be monitored carefully while you are receiving this medicine. You will need important blood work done while you  are taking this medicine. This drug may make you feel generally unwell. This is not uncommon, as chemotherapy can affect healthy cells as well as cancer cells. Report any side effects. Continue your course of treatment even though you feel ill unless your doctor tells you to stop. This medicine may increase your risk of getting an infection. Call your healthcare professional for advice if you get a fever, chills, or sore throat, or other symptoms of a cold or flu. Do not treat yourself. Try to avoid being around people who are sick. Avoid taking medicines that contain aspirin, acetaminophen, ibuprofen, naproxen, or ketoprofen unless instructed by your healthcare professional. These medicines may hide a fever. This medicine may increase your risk to bruise or bleed. Call your doctor or health care professional if you notice any unusual bleeding. Be careful brushing and flossing your teeth or using a toothpick because you may get an infection or bleed more easily. If you have any dental work done, tell your dentist you are receiving this medicine. Do not become pregnant while taking this medicine or for 14 months after stopping it. Women should inform their healthcare professional if they wish to become pregnant or think they might be pregnant. Men should not father a child while taking this medicine and for 11 months after stopping it. There is potential for serious side effects to an unborn child. Talk to your healthcare professional for more information. Do not breast-feed an infant while taking this medicine. This medicine has caused ovarian failure in some women. This medicine may make it more difficult to get pregnant. Talk to your healthcare professional if you are concerned about your fertility. This medicine has caused decreased sperm counts in some men. This may make it more difficult to father a child. Talk to your healthcare professional if you are concerned about your fertility. Drink fluids as  directed while you are taking this medicine. This will help protect your kidneys. Call your doctor or health care professional if you get diarrhea. Do not treat yourself. What side effects may I notice from receiving this medicine? Side effects that you should report to your doctor or health care professional as soon as possible: allergic reactions like skin rash, itching or hives, swelling of the face, lips, or tongue blurred vision changes in vision decreased hearing or ringing of the ears nausea, vomiting pain,  redness, or irritation at site where injected pain, tingling, numbness in the hands or feet signs and symptoms of bleeding such as bloody or black, tarry stools; red or dark brown urine; spitting up blood or brown material that looks like coffee grounds; red spots on the skin; unusual bruising or bleeding from the eyes, gums, or nose signs and symptoms of infection like fever; chills; cough; sore throat; pain or trouble passing urine signs and symptoms of kidney injury like trouble passing urine or change in the amount of urine signs and symptoms of low red blood cells or anemia such as unusually weak or tired; feeling faint or lightheaded; falls; breathing problems Side effects that usually do not require medical attention (report to your doctor or health care professional if they continue or are bothersome): loss of appetite mouth sores muscle cramps This list may not describe all possible side effects. Call your doctor for medical advice about side effects. You may report side effects to FDA at 1-800-FDA-1088. Where should I keep my medicine? This drug is given in a hospital or clinic and will not be stored at home. NOTE: This sheet is a summary. It may not cover all possible information. If you have questions about this medicine, talk to your doctor, pharmacist, or health care provider.  2020 Elsevier/Gold Standard (2018-07-11 15:59:17)

## 2019-11-10 NOTE — Progress Notes (Signed)
Cbc and cmet reviewed with MD, ok to treat despite counts.

## 2019-11-11 ENCOUNTER — Telehealth: Payer: Self-pay | Admitting: *Deleted

## 2019-11-11 NOTE — Telephone Encounter (Signed)
Message received from patient stating that his On-Pro has fallen off.  Call placed back to patient and patient instructed to come in tomorrow at 8:00AM for injection and to bring the On-Pro injector in with him tomorrow in a sandwich bag.

## 2019-11-12 ENCOUNTER — Other Ambulatory Visit: Payer: Self-pay

## 2019-11-12 ENCOUNTER — Inpatient Hospital Stay: Payer: 59

## 2019-11-12 VITALS — BP 142/67 | HR 79 | Temp 98.9°F | Resp 18

## 2019-11-12 DIAGNOSIS — C3491 Malignant neoplasm of unspecified part of right bronchus or lung: Secondary | ICD-10-CM | POA: Diagnosis not present

## 2019-11-12 DIAGNOSIS — L03119 Cellulitis of unspecified part of limb: Secondary | ICD-10-CM

## 2019-11-12 DIAGNOSIS — Z5111 Encounter for antineoplastic chemotherapy: Secondary | ICD-10-CM | POA: Diagnosis not present

## 2019-11-12 DIAGNOSIS — Z7982 Long term (current) use of aspirin: Secondary | ICD-10-CM | POA: Diagnosis not present

## 2019-11-12 DIAGNOSIS — Z85528 Personal history of other malignant neoplasm of kidney: Secondary | ICD-10-CM | POA: Diagnosis not present

## 2019-11-12 DIAGNOSIS — Z79899 Other long term (current) drug therapy: Secondary | ICD-10-CM | POA: Diagnosis not present

## 2019-11-12 DIAGNOSIS — Z8546 Personal history of malignant neoplasm of prostate: Secondary | ICD-10-CM | POA: Diagnosis not present

## 2019-11-12 DIAGNOSIS — E222 Syndrome of inappropriate secretion of antidiuretic hormone: Secondary | ICD-10-CM | POA: Diagnosis not present

## 2019-11-12 DIAGNOSIS — Z7952 Long term (current) use of systemic steroids: Secondary | ICD-10-CM | POA: Diagnosis not present

## 2019-11-12 DIAGNOSIS — Z5189 Encounter for other specified aftercare: Secondary | ICD-10-CM | POA: Diagnosis not present

## 2019-11-12 MED ORDER — PEGFILGRASTIM INJECTION 6 MG/0.6ML ~~LOC~~
PREFILLED_SYRINGE | SUBCUTANEOUS | Status: AC
  Filled 2019-11-12: qty 0.6

## 2019-11-12 MED ORDER — PEGFILGRASTIM INJECTION 6 MG/0.6ML ~~LOC~~
6.0000 mg | PREFILLED_SYRINGE | Freq: Once | SUBCUTANEOUS | Status: AC
  Administered 2019-11-12: 6 mg via SUBCUTANEOUS

## 2019-11-12 MED ORDER — PEGFILGRASTIM 6 MG/0.6ML ~~LOC~~ PSKT: 6.0000 mg | PREFILLED_SYRINGE | Freq: Once | SUBCUTANEOUS | Status: DC

## 2019-11-12 NOTE — Patient Instructions (Signed)

## 2019-11-12 NOTE — Progress Notes (Signed)
Patient returns for Neulasta after OnPro fell off. Pt brings OnPro in a bag and Belsomra prescription to be destroyed. dph

## 2019-11-16 NOTE — Progress Notes (Signed)
Pharmacist Chemotherapy Monitoring - Follow Up Assessment    I verify that I have reviewed each item in the below checklist:  . Regimen for the patient is scheduled for the appropriate day and plan matches scheduled date. Marland Kitchen Appropriate non-routine labs are ordered dependent on drug ordered. . If applicable, additional medications reviewed and ordered per protocol based on lifetime cumulative doses and/or treatment regimen.   Plan for follow-up and/or issues identified: No . I-vent associated with next due treatment: No . MD and/or nursing notified: No  Acquanetta Belling 11/16/2019 1:48 PM

## 2019-11-17 ENCOUNTER — Inpatient Hospital Stay: Payer: 59

## 2019-11-17 ENCOUNTER — Other Ambulatory Visit: Payer: Self-pay

## 2019-11-17 VITALS — BP 140/64 | HR 88 | Temp 97.7°F | Resp 17

## 2019-11-17 DIAGNOSIS — Z7952 Long term (current) use of systemic steroids: Secondary | ICD-10-CM | POA: Diagnosis not present

## 2019-11-17 DIAGNOSIS — Z5189 Encounter for other specified aftercare: Secondary | ICD-10-CM | POA: Diagnosis not present

## 2019-11-17 DIAGNOSIS — E222 Syndrome of inappropriate secretion of antidiuretic hormone: Secondary | ICD-10-CM | POA: Diagnosis not present

## 2019-11-17 DIAGNOSIS — L03119 Cellulitis of unspecified part of limb: Secondary | ICD-10-CM

## 2019-11-17 DIAGNOSIS — C3491 Malignant neoplasm of unspecified part of right bronchus or lung: Secondary | ICD-10-CM | POA: Diagnosis not present

## 2019-11-17 DIAGNOSIS — Z5111 Encounter for antineoplastic chemotherapy: Secondary | ICD-10-CM | POA: Diagnosis not present

## 2019-11-17 DIAGNOSIS — Z8546 Personal history of malignant neoplasm of prostate: Secondary | ICD-10-CM | POA: Diagnosis not present

## 2019-11-17 DIAGNOSIS — Z7982 Long term (current) use of aspirin: Secondary | ICD-10-CM | POA: Diagnosis not present

## 2019-11-17 DIAGNOSIS — Z79899 Other long term (current) drug therapy: Secondary | ICD-10-CM | POA: Diagnosis not present

## 2019-11-17 DIAGNOSIS — Z85528 Personal history of other malignant neoplasm of kidney: Secondary | ICD-10-CM | POA: Diagnosis not present

## 2019-11-17 LAB — CBC WITH DIFFERENTIAL (CANCER CENTER ONLY)
Abs Immature Granulocytes: 0.54 10*3/uL — ABNORMAL HIGH (ref 0.00–0.07)
Basophils Absolute: 0 10*3/uL (ref 0.0–0.1)
Basophils Relative: 0 %
Eosinophils Absolute: 0 10*3/uL (ref 0.0–0.5)
Eosinophils Relative: 0 %
HCT: 27.3 % — ABNORMAL LOW (ref 39.0–52.0)
Hemoglobin: 8.9 g/dL — ABNORMAL LOW (ref 13.0–17.0)
Immature Granulocytes: 7 %
Lymphocytes Relative: 5 %
Lymphs Abs: 0.5 10*3/uL — ABNORMAL LOW (ref 0.7–4.0)
MCH: 30.4 pg (ref 26.0–34.0)
MCHC: 32.6 g/dL (ref 30.0–36.0)
MCV: 93.2 fL (ref 80.0–100.0)
Monocytes Absolute: 1 10*3/uL (ref 0.1–1.0)
Monocytes Relative: 12 %
Neutro Abs: 6.3 10*3/uL (ref 1.7–7.7)
Neutrophils Relative %: 76 %
Platelet Count: 125 10*3/uL — ABNORMAL LOW (ref 150–400)
RBC: 2.93 MIL/uL — ABNORMAL LOW (ref 4.22–5.81)
RDW: 17.5 % — ABNORMAL HIGH (ref 11.5–15.5)
WBC Count: 8.3 10*3/uL (ref 4.0–10.5)
nRBC: 0 % (ref 0.0–0.2)

## 2019-11-17 LAB — CMP (CANCER CENTER ONLY)
ALT: 13 U/L (ref 0–44)
AST: 12 U/L — ABNORMAL LOW (ref 15–41)
Albumin: 3.4 g/dL — ABNORMAL LOW (ref 3.5–5.0)
Alkaline Phosphatase: 110 U/L (ref 38–126)
Anion gap: 7 (ref 5–15)
BUN: 19 mg/dL (ref 8–23)
CO2: 25 mmol/L (ref 22–32)
Calcium: 8.9 mg/dL (ref 8.9–10.3)
Chloride: 103 mmol/L (ref 98–111)
Creatinine: 1.18 mg/dL (ref 0.61–1.24)
GFR, Est AFR Am: >60 mL/min (ref >60)
GFR, Estimated: >60 mL/min (ref >60)
Glucose, Bld: 120 mg/dL — ABNORMAL HIGH (ref 70–99)
Potassium: 3.9 mmol/L (ref 3.5–5.1)
Sodium: 135 mmol/L (ref 135–145)
Total Bilirubin: 0.2 mg/dL — ABNORMAL LOW (ref 0.3–1.2)
Total Protein: 5.5 g/dL — ABNORMAL LOW (ref 6.5–8.1)

## 2019-11-17 MED ORDER — SODIUM CHLORIDE 0.9% FLUSH
10.0000 mL | INTRAVENOUS | Status: DC | PRN
  Administered 2019-11-17: 10 mL via INTRAVENOUS
  Filled 2019-11-17: qty 10

## 2019-11-17 MED ORDER — SODIUM CHLORIDE 0.9 % IV SOLN
Freq: Once | INTRAVENOUS | Status: AC
  Filled 2019-11-17: qty 250

## 2019-11-17 MED ORDER — HEPARIN SOD (PORK) LOCK FLUSH 100 UNIT/ML IV SOLN
500.0000 [IU] | Freq: Once | INTRAVENOUS | Status: AC
  Administered 2019-11-17: 500 [IU] via INTRAVENOUS
  Filled 2019-11-17: qty 5

## 2019-11-17 MED FILL — TRIAMCINOLONE ACETONIDE 55: 55 | 30 days supply | Qty: 17 | Fill #1

## 2019-11-17 NOTE — Patient Instructions (Signed)
Implanted Port Insertion, Care After °This sheet gives you information about how to care for yourself after your procedure. Your health care provider may also give you more specific instructions. If you have problems or questions, contact your health care provider. °What can I expect after the procedure? °After the procedure, it is common to have: °· Discomfort at the port insertion site. °· Bruising on the skin over the port. This should improve over 3-4 days. °Follow these instructions at home: °Port care °· After your port is placed, you will get a manufacturer's information card. The card has information about your port. Keep this card with you at all times. °· Take care of the port as told by your health care provider. Ask your health care provider if you or a family member can get training for taking care of the port at home. A home health care nurse may also take care of the port. °· Make sure to remember what type of port you have. °Incision care ° °  ° °· Follow instructions from your health care provider about how to take care of your port insertion site. Make sure you: °? Wash your hands with soap and water before and after you change your bandage (dressing). If soap and water are not available, use hand sanitizer. °? Change your dressing as told by your health care provider. °? Leave stitches (sutures), skin glue, or adhesive strips in place. These skin closures may need to stay in place for 2 weeks or longer. If adhesive strip edges start to loosen and curl up, you may trim the loose edges. Do not remove adhesive strips completely unless your health care provider tells you to do that. °· Check your port insertion site every day for signs of infection. Check for: °? Redness, swelling, or pain. °? Fluid or blood. °? Warmth. °? Pus or a bad smell. °Activity °· Return to your normal activities as told by your health care provider. Ask your health care provider what activities are safe for you. °· Do not  lift anything that is heavier than 10 lb (4.5 kg), or the limit that you are told, until your health care provider says that it is safe. °General instructions °· Take over-the-counter and prescription medicines only as told by your health care provider. °· Do not take baths, swim, or use a hot tub until your health care provider approves. Ask your health care provider if you may take showers. You may only be allowed to take sponge baths. °· Do not drive for 24 hours if you were given a sedative during your procedure. °· Wear a medical alert bracelet in case of an emergency. This will tell any health care providers that you have a port. °· Keep all follow-up visits as told by your health care provider. This is important. °Contact a health care provider if: °· You cannot flush your port with saline as directed, or you cannot draw blood from the port. °· You have a fever or chills. °· You have redness, swelling, or pain around your port insertion site. °· You have fluid or blood coming from your port insertion site. °· Your port insertion site feels warm to the touch. °· You have pus or a bad smell coming from the port insertion site. °Get help right away if: °· You have chest pain or shortness of breath. °· You have bleeding from your port that you cannot control. °Summary °· Take care of the port as told by your health   care provider. Keep the manufacturer's information card with you at all times. °· Change your dressing as told by your health care provider. °· Contact a health care provider if you have a fever or chills or if you have redness, swelling, or pain around your port insertion site. °· Keep all follow-up visits as told by your health care provider. °This information is not intended to replace advice given to you by your health care provider. Make sure you discuss any questions you have with your health care provider. °Document Revised: 02/11/2018 Document Reviewed: 02/11/2018 °Elsevier Patient Education ©  2020 Elsevier Inc. ° °

## 2019-11-17 NOTE — Patient Instructions (Signed)

## 2019-11-19 ENCOUNTER — Encounter (HOSPITAL_COMMUNITY)
Admission: RE | Admit: 2019-11-19 | Discharge: 2019-11-19 | Disposition: A | Discharge status: Home / Self Care | Payer: 59 | Source: Ambulatory Visit | Attending: Hematology & Oncology | Admitting: Hematology & Oncology

## 2019-11-19 ENCOUNTER — Other Ambulatory Visit: Payer: Self-pay

## 2019-11-19 DIAGNOSIS — C3490 Malignant neoplasm of unspecified part of unspecified bronchus or lung: Secondary | ICD-10-CM | POA: Diagnosis not present

## 2019-11-19 DIAGNOSIS — C3491 Malignant neoplasm of unspecified part of right bronchus or lung: Secondary | ICD-10-CM | POA: Diagnosis not present

## 2019-11-19 DIAGNOSIS — C787 Secondary malignant neoplasm of liver and intrahepatic bile duct: Secondary | ICD-10-CM | POA: Diagnosis not present

## 2019-11-19 LAB — GLUCOSE, CAPILLARY: Glucose-Capillary: 96 mg/dL (ref 70–99)

## 2019-11-19 MED ORDER — FLUDEOXYGLUCOSE F - 18 (FDG) INJECTION
9.4800 | Freq: Once | INTRAVENOUS | Status: AC | PRN
  Administered 2019-11-19: 9.48 via INTRAVENOUS

## 2019-11-20 ENCOUNTER — Encounter: Payer: Self-pay | Admitting: Hematology & Oncology

## 2019-11-20 NOTE — Progress Notes (Signed)
DISCONTINUE OFF PATHWAY REGIMEN - Small Cell Lung   [id number]:Cisplatin + Irinotecan:   A cycle is every 21 days:     Irinotecan      Cisplatin   **Always confirm dose/schedule in your pharmacy ordering system**  REASON: Disease Progression PRIOR TREATMENT: Off Pathway: Cisplatin + Irinotecan TREATMENT RESPONSE: Partial Response (PR)  START OFF PATHWAY REGIMEN - Small Cell Lung   [id number]:Docetaxel 75 mg/m2:   A cycle is every 21 days:     Docetaxel   **Always confirm dose/schedule in your pharmacy ordering system**  Patient Characteristics: Relapsed or Progressive Disease, Third Line and Beyond Therapeutic Status: Relapsed or Progressive Disease Line of Therapy: Third Line and Beyond  Intent of Therapy: Non-Curative / Palliative Intent, Discussed with Patient

## 2019-11-23 ENCOUNTER — Inpatient Hospital Stay: Payer: 59

## 2019-11-23 ENCOUNTER — Encounter: Payer: Self-pay | Admitting: Hematology & Oncology

## 2019-11-23 ENCOUNTER — Other Ambulatory Visit: Payer: Self-pay | Admitting: Family

## 2019-11-23 ENCOUNTER — Inpatient Hospital Stay (HOSPITAL_BASED_OUTPATIENT_CLINIC_OR_DEPARTMENT_OTHER): Payer: 59 | Admitting: Hematology & Oncology

## 2019-11-23 ENCOUNTER — Telehealth: Payer: Self-pay | Admitting: Hematology & Oncology

## 2019-11-23 ENCOUNTER — Other Ambulatory Visit: Payer: Self-pay

## 2019-11-23 VITALS — BP 143/66 | HR 90 | Resp 17

## 2019-11-23 VITALS — BP 132/70 | HR 91 | Temp 97.3°F | Resp 19 | Ht 68.0 in | Wt 190.0 lb

## 2019-11-23 DIAGNOSIS — C3491 Malignant neoplasm of unspecified part of right bronchus or lung: Secondary | ICD-10-CM

## 2019-11-23 DIAGNOSIS — Z5189 Encounter for other specified aftercare: Secondary | ICD-10-CM | POA: Diagnosis not present

## 2019-11-23 DIAGNOSIS — Z5111 Encounter for antineoplastic chemotherapy: Secondary | ICD-10-CM | POA: Diagnosis not present

## 2019-11-23 DIAGNOSIS — L03119 Cellulitis of unspecified part of limb: Secondary | ICD-10-CM

## 2019-11-23 DIAGNOSIS — Z8546 Personal history of malignant neoplasm of prostate: Secondary | ICD-10-CM | POA: Diagnosis not present

## 2019-11-23 DIAGNOSIS — Z7982 Long term (current) use of aspirin: Secondary | ICD-10-CM | POA: Diagnosis not present

## 2019-11-23 DIAGNOSIS — E222 Syndrome of inappropriate secretion of antidiuretic hormone: Secondary | ICD-10-CM | POA: Diagnosis not present

## 2019-11-23 DIAGNOSIS — Z7952 Long term (current) use of systemic steroids: Secondary | ICD-10-CM | POA: Diagnosis not present

## 2019-11-23 DIAGNOSIS — Z79899 Other long term (current) drug therapy: Secondary | ICD-10-CM | POA: Diagnosis not present

## 2019-11-23 DIAGNOSIS — Z85528 Personal history of other malignant neoplasm of kidney: Secondary | ICD-10-CM | POA: Diagnosis not present

## 2019-11-23 LAB — CMP (CANCER CENTER ONLY)
ALT: 16 U/L (ref 0–44)
AST: 12 U/L — ABNORMAL LOW (ref 15–41)
Albumin: 3.5 g/dL (ref 3.5–5.0)
Alkaline Phosphatase: 122 U/L (ref 38–126)
Anion gap: 7 (ref 5–15)
BUN: 22 mg/dL (ref 8–23)
CO2: 25 mmol/L (ref 22–32)
Calcium: 9.4 mg/dL (ref 8.9–10.3)
Chloride: 108 mmol/L (ref 98–111)
Creatinine: 1.33 mg/dL — ABNORMAL HIGH (ref 0.61–1.24)
GFR, Est AFR Am: >60 mL/min (ref >60)
GFR, Estimated: 55 mL/min — ABNORMAL LOW (ref >60)
Glucose, Bld: 106 mg/dL — ABNORMAL HIGH (ref 70–99)
Potassium: 4.1 mmol/L (ref 3.5–5.1)
Sodium: 140 mmol/L (ref 135–145)
Total Bilirubin: 0.4 mg/dL (ref 0.3–1.2)
Total Protein: 5.9 g/dL — ABNORMAL LOW (ref 6.5–8.1)

## 2019-11-23 LAB — CBC WITH DIFFERENTIAL (CANCER CENTER ONLY)
Abs Immature Granulocytes: 0.07 10*3/uL (ref 0.00–0.07)
Basophils Absolute: 0 10*3/uL (ref 0.0–0.1)
Basophils Relative: 1 %
Eosinophils Absolute: 0.1 10*3/uL (ref 0.0–0.5)
Eosinophils Relative: 1 %
HCT: 29.3 % — ABNORMAL LOW (ref 39.0–52.0)
Hemoglobin: 9.8 g/dL — ABNORMAL LOW (ref 13.0–17.0)
Immature Granulocytes: 1 %
Lymphocytes Relative: 5 %
Lymphs Abs: 0.3 10*3/uL — ABNORMAL LOW (ref 0.7–4.0)
MCH: 31.7 pg (ref 26.0–34.0)
MCHC: 33.4 g/dL (ref 30.0–36.0)
MCV: 94.8 fL (ref 80.0–100.0)
Monocytes Absolute: 0.4 10*3/uL (ref 0.1–1.0)
Monocytes Relative: 7 %
Neutro Abs: 5 10*3/uL (ref 1.7–7.7)
Neutrophils Relative %: 85 %
Platelet Count: 164 10*3/uL (ref 150–400)
RBC: 3.09 MIL/uL — ABNORMAL LOW (ref 4.22–5.81)
RDW: 18.9 % — ABNORMAL HIGH (ref 11.5–15.5)
WBC Count: 5.8 10*3/uL (ref 4.0–10.5)
nRBC: 0 % (ref 0.0–0.2)

## 2019-11-23 LAB — LACTATE DEHYDROGENASE: LDH: 211 U/L — ABNORMAL HIGH (ref 98–192)

## 2019-11-23 MED ORDER — SODIUM CHLORIDE 0.9 % IV SOLN
Freq: Once | INTRAVENOUS | Status: AC
  Filled 2019-11-23: qty 250

## 2019-11-23 MED ORDER — SODIUM CHLORIDE 0.9% FLUSH
10.0000 mL | Freq: Once | INTRAVENOUS | Status: AC | PRN
  Administered 2019-11-23: 10 mL
  Filled 2019-11-23: qty 10

## 2019-11-23 MED ORDER — ALTEPLASE 2 MG IJ SOLR
2.0000 mg | Freq: Once | INTRAMUSCULAR | Status: DC | PRN
  Filled 2019-11-23: qty 2

## 2019-11-23 MED ORDER — HEPARIN SOD (PORK) LOCK FLUSH 100 UNIT/ML IV SOLN
500.0000 [IU] | Freq: Once | INTRAVENOUS | Status: AC | PRN
  Administered 2019-11-23: 500 [IU]
  Filled 2019-11-23: qty 5

## 2019-11-23 MED FILL — DEMECLOCYCLINE 150 MG TAB: 150 | 30 days supply | Qty: 120 | Fill #0

## 2019-11-23 NOTE — Patient Instructions (Signed)

## 2019-11-23 NOTE — Progress Notes (Signed)
Pharmacist Chemotherapy Monitoring - Initial Assessment    Anticipated start date: 11/30/19   Regimen:  . Are orders appropriate based on the patient's diagnosis, regimen, and cycle? Yes . Does the plan date match the patient's scheduled date? Yes . Is the sequencing of drugs appropriate? Yes . Are the premedications appropriate for the patient's regimen? Yes . Prior Authorization for treatment is: Not Started o If applicable, is the correct biosimilar selected based on the patient's insurance? yes  Organ Function and Labs: Marland Kitchen Are dose adjustments needed based on the patient's renal function, hepatic function, or hematologic function? No . Are appropriate labs ordered prior to the start of patient's treatment? Yes . Other organ system assessment, if indicated: N/A . The following baseline labs, if indicated, have been ordered: N/A  Dose Assessment: . Are the drug doses appropriate? Yes . Are the following correct: o Drug concentrations Yes o IV fluid compatible with drug Yes o Administration routes Yes o Timing of therapy Yes . If applicable, does the patient have documented access for treatment and/or plans for port-a-cath placement? yes . If applicable, have lifetime cumulative doses been properly documented and assessed? not applicable Lifetime Dose Tracking  . Carboplatin: 2,460 mg = 0.01 % of the maximum lifetime dose of 999,999,999 mg  o   Toxicity Monitoring/Prevention: . The patient has the following take home antiemetics prescribed: Ondansetron, has multiple antiemetics from previous treatment . The patient has the following take home medications prescribed: N/A . Medication allergies and previous infusion related reactions, if applicable, have been reviewed and addressed. Yes . The patient's current medication list has been assessed for drug-drug interactions with their chemotherapy regimen. no significant drug-drug interactions were identified on review.  Order  Review: . Are the treatment plan orders signed? Yes . Is the patient scheduled to see a provider prior to their treatment? No  I verify that I have reviewed each item in the above checklist and answered each question accordingly.  Taylor Elliott, [name] 11/23/2019 11:54 AM

## 2019-11-23 NOTE — Patient Instructions (Signed)

## 2019-11-23 NOTE — Progress Notes (Signed)
Hematology and Oncology Follow Up Visit  Taylor Elliott [id number] [birth year] 69 y.o. 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  Unfortunately, Taylor Elliott is progressing again.  We did do a PET scan on him.  PET scan was done last week.  The PET scan shows that Taylor Elliott does have progression, mostly in the liver.  We are beginning to run out of options for him.  Taylor Elliott still has a decent performance status.  The SIADH is well controlled on the demeclocycline.  His sodium was 140 today.  Taylor Elliott and his family are planning on a big beach trip this weekend.  As such, we will not do any treatment on him until next week.  Taylor Elliott has had no problems with nausea or vomiting.  Taylor Elliott has had no obvious change in bowel or bladder habits.  Taylor Elliott is still smoking a little bit.  There is no increased cough or shortness of breath.  Taylor Elliott has had no headache.  Taylor Elliott goes for MRI of the brain on Monday, May 3.  Overall, I would say his performance status is ECOG 1.      Medications:  Current Outpatient Medications:  .  amLODipine (NORVASC) 10 MG tablet, Take 10 mg by mouth daily., Disp: , Rfl:  .  aspirin EC 81 MG tablet, Take 81 mg by mouth daily., Disp: , Rfl:  .  calcium carbonate (TUMS - DOSED IN MG ELEMENTAL CALCIUM) 500 MG chewable tablet, Chew 2 tablets by mouth daily as needed for indigestion or heartburn. , Disp: , Rfl:  .  ciprofloxacin-dexamethasone (CIPRODEX) OTIC suspension, Place 5 drops into both ears as needed., Disp: , Rfl:  .  COMBIVENT RESPIMAT  20-100 MCG/ACT AERS respimat, Inhale 1 puff into the lungs 2 (two) times daily., Disp: 4 g, Rfl: 4 .  demeclocycline (DECLOMYCIN) 150 MG tablet, , Disp: , Rfl:  .  fenofibrate 160 MG tablet, TAKE 1 TABLET (160 MG TOTAL) BY MOUTH DAILY., Disp: 90 tablet, Rfl: 1 .  HYDROcodone-homatropine (HYCODAN) 5-1.5 MG/5ML syrup, Take 5 mLs by mouth every 4 (four) hours as needed for cough., Disp: 300 mL, Rfl: 0 .  lactulose (CHRONULAC) 10 GM/15ML solution, Take 15 mLs (10 g total) by mouth 2 (two) times daily as needed for mild constipation., Disp: 473 mL, Rfl: 1 .  LORazepam (ATIVAN) 1 MG tablet, Take 1 tablet (1 mg total) by mouth every 6 (six) hours as needed for anxiety., Disp: 60 tablet, Rfl: 0 .  Multiple Vitamin (MULTIVITAMIN) tablet, Take 1 tablet by mouth daily., Disp: , Rfl:  .  pyridoxine (B-6) 200 MG tablet, Take 200 mg by mouth daily., Disp: , Rfl:  .  solifenacin (VESICARE) 10 MG tablet, Take 10 mg by mouth every evening., Disp: , Rfl:  .  temazepam (RESTORIL) 30 MG capsule, Take 30 mg by mouth at bedtime as needed., Disp: , Rfl:  .  triamcinolone (NASACORT) 55 MCG/ACT AERO nasal inhaler, , Disp: , Rfl:  .  UNABLE TO  FIND, Med Name: Allergy shots., Disp: , Rfl:  .  dronabinol (MARINOL) 5 MG capsule, Take 1 capsule (5 mg total) by mouth 2 (two) times daily before a meal. (Patient not taking: Reported on 11/23/2019), Disp: 60 capsule, Rfl: 1 .  EPINEPHrine 0.3 mg/0.3 mL IJ SOAJ injection, Inject 0.3 mg into the muscle once., Disp: , Rfl:  No current facility-administered medications for this visit.  Facility-Administered Medications Ordered in Other Visits:  .  alteplase (CATHFLO ACTIVASE) injection 2 mg, 2 mg, Intracatheter, Once PRN, [place], Sarah M, NP .  heparin lock flush 100 unit/mL, 500 Units, Intracatheter, Once PRN, [place], Sarah M, NP .  sodium chloride flush (NS) 0.9 % injection 10 mL, 10 mL, Intracatheter, Once PRN, [place], Holli Humbles, NP  Allergies:  Allergies  Allergen  Reactions  . Bee Venom Anaphylaxis  . Clindamycin/Lincomycin Hives    Past Medical History, Surgical history, Social history, and Family History were reviewed and updated.  Review of Systems: Review of Systems  Constitutional: Negative.   HENT:  Negative.   Eyes: Negative.   Respiratory: Positive for cough.   Cardiovascular: Negative.   Gastrointestinal: Negative.   Endocrine: Negative.   Genitourinary: Negative.    Musculoskeletal: Positive for back pain, myalgias and neck pain.  Skin: Positive for itching and rash.  Neurological: Negative.   Hematological: Negative.   Psychiatric/Behavioral: Negative.     Physical Exam:  height is 5\' 8"  (1.727 m) and weight is 190 lb (86.2 kg). His temporal temperature is 97.3 F (36.3 C) (abnormal). His blood pressure is 132/70 and his pulse is 91. His respiration is 19 and oxygen saturation is 100%.   Wt Readings from Last 3 Encounters:  11/23/19 190 lb (86.2 kg)  11/10/19 190 lb (86.2 kg)  10/26/19 195 lb (88.5 kg)    Physical Exam Vitals reviewed.  HENT:     Head: Normocephalic and atraumatic.  Eyes:     Pupils: Pupils are equal, round, and reactive to light.  Cardiovascular:     Rate and Rhythm: Normal rate and regular rhythm.     Heart sounds: Normal heart sounds.  Pulmonary:     Effort: Pulmonary effort is normal.     Breath sounds: Normal breath sounds.  Abdominal:     General: Bowel sounds are normal.     Palpations: Abdomen is soft.     Comments: Abdominal exam shows a slightly obese abdomen.  Taylor Elliott does have an umbilical hernia.  I really cannot palpate his liver at this point.  There is no fluid wave.  There is no inguinal adenopathy.  There is no splenomegaly.    Musculoskeletal:        General: No tenderness or deformity. Normal range of motion.     Cervical back: Normal range of motion.  Lymphadenopathy:     Cervical: No cervical adenopathy.  Skin:    General: Skin is warm and dry.     Findings: No erythema or  rash.  Neurological:     Mental Status: Taylor Elliott is alert and oriented to person, place, and time.  Psychiatric:        Behavior: Behavior normal.        Thought Content: Thought content normal.        Judgment: Judgment normal.      Lab Results  Component Value Date   WBC 5.8 11/23/2019   HGB 9.8 (L) 11/23/2019   HCT 29.3 (L) 11/23/2019   MCV 94.8 11/23/2019   PLT 164 11/23/2019  Chemistry      Component Value Date/Time   NA 140 11/23/2019 0817   NA 135 (L) 07/12/2015 0813   K 4.1 11/23/2019 0817   K 4.5 07/12/2015 0813   CL 108 11/23/2019 0817   CO2 25 11/23/2019 0817   CO2 24 07/12/2015 0813   BUN 22 11/23/2019 0817   BUN 15.7 07/12/2015 0813   CREATININE 1.33 (H) 11/23/2019 0817   CREATININE 1.6 (H) 07/12/2015 0813      Component Value Date/Time   CALCIUM 9.4 11/23/2019 0817   CALCIUM 10.1 07/12/2015 0813   ALKPHOS 122 11/23/2019 0817   ALKPHOS 115 07/12/2015 0813   AST 12 (L) 11/23/2019 0817   AST 22 07/12/2015 0813   ALT 16 11/23/2019 0817   ALT 24 07/12/2015 0813   BILITOT 0.4 11/23/2019 0817   BILITOT 0.46 07/12/2015 0813       Impression and Plan: Taylor Elliott is a 69 year old white male.  Taylor Elliott has a past history of renal cell carcinoma and also prostate cancer.  Now, Taylor Elliott has a third malignancy.  This is metastatic small cell lung cancer.  It is recurrent.  I just hate the fact that Taylor Elliott is progressing.  Taylor Elliott is doing everything that Taylor Elliott needs to do.  I will now try him on single agent Taxotere.  This works in a different mechanism than what Taylor Elliott has had before.  Hopefully, we can get a response with Taxotere.  Taylor Elliott will need to have OnPro afterwards to help with his neutropenia with the Taxotere.    We will go ahead and try to plan for treatment on May 3.  I probably would do 3 cycles of treatment and then rescan him.  I will plan to see him back for the second cycle of Taxotere.          Volanda Napoleon, MD 4/26/20219:30 AM

## 2019-11-23 NOTE — Telephone Encounter (Signed)
Appointments scheduled per 4/26 los

## 2019-11-26 ENCOUNTER — Ambulatory Visit: Payer: Self-pay | Admitting: Radiation Oncology

## 2019-11-26 NOTE — Progress Notes (Signed)
Taylor Elliott presents today for follow-up after completing radiation to the brain on 09/22/2019.  Headaches started this past week intermittently rates 3-4 taking tylenol. Fatigue reports severe Skin (forehead, scalp, ears)no.( skin reddened from being at the beach.) Vision/Auditory changes? Had tube placed in left ear. Nausea occasional takes Ativan Fine motor skills : has lost some dexterity. Cognitive changes None Gait/Balance is poor. Uses cane, w/c and walker. Aphasia/Slurred speech No Decadron? No  Had brain MRI 11/30/19--here for results. Saw Dr. Marin Olp on 11/23/2019: "He has a past history of renal cell carcinoma and also prostate cancer.  Now, he has a third malignancy.  This is metastatic small cell lung cancer.  It is recurrent. I just hate the fact that he is progressing.  He is doing everything that he needs to do. I will now try him on single agent Taxotere."  Vitals:   12/03/19 0955  BP: 117/68  Pulse: (!) 106  Resp: 20  Temp: 98 F (36.7 C)  SpO2: 100%  Weight: 188 lb (85.3 kg)  Height: 5\' 8"  (1.727 m)      Wt Readings from Last 3 Encounters:  12/03/19 188 lb (85.3 kg)  11/23/19 190 lb (86.2 kg)  11/10/19 190 lb (86.2 kg)

## 2019-11-30 ENCOUNTER — Encounter (HOSPITAL_COMMUNITY): Payer: Self-pay

## 2019-11-30 ENCOUNTER — Other Ambulatory Visit: Payer: Self-pay | Admitting: Hematology & Oncology

## 2019-11-30 ENCOUNTER — Inpatient Hospital Stay: Payer: 59

## 2019-11-30 ENCOUNTER — Other Ambulatory Visit: Payer: Self-pay

## 2019-11-30 ENCOUNTER — Inpatient Hospital Stay: Payer: 59 | Attending: Hematology & Oncology

## 2019-11-30 ENCOUNTER — Ambulatory Visit (HOSPITAL_COMMUNITY)
Admission: RE | Admit: 2019-11-30 | Discharge: 2019-11-30 | Disposition: A | Discharge status: Home / Self Care | Payer: 59 | Source: Ambulatory Visit | Attending: Radiation Oncology | Admitting: Radiation Oncology

## 2019-11-30 VITALS — BP 163/86 | HR 91 | Temp 96.9°F | Resp 18

## 2019-11-30 DIAGNOSIS — Z5189 Encounter for other specified aftercare: Secondary | ICD-10-CM | POA: Diagnosis not present

## 2019-11-30 DIAGNOSIS — Z85528 Personal history of other malignant neoplasm of kidney: Secondary | ICD-10-CM | POA: Insufficient documentation

## 2019-11-30 DIAGNOSIS — Z7982 Long term (current) use of aspirin: Secondary | ICD-10-CM | POA: Diagnosis not present

## 2019-11-30 DIAGNOSIS — C7931 Secondary malignant neoplasm of brain: Secondary | ICD-10-CM | POA: Diagnosis not present

## 2019-11-30 DIAGNOSIS — Z5111 Encounter for antineoplastic chemotherapy: Secondary | ICD-10-CM | POA: Diagnosis not present

## 2019-11-30 DIAGNOSIS — L03119 Cellulitis of unspecified part of limb: Secondary | ICD-10-CM | POA: Insufficient documentation

## 2019-11-30 DIAGNOSIS — Z79899 Other long term (current) drug therapy: Secondary | ICD-10-CM | POA: Insufficient documentation

## 2019-11-30 DIAGNOSIS — Z8546 Personal history of malignant neoplasm of prostate: Secondary | ICD-10-CM | POA: Insufficient documentation

## 2019-11-30 DIAGNOSIS — C3491 Malignant neoplasm of unspecified part of right bronchus or lung: Secondary | ICD-10-CM | POA: Insufficient documentation

## 2019-11-30 DIAGNOSIS — C7949 Secondary malignant neoplasm of other parts of nervous system: Secondary | ICD-10-CM

## 2019-11-30 LAB — CBC WITH DIFFERENTIAL (CANCER CENTER ONLY)
Abs Immature Granulocytes: 0.02 10*3/uL (ref 0.00–0.07)
Basophils Absolute: 0 10*3/uL (ref 0.0–0.1)
Basophils Relative: 1 %
Eosinophils Absolute: 0.1 10*3/uL (ref 0.0–0.5)
Eosinophils Relative: 3 %
HCT: 29.8 % — ABNORMAL LOW (ref 39.0–52.0)
Hemoglobin: 9.7 g/dL — ABNORMAL LOW (ref 13.0–17.0)
Immature Granulocytes: 1 %
Lymphocytes Relative: 10 %
Lymphs Abs: 0.3 10*3/uL — ABNORMAL LOW (ref 0.7–4.0)
MCH: 31.5 pg (ref 26.0–34.0)
MCHC: 32.6 g/dL (ref 30.0–36.0)
MCV: 96.8 fL (ref 80.0–100.0)
Monocytes Absolute: 0.4 10*3/uL (ref 0.1–1.0)
Monocytes Relative: 12 %
Neutro Abs: 2.7 10*3/uL (ref 1.7–7.7)
Neutrophils Relative %: 73 %
Platelet Count: 152 10*3/uL (ref 150–400)
RBC: 3.08 MIL/uL — ABNORMAL LOW (ref 4.22–5.81)
RDW: 19.1 % — ABNORMAL HIGH (ref 11.5–15.5)
WBC Count: 3.6 10*3/uL — ABNORMAL LOW (ref 4.0–10.5)
nRBC: 0 % (ref 0.0–0.2)

## 2019-11-30 LAB — CMP (CANCER CENTER ONLY)
ALT: 31 U/L (ref 0–44)
AST: 22 U/L (ref 15–41)
Albumin: 3.3 g/dL — ABNORMAL LOW (ref 3.5–5.0)
Alkaline Phosphatase: 152 U/L — ABNORMAL HIGH (ref 38–126)
Anion gap: 7 (ref 5–15)
BUN: 31 mg/dL — ABNORMAL HIGH (ref 8–23)
CO2: 27 mmol/L (ref 22–32)
Calcium: 9.7 mg/dL (ref 8.9–10.3)
Chloride: 107 mmol/L (ref 98–111)
Creatinine: 1.62 mg/dL — ABNORMAL HIGH (ref 0.61–1.24)
GFR, Est AFR Am: 50 mL/min — ABNORMAL LOW (ref >60)
GFR, Estimated: 43 mL/min — ABNORMAL LOW (ref >60)
Glucose, Bld: 105 mg/dL — ABNORMAL HIGH (ref 70–99)
Potassium: 4.2 mmol/L (ref 3.5–5.1)
Sodium: 141 mmol/L (ref 135–145)
Total Bilirubin: 0.6 mg/dL (ref 0.3–1.2)
Total Protein: 5.6 g/dL — ABNORMAL LOW (ref 6.5–8.1)

## 2019-11-30 LAB — LACTATE DEHYDROGENASE: LDH: 222 U/L — ABNORMAL HIGH (ref 98–192)

## 2019-11-30 MED ORDER — PEGFILGRASTIM 6 MG/0.6ML ~~LOC~~ PSKT
PREFILLED_SYRINGE | SUBCUTANEOUS | Status: AC
  Filled 2019-11-30: qty 0.6

## 2019-11-30 MED ORDER — HEPARIN SOD (PORK) LOCK FLUSH 100 UNIT/ML IV SOLN
500.0000 [IU] | Freq: Once | INTRAVENOUS | Status: AC | PRN
  Administered 2019-11-30: 500 [IU]
  Filled 2019-11-30: qty 5

## 2019-11-30 MED ORDER — SODIUM CHLORIDE 0.9 % IV SOLN
Freq: Once | INTRAVENOUS | Status: AC
  Filled 2019-11-30: qty 250

## 2019-11-30 MED ORDER — PEGFILGRASTIM 6 MG/0.6ML ~~LOC~~ PSKT
6.0000 mg | PREFILLED_SYRINGE | Freq: Once | SUBCUTANEOUS | Status: AC
  Administered 2019-11-30: 6 mg via SUBCUTANEOUS

## 2019-11-30 MED ORDER — SODIUM CHLORIDE 0.9 % IV SOLN
10.0000 mg | Freq: Once | INTRAVENOUS | Status: AC
  Administered 2019-11-30: 10 mg via INTRAVENOUS
  Filled 2019-11-30: qty 10

## 2019-11-30 MED ORDER — SODIUM CHLORIDE 0.9 % IV SOLN
75.0000 mg/m2 | Freq: Once | INTRAVENOUS | Status: AC
  Administered 2019-11-30: 150 mg via INTRAVENOUS
  Filled 2019-11-30: qty 15

## 2019-11-30 MED ORDER — SODIUM CHLORIDE 0.9% FLUSH
10.0000 mL | INTRAVENOUS | Status: DC | PRN
  Administered 2019-11-30: 10 mL
  Filled 2019-11-30: qty 10

## 2019-11-30 MED FILL — SOLIFENACIN SUCCINATE 10 MG: 10 | 90 days supply | Qty: 90 | Fill #0

## 2019-11-30 MED FILL — TEMAZEPAM 30 MG CAPSULE: 30 | 30 days supply | Qty: 30 | Fill #0

## 2019-11-30 NOTE — Patient Instructions (Signed)
Implanted Port Insertion, Care After °This sheet gives you information about how to care for yourself after your procedure. Your health care provider may also give you more specific instructions. If you have problems or questions, contact your health care provider. °What can I expect after the procedure? °After the procedure, it is common to have: °· Discomfort at the port insertion site. °· Bruising on the skin over the port. This should improve over 3-4 days. °Follow these instructions at home: °Port care °· After your port is placed, you will get a manufacturer's information card. The card has information about your port. Keep this card with you at all times. °· Take care of the port as told by your health care provider. Ask your health care provider if you or a family member can get training for taking care of the port at home. A home health care nurse may also take care of the port. °· Make sure to remember what type of port you have. °Incision care ° °  ° °· Follow instructions from your health care provider about how to take care of your port insertion site. Make sure you: °? Wash your hands with soap and water before and after you change your bandage (dressing). If soap and water are not available, use hand sanitizer. °? Change your dressing as told by your health care provider. °? Leave stitches (sutures), skin glue, or adhesive strips in place. These skin closures may need to stay in place for 2 weeks or longer. If adhesive strip edges start to loosen and curl up, you may trim the loose edges. Do not remove adhesive strips completely unless your health care provider tells you to do that. °· Check your port insertion site every day for signs of infection. Check for: °? Redness, swelling, or pain. °? Fluid or blood. °? Warmth. °? Pus or a bad smell. °Activity °· Return to your normal activities as told by your health care provider. Ask your health care provider what activities are safe for you. °· Do not  lift anything that is heavier than 10 lb (4.5 kg), or the limit that you are told, until your health care provider says that it is safe. °General instructions °· Take over-the-counter and prescription medicines only as told by your health care provider. °· Do not take baths, swim, or use a hot tub until your health care provider approves. Ask your health care provider if you may take showers. You may only be allowed to take sponge baths. °· Do not drive for 24 hours if you were given a sedative during your procedure. °· Wear a medical alert bracelet in case of an emergency. This will tell any health care providers that you have a port. °· Keep all follow-up visits as told by your health care provider. This is important. °Contact a health care provider if: °· You cannot flush your port with saline as directed, or you cannot draw blood from the port. °· You have a fever or chills. °· You have redness, swelling, or pain around your port insertion site. °· You have fluid or blood coming from your port insertion site. °· Your port insertion site feels warm to the touch. °· You have pus or a bad smell coming from the port insertion site. °Get help right away if: °· You have chest pain or shortness of breath. °· You have bleeding from your port that you cannot control. °Summary °· Take care of the port as told by your health   care provider. Keep the manufacturer's information card with you at all times. °· Change your dressing as told by your health care provider. °· Contact a health care provider if you have a fever or chills or if you have redness, swelling, or pain around your port insertion site. °· Keep all follow-up visits as told by your health care provider. °This information is not intended to replace advice given to you by your health care provider. Make sure you discuss any questions you have with your health care provider. °Document Revised: 02/11/2018 Document Reviewed: 02/11/2018 °Elsevier Patient Education ©  2020 Elsevier Inc. ° °

## 2019-11-30 NOTE — Progress Notes (Signed)
Reviewed labwork with Dr. Marin Olp.  Ok to treat today per Dr Marin Olp.

## 2019-11-30 NOTE — Patient Instructions (Signed)
[facility] Discharge Instructions for Patients Receiving Chemotherapy  Today you received the following chemotherapy agents Taxotere.   To help prevent nausea and vomiting after your treatment, we encourage you to take your nausea medication.   If you develop nausea and vomiting that is not controlled by your nausea medication, call the clinic.   BELOW ARE SYMPTOMS THAT SHOULD BE REPORTED IMMEDIATELY:  *FEVER GREATER THAN 100.5 F  *CHILLS WITH OR WITHOUT FEVER  NAUSEA AND VOMITING THAT IS NOT CONTROLLED WITH YOUR NAUSEA MEDICATION  *UNUSUAL SHORTNESS OF BREATH  *UNUSUAL BRUISING OR BLEEDING  TENDERNESS IN MOUTH AND THROAT WITH OR WITHOUT PRESENCE OF ULCERS  *URINARY PROBLEMS  *BOWEL PROBLEMS  UNUSUAL RASH Items with * indicate a potential emergency and should be followed up as soon as possible.  Feel free to call the clinic should you have any questions or concerns. The clinic phone number is [phone].  Please show the CHEMO ALERT CARD at check-in to the Emergency Department and triage nurse.   

## 2019-12-01 ENCOUNTER — Encounter (HOSPITAL_COMMUNITY): Payer: Self-pay

## 2019-12-01 ENCOUNTER — Ambulatory Visit (HOSPITAL_COMMUNITY): Admission: RE | Admit: 2019-12-01 | Payer: 59 | Source: Ambulatory Visit

## 2019-12-02 ENCOUNTER — Other Ambulatory Visit: Payer: Self-pay

## 2019-12-02 ENCOUNTER — Ambulatory Visit (HOSPITAL_COMMUNITY)
Admission: RE | Admit: 2019-12-02 | Discharge: 2019-12-02 | Disposition: A | Discharge status: Home / Self Care | Payer: 59 | Source: Ambulatory Visit | Attending: Radiation Oncology | Admitting: Radiation Oncology

## 2019-12-02 ENCOUNTER — Ambulatory Visit: Payer: 59

## 2019-12-02 DIAGNOSIS — C3490 Malignant neoplasm of unspecified part of unspecified bronchus or lung: Secondary | ICD-10-CM | POA: Diagnosis not present

## 2019-12-02 DIAGNOSIS — C7949 Secondary malignant neoplasm of other parts of nervous system: Secondary | ICD-10-CM | POA: Diagnosis not present

## 2019-12-02 DIAGNOSIS — C7931 Secondary malignant neoplasm of brain: Secondary | ICD-10-CM | POA: Diagnosis not present

## 2019-12-02 MED ORDER — GADOBUTROL 1 MMOL/ML IV SOLN
10.0000 mL | Freq: Once | INTRAVENOUS | Status: AC | PRN
  Administered 2019-12-02: 8 mL via INTRAVENOUS

## 2019-12-02 NOTE — Progress Notes (Signed)
Radiation Oncology         [phone] ________________________________  Name: Taylor Elliott MRN: [id number]  Date: 12/03/2019  DOB: 07-30-1951  Follow-Up Visit Note  CC: Ann Held, DO  [name], MD    ICD-10-CM   1. Secondary malignant neoplasm of brain and spinal cord [facility])  C79.31 MR Brain W Wo Contrast   C79.49   2. Small cell lung cancer, right (HCC)  C34.91     Diagnosis:  Extensive stage small cell lung cancer  Interval Since Last Radiation: Two months, one week, and six days.  09/07/2019 through 09/22/2019 Site Technique Total Dose (Gy) Dose per Fx (Gy) Completed Fx Beam Energies  Brain: Brain Complex 20/20 2 10/10 6X   03/10/2019 through 03/27/2019 Site Technique Total Dose Dose per Fx Completed Fx Beam Energies  Brain: Brain Complex 35/35 2.5 14/14 6X    Narrative:  The patient returns today for routine follow-up. PET scan on 11/19/2019 showed interval progression in size and multiplicity of FDG avid liver metastases. There was also an increase in size and FDG uptake associated with upper abdominal FDG avid lymph nodes that were worrisome for metastasis. There was a similar appearance of FDG avid right hilar lymph node and right infrahilar nodule.  He was last seen by Dr. Marin Olp on 11/23/2019. The patient is status post six cycles of Carboplatinum/Etoposide/Tecentriq. Dr. Marin Olp switched the patient to single agent Taxotere on 11/30/2019 secondary to disease progression. This will be followed by OnPro for neutropenia.  MRI of brain on 11/30/2019 showed the previously noted enhancing metastatic deposits in the brain are no longer enhancing. Some of those remain visible on diffuse-weighted imaging, but there were no new lesions. It also showed some atrophy, chronic microvascular ischemic change, sinus mucosal disease, and bilateral mastoid effusions.  On review of systems, he reports intermittent headaches that are relieved with Tylenol, occasional  nausea, and severe fatigue. He denies cognitive changes.  He is unsteady with walking.  He did [place] completion of his radiation therapy.  He is frustrated with his overall fatigue.  ALLERGIES:  is allergic to bee venom and clindamycin/lincomycin.  Meds: Current Outpatient Medications  Medication Sig Dispense Refill  . amLODipine (NORVASC) 10 MG tablet Take 10 mg by mouth daily.    Marland Kitchen aspirin EC 81 MG tablet Take 81 mg by mouth daily.    . calcium carbonate (TUMS - DOSED IN MG ELEMENTAL CALCIUM) 500 MG chewable tablet Chew 2 tablets by mouth daily as needed for indigestion or heartburn.     . ciprofloxacin-dexamethasone (CIPRODEX) OTIC suspension Place 5 drops into both ears as needed.    . COMBIVENT RESPIMAT 20-100 MCG/ACT AERS respimat Inhale 1 puff into the lungs 2 (two) times daily. 4 g 4  . demeclocycline (DECLOMYCIN) 150 MG tablet TAKE 2 TABLETS BY MOUTH TWICE DAILY 120 tablet 2  . dronabinol (MARINOL) 5 MG capsule Take 1 capsule (5 mg total) by mouth 2 (two) times daily before a meal. (Patient not taking: Reported on 11/23/2019) 60 capsule 1  . EPINEPHrine 0.3 mg/0.3 mL IJ SOAJ injection Inject 0.3 mg into the muscle once.    . fenofibrate 160 MG tablet TAKE 1 TABLET (160 MG TOTAL) BY MOUTH DAILY. 90 tablet 1  . HYDROcodone-homatropine (HYCODAN) 5-1.5 MG/5ML syrup Take 5 mLs by mouth every 4 (four) hours as needed for cough. 300 mL 0  . lactulose (CHRONULAC) 10 GM/15ML solution Take  15 mLs (10 g total) by mouth 2 (two) times daily as needed for mild constipation. 473 mL 1  . LORazepam (ATIVAN) 1 MG tablet Take 1 tablet (1 mg total) by mouth every 6 (six) hours as needed for anxiety. 60 tablet 0  . Multiple Vitamin (MULTIVITAMIN) tablet Take 1 tablet by mouth daily.    Marland Kitchen pyridoxine (B-6) 200 MG tablet Take 200 mg by mouth daily.    . solifenacin (VESICARE) 10 MG tablet Take 10 mg by mouth every evening.    . temazepam (RESTORIL) 30 MG capsule TAKE 1  CAPSULE (30 MG TOTAL) BY MOUTH AT BEDTIME AS NEEDED FOR SLEEP. 30 capsule 2  . triamcinolone (NASACORT) 55 MCG/ACT AERO nasal inhaler     . UNABLE TO FIND Med Name: Allergy shots.     No current facility-administered medications for this encounter.    Physical Findings: The patient is in no acute distress. Patient is alert and oriented.  He continues to have good recall of his treatment, medications  height is 5\' 8"  (1.727 m) and weight is 188 lb (85.3 kg). His temperature is 98 F (36.7 C). His blood pressure is 117/68 and his pulse is 106 (abnormal). His respiration is 20 and oxygen saturation is 100%.  Lungs are clear to auscultation bilaterally. Heart has regular rate and rhythm. No palpable cervical, supraclavicular, or axillary adenopathy. Abdomen soft, non-tender, normal bowel sounds. Ambulates with the assistance of a cane/ walker which is his baseline.  Using wheelchair much more at this time.   Lab Findings: Lab Results  Component Value Date   WBC 3.6 (L) 11/30/2019   HGB 9.7 (L) 11/30/2019   HCT 29.8 (L) 11/30/2019   MCV 96.8 11/30/2019   PLT 152 11/30/2019    Radiographic Findings: MR Brain W Wo Contrast  Result Date: 12/02/2019 CLINICAL DATA:  Small cell lung cancer with metastatic disease to brain. Assess treatment response. EXAM: MRI HEAD WITHOUT AND WITH CONTRAST TECHNIQUE: Multiplanar, multiecho pulse sequences of the brain and surrounding structures were obtained without and with intravenous contrast. CONTRAST:  20mL GADAVIST GADOBUTROL 1 MMOL/ML IV SOLN COMPARISON:  MRI head 09/01/2019 FINDINGS: Brain: Multiple enhancing lesions in the brain seen on the prior study no longer enhance. No new lesions. Many of the previously noted lesions show restricted diffusion on diffusion-weighted imaging as noted previously. Generalized atrophy without hydrocephalus. Chronic microvascular ischemic changes in the white matter and pons. Left parietal arachnoid cyst 26 x 31 mm unchanged.  Vascular: Normal arterial flow voids Skull and upper cervical spine: No focal skeletal lesion. Sinuses/Orbits: Mild mucosal edema paranasal sinuses. Bilateral cataract surgery. Scleral buckle on the right. Other: None IMPRESSION: Previously noted enhancing metastatic deposits brain no longer enhance. Some of these remain visible on diffusion-weighted imaging. No new lesions. Atrophy and chronic microvascular ischemic change Sinus mucosal disease and bilateral mastoid effusions. Electronically Signed   By: Franchot Gallo M.D.   On: 12/02/2019 12:34   NM PET Image Restag (PS) Skull Base To Thigh  Result Date: 11/19/2019 CLINICAL DATA:  Subsequent treatment strategy for small cell lung cancer. EXAM: NUCLEAR MEDICINE PET SKULL BASE TO THIGH TECHNIQUE: 9.48 mCi F-18 FDG was injected intravenously. Full-ring PET imaging was performed from the skull base to thigh after the radiotracer. CT data was obtained and used for attenuation correction and anatomic localization. Fasting blood glucose: 96 mg/dl COMPARISON:  08/10/2019 FINDINGS: Mediastinal blood pool activity: SUV max 3.17 Liver activity: SUV max NA NECK: There is no FDG avid lymph  node or mass in the soft tissues of the neck. Incidental CT findings: none CHEST: Index right hilar lymph node has an SUV max of 6.9. This is compared with 4.7 previously. The index right infrahilar nodule has an SUV max of 7.19. Previously 9.46. No new areas of increased FDG uptake identified. Incidental CT findings: Centrilobular emphysema. Aortic atherosclerosis. ABDOMEN/PELVIS: Multiple FDG avid liver metastases are again noted: -index lesion within posterior right hepatic lobe measures 4.1 cm and has an SUV max of 13.2. Previously 3 cm within SUV max of 14.4. -index lesion within segment 2 scratch index lesion within segment 8 measures 3.2 cm within SUV max of 16.38. Previously 1.1 cm within SUV max of 13.6. -the new lesion in segment 7/8 measures 1 cm and has an SUV max of 6.5.  -new lesion within segment 7, posteriorly, measures 1.2 cm within SUV max of 10.4. No abnormal uptake within the pancreas, spleen, or adrenal glands. Previous right nephrectomy. FDG avid portacaval lymph node measures 1.1 cm short axis and has an SUV max of 9.29. This is compared with 0.7 cm previously with an SUV max of 5.01. Two additional FDG avid portacaval lymph nodes are identified within SUV max of 6.6. This is new from previous exam. Incidental CT findings: Aortic atherosclerosis without aneurysm. Previous right nephrectomy. Fat containing umbilical hernia identified. SKELETON: There is been interval increase in diffuse bone marrow activity throughout the axial and appendicular skeleton which is favored to represent treatment related changes. Incidental CT findings: none IMPRESSION: 1. Interval progression in size and multiplicity of FDG avid liver metastases. 2. Increase in size and FDG uptake associated with upper abdominal FDG avid lymph nodes worrisome for metastasis. 3. Similar appearance of FDG avid right hilar lymph node and right infrahilar nodule. Electronically Signed   By: Kerby Moors M.D.   On: 11/19/2019 16:02    Impression:  Extensive stage small cell lung cancer  Patient has had a good response to his retreatment to the brain.  He unfortunately has systemic progression and has been switched to single agent Taxotere at this time  Plan: The patient is scheduled to see Dr. Marin Olp on 12/21/2019. Follow-up with radiation oncology in 3 months after the patient's brain MRI.  ____________________________________   Blair Promise, PhD, MD  This document serves as a record of services personally performed by Gery Pray, MD. It was created on his behalf by Clerance Lav, a trained medical scribe. The creation of this record is based on the scribe's personal observations and the provider's statements to them. This document has been checked and approved by the attending provider.

## 2019-12-03 ENCOUNTER — Other Ambulatory Visit: Payer: Self-pay

## 2019-12-03 ENCOUNTER — Encounter: Payer: Self-pay | Admitting: Hematology & Oncology

## 2019-12-03 ENCOUNTER — Encounter: Payer: Self-pay | Admitting: Radiation Oncology

## 2019-12-03 ENCOUNTER — Ambulatory Visit
Admission: RE | Admit: 2019-12-03 | Discharge: 2019-12-03 | Disposition: A | Discharge status: Home / Self Care | Payer: 59 | Source: Ambulatory Visit | Attending: Radiation Oncology | Admitting: Radiation Oncology

## 2019-12-03 VITALS — BP 117/68 | HR 106 | Temp 98.0°F | Resp 20 | Ht 68.0 in | Wt 188.0 lb

## 2019-12-03 DIAGNOSIS — C7931 Secondary malignant neoplasm of brain: Secondary | ICD-10-CM | POA: Diagnosis not present

## 2019-12-03 DIAGNOSIS — C3401 Malignant neoplasm of right main bronchus: Secondary | ICD-10-CM | POA: Insufficient documentation

## 2019-12-03 DIAGNOSIS — Z87891 Personal history of nicotine dependence: Secondary | ICD-10-CM | POA: Diagnosis not present

## 2019-12-03 DIAGNOSIS — Z7982 Long term (current) use of aspirin: Secondary | ICD-10-CM | POA: Insufficient documentation

## 2019-12-03 DIAGNOSIS — C7951 Secondary malignant neoplasm of bone: Secondary | ICD-10-CM | POA: Diagnosis not present

## 2019-12-03 DIAGNOSIS — C787 Secondary malignant neoplasm of liver and intrahepatic bile duct: Secondary | ICD-10-CM | POA: Diagnosis not present

## 2019-12-03 DIAGNOSIS — I700 Atherosclerosis of aorta: Secondary | ICD-10-CM | POA: Insufficient documentation

## 2019-12-03 DIAGNOSIS — Z79899 Other long term (current) drug therapy: Secondary | ICD-10-CM | POA: Diagnosis not present

## 2019-12-03 DIAGNOSIS — K429 Umbilical hernia without obstruction or gangrene: Secondary | ICD-10-CM | POA: Insufficient documentation

## 2019-12-03 DIAGNOSIS — R600 Localized edema: Secondary | ICD-10-CM | POA: Insufficient documentation

## 2019-12-03 DIAGNOSIS — C7949 Secondary malignant neoplasm of other parts of nervous system: Secondary | ICD-10-CM | POA: Insufficient documentation

## 2019-12-03 DIAGNOSIS — C3491 Malignant neoplasm of unspecified part of right bronchus or lung: Secondary | ICD-10-CM

## 2019-12-04 ENCOUNTER — Other Ambulatory Visit: Payer: Self-pay | Admitting: *Deleted

## 2019-12-04 DIAGNOSIS — C3491 Malignant neoplasm of unspecified part of right bronchus or lung: Secondary | ICD-10-CM

## 2019-12-04 DIAGNOSIS — E871 Hypo-osmolality and hyponatremia: Secondary | ICD-10-CM

## 2019-12-07 ENCOUNTER — Inpatient Hospital Stay: Payer: 59

## 2019-12-07 ENCOUNTER — Other Ambulatory Visit: Payer: Self-pay

## 2019-12-07 VITALS — BP 124/76 | HR 108 | Temp 97.1°F | Resp 20

## 2019-12-07 DIAGNOSIS — Z8546 Personal history of malignant neoplasm of prostate: Secondary | ICD-10-CM | POA: Diagnosis not present

## 2019-12-07 DIAGNOSIS — E871 Hypo-osmolality and hyponatremia: Secondary | ICD-10-CM

## 2019-12-07 DIAGNOSIS — C3491 Malignant neoplasm of unspecified part of right bronchus or lung: Secondary | ICD-10-CM | POA: Diagnosis present

## 2019-12-07 DIAGNOSIS — L03119 Cellulitis of unspecified part of limb: Secondary | ICD-10-CM | POA: Diagnosis not present

## 2019-12-07 DIAGNOSIS — C7931 Secondary malignant neoplasm of brain: Secondary | ICD-10-CM | POA: Diagnosis not present

## 2019-12-07 DIAGNOSIS — Z5189 Encounter for other specified aftercare: Secondary | ICD-10-CM | POA: Diagnosis not present

## 2019-12-07 DIAGNOSIS — Z5111 Encounter for antineoplastic chemotherapy: Secondary | ICD-10-CM | POA: Diagnosis not present

## 2019-12-07 DIAGNOSIS — Z85528 Personal history of other malignant neoplasm of kidney: Secondary | ICD-10-CM | POA: Diagnosis not present

## 2019-12-07 DIAGNOSIS — Z79899 Other long term (current) drug therapy: Secondary | ICD-10-CM | POA: Diagnosis not present

## 2019-12-07 DIAGNOSIS — Z7982 Long term (current) use of aspirin: Secondary | ICD-10-CM | POA: Diagnosis not present

## 2019-12-07 LAB — CMP (CANCER CENTER ONLY)
ALT: 20 U/L (ref 0–44)
AST: 22 U/L (ref 15–41)
Albumin: 3.1 g/dL — ABNORMAL LOW (ref 3.5–5.0)
Alkaline Phosphatase: 145 U/L — ABNORMAL HIGH (ref 38–126)
Anion gap: 9 (ref 5–15)
BUN: 24 mg/dL — ABNORMAL HIGH (ref 8–23)
CO2: 27 mmol/L (ref 22–32)
Calcium: 9.7 mg/dL (ref 8.9–10.3)
Chloride: 105 mmol/L (ref 98–111)
Creatinine: 1.68 mg/dL — ABNORMAL HIGH (ref 0.61–1.24)
GFR, Est AFR Am: 48 mL/min — ABNORMAL LOW (ref >60)
GFR, Estimated: 41 mL/min — ABNORMAL LOW (ref >60)
Glucose, Bld: 121 mg/dL — ABNORMAL HIGH (ref 70–99)
Potassium: 3.8 mmol/L (ref 3.5–5.1)
Sodium: 141 mmol/L (ref 135–145)
Total Bilirubin: 0.4 mg/dL (ref 0.3–1.2)
Total Protein: 5.4 g/dL — ABNORMAL LOW (ref 6.5–8.1)

## 2019-12-07 LAB — CBC WITH DIFFERENTIAL (CANCER CENTER ONLY)
Abs Immature Granulocytes: 1.55 10*3/uL — ABNORMAL HIGH (ref 0.00–0.07)
Basophils Absolute: 0.1 10*3/uL (ref 0.0–0.1)
Basophils Relative: 1 %
Eosinophils Absolute: 0 10*3/uL (ref 0.0–0.5)
Eosinophils Relative: 0 %
HCT: 27.5 % — ABNORMAL LOW (ref 39.0–52.0)
Hemoglobin: 8.9 g/dL — ABNORMAL LOW (ref 13.0–17.0)
Immature Granulocytes: 17 %
Lymphocytes Relative: 5 %
Lymphs Abs: 0.4 10*3/uL — ABNORMAL LOW (ref 0.7–4.0)
MCH: 31.3 pg (ref 26.0–34.0)
MCHC: 32.4 g/dL (ref 30.0–36.0)
MCV: 96.8 fL (ref 80.0–100.0)
Monocytes Absolute: 1.4 10*3/uL — ABNORMAL HIGH (ref 0.1–1.0)
Monocytes Relative: 16 %
Neutro Abs: 5.7 10*3/uL (ref 1.7–7.7)
Neutrophils Relative %: 61 %
Platelet Count: 80 10*3/uL — ABNORMAL LOW (ref 150–400)
RBC: 2.84 MIL/uL — ABNORMAL LOW (ref 4.22–5.81)
RDW: 18.7 % — ABNORMAL HIGH (ref 11.5–15.5)
WBC Count: 9.3 10*3/uL (ref 4.0–10.5)
nRBC: 0.2 % (ref 0.0–0.2)

## 2019-12-07 MED ORDER — HEPARIN SOD (PORK) LOCK FLUSH 100 UNIT/ML IV SOLN
500.0000 [IU] | Freq: Once | INTRAVENOUS | Status: AC | PRN
  Administered 2019-12-07: 500 [IU]
  Filled 2019-12-07: qty 5

## 2019-12-07 MED ORDER — SODIUM CHLORIDE 0.9 % IV SOLN
Freq: Once | INTRAVENOUS | Status: AC
  Filled 2019-12-07: qty 250

## 2019-12-07 MED ORDER — SODIUM CHLORIDE 0.9% FLUSH
10.0000 mL | Freq: Once | INTRAVENOUS | Status: AC | PRN
  Administered 2019-12-07: 10 mL
  Filled 2019-12-07: qty 10

## 2019-12-10 DIAGNOSIS — T63451D Toxic effect of venom of hornets, accidental (unintentional), subsequent encounter: Secondary | ICD-10-CM | POA: Diagnosis not present

## 2019-12-10 DIAGNOSIS — T63461D Toxic effect of venom of wasps, accidental (unintentional), subsequent encounter: Secondary | ICD-10-CM | POA: Diagnosis not present

## 2019-12-11 ENCOUNTER — Other Ambulatory Visit: Payer: Self-pay | Admitting: Hematology & Oncology

## 2019-12-11 ENCOUNTER — Other Ambulatory Visit: Payer: Self-pay | Admitting: *Deleted

## 2019-12-11 DIAGNOSIS — C3491 Malignant neoplasm of unspecified part of right bronchus or lung: Secondary | ICD-10-CM

## 2019-12-11 DIAGNOSIS — C649 Malignant neoplasm of unspecified kidney, except renal pelvis: Secondary | ICD-10-CM

## 2019-12-11 DIAGNOSIS — R059 Cough, unspecified: Secondary | ICD-10-CM

## 2019-12-11 MED ORDER — HYDROCODONE-HOMATROPINE 5-1.5 MG/5ML PO SYRP: 5.0000 mL | ORAL_SOLUTION | ORAL | 0 refills | Status: DC | PRN

## 2019-12-11 MED ORDER — LORAZEPAM 1 MG PO TABS: 1.0000 mg | ORAL_TABLET | Freq: Four times a day (QID) | ORAL | 0 refills | Status: DC | PRN

## 2019-12-14 ENCOUNTER — Inpatient Hospital Stay: Payer: 59

## 2019-12-14 ENCOUNTER — Other Ambulatory Visit: Payer: Self-pay

## 2019-12-14 VITALS — BP 154/77 | HR 98 | Temp 97.5°F | Resp 18

## 2019-12-14 DIAGNOSIS — Z79899 Other long term (current) drug therapy: Secondary | ICD-10-CM | POA: Diagnosis not present

## 2019-12-14 DIAGNOSIS — Z8546 Personal history of malignant neoplasm of prostate: Secondary | ICD-10-CM | POA: Diagnosis not present

## 2019-12-14 DIAGNOSIS — Z85528 Personal history of other malignant neoplasm of kidney: Secondary | ICD-10-CM | POA: Diagnosis not present

## 2019-12-14 DIAGNOSIS — C3491 Malignant neoplasm of unspecified part of right bronchus or lung: Secondary | ICD-10-CM | POA: Diagnosis not present

## 2019-12-14 DIAGNOSIS — C649 Malignant neoplasm of unspecified kidney, except renal pelvis: Secondary | ICD-10-CM

## 2019-12-14 DIAGNOSIS — L03119 Cellulitis of unspecified part of limb: Secondary | ICD-10-CM

## 2019-12-14 DIAGNOSIS — Z7982 Long term (current) use of aspirin: Secondary | ICD-10-CM | POA: Diagnosis not present

## 2019-12-14 DIAGNOSIS — Z5189 Encounter for other specified aftercare: Secondary | ICD-10-CM | POA: Diagnosis not present

## 2019-12-14 DIAGNOSIS — Z5111 Encounter for antineoplastic chemotherapy: Secondary | ICD-10-CM | POA: Diagnosis not present

## 2019-12-14 DIAGNOSIS — C7931 Secondary malignant neoplasm of brain: Secondary | ICD-10-CM | POA: Diagnosis not present

## 2019-12-14 LAB — CBC WITH DIFFERENTIAL (CANCER CENTER ONLY)
Abs Immature Granulocytes: 0.6 10*3/uL — ABNORMAL HIGH (ref 0.00–0.07)
Basophils Absolute: 0.1 10*3/uL (ref 0.0–0.1)
Basophils Relative: 1 %
Eosinophils Absolute: 0 10*3/uL (ref 0.0–0.5)
Eosinophils Relative: 0 %
HCT: 27.5 % — ABNORMAL LOW (ref 39.0–52.0)
Hemoglobin: 8.8 g/dL — ABNORMAL LOW (ref 13.0–17.0)
Immature Granulocytes: 6 %
Lymphocytes Relative: 4 %
Lymphs Abs: 0.4 10*3/uL — ABNORMAL LOW (ref 0.7–4.0)
MCH: 31.5 pg (ref 26.0–34.0)
MCHC: 32 g/dL (ref 30.0–36.0)
MCV: 98.6 fL (ref 80.0–100.0)
Monocytes Absolute: 0.7 10*3/uL (ref 0.1–1.0)
Monocytes Relative: 8 %
Neutro Abs: 7.8 10*3/uL — ABNORMAL HIGH (ref 1.7–7.7)
Neutrophils Relative %: 81 %
Platelet Count: 187 10*3/uL (ref 150–400)
RBC: 2.79 MIL/uL — ABNORMAL LOW (ref 4.22–5.81)
RDW: 19 % — ABNORMAL HIGH (ref 11.5–15.5)
WBC Count: 9.6 10*3/uL (ref 4.0–10.5)
nRBC: 0.4 % — ABNORMAL HIGH (ref 0.0–0.2)

## 2019-12-14 LAB — CMP (CANCER CENTER ONLY)
ALT: 17 U/L (ref 0–44)
AST: 17 U/L (ref 15–41)
Albumin: 3.2 g/dL — ABNORMAL LOW (ref 3.5–5.0)
Alkaline Phosphatase: 138 U/L — ABNORMAL HIGH (ref 38–126)
Anion gap: 8 (ref 5–15)
BUN: 20 mg/dL (ref 8–23)
CO2: 29 mmol/L (ref 22–32)
Calcium: 9.3 mg/dL (ref 8.9–10.3)
Chloride: 106 mmol/L (ref 98–111)
Creatinine: 1.54 mg/dL — ABNORMAL HIGH (ref 0.61–1.24)
GFR, Est AFR Am: 53 mL/min — ABNORMAL LOW (ref >60)
GFR, Estimated: 46 mL/min — ABNORMAL LOW (ref >60)
Glucose, Bld: 112 mg/dL — ABNORMAL HIGH (ref 70–99)
Potassium: 4 mmol/L (ref 3.5–5.1)
Sodium: 143 mmol/L (ref 135–145)
Total Bilirubin: 0.5 mg/dL (ref 0.3–1.2)
Total Protein: 5.3 g/dL — ABNORMAL LOW (ref 6.5–8.1)

## 2019-12-14 MED ORDER — HEPARIN SOD (PORK) LOCK FLUSH 100 UNIT/ML IV SOLN
500.0000 [IU] | Freq: Once | INTRAVENOUS | Status: AC | PRN
  Administered 2019-12-14: 500 [IU]
  Filled 2019-12-14: qty 5

## 2019-12-14 MED ORDER — SODIUM CHLORIDE 0.9% FLUSH
10.0000 mL | Freq: Once | INTRAVENOUS | Status: AC | PRN
  Administered 2019-12-14: 10 mL
  Filled 2019-12-14: qty 10

## 2019-12-14 MED ORDER — SODIUM CHLORIDE 0.9 % IV SOLN
Freq: Once | INTRAVENOUS | Status: AC
  Filled 2019-12-14: qty 250

## 2019-12-14 NOTE — Progress Notes (Signed)
Pharmacist Chemotherapy Monitoring - Follow Up Assessment    I verify that I have reviewed each item in the below checklist:  . Regimen for the patient is scheduled for the appropriate day and plan matches scheduled date. Marland Kitchen Appropriate non-routine labs are ordered dependent on drug ordered. . If applicable, additional medications reviewed and ordered per protocol based on lifetime cumulative doses and/or treatment regimen.   Plan for follow-up and/or issues identified: No . I-vent associated with next due treatment: No . MD and/or nursing notified: No   Kennith Center, Pharm.D., CPP 12/14/2019@2 :59 PM

## 2019-12-18 MED FILL — DEMECLOCYCLINE 150 MG TAB: 150 | 30 days supply | Qty: 120 | Fill #1

## 2019-12-21 ENCOUNTER — Inpatient Hospital Stay: Payer: 59

## 2019-12-21 ENCOUNTER — Other Ambulatory Visit: Payer: Self-pay

## 2019-12-21 ENCOUNTER — Encounter: Payer: Self-pay | Admitting: Hematology & Oncology

## 2019-12-21 ENCOUNTER — Other Ambulatory Visit: Payer: Self-pay | Admitting: Family

## 2019-12-21 ENCOUNTER — Inpatient Hospital Stay (HOSPITAL_BASED_OUTPATIENT_CLINIC_OR_DEPARTMENT_OTHER): Payer: 59 | Admitting: Hematology & Oncology

## 2019-12-21 ENCOUNTER — Other Ambulatory Visit: Payer: Self-pay | Admitting: *Deleted

## 2019-12-21 ENCOUNTER — Telehealth: Payer: Self-pay | Admitting: Hematology & Oncology

## 2019-12-21 VITALS — BP 103/62 | HR 86 | Temp 97.1°F | Resp 16 | Wt 193.0 lb

## 2019-12-21 DIAGNOSIS — C7931 Secondary malignant neoplasm of brain: Secondary | ICD-10-CM | POA: Diagnosis not present

## 2019-12-21 DIAGNOSIS — C3491 Malignant neoplasm of unspecified part of right bronchus or lung: Secondary | ICD-10-CM

## 2019-12-21 DIAGNOSIS — Z5189 Encounter for other specified aftercare: Secondary | ICD-10-CM | POA: Diagnosis not present

## 2019-12-21 DIAGNOSIS — Z79899 Other long term (current) drug therapy: Secondary | ICD-10-CM | POA: Diagnosis not present

## 2019-12-21 DIAGNOSIS — L03119 Cellulitis of unspecified part of limb: Secondary | ICD-10-CM | POA: Diagnosis not present

## 2019-12-21 DIAGNOSIS — Z7982 Long term (current) use of aspirin: Secondary | ICD-10-CM | POA: Diagnosis not present

## 2019-12-21 DIAGNOSIS — D649 Anemia, unspecified: Secondary | ICD-10-CM

## 2019-12-21 DIAGNOSIS — Z5111 Encounter for antineoplastic chemotherapy: Secondary | ICD-10-CM | POA: Diagnosis not present

## 2019-12-21 DIAGNOSIS — Z85528 Personal history of other malignant neoplasm of kidney: Secondary | ICD-10-CM | POA: Diagnosis not present

## 2019-12-21 DIAGNOSIS — Z8546 Personal history of malignant neoplasm of prostate: Secondary | ICD-10-CM | POA: Diagnosis not present

## 2019-12-21 LAB — CMP (CANCER CENTER ONLY)
ALT: 14 U/L (ref 0–44)
AST: 18 U/L (ref 15–41)
Albumin: 3.3 g/dL — ABNORMAL LOW (ref 3.5–5.0)
Alkaline Phosphatase: 105 U/L (ref 38–126)
Anion gap: 7 (ref 5–15)
BUN: 18 mg/dL (ref 8–23)
CO2: 26 mmol/L (ref 22–32)
Calcium: 9.5 mg/dL (ref 8.9–10.3)
Chloride: 101 mmol/L (ref 98–111)
Creatinine: 1.43 mg/dL — ABNORMAL HIGH (ref 0.61–1.24)
GFR, Est AFR Am: 58 mL/min — ABNORMAL LOW (ref >60)
GFR, Estimated: 50 mL/min — ABNORMAL LOW (ref >60)
Glucose, Bld: 116 mg/dL — ABNORMAL HIGH (ref 70–99)
Potassium: 4.2 mmol/L (ref 3.5–5.1)
Sodium: 134 mmol/L — ABNORMAL LOW (ref 135–145)
Total Bilirubin: 0.5 mg/dL (ref 0.3–1.2)
Total Protein: 5.9 g/dL — ABNORMAL LOW (ref 6.5–8.1)

## 2019-12-21 LAB — CBC WITH DIFFERENTIAL (CANCER CENTER ONLY)
Abs Immature Granulocytes: 0.07 10*3/uL (ref 0.00–0.07)
Basophils Absolute: 0.1 10*3/uL (ref 0.0–0.1)
Basophils Relative: 1 %
Eosinophils Absolute: 0 10*3/uL (ref 0.0–0.5)
Eosinophils Relative: 0 %
HCT: 26.8 % — ABNORMAL LOW (ref 39.0–52.0)
Hemoglobin: 8.7 g/dL — ABNORMAL LOW (ref 13.0–17.0)
Immature Granulocytes: 1 %
Lymphocytes Relative: 5 %
Lymphs Abs: 0.3 10*3/uL — ABNORMAL LOW (ref 0.7–4.0)
MCH: 32.5 pg (ref 26.0–34.0)
MCHC: 32.5 g/dL (ref 30.0–36.0)
MCV: 100 fL (ref 80.0–100.0)
Monocytes Absolute: 0.7 10*3/uL (ref 0.1–1.0)
Monocytes Relative: 10 %
Neutro Abs: 5.5 10*3/uL (ref 1.7–7.7)
Neutrophils Relative %: 83 %
Platelet Count: 216 10*3/uL (ref 150–400)
RBC: 2.68 MIL/uL — ABNORMAL LOW (ref 4.22–5.81)
RDW: 18.9 % — ABNORMAL HIGH (ref 11.5–15.5)
WBC Count: 6.6 10*3/uL (ref 4.0–10.5)
nRBC: 0 % (ref 0.0–0.2)

## 2019-12-21 LAB — SAMPLE TO BLOOD BANK

## 2019-12-21 LAB — PREPARE RBC (CROSSMATCH)

## 2019-12-21 LAB — LACTATE DEHYDROGENASE: LDH: 250 U/L — ABNORMAL HIGH (ref 98–192)

## 2019-12-21 MED ORDER — PEGFILGRASTIM 6 MG/0.6ML ~~LOC~~ PSKT
PREFILLED_SYRINGE | SUBCUTANEOUS | Status: AC
  Filled 2019-12-21: qty 0.6

## 2019-12-21 MED ORDER — HEPARIN SOD (PORK) LOCK FLUSH 100 UNIT/ML IV SOLN
250.0000 [IU] | Freq: Once | INTRAVENOUS | Status: DC | PRN
  Filled 2019-12-21: qty 5

## 2019-12-21 MED ORDER — SODIUM CHLORIDE 0.9 % IV SOLN
67.5000 mg/m2 | Freq: Once | INTRAVENOUS | Status: AC
  Administered 2019-12-21: 140 mg via INTRAVENOUS
  Filled 2019-12-21: qty 14

## 2019-12-21 MED ORDER — HEPARIN SOD (PORK) LOCK FLUSH 100 UNIT/ML IV SOLN
500.0000 [IU] | Freq: Once | INTRAVENOUS | Status: AC | PRN
  Administered 2019-12-21: 500 [IU]
  Filled 2019-12-21: qty 5

## 2019-12-21 MED ORDER — SODIUM CHLORIDE 0.9 % IV SOLN
Freq: Once | INTRAVENOUS | Status: AC
  Filled 2019-12-21: qty 250

## 2019-12-21 MED ORDER — SODIUM CHLORIDE 0.9% FLUSH
10.0000 mL | Freq: Once | INTRAVENOUS | Status: AC | PRN
  Administered 2019-12-21: 10 mL
  Filled 2019-12-21: qty 10

## 2019-12-21 MED ORDER — SODIUM CHLORIDE 0.9 % IV SOLN
Freq: Once | INTRAVENOUS | Status: DC
  Filled 2019-12-21: qty 250

## 2019-12-21 MED ORDER — PEGFILGRASTIM 6 MG/0.6ML ~~LOC~~ PSKT
6.0000 mg | PREFILLED_SYRINGE | Freq: Once | SUBCUTANEOUS | Status: AC
  Administered 2019-12-21: 6 mg via SUBCUTANEOUS

## 2019-12-21 MED ORDER — SODIUM CHLORIDE 0.9 % IV SOLN
10.0000 mg | Freq: Once | INTRAVENOUS | Status: AC
  Administered 2019-12-21: 10 mg via INTRAVENOUS
  Filled 2019-12-21: qty 10

## 2019-12-21 NOTE — Progress Notes (Signed)
Hematology and Oncology Follow Up Visit  Taylor Elliott [id number] 23-Jan-1951 69 y.o. 12/21/2019   Principle Diagnosis:   Extensive stage small cell lung cancer  SIADH secondary to small cell lung cancer  Current Therapy:           Carboplatinum/etoposide/Tecentriq-cycle #6  Atezolizumab 1200 mg IV q 3 week -- maintenance -  Start 01/21/2019  XRT for CNS mets -- completed on 03/27/2019  Lurbinectedin -- s/p cycle #4-- started on 05/07/2019 -- d/c on 09/03/2019 due to progression  CDDP/Irinotecan -- s/p cycle #3- started on 09/11/2019 -- d/c on 12/16/2019  Taxotere -- s/p cycle #1 on 11/30/2019     Interim History:  Mr. Bensinger is back for follow-up.  Thankfully, when he had the MRI of the brain on 12/02/2019, there was no obvious CNS metastasis.  He has for cycle of Taxotere 3 weeks ago.  He seemed to do pretty well with this.  He did have some bony pain from the Neulasta.  I told him to try some Claritin as this may help with some of that discomfort.  His blood sodium has been doing quite well.  As such were trying to cut back on the demeclocycline.  He now is taking 1 p.o. twice daily.  Hopefully, he can continue to pull back on this.  It does cause a lot of sun sensitivity for him.  He is eating pretty well.  He has had no problems with diarrhea.  There is no problems with cough or shortness of breath.  He still does smoke a little bit.  Overall, I would say his performance status is ECOG 1.      Medications:  Current Outpatient Medications:  .  amLODipine (NORVASC) 10 MG tablet, Take 10 mg by mouth daily., Disp: , Rfl:  .  aspirin EC 81 MG tablet, Take 81 mg by mouth daily., Disp: , Rfl:  .  calcium carbonate (TUMS - DOSED IN MG ELEMENTAL CALCIUM) 500 MG chewable tablet, Chew 2 tablets by mouth daily as needed for indigestion or heartburn. , Disp: , Rfl:  .  ciprofloxacin-dexamethasone (CIPRODEX) OTIC suspension, Place 5 drops into both ears as needed., Disp: , Rfl:  .   COMBIVENT RESPIMAT 20-100 MCG/ACT AERS respimat, Inhale 1 puff into the lungs 2 (two) times daily., Disp: 4 g, Rfl: 4 .  demeclocycline (DECLOMYCIN) 150 MG tablet, TAKE 2 TABLETS BY MOUTH TWICE DAILY, Disp: 120 tablet, Rfl: 2 .  dronabinol (MARINOL) 5 MG capsule, Take 1 capsule (5 mg total) by mouth 2 (two) times daily before a meal. (Patient not taking: Reported on 11/23/2019), Disp: 60 capsule, Rfl: 1 .  EPINEPHrine 0.3 mg/0.3 mL IJ SOAJ injection, Inject 0.3 mg into the muscle once., Disp: , Rfl:  .  fenofibrate 160 MG tablet, TAKE 1 TABLET (160 MG TOTAL) BY MOUTH DAILY., Disp: 90 tablet, Rfl: 1 .  HYDROcodone-homatropine (HYCODAN) 5-1.5 MG/5ML syrup, Take 5 mLs by mouth every 4 (four) hours as needed for cough., Disp: 300 mL, Rfl: 0 .  lactulose (CHRONULAC) 10 GM/15ML solution, Take 15 mLs (10 g total) by mouth 2 (two) times daily as needed for mild constipation., Disp: 473 mL, Rfl: 1 .  LORazepam (ATIVAN) 1 MG tablet, Take 1 tablet (1 mg total) by mouth every 6 (six) hours as needed for anxiety., Disp: 60 tablet, Rfl: 0 .  Multiple Vitamin (MULTIVITAMIN) tablet, Take 1 tablet by mouth daily., Disp: , Rfl:  .  pyridoxine (B-6) 200 MG tablet, Take 200 mg  by mouth daily., Disp: , Rfl:  .  solifenacin (VESICARE) 10 MG tablet, Take 10 mg by mouth every evening., Disp: , Rfl:  .  temazepam (RESTORIL) 30 MG capsule, TAKE 1 CAPSULE (30 MG TOTAL) BY MOUTH AT BEDTIME AS NEEDED FOR SLEEP., Disp: 30 capsule, Rfl: 2 .  triamcinolone (NASACORT) 55 MCG/ACT AERO nasal inhaler, , Disp: , Rfl:  .  UNABLE TO FIND, Med Name: Allergy shots., Disp: , Rfl:  No current facility-administered medications for this visit.  Facility-Administered Medications Ordered in Other Visits:  .  heparin lock flush 100 unit/mL, 500 Units, Intracatheter, Once PRN, [place], Sarah M, NP .  heparin lock flush 100 unit/mL, 250 Units, Intracatheter, Once PRN, [place], Sarah M, NP .  sodium chloride flush (NS) 0.9 % injection 10 mL,  10 mL, Intracatheter, Once PRN, [place], Holli Humbles, NP  Allergies:  Allergies  Allergen Reactions  . Bee Venom Anaphylaxis  . Clindamycin/Lincomycin Hives    Past Medical History, Surgical history, Social history, and Family History were reviewed and updated.  Review of Systems: Review of Systems  Constitutional: Negative.   HENT:  Negative.   Eyes: Negative.   Respiratory: Positive for cough.   Cardiovascular: Negative.   Gastrointestinal: Negative.   Endocrine: Negative.   Genitourinary: Negative.    Musculoskeletal: Positive for back pain, myalgias and neck pain.  Skin: Positive for itching and rash.  Neurological: Negative.   Hematological: Negative.   Psychiatric/Behavioral: Negative.     Physical Exam:  weight is 193 lb (87.5 kg). His temporal temperature is 97.1 F (36.2 C) (abnormal). His blood pressure is 103/62 and his pulse is 86. His respiration is 16 and oxygen saturation is 100%.   Wt Readings from Last 3 Encounters:  12/21/19 193 lb (87.5 kg)  12/03/19 188 lb (85.3 kg)  11/23/19 190 lb (86.2 kg)    Physical Exam Vitals reviewed.  HENT:     Head: Normocephalic and atraumatic.  Eyes:     Pupils: Pupils are equal, round, and reactive to light.  Cardiovascular:     Rate and Rhythm: Normal rate and regular rhythm.     Heart sounds: Normal heart sounds.  Pulmonary:     Effort: Pulmonary effort is normal.     Breath sounds: Normal breath sounds.  Abdominal:     General: Bowel sounds are normal.     Palpations: Abdomen is soft.     Comments: Abdominal exam shows a slightly obese abdomen.  He does have an umbilical hernia.  I really cannot palpate his liver at this point.  There is no fluid wave.  There is no inguinal adenopathy.  There is no splenomegaly.    Musculoskeletal:        General: No tenderness or deformity. Normal range of motion.     Cervical back: Normal range of motion.  Lymphadenopathy:     Cervical: No cervical adenopathy.  Skin:     General: Skin is warm and dry.     Findings: No erythema or rash.  Neurological:     Mental Status: He is alert and oriented to person, place, and time.  Psychiatric:        Behavior: Behavior normal.        Thought Content: Thought content normal.        Judgment: Judgment normal.      Lab Results  Component Value Date   WBC 6.6 12/21/2019   HGB 8.7 (L) 12/21/2019   HCT 26.8 (L) 12/21/2019  MCV 100.0 12/21/2019   PLT 216 12/21/2019     Chemistry      Component Value Date/Time   NA 143 12/14/2019 0900   NA 135 (L) 07/12/2015 0813   K 4.0 12/14/2019 0900   K 4.5 07/12/2015 0813   CL 106 12/14/2019 0900   CO2 29 12/14/2019 0900   CO2 24 07/12/2015 0813   BUN 20 12/14/2019 0900   BUN 15.7 07/12/2015 0813   CREATININE 1.54 (H) 12/14/2019 0900   CREATININE 1.6 (H) 07/12/2015 0813      Component Value Date/Time   CALCIUM 9.3 12/14/2019 0900   CALCIUM 10.1 07/12/2015 0813   ALKPHOS 138 (H) 12/14/2019 0900   ALKPHOS 115 07/12/2015 0813   AST 17 12/14/2019 0900   AST 22 07/12/2015 0813   ALT 17 12/14/2019 0900   ALT 24 07/12/2015 0813   BILITOT 0.5 12/14/2019 0900   BILITOT 0.46 07/12/2015 0813       Impression and Plan: Mr. Aull is a 70 year old white male.  He has a past history of renal cell carcinoma and also prostate cancer.  Now, he has a third malignancy.  This is metastatic small cell lung cancer.  It is recurrent.  We will see how he does with the second cycle of Taxotere.  Hopefully, we will see a response.  I probably would try to give him a third cycle of Taxotere and then rescan him.  Hopefully Claritin 10 mg will help with the bony pain that he has with the Neulasta.  We will plan to see him back in 3 weeks.  I think he does need to be transfused.  His hemoglobin is low at 8.7.  This really is on the low side.  Given his other health issues, I really think a transfusion would make a difference.            Volanda Napoleon, MD 5/24/20219:51 AM

## 2019-12-21 NOTE — Telephone Encounter (Signed)
Appointments scheduled as requested per 5/24 los

## 2019-12-21 NOTE — Patient Instructions (Signed)
[facility] Discharge Instructions for Patients Receiving Chemotherapy  Today you received the following chemotherapy agents Taxotere.   To help prevent nausea and vomiting after your treatment, we encourage you to take your nausea medication.   If you develop nausea and vomiting that is not controlled by your nausea medication, call the clinic.   BELOW ARE SYMPTOMS THAT SHOULD BE REPORTED IMMEDIATELY:  *FEVER GREATER THAN 100.5 F  *CHILLS WITH OR WITHOUT FEVER  NAUSEA AND VOMITING THAT IS NOT CONTROLLED WITH YOUR NAUSEA MEDICATION  *UNUSUAL SHORTNESS OF BREATH  *UNUSUAL BRUISING OR BLEEDING  TENDERNESS IN MOUTH AND THROAT WITH OR WITHOUT PRESENCE OF ULCERS  *URINARY PROBLEMS  *BOWEL PROBLEMS  UNUSUAL RASH Items with * indicate a potential emergency and should be followed up as soon as possible.  Feel free to call the clinic should you have any questions or concerns. The clinic phone number is [phone].  Please show the CHEMO ALERT CARD at check-in to the Emergency Department and triage nurse.   

## 2019-12-23 ENCOUNTER — Inpatient Hospital Stay: Payer: 59

## 2019-12-23 ENCOUNTER — Ambulatory Visit: Payer: 59

## 2019-12-23 ENCOUNTER — Other Ambulatory Visit: Payer: Self-pay

## 2019-12-23 ENCOUNTER — Encounter: Payer: Self-pay | Admitting: *Deleted

## 2019-12-23 DIAGNOSIS — Z79899 Other long term (current) drug therapy: Secondary | ICD-10-CM | POA: Diagnosis not present

## 2019-12-23 DIAGNOSIS — Z8546 Personal history of malignant neoplasm of prostate: Secondary | ICD-10-CM | POA: Diagnosis not present

## 2019-12-23 DIAGNOSIS — C3491 Malignant neoplasm of unspecified part of right bronchus or lung: Secondary | ICD-10-CM

## 2019-12-23 DIAGNOSIS — Z85528 Personal history of other malignant neoplasm of kidney: Secondary | ICD-10-CM | POA: Diagnosis not present

## 2019-12-23 DIAGNOSIS — D649 Anemia, unspecified: Secondary | ICD-10-CM

## 2019-12-23 DIAGNOSIS — L03119 Cellulitis of unspecified part of limb: Secondary | ICD-10-CM | POA: Diagnosis not present

## 2019-12-23 DIAGNOSIS — C7931 Secondary malignant neoplasm of brain: Secondary | ICD-10-CM | POA: Diagnosis not present

## 2019-12-23 DIAGNOSIS — Z7982 Long term (current) use of aspirin: Secondary | ICD-10-CM | POA: Diagnosis not present

## 2019-12-23 DIAGNOSIS — Z5111 Encounter for antineoplastic chemotherapy: Secondary | ICD-10-CM | POA: Diagnosis not present

## 2019-12-23 DIAGNOSIS — Z5189 Encounter for other specified aftercare: Secondary | ICD-10-CM | POA: Diagnosis not present

## 2019-12-23 MED ORDER — ACETAMINOPHEN 325 MG PO TABS
650.0000 mg | ORAL_TABLET | Freq: Once | ORAL | Status: AC
  Administered 2019-12-23: 650 mg via ORAL

## 2019-12-23 MED ORDER — ACETAMINOPHEN 325 MG PO TABS
ORAL_TABLET | ORAL | Status: AC
  Filled 2019-12-23: qty 2

## 2019-12-23 MED ORDER — SODIUM CHLORIDE 0.9% FLUSH
10.0000 mL | INTRAVENOUS | Status: AC | PRN
  Administered 2019-12-23: 10 mL
  Filled 2019-12-23: qty 10

## 2019-12-23 MED ORDER — SODIUM CHLORIDE 0.9% IV SOLUTION
250.0000 mL | Freq: Once | INTRAVENOUS | Status: DC
  Filled 2019-12-23: qty 250

## 2019-12-23 MED ORDER — HEPARIN SOD (PORK) LOCK FLUSH 100 UNIT/ML IV SOLN
250.0000 [IU] | INTRAVENOUS | Status: AC | PRN
  Administered 2019-12-23: 500 [IU]
  Filled 2019-12-23: qty 5

## 2019-12-23 MED ORDER — DIPHENHYDRAMINE HCL 25 MG PO CAPS
25.0000 mg | ORAL_CAPSULE | Freq: Once | ORAL | Status: AC
  Administered 2019-12-23: 25 mg via ORAL

## 2019-12-23 MED ORDER — DIPHENHYDRAMINE HCL 25 MG PO CAPS
ORAL_CAPSULE | ORAL | Status: AC
  Filled 2019-12-23: qty 1

## 2019-12-23 NOTE — Patient Instructions (Signed)

## 2019-12-24 ENCOUNTER — Encounter: Payer: Self-pay | Admitting: Orthopedic Surgery

## 2019-12-24 ENCOUNTER — Telehealth: Payer: Self-pay | Admitting: Hematology & Oncology

## 2019-12-24 ENCOUNTER — Ambulatory Visit: Payer: 59 | Admitting: Orthopedic Surgery

## 2019-12-24 VITALS — Ht 68.0 in | Wt 193.0 lb

## 2019-12-24 DIAGNOSIS — Z89432 Acquired absence of left foot: Secondary | ICD-10-CM | POA: Diagnosis not present

## 2019-12-24 DIAGNOSIS — L97511 Non-pressure chronic ulcer of other part of right foot limited to breakdown of skin: Secondary | ICD-10-CM

## 2019-12-24 DIAGNOSIS — I872 Venous insufficiency (chronic) (peripheral): Secondary | ICD-10-CM

## 2019-12-24 LAB — TYPE AND SCREEN
ABO/RH(D): A POS
Antibody Screen: NEGATIVE
Unit division: 0
Unit division: 0

## 2019-12-24 LAB — BPAM RBC
Blood Product Expiration Date: 202106052359
Blood Product Expiration Date: 202106052359
ISSUE DATE / TIME: 202105260732
ISSUE DATE / TIME: 202105260732
Unit Type and Rh: 6200
Unit Type and Rh: 6200

## 2019-12-24 NOTE — Telephone Encounter (Signed)
Appointments added for Labs & Port Flush per 5/26 patient advise message.  I responded to patient through My Chart with updates

## 2019-12-25 ENCOUNTER — Encounter: Payer: Self-pay | Admitting: Orthopedic Surgery

## 2019-12-25 ENCOUNTER — Other Ambulatory Visit: Payer: Self-pay | Admitting: *Deleted

## 2019-12-25 DIAGNOSIS — E871 Hypo-osmolality and hyponatremia: Secondary | ICD-10-CM

## 2019-12-25 DIAGNOSIS — D649 Anemia, unspecified: Secondary | ICD-10-CM

## 2019-12-25 DIAGNOSIS — C3491 Malignant neoplasm of unspecified part of right bronchus or lung: Secondary | ICD-10-CM

## 2019-12-25 DIAGNOSIS — C7800 Secondary malignant neoplasm of unspecified lung: Secondary | ICD-10-CM
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
## 2019-12-29 ENCOUNTER — Inpatient Hospital Stay: Payer: 59 | Attending: Hematology & Oncology

## 2019-12-29 ENCOUNTER — Inpatient Hospital Stay: Payer: 59

## 2019-12-29 ENCOUNTER — Other Ambulatory Visit: Payer: Self-pay

## 2019-12-29 DIAGNOSIS — Z5111 Encounter for antineoplastic chemotherapy: Secondary | ICD-10-CM | POA: Diagnosis not present

## 2019-12-29 DIAGNOSIS — E871 Hypo-osmolality and hyponatremia: Secondary | ICD-10-CM

## 2019-12-29 DIAGNOSIS — C3491 Malignant neoplasm of unspecified part of right bronchus or lung: Secondary | ICD-10-CM

## 2019-12-29 DIAGNOSIS — L03119 Cellulitis of unspecified part of limb: Secondary | ICD-10-CM | POA: Diagnosis not present

## 2019-12-29 DIAGNOSIS — E222 Syndrome of inappropriate secretion of antidiuretic hormone: Secondary | ICD-10-CM | POA: Insufficient documentation

## 2019-12-29 DIAGNOSIS — Z7982 Long term (current) use of aspirin: Secondary | ICD-10-CM | POA: Insufficient documentation

## 2019-12-29 DIAGNOSIS — Z85528 Personal history of other malignant neoplasm of kidney: Secondary | ICD-10-CM | POA: Diagnosis not present

## 2019-12-29 DIAGNOSIS — Z8546 Personal history of malignant neoplasm of prostate: Secondary | ICD-10-CM | POA: Diagnosis not present

## 2019-12-29 DIAGNOSIS — Z79899 Other long term (current) drug therapy: Secondary | ICD-10-CM | POA: Diagnosis not present

## 2019-12-29 DIAGNOSIS — C649 Malignant neoplasm of unspecified kidney, except renal pelvis: Secondary | ICD-10-CM

## 2019-12-29 DIAGNOSIS — Z5189 Encounter for other specified aftercare: Secondary | ICD-10-CM | POA: Diagnosis not present

## 2019-12-29 DIAGNOSIS — D649 Anemia, unspecified: Secondary | ICD-10-CM

## 2019-12-29 LAB — CBC WITH DIFFERENTIAL (CANCER CENTER ONLY)
Abs Immature Granulocytes: 1.32 10*3/uL — ABNORMAL HIGH (ref 0.00–0.07)
Basophils Absolute: 0.1 10*3/uL (ref 0.0–0.1)
Basophils Relative: 1 %
Eosinophils Absolute: 0.1 10*3/uL (ref 0.0–0.5)
Eosinophils Relative: 0 %
HCT: 29.6 % — ABNORMAL LOW (ref 39.0–52.0)
Hemoglobin: 9.8 g/dL — ABNORMAL LOW (ref 13.0–17.0)
Immature Granulocytes: 11 %
Lymphocytes Relative: 3 %
Lymphs Abs: 0.4 10*3/uL — ABNORMAL LOW (ref 0.7–4.0)
MCH: 31.7 pg (ref 26.0–34.0)
MCHC: 33.1 g/dL (ref 30.0–36.0)
MCV: 95.8 fL (ref 80.0–100.0)
Monocytes Absolute: 1.3 10*3/uL — ABNORMAL HIGH (ref 0.1–1.0)
Monocytes Relative: 10 %
Neutro Abs: 9.1 10*3/uL — ABNORMAL HIGH (ref 1.7–7.7)
Neutrophils Relative %: 75 %
Platelet Count: 108 10*3/uL — ABNORMAL LOW (ref 150–400)
RBC: 3.09 MIL/uL — ABNORMAL LOW (ref 4.22–5.81)
RDW: 17.9 % — ABNORMAL HIGH (ref 11.5–15.5)
WBC Count: 12.2 10*3/uL — ABNORMAL HIGH (ref 4.0–10.5)
nRBC: 0.2 % (ref 0.0–0.2)

## 2019-12-29 LAB — SAMPLE TO BLOOD BANK

## 2019-12-29 LAB — CMP (CANCER CENTER ONLY)
ALT: 16 U/L (ref 0–44)
AST: 18 U/L (ref 15–41)
Albumin: 3.3 g/dL — ABNORMAL LOW (ref 3.5–5.0)
Alkaline Phosphatase: 114 U/L (ref 38–126)
Anion gap: 8 (ref 5–15)
BUN: 5 mg/dL — ABNORMAL LOW (ref 8–23)
CO2: 28 mmol/L (ref 22–32)
Calcium: 9.4 mg/dL (ref 8.9–10.3)
Chloride: 101 mmol/L (ref 98–111)
Creatinine: 1.1 mg/dL (ref 0.61–1.24)
GFR, Est AFR Am: >60 mL/min (ref >60)
GFR, Estimated: >60 mL/min (ref >60)
Glucose, Bld: 101 mg/dL — ABNORMAL HIGH (ref 70–99)
Potassium: 3.4 mmol/L — ABNORMAL LOW (ref 3.5–5.1)
Sodium: 137 mmol/L (ref 135–145)
Total Bilirubin: 0.4 mg/dL (ref 0.3–1.2)
Total Protein: 5.7 g/dL — ABNORMAL LOW (ref 6.5–8.1)

## 2019-12-29 MED ORDER — SODIUM CHLORIDE 0.9 % IV SOLN
Freq: Once | INTRAVENOUS | Status: AC
  Filled 2019-12-29: qty 250

## 2019-12-29 MED ORDER — SODIUM CHLORIDE 0.9% FLUSH
10.0000 mL | Freq: Once | INTRAVENOUS | Status: AC | PRN
  Administered 2019-12-29: 10 mL
  Filled 2019-12-29: qty 10

## 2019-12-29 MED ORDER — HEPARIN SOD (PORK) LOCK FLUSH 100 UNIT/ML IV SOLN
500.0000 [IU] | Freq: Once | INTRAVENOUS | Status: AC | PRN
  Administered 2019-12-29: 500 [IU]
  Filled 2019-12-29: qty 5

## 2019-12-29 MED FILL — TEMAZEPAM 30 MG CAPSULE: 30 | 30 days supply | Qty: 30 | Fill #1

## 2019-12-29 MED FILL — TRIAMCINOLONE ACETONIDE 55: 55 | 30 days supply | Qty: 17 | Fill #2

## 2019-12-30 ENCOUNTER — Other Ambulatory Visit: Payer: Self-pay | Admitting: Hematology & Oncology

## 2020-01-01 ENCOUNTER — Other Ambulatory Visit: Payer: Self-pay | Admitting: *Deleted

## 2020-01-01 DIAGNOSIS — L03119 Cellulitis of unspecified part of limb: Secondary | ICD-10-CM

## 2020-01-04 ENCOUNTER — Inpatient Hospital Stay: Payer: 59

## 2020-01-04 ENCOUNTER — Other Ambulatory Visit: Payer: Self-pay

## 2020-01-04 VITALS — BP 141/69 | HR 75 | Resp 17

## 2020-01-04 DIAGNOSIS — L03119 Cellulitis of unspecified part of limb: Secondary | ICD-10-CM | POA: Diagnosis not present

## 2020-01-04 DIAGNOSIS — Z79899 Other long term (current) drug therapy: Secondary | ICD-10-CM | POA: Diagnosis not present

## 2020-01-04 DIAGNOSIS — E222 Syndrome of inappropriate secretion of antidiuretic hormone: Secondary | ICD-10-CM | POA: Diagnosis not present

## 2020-01-04 DIAGNOSIS — Z7982 Long term (current) use of aspirin: Secondary | ICD-10-CM | POA: Diagnosis not present

## 2020-01-04 DIAGNOSIS — C3491 Malignant neoplasm of unspecified part of right bronchus or lung: Secondary | ICD-10-CM | POA: Diagnosis not present

## 2020-01-04 DIAGNOSIS — Z8546 Personal history of malignant neoplasm of prostate: Secondary | ICD-10-CM | POA: Diagnosis not present

## 2020-01-04 DIAGNOSIS — Z85528 Personal history of other malignant neoplasm of kidney: Secondary | ICD-10-CM | POA: Diagnosis not present

## 2020-01-04 DIAGNOSIS — Z5111 Encounter for antineoplastic chemotherapy: Secondary | ICD-10-CM | POA: Diagnosis not present

## 2020-01-04 DIAGNOSIS — Z5189 Encounter for other specified aftercare: Secondary | ICD-10-CM | POA: Diagnosis not present

## 2020-01-04 LAB — CBC WITH DIFFERENTIAL (CANCER CENTER ONLY)
Abs Immature Granulocytes: 0.1 10*3/uL — ABNORMAL HIGH (ref 0.00–0.07)
Basophils Absolute: 0 10*3/uL (ref 0.0–0.1)
Basophils Relative: 0 %
Eosinophils Absolute: 0 10*3/uL (ref 0.0–0.5)
Eosinophils Relative: 1 %
HCT: 31.5 % — ABNORMAL LOW (ref 39.0–52.0)
Hemoglobin: 10.3 g/dL — ABNORMAL LOW (ref 13.0–17.0)
Immature Granulocytes: 1 %
Lymphocytes Relative: 5 %
Lymphs Abs: 0.3 10*3/uL — ABNORMAL LOW (ref 0.7–4.0)
MCH: 31.7 pg (ref 26.0–34.0)
MCHC: 32.7 g/dL (ref 30.0–36.0)
MCV: 96.9 fL (ref 80.0–100.0)
Monocytes Absolute: 0.4 10*3/uL (ref 0.1–1.0)
Monocytes Relative: 5 %
Neutro Abs: 6.3 10*3/uL (ref 1.7–7.7)
Neutrophils Relative %: 88 %
Platelet Count: 143 10*3/uL — ABNORMAL LOW (ref 150–400)
RBC: 3.25 MIL/uL — ABNORMAL LOW (ref 4.22–5.81)
RDW: 17.1 % — ABNORMAL HIGH (ref 11.5–15.5)
WBC Count: 7.1 10*3/uL (ref 4.0–10.5)
nRBC: 0 % (ref 0.0–0.2)

## 2020-01-04 LAB — CMP (CANCER CENTER ONLY)
ALT: 13 U/L (ref 0–44)
AST: 15 U/L (ref 15–41)
Albumin: 3.4 g/dL — ABNORMAL LOW (ref 3.5–5.0)
Alkaline Phosphatase: 99 U/L (ref 38–126)
Anion gap: 6 (ref 5–15)
BUN: 9 mg/dL (ref 8–23)
CO2: 30 mmol/L (ref 22–32)
Calcium: 9.4 mg/dL (ref 8.9–10.3)
Chloride: 100 mmol/L (ref 98–111)
Creatinine: 1.11 mg/dL (ref 0.61–1.24)
GFR, Est AFR Am: >60 mL/min (ref >60)
GFR, Estimated: >60 mL/min (ref >60)
Glucose, Bld: 108 mg/dL — ABNORMAL HIGH (ref 70–99)
Potassium: 3.5 mmol/L (ref 3.5–5.1)
Sodium: 136 mmol/L (ref 135–145)
Total Bilirubin: 0.5 mg/dL (ref 0.3–1.2)
Total Protein: 5.9 g/dL — ABNORMAL LOW (ref 6.5–8.1)

## 2020-01-04 MED ORDER — ALTEPLASE 2 MG IJ SOLR
2.0000 mg | Freq: Once | INTRAMUSCULAR | Status: DC | PRN
  Filled 2020-01-04: qty 2

## 2020-01-04 MED ORDER — HEPARIN SOD (PORK) LOCK FLUSH 100 UNIT/ML IV SOLN
500.0000 [IU] | Freq: Once | INTRAVENOUS | Status: AC | PRN
  Administered 2020-01-04: 500 [IU]
  Filled 2020-01-04: qty 5

## 2020-01-04 MED ORDER — SODIUM CHLORIDE 0.9% FLUSH
10.0000 mL | INTRAVENOUS | Status: DC | PRN
  Administered 2020-01-04: 10 mL
  Filled 2020-01-04: qty 10

## 2020-01-04 MED ORDER — SODIUM CHLORIDE 0.9 % IV SOLN
Freq: Once | INTRAVENOUS | Status: AC
  Filled 2020-01-04: qty 250

## 2020-01-04 NOTE — Patient Instructions (Signed)

## 2020-01-05 ENCOUNTER — Ambulatory Visit: Payer: 59 | Admitting: Family

## 2020-01-05 ENCOUNTER — Encounter: Payer: Self-pay | Admitting: Orthopedic Surgery

## 2020-01-05 DIAGNOSIS — L97211 Non-pressure chronic ulcer of right calf limited to breakdown of skin: Secondary | ICD-10-CM
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
## 2020-01-05 NOTE — Progress Notes (Signed)
Pharmacist Chemotherapy Monitoring - Follow Up Assessment    I verify that I have reviewed each item in the below checklist:  . Regimen for the patient is scheduled for the appropriate day and plan matches scheduled date. Marland Kitchen Appropriate non-routine labs are ordered dependent on drug ordered. . If applicable, additional medications reviewed and ordered per protocol based on lifetime cumulative doses and/or treatment regimen.   Plan for follow-up and/or issues identified: No . I-vent associated with next due treatment: No . MD and/or nursing notified: No  Janis Cuffe, [name] 01/05/2020 8:24 AM

## 2020-01-12 ENCOUNTER — Inpatient Hospital Stay (HOSPITAL_BASED_OUTPATIENT_CLINIC_OR_DEPARTMENT_OTHER): Payer: 59 | Admitting: Family

## 2020-01-12 ENCOUNTER — Other Ambulatory Visit: Payer: Self-pay

## 2020-01-12 ENCOUNTER — Inpatient Hospital Stay: Payer: 59

## 2020-01-12 ENCOUNTER — Encounter: Payer: Self-pay | Admitting: Family

## 2020-01-12 ENCOUNTER — Telehealth: Payer: Self-pay | Admitting: Hematology & Oncology

## 2020-01-12 VITALS — BP 160/84 | HR 89 | Temp 96.8°F | Resp 18 | Ht 68.0 in | Wt 191.4 lb

## 2020-01-12 DIAGNOSIS — C3491 Malignant neoplasm of unspecified part of right bronchus or lung: Secondary | ICD-10-CM | POA: Diagnosis not present

## 2020-01-12 DIAGNOSIS — R058 Other specified cough: Secondary | ICD-10-CM

## 2020-01-12 DIAGNOSIS — Z7982 Long term (current) use of aspirin: Secondary | ICD-10-CM | POA: Diagnosis not present

## 2020-01-12 DIAGNOSIS — L03119 Cellulitis of unspecified part of limb: Secondary | ICD-10-CM | POA: Diagnosis not present

## 2020-01-12 DIAGNOSIS — Z79899 Other long term (current) drug therapy: Secondary | ICD-10-CM | POA: Diagnosis not present

## 2020-01-12 DIAGNOSIS — Z5111 Encounter for antineoplastic chemotherapy: Secondary | ICD-10-CM | POA: Diagnosis not present

## 2020-01-12 DIAGNOSIS — R05 Cough: Secondary | ICD-10-CM | POA: Diagnosis not present

## 2020-01-12 DIAGNOSIS — Z85528 Personal history of other malignant neoplasm of kidney: Secondary | ICD-10-CM | POA: Diagnosis not present

## 2020-01-12 DIAGNOSIS — E222 Syndrome of inappropriate secretion of antidiuretic hormone: Secondary | ICD-10-CM | POA: Diagnosis not present

## 2020-01-12 DIAGNOSIS — Z5189 Encounter for other specified aftercare: Secondary | ICD-10-CM | POA: Diagnosis not present

## 2020-01-12 DIAGNOSIS — Z8546 Personal history of malignant neoplasm of prostate: Secondary | ICD-10-CM | POA: Diagnosis not present

## 2020-01-12 LAB — CBC WITH DIFFERENTIAL (CANCER CENTER ONLY)
Abs Immature Granulocytes: 0.03 10*3/uL (ref 0.00–0.07)
Basophils Absolute: 0.1 10*3/uL (ref 0.0–0.1)
Basophils Relative: 1 %
Eosinophils Absolute: 0 10*3/uL (ref 0.0–0.5)
Eosinophils Relative: 0 %
HCT: 32.6 % — ABNORMAL LOW (ref 39.0–52.0)
Hemoglobin: 10.7 g/dL — ABNORMAL LOW (ref 13.0–17.0)
Immature Granulocytes: 1 %
Lymphocytes Relative: 6 %
Lymphs Abs: 0.4 10*3/uL — ABNORMAL LOW (ref 0.7–4.0)
MCH: 32.5 pg (ref 26.0–34.0)
MCHC: 32.8 g/dL (ref 30.0–36.0)
MCV: 99.1 fL (ref 80.0–100.0)
Monocytes Absolute: 0.6 10*3/uL (ref 0.1–1.0)
Monocytes Relative: 10 %
Neutro Abs: 4.7 10*3/uL (ref 1.7–7.7)
Neutrophils Relative %: 82 %
Platelet Count: 147 10*3/uL — ABNORMAL LOW (ref 150–400)
RBC: 3.29 MIL/uL — ABNORMAL LOW (ref 4.22–5.81)
RDW: 17.5 % — ABNORMAL HIGH (ref 11.5–15.5)
WBC Count: 5.7 10*3/uL (ref 4.0–10.5)
nRBC: 0 % (ref 0.0–0.2)

## 2020-01-12 LAB — CMP (CANCER CENTER ONLY)
ALT: 20 U/L (ref 0–44)
AST: 31 U/L (ref 15–41)
Albumin: 3.7 g/dL (ref 3.5–5.0)
Alkaline Phosphatase: 119 U/L (ref 38–126)
Anion gap: 8 (ref 5–15)
BUN: 10 mg/dL (ref 8–23)
CO2: 30 mmol/L (ref 22–32)
Calcium: 9.7 mg/dL (ref 8.9–10.3)
Chloride: 104 mmol/L (ref 98–111)
Creatinine: 1.15 mg/dL (ref 0.61–1.24)
GFR, Est AFR Am: >60 mL/min (ref >60)
GFR, Estimated: >60 mL/min (ref >60)
Glucose, Bld: 103 mg/dL — ABNORMAL HIGH (ref 70–99)
Potassium: 3.6 mmol/L (ref 3.5–5.1)
Sodium: 142 mmol/L (ref 135–145)
Total Bilirubin: 0.6 mg/dL (ref 0.3–1.2)
Total Protein: 5.9 g/dL — ABNORMAL LOW (ref 6.5–8.1)

## 2020-01-12 MED ORDER — IPRATROPIUM-ALBUTEROL 0.5-2.5 (3) MG/3ML IN SOLN: 3.0000 mL | Freq: Four times a day (QID) | RESPIRATORY_TRACT | 6 refills | Status: AC | PRN

## 2020-01-12 MED ORDER — PEGFILGRASTIM 6 MG/0.6ML ~~LOC~~ PSKT
PREFILLED_SYRINGE | SUBCUTANEOUS | Status: AC
  Filled 2020-01-12: qty 0.6

## 2020-01-12 MED ORDER — SODIUM CHLORIDE 0.9% FLUSH
10.0000 mL | INTRAVENOUS | Status: DC | PRN
  Administered 2020-01-12: 10 mL
  Filled 2020-01-12: qty 10

## 2020-01-12 MED ORDER — SODIUM CHLORIDE 0.9 % IV SOLN
67.5000 mg/m2 | Freq: Once | INTRAVENOUS | Status: AC
  Administered 2020-01-12: 140 mg via INTRAVENOUS
  Filled 2020-01-12: qty 14

## 2020-01-12 MED ORDER — SODIUM CHLORIDE 0.9 % IV SOLN
10.0000 mg | Freq: Once | INTRAVENOUS | Status: AC
  Administered 2020-01-12: 10 mg via INTRAVENOUS
  Filled 2020-01-12: qty 1

## 2020-01-12 MED ORDER — SODIUM CHLORIDE 0.9 % IV SOLN
Freq: Once | INTRAVENOUS | Status: AC
  Filled 2020-01-12: qty 250

## 2020-01-12 MED ORDER — PEGFILGRASTIM 6 MG/0.6ML ~~LOC~~ PSKT
6.0000 mg | PREFILLED_SYRINGE | Freq: Once | SUBCUTANEOUS | Status: AC
  Administered 2020-01-12: 6 mg via SUBCUTANEOUS

## 2020-01-12 MED ORDER — HEPARIN SOD (PORK) LOCK FLUSH 100 UNIT/ML IV SOLN
500.0000 [IU] | Freq: Once | INTRAVENOUS | Status: AC | PRN
  Administered 2020-01-12: 500 [IU]
  Filled 2020-01-12: qty 5

## 2020-01-12 MED FILL — IPRAT-ALBUT 0.5-3(2.5) MG/3: 0.5-2.5 (3) | 30 days supply | Qty: 360 | Fill #0

## 2020-01-12 NOTE — Patient Instructions (Signed)

## 2020-01-12 NOTE — Telephone Encounter (Signed)
Appointments scheduled calendar printed per 6/15 los

## 2020-01-12 NOTE — Progress Notes (Signed)
Hematology and Oncology Follow Up Visit  DRACEN REIGLE [id number] 04/02/1951 69 y.o. 01/12/2020   Principle Diagnosis:  Extensive stage small cell lung cancer SIADH secondary to small cell lung cancer  Past Therapy: Carboplatinum/etoposide/Tecentriq-cycle #6 Atezolizumab 1200 mg IV q 3 week -- maintenance -  Start 01/21/2019 XRT for CNS mets -- completed on 03/27/2019 Lurbinectedin -- s/p cycle #4-- started on 05/07/2019 -- d/c on 09/03/2019 due to progression CDDP/Irinotecan -- s/p cycle #3- started on 09/11/2019 -- d/c on 12/16/2019   Current Therapy:  Taxotere -- started 11/30/2019, s/p cycle 2   Interim History:  Mr. Arif is here today for follow-up and treatment. He is still experiencing fatigue and balance issues. Thankfully he has not had any falls or syncopal episodes. He is ambulating with a rolling walker for added support.  He has been taking his Declomycin 1 tablet once a day. Sodium level is stable at 142.  He has had a productive (clear phlegm) cough and mild SOB at times. Lung sounds are course throughout. He does not want a chest xray at this time but would like a different inhaler than the Combivent.  No fever, chills, n/v, rash, chest pain, palpitations, abdominal pain or changes in bowel or bladder habits.  The puffiness in his lower extremities comes and goes. It seems to improve once he removes his leg brace and socks.  The numbness in his left leg is unchanged. This has been present since childhood with history of multiple surgeries. No episodes of bleeding. He does bruise easily on aspirin.  His appetite comes and goes.   ECOG Performance Status: 1 - Symptomatic but completely ambulatory  Medications:  Allergies as of 01/12/2020      Reactions   Bee Venom Anaphylaxis   Clindamycin/lincomycin Hives      Medication List       Accurate as of January 12, 2020 11:16 AM. If you have any questions, ask your nurse or doctor.        amLODipine 10 MG  tablet Commonly known as: NORVASC Take 10 mg by mouth daily.   aspirin EC 81 MG tablet Take 81 mg by mouth daily.   calcium carbonate 500 MG chewable tablet Commonly known as: TUMS - dosed in mg elemental calcium Chew 2 tablets by mouth daily as needed for indigestion or heartburn.   ciprofloxacin-dexamethasone OTIC suspension Commonly known as: CIPRODEX Place 5 drops into both ears as needed.   Combivent Respimat 20-100 MCG/ACT Aers respimat Generic drug: Ipratropium-Albuterol Inhale 1 puff into the lungs 2 (two) times daily.   demeclocycline 150 MG tablet Commonly known as: DECLOMYCIN TAKE 2 TABLETS BY MOUTH TWICE DAILY   dronabinol 5 MG capsule Commonly known as: MARINOL Take 1 capsule (5 mg total) by mouth 2 (two) times daily before a meal.   EPINEPHrine 0.3 mg/0.3 mL Soaj injection Commonly known as: EPI-PEN Inject 0.3 mg into the muscle once.   fenofibrate 160 MG tablet TAKE 1 TABLET (160 MG TOTAL) BY MOUTH DAILY.   HYDROcodone-homatropine 5-1.5 MG/5ML syrup Commonly known as: HYCODAN Take 5 mLs by mouth every 4 (four) hours as needed for cough.   lactulose 10 GM/15ML solution Commonly known as: CHRONULAC Take 15 mLs (10 g total) by mouth 2 (two) times daily as needed for mild constipation.   LORazepam 1 MG tablet Commonly known as: ATIVAN Take 1 tablet (1 mg total) by mouth every 6 (six) hours as needed for anxiety.   multivitamin tablet Take 1 tablet by mouth  daily.   pyridoxine 200 MG tablet Commonly known as: B-6 Take 200 mg by mouth daily.   solifenacin 10 MG tablet Commonly known as: VESICARE Take 10 mg by mouth every evening.   temazepam 30 MG capsule Commonly known as: RESTORIL TAKE 1 CAPSULE (30 MG TOTAL) BY MOUTH AT BEDTIME AS NEEDED FOR SLEEP.   triamcinolone 55 MCG/ACT Aero nasal inhaler Commonly known as: NASACORT   UNABLE TO FIND Med Name: Allergy shots.       Allergies:  Allergies  Allergen Reactions  . Bee Venom  Anaphylaxis  . Clindamycin/Lincomycin Hives    Past Medical History, Surgical history, Social history, and Family History were reviewed and updated.  Review of Systems: All other 10 point review of systems is negative.   Physical Exam:  vitals were not taken for this visit.   Wt Readings from Last 3 Encounters:  12/24/19 193 lb (87.5 kg)  12/21/19 193 lb (87.5 kg)  12/03/19 188 lb (85.3 kg)    Ocular: Sclerae unicteric, pupils equal, round and reactive to light Ear-nose-throat: Oropharynx clear, dentition fair Lymphatic: No cervical or supraclavicular adenopathy Lungs course throughout, good excursion bilaterally Heart regular rate and rhythm, no murmur appreciated Abd soft, nontender, positive bowel sounds, no liver or spleen tip palpated on exam,no fluid wave  MSK no focal spinal tenderness, no joint edema Neuro: non-focal, well-oriented, appropriate affect Breasts: Deferred   Lab Results  Component Value Date   WBC 7.1 01/04/2020   HGB 10.3 (L) 01/04/2020   HCT 31.5 (L) 01/04/2020   MCV 96.9 01/04/2020   PLT 143 (L) 01/04/2020   Lab Results  Component Value Date   FERRITIN 100 10/26/2019   IRON 63 10/26/2019   TIBC 295 10/26/2019   UIBC 232 10/26/2019   IRONPCTSAT 21 10/26/2019   Lab Results  Component Value Date   RETICCTPCT 1.2 05/31/2015   RBC 3.25 (L) 01/04/2020   RETICCTABS 52.9 05/31/2015   No results found for: Nils Pyle, KAPLAMBRATIO Lab Results  Component Value Date   IGGSERUM 657 (L) 07/04/2018   No results found for: Odetta Pink, SPEI   Chemistry      Component Value Date/Time   NA 136 01/04/2020 0857   NA 135 (L) 07/12/2015 0813   K 3.5 01/04/2020 0857   K 4.5 07/12/2015 0813   CL 100 01/04/2020 0857   CO2 30 01/04/2020 0857   CO2 24 07/12/2015 0813   BUN 9 01/04/2020 0857   BUN 15.7 07/12/2015 0813   CREATININE 1.11 01/04/2020 0857   CREATININE 1.6 (H) 07/12/2015  0813      Component Value Date/Time   CALCIUM 9.4 01/04/2020 0857   CALCIUM 10.1 07/12/2015 0813   ALKPHOS 99 01/04/2020 0857   ALKPHOS 115 07/12/2015 0813   AST 15 01/04/2020 0857   AST 22 07/12/2015 0813   ALT 13 01/04/2020 0857   ALT 24 07/12/2015 0813   BILITOT 0.5 01/04/2020 0857   BILITOT 0.46 07/12/2015 0813       Impression and Plan: Mr. Clearman is a 69 yo caucasian gentleman with past history of renal cell carcinoma and prostate cancer. He has a third malignancy, metastatic small cell lung cancer, recurrent.  We will proceed with treatment today as planned with Taxotere, cycle 3.  We will get him a new Calendar today with weekly lab and fluids and treatment and follow-up in 3 weeks.  He prefers Mondays when possibly due to transportation.  We will  get him set up at home with a nebulizer and Duonebs Q6 hours as needed.  He will contact our office with any questions or concerns. We can certainly see him sooner if needed.   Laverna Peace, NP 6/15/202111:16 AM

## 2020-01-12 NOTE — Patient Instructions (Addendum)
Docetaxel injection What is this medicine? DOCETAXEL (doe se TAX el) is a chemotherapy drug. It targets fast dividing cells, like cancer cells, and causes these cells to die. This medicine is used to treat many types of cancers like breast cancer, certain stomach cancers, head and neck cancer, lung cancer, and prostate cancer. This medicine may be used for other purposes; ask your health care provider or pharmacist if you have questions. COMMON BRAND NAME(S): Docefrez, Taxotere What should I tell my health care provider before I take this medicine? They need to know if you have any of these conditions:  infection (especially a virus infection such as chickenpox, cold sores, or herpes)  liver disease  low blood counts, like low white cell, platelet, or red cell counts  an unusual or allergic reaction to docetaxel, polysorbate 80, other chemotherapy agents, other medicines, foods, dyes, or preservatives  pregnant or trying to get pregnant  breast-feeding How should I use this medicine? This drug is given as an infusion into a vein. It is administered in a hospital or clinic by a specially trained health care professional. Talk to your pediatrician regarding the use of this medicine in children. Special care may be needed. Overdosage: If you think you have taken too much of this medicine contact a poison control center or emergency room at once. NOTE: This medicine is only for you. Do not share this medicine with others. What if I miss a dose? It is important not to miss your dose. Call your doctor or health care professional if you are unable to keep an appointment. What may interact with this medicine?  aprepitant  certain antibiotics like erythromycin or clarithromycin  certain antivirals for HIV or hepatitis  certain medicines for fungal infections like fluconazole, itraconazole, ketoconazole, posaconazole, or  voriconazole  cimetidine  ciprofloxacin  conivaptan  cyclosporine  dronedarone  fluvoxamine  grapefruit juice  imatinib  verapamil This list may not describe all possible interactions. Give your health care provider a list of all the medicines, herbs, non-prescription drugs, or dietary supplements you use. Also tell them if you smoke, drink alcohol, or use illegal drugs. Some items may interact with your medicine. What should I watch for while using this medicine? Your condition will be monitored carefully while you are receiving this medicine. You will need important blood work done while you are taking this medicine. Call your doctor or health care professional for advice if you get a fever, chills or sore throat, or other symptoms of a cold or flu. Do not treat yourself. This drug decreases your body's ability to fight infections. Try to avoid being around people who are sick. Some products may contain alcohol. Ask your health care professional if this medicine contains alcohol. Be sure to tell all health care professionals you are taking this medicine. Certain medicines, like metronidazole and disulfiram, can cause an unpleasant reaction when taken with alcohol. The reaction includes flushing, headache, nausea, vomiting, sweating, and increased thirst. The reaction can last from 30 minutes to several hours. You may get drowsy or dizzy. Do not drive, use machinery, or do anything that needs mental alertness until you know how this medicine affects you. Do not stand or sit up quickly, especially if you are an older patient. This reduces the risk of dizzy or fainting spells. Alcohol may interfere with the effect of this medicine. Talk to your health care professional about your risk of cancer. You may be more at risk for certain types of cancer if  you take this medicine. Do not become pregnant while taking this medicine or for 6 months after stopping it. Women should inform their doctor if  they wish to become pregnant or think they might be pregnant. There is a potential for serious side effects to an unborn child. Talk to your health care professional or pharmacist for more information. Do not breast-feed an infant while taking this medicine or for 1 week after stopping it. Males who get this medicine must use a condom during sex with females who can get pregnant. If you get a woman pregnant, the baby could have birth defects. The baby could die before they are born. You will need to continue wearing a condom for 3 months after stopping the medicine. Tell your health care provider right away if your partner becomes pregnant while you are taking this medicine. This may interfere with the ability to father a child. You should talk to your doctor or health care professional if you are concerned about your fertility. What side effects may I notice from receiving this medicine? Side effects that you should report to your doctor or health care professional as soon as possible:  allergic reactions like skin rash, itching or hives, swelling of the face, lips, or tongue  blurred vision  breathing problems  changes in vision  low blood counts - This drug may decrease the number of white blood cells, red blood cells and platelets. You may be at increased risk for infections and bleeding.  nausea and vomiting  pain, redness or irritation at site where injected  pain, tingling, numbness in the hands or feet  redness, blistering, peeling, or loosening of the skin, including inside the mouth  signs of decreased platelets or bleeding - bruising, pinpoint red spots on the skin, black, tarry stools, nosebleeds  signs of decreased red blood cells - unusually weak or tired, fainting spells, lightheadedness  signs of infection - fever or chills, cough, sore throat, pain or difficulty passing urine  swelling of the ankle, feet, hands Side effects that usually do not require medical attention  (report to your doctor or health care professional if they continue or are bothersome):  constipation  diarrhea  fingernail or toenail changes  hair loss  loss of appetite  mouth sores  muscle pain This list may not describe all possible side effects. Call your doctor for medical advice about side effects. You may report side effects to FDA at 1-800-FDA-1088. Where should I keep my medicine? This drug is given in a hospital or clinic and will not be stored at home. NOTE: This sheet is a summary. It may not cover all possible information. If you have questions about this medicine, talk to your doctor, pharmacist, or health care provider.  2020 Elsevier/Gold Standard (2019-03-12 10:19:06) Docetaxel injection What is this medicine? DOCETAXEL (doe se TAX el) is a chemotherapy drug. It targets fast dividing cells, like cancer cells, and causes these cells to die. This medicine is used to treat many types of cancers like breast cancer, certain stomach cancers, head and neck cancer, lung cancer, and prostate cancer. This medicine may be used for other purposes; ask your health care provider or pharmacist if you have questions. COMMON BRAND NAME(S): Docefrez, Taxotere What should I tell my health care provider before I take this medicine? They need to know if you have any of these conditions:  infection (especially a virus infection such as chickenpox, cold sores, or herpes)  liver disease  low blood counts,  like low white cell, platelet, or red cell counts  an unusual or allergic reaction to docetaxel, polysorbate 80, other chemotherapy agents, other medicines, foods, dyes, or preservatives  pregnant or trying to get pregnant  breast-feeding How should I use this medicine? This drug is given as an infusion into a vein. It is administered in a hospital or clinic by a specially trained health care professional. Talk to your pediatrician regarding the use of this medicine in children.  Special care may be needed. Overdosage: If you think you have taken too much of this medicine contact a poison control center or emergency room at once. NOTE: This medicine is only for you. Do not share this medicine with others. What if I miss a dose? It is important not to miss your dose. Call your doctor or health care professional if you are unable to keep an appointment. What may interact with this medicine?  aprepitant  certain antibiotics like erythromycin or clarithromycin  certain antivirals for HIV or hepatitis  certain medicines for fungal infections like fluconazole, itraconazole, ketoconazole, posaconazole, or voriconazole  cimetidine  ciprofloxacin  conivaptan  cyclosporine  dronedarone  fluvoxamine  grapefruit juice  imatinib  verapamil This list may not describe all possible interactions. Give your health care provider a list of all the medicines, herbs, non-prescription drugs, or dietary supplements you use. Also tell them if you smoke, drink alcohol, or use illegal drugs. Some items may interact with your medicine. What should I watch for while using this medicine? Your condition will be monitored carefully while you are receiving this medicine. You will need important blood work done while you are taking this medicine. Call your doctor or health care professional for advice if you get a fever, chills or sore throat, or other symptoms of a cold or flu. Do not treat yourself. This drug decreases your body's ability to fight infections. Try to avoid being around people who are sick. Some products may contain alcohol. Ask your health care professional if this medicine contains alcohol. Be sure to tell all health care professionals you are taking this medicine. Certain medicines, like metronidazole and disulfiram, can cause an unpleasant reaction when taken with alcohol. The reaction includes flushing, headache, nausea, vomiting, sweating, and increased thirst. The  reaction can last from 30 minutes to several hours. You may get drowsy or dizzy. Do not drive, use machinery, or do anything that needs mental alertness until you know how this medicine affects you. Do not stand or sit up quickly, especially if you are an older patient. This reduces the risk of dizzy or fainting spells. Alcohol may interfere with the effect of this medicine. Talk to your health care professional about your risk of cancer. You may be more at risk for certain types of cancer if you take this medicine. Do not become pregnant while taking this medicine or for 6 months after stopping it. Women should inform their doctor if they wish to become pregnant or think they might be pregnant. There is a potential for serious side effects to an unborn child. Talk to your health care professional or pharmacist for more information. Do not breast-feed an infant while taking this medicine or for 1 week after stopping it. Males who get this medicine must use a condom during sex with females who can get pregnant. If you get a woman pregnant, the baby could have birth defects. The baby could die before they are born. You will need to continue wearing a condom for  3 months after stopping the medicine. Tell your health care provider right away if your partner becomes pregnant while you are taking this medicine. This may interfere with the ability to father a child. You should talk to your doctor or health care professional if you are concerned about your fertility. What side effects may I notice from receiving this medicine? Side effects that you should report to your doctor or health care professional as soon as possible:  allergic reactions like skin rash, itching or hives, swelling of the face, lips, or tongue  blurred vision  breathing problems  changes in vision  low blood counts - This drug may decrease the number of white blood cells, red blood cells and platelets. You may be at increased risk for  infections and bleeding.  nausea and vomiting  pain, redness or irritation at site where injected  pain, tingling, numbness in the hands or feet  redness, blistering, peeling, or loosening of the skin, including inside the mouth  signs of decreased platelets or bleeding - bruising, pinpoint red spots on the skin, black, tarry stools, nosebleeds  signs of decreased red blood cells - unusually weak or tired, fainting spells, lightheadedness  signs of infection - fever or chills, cough, sore throat, pain or difficulty passing urine  swelling of the ankle, feet, hands Side effects that usually do not require medical attention (report to your doctor or health care professional if they continue or are bothersome):  constipation  diarrhea  fingernail or toenail changes  hair loss  loss of appetite  mouth sores  muscle pain This list may not describe all possible side effects. Call your doctor for medical advice about side effects. You may report side effects to FDA at 1-800-FDA-1088. Where should I keep my medicine? This drug is given in a hospital or clinic and will not be stored at home. NOTE: This sheet is a summary. It may not cover all possible information. If you have questions about this medicine, talk to your doctor, pharmacist, or health care provider.  2020 Elsevier/Gold Standard (2019-03-12 10:19:06)

## 2020-01-13 LAB — LACTATE DEHYDROGENASE: LDH: 256 U/L — ABNORMAL HIGH (ref 98–192)

## 2020-01-14 ENCOUNTER — Telehealth: Payer: Self-pay | Admitting: *Deleted

## 2020-01-14 ENCOUNTER — Other Ambulatory Visit: Payer: Self-pay | Admitting: Family

## 2020-01-14 DIAGNOSIS — C3491 Malignant neoplasm of unspecified part of right bronchus or lung: Secondary | ICD-10-CM

## 2020-01-14 DIAGNOSIS — R058 Other specified cough: Secondary | ICD-10-CM

## 2020-01-14 DIAGNOSIS — L03119 Cellulitis of unspecified part of limb: Secondary | ICD-10-CM

## 2020-01-14 NOTE — Telephone Encounter (Signed)
Message received from patient requesting assistance in obtaining a nebulizer.  Call placed back to patient and patient notified to obtain a nebulizer from a local pharmacy.  Pt not happy with that information and states that he will call his medical supply store now.

## 2020-01-18 ENCOUNTER — Inpatient Hospital Stay: Payer: 59

## 2020-01-18 ENCOUNTER — Other Ambulatory Visit: Payer: Self-pay

## 2020-01-18 ENCOUNTER — Telehealth: Payer: Self-pay | Admitting: *Deleted

## 2020-01-18 VITALS — BP 161/79 | HR 96 | Temp 98.9°F | Resp 17

## 2020-01-18 DIAGNOSIS — Z5189 Encounter for other specified aftercare: Secondary | ICD-10-CM | POA: Diagnosis not present

## 2020-01-18 DIAGNOSIS — C3491 Malignant neoplasm of unspecified part of right bronchus or lung: Secondary | ICD-10-CM

## 2020-01-18 DIAGNOSIS — Z8546 Personal history of malignant neoplasm of prostate: Secondary | ICD-10-CM | POA: Diagnosis not present

## 2020-01-18 DIAGNOSIS — Z79899 Other long term (current) drug therapy: Secondary | ICD-10-CM | POA: Diagnosis not present

## 2020-01-18 DIAGNOSIS — L03119 Cellulitis of unspecified part of limb: Secondary | ICD-10-CM | POA: Diagnosis not present

## 2020-01-18 DIAGNOSIS — Z5111 Encounter for antineoplastic chemotherapy: Secondary | ICD-10-CM | POA: Diagnosis not present

## 2020-01-18 DIAGNOSIS — E222 Syndrome of inappropriate secretion of antidiuretic hormone: Secondary | ICD-10-CM | POA: Diagnosis not present

## 2020-01-18 DIAGNOSIS — Z85528 Personal history of other malignant neoplasm of kidney: Secondary | ICD-10-CM | POA: Diagnosis not present

## 2020-01-18 DIAGNOSIS — Z7982 Long term (current) use of aspirin: Secondary | ICD-10-CM | POA: Diagnosis not present

## 2020-01-18 LAB — CMP (CANCER CENTER ONLY)
ALT: 17 U/L (ref 0–44)
AST: 16 U/L (ref 15–41)
Albumin: 3.5 g/dL (ref 3.5–5.0)
Alkaline Phosphatase: 115 U/L (ref 38–126)
Anion gap: 10 (ref 5–15)
BUN: 8 mg/dL (ref 8–23)
CO2: 29 mmol/L (ref 22–32)
Calcium: 9.5 mg/dL (ref 8.9–10.3)
Chloride: 104 mmol/L (ref 98–111)
Creatinine: 1.05 mg/dL (ref 0.61–1.24)
GFR, Est AFR Am: >60 mL/min (ref >60)
GFR, Estimated: >60 mL/min (ref >60)
Glucose, Bld: 121 mg/dL — ABNORMAL HIGH (ref 70–99)
Potassium: 3 mmol/L — CL (ref 3.5–5.1)
Sodium: 143 mmol/L (ref 135–145)
Total Bilirubin: 0.7 mg/dL (ref 0.3–1.2)
Total Protein: 6.2 g/dL — ABNORMAL LOW (ref 6.5–8.1)

## 2020-01-18 LAB — CBC WITH DIFFERENTIAL (CANCER CENTER ONLY)
Abs Immature Granulocytes: 0.03 10*3/uL (ref 0.00–0.07)
Basophils Absolute: 0.1 10*3/uL (ref 0.0–0.1)
Basophils Relative: 3 %
Eosinophils Absolute: 0 10*3/uL (ref 0.0–0.5)
Eosinophils Relative: 2 %
HCT: 28.5 % — ABNORMAL LOW (ref 39.0–52.0)
Hemoglobin: 9.3 g/dL — ABNORMAL LOW (ref 13.0–17.0)
Immature Granulocytes: 1 %
Lymphocytes Relative: 11 %
Lymphs Abs: 0.3 10*3/uL — ABNORMAL LOW (ref 0.7–4.0)
MCH: 32.3 pg (ref 26.0–34.0)
MCHC: 32.6 g/dL (ref 30.0–36.0)
MCV: 99 fL (ref 80.0–100.0)
Monocytes Absolute: 0.4 10*3/uL (ref 0.1–1.0)
Monocytes Relative: 15 %
Neutro Abs: 1.6 10*3/uL — ABNORMAL LOW (ref 1.7–7.7)
Neutrophils Relative %: 68 %
Platelet Count: 65 10*3/uL — ABNORMAL LOW (ref 150–400)
RBC: 2.88 MIL/uL — ABNORMAL LOW (ref 4.22–5.81)
RDW: 16.7 % — ABNORMAL HIGH (ref 11.5–15.5)
WBC Count: 2.3 10*3/uL — ABNORMAL LOW (ref 4.0–10.5)
nRBC: 0 % (ref 0.0–0.2)

## 2020-01-18 MED ORDER — SODIUM CHLORIDE 0.9% FLUSH
10.0000 mL | Freq: Once | INTRAVENOUS | Status: AC | PRN
  Administered 2020-01-18: 10 mL
  Filled 2020-01-18: qty 10

## 2020-01-18 MED ORDER — HEPARIN SOD (PORK) LOCK FLUSH 100 UNIT/ML IV SOLN
500.0000 [IU] | Freq: Once | INTRAVENOUS | Status: AC | PRN
  Administered 2020-01-18: 500 [IU]
  Filled 2020-01-18: qty 5

## 2020-01-18 MED ORDER — SODIUM CHLORIDE 0.9 % IV SOLN
Freq: Once | INTRAVENOUS | Status: AC
  Filled 2020-01-18: qty 250

## 2020-01-18 MED FILL — LIDOCAINE-PRILOCAINE 2.5-2.: 2.5-2.5 | 14 days supply | Qty: 30 | Fill #1

## 2020-01-18 NOTE — Patient Instructions (Signed)

## 2020-01-18 NOTE — Telephone Encounter (Signed)
Dr. Marin Olp notified of potassium-3.0. No new orders received at this time.

## 2020-01-21 DIAGNOSIS — T63451D Toxic effect of venom of hornets, accidental (unintentional), subsequent encounter: Secondary | ICD-10-CM | POA: Diagnosis not present

## 2020-01-21 DIAGNOSIS — T63461D Toxic effect of venom of wasps, accidental (unintentional), subsequent encounter: Secondary | ICD-10-CM | POA: Diagnosis not present

## 2020-01-22 ENCOUNTER — Other Ambulatory Visit: Payer: Self-pay | Admitting: Hematology & Oncology

## 2020-01-22 DIAGNOSIS — R059 Cough, unspecified: Secondary | ICD-10-CM

## 2020-01-22 DIAGNOSIS — C3491 Malignant neoplasm of unspecified part of right bronchus or lung: Secondary | ICD-10-CM

## 2020-01-25 ENCOUNTER — Inpatient Hospital Stay: Payer: 59

## 2020-01-25 ENCOUNTER — Other Ambulatory Visit: Payer: Self-pay

## 2020-01-25 ENCOUNTER — Other Ambulatory Visit: Payer: Self-pay | Admitting: Hematology & Oncology

## 2020-01-25 VITALS — BP 160/88 | HR 84 | Resp 17

## 2020-01-25 DIAGNOSIS — L03119 Cellulitis of unspecified part of limb: Secondary | ICD-10-CM | POA: Diagnosis not present

## 2020-01-25 DIAGNOSIS — E222 Syndrome of inappropriate secretion of antidiuretic hormone: Secondary | ICD-10-CM | POA: Diagnosis not present

## 2020-01-25 DIAGNOSIS — Z79899 Other long term (current) drug therapy: Secondary | ICD-10-CM | POA: Diagnosis not present

## 2020-01-25 DIAGNOSIS — C3491 Malignant neoplasm of unspecified part of right bronchus or lung: Secondary | ICD-10-CM | POA: Diagnosis not present

## 2020-01-25 DIAGNOSIS — Z7982 Long term (current) use of aspirin: Secondary | ICD-10-CM | POA: Diagnosis not present

## 2020-01-25 DIAGNOSIS — Z5189 Encounter for other specified aftercare: Secondary | ICD-10-CM | POA: Diagnosis not present

## 2020-01-25 DIAGNOSIS — Z8546 Personal history of malignant neoplasm of prostate: Secondary | ICD-10-CM | POA: Diagnosis not present

## 2020-01-25 DIAGNOSIS — Z5111 Encounter for antineoplastic chemotherapy: Secondary | ICD-10-CM | POA: Diagnosis not present

## 2020-01-25 DIAGNOSIS — Z85528 Personal history of other malignant neoplasm of kidney: Secondary | ICD-10-CM | POA: Diagnosis not present

## 2020-01-25 DIAGNOSIS — R059 Cough, unspecified: Secondary | ICD-10-CM

## 2020-01-25 LAB — CBC WITH DIFFERENTIAL (CANCER CENTER ONLY)
Abs Immature Granulocytes: 0.12 10*3/uL — ABNORMAL HIGH (ref 0.00–0.07)
Basophils Absolute: 0 10*3/uL (ref 0.0–0.1)
Basophils Relative: 0 %
Eosinophils Absolute: 0 10*3/uL (ref 0.0–0.5)
Eosinophils Relative: 0 %
HCT: 29.5 % — ABNORMAL LOW (ref 39.0–52.0)
Hemoglobin: 9.3 g/dL — ABNORMAL LOW (ref 13.0–17.0)
Immature Granulocytes: 2 %
Lymphocytes Relative: 4 %
Lymphs Abs: 0.3 10*3/uL — ABNORMAL LOW (ref 0.7–4.0)
MCH: 31.7 pg (ref 26.0–34.0)
MCHC: 31.5 g/dL (ref 30.0–36.0)
MCV: 100.7 fL — ABNORMAL HIGH (ref 80.0–100.0)
Monocytes Absolute: 0.4 10*3/uL (ref 0.1–1.0)
Monocytes Relative: 6 %
Neutro Abs: 6.6 10*3/uL (ref 1.7–7.7)
Neutrophils Relative %: 88 %
Platelet Count: 118 10*3/uL — ABNORMAL LOW (ref 150–400)
RBC: 2.93 MIL/uL — ABNORMAL LOW (ref 4.22–5.81)
RDW: 16.9 % — ABNORMAL HIGH (ref 11.5–15.5)
WBC Count: 7.5 10*3/uL (ref 4.0–10.5)
nRBC: 0 % (ref 0.0–0.2)

## 2020-01-25 LAB — CMP (CANCER CENTER ONLY)
ALT: 14 U/L (ref 0–44)
AST: 15 U/L (ref 15–41)
Albumin: 3.4 g/dL — ABNORMAL LOW (ref 3.5–5.0)
Alkaline Phosphatase: 122 U/L (ref 38–126)
Anion gap: 8 (ref 5–15)
BUN: 8 mg/dL (ref 8–23)
CO2: 31 mmol/L (ref 22–32)
Calcium: 9.4 mg/dL (ref 8.9–10.3)
Chloride: 105 mmol/L (ref 98–111)
Creatinine: 1.23 mg/dL (ref 0.61–1.24)
GFR, Est AFR Am: >60 mL/min (ref >60)
GFR, Estimated: >60 mL/min (ref >60)
Glucose, Bld: 143 mg/dL — ABNORMAL HIGH (ref 70–99)
Potassium: 3.4 mmol/L — ABNORMAL LOW (ref 3.5–5.1)
Sodium: 144 mmol/L (ref 135–145)
Total Bilirubin: 0.5 mg/dL (ref 0.3–1.2)
Total Protein: 5.8 g/dL — ABNORMAL LOW (ref 6.5–8.1)

## 2020-01-25 MED ORDER — SODIUM CHLORIDE 0.9 % IV SOLN
Freq: Once | INTRAVENOUS | Status: AC
  Filled 2020-01-25: qty 250

## 2020-01-25 MED ORDER — SODIUM CHLORIDE 0.9% FLUSH
10.0000 mL | INTRAVENOUS | Status: DC | PRN
  Administered 2020-01-25: 10 mL
  Filled 2020-01-25: qty 10

## 2020-01-25 MED ORDER — HEPARIN SOD (PORK) LOCK FLUSH 100 UNIT/ML IV SOLN
500.0000 [IU] | Freq: Once | INTRAVENOUS | Status: AC | PRN
  Administered 2020-01-25: 500 [IU]
  Filled 2020-01-25: qty 5

## 2020-01-25 MED ORDER — LORAZEPAM 1 MG PO TABS: 1.0000 mg | ORAL_TABLET | Freq: Four times a day (QID) | ORAL | 0 refills | Status: AC | PRN

## 2020-01-25 MED ORDER — HYDROCODONE-HOMATROPINE 5-1.5 MG/5ML PO SYRP: 5.0000 mL | ORAL_SOLUTION | ORAL | 0 refills | Status: DC | PRN

## 2020-01-25 MED FILL — TEMAZEPAM 30 MG CAPSULE: 30 | 30 days supply | Qty: 30 | Fill #2

## 2020-01-25 MED FILL — LORazepam 1 MG TABS: 1 | 15 days supply | Qty: 60 | Fill #0

## 2020-01-25 MED FILL — HYDROCODONE-HOMATROPINE SOL: 5-1.5 | 10 days supply | Qty: 300 | Fill #0

## 2020-01-25 NOTE — Patient Instructions (Signed)

## 2020-01-25 NOTE — Patient Instructions (Signed)

## 2020-02-02 ENCOUNTER — Inpatient Hospital Stay: Payer: 59

## 2020-02-02 ENCOUNTER — Encounter: Payer: Self-pay | Admitting: Hematology & Oncology

## 2020-02-02 ENCOUNTER — Inpatient Hospital Stay (HOSPITAL_BASED_OUTPATIENT_CLINIC_OR_DEPARTMENT_OTHER): Payer: 59 | Admitting: Hematology & Oncology

## 2020-02-02 ENCOUNTER — Inpatient Hospital Stay: Payer: 59 | Attending: Hematology & Oncology

## 2020-02-02 ENCOUNTER — Other Ambulatory Visit: Payer: Self-pay

## 2020-02-02 VITALS — BP 157/88 | HR 84 | Temp 98.2°F | Resp 18

## 2020-02-02 DIAGNOSIS — Z8546 Personal history of malignant neoplasm of prostate: Secondary | ICD-10-CM | POA: Diagnosis not present

## 2020-02-02 DIAGNOSIS — C3491 Malignant neoplasm of unspecified part of right bronchus or lung: Secondary | ICD-10-CM | POA: Diagnosis not present

## 2020-02-02 DIAGNOSIS — Z1589 Genetic susceptibility to other disease: Secondary | ICD-10-CM | POA: Diagnosis not present

## 2020-02-02 DIAGNOSIS — R05 Cough: Secondary | ICD-10-CM | POA: Diagnosis not present

## 2020-02-02 DIAGNOSIS — E876 Hypokalemia: Secondary | ICD-10-CM | POA: Insufficient documentation

## 2020-02-02 DIAGNOSIS — F17210 Nicotine dependence, cigarettes, uncomplicated: Secondary | ICD-10-CM | POA: Diagnosis not present

## 2020-02-02 DIAGNOSIS — Z7982 Long term (current) use of aspirin: Secondary | ICD-10-CM | POA: Diagnosis not present

## 2020-02-02 DIAGNOSIS — Z85528 Personal history of other malignant neoplasm of kidney: Secondary | ICD-10-CM | POA: Diagnosis not present

## 2020-02-02 DIAGNOSIS — Z79899 Other long term (current) drug therapy: Secondary | ICD-10-CM | POA: Insufficient documentation

## 2020-02-02 DIAGNOSIS — C7931 Secondary malignant neoplasm of brain: Secondary | ICD-10-CM | POA: Insufficient documentation

## 2020-02-02 DIAGNOSIS — Z5111 Encounter for antineoplastic chemotherapy: Secondary | ICD-10-CM | POA: Insufficient documentation

## 2020-02-02 DIAGNOSIS — E222 Syndrome of inappropriate secretion of antidiuretic hormone: Secondary | ICD-10-CM | POA: Diagnosis not present

## 2020-02-02 DIAGNOSIS — L03119 Cellulitis of unspecified part of limb: Secondary | ICD-10-CM

## 2020-02-02 LAB — CMP (CANCER CENTER ONLY)
ALT: 18 U/L (ref 0–44)
AST: 17 U/L (ref 15–41)
Albumin: 3.5 g/dL (ref 3.5–5.0)
Alkaline Phosphatase: 133 U/L — ABNORMAL HIGH (ref 38–126)
Anion gap: 8 (ref 5–15)
BUN: 13 mg/dL (ref 8–23)
CO2: 33 mmol/L — ABNORMAL HIGH (ref 22–32)
Calcium: 9.2 mg/dL (ref 8.9–10.3)
Chloride: 105 mmol/L (ref 98–111)
Creatinine: 1.06 mg/dL (ref 0.61–1.24)
GFR, Est AFR Am: >60 mL/min (ref >60)
GFR, Estimated: >60 mL/min (ref >60)
Glucose, Bld: 119 mg/dL — ABNORMAL HIGH (ref 70–99)
Potassium: 3.3 mmol/L — ABNORMAL LOW (ref 3.5–5.1)
Sodium: 146 mmol/L — ABNORMAL HIGH (ref 135–145)
Total Bilirubin: 0.4 mg/dL (ref 0.3–1.2)
Total Protein: 5.9 g/dL — ABNORMAL LOW (ref 6.5–8.1)

## 2020-02-02 LAB — CBC WITH DIFFERENTIAL (CANCER CENTER ONLY)
Abs Immature Granulocytes: 0.05 10*3/uL (ref 0.00–0.07)
Basophils Absolute: 0 10*3/uL (ref 0.0–0.1)
Basophils Relative: 1 %
Eosinophils Absolute: 0 10*3/uL (ref 0.0–0.5)
Eosinophils Relative: 0 %
HCT: 31.3 % — ABNORMAL LOW (ref 39.0–52.0)
Hemoglobin: 9.9 g/dL — ABNORMAL LOW (ref 13.0–17.0)
Immature Granulocytes: 1 %
Lymphocytes Relative: 5 %
Lymphs Abs: 0.4 10*3/uL — ABNORMAL LOW (ref 0.7–4.0)
MCH: 32.1 pg (ref 26.0–34.0)
MCHC: 31.6 g/dL (ref 30.0–36.0)
MCV: 101.6 fL — ABNORMAL HIGH (ref 80.0–100.0)
Monocytes Absolute: 0.7 10*3/uL (ref 0.1–1.0)
Monocytes Relative: 10 %
Neutro Abs: 5.9 10*3/uL (ref 1.7–7.7)
Neutrophils Relative %: 83 %
Platelet Count: 170 10*3/uL (ref 150–400)
RBC: 3.08 MIL/uL — ABNORMAL LOW (ref 4.22–5.81)
RDW: 16.9 % — ABNORMAL HIGH (ref 11.5–15.5)
WBC Count: 7.1 10*3/uL (ref 4.0–10.5)
nRBC: 0 % (ref 0.0–0.2)

## 2020-02-02 LAB — LACTATE DEHYDROGENASE: LDH: 363 U/L — ABNORMAL HIGH (ref 98–192)

## 2020-02-02 MED ORDER — HEPARIN SOD (PORK) LOCK FLUSH 100 UNIT/ML IV SOLN
250.0000 [IU] | Freq: Once | INTRAVENOUS | Status: DC | PRN
  Filled 2020-02-02: qty 5

## 2020-02-02 MED ORDER — SODIUM CHLORIDE 0.9 % IV SOLN
Freq: Once | INTRAVENOUS | Status: AC
  Filled 2020-02-02: qty 250

## 2020-02-02 MED ORDER — SODIUM CHLORIDE 0.9% FLUSH
3.0000 mL | Freq: Once | INTRAVENOUS | Status: DC | PRN
  Filled 2020-02-02: qty 10

## 2020-02-02 MED ORDER — PEGFILGRASTIM 6 MG/0.6ML ~~LOC~~ PSKT
6.0000 mg | PREFILLED_SYRINGE | Freq: Once | SUBCUTANEOUS | Status: AC
  Administered 2020-02-02: 6 mg via SUBCUTANEOUS

## 2020-02-02 MED ORDER — HEPARIN SOD (PORK) LOCK FLUSH 100 UNIT/ML IV SOLN
500.0000 [IU] | Freq: Once | INTRAVENOUS | Status: AC | PRN
  Administered 2020-02-02: 500 [IU]
  Filled 2020-02-02: qty 5

## 2020-02-02 MED ORDER — PEGFILGRASTIM 6 MG/0.6ML ~~LOC~~ PSKT
PREFILLED_SYRINGE | SUBCUTANEOUS | Status: AC
  Filled 2020-02-02: qty 0.6

## 2020-02-02 MED ORDER — ALTEPLASE 2 MG IJ SOLR
2.0000 mg | Freq: Once | INTRAMUSCULAR | Status: DC | PRN
  Filled 2020-02-02: qty 2

## 2020-02-02 MED ORDER — HEPARIN SOD (PORK) LOCK FLUSH 100 UNIT/ML IV SOLN
500.0000 [IU] | Freq: Once | INTRAVENOUS | Status: DC | PRN
  Filled 2020-02-02: qty 5

## 2020-02-02 MED ORDER — SODIUM CHLORIDE 0.9% FLUSH
10.0000 mL | INTRAVENOUS | Status: DC | PRN
  Administered 2020-02-02: 10 mL
  Filled 2020-02-02: qty 10

## 2020-02-02 MED ORDER — SODIUM CHLORIDE 0.9% FLUSH
10.0000 mL | Freq: Once | INTRAVENOUS | Status: DC | PRN
  Filled 2020-02-02: qty 10

## 2020-02-02 MED ORDER — SODIUM CHLORIDE 0.9 % IV SOLN
10.0000 mg | Freq: Once | INTRAVENOUS | Status: AC
  Administered 2020-02-02: 10 mg via INTRAVENOUS
  Filled 2020-02-02: qty 10

## 2020-02-02 MED ORDER — SODIUM CHLORIDE 0.9 % IV SOLN
67.5000 mg/m2 | Freq: Once | INTRAVENOUS | Status: AC
  Administered 2020-02-02: 140 mg via INTRAVENOUS
  Filled 2020-02-02: qty 14

## 2020-02-02 NOTE — Patient Instructions (Signed)

## 2020-02-02 NOTE — Patient Instructions (Signed)
Docetaxel injection What is this medicine? DOCETAXEL (doe se TAX el) is a chemotherapy drug. It targets fast dividing cells, like cancer cells, and causes these cells to die. This medicine is used to treat many types of cancers like breast cancer, certain stomach cancers, head and neck cancer, lung cancer, and prostate cancer. This medicine may be used for other purposes; ask your health care provider or pharmacist if you have questions. COMMON BRAND NAME(S): Docefrez, Taxotere What should I tell my health care provider before I take this medicine? They need to know if you have any of these conditions:  infection (especially a virus infection such as chickenpox, cold sores, or herpes)  liver disease  low blood counts, like low white cell, platelet, or red cell counts  an unusual or allergic reaction to docetaxel, polysorbate 80, other chemotherapy agents, other medicines, foods, dyes, or preservatives  pregnant or trying to get pregnant  breast-feeding How should I use this medicine? This drug is given as an infusion into a vein. It is administered in a hospital or clinic by a specially trained health care professional. Talk to your pediatrician regarding the use of this medicine in children. Special care may be needed. Overdosage: If you think you have taken too much of this medicine contact a poison control center or emergency room at once. NOTE: This medicine is only for you. Do not share this medicine with others. What if I miss a dose? It is important not to miss your dose. Call your doctor or health care professional if you are unable to keep an appointment. What may interact with this medicine?  aprepitant  certain antibiotics like erythromycin or clarithromycin  certain antivirals for HIV or hepatitis  certain medicines for fungal infections like fluconazole, itraconazole, ketoconazole, posaconazole, or  voriconazole  cimetidine  ciprofloxacin  conivaptan  cyclosporine  dronedarone  fluvoxamine  grapefruit juice  imatinib  verapamil This list may not describe all possible interactions. Give your health care provider a list of all the medicines, herbs, non-prescription drugs, or dietary supplements you use. Also tell them if you smoke, drink alcohol, or use illegal drugs. Some items may interact with your medicine. What should I watch for while using this medicine? Your condition will be monitored carefully while you are receiving this medicine. You will need important blood work done while you are taking this medicine. Call your doctor or health care professional for advice if you get a fever, chills or sore throat, or other symptoms of a cold or flu. Do not treat yourself. This drug decreases your body's ability to fight infections. Try to avoid being around people who are sick. Some products may contain alcohol. Ask your health care professional if this medicine contains alcohol. Be sure to tell all health care professionals you are taking this medicine. Certain medicines, like metronidazole and disulfiram, can cause an unpleasant reaction when taken with alcohol. The reaction includes flushing, headache, nausea, vomiting, sweating, and increased thirst. The reaction can last from 30 minutes to several hours. You may get drowsy or dizzy. Do not drive, use machinery, or do anything that needs mental alertness until you know how this medicine affects you. Do not stand or sit up quickly, especially if you are an older patient. This reduces the risk of dizzy or fainting spells. Alcohol may interfere with the effect of this medicine. Talk to your health care professional about your risk of cancer. You may be more at risk for certain types of cancer if  you take this medicine. Do not become pregnant while taking this medicine or for 6 months after stopping it. Women should inform their doctor if  they wish to become pregnant or think they might be pregnant. There is a potential for serious side effects to an unborn child. Talk to your health care professional or pharmacist for more information. Do not breast-feed an infant while taking this medicine or for 1 week after stopping it. Males who get this medicine must use a condom during sex with females who can get pregnant. If you get a woman pregnant, the baby could have birth defects. The baby could die before they are born. You will need to continue wearing a condom for 3 months after stopping the medicine. Tell your health care provider right away if your partner becomes pregnant while you are taking this medicine. This may interfere with the ability to father a child. You should talk to your doctor or health care professional if you are concerned about your fertility. What side effects may I notice from receiving this medicine? Side effects that you should report to your doctor or health care professional as soon as possible:  allergic reactions like skin rash, itching or hives, swelling of the face, lips, or tongue  blurred vision  breathing problems  changes in vision  low blood counts - This drug may decrease the number of white blood cells, red blood cells and platelets. You may be at increased risk for infections and bleeding.  nausea and vomiting  pain, redness or irritation at site where injected  pain, tingling, numbness in the hands or feet  redness, blistering, peeling, or loosening of the skin, including inside the mouth  signs of decreased platelets or bleeding - bruising, pinpoint red spots on the skin, black, tarry stools, nosebleeds  signs of decreased red blood cells - unusually weak or tired, fainting spells, lightheadedness  signs of infection - fever or chills, cough, sore throat, pain or difficulty passing urine  swelling of the ankle, feet, hands Side effects that usually do not require medical attention  (report to your doctor or health care professional if they continue or are bothersome):  constipation  diarrhea  fingernail or toenail changes  hair loss  loss of appetite  mouth sores  muscle pain This list may not describe all possible side effects. Call your doctor for medical advice about side effects. You may report side effects to FDA at 1-800-FDA-1088. Where should I keep my medicine? This drug is given in a hospital or clinic and will not be stored at home. NOTE: This sheet is a summary. It may not cover all possible information. If you have questions about this medicine, talk to your doctor, pharmacist, or health care provider.  2020 Elsevier/Gold Standard (2019-03-12 10:19:06)

## 2020-02-02 NOTE — Progress Notes (Signed)
Hematology and Oncology Follow Up Visit  Taylor Elliott [id number] Sep 07, 1950 69 y.o. 02/02/2020   Principle Diagnosis:   Extensive stage small cell lung cancer  SIADH secondary to small cell lung cancer  Current Therapy:           Carboplatinum/etoposide/Tecentriq-cycle #6  Atezolizumab 1200 mg IV q 3 week -- maintenance -  Start 01/21/2019  XRT for CNS mets -- completed on 03/27/2019  Lurbinectedin -- s/p cycle #4-- started on 05/07/2019 -- d/c on 09/03/2019 due to progression  CDDP/Irinotecan -- s/p cycle #3- started on 09/11/2019 -- d/c on 12/16/2019  Taxotere -- s/p cycle #2 on 11/30/2019     Interim History:  Taylor Elliott is back for follow-up.  Unfortunately, it seems like he might be having little more trouble with respect to the breathing.  He just sounds more congested.  He has a nebulizer.  He says he is using it once a day.  I told him that he can go up to 3 or 4 times a day.  He and his wife just got back from [place].  They were in [place] visiting his 106 year old dad.  His father is still independent.  He is still smoking a little bit.  He says maybe 6-7 cigarettes a day.  His demeclocycline is helping the hyponatremia.  I told him to take the demeclocycline once a day.  His appetite is doing well.  He is having no problems with diarrhea.  There is no tingling in the hands or feet.  He has had no bleeding.  Para he does have little bit of a cough.  Its not that productive.  Overall, I would say his performance status is ECOG 1.      Medications:  Current Outpatient Medications:  .  amLODipine (NORVASC) 10 MG tablet, Take 10 mg by mouth daily. (Patient not taking: Reported on 01/12/2020), Disp: , Rfl:  .  aspirin EC 81 MG tablet, Take 81 mg by mouth daily., Disp: , Rfl:  .  calcium carbonate (TUMS - DOSED IN MG ELEMENTAL CALCIUM) 500 MG chewable tablet, Chew 2 tablets by mouth daily as needed for indigestion or heartburn. , Disp: , Rfl:  .   ciprofloxacin-dexamethasone (CIPRODEX) OTIC suspension, Place 5 drops into both ears as needed., Disp: , Rfl:  .  demeclocycline (DECLOMYCIN) 150 MG tablet, TAKE 2 TABLETS BY MOUTH TWICE DAILY, Disp: 120 tablet, Rfl: 2 .  dronabinol (MARINOL) 5 MG capsule, Take 1 capsule (5 mg total) by mouth 2 (two) times daily before a meal., Disp: 60 capsule, Rfl: 1 .  EPINEPHrine 0.3 mg/0.3 mL IJ SOAJ injection, Inject 0.3 mg into the muscle once., Disp: , Rfl:  .  fenofibrate 160 MG tablet, TAKE 1 TABLET (160 MG TOTAL) BY MOUTH DAILY., Disp: 90 tablet, Rfl: 1 .  HYDROcodone-homatropine (HYCODAN) 5-1.5 MG/5ML syrup, TAKE 5 MLS BY MOUTH EVERY 4 (FOUR) HOURS AS NEEDED FOR COUGH., Disp: 300 mL, Rfl: 0 .  ipratropium-albuterol (DUONEB) 0.5-2.5 (3) MG/3ML SOLN, Take 3 mLs by nebulization every 6 (six) hours as needed., Disp: 360 mL, Rfl: 6 .  lactulose (CHRONULAC) 10 GM/15ML solution, Take 15 mLs (10 g total) by mouth 2 (two) times daily as needed for mild constipation., Disp: 473 mL, Rfl: 1 .  lidocaine-prilocaine (EMLA) cream, Apply topically once., Disp: , Rfl:  .  LORazepam (ATIVAN) 1 MG tablet, Take 1 tablet (1 mg total) by mouth every 6 (six) hours as needed for anxiety., Disp: 60 tablet, Rfl:  0 .  Multiple Vitamin (MULTIVITAMIN) tablet, Take 1 tablet by mouth daily., Disp: , Rfl:  .  pyridoxine (B-6) 200 MG tablet, Take 200 mg by mouth daily., Disp: , Rfl:  .  solifenacin (VESICARE) 10 MG tablet, Take 10 mg by mouth every evening., Disp: , Rfl:  .  temazepam (RESTORIL) 30 MG capsule, TAKE 1 CAPSULE (30 MG TOTAL) BY MOUTH AT BEDTIME AS NEEDED FOR SLEEP., Disp: 30 capsule, Rfl: 2 .  triamcinolone (NASACORT) 55 MCG/ACT AERO nasal inhaler, , Disp: , Rfl:  .  UNABLE TO FIND, Med Name: Allergy shots., Disp: , Rfl:  No current facility-administered medications for this visit.  Facility-Administered Medications Ordered in Other Visits:  .  alteplase (CATHFLO ACTIVASE) injection 2 mg, 2 mg, Intracatheter, Once PRN,  [place], Sarah M, NP .  heparin lock flush 100 unit/mL, 500 Units, Intracatheter, Once PRN, [place], Sarah M, NP .  heparin lock flush 100 unit/mL, 250 Units, Intracatheter, Once PRN, [place], Sarah M, NP .  sodium chloride flush (NS) 0.9 % injection 10 mL, 10 mL, Intracatheter, Once PRN, [place], Sarah M, NP .  sodium chloride flush (NS) 0.9 % injection 3 mL, 3 mL, Intracatheter, Once PRN, [place], Holli Humbles, NP  Allergies:  Allergies  Allergen Reactions  . Bee Venom Anaphylaxis  . Clindamycin/Lincomycin Hives    Past Medical History, Surgical history, Social history, and Family History were reviewed and updated.  Review of Systems: Review of Systems  Constitutional: Negative.   HENT:  Negative.   Eyes: Negative.   Respiratory: Positive for cough.   Cardiovascular: Negative.   Gastrointestinal: Negative.   Endocrine: Negative.   Genitourinary: Negative.    Musculoskeletal: Positive for back pain, myalgias and neck pain.  Skin: Positive for itching and rash.  Neurological: Negative.   Hematological: Negative.   Psychiatric/Behavioral: Negative.     Physical Exam:  oral temperature is 98.2 F (36.8 C). His blood pressure is 157/88 (abnormal) and his pulse is 84. His respiration is 18 and oxygen saturation is 100%.   Wt Readings from Last 3 Encounters:  01/12/20 191 lb 6.4 oz (86.8 kg)  12/24/19 193 lb (87.5 kg)  12/21/19 193 lb (87.5 kg)    Physical Exam Vitals reviewed.  HENT:     Head: Normocephalic and atraumatic.  Eyes:     Pupils: Pupils are equal, round, and reactive to light.  Cardiovascular:     Rate and Rhythm: Normal rate and regular rhythm.     Heart sounds: Normal heart sounds.  Pulmonary:     Effort: Pulmonary effort is normal.     Breath sounds: Normal breath sounds.  Abdominal:     General: Bowel sounds are normal.     Palpations: Abdomen is soft.     Comments: Abdominal exam shows a slightly obese abdomen.  He does have an umbilical  hernia.  I really cannot palpate his liver at this point.  There is no fluid wave.  There is no inguinal adenopathy.  There is no splenomegaly.    Musculoskeletal:        General: No tenderness or deformity. Normal range of motion.     Cervical back: Normal range of motion.  Lymphadenopathy:     Cervical: No cervical adenopathy.  Skin:    General: Skin is warm and dry.     Findings: No erythema or rash.  Neurological:     Mental Status: He is alert and oriented to person, place, and time.  Psychiatric:  Behavior: Behavior normal.        Thought Content: Thought content normal.        Judgment: Judgment normal.      Lab Results  Component Value Date   WBC 7.1 02/02/2020   HGB 9.9 (L) 02/02/2020   HCT 31.3 (L) 02/02/2020   MCV 101.6 (H) 02/02/2020   PLT 170 02/02/2020     Chemistry      Component Value Date/Time   NA 146 (H) 02/02/2020 1010   NA 135 (L) 07/12/2015 0813   K 3.3 (L) 02/02/2020 1010   K 4.5 07/12/2015 0813   CL 105 02/02/2020 1010   CO2 33 (H) 02/02/2020 1010   CO2 24 07/12/2015 0813   BUN 13 02/02/2020 1010   BUN 15.7 07/12/2015 0813   CREATININE 1.06 02/02/2020 1010   CREATININE 1.6 (H) 07/12/2015 0813      Component Value Date/Time   CALCIUM 9.2 02/02/2020 1010   CALCIUM 10.1 07/12/2015 0813   ALKPHOS 133 (H) 02/02/2020 1010   ALKPHOS 115 07/12/2015 0813   AST 17 02/02/2020 1010   AST 22 07/12/2015 0813   ALT 18 02/02/2020 1010   ALT 24 07/12/2015 0813   BILITOT 0.4 02/02/2020 1010   BILITOT 0.46 07/12/2015 0813       Impression and Plan: Mr. Vandeberg is a 69 year old white male.  He has a past history of renal cell carcinoma and also prostate cancer.  Now, he has a third malignancy.  This is metastatic small cell lung cancer.  It is recurrent.  For right now, we will have to go with a PET scan after this cycle of treatment.  For some reason, it would not surprise me if we find that the cancer is progressing.  If there is progression,  I think one option might be CAV protocol.  I know he is trying very hard.  We will set him up with fluids.  He gets this weekly.              Volanda Napoleon, MD 7/6/202111:13 AM

## 2020-02-08 ENCOUNTER — Telehealth: Payer: Self-pay

## 2020-02-08 ENCOUNTER — Other Ambulatory Visit: Payer: Self-pay

## 2020-02-08 ENCOUNTER — Inpatient Hospital Stay: Payer: 59

## 2020-02-08 ENCOUNTER — Other Ambulatory Visit: Payer: Self-pay | Admitting: Family

## 2020-02-08 ENCOUNTER — Other Ambulatory Visit: Payer: Self-pay | Admitting: *Deleted

## 2020-02-08 VITALS — BP 162/77 | HR 105 | Temp 97.7°F | Resp 18

## 2020-02-08 DIAGNOSIS — C7931 Secondary malignant neoplasm of brain: Secondary | ICD-10-CM | POA: Diagnosis not present

## 2020-02-08 DIAGNOSIS — E876 Hypokalemia: Secondary | ICD-10-CM

## 2020-02-08 DIAGNOSIS — F17210 Nicotine dependence, cigarettes, uncomplicated: Secondary | ICD-10-CM | POA: Diagnosis not present

## 2020-02-08 DIAGNOSIS — L03119 Cellulitis of unspecified part of limb: Secondary | ICD-10-CM

## 2020-02-08 DIAGNOSIS — Z8546 Personal history of malignant neoplasm of prostate: Secondary | ICD-10-CM | POA: Diagnosis not present

## 2020-02-08 DIAGNOSIS — E222 Syndrome of inappropriate secretion of antidiuretic hormone: Secondary | ICD-10-CM | POA: Diagnosis not present

## 2020-02-08 DIAGNOSIS — Z1589 Genetic susceptibility to other disease: Secondary | ICD-10-CM | POA: Diagnosis not present

## 2020-02-08 DIAGNOSIS — R05 Cough: Secondary | ICD-10-CM | POA: Diagnosis not present

## 2020-02-08 DIAGNOSIS — C649 Malignant neoplasm of unspecified kidney, except renal pelvis: Secondary | ICD-10-CM

## 2020-02-08 DIAGNOSIS — C3491 Malignant neoplasm of unspecified part of right bronchus or lung: Secondary | ICD-10-CM | POA: Diagnosis not present

## 2020-02-08 DIAGNOSIS — Z5111 Encounter for antineoplastic chemotherapy: Secondary | ICD-10-CM | POA: Diagnosis not present

## 2020-02-08 LAB — CBC WITH DIFFERENTIAL (CANCER CENTER ONLY)
Abs Immature Granulocytes: 0.03 10*3/uL (ref 0.00–0.07)
Basophils Absolute: 0.1 10*3/uL (ref 0.0–0.1)
Basophils Relative: 3 %
Eosinophils Absolute: 0 10*3/uL (ref 0.0–0.5)
Eosinophils Relative: 2 %
HCT: 30.3 % — ABNORMAL LOW (ref 39.0–52.0)
Hemoglobin: 9.6 g/dL — ABNORMAL LOW (ref 13.0–17.0)
Immature Granulocytes: 2 %
Lymphocytes Relative: 9 %
Lymphs Abs: 0.2 10*3/uL — ABNORMAL LOW (ref 0.7–4.0)
MCH: 32.1 pg (ref 26.0–34.0)
MCHC: 31.7 g/dL (ref 30.0–36.0)
MCV: 101.3 fL — ABNORMAL HIGH (ref 80.0–100.0)
Monocytes Absolute: 0.3 10*3/uL (ref 0.1–1.0)
Monocytes Relative: 16 %
Neutro Abs: 1.4 10*3/uL — ABNORMAL LOW (ref 1.7–7.7)
Neutrophils Relative %: 68 %
Platelet Count: 88 10*3/uL — ABNORMAL LOW (ref 150–400)
RBC: 2.99 MIL/uL — ABNORMAL LOW (ref 4.22–5.81)
RDW: 16.1 % — ABNORMAL HIGH (ref 11.5–15.5)
WBC Count: 2 10*3/uL — ABNORMAL LOW (ref 4.0–10.5)
nRBC: 0 % (ref 0.0–0.2)

## 2020-02-08 LAB — COMPREHENSIVE METABOLIC PANEL
ALT: 23 U/L (ref 0–44)
AST: 19 U/L (ref 15–41)
Albumin: 3.5 g/dL (ref 3.5–5.0)
Alkaline Phosphatase: 127 U/L — ABNORMAL HIGH (ref 38–126)
Anion gap: 8 (ref 5–15)
BUN: 11 mg/dL (ref 8–23)
CO2: 32 mmol/L (ref 22–32)
Calcium: 9.3 mg/dL (ref 8.9–10.3)
Chloride: 104 mmol/L (ref 98–111)
Creatinine, Ser: 1.06 mg/dL (ref 0.61–1.24)
GFR calc Af Amer: >60 mL/min (ref >60)
GFR calc non Af Amer: >60 mL/min (ref >60)
Glucose, Bld: 136 mg/dL — ABNORMAL HIGH (ref 70–99)
Potassium: 2.9 mmol/L — CL (ref 3.5–5.1)
Sodium: 144 mmol/L (ref 135–145)
Total Bilirubin: 0.5 mg/dL (ref 0.3–1.2)
Total Protein: 5.7 g/dL — ABNORMAL LOW (ref 6.5–8.1)

## 2020-02-08 MED ORDER — POTASSIUM CHLORIDE CRYS ER 20 MEQ PO TBCR: 20.0000 meq | EXTENDED_RELEASE_TABLET | Freq: Every day | ORAL | 0 refills | Status: DC

## 2020-02-08 MED ORDER — SODIUM CHLORIDE 0.9 % IV SOLN
Freq: Once | INTRAVENOUS | Status: AC
  Filled 2020-02-08: qty 250

## 2020-02-08 MED ORDER — SODIUM CHLORIDE 0.9% FLUSH
10.0000 mL | Freq: Once | INTRAVENOUS | Status: DC | PRN
  Filled 2020-02-08: qty 10

## 2020-02-08 MED ORDER — SODIUM CHLORIDE 0.9 % IV SOLN
Freq: Once | INTRAVENOUS | Status: AC
  Filled 2020-02-08: qty 1000

## 2020-02-08 MED ORDER — HEPARIN SOD (PORK) LOCK FLUSH 100 UNIT/ML IV SOLN
500.0000 [IU] | Freq: Once | INTRAVENOUS | Status: DC | PRN
  Filled 2020-02-08: qty 5

## 2020-02-08 MED FILL — POTASSIUM CHLORIDE CRYS ER: 20 | 13 days supply | Qty: 15 | Fill #0

## 2020-02-08 NOTE — Patient Instructions (Signed)
Implanted Port Insertion, Care After °This sheet gives you information about how to care for yourself after your procedure. Your health care provider may also give you more specific instructions. If you have problems or questions, contact your health care provider. °What can I expect after the procedure? °After the procedure, it is common to have: °· Discomfort at the port insertion site. °· Bruising on the skin over the port. This should improve over 3-4 days. °Follow these instructions at home: °Port care °· After your port is placed, you will get a manufacturer's information card. The card has information about your port. Keep this card with you at all times. °· Take care of the port as told by your health care provider. Ask your health care provider if you or a family member can get training for taking care of the port at home. A home health care nurse may also take care of the port. °· Make sure to remember what type of port you have. °Incision care ° °  ° °· Follow instructions from your health care provider about how to take care of your port insertion site. Make sure you: °? Wash your hands with soap and water before and after you change your bandage (dressing). If soap and water are not available, use hand sanitizer. °? Change your dressing as told by your health care provider. °? Leave stitches (sutures), skin glue, or adhesive strips in place. These skin closures may need to stay in place for 2 weeks or longer. If adhesive strip edges start to loosen and curl up, you may trim the loose edges. Do not remove adhesive strips completely unless your health care provider tells you to do that. °· Check your port insertion site every day for signs of infection. Check for: °? Redness, swelling, or pain. °? Fluid or blood. °? Warmth. °? Pus or a bad smell. °Activity °· Return to your normal activities as told by your health care provider. Ask your health care provider what activities are safe for you. °· Do not  lift anything that is heavier than 10 lb (4.5 kg), or the limit that you are told, until your health care provider says that it is safe. °General instructions °· Take over-the-counter and prescription medicines only as told by your health care provider. °· Do not take baths, swim, or use a hot tub until your health care provider approves. Ask your health care provider if you may take showers. You may only be allowed to take sponge baths. °· Do not drive for 24 hours if you were given a sedative during your procedure. °· Wear a medical alert bracelet in case of an emergency. This will tell any health care providers that you have a port. °· Keep all follow-up visits as told by your health care provider. This is important. °Contact a health care provider if: °· You cannot flush your port with saline as directed, or you cannot draw blood from the port. °· You have a fever or chills. °· You have redness, swelling, or pain around your port insertion site. °· You have fluid or blood coming from your port insertion site. °· Your port insertion site feels warm to the touch. °· You have pus or a bad smell coming from the port insertion site. °Get help right away if: °· You have chest pain or shortness of breath. °· You have bleeding from your port that you cannot control. °Summary °· Take care of the port as told by your health   care provider. Keep the manufacturer's information card with you at all times. °· Change your dressing as told by your health care provider. °· Contact a health care provider if you have a fever or chills or if you have redness, swelling, or pain around your port insertion site. °· Keep all follow-up visits as told by your health care provider. °This information is not intended to replace advice given to you by your health care provider. Make sure you discuss any questions you have with your health care provider. °Document Revised: 02/11/2018 Document Reviewed: 02/11/2018 °Elsevier Patient Education ©  2020 Elsevier Inc. ° °

## 2020-02-08 NOTE — Telephone Encounter (Signed)
Judson Roch, NP aware of panic low K+. Orders placed for IV K+ today & script sent to pt's pharmacy. dph

## 2020-02-08 NOTE — Patient Instructions (Signed)

## 2020-02-09 ENCOUNTER — Encounter: Payer: Self-pay | Admitting: *Deleted

## 2020-02-15 ENCOUNTER — Encounter: Payer: Self-pay | Admitting: Hematology & Oncology

## 2020-02-15 ENCOUNTER — Inpatient Hospital Stay (HOSPITAL_BASED_OUTPATIENT_CLINIC_OR_DEPARTMENT_OTHER): Payer: 59 | Admitting: Hematology & Oncology

## 2020-02-15 ENCOUNTER — Telehealth: Payer: Self-pay | Admitting: *Deleted

## 2020-02-15 ENCOUNTER — Other Ambulatory Visit: Payer: Self-pay | Admitting: Family

## 2020-02-15 ENCOUNTER — Other Ambulatory Visit: Payer: Self-pay | Admitting: *Deleted

## 2020-02-15 ENCOUNTER — Inpatient Hospital Stay: Payer: 59

## 2020-02-15 ENCOUNTER — Other Ambulatory Visit: Payer: Self-pay

## 2020-02-15 VITALS — BP 160/82 | HR 82 | Temp 98.2°F | Resp 18 | Wt 191.0 lb

## 2020-02-15 VITALS — BP 181/91 | HR 74 | Resp 19

## 2020-02-15 DIAGNOSIS — E876 Hypokalemia: Secondary | ICD-10-CM | POA: Diagnosis not present

## 2020-02-15 DIAGNOSIS — C7931 Secondary malignant neoplasm of brain: Secondary | ICD-10-CM | POA: Diagnosis not present

## 2020-02-15 DIAGNOSIS — F17210 Nicotine dependence, cigarettes, uncomplicated: Secondary | ICD-10-CM | POA: Diagnosis not present

## 2020-02-15 DIAGNOSIS — C3491 Malignant neoplasm of unspecified part of right bronchus or lung: Secondary | ICD-10-CM | POA: Diagnosis not present

## 2020-02-15 DIAGNOSIS — Z8546 Personal history of malignant neoplasm of prostate: Secondary | ICD-10-CM | POA: Diagnosis not present

## 2020-02-15 DIAGNOSIS — R05 Cough: Secondary | ICD-10-CM | POA: Diagnosis not present

## 2020-02-15 DIAGNOSIS — E222 Syndrome of inappropriate secretion of antidiuretic hormone: Secondary | ICD-10-CM | POA: Diagnosis not present

## 2020-02-15 DIAGNOSIS — Z1589 Genetic susceptibility to other disease: Secondary | ICD-10-CM | POA: Diagnosis not present

## 2020-02-15 DIAGNOSIS — L03119 Cellulitis of unspecified part of limb: Secondary | ICD-10-CM

## 2020-02-15 DIAGNOSIS — Z5111 Encounter for antineoplastic chemotherapy: Secondary | ICD-10-CM | POA: Diagnosis not present

## 2020-02-15 LAB — CMP (CANCER CENTER ONLY)
ALT: 30 U/L (ref 0–44)
AST: 29 U/L (ref 15–41)
Albumin: 3.7 g/dL (ref 3.5–5.0)
Alkaline Phosphatase: 150 U/L — ABNORMAL HIGH (ref 38–126)
Anion gap: 10 (ref 5–15)
BUN: 9 mg/dL (ref 8–23)
CO2: 33 mmol/L — ABNORMAL HIGH (ref 22–32)
Calcium: 9.3 mg/dL (ref 8.9–10.3)
Chloride: 101 mmol/L (ref 98–111)
Creatinine: 1.04 mg/dL (ref 0.61–1.24)
GFR, Est AFR Am: >60 mL/min (ref >60)
GFR, Estimated: >60 mL/min (ref >60)
Glucose, Bld: 169 mg/dL — ABNORMAL HIGH (ref 70–99)
Potassium: 2.9 mmol/L — CL (ref 3.5–5.1)
Sodium: 144 mmol/L (ref 135–145)
Total Bilirubin: 0.4 mg/dL (ref 0.3–1.2)
Total Protein: 6 g/dL — ABNORMAL LOW (ref 6.5–8.1)

## 2020-02-15 LAB — CBC WITH DIFFERENTIAL (CANCER CENTER ONLY)
Abs Immature Granulocytes: 0.19 10*3/uL — ABNORMAL HIGH (ref 0.00–0.07)
Basophils Absolute: 0 10*3/uL (ref 0.0–0.1)
Basophils Relative: 0 %
Eosinophils Absolute: 0 10*3/uL (ref 0.0–0.5)
Eosinophils Relative: 0 %
HCT: 33.1 % — ABNORMAL LOW (ref 39.0–52.0)
Hemoglobin: 10.5 g/dL — ABNORMAL LOW (ref 13.0–17.0)
Immature Granulocytes: 2 %
Lymphocytes Relative: 3 %
Lymphs Abs: 0.3 10*3/uL — ABNORMAL LOW (ref 0.7–4.0)
MCH: 32 pg (ref 26.0–34.0)
MCHC: 31.7 g/dL (ref 30.0–36.0)
MCV: 100.9 fL — ABNORMAL HIGH (ref 80.0–100.0)
Monocytes Absolute: 0.4 10*3/uL (ref 0.1–1.0)
Monocytes Relative: 5 %
Neutro Abs: 8.3 10*3/uL — ABNORMAL HIGH (ref 1.7–7.7)
Neutrophils Relative %: 90 %
Platelet Count: 110 10*3/uL — ABNORMAL LOW (ref 150–400)
RBC: 3.28 MIL/uL — ABNORMAL LOW (ref 4.22–5.81)
RDW: 16.5 % — ABNORMAL HIGH (ref 11.5–15.5)
WBC Count: 9.3 10*3/uL (ref 4.0–10.5)
nRBC: 0 % (ref 0.0–0.2)

## 2020-02-15 MED ORDER — POTASSIUM CHLORIDE CRYS ER 20 MEQ PO TBCR
40.0000 meq | EXTENDED_RELEASE_TABLET | Freq: Once | ORAL | Status: AC
  Administered 2020-02-15: 40 meq via ORAL
  Filled 2020-02-15: qty 2

## 2020-02-15 MED ORDER — SODIUM CHLORIDE 0.9 % IV SOLN
Freq: Once | INTRAVENOUS | Status: AC
  Filled 2020-02-15: qty 100

## 2020-02-15 MED ORDER — DIPHENOXYLATE-ATROPINE 2.5-0.025 MG PO TABS
1.0000 | ORAL_TABLET | Freq: Four times a day (QID) | ORAL | 0 refills | Status: AC | PRN
Start: 2020-02-15 — End: ?

## 2020-02-15 MED ORDER — SODIUM CHLORIDE 0.9 % IV SOLN
Freq: Once | INTRAVENOUS | Status: AC
  Filled 2020-02-15: qty 250

## 2020-02-15 MED ORDER — POTASSIUM CHLORIDE CRYS ER 20 MEQ PO TBCR
20.0000 meq | EXTENDED_RELEASE_TABLET | Freq: Three times a day (TID) | ORAL | 1 refills | Status: DC
Start: 2020-02-15 — End: 2020-02-15

## 2020-02-15 MED ORDER — SODIUM CHLORIDE 0.9% FLUSH
10.0000 mL | INTRAVENOUS | Status: DC | PRN
  Administered 2020-02-15: 10 mL
  Filled 2020-02-15: qty 10

## 2020-02-15 MED ORDER — HEPARIN SOD (PORK) LOCK FLUSH 100 UNIT/ML IV SOLN
500.0000 [IU] | Freq: Once | INTRAVENOUS | Status: AC | PRN
  Administered 2020-02-15: 500 [IU]
  Filled 2020-02-15: qty 5

## 2020-02-15 MED ORDER — POTASSIUM CHLORIDE 10 MEQ/100ML IV SOLN
10.0000 meq | Freq: Once | INTRAVENOUS | Status: DC
  Filled 2020-02-15: qty 100

## 2020-02-15 MED FILL — POTASSIUM CHLORIDE CRYS ER: 20 | 30 days supply | Qty: 90 | Fill #0

## 2020-02-15 MED FILL — DIPHENOXYLATE-ATROPINE 2.5-: 2.5-0.025 | 25 days supply | Qty: 100 | Fill #0

## 2020-02-15 NOTE — Patient Instructions (Signed)

## 2020-02-15 NOTE — Telephone Encounter (Signed)
Dr. Marin Olp notified of potassium 2.9. Order received for pt to get 10 meq of IV potassium today per Dr. Marin Olp.

## 2020-02-15 NOTE — Telephone Encounter (Signed)
Palliative care consult called to [facility] at [facility] per order of Dr. Marin Olp.

## 2020-02-15 NOTE — Patient Instructions (Signed)

## 2020-02-15 NOTE — Progress Notes (Signed)
Hematology and Oncology Follow Up Visit  Taylor Elliott [id number] 1950-12-19 69 y.o. 02/15/2020   Principle Diagnosis:   Extensive stage small cell lung cancer  SIADH secondary to small cell lung cancer  Current Therapy:           Carboplatinum/etoposide/Tecentriq-cycle #6  Atezolizumab 1200 mg IV q 3 week -- maintenance -  Start 01/21/2019  XRT for CNS mets -- completed on 03/27/2019  Lurbinectedin -- s/p cycle #4-- started on 05/07/2019 -- d/c on 09/03/2019 due to progression  CDDP/Irinotecan -- s/p cycle #3- started on 09/11/2019 -- d/c on 12/16/2019  Taxotere -- s/p cycle #2 on 11/30/2019     Interim History:  Taylor Elliott is back for an unscheduled visit.  Unfortunately, he seems to be declining.  He is falling more at home.  I worry about him having more in the way of brain metastasis.  He does have a MRI set up in early August.  We will have to move this up to this week.  He is scheduled for a PET scan this week.  I have to believe that his malignancy is progressing.  He just is behaving as if there is progressive disease.  When I listen to his lungs, his right lung is much more congested.  He is weaker.  His potassium is quite low.  It is 2.9.  He is having diarrhea.  I will send in some Lomotil for him.  We are getting to the point where we may have to think about comfort issues.  I talked to he and his wife about getting hospice and palliative care involved at home.  His wife told me, in private, that he wants to be home when all lives "said and done."  The IV fluids are helping.  His sodium is 144.  The SIADH is not much of a problem right now.  He is on demeclocycline.  His appetite is down a little bit.  Overall, I would say his performance status is ECOG 2.      Medications:  Current Outpatient Medications:  .  amLODipine (NORVASC) 10 MG tablet, Take 10 mg by mouth daily. (Patient not taking: Reported on 01/12/2020), Disp: , Rfl:  .  aspirin EC 81 MG tablet,  Take 81 mg by mouth daily., Disp: , Rfl:  .  calcium carbonate (TUMS - DOSED IN MG ELEMENTAL CALCIUM) 500 MG chewable tablet, Chew 2 tablets by mouth daily as needed for indigestion or heartburn. , Disp: , Rfl:  .  ciprofloxacin-dexamethasone (CIPRODEX) OTIC suspension, Place 5 drops into both ears as needed., Disp: , Rfl:  .  demeclocycline (DECLOMYCIN) 150 MG tablet, TAKE 2 TABLETS BY MOUTH TWICE DAILY, Disp: 120 tablet, Rfl: 2 .  diphenoxylate-atropine (LOMOTIL) 2.5-0.025 MG tablet, Take 1 tablet by mouth 4 (four) times daily as needed for diarrhea or loose stools., Disp: 100 tablet, Rfl: 0 .  dronabinol (MARINOL) 5 MG capsule, Take 1 capsule (5 mg total) by mouth 2 (two) times daily before a meal., Disp: 60 capsule, Rfl: 1 .  EPINEPHrine 0.3 mg/0.3 mL IJ SOAJ injection, Inject 0.3 mg into the muscle once., Disp: , Rfl:  .  fenofibrate 160 MG tablet, TAKE 1 TABLET (160 MG TOTAL) BY MOUTH DAILY., Disp: 90 tablet, Rfl: 1 .  HYDROcodone-homatropine (HYCODAN) 5-1.5 MG/5ML syrup, TAKE 5 MLS BY MOUTH EVERY 4 (FOUR) HOURS AS NEEDED FOR COUGH., Disp: 300 mL, Rfl: 0 .  ipratropium-albuterol (DUONEB) 0.5-2.5 (3) MG/3ML SOLN, Take 3 mLs by nebulization every  6 (six) hours as needed., Disp: 360 mL, Rfl: 6 .  lactulose (CHRONULAC) 10 GM/15ML solution, Take 15 mLs (10 g total) by mouth 2 (two) times daily as needed for mild constipation., Disp: 473 mL, Rfl: 1 .  lidocaine-prilocaine (EMLA) cream, Apply topically once., Disp: , Rfl:  .  LORazepam (ATIVAN) 1 MG tablet, Take 1 tablet (1 mg total) by mouth every 6 (six) hours as needed for anxiety., Disp: 60 tablet, Rfl: 0 .  Multiple Vitamin (MULTIVITAMIN) tablet, Take 1 tablet by mouth daily., Disp: , Rfl:  .  potassium chloride SA (KLOR-CON) 20 MEQ tablet, Take 1 tablet (20 mEq total) by mouth daily. Take 40 meq (2 tablets) by mouth on day 1 and then 20 meq (1 tablet) by mouth daily., Disp: 15 tablet, Rfl: 0 .  pyridoxine (B-6) 200 MG tablet, Take 200 mg by mouth  daily., Disp: , Rfl:  .  solifenacin (VESICARE) 10 MG tablet, Take 10 mg by mouth every evening., Disp: , Rfl:  .  temazepam (RESTORIL) 30 MG capsule, TAKE 1 CAPSULE (30 MG TOTAL) BY MOUTH AT BEDTIME AS NEEDED FOR SLEEP., Disp: 30 capsule, Rfl: 2 .  triamcinolone (NASACORT) 55 MCG/ACT AERO nasal inhaler, , Disp: , Rfl:  .  UNABLE TO FIND, Med Name: Allergy shots., Disp: , Rfl:  No current facility-administered medications for this visit.  Facility-Administered Medications Ordered in Other Visits:  .  heparin lock flush 100 unit/mL, 500 Units, Intracatheter, Once PRN, [place], Sarah M, NP .  sodium chloride flush (NS) 0.9 % injection 10 mL, 10 mL, Intracatheter, PRN, [place], Holli Humbles, NP  Allergies:  Allergies  Allergen Reactions  . Bee Venom Anaphylaxis  . Clindamycin/Lincomycin Hives    Past Medical History, Surgical history, Social history, and Family History were reviewed and updated.  Review of Systems: Review of Systems  Constitutional: Negative.   HENT:  Negative.   Eyes: Negative.   Respiratory: Positive for cough.   Cardiovascular: Negative.   Gastrointestinal: Negative.   Endocrine: Negative.   Genitourinary: Negative.    Musculoskeletal: Positive for back pain, myalgias and neck pain.  Skin: Positive for itching and rash.  Neurological: Negative.   Hematological: Negative.   Psychiatric/Behavioral: Negative.     Physical Exam:  weight is 191 lb (86.6 kg). His oral temperature is 98.2 F (36.8 C). His blood pressure is 160/82 (abnormal) and his pulse is 82. His respiration is 18 and oxygen saturation is 99%.   Wt Readings from Last 3 Encounters:  02/15/20 191 lb (86.6 kg)  01/12/20 191 lb 6.4 oz (86.8 kg)  12/24/19 193 lb (87.5 kg)    Physical Exam Vitals reviewed.  HENT:     Head: Normocephalic and atraumatic.  Eyes:     Pupils: Pupils are equal, round, and reactive to light.  Cardiovascular:     Rate and Rhythm: Normal rate and regular rhythm.      Heart sounds: Normal heart sounds.  Pulmonary:     Effort: Pulmonary effort is normal.     Breath sounds: Normal breath sounds.  Abdominal:     General: Bowel sounds are normal.     Palpations: Abdomen is soft.     Comments: Abdominal exam shows a slightly obese abdomen.  He does have an umbilical hernia.  I really cannot palpate his liver at this point.  There is no fluid wave.  There is no inguinal adenopathy.  There is no splenomegaly.    Musculoskeletal:  General: No tenderness or deformity. Normal range of motion.     Cervical back: Normal range of motion.  Lymphadenopathy:     Cervical: No cervical adenopathy.  Skin:    General: Skin is warm and dry.     Findings: No erythema or rash.  Neurological:     Mental Status: He is alert and oriented to person, place, and time.  Psychiatric:        Behavior: Behavior normal.        Thought Content: Thought content normal.        Judgment: Judgment normal.      Lab Results  Component Value Date   WBC 9.3 02/15/2020   HGB 10.5 (L) 02/15/2020   HCT 33.1 (L) 02/15/2020   MCV 100.9 (H) 02/15/2020   PLT 110 (L) 02/15/2020     Chemistry      Component Value Date/Time   NA 144 02/15/2020 0835   NA 135 (L) 07/12/2015 0813   K 2.9 (LL) 02/15/2020 0835   K 4.5 07/12/2015 0813   CL 101 02/15/2020 0835   CO2 33 (H) 02/15/2020 0835   CO2 24 07/12/2015 0813   BUN 9 02/15/2020 0835   BUN 15.7 07/12/2015 0813   CREATININE 1.04 02/15/2020 0835   CREATININE 1.6 (H) 07/12/2015 0813      Component Value Date/Time   CALCIUM 9.3 02/15/2020 0835   CALCIUM 10.1 07/12/2015 0813   ALKPHOS 150 (H) 02/15/2020 0835   ALKPHOS 115 07/12/2015 0813   AST 29 02/15/2020 0835   AST 22 07/12/2015 0813   ALT 30 02/15/2020 0835   ALT 24 07/12/2015 0813   BILITOT 0.4 02/15/2020 0835   BILITOT 0.46 07/12/2015 0813       Impression and Plan: Mr. Timmons is a 69 year old white male.  He has a past history of renal cell carcinoma and also  prostate cancer.  Now, he has a third malignancy.  This is metastatic small cell lung cancer.  It is recurrent.  Again, I have to believe that his malignancy is progressing again.  He just is behaving as if it is progressing.  I do worry about the possibility of CNS progression.  I really think that hospice is what we are are going to have to get for him ultimately.  For right now, we will see about them providing some help at home.  The scans will clearly show [place] where we are headed.  I know he comes in weekly for IV fluids.  I think I have an appointment with him next week.  We will keep that appointment.  I spent about 45 minutes with he and his wife today.  This was somewhat complex.  It was certainly quite emotional.  I know Mr. Lewin has done a great job.  I know he is trying his best.  His wife has been such a blessing to him.  She has been so motivated to help him.  I want to make sure that we help her at this stage.    Volanda Napoleon, MD 7/19/20219:53 AM

## 2020-02-16 ENCOUNTER — Telehealth: Payer: Self-pay | Admitting: Hematology & Oncology

## 2020-02-16 NOTE — Telephone Encounter (Signed)
No los 7/19

## 2020-02-17 ENCOUNTER — Ambulatory Visit (HOSPITAL_COMMUNITY)
Admission: RE | Admit: 2020-02-17 | Discharge: 2020-02-17 | Disposition: A | Discharge status: Home / Self Care | Payer: 59 | Source: Ambulatory Visit | Attending: Radiation Oncology | Admitting: Radiation Oncology

## 2020-02-17 ENCOUNTER — Other Ambulatory Visit: Payer: Self-pay

## 2020-02-17 DIAGNOSIS — C7949 Secondary malignant neoplasm of other parts of nervous system: Secondary | ICD-10-CM | POA: Insufficient documentation

## 2020-02-17 DIAGNOSIS — I614 Nontraumatic intracerebral hemorrhage in cerebellum: Secondary | ICD-10-CM | POA: Diagnosis not present

## 2020-02-17 DIAGNOSIS — G319 Degenerative disease of nervous system, unspecified: Secondary | ICD-10-CM | POA: Diagnosis not present

## 2020-02-17 DIAGNOSIS — M2548 Effusion, other site: Secondary | ICD-10-CM | POA: Diagnosis not present

## 2020-02-17 DIAGNOSIS — G930 Cerebral cysts: Secondary | ICD-10-CM | POA: Diagnosis not present

## 2020-02-17 DIAGNOSIS — C7931 Secondary malignant neoplasm of brain: Secondary | ICD-10-CM | POA: Insufficient documentation

## 2020-02-17 MED ORDER — GADOBUTROL 1 MMOL/ML IV SOLN
8.0000 mL | Freq: Once | INTRAVENOUS | Status: AC | PRN
  Administered 2020-02-17: 8 mL via INTRAVENOUS

## 2020-02-18 ENCOUNTER — Encounter (HOSPITAL_COMMUNITY)
Admission: RE | Admit: 2020-02-18 | Discharge: 2020-02-18 | Disposition: A | Discharge status: Home / Self Care | Payer: 59 | Source: Ambulatory Visit | Attending: Hematology & Oncology | Admitting: Hematology & Oncology

## 2020-02-18 ENCOUNTER — Telehealth: Payer: Self-pay | Admitting: Internal Medicine

## 2020-02-18 DIAGNOSIS — C3491 Malignant neoplasm of unspecified part of right bronchus or lung: Secondary | ICD-10-CM | POA: Insufficient documentation

## 2020-02-18 DIAGNOSIS — C3490 Malignant neoplasm of unspecified part of unspecified bronchus or lung: Secondary | ICD-10-CM | POA: Diagnosis not present

## 2020-02-18 LAB — GLUCOSE, CAPILLARY: Glucose-Capillary: 174 mg/dL — ABNORMAL HIGH (ref 70–99)

## 2020-02-18 NOTE — Telephone Encounter (Signed)
Spoke with both patient and wife regarding Palliative services and all questions were answered and they were in agreement with scheduling visit.  I have scheduled an In-person Consult for 02/22/20 @ 3 PM

## 2020-02-22 ENCOUNTER — Other Ambulatory Visit: Payer: Self-pay | Admitting: Internal Medicine

## 2020-02-22 ENCOUNTER — Other Ambulatory Visit: Payer: Self-pay

## 2020-02-22 ENCOUNTER — Telehealth: Payer: Self-pay | Admitting: *Deleted

## 2020-02-22 ENCOUNTER — Encounter: Payer: Self-pay | Admitting: Hematology & Oncology

## 2020-02-22 ENCOUNTER — Inpatient Hospital Stay: Payer: 59

## 2020-02-22 ENCOUNTER — Inpatient Hospital Stay (HOSPITAL_BASED_OUTPATIENT_CLINIC_OR_DEPARTMENT_OTHER): Payer: 59 | Admitting: Hematology & Oncology

## 2020-02-22 ENCOUNTER — Other Ambulatory Visit: Payer: Self-pay | Admitting: *Deleted

## 2020-02-22 ENCOUNTER — Inpatient Hospital Stay: Payer: 59 | Admitting: Hematology & Oncology

## 2020-02-22 VITALS — BP 153/94 | HR 84 | Temp 98.4°F | Resp 18

## 2020-02-22 DIAGNOSIS — Z85528 Personal history of other malignant neoplasm of kidney: Secondary | ICD-10-CM | POA: Diagnosis not present

## 2020-02-22 DIAGNOSIS — C7931 Secondary malignant neoplasm of brain: Secondary | ICD-10-CM | POA: Diagnosis not present

## 2020-02-22 DIAGNOSIS — C3491 Malignant neoplasm of unspecified part of right bronchus or lung: Secondary | ICD-10-CM

## 2020-02-22 DIAGNOSIS — F17210 Nicotine dependence, cigarettes, uncomplicated: Secondary | ICD-10-CM | POA: Diagnosis not present

## 2020-02-22 DIAGNOSIS — L03119 Cellulitis of unspecified part of limb: Secondary | ICD-10-CM

## 2020-02-22 DIAGNOSIS — E222 Syndrome of inappropriate secretion of antidiuretic hormone: Secondary | ICD-10-CM | POA: Diagnosis not present

## 2020-02-22 DIAGNOSIS — Z5111 Encounter for antineoplastic chemotherapy: Secondary | ICD-10-CM | POA: Diagnosis not present

## 2020-02-22 DIAGNOSIS — Z95828 Presence of other vascular implants and grafts: Secondary | ICD-10-CM

## 2020-02-22 DIAGNOSIS — Z1589 Genetic susceptibility to other disease: Secondary | ICD-10-CM | POA: Diagnosis not present

## 2020-02-22 DIAGNOSIS — Z6828 Body mass index (BMI) 28.0-28.9, adult: Secondary | ICD-10-CM | POA: Diagnosis not present

## 2020-02-22 DIAGNOSIS — C787 Secondary malignant neoplasm of liver and intrahepatic bile duct: Secondary | ICD-10-CM | POA: Diagnosis not present

## 2020-02-22 DIAGNOSIS — E876 Hypokalemia: Secondary | ICD-10-CM | POA: Diagnosis not present

## 2020-02-22 DIAGNOSIS — Z8546 Personal history of malignant neoplasm of prostate: Secondary | ICD-10-CM | POA: Diagnosis not present

## 2020-02-22 DIAGNOSIS — R05 Cough: Secondary | ICD-10-CM | POA: Diagnosis not present

## 2020-02-22 LAB — CMP (CANCER CENTER ONLY)
ALT: 100 U/L — ABNORMAL HIGH (ref 0–44)
AST: 53 U/L — ABNORMAL HIGH (ref 15–41)
Albumin: 3.8 g/dL (ref 3.5–5.0)
Alkaline Phosphatase: 199 U/L — ABNORMAL HIGH (ref 38–126)
Anion gap: 10 (ref 5–15)
BUN: 24 mg/dL — ABNORMAL HIGH (ref 8–23)
CO2: 31 mmol/L (ref 22–32)
Calcium: 10.4 mg/dL — ABNORMAL HIGH (ref 8.9–10.3)
Chloride: 101 mmol/L (ref 98–111)
Creatinine: 1.26 mg/dL — ABNORMAL HIGH (ref 0.61–1.24)
GFR, Est AFR Am: >60 mL/min (ref >60)
GFR, Estimated: 59 mL/min — ABNORMAL LOW (ref >60)
Glucose, Bld: 208 mg/dL — ABNORMAL HIGH (ref 70–99)
Potassium: 4.1 mmol/L (ref 3.5–5.1)
Sodium: 142 mmol/L (ref 135–145)
Total Bilirubin: 0.5 mg/dL (ref 0.3–1.2)
Total Protein: 6 g/dL — ABNORMAL LOW (ref 6.5–8.1)

## 2020-02-22 LAB — CBC WITH DIFFERENTIAL (CANCER CENTER ONLY)
Abs Immature Granulocytes: 0.15 10*3/uL — ABNORMAL HIGH (ref 0.00–0.07)
Basophils Absolute: 0 10*3/uL (ref 0.0–0.1)
Basophils Relative: 0 %
Eosinophils Absolute: 0 10*3/uL (ref 0.0–0.5)
Eosinophils Relative: 0 %
HCT: 37.1 % — ABNORMAL LOW (ref 39.0–52.0)
Hemoglobin: 11.6 g/dL — ABNORMAL LOW (ref 13.0–17.0)
Immature Granulocytes: 1 %
Lymphocytes Relative: 2 %
Lymphs Abs: 0.3 10*3/uL — ABNORMAL LOW (ref 0.7–4.0)
MCH: 32 pg (ref 26.0–34.0)
MCHC: 31.3 g/dL (ref 30.0–36.0)
MCV: 102.5 fL — ABNORMAL HIGH (ref 80.0–100.0)
Monocytes Absolute: 0.7 10*3/uL (ref 0.1–1.0)
Monocytes Relative: 6 %
Neutro Abs: 11.4 10*3/uL — ABNORMAL HIGH (ref 1.7–7.7)
Neutrophils Relative %: 91 %
Platelet Count: 147 10*3/uL — ABNORMAL LOW (ref 150–400)
RBC: 3.62 MIL/uL — ABNORMAL LOW (ref 4.22–5.81)
RDW: 17.1 % — ABNORMAL HIGH (ref 11.5–15.5)
WBC Count: 12.6 10*3/uL — ABNORMAL HIGH (ref 4.0–10.5)
nRBC: 0 % (ref 0.0–0.2)

## 2020-02-22 LAB — LACTATE DEHYDROGENASE: LDH: 635 U/L — ABNORMAL HIGH (ref 98–192)

## 2020-02-22 MED ORDER — SODIUM CHLORIDE 0.9% FLUSH
10.0000 mL | Freq: Once | INTRAVENOUS | Status: AC
  Administered 2020-02-22: 10 mL via INTRAVENOUS
  Filled 2020-02-22: qty 10

## 2020-02-22 MED ORDER — HYDROMORPHONE HCL 1 MG/ML IJ SOLN
INTRAMUSCULAR | Status: AC
  Filled 2020-02-22: qty 3

## 2020-02-22 MED ORDER — HYDROMORPHONE HCL 1 MG/ML IJ SOLN
3.0000 mg | Freq: Once | INTRAMUSCULAR | Status: AC
  Administered 2020-02-22: 3 mg via INTRAVENOUS

## 2020-02-22 MED ORDER — SODIUM CHLORIDE 0.9 % IV SOLN
20.0000 mg | Freq: Once | INTRAVENOUS | Status: AC
  Administered 2020-02-22: 20 mg via INTRAVENOUS
  Filled 2020-02-22: qty 20

## 2020-02-22 MED ORDER — HEPARIN SOD (PORK) LOCK FLUSH 100 UNIT/ML IV SOLN
500.0000 [IU] | Freq: Once | INTRAVENOUS | Status: AC | PRN
  Administered 2020-02-22: 500 [IU]
  Filled 2020-02-22: qty 5

## 2020-02-22 MED ORDER — SODIUM CHLORIDE 0.9% FLUSH
10.0000 mL | INTRAVENOUS | Status: DC | PRN
  Administered 2020-02-22: 10 mL
  Filled 2020-02-22: qty 10

## 2020-02-22 MED ORDER — OXYCODONE HCL 20 MG/ML PO CONC: 10.0000 mg | ORAL | 0 refills | Status: AC | PRN

## 2020-02-22 MED ORDER — FENTANYL 25 MCG/HR TD PT72: 1.0000 | MEDICATED_PATCH | TRANSDERMAL | 0 refills | Status: AC

## 2020-02-22 MED FILL — FENTANYL 25 MCG/HR PT72: 25 | 30 days supply | Qty: 10 | Fill #0

## 2020-02-22 MED FILL — oxyCODONE HCL 100 MG/5ML CO: 100 | 20 days supply | Qty: 60 | Fill #0

## 2020-02-22 NOTE — Progress Notes (Signed)
.  ho
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
## 2020-02-22 NOTE — Progress Notes (Signed)
Hematology and Oncology Follow Up Visit  Taylor Elliott [id number] 11-Feb-1951 69 y.o. 02/22/2020   Principle Diagnosis:   Extensive stage small cell lung cancer  SIADH secondary to small cell lung cancer  Current Therapy:           Carboplatinum/etoposide/Tecentriq-cycle #6  Atezolizumab 1200 mg IV q 3 week -- maintenance -  Start 01/21/2019  XRT for CNS mets -- completed on 03/27/2019  Lurbinectedin -- s/p cycle #4-- started on 05/07/2019 -- d/c on 09/03/2019 due to progression  CDDP/Irinotecan -- s/p cycle #3- started on 09/11/2019 -- d/c on 12/16/2019  Taxotere -- s/p cycle #2 on 11/30/2019     Interim History:  Taylor Elliott is back for what is likely his last visit.  This is truly a very sad day for all of [place] in the office.  It is clear that his cancer is now progressing quickly.  He had a MRI and a PET scan done last week.  The MRI of the brain shows new lesions within the brain.  The PET scan shows clear progression of disease in the liver, lymph nodes, bones.  He has lost weight.  He is not eating as much.  His wife is doing a fantastic job with him.  However, she definitely needs hospice to help.  We have called hospice already.  They will come and see him this afternoon.  He is falling more.  This is no surprise given the brain mets and the progression of his disease.  What I am surprised by the fact that he does not have SIADH.  At this point, I suspect that we probably looking at no more than 3 weeks.  Again he really has declined since we saw him with just a week ago.  He just I think has very little reserve left.  Again our goal is quality of life and comfort care.  We will put him on a Duragesic patch at 25 mcg.  We will also have OxyFast elixir to try to help with pain flareups.  It would not surprise me if we had to ultimately get him over to [facility] if his wife cannot handle him at home.  If he falls, he is like dead weight and she cannot pick him up and I  do not want to see her get hurt.  I know that hospice will do a fantastic job with him and his family.  I just hate the fact that he is declining quickly.  He has been dealing with this extensive stage small cell lung cancer now for a year and a half.  He is done incredibly well.  When we first saw him he has such extensive disease.  However, he responded incredibly well to treatment and had a wonderful quality of life where he and his family could enjoyed themselves.  Recently, his wife took him up to see his father [place] state.  This really was a godsend that he could see his dad.  Currently, I would have to say that his performance status is probably ECOG 3 at best.        Medications:  Current Outpatient Medications:  .  amLODipine (NORVASC) 10 MG tablet, Take 10 mg by mouth daily. (Patient not taking: Reported on 01/12/2020), Disp: , Rfl:  .  aspirin EC 81 MG tablet, Take 81 mg by mouth daily., Disp: , Rfl:  .  calcium carbonate (TUMS - DOSED IN MG ELEMENTAL CALCIUM) 500 MG chewable tablet, Chew 2  tablets by mouth daily as needed for indigestion or heartburn. , Disp: , Rfl:  .  ciprofloxacin-dexamethasone (CIPRODEX) OTIC suspension, Place 5 drops into both ears as needed., Disp: , Rfl:  .  demeclocycline (DECLOMYCIN) 150 MG tablet, TAKE 2 TABLETS BY MOUTH TWICE DAILY, Disp: 120 tablet, Rfl: 2 .  diphenoxylate-atropine (LOMOTIL) 2.5-0.025 MG tablet, Take 1 tablet by mouth 4 (four) times daily as needed for diarrhea or loose stools., Disp: 100 tablet, Rfl: 0 .  dronabinol (MARINOL) 5 MG capsule, Take 1 capsule (5 mg total) by mouth 2 (two) times daily before a meal., Disp: 60 capsule, Rfl: 1 .  EPINEPHrine 0.3 mg/0.3 mL IJ SOAJ injection, Inject 0.3 mg into the muscle once., Disp: , Rfl:  .  fentaNYL (DURAGESIC) 25 MCG/HR, Place 1 patch onto the skin every 3 (three) days., Disp: 10 patch, Rfl: 0 .  HYDROcodone-homatropine (HYCODAN) 5-1.5 MG/5ML syrup, TAKE 5 MLS BY MOUTH EVERY 4 (FOUR)  HOURS AS NEEDED FOR COUGH., Disp: 300 mL, Rfl: 0 .  ipratropium-albuterol (DUONEB) 0.5-2.5 (3) MG/3ML SOLN, Take 3 mLs by nebulization every 6 (six) hours as needed., Disp: 360 mL, Rfl: 6 .  lactulose (CHRONULAC) 10 GM/15ML solution, Take 15 mLs (10 g total) by mouth 2 (two) times daily as needed for mild constipation., Disp: 473 mL, Rfl: 1 .  lidocaine-prilocaine (EMLA) cream, Apply topically once., Disp: , Rfl:  .  LORazepam (ATIVAN) 1 MG tablet, Take 1 tablet (1 mg total) by mouth every 6 (six) hours as needed for anxiety., Disp: 60 tablet, Rfl: 0 .  Multiple Vitamin (MULTIVITAMIN) tablet, Take 1 tablet by mouth daily., Disp: , Rfl:  .  oxyCODONE (ROXICODONE INTENSOL) 20 MG/ML concentrated solution, Take 0.5 mLs (10 mg total) by mouth every 4 (four) hours as needed for severe pain., Disp: 60 mL, Rfl: 0 .  solifenacin (VESICARE) 10 MG tablet, Take 10 mg by mouth every evening., Disp: , Rfl:  .  temazepam (RESTORIL) 30 MG capsule, TAKE 1 CAPSULE (30 MG TOTAL) BY MOUTH AT BEDTIME AS NEEDED FOR SLEEP., Disp: 30 capsule, Rfl: 2 .  triamcinolone (NASACORT) 55 MCG/ACT AERO nasal inhaler, , Disp: , Rfl:  .  UNABLE TO FIND, Med Name: Allergy shots., Disp: , Rfl:  No current facility-administered medications for this visit.  Facility-Administered Medications Ordered in Other Visits:  .  sodium chloride flush (NS) 0.9 % injection 10 mL, 10 mL, Intracatheter, PRN, [place], Sarah M, NP, 10 mL at 02/22/20 1159  Allergies:  Allergies  Allergen Reactions  . Bee Venom Anaphylaxis  . Clindamycin/Lincomycin Hives    Past Medical History, Surgical history, Social history, and Family History were reviewed and updated.  Review of Systems: Review of Systems  Constitutional: Negative.   HENT:  Negative.   Eyes: Negative.   Respiratory: Positive for cough.   Cardiovascular: Negative.   Gastrointestinal: Negative.   Endocrine: Negative.   Genitourinary: Negative.    Musculoskeletal: Positive for back  pain, myalgias and neck pain.  Skin: Positive for itching and rash.  Neurological: Negative.   Hematological: Negative.   Psychiatric/Behavioral: Negative.     Physical Exam:  oral temperature is 98.4 F (36.9 C). His blood pressure is 153/94 (abnormal) and his pulse is 84. His respiration is 18 and oxygen saturation is 98%.   Wt Readings from Last 3 Encounters:  02/15/20 191 lb (86.6 kg)  01/12/20 191 lb 6.4 oz (86.8 kg)  12/24/19 193 lb (87.5 kg)    Physical Exam Vitals reviewed.  HENT:  Head: Normocephalic and atraumatic.  Eyes:     Pupils: Pupils are equal, round, and reactive to light.  Cardiovascular:     Rate and Rhythm: Normal rate and regular rhythm.     Heart sounds: Normal heart sounds.  Pulmonary:     Effort: Pulmonary effort is normal.     Breath sounds: Normal breath sounds.  Abdominal:     General: Bowel sounds are normal.     Palpations: Abdomen is soft.     Comments: Abdominal exam shows a slightly obese abdomen.  He does have an umbilical hernia.  I really cannot palpate his liver at this point.  There is no fluid wave.  There is no inguinal adenopathy.  There is no splenomegaly.    Musculoskeletal:        General: No tenderness or deformity. Normal range of motion.     Cervical back: Normal range of motion.  Lymphadenopathy:     Cervical: No cervical adenopathy.  Skin:    General: Skin is warm and dry.     Findings: No erythema or rash.  Neurological:     Mental Status: He is alert and oriented to person, place, and time.  Psychiatric:        Behavior: Behavior normal.        Thought Content: Thought content normal.        Judgment: Judgment normal.      Lab Results  Component Value Date   WBC 12.6 (H) 02/22/2020   HGB 11.6 (L) 02/22/2020   HCT 37.1 (L) 02/22/2020   MCV 102.5 (H) 02/22/2020   PLT 147 (L) 02/22/2020     Chemistry      Component Value Date/Time   NA 142 02/22/2020 0915   NA 135 (L) 07/12/2015 0813   K 4.1 02/22/2020  0915   K 4.5 07/12/2015 0813   CL 101 02/22/2020 0915   CO2 31 02/22/2020 0915   CO2 24 07/12/2015 0813   BUN 24 (H) 02/22/2020 0915   BUN 15.7 07/12/2015 0813   CREATININE 1.26 (H) 02/22/2020 0915   CREATININE 1.6 (H) 07/12/2015 0813      Component Value Date/Time   CALCIUM 10.4 (H) 02/22/2020 0915   CALCIUM 10.1 07/12/2015 0813   ALKPHOS 199 (H) 02/22/2020 0915   ALKPHOS 115 07/12/2015 0813   AST 53 (H) 02/22/2020 0915   AST 22 07/12/2015 0813   ALT 100 (H) 02/22/2020 0915   ALT 24 07/12/2015 0813   BILITOT 0.5 02/22/2020 0915   BILITOT 0.46 07/12/2015 0813       Impression and Plan: Taylor Elliott is a 69 year old white male.  He has a past history of renal cell carcinoma and also prostate cancer.  Now, he has a third malignancy.  This is metastatic small cell lung cancer.  It is recurrent.  At this point, we will continue him on the IV fluids today.  I will give him some IV Dilaudid (3 mg) and IV Decadron (20 mg) to try to help with pain and maybe to help with any type of swallowing from CNS metastasis.  Again hospice will come out today.  We called them.  Hopefully they will be able to bring a hospital bed to help.  Again, this is such a sad day for [place].  The nurses really like Taylor Elliott.  He has been so kind and generous to them.  He has been a true aspiration to all of [place].  I know that this world is  a better place because of Taylor Elliott.  It has been a true privilege to be able to be a small part of his life to try to help him out.  I just hate the fact that we will not see him again.  I just think it would be very difficult for him to make it out to our office.  With hospice, they can let us know what is going on with him at home.    Volanda Napoleon, MD 7/26/202112:18 PM

## 2020-02-22 NOTE — Telephone Encounter (Signed)
Call placed to [facility] to place referral for hospice and not palliative care.  Informed Alwyn Ren that Dr. Marin Olp believes that pt has approx three weeks to live, needs someone from hospice out to see him today and that pt will need hospital bed and bedside commode.  Alwyn Ren states that a nurse will be out to see patient today at 4:00PM.  Dr. Marin Olp notified.

## 2020-02-22 NOTE — Patient Instructions (Signed)

## 2020-02-23 ENCOUNTER — Telehealth: Payer: Self-pay | Admitting: Hematology & Oncology

## 2020-02-23 DIAGNOSIS — Z8546 Personal history of malignant neoplasm of prostate: Secondary | ICD-10-CM | POA: Diagnosis not present

## 2020-02-23 DIAGNOSIS — Z85528 Personal history of other malignant neoplasm of kidney: Secondary | ICD-10-CM | POA: Diagnosis not present

## 2020-02-23 DIAGNOSIS — C7931 Secondary malignant neoplasm of brain: Secondary | ICD-10-CM | POA: Diagnosis not present

## 2020-02-23 DIAGNOSIS — C787 Secondary malignant neoplasm of liver and intrahepatic bile duct: Secondary | ICD-10-CM | POA: Diagnosis not present

## 2020-02-23 DIAGNOSIS — C3491 Malignant neoplasm of unspecified part of right bronchus or lung: Secondary | ICD-10-CM | POA: Diagnosis not present

## 2020-02-23 DIAGNOSIS — Z6828 Body mass index (BMI) 28.0-28.9, adult: Secondary | ICD-10-CM | POA: Diagnosis not present

## 2020-02-23 NOTE — Telephone Encounter (Signed)
No los 7/26

## 2020-02-24 DIAGNOSIS — C3491 Malignant neoplasm of unspecified part of right bronchus or lung: Secondary | ICD-10-CM | POA: Diagnosis not present

## 2020-02-24 DIAGNOSIS — Z6828 Body mass index (BMI) 28.0-28.9, adult: Secondary | ICD-10-CM | POA: Diagnosis not present

## 2020-02-24 DIAGNOSIS — C7931 Secondary malignant neoplasm of brain: Secondary | ICD-10-CM | POA: Diagnosis not present

## 2020-02-24 DIAGNOSIS — Z85528 Personal history of other malignant neoplasm of kidney: Secondary | ICD-10-CM | POA: Diagnosis not present

## 2020-02-24 DIAGNOSIS — C787 Secondary malignant neoplasm of liver and intrahepatic bile duct: Secondary | ICD-10-CM | POA: Diagnosis not present

## 2020-02-24 DIAGNOSIS — Z8546 Personal history of malignant neoplasm of prostate: Secondary | ICD-10-CM | POA: Diagnosis not present

## 2020-02-25 ENCOUNTER — Ambulatory Visit: Payer: 59 | Admitting: Orthopedic Surgery

## 2020-02-25 DIAGNOSIS — Z6828 Body mass index (BMI) 28.0-28.9, adult: Secondary | ICD-10-CM | POA: Diagnosis not present

## 2020-02-25 DIAGNOSIS — Z85528 Personal history of other malignant neoplasm of kidney: Secondary | ICD-10-CM | POA: Diagnosis not present

## 2020-02-25 DIAGNOSIS — C7931 Secondary malignant neoplasm of brain: Secondary | ICD-10-CM | POA: Diagnosis not present

## 2020-02-25 DIAGNOSIS — C787 Secondary malignant neoplasm of liver and intrahepatic bile duct: Secondary | ICD-10-CM | POA: Diagnosis not present

## 2020-02-25 DIAGNOSIS — C3491 Malignant neoplasm of unspecified part of right bronchus or lung: Secondary | ICD-10-CM | POA: Diagnosis not present

## 2020-02-25 DIAGNOSIS — Z8546 Personal history of malignant neoplasm of prostate: Secondary | ICD-10-CM | POA: Diagnosis not present

## 2020-02-26 ENCOUNTER — Telehealth: Payer: Self-pay

## 2020-02-26 ENCOUNTER — Inpatient Hospital Stay: Payer: 59

## 2020-02-26 DIAGNOSIS — C787 Secondary malignant neoplasm of liver and intrahepatic bile duct: Secondary | ICD-10-CM | POA: Diagnosis not present

## 2020-02-26 DIAGNOSIS — C7931 Secondary malignant neoplasm of brain: Secondary | ICD-10-CM | POA: Diagnosis not present

## 2020-02-26 DIAGNOSIS — Z6828 Body mass index (BMI) 28.0-28.9, adult: Secondary | ICD-10-CM | POA: Diagnosis not present

## 2020-02-26 DIAGNOSIS — Z8546 Personal history of malignant neoplasm of prostate: Secondary | ICD-10-CM | POA: Diagnosis not present

## 2020-02-26 DIAGNOSIS — C3491 Malignant neoplasm of unspecified part of right bronchus or lung: Secondary | ICD-10-CM | POA: Diagnosis not present

## 2020-02-26 DIAGNOSIS — Z85528 Personal history of other malignant neoplasm of kidney: Secondary | ICD-10-CM | POA: Diagnosis not present

## 2020-02-26 NOTE — Telephone Encounter (Signed)
Patients wife called asking for a letter for her FMLA to be continuous instead of intermittent. States she called and they informed her all she needed was a letter from our office stating patient went to continuous care starting 02/16/20 and continuing to 03/18/20 and was put on hospice. Leave number [id number], fax to [phone]. Letter completed, signed by MD and faxed.Called Velva Harman to inform her letter has been faxed.

## 2020-02-27 DIAGNOSIS — C7931 Secondary malignant neoplasm of brain: Secondary | ICD-10-CM | POA: Diagnosis not present

## 2020-02-27 DIAGNOSIS — Z8546 Personal history of malignant neoplasm of prostate: Secondary | ICD-10-CM | POA: Diagnosis not present

## 2020-02-27 DIAGNOSIS — C3491 Malignant neoplasm of unspecified part of right bronchus or lung: Secondary | ICD-10-CM | POA: Diagnosis not present

## 2020-02-27 DIAGNOSIS — Z6828 Body mass index (BMI) 28.0-28.9, adult: Secondary | ICD-10-CM | POA: Diagnosis not present

## 2020-02-27 DIAGNOSIS — Z85528 Personal history of other malignant neoplasm of kidney: Secondary | ICD-10-CM | POA: Diagnosis not present

## 2020-02-27 DIAGNOSIS — C787 Secondary malignant neoplasm of liver and intrahepatic bile duct: Secondary | ICD-10-CM | POA: Diagnosis not present

## 2020-02-28 DIAGNOSIS — C7931 Secondary malignant neoplasm of brain: Secondary | ICD-10-CM | POA: Diagnosis not present

## 2020-02-28 DIAGNOSIS — Z85528 Personal history of other malignant neoplasm of kidney: Secondary | ICD-10-CM | POA: Diagnosis not present

## 2020-02-28 DIAGNOSIS — Z6828 Body mass index (BMI) 28.0-28.9, adult: Secondary | ICD-10-CM | POA: Diagnosis not present

## 2020-02-28 DIAGNOSIS — C3491 Malignant neoplasm of unspecified part of right bronchus or lung: Secondary | ICD-10-CM | POA: Diagnosis not present

## 2020-02-28 DIAGNOSIS — Z8546 Personal history of malignant neoplasm of prostate: Secondary | ICD-10-CM | POA: Diagnosis not present

## 2020-02-28 DIAGNOSIS — C787 Secondary malignant neoplasm of liver and intrahepatic bile duct: Secondary | ICD-10-CM | POA: Diagnosis not present

## 2020-02-29 ENCOUNTER — Encounter: Payer: Self-pay | Admitting: *Deleted

## 2020-02-29 NOTE — Progress Notes (Signed)
Fax received from [facility] to inform Dr. Marin Olp that patient passed away on March 26, 2020 at 12:14AM.  Dr. Marin Olp notified.

## 2020-03-02 ENCOUNTER — Encounter: Payer: Self-pay | Admitting: Family Medicine

## 2020-03-02 ENCOUNTER — Telehealth: Payer: Self-pay | Admitting: Family Medicine

## 2020-03-03 ENCOUNTER — Ambulatory Visit: Payer: 59 | Admitting: Physician Assistant

## 2020-03-03 NOTE — Telephone Encounter (Signed)
Called wife to check on her She is doing well under the circumstances Offered condolences

## 2020-03-04 ENCOUNTER — Other Ambulatory Visit (HOSPITAL_COMMUNITY): Payer: 59

## 2020-03-07 ENCOUNTER — Ambulatory Visit: Payer: Self-pay | Admitting: Radiation Oncology

## 2020-03-14 ENCOUNTER — Other Ambulatory Visit: Payer: 59

## 2020-03-14 ENCOUNTER — Ambulatory Visit: Payer: 59

## 2020-03-14 ENCOUNTER — Ambulatory Visit: Payer: 59 | Admitting: Hematology & Oncology

## 2020-03-30 DEATH — deceased

## 2020-05-12 ENCOUNTER — Ambulatory Visit (INDEPENDENT_AMBULATORY_CARE_PROVIDER_SITE_OTHER): Payer: 59 | Admitting: Otolaryngology

## 2020-07-19 ENCOUNTER — Encounter (INDEPENDENT_AMBULATORY_CARE_PROVIDER_SITE_OTHER): Payer: 59 | Admitting: Ophthalmology

## 2021-02-21 IMAGING — DX DG CHEST 2V
2 series · 2 of 2 positions shown · non-contrast
Comparison: PET CT scan 09/02/2018. CT chest 08/06/2018.
Single-view of the chest 07/06/2018.

CLINICAL DATA: History of lung cancer. Worsening shortness of
breath last night.

EXAM:
CHEST - 2 VIEW

[chest pa]
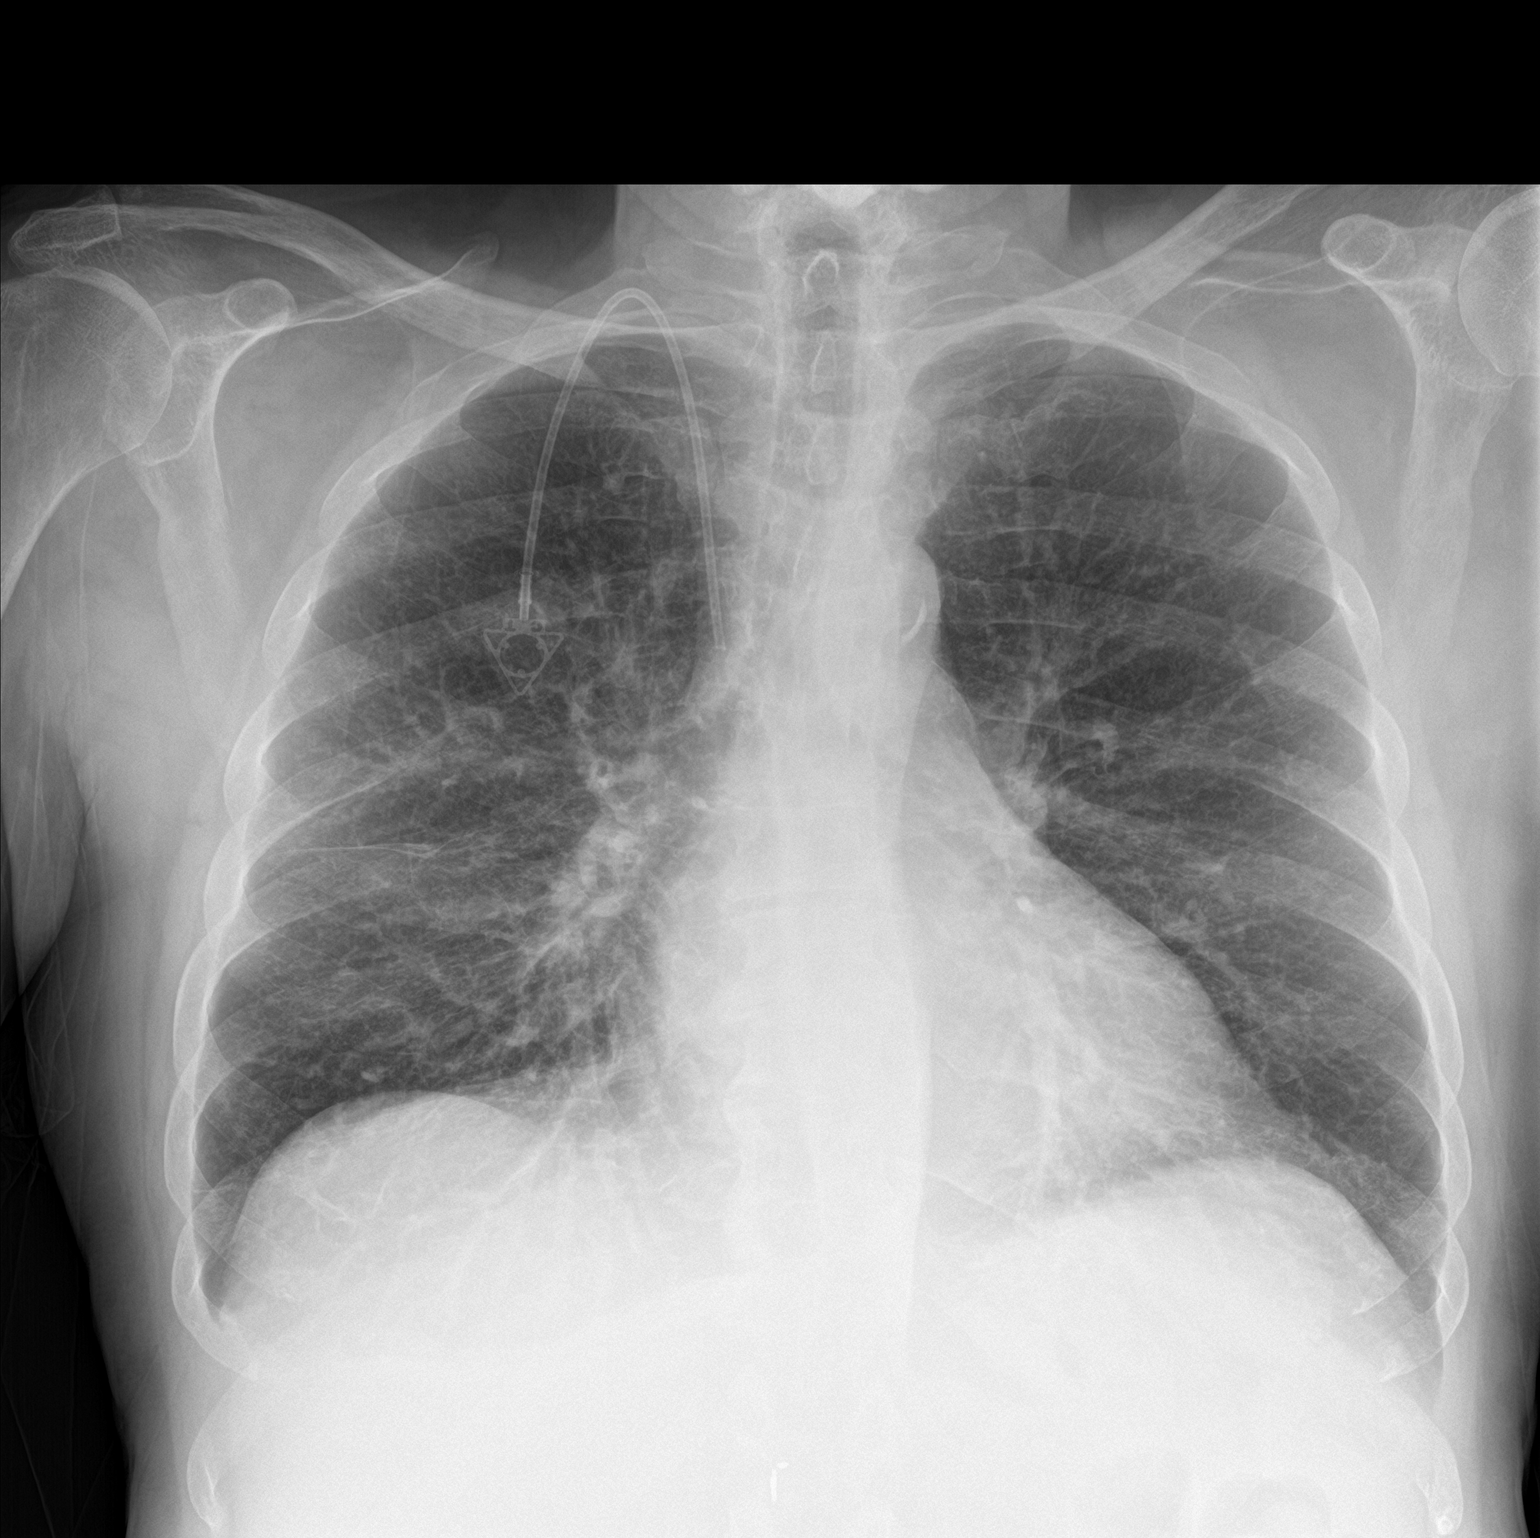

[chest lat]
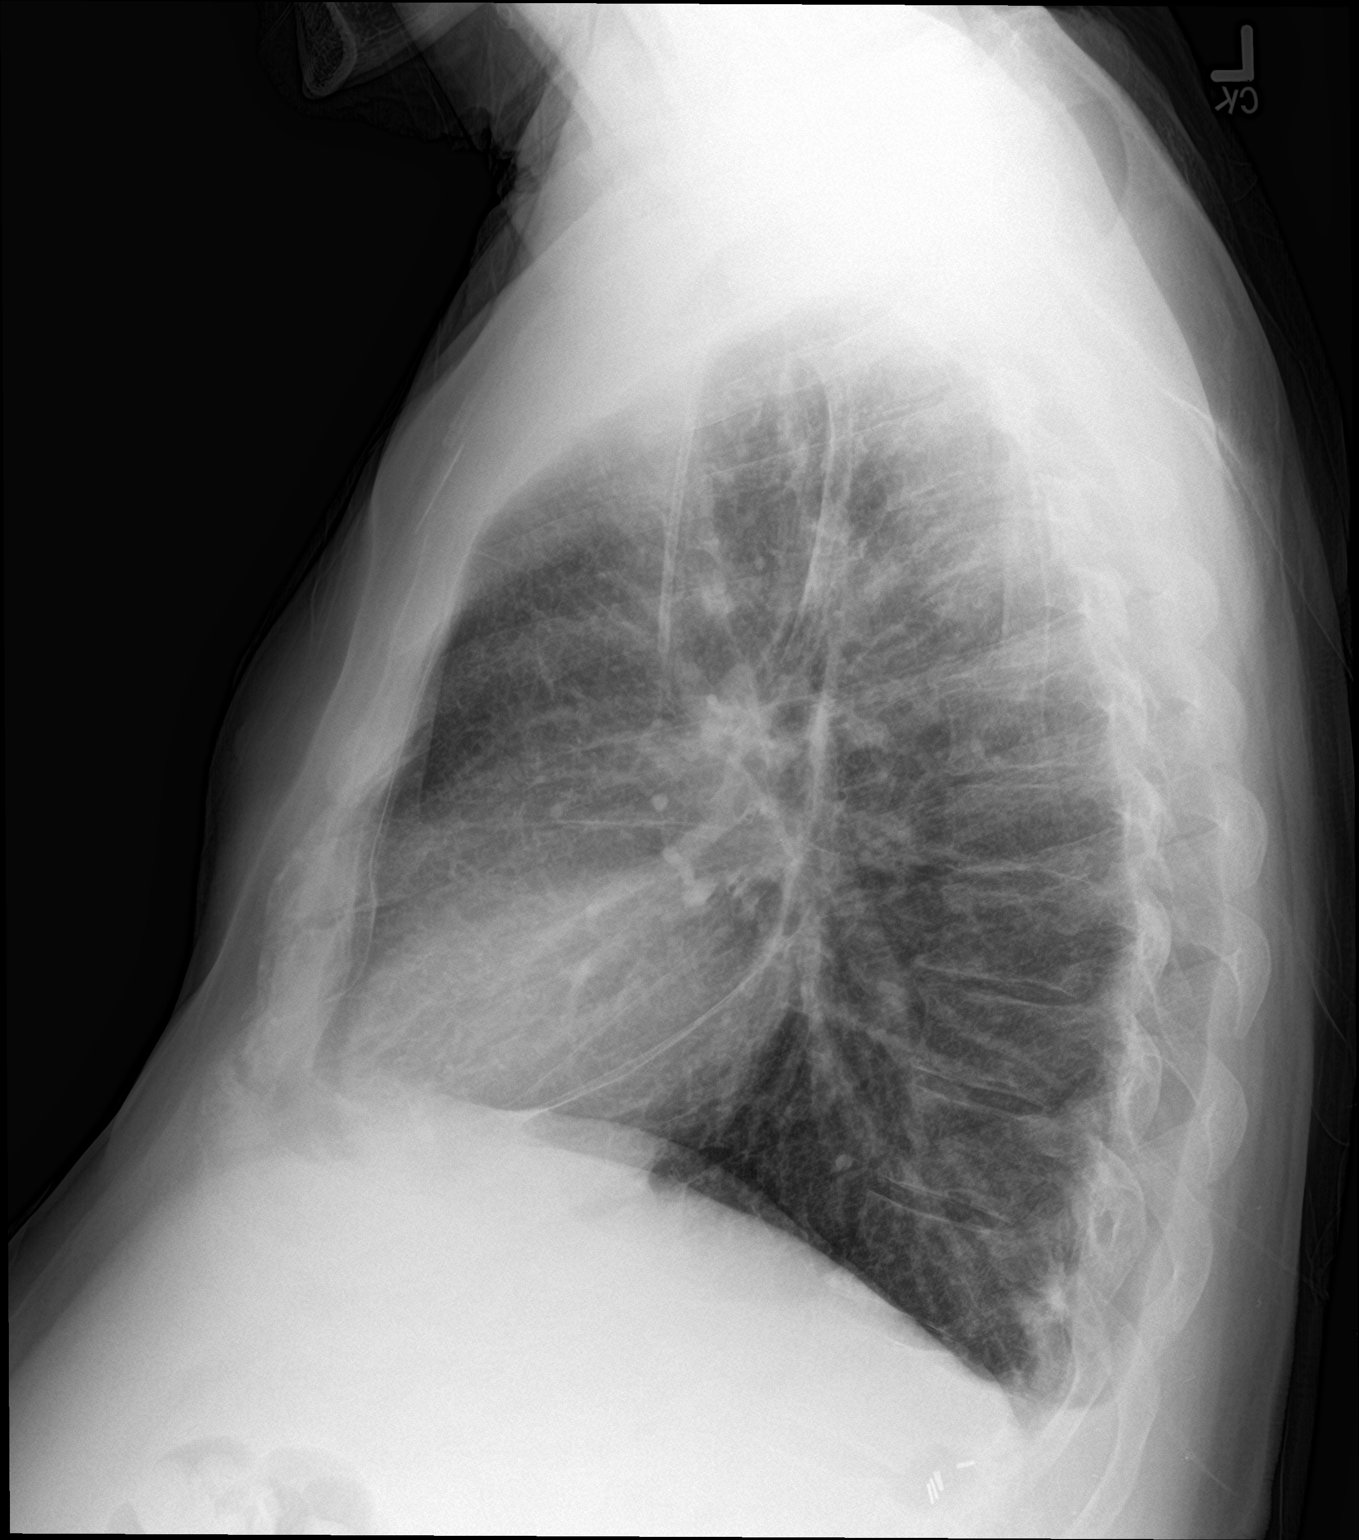

[2 of 2 positions shown; findings below may reference images not displayed]

FINDINGS: The lungs are emphysematous. Fullness of the right hilum is
consistent with the patient's known hilar mass. Subtle nodular
opacities in the right upper lobe are unchanged compared to the
prior CT. No consolidative process, pneumothorax or effusion. Heart
size is normal. Atherosclerosis noted. No acute or focal bony
abnormality.
IMPRESSION: No acute disease.

Emphysema.

Fullness of the right hilum and subtle nodular opacities in the
right upper lobe correlate with findings on recent PET CT scan and
the patient's history of lung cancer.

## 2021-03-14 IMAGING — PT NUCLEAR MEDICINE PET IMAGE RESTAGING (PS) SKULL BASE TO THIGH
1 of 8 series · 3 of 16 positions shown, 4 images · non-contrast
Comparison: 09/02/2018

CLINICAL DATA: Subsequent treatment strategy for small-cell lung
cancer.

EXAM:
NUCLEAR MEDICINE PET SKULL BASE TO THIGH
TECHNIQUE: 9.6 mCi F-18 FDG was injected intravenously. Full-ring PET imaging
was performed from the skull base to thigh after the radiotracer. CT
data was obtained and used for attenuation correction and anatomic
localization.
Fasting blood glucose: 100 mg/dl

[Series 4: ct sk_thigh 5.0 b31f · axial · 0.98mm/px · z∈[-790,+134]mm · 3 of 232 slices shown, 4 images]
[im 1/232  soft-tissue]
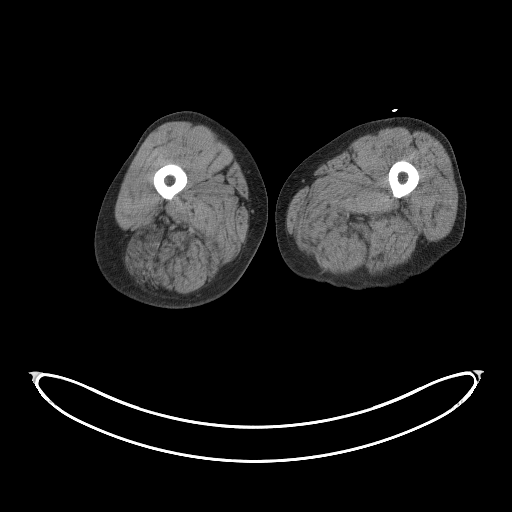
[im 1/232  bone]
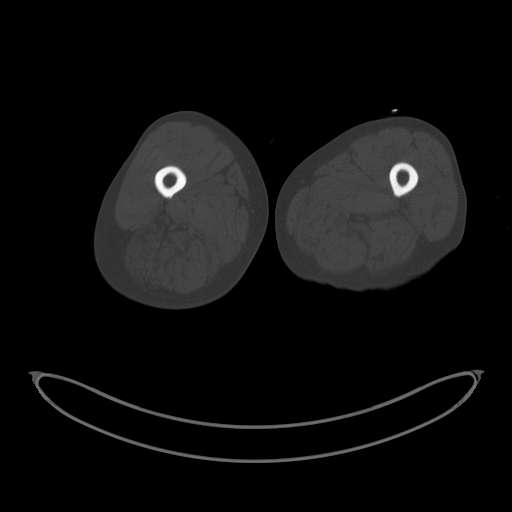
[im 116/232  soft-tissue]
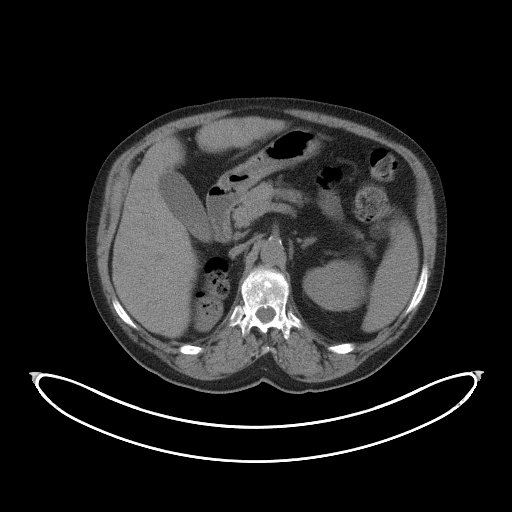
[im 232/232  soft-tissue]
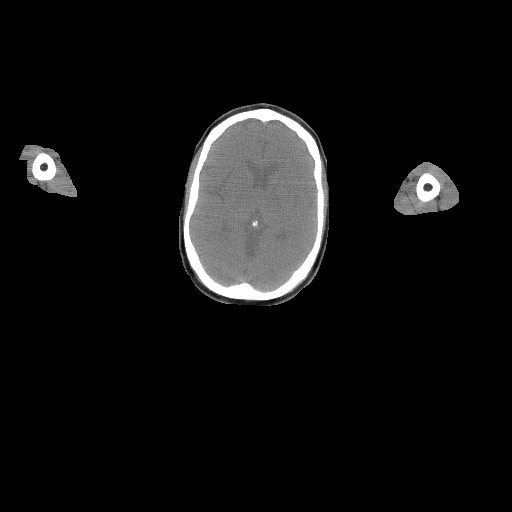

[3 of 16 positions shown; findings below may reference images not displayed]

FINDINGS: Mediastinal blood pool activity: SUV max

NECK: No hypermetabolic lymph nodes in the neck.

Incidental CT findings: none

CHEST: The hypermetabolic lesion seen previously in the right hilum
has decreased substantially in hypermetabolism in the interval. SUV
max = 5.4 today compared to 12.9 previously. CT imaging shows
similar marked decrease in abnormal soft tissue in the right hilum.

The mildly hypermetabolic segmental tiny nodule seen in the superior
segment right lower lobe on the previous study have also clearly
decreased in the interval. SUV max = 1.2 today compared to
previously.

The hypermetabolic subcarinal lymph node seen previously has
resolved. This lymph node measured 12 mm short axis previously
compared to 5 mm short axis today. No residual hypermetabolism above
blood pool activity evident on today's exam.

No new hypermetabolic soft tissue disease in the chest.

Incidental CT findings: Right Port-A-Cath tip is positioned at the
SVC/RA junction. Atherosclerotic calcification is noted in the wall
of the thoracic aorta.

ABDOMEN/PELVIS: The near confluent hypermetabolic disease in the
liver seen previously shows marked interval decrease. Index region
measured on the prior study in the posterior right liver
demonstrated SUV max = 11.0. Same region today demonstrates SUV max
= 4.0. Index area in the left liver demonstrating SUV max =
previously now demonstrates SUV max = 4.4. Liver has decreased in
volume in the interval although nodular contour persist.

Low level FDG uptake in the gastric fundus likely physiologic. No
new suspicious hypermetabolic soft tissue disease in the abdomen or
pelvis on today's exam.

Incidental CT findings: Evidence of prior right nephrectomy. There
is abdominal aortic atherosclerosis without aneurysm. Paraumbilical
hernia contains only fat, stable.

SKELETON: As seen elsewhere, the multiple hypermetabolic skeletal
metastases seen on the previous study have decreased, but assessment
is hindered by the diffuse increased accumulation of radiotracer
within the marrow space, likely reflecting marrow stimulatory
effects. The multiple discrete measured hypermetabolic bone
metastases on the prior study can not be identified above background
marrow activity on the current exam.

Incidental CT findings: Similar appearance of diffusely
heterogeneous osseous mineralization.
IMPRESSION: 1. Marked interval response to therapy. No new or progressive
hypermetabolic disease on today's study.
2. Right hilar disease shows substantial decrease in hypermetabolism
and similar significant decrease in abnormal soft tissue.
3. Hypermetabolic subcarinal lymphadenopathy has resolved.
4. Decrease in hypermetabolism and prominence of the segmental
nodularity seen previously in the right lower lobe. Activity in this
region is now below blood pool.
5. The diffuse/confluent hypermetabolic liver metastases show marked
reduction in hypermetabolism with interval decrease in size of the
liver.
6. No discrete hypermetabolic bony hypermetabolic lesions above
background diffuse marrow uptake likely reflecting marrow
stimulation.
